# Patient Record
Sex: Female | Born: 1960
Health system: Southern US, Community
[De-identification: ages and names within clinical notes are randomized; demographics above are authoritative.]

## PROBLEM LIST (undated history)

## (undated) DIAGNOSIS — R002 Palpitations: Secondary | ICD-10-CM

## (undated) DIAGNOSIS — M1990 Unspecified osteoarthritis, unspecified site: Secondary | ICD-10-CM

## (undated) DIAGNOSIS — K219 Gastro-esophageal reflux disease without esophagitis: Secondary | ICD-10-CM

## (undated) DIAGNOSIS — C50911 Malignant neoplasm of unspecified site of right female breast: Secondary | ICD-10-CM

## (undated) DIAGNOSIS — Z923 Personal history of irradiation: Secondary | ICD-10-CM

## (undated) DIAGNOSIS — J45909 Unspecified asthma, uncomplicated: Secondary | ICD-10-CM

## (undated) DIAGNOSIS — F32A Depression, unspecified: Secondary | ICD-10-CM

## (undated) DIAGNOSIS — I890 Lymphedema, not elsewhere classified: Secondary | ICD-10-CM

## (undated) DIAGNOSIS — J42 Unspecified chronic bronchitis: Secondary | ICD-10-CM

## (undated) DIAGNOSIS — D649 Anemia, unspecified: Secondary | ICD-10-CM

## (undated) DIAGNOSIS — F419 Anxiety disorder, unspecified: Secondary | ICD-10-CM

## (undated) DIAGNOSIS — Z803 Family history of malignant neoplasm of breast: Secondary | ICD-10-CM

## (undated) DIAGNOSIS — Z9221 Personal history of antineoplastic chemotherapy: Secondary | ICD-10-CM

## (undated) DIAGNOSIS — Z8049 Family history of malignant neoplasm of other genital organs: Secondary | ICD-10-CM

## (undated) DIAGNOSIS — Z9289 Personal history of other medical treatment: Secondary | ICD-10-CM

## (undated) DIAGNOSIS — F329 Major depressive disorder, single episode, unspecified: Secondary | ICD-10-CM

## (undated) DIAGNOSIS — Z8042 Family history of malignant neoplasm of prostate: Secondary | ICD-10-CM

## (undated) DIAGNOSIS — M549 Dorsalgia, unspecified: Secondary | ICD-10-CM

## (undated) HISTORY — DX: Family history of malignant neoplasm of other genital organs: Z80.49

## (undated) HISTORY — DX: Palpitations: R00.2

## (undated) HISTORY — DX: Dorsalgia, unspecified: M54.9

## (undated) HISTORY — DX: Family history of malignant neoplasm of prostate: Z80.42

## (undated) HISTORY — DX: Family history of malignant neoplasm of breast: Z80.3

## (undated) HISTORY — PX: MYOMECTOMY: SHX85

## (undated) HISTORY — PX: ENDOMETRIAL ABLATION: SHX621

## (undated) HISTORY — DX: Lymphedema, not elsewhere classified: I89.0

## (undated) HISTORY — PX: DILATION AND CURETTAGE OF UTERUS: SHX78

---

## 1998-01-27 ENCOUNTER — Emergency Department (HOSPITAL_COMMUNITY): Admission: EM | Admit: 1998-01-27 | Discharge: 1998-01-27 | Payer: Self-pay | Admitting: Emergency Medicine

## 1998-05-24 ENCOUNTER — Ambulatory Visit (HOSPITAL_COMMUNITY)
Admission: RE | Admit: 1998-05-24 | Discharge: 1998-05-24 | Payer: Self-pay | Admitting: Physical Medicine and Rehabilitation

## 1998-07-19 ENCOUNTER — Ambulatory Visit (HOSPITAL_COMMUNITY)
Admission: RE | Admit: 1998-07-19 | Discharge: 1998-07-19 | Payer: Self-pay | Admitting: Physical Medicine and Rehabilitation

## 1998-07-19 ENCOUNTER — Encounter: Payer: Self-pay | Admitting: Physical Medicine and Rehabilitation

## 2000-05-07 ENCOUNTER — Encounter: Admission: RE | Admit: 2000-05-07 | Discharge: 2000-05-07 | Payer: Self-pay

## 2001-09-09 ENCOUNTER — Encounter: Payer: Self-pay | Admitting: Emergency Medicine

## 2001-09-09 ENCOUNTER — Encounter: Admission: RE | Admit: 2001-09-09 | Discharge: 2001-09-09 | Payer: Self-pay | Admitting: Emergency Medicine

## 2003-02-20 ENCOUNTER — Emergency Department (HOSPITAL_COMMUNITY): Admission: EM | Admit: 2003-02-20 | Discharge: 2003-02-20 | Payer: Self-pay | Admitting: Emergency Medicine

## 2003-02-23 ENCOUNTER — Other Ambulatory Visit: Admission: RE | Admit: 2003-02-23 | Discharge: 2003-02-23 | Payer: Self-pay | Admitting: Obstetrics and Gynecology

## 2004-05-14 ENCOUNTER — Ambulatory Visit: Payer: Self-pay | Admitting: Nurse Practitioner

## 2004-06-24 ENCOUNTER — Ambulatory Visit: Payer: Self-pay | Admitting: *Deleted

## 2004-10-03 ENCOUNTER — Ambulatory Visit: Payer: Self-pay | Admitting: Nurse Practitioner

## 2004-11-05 ENCOUNTER — Ambulatory Visit: Payer: Self-pay | Admitting: Internal Medicine

## 2004-11-06 ENCOUNTER — Ambulatory Visit: Payer: Self-pay | Admitting: Nurse Practitioner

## 2004-12-04 ENCOUNTER — Ambulatory Visit: Payer: Self-pay | Admitting: Nurse Practitioner

## 2004-12-20 ENCOUNTER — Ambulatory Visit: Payer: Self-pay | Admitting: Family Medicine

## 2005-01-02 ENCOUNTER — Ambulatory Visit: Payer: Self-pay | Admitting: Family Medicine

## 2005-01-09 ENCOUNTER — Encounter: Admission: RE | Admit: 2005-01-09 | Discharge: 2005-01-09 | Payer: Self-pay | Admitting: Internal Medicine

## 2005-01-24 ENCOUNTER — Encounter: Admission: RE | Admit: 2005-01-24 | Discharge: 2005-01-24 | Payer: Self-pay | Admitting: Internal Medicine

## 2005-06-27 ENCOUNTER — Ambulatory Visit: Payer: Self-pay | Admitting: Nurse Practitioner

## 2005-12-10 ENCOUNTER — Encounter: Payer: Self-pay | Admitting: Physical Medicine and Rehabilitation

## 2005-12-25 ENCOUNTER — Ambulatory Visit: Payer: Self-pay | Admitting: Nurse Practitioner

## 2005-12-29 ENCOUNTER — Ambulatory Visit (HOSPITAL_COMMUNITY): Admission: RE | Admit: 2005-12-29 | Discharge: 2005-12-29 | Payer: Self-pay | Admitting: Nurse Practitioner

## 2006-01-02 ENCOUNTER — Ambulatory Visit: Payer: Self-pay | Admitting: Nurse Practitioner

## 2006-01-07 ENCOUNTER — Other Ambulatory Visit: Admission: RE | Admit: 2006-01-07 | Discharge: 2006-01-07 | Payer: Self-pay | Admitting: *Deleted

## 2006-01-07 ENCOUNTER — Encounter (INDEPENDENT_AMBULATORY_CARE_PROVIDER_SITE_OTHER): Payer: Self-pay | Admitting: Specialist

## 2006-01-07 ENCOUNTER — Ambulatory Visit: Payer: Self-pay | Admitting: *Deleted

## 2006-01-21 ENCOUNTER — Ambulatory Visit: Payer: Self-pay | Admitting: Obstetrics and Gynecology

## 2006-01-27 ENCOUNTER — Ambulatory Visit: Payer: Self-pay | Admitting: Nurse Practitioner

## 2006-01-29 ENCOUNTER — Ambulatory Visit: Payer: Self-pay | Admitting: Obstetrics and Gynecology

## 2006-02-24 ENCOUNTER — Ambulatory Visit: Payer: Self-pay | Admitting: Internal Medicine

## 2006-03-06 ENCOUNTER — Ambulatory Visit: Payer: Self-pay | Admitting: Obstetrics and Gynecology

## 2006-03-06 ENCOUNTER — Ambulatory Visit (HOSPITAL_COMMUNITY): Admission: RE | Admit: 2006-03-06 | Discharge: 2006-03-06 | Payer: Self-pay | Admitting: Obstetrics and Gynecology

## 2006-04-02 ENCOUNTER — Encounter: Admission: RE | Admit: 2006-04-02 | Discharge: 2006-04-02 | Payer: Self-pay | Admitting: Family Medicine

## 2006-07-31 ENCOUNTER — Ambulatory Visit: Payer: Self-pay | Admitting: Nurse Practitioner

## 2006-09-21 ENCOUNTER — Ambulatory Visit: Payer: Self-pay | Admitting: Nurse Practitioner

## 2006-10-30 ENCOUNTER — Ambulatory Visit: Payer: Self-pay | Admitting: Nurse Practitioner

## 2006-12-08 ENCOUNTER — Ambulatory Visit: Payer: Self-pay | Admitting: Nurse Practitioner

## 2007-01-26 ENCOUNTER — Ambulatory Visit: Payer: Self-pay | Admitting: Family Medicine

## 2007-02-24 ENCOUNTER — Encounter (INDEPENDENT_AMBULATORY_CARE_PROVIDER_SITE_OTHER): Payer: Self-pay | Admitting: Nurse Practitioner

## 2007-02-24 ENCOUNTER — Ambulatory Visit: Payer: Self-pay | Admitting: Internal Medicine

## 2007-03-01 ENCOUNTER — Ambulatory Visit (HOSPITAL_COMMUNITY): Admission: RE | Admit: 2007-03-01 | Discharge: 2007-03-01 | Payer: Self-pay | Admitting: Nurse Practitioner

## 2007-03-31 ENCOUNTER — Ambulatory Visit: Payer: Self-pay | Admitting: Obstetrics and Gynecology

## 2007-04-08 ENCOUNTER — Ambulatory Visit (HOSPITAL_COMMUNITY): Admission: RE | Admit: 2007-04-08 | Discharge: 2007-04-08 | Payer: Self-pay | Admitting: Family Medicine

## 2007-04-23 ENCOUNTER — Ambulatory Visit: Payer: Self-pay | Admitting: Obstetrics & Gynecology

## 2007-04-28 ENCOUNTER — Ambulatory Visit: Payer: Self-pay | Admitting: Family Medicine

## 2007-04-28 ENCOUNTER — Encounter (INDEPENDENT_AMBULATORY_CARE_PROVIDER_SITE_OTHER): Payer: Self-pay | Admitting: *Deleted

## 2007-07-02 ENCOUNTER — Ambulatory Visit: Payer: Self-pay | Admitting: Family Medicine

## 2007-09-11 ENCOUNTER — Emergency Department (HOSPITAL_COMMUNITY): Admission: EM | Admit: 2007-09-11 | Discharge: 2007-09-11 | Payer: Self-pay | Admitting: Emergency Medicine

## 2007-09-17 ENCOUNTER — Ambulatory Visit: Payer: Self-pay | Admitting: Internal Medicine

## 2007-09-18 ENCOUNTER — Encounter (INDEPENDENT_AMBULATORY_CARE_PROVIDER_SITE_OTHER): Payer: Self-pay | Admitting: Nurse Practitioner

## 2007-12-01 ENCOUNTER — Ambulatory Visit: Payer: Self-pay | Admitting: Obstetrics and Gynecology

## 2008-02-15 ENCOUNTER — Ambulatory Visit: Payer: Self-pay | Admitting: Obstetrics & Gynecology

## 2008-04-05 ENCOUNTER — Ambulatory Visit: Payer: Self-pay | Admitting: Obstetrics and Gynecology

## 2008-04-05 ENCOUNTER — Encounter: Payer: Self-pay | Admitting: Obstetrics and Gynecology

## 2008-05-15 ENCOUNTER — Ambulatory Visit: Payer: Self-pay | Admitting: Internal Medicine

## 2008-11-03 ENCOUNTER — Ambulatory Visit: Payer: Self-pay | Admitting: Family Medicine

## 2009-02-27 ENCOUNTER — Ambulatory Visit: Payer: Self-pay | Admitting: Family Medicine

## 2009-03-12 ENCOUNTER — Ambulatory Visit (HOSPITAL_COMMUNITY): Admission: RE | Admit: 2009-03-12 | Discharge: 2009-03-12 | Payer: Self-pay | Admitting: Internal Medicine

## 2009-03-13 ENCOUNTER — Ambulatory Visit: Payer: Self-pay | Admitting: Internal Medicine

## 2009-03-21 ENCOUNTER — Ambulatory Visit (HOSPITAL_COMMUNITY): Admission: RE | Admit: 2009-03-21 | Discharge: 2009-03-21 | Payer: Self-pay | Admitting: Family Medicine

## 2009-03-21 ENCOUNTER — Ambulatory Visit: Payer: Self-pay | Admitting: Obstetrics and Gynecology

## 2009-03-21 ENCOUNTER — Encounter: Payer: Self-pay | Admitting: Physician Assistant

## 2009-04-10 ENCOUNTER — Ambulatory Visit: Payer: Self-pay | Admitting: Internal Medicine

## 2009-06-25 ENCOUNTER — Ambulatory Visit: Payer: Self-pay | Admitting: Obstetrics & Gynecology

## 2009-08-15 ENCOUNTER — Ambulatory Visit: Payer: Self-pay | Admitting: Family Medicine

## 2009-09-12 ENCOUNTER — Ambulatory Visit: Payer: Self-pay | Admitting: Obstetrics & Gynecology

## 2009-11-13 ENCOUNTER — Ambulatory Visit: Payer: Self-pay | Admitting: Family Medicine

## 2009-11-15 ENCOUNTER — Ambulatory Visit (HOSPITAL_COMMUNITY): Admission: RE | Admit: 2009-11-15 | Discharge: 2009-11-15 | Payer: Self-pay | Admitting: Internal Medicine

## 2010-03-22 ENCOUNTER — Ambulatory Visit (HOSPITAL_COMMUNITY): Admission: RE | Admit: 2010-03-22 | Discharge: 2010-03-22 | Payer: Self-pay | Admitting: Family Medicine

## 2010-09-01 ENCOUNTER — Encounter: Payer: Self-pay | Admitting: *Deleted

## 2010-09-01 ENCOUNTER — Encounter: Payer: Self-pay | Admitting: Internal Medicine

## 2010-09-02 ENCOUNTER — Encounter: Payer: Self-pay | Admitting: *Deleted

## 2010-12-24 NOTE — Group Therapy Note (Signed)
NAME:  Jacqueline Good, Jacqueline Good NO.:  [id number]   MEDICAL RECORD NO.:  [id number]          PATIENT TYPE:  WOC   LOCATION:  [facility]                   FACILITY:  [facility]   PHYSICIAN:  Argentina Donovan, MD        DATE OF BIRTH:  1960/12/15   DATE OF SERVICE:                                  CLINIC NOTE   HISTORY OF PRESENT ILLNESS:  The patient is a 50 year old gravida 2,  para 1-0-1-1 who last year was referred from [facility] because of  fibroids.  The patient has chronic severe disabling dysmenorrhea that in  talking with her sounds much more as if it were adenomyosis than related  to the fibroids.  Although, over the past year her fibroids have grown  somewhat.  She was scheduled for surgery last year and backed out  because of cost.  She has a fundal pedunculated fibroid about 5 cm in  diameter and left hydrosalpinx.  The uterus is about 10-12 week size and  easily movable.  The patient 1 year ago had an endometrial ablation  because of her bleeding and she has not had any bleeding since, but  cyclically every month, she has a severe disabling pain which I am sure  probably is from the adenomyosis.  We have talked to her in detail about  alternatives.  She seemed to get some good relief after the ablation  when we gave her double Lupron for 3 months, but over the past 6 months  the pain has returned and has been difficult to tolerate and really she  is unable to function during that few days each month.  I have told her  when she was ready to have the surgery we would just have her come in  and discuss, and we will go ahead and schedule her.  I think probably I  would schedule her with Dr. Mia Creek for laparoscopic-assisted vaginal  hysterectomy, possible abdominal hysterectomy.   IMPRESSION:  1. Chronic pelvic pain, cyclic in nature.  2. Uterine fibroids, ultrasound consistent, suggest adenomyosis.           ______________________________  Argentina Donovan, MD     PR/MEDQ  D:  03/31/2007  T:  04/01/2007  Job:  [id number]

## 2010-12-24 NOTE — Group Therapy Note (Signed)
NAME:  Jacqueline Good, MINIUM NO.:  [id number]   MEDICAL RECORD NO.:  [id number]          PATIENT TYPE:  WOC   LOCATION[facility]                   FACILITY:  [facility]   PHYSICIAN:  Argentina Donovan, MD        DATE OF BIRTH:  Sep 04, 1960   DATE OF SERVICE:  04/05/2008                                  CLINIC NOTE   The patient is a 50 year old gravida 2, para 1-0-1-1 who 2 years ago  underwent endometrial ablation for dysfunctional uterine bleeding.  Was  known to have some fibroids as well as the symptoms of adenomyosis,  which were suggested by her ultrasound.  She continued having some  heavier bleeding as well as severe pain following the procedure and was  placed on Depo-Provera, which has to some degree controlled the  bleeding.  She does continue to spot almost every day or every other  day, and every few months has severe pain.  I think that the only  answered to this lady to resolve her problem would be to do a  hysterectomy.  She is not ready to go through that at this time,  although at times she rates her pain a 9.  We have told her we will  continue the Depo-Provera.  She is a smoker so oral contraceptives are  out of the question, and she wanted something to control pain.  We are  putting her on generic Tramadol, generic Ultram because of the cheap  price that she can get it for at [employer].   EXAMINATION:  Her breasts are symmetrical with no dominant masses,  pendulant with no nipple discharge.  ABDOMEN:  Soft, flat, nontender.  No masses or organomegaly.  EXTERNAL GENITALIA:  Is normal.  BUS is within normal limits.  Vagina is  clean, well rugated.  Cervix clean, parous.  Uterus anterior, slightly  enlarged about 12 weeks size and somewhat irregular.  The adnexa could  not be outlined because of habitus of the patient.  She was 187 pounds  and 5 feet 6 inches tall.  I have told her that if she decides  hysterectomy [place] what she wants, to give [place] a call.  We will go  ahead and  think about getting this scheduled, and then she will come in for a  consultation when she is going to be scheduled.   IMPRESSION:  Chronic pelvic pain with vaginal spotting and probably  adenomyosis.  Pap smear was taken.  Mammogram has been ordered.           ______________________________  Argentina Donovan, MD     PR/MEDQ  D:  04/05/2008  T:  04/05/2008  Job:  [id number]

## 2010-12-24 NOTE — Group Therapy Note (Signed)
NAMEMarland Good  LACRETIA, TINDALL NO.:  [id number]   MEDICAL RECORD NO.:  [id number]          PATIENT TYPE:  WOC   [facility]ATION[facility]                   [facility]ACILITY:  [facility]   PHYSICIAN:  Argentina Donovan, MD        DATE OF BIRTH:  01-11-1961   DATE OF SERVICE:  12/01/2007                                  CLINIC NOTE   The patient is a 50 year old gravida 2, para 1-0-1-1, who recently was  referred to [place] because of fibroids and disabling dysmenorrhea.  Approximately a year ago, she had an endometrial ablation which cut back  on the heavy periods she was having and still has a uterus that measures  about 12 weeks size and is somewhat irregular with and pedunculated  fibroid.  This has not really enlarged much since we began seeing her.  She does not have the heavy bleeding anymore, but she still has the pain  and cramping every month when she would normally have a period.  Last  year, we gave her a shot of Depo-Provera which really helped, and I  think that we could probably continue that since she is uninsured and  does not really want to end up doing surgery.  She said she was  comfortable with that.  If we keep her on that until menopause, we are  probably going to at least alleviate her pain, unless the fibroids start  growing unexpectedly.  So, we are going to give her 150 mg of Depo-  Provera IM and continue that every 3 months unless her condition  warrants further intervention.   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           ______________________________  Argentina Donovan, MD     PR/MEDQ  D:  12/01/2007  T:  12/01/2007  Job:  [id number]

## 2010-12-27 NOTE — Group Therapy Note (Signed)
NAMEMarland Kitchen  Jacqueline Good, Jacqueline Good NO.:  [id number]   MEDICAL RECORD NO.:  [id number]          PATIENT TYPE:  WOC   LOCATION[facility]                   [facility]ACILITY[facility]   PHYSICIAN:  Tinnie Gens, MD        DATE OF BIRTH:  [birth year]   DATE OF SERVICE:                                    CLINIC NOTE   CHIEF COMPLAINT:  Fibroids.   HISTORY OF PRESENT ILLNESS:  The patient is a 50 year old gravida 2, para 1-  0-1-1 who is referred from [facility] for fibroids.  The patient states she  has known about the fibroids for approximately 3 years and that they were  small and sort of in the posterior cervix.  She reports that she has some  pain that was previously very severe, but it has been decreasing recently.  She notes the pain mostly right before her cycles and with her cycles.  Her  last Pap smear was in May of this year.  She is scheduled for a mammogram in  June of this year.  The patient reports that she still has monthly cycles.  She has got medium flow that lasts 5 days with severe pain.   PAST MEDICAL HISTORY:  Asthma, and allergic rhinitis.   PAST SURGICAL HISTORY:  Negative.   ALLERGIES:  None known.   MEDICATIONS:  Allegra, naproxen, and albuterol.   OBSTETRICAL HISTORY:  G2, P1, one vaginal delivery.   GYNECOLOGICAL HISTORY:  Menarche at age 66.  Cycles every month.  Condoms  for birth control.  No history of abnormal Pap.   FAMILY HISTORY:  Diabetes, coronary artery disease, hypertension, and lupus.   SOCIAL HISTORY:  One-pack-per-day smoker for the last 22 years, one 6 pack  per week of alcohol.  No other drug use.   REVIEW OF SYSTEMS:  A 14-point review of systems was reviewed, and is  positive for bruising, muscle aches, fatigue, problems with hearing in her  ears.   PHYSICAL EXAMINATION:  VITAL SIGNS:  Her vital signs are as noted on the  chart.  GENERAL:  She is a well-developed, well-nourished black female in no acute  distress.  ABDOMEN:  Soft,  nontender, nondistended.  GU:  She has normal external female genitalia.  The vagina is pink and  rugated.  The cervix is without lesion.  The uterus is firm, approximately 6-  8 weeks' size.  The adnexa were without mass or tenderness.   IMPRESSION:  Fibroid uterus.   PLAN:  Discussed endometrial biopsy with the patient today.  She is clearly  not ready for this.  We will revisit this discussion when she returns.  Pelvis ultrasound is scheduled to evaluate placement and size of fibroids  and a frank discussion about possible treatments.  The patient is interested  in uterine artery embolization.  However, she has no health insurance, and  may not be a good candidate for this as it costs 10,000 dollars.      TP/MEDQ  D:  01/02/2005  T:  01/02/2005  Job:  [id number]

## 2010-12-27 NOTE — Group Therapy Note (Signed)
NAME:  Jacqueline Good, Jacqueline Good NO.:  [id number]   MEDICAL RECORD NO.:  [id number]          PATIENT TYPE:  WOC   [facility]ATION[facility]                   [facility]ACILITY[facility]   PHYSICIAN:  Argentina Donovan, MD        DATE OF BIRTH:  02-14-1961   DATE OF SERVICE:  01/21/2006                                    CLINIC NOTE   The patient is a 50 year old, gravida 1, para 1-0-0-1, with a three-day  history of progressive dysmenorrhea and abnormal uterine bleeding. The last  time her blood count was checked her hemoglobin was 6.5 and hematocrit 25.  A pelvic ultrasound was normal except for some small fibroids. She came in  after having seeing Dr. Lauralee Evener for a consultation. He discussed with her  the possibility of having a hysterectomy. She wanted to discuss that. I told  her that she would get a probable diagnosis of adenomyosis and that it would  be very  unlikely that endometrial ablation would help. However, we will try  that for her and may control some of the bleeding, but it will not control  the pain, and that hysterectomy is probably the only certain reason it will  stop and the Depo-Lupron that he has ordered to give her as a trial has not  arrived as yet. The patient is wanting something done quickly. She is still  bleeding. She has only seasonal allergies.   She is on Allegra, Paxil, albuterol, and Naprosyn.   We will set her up for her hydroablation as soon as possible.           ______________________________  Argentina Donovan, MD     PR/MEDQ  D:  01/21/2006  T:  01/21/2006  Job:  [id number]

## 2010-12-27 NOTE — Op Note (Signed)
Jacqueline Good, RETTINGER               ACCOUNT NO.:  [id number]   MEDICAL RECORD NO.:  [id number]          PATIENT TYPE:  AMB   LOCATION:  [facility]                           [facility]   PHYSICIAN:  Phil D. Okey Dupre, M.D.     DATE OF BIRTH:  Jul 15, 1961   DATE OF PROCEDURE:  03/06/2006  DATE OF DISCHARGE:                                 OPERATIVE REPORT   PROCEDURE:  Hysteroscopy and hydroablation of the endometrium.   PREOPERATIVE DIAGNOSES:  Uterine fibroids, menometrorrhagia intractable, and  anemia.   SURGEON:  Javier Glazier. Okey Dupre, M.D.   ANESTHESIA:  General.   ESTIMATED BLOOD LOSS:  Negligible.   POSTOPERATIVE CONDITION:  Satisfactory.   PATHOLOGY SPECIMENS:  None.   POSTOPERATIVE CONDITION:  Satisfactory.   The procedure went as follows:   Under satisfactory general anesthesia with the patient in the dorsal  lithotomy position, the perineum and vagina were prepped and draped in the  usual sterile manner.  Bimanual pelvic examination under anesthesia revealed  the uterus was anterior, normal size, shape, consistency, with what felt  like a leiomyomata in the area of the right broad ligament measuring about 4  cm in diameter.  The adnexa could not be well outlined.  A weighted speculum  was placed in the posterior portion of the vagina through a marital  introitus.  BUS within normal limits.  Vagina was clean and well rugated.  The cervix was parous and clean.  The cervix came down midway to the  introitus when attached by a single-tooth tenaculum.  The uterine cavity was  sounded to a depth of 8 cm.  The cervical os dilated to a #23 Pratt dilator.  An 8-mm 30-degree hysteroscope attached to the hydroablation unit was  inserted to the fundus.  The entire uterine cavity was well explored.  Both  of the ostia were seen as well as submucous fibroids within the cavity.  The  instrument was checked for any leaks.  The protocol for hydroablation was  carried out.  Once we reached  temperature, a 10-minute ablation procedure  followed, followed by a 2-minute cooling off period.  The uterine cavity was  then re-explored and found to be completely blanched even behind the little  fibroid tumors which had shrunken down considerably.  The scope was then  removed, the tenaculum and speculum removed, and the patient transferred to  recovery room in satisfactory condition having tolerated the procedure well.           ______________________________  Javier Glazier. Okey Dupre, M.D.    PDR/MEDQ  D:  03/06/2006  T:  03/06/2006  Job:  [id number]

## 2010-12-27 NOTE — Group Therapy Note (Signed)
NAME:  Jacqueline Good, SALVO NO.:  [id number]   MEDICAL RECORD NO.:  [id number]          PATIENT TYPE:  WOC   [facility]ATION[facility]                   [facility]   PHYSICIAN:  Ellis Parents, MD    DATE OF BIRTH:  02-25-1961   DATE OF SERVICE:  01/07/2006                                    CLINIC NOTE   This 50 year old multiparous female has a three year history of progressive  dysmenorrhea and abnormal uterine bleeding of approximately six to eight  months duration. Her bleeding episodes have been particularly heavy lasting  six to seven days requiring double pads with the passage of clots. The  patient was seen in [facility] yesterday. Hemoglobin was 6.5 gm and hematocrit 25%.  Pelvic ultrasound was normal except for a small fibroid of 2.3 cm. The  dysmenorrhea is becoming incapacitating.   PHYSICAL EXAMINATION:  ABDOMEN: Soft and nontender.  PELVIC EXAM: External genitalia are normal. The vagina is clean. The cervix  is well epithelialized and normal size. The uterus appears normal size and  is nontender. Both adnexa are soft without induration, masses, or  nodularity. The uterus sounds to 9 cm in depth and an endometrial type  biopsy is performed without significant difficulty.   INITIAL IMPRESSION:  Adenomyosis.   The patient will be given Depot-Lupron 7.5 mg and then started on ferrous 35  mg b.i.d. She is to return in two weeks. The patient does not relish the  idea of vaginal hysterectomy and was given other alternatives, although if  adenomyosis is indeed correct the other alternatives would not be  appropriate at this point unless the patient refuses hysterectomy. The  patient is to return in two weeks for follow-up.           ______________________________  Ellis Parents, MD     SA/MEDQ  D:  01/07/2006  T:  01/07/2006  Job:  [id number]

## 2010-12-30 ENCOUNTER — Other Ambulatory Visit (HOSPITAL_COMMUNITY): Payer: Self-pay | Admitting: Family Medicine

## 2010-12-30 ENCOUNTER — Ambulatory Visit (HOSPITAL_COMMUNITY)
Admission: RE | Admit: 2010-12-30 | Discharge: 2010-12-30 | Disposition: A | Discharge status: Home / Self Care | Payer: Self-pay | Source: Ambulatory Visit | Attending: Family Medicine | Admitting: Family Medicine

## 2010-12-30 DIAGNOSIS — M25569 Pain in unspecified knee: Secondary | ICD-10-CM | POA: Insufficient documentation

## 2010-12-30 DIAGNOSIS — M25561 Pain in right knee: Secondary | ICD-10-CM

## 2010-12-30 DIAGNOSIS — M25562 Pain in left knee: Secondary | ICD-10-CM

## 2011-03-03 ENCOUNTER — Ambulatory Visit: Payer: Self-pay | Admitting: Family Medicine

## 2011-05-08 LAB — POCT PREGNANCY, URINE: Operator id: 120861

## 2011-05-21 ENCOUNTER — Other Ambulatory Visit (HOSPITAL_COMMUNITY): Payer: Self-pay | Admitting: Family Medicine

## 2011-05-21 DIAGNOSIS — Z1231 Encounter for screening mammogram for malignant neoplasm of breast: Secondary | ICD-10-CM

## 2011-06-05 ENCOUNTER — Ambulatory Visit (HOSPITAL_COMMUNITY): Payer: Self-pay

## 2011-06-06 ENCOUNTER — Ambulatory Visit: Payer: Self-pay

## 2011-06-17 ENCOUNTER — Ambulatory Visit (HOSPITAL_COMMUNITY): Payer: Self-pay

## 2011-06-18 ENCOUNTER — Other Ambulatory Visit: Payer: Self-pay | Admitting: Family Medicine

## 2011-06-18 DIAGNOSIS — Z1231 Encounter for screening mammogram for malignant neoplasm of breast: Secondary | ICD-10-CM

## 2011-07-14 ENCOUNTER — Ambulatory Visit
Admission: RE | Admit: 2011-07-14 | Discharge: 2011-07-14 | Disposition: A | Discharge status: Home / Self Care | Payer: Self-pay | Source: Ambulatory Visit | Attending: Family Medicine | Admitting: Family Medicine

## 2011-07-14 DIAGNOSIS — Z1231 Encounter for screening mammogram for malignant neoplasm of breast: Secondary | ICD-10-CM

## 2011-07-16 ENCOUNTER — Ambulatory Visit (HOSPITAL_COMMUNITY): Payer: Self-pay | Attending: Family Medicine

## 2011-07-29 ENCOUNTER — Other Ambulatory Visit: Payer: Self-pay | Admitting: Family Medicine

## 2011-10-24 ENCOUNTER — Ambulatory Visit: Payer: Self-pay | Admitting: Advanced Practice Midwife

## 2011-12-30 ENCOUNTER — Ambulatory Visit: Payer: Self-pay

## 2012-05-24 ENCOUNTER — Other Ambulatory Visit: Payer: Self-pay | Admitting: Family Medicine

## 2012-05-24 DIAGNOSIS — Z1231 Encounter for screening mammogram for malignant neoplasm of breast: Secondary | ICD-10-CM

## 2012-07-29 ENCOUNTER — Telehealth (HOSPITAL_COMMUNITY): Payer: Self-pay | Admitting: *Deleted

## 2012-07-29 NOTE — Telephone Encounter (Signed)
Telephoned patient and left message to return call to [facility] 

## 2012-11-26 ENCOUNTER — Telehealth (HOSPITAL_COMMUNITY): Payer: Self-pay | Admitting: *Deleted

## 2012-11-26 NOTE — Telephone Encounter (Signed)
Telephoned patient at home # and left message to return call to [facility] 

## 2012-12-17 ENCOUNTER — Other Ambulatory Visit: Payer: Self-pay | Admitting: Obstetrics and Gynecology

## 2012-12-17 DIAGNOSIS — Z1231 Encounter for screening mammogram for malignant neoplasm of breast: Secondary | ICD-10-CM

## 2012-12-28 ENCOUNTER — Ambulatory Visit (HOSPITAL_COMMUNITY)
Admission: RE | Admit: 2012-12-28 | Discharge: 2012-12-28 | Disposition: A | Discharge status: Home / Self Care | Payer: Self-pay | Source: Ambulatory Visit | Attending: Obstetrics and Gynecology | Admitting: Obstetrics and Gynecology

## 2012-12-28 ENCOUNTER — Encounter (HOSPITAL_COMMUNITY): Payer: Self-pay

## 2012-12-28 VITALS — BP 118/72 | Temp 98.1°F | Ht 65.0 in | Wt 215.2 lb

## 2012-12-28 DIAGNOSIS — Z1231 Encounter for screening mammogram for malignant neoplasm of breast: Secondary | ICD-10-CM

## 2012-12-28 DIAGNOSIS — Z01419 Encounter for gynecological examination (general) (routine) without abnormal findings: Secondary | ICD-10-CM

## 2012-12-28 HISTORY — DX: Major depressive disorder, single episode, unspecified: F32.9

## 2012-12-28 HISTORY — DX: Depression, unspecified: F32.A

## 2012-12-28 HISTORY — DX: Unspecified asthma, uncomplicated: J45.909

## 2012-12-28 NOTE — Patient Instructions (Signed)
Taught patient how to perform BSE. Let her know [facility] will cover Pap smears every 3 years unless has a history of abnormal Pap smears. Let patient know will follow up with her within the next couple weeks with results by letter or phone. Patient verbalized understanding. Patient escorted to mammography for a screening mammogram. 

## 2012-12-28 NOTE — Progress Notes (Signed)
No complaints today.  Pap Smear:    Completed Pap smear today. Last Pap smear was 03/21/2009 at the [facility] and normal. Per patient no history of an abnormal Pap smear. Last 4 Pap smear results in EPIC.  Physical exam: Breasts Breasts symmetrical. No skin abnormalities bilateral breasts. No nipple retraction left breast. Slight nipple retraction right breast per patient is normal for her. No nipple discharge bilateral breasts. No lymphadenopathy. No lumps palpated bilateral breasts. Patient escorted to mammography for a screening mammogram.         Pelvic/Bimanual   Ext Genitalia No lesions, no swelling and no discharge observed on external genitalia.         Vagina Vagina pink and normal texture. No lesions or discharge observed in vagina.          Cervix Cervix is present. Cervix pink and of normal texture. Small amount of white creamy discharge on cervical os.          Uterus Uterus is present and palpable. Uterus in normal position and normal size.       Adnexae Bilateral ovaries present and unable to palpate. No tenderness on palpation.        Rectovaginal No rectal exam completed today since patient had no rectal complaints. No skin abnormalities observed on rectal area.

## 2013-01-12 ENCOUNTER — Telehealth (HOSPITAL_COMMUNITY): Payer: Self-pay | Admitting: *Deleted

## 2013-01-12 NOTE — Telephone Encounter (Signed)
Telephoned home # and sounded like fax machine picked up. Unable to leave message.

## 2013-01-13 ENCOUNTER — Telehealth (HOSPITAL_COMMUNITY): Payer: Self-pay | Admitting: *Deleted

## 2013-01-13 NOTE — Telephone Encounter (Signed)
Patient returned call. Jacqueline Good discussed results of negative mammogram and negative pap smear. Next pap due in 3 years. Patient voiced understanding.

## 2013-01-17 ENCOUNTER — Telehealth (HOSPITAL_COMMUNITY): Payer: Self-pay | Admitting: *Deleted

## 2013-01-17 NOTE — Telephone Encounter (Signed)
Telephoned patient at home # and discussed results of negative pap smear. Next pap due in 3 years. Patient voiced understanding. 

## 2013-05-06 ENCOUNTER — Ambulatory Visit: Payer: Self-pay | Attending: Internal Medicine

## 2013-05-30 ENCOUNTER — Ambulatory Visit: Payer: Self-pay

## 2013-06-10 ENCOUNTER — Encounter: Payer: Self-pay | Admitting: Internal Medicine

## 2013-06-10 ENCOUNTER — Ambulatory Visit: Payer: No Typology Code available for payment source | Attending: Internal Medicine | Admitting: Internal Medicine

## 2013-06-10 VITALS — BP 132/89 | HR 60 | Temp 98.7°F | Resp 16 | Ht 64.0 in | Wt 220.0 lb

## 2013-06-10 DIAGNOSIS — T7840XA Allergy, unspecified, initial encounter: Secondary | ICD-10-CM

## 2013-06-10 DIAGNOSIS — J45901 Unspecified asthma with (acute) exacerbation: Secondary | ICD-10-CM

## 2013-06-10 DIAGNOSIS — J441 Chronic obstructive pulmonary disease with (acute) exacerbation: Secondary | ICD-10-CM | POA: Insufficient documentation

## 2013-06-10 DIAGNOSIS — R5383 Other fatigue: Secondary | ICD-10-CM

## 2013-06-10 DIAGNOSIS — R5381 Other malaise: Secondary | ICD-10-CM

## 2013-06-10 LAB — CBC WITH DIFFERENTIAL/PLATELET
Eosinophils Absolute: 0.2 10*3/uL (ref 0.0–0.7)
Eosinophils Relative: 3 % (ref 0–5)
HCT: 41.4 % (ref 36.0–46.0)
Lymphocytes Relative: 20 % (ref 12–46)
Lymphs Abs: 1.7 10*3/uL (ref 0.7–4.0)
MCH: 28.2 pg (ref 26.0–34.0)
MCHC: 33.6 g/dL (ref 30.0–36.0)
MCV: 84 fL (ref 78.0–100.0)
Monocytes Absolute: 0.5 10*3/uL (ref 0.1–1.0)
Monocytes Relative: 5 % (ref 3–12)
Platelets: 308 10*3/uL (ref 150–400)
RBC: 4.93 MIL/uL (ref 3.87–5.11)
WBC: 8.5 10*3/uL (ref 4.0–10.5)

## 2013-06-10 LAB — COMPREHENSIVE METABOLIC PANEL
AST: 13 U/L (ref 0–37)
Albumin: 4.4 g/dL (ref 3.5–5.2)
Alkaline Phosphatase: 87 U/L (ref 39–117)
BUN: 13 mg/dL (ref 6–23)
Calcium: 9.7 mg/dL (ref 8.4–10.5)
Chloride: 106 mEq/L (ref 96–112)
Potassium: 4.7 mEq/L (ref 3.5–5.3)
Total Bilirubin: 0.5 mg/dL (ref 0.3–1.2)
Total Protein: 7.2 g/dL (ref 6.0–8.3)

## 2013-06-10 MED ORDER — ALBUTEROL SULFATE HFA 108 (90 BASE) MCG/ACT IN AERS: 2.0000 | INHALATION_SPRAY | Freq: Four times a day (QID) | RESPIRATORY_TRACT | Status: DC | PRN

## 2013-06-10 MED ORDER — FUROSEMIDE 20 MG PO TABS: 20.0000 mg | ORAL_TABLET | Freq: Every day | ORAL | Status: DC

## 2013-06-10 MED ORDER — FLUTICASONE-SALMETEROL 250-50 MCG/DOSE IN AEPB: 1.0000 | INHALATION_SPRAY | Freq: Two times a day (BID) | RESPIRATORY_TRACT | Status: DC

## 2013-06-10 MED ORDER — NAPROXEN 500 MG PO TABS: 500.0000 mg | ORAL_TABLET | Freq: Two times a day (BID) | ORAL | Status: DC

## 2013-06-10 MED ORDER — LORATADINE 10 MG PO TABS: 10.0000 mg | ORAL_TABLET | Freq: Every day | ORAL | Status: DC

## 2013-06-10 NOTE — Progress Notes (Signed)
Patient ID: Jacqueline Good, female   DOB: [birth year], 52 y.o.   MRN: [id number] Patient Demographics  Jacqueline Good, is a 52 y.o. female  ION:[id number]  KGM:[id number]  DOB - [birth year]  CC:  Chief Complaint  Patient presents with  . Establish Care       HPI: Jacqueline Good is a 52 y.o. female here today to establish medical care. She has medical history of asthma, allergy and depression. She was on loratadine and albuterol inhaler but has run out of this medication for long, no asthma and allergy are acting up and she needs to be back on his medications. She also noticed some fluid retention and she takes Lasix occasionally. She is not known to have hypertension or diabetes, she does not take medication for any other medical condition, she smoke about half a pack a day, she drinks alcohol occasionally. She has family history of heart disease in a sister and SLE in another sister Patient has No headache, No chest pain, No abdominal pain - No Nausea, No new weakness tingling or numbness, No Cough - SOB.  No Known Allergies Past Medical History  Diagnosis Date  . Asthma   . Depression    No current outpatient prescriptions on file prior to visit.   No current facility-administered medications on file prior to visit.   Family History  Problem Relation Age of Onset  . Lupus Sister   . Heart disease Sister    History   Social History  . Marital Status: Single    Spouse Name: N/A    Number of Children: N/A  . Years of Education: N/A   Occupational History  . Not on file.   Social History Main Topics  . Smoking status: Current Every Day Smoker -- 0.50 packs/day for 20 years    Types: Cigarettes  . Smokeless tobacco: Never Used  . Alcohol Use: Yes     Comment: occassionally  . Drug Use: No  . Sexual Activity: Not Currently   Other Topics Concern  . Not on file   Social History Narrative  . No narrative on file    Review of Systems: Constitutional: Negative for  fever, chills, diaphoresis, activity change, appetite change and fatigue. HENT: Negative for ear pain, nosebleeds, congestion, facial swelling, rhinorrhea, neck pain, neck stiffness and ear discharge.  Eyes: Negative for pain, discharge, redness, itching and visual disturbance. Respiratory: Negative for cough, choking, chest tightness, shortness of breath, wheezing and stridor.  Cardiovascular: Negative for chest pain, palpitations and leg swelling. Gastrointestinal: Negative for abdominal distention. Genitourinary: Negative for dysuria, urgency, frequency, hematuria, flank pain, decreased urine volume, difficulty urinating and dyspareunia.  Musculoskeletal: Negative for back pain, joint swelling, arthralgia and gait problem. Neurological: Negative for dizziness, tremors, seizures, syncope, facial asymmetry, speech difficulty, weakness, light-headedness, numbness and headaches.  Hematological: Negative for adenopathy. Does not bruise/bleed easily. Psychiatric/Behavioral: Negative for hallucinations, behavioral problems, confusion, dysphoric mood, decreased concentration and agitation.    Objective:   Filed Vitals:   06/10/13 1138  BP: 132/89  Pulse: 60  Temp: 98.7 F (37.1 C)  Resp: 16    Physical Exam: Constitutional: Patient appears well-developed and well-nourished. No distress. HENT: Normocephalic, atraumatic, External right and left ear normal. Oropharynx is clear and moist.  Eyes: Conjunctivae and EOM are normal. PERRLA, no scleral icterus. Neck: Normal ROM. Neck supple. No JVD. No tracheal deviation. No thyromegaly. CVS: RRR, S1/S2 +, no murmurs, no gallops, no carotid bruit.  Pulmonary: Effort and breath sounds normal, no  stridor, rhonchi, wheezes, rales.  Abdominal: Soft. BS +, no distension, tenderness, rebound or guarding.  Musculoskeletal: Normal range of motion. No edema and no tenderness.  Lymphadenopathy: No lymphadenopathy noted, cervical, inguinal or axillary Neuro:  Alert. Normal reflexes, muscle tone coordination. No cranial nerve deficit. Skin: Skin is warm and dry. No rash noted. Not diaphoretic. No erythema. No pallor. Psychiatric: Normal mood and affect. Behavior, judgment, thought content normal.  No results found for this basename: WBC, HGB, HCT, MCV, PLT   No results found for this basename: CREATININE, BUN, NA, K, CL, CO2    No results found for this basename: HGBA1C   Lipid Panel  No results found for this basename: chol, trig, hdl, cholhdl, vldl, ldlcalc       Assessment and plan:   Patient Active Problem List   Diagnosis Date Noted  . Fatigue 06/10/2013  . Morbid obesity 06/10/2013  . Asthma, chronic obstructive, with acute exacerbation 06/10/2013  . Allergy 06/10/2013   Rapid strep test negative  Plan: Asthma: Moderate persistent Albuterol inhaler every 6 hour when necessary Advair Diskus 250/50 mcg per dose twice a day Loratadine 10 mg tablet by mouth daily Naproxen 500 mg tablet by mouth twice a day for pain  Labs: Comprehensive metabolic panel CBC D. Hemoglobin A1c Complete urinalysis Lipid panel  Patient has been extensively counseled about nutrition and exercise Patient has been counseled about smoking cessation Patient was given resources for asthma on smoking cessation     Follow up in 3 months or when necessary   The patient was given clear instructions to go to ER or return to medical center if symptoms don't improve, worsen or new problems develop. The patient verbalized understanding. The patient was told to call to get lab results if they haven't heard anything in the next week.     Jeanann Lewandowsky, MD, MHA, [facility] [place], [place] [phone]   06/10/2013, 11:58 AM

## 2013-06-10 NOTE — Patient Instructions (Signed)
Fatigue Fatigue is a feeling of tiredness, lack of energy, lack of motivation, or feeling tired all the time. Having enough rest, good nutrition, and reducing stress will normally reduce fatigue. Consult your caregiver if it persists. The nature of your fatigue will help your caregiver to find out its cause. The treatment is based on the cause.  CAUSES  There are many causes for fatigue. Most of the time, fatigue can be traced to one or more of your habits or routines. Most causes fit into one or more of three general areas. They are: Lifestyle problems  Sleep disturbances.  Overwork.  Physical exertion.  Unhealthy habits.  Poor eating habits or eating disorders.  Alcohol and/or drug use .  Lack of proper nutrition (malnutrition). Psychological problems  Stress and/or anxiety problems.  Depression.  Grief.  Boredom. Medical Problems or Conditions  Anemia.  Pregnancy.  Thyroid gland problems.  Recovery from major surgery.  Continuous pain.  Emphysema or asthma that is not well controlled  Allergic conditions.  Diabetes.  Infections (such as mononucleosis).  Obesity.  Sleep disorders, such as sleep apnea.  Heart failure or other heart-related problems.  Cancer.  Kidney disease.  Liver disease.  Effects of certain medicines such as antihistamines, cough and cold remedies, prescription pain medicines, heart and blood pressure medicines, drugs used for treatment of cancer, and some antidepressants. SYMPTOMS  The symptoms of fatigue include:   Lack of energy.  Lack of drive (motivation).  Drowsiness.  Feeling of indifference to the surroundings. DIAGNOSIS  The details of how you feel help guide your caregiver in finding out what is causing the fatigue. You will be asked about your present and past health condition. It is important to review all medicines that you take, including prescription and non-prescription items. A thorough exam will be done.  You will be questioned about your feelings, habits, and normal lifestyle. Your caregiver may suggest blood tests, urine tests, or other tests to look for common medical causes of fatigue.  TREATMENT  Fatigue is treated by correcting the underlying cause. For example, if you have continuous pain or depression, treating these causes will improve how you feel. Similarly, adjusting the dose of certain medicines will help in reducing fatigue.  HOME CARE INSTRUCTIONS   Try to get the required amount of good sleep every night.  Eat a healthy and nutritious diet, and drink enough water throughout the day.  Practice ways of relaxing (including yoga or meditation).  Exercise regularly.  Make plans to change situations that cause stress. Act on those plans so that stresses decrease over time. Keep your work and personal routine reasonable.  Avoid street drugs and minimize use of alcohol.  Start taking a daily multivitamin after consulting your caregiver. SEEK MEDICAL CARE IF:   You have persistent tiredness, which cannot be accounted for.  You have fever.  You have unintentional weight loss.  You have headaches.  You have disturbed sleep throughout the night.  You are feeling sad.  You have constipation.  You have dry skin.  You have gained weight.  You are taking any new or different medicines that you suspect are causing fatigue.  You are unable to sleep at night.  You develop any unusual swelling of your legs or other parts of your body. SEEK IMMEDIATE MEDICAL CARE IF:   You are feeling confused.  Your vision is blurred.  You feel faint or pass out.  You develop severe headache.  You develop severe abdominal, pelvic, or   back pain.  You develop chest pain, shortness of breath, or an irregular or fast heartbeat.  You are unable to pass a normal amount of urine.  You develop abnormal bleeding such as bleeding from the rectum or you vomit blood.  You have thoughts  about harming yourself or committing suicide.  You are worried that you might harm someone else. MAKE SURE YOU:   Understand these instructions.  Will watch your condition.  Will get help right away if you are not doing well or get worse. Document Released: 05/25/2007 Document Revised: 10/20/2011 Document Reviewed: 05/25/2007 [facility] Patient Information 2014 [place], [place]. Smoking Cessation Quitting smoking is important to your health and has many advantages. However, it is not always easy to quit since nicotine is a very addictive drug. Often times, people try 3 times or more before being able to quit. This document explains the best ways for you to prepare to quit smoking. Quitting takes hard work and a lot of effort, but you can do it. ADVANTAGES OF QUITTING SMOKING  You will live longer, feel better, and live better.  Your body will feel the impact of quitting smoking almost immediately.  Within 20 minutes, blood pressure decreases. Your pulse returns to its normal level.  After 8 hours, carbon monoxide levels in the blood return to normal. Your oxygen level increases.  After 24 hours, the chance of having a heart attack starts to decrease. Your breath, hair, and body stop smelling like smoke.  After 48 hours, damaged nerve endings begin to recover. Your sense of taste and smell improve.  After 72 hours, the body is virtually free of nicotine. Your bronchial tubes relax and breathing becomes easier.  After 2 to 12 weeks, lungs can hold more air. Exercise becomes easier and circulation improves.  The risk of having a heart attack, stroke, cancer, or lung disease is greatly reduced.  After 1 year, the risk of coronary heart disease is cut in half.  After 5 years, the risk of stroke falls to the same as a nonsmoker.  After 10 years, the risk of lung cancer is cut in half and the risk of other cancers decreases significantly.  After 15 years, the risk of coronary heart  disease drops, usually to the level of a nonsmoker.  If you are pregnant, quitting smoking will improve your chances of having a healthy baby.  The people you live with, especially any children, will be healthier.  You will have extra money to spend on things other than cigarettes. QUESTIONS TO THINK ABOUT BEFORE ATTEMPTING TO QUIT You may want to talk about your answers with your caregiver.  Why do you want to quit?  If you tried to quit in the past, what helped and what did not?  What will be the most difficult situations for you after you quit? How will you plan to handle them?  Who can help you through the tough times? Your family? Friends? A caregiver?  What pleasures do you get from smoking? What ways can you still get pleasure if you quit? Here are some questions to ask your caregiver:  How can you help me to be successful at quitting?  What medicine do you think would be best for me and how should I take it?  What should I do if I need more help?  What is smoking withdrawal like? How can I get information on withdrawal? GET READY  Set a quit date.  Change your environment by getting rid of  all cigarettes, ashtrays, matches, and lighters in your home, car, or work. Do not let people smoke in your home.  Review your past attempts to quit. Think about what worked and what did not. GET SUPPORT AND ENCOURAGEMENT You have a better chance of being successful if you have help. You can get support in many ways.  Tell your family, friends, and co-workers that you are going to quit and need their support. Ask them not to smoke around you.  Get individual, group, or telephone counseling and support. Programs are available at [employer] and health centers. Call your local health department for information about programs in your area.  Spiritual beliefs and practices may help some smokers quit.  Download a "quit meter" on your computer to keep track of quit statistics, such  as how long you have gone without smoking, cigarettes not smoked, and money saved.  Get a self-help book about quitting smoking and staying off of tobacco. LEARN NEW SKILLS AND BEHAVIORS  Distract yourself from urges to smoke. Talk to someone, go for a walk, or occupy your time with a task.  Change your normal routine. Take a different route to work. Drink tea instead of coffee. Eat breakfast in a different place.  Reduce your stress. Take a hot bath, exercise, or read a book.  Plan something enjoyable to do every day. Reward yourself for not smoking.  Explore interactive web-based programs that specialize in helping you quit. GET MEDICINE AND USE IT CORRECTLY Medicines can help you stop smoking and decrease the urge to smoke. Combining medicine with the above behavioral methods and support can greatly increase your chances of successfully quitting smoking.  Nicotine replacement therapy helps deliver nicotine to your body without the negative effects and risks of smoking. Nicotine replacement therapy includes nicotine gum, lozenges, inhalers, nasal sprays, and skin patches. Some may be available over-the-counter and others require a prescription.  Antidepressant medicine helps people abstain from smoking, but how this works is unknown. This medicine is available by prescription.  Nicotinic receptor partial agonist medicine simulates the effect of nicotine in your brain. This medicine is available by prescription. Ask your caregiver for advice about which medicines to use and how to use them based on your health history. Your caregiver will tell you what side effects to look out for if you choose to be on a medicine or therapy. Carefully read the information on the package. Do not use any other product containing nicotine while using a nicotine replacement product.  RELAPSE OR DIFFICULT SITUATIONS Most relapses occur within the first 3 months after quitting. Do not be discouraged if you start  smoking again. Remember, most people try several times before finally quitting. You may have symptoms of withdrawal because your body is used to nicotine. You may crave cigarettes, be irritable, feel very hungry, cough often, get headaches, or have difficulty concentrating. The withdrawal symptoms are only temporary. They are strongest when you first quit, but they will go away within 10 14 days. To reduce the chances of relapse, try to:  Avoid drinking alcohol. Drinking lowers your chances of successfully quitting.  Reduce the amount of caffeine you consume. Once you quit smoking, the amount of caffeine in your body increases and can give you symptoms, such as a rapid heartbeat, sweating, and anxiety.  Avoid smokers because they can make you want to smoke.  Do not let weight gain distract you. Many smokers will gain weight when they quit, usually less than 10 pounds.  Eat a healthy diet and stay active. You can always lose the weight gained after you quit.  Find ways to improve your mood other than smoking. FOR MORE INFORMATION  www.smokefree.gov  Document Released: 07/22/2001 Document Revised: 01/27/2012 Document Reviewed: 11/06/2011 [facility] Patient Information 2014 [place], [place].

## 2013-06-10 NOTE — Progress Notes (Signed)
Pt is here to establish care. Pt reports having asthma and allergies. Pt is requesting to get back on her medications.

## 2013-06-13 ENCOUNTER — Other Ambulatory Visit: Payer: Self-pay | Admitting: Internal Medicine

## 2013-06-13 DIAGNOSIS — R5383 Other fatigue: Secondary | ICD-10-CM

## 2013-06-13 DIAGNOSIS — T7840XA Allergy, unspecified, initial encounter: Secondary | ICD-10-CM

## 2013-06-13 DIAGNOSIS — J441 Chronic obstructive pulmonary disease with (acute) exacerbation: Secondary | ICD-10-CM

## 2013-06-13 DIAGNOSIS — J45901 Unspecified asthma with (acute) exacerbation: Secondary | ICD-10-CM

## 2013-06-13 MED ORDER — ALBUTEROL SULFATE HFA 108 (90 BASE) MCG/ACT IN AERS: 2.0000 | INHALATION_SPRAY | Freq: Four times a day (QID) | RESPIRATORY_TRACT | Status: DC | PRN

## 2013-06-13 MED ORDER — FLUTICASONE-SALMETEROL 250-50 MCG/DOSE IN AEPB: 1.0000 | INHALATION_SPRAY | Freq: Two times a day (BID) | RESPIRATORY_TRACT | Status: DC

## 2013-07-29 ENCOUNTER — Other Ambulatory Visit: Payer: Self-pay | Admitting: Emergency Medicine

## 2013-07-29 DIAGNOSIS — R5383 Other fatigue: Secondary | ICD-10-CM

## 2013-07-29 MED ORDER — NAPROXEN 500 MG PO TABS: 500.0000 mg | ORAL_TABLET | Freq: Two times a day (BID) | ORAL | Status: DC

## 2013-08-17 ENCOUNTER — Other Ambulatory Visit: Payer: Self-pay | Admitting: Internal Medicine

## 2013-08-22 ENCOUNTER — Telehealth: Payer: Self-pay | Admitting: Internal Medicine

## 2013-08-22 NOTE — Telephone Encounter (Signed)
Pt called in to get a refill on medication naproxen (NAPROSYN) 500 MG tablet;

## 2013-08-23 ENCOUNTER — Other Ambulatory Visit: Payer: Self-pay

## 2013-08-23 DIAGNOSIS — R5383 Other fatigue: Secondary | ICD-10-CM

## 2013-08-23 MED ORDER — NAPROXEN 500 MG PO TABS: 500.0000 mg | ORAL_TABLET | Freq: Two times a day (BID) | ORAL | Status: DC

## 2013-08-24 NOTE — Telephone Encounter (Signed)
Medication reordered on 08/23/13

## 2013-09-09 ENCOUNTER — Ambulatory Visit: Payer: Self-pay

## 2013-10-01 ENCOUNTER — Emergency Department (INDEPENDENT_AMBULATORY_CARE_PROVIDER_SITE_OTHER)
Admission: EM | Admit: 2013-10-01 | Discharge: 2013-10-01 | Disposition: A | Discharge status: Home / Self Care | Payer: No Typology Code available for payment source | Source: Home / Self Care | Attending: Emergency Medicine | Admitting: Emergency Medicine

## 2013-10-01 ENCOUNTER — Encounter (HOSPITAL_COMMUNITY): Payer: Self-pay | Admitting: Emergency Medicine

## 2013-10-01 DIAGNOSIS — A088 Other specified intestinal infections: Secondary | ICD-10-CM

## 2013-10-01 DIAGNOSIS — A084 Viral intestinal infection, unspecified: Secondary | ICD-10-CM

## 2013-10-01 HISTORY — DX: Unspecified osteoarthritis, unspecified site: M19.90

## 2013-10-01 MED ORDER — ONDANSETRON 4 MG PO TBDP
8.0000 mg | ORAL_TABLET | Freq: Once | ORAL | Status: AC
  Administered 2013-10-01: 8 mg via ORAL

## 2013-10-01 MED ORDER — KETOROLAC TROMETHAMINE 60 MG/2ML IM SOLN
INTRAMUSCULAR | Status: AC
  Filled 2013-10-01: qty 2

## 2013-10-01 MED ORDER — ONDANSETRON 8 MG PO TBDP: 8.0000 mg | ORAL_TABLET | Freq: Three times a day (TID) | ORAL | Status: DC | PRN

## 2013-10-01 MED ORDER — ONDANSETRON 4 MG PO TBDP
ORAL_TABLET | ORAL | Status: AC
  Filled 2013-10-01: qty 2

## 2013-10-01 MED ORDER — KETOROLAC TROMETHAMINE 60 MG/2ML IM SOLN
60.0000 mg | Freq: Once | INTRAMUSCULAR | Status: AC
  Administered 2013-10-01: 60 mg via INTRAMUSCULAR

## 2013-10-01 NOTE — Discharge Instructions (Signed)
Diet for Diarrhea, Adult Frequent, runny stools (diarrhea) may be caused or worsened by food or drink. Diarrhea may be relieved by changing your diet. Since diarrhea can last up to 7 days, it is easy for you to lose too much fluid from the body and become dehydrated. Fluids that are lost need to be replaced. Along with a modified diet, make sure you drink enough fluids to keep your urine clear or pale yellow. DIET INSTRUCTIONS  Ensure adequate fluid intake (hydration): have 1 cup (8 oz) of fluid for each diarrhea episode. Avoid fluids that contain simple sugars or sports drinks, fruit juices, whole milk products, and sodas. Your urine should be clear or pale yellow if you are drinking enough fluids. Hydrate with an oral rehydration solution that you can purchase at pharmacies, retail stores, and online. You can prepare an oral rehydration solution at home by mixing the following ingredients together:    tsp table salt.   tsp baking soda.   tsp salt substitute containing potassium chloride.  1  tablespoons sugar.  1 L (34 oz) of water.  Certain foods and beverages may increase the speed at which food moves through the gastrointestinal (GI) tract. These foods and beverages should be avoided and include:  Caffeinated and alcoholic beverages.  High-fiber foods, such as raw fruits and vegetables, nuts, seeds, and whole grain breads and cereals.  Foods and beverages sweetened with sugar alcohols, such as xylitol, sorbitol, and mannitol.  Some foods may be well tolerated and may help thicken stool including:  Starchy foods, such as rice, toast, pasta, low-sugar cereal, oatmeal, grits, baked potatoes, crackers, and bagels.   Bananas.   Applesauce.  Add probiotic-rich foods to help increase healthy bacteria in the GI tract, such as yogurt and fermented milk products. RECOMMENDED FOODS AND BEVERAGES Starches Choose foods with less than 2 g of fiber per serving.  Recommended:  White,  [place], and pita breads, plain rolls, buns, bagels. Plain muffins, matzo. Soda, saltine, or graham crackers. Pretzels, melba toast, zwieback. Cooked cereals made with water: cornmeal, farina, cream cereals. Dry cereals: refined corn, wheat, rice. Potatoes prepared any way without skins, refined macaroni, spaghetti, noodles, refined rice.  Avoid:  Bread, rolls, or crackers made with whole wheat, multi-grains, rye, bran seeds, nuts, or coconut. Corn tortillas or taco shells. Cereals containing whole grains, multi-grains, bran, coconut, nuts, raisins. Cooked or dry oatmeal. Coarse wheat cereals, granola. Cereals advertised as "high-fiber." Potato skins. Whole grain pasta, wild or brown rice. Popcorn. Sweet potatoes, yams. Sweet rolls, doughnuts, waffles, pancakes, sweet breads. Vegetables  Recommended: Strained tomato and vegetable juices. Most well-cooked and canned vegetables without seeds. Fresh: Tender lettuce, cucumber without the skin, cabbage, spinach, bean sprouts.  Avoid: Fresh, cooked, or canned: Artichokes, baked beans, beet greens, broccoli, Brussels sprouts, corn, kale, legumes, peas, sweet potatoes. Cooked: Green or red cabbage, spinach. Avoid large servings of any vegetables because vegetables shrink when cooked, and they contain more fiber per serving than fresh vegetables. Fruit  Recommended: Cooked or canned: Apricots, applesauce, cantaloupe, cherries, fruit cocktail, grapefruit, grapes, kiwi, mandarin oranges, peaches, pears, plums, watermelon. Fresh: Apples without skin, ripe banana, grapes, cantaloupe, cherries, grapefruit, peaches, oranges, plums. Keep servings limited to  cup or 1 piece.  Avoid: Fresh: Apples with skin, apricots, mangoes, pears, raspberries, strawberries. Prune juice, stewed or dried prunes. Dried fruits, raisins, dates. Large servings of all fresh fruits. Protein  Recommended: Ground or well-cooked tender beef, ham, veal, lamb, pork, or poultry. Eggs. Fish,  oysters, shrimp,  lobster, other seafoods. Liver, organ meats.  Avoid: Tough, fibrous meats with gristle. Peanut butter, smooth or chunky. Cheese, nuts, seeds, legumes, dried peas, beans, lentils. Dairy  Recommended: Yogurt, lactose-free milk, kefir, drinkable yogurt, buttermilk, soy milk, or plain hard cheese.  Avoid: Milk, chocolate milk, beverages made with milk, such as milkshakes. Soups  Recommended: Bouillon, broth, or soups made from allowed foods. Any strained soup.  Avoid: Soups made from vegetables that are not allowed, cream or milk-based soups. Desserts and Sweets  Recommended: Sugar-free gelatin, sugar-free frozen ice pops made without sugar alcohol.  Avoid: Plain cakes and cookies, pie made with fruit, pudding, custard, cream pie. Gelatin, fruit, ice, sherbet, frozen ice pops. Ice cream, ice milk without nuts. Plain hard candy, honey, jelly, molasses, syrup, sugar, chocolate syrup, gumdrops, marshmallows. Fats and Oils  Recommended: Limit fats to less than 8 tsp per day.  Avoid: Seeds, nuts, olives, avocados. Margarine, butter, cream, mayonnaise, salad oils, plain salad dressings. Plain gravy, crisp bacon without rind. Beverages  Recommended: Water, decaffeinated teas, oral rehydration solutions, sugar-free beverages not sweetened with sugar alcohols.  Avoid: Fruit juices, caffeinated beverages (coffee, tea, soda), alcohol, sports drinks, or lemon-lime soda. Condiments  Recommended: Ketchup, mustard, horseradish, vinegar, cocoa powder. Spices in moderation: allspice, basil, bay leaves, celery powder or leaves, cinnamon, cumin powder, curry powder, ginger, mace, marjoram, onion or garlic powder, oregano, paprika, parsley flakes, ground pepper, rosemary, sage, savory, tarragon, thyme, turmeric.  Avoid: Coconut, honey. Document Released: 10/18/2003 Document Revised: 04/21/2012 Document Reviewed: 12/12/2011 [facility] Patient Information 2014 [place].  Viral  Gastroenteritis Viral gastroenteritis is also known as stomach flu. This condition affects the stomach and intestinal tract. It can cause sudden diarrhea and vomiting. The illness typically lasts 3 to 8 days. Most people develop an immune response that eventually gets rid of the virus. While this natural response develops, the virus can make you quite ill. CAUSES  Many different viruses can cause gastroenteritis, such as rotavirus or noroviruses. You can catch one of these viruses by consuming contaminated food or water. You may also catch a virus by sharing utensils or other personal items with an infected person or by touching a contaminated surface. SYMPTOMS  The most common symptoms are diarrhea and vomiting. These problems can cause a severe loss of body fluids (dehydration) and a body salt (electrolyte) imbalance. Other symptoms may include:  Fever.  Headache.  Fatigue.  Abdominal pain. DIAGNOSIS  Your caregiver can usually diagnose viral gastroenteritis based on your symptoms and a physical exam. A stool sample may also be taken to test for the presence of viruses or other infections. TREATMENT  This illness typically goes away on its own. Treatments are aimed at rehydration. The most serious cases of viral gastroenteritis involve vomiting so severely that you are not able to keep fluids down. In these cases, fluids must be given through an intravenous line (IV). HOME CARE INSTRUCTIONS   Drink enough fluids to keep your urine clear or pale yellow. Drink small amounts of fluids frequently and increase the amounts as tolerated.  Ask your caregiver for specific rehydration instructions.  Avoid:  Foods high in sugar.  Alcohol.  Carbonated drinks.  Tobacco.  Juice.  Caffeine drinks.  Extremely hot or cold fluids.  Fatty, greasy foods.  Too much intake of anything at one time.  Dairy products until 24 to 48 hours after diarrhea stops.  You may consume probiotics.  Probiotics are active cultures of beneficial bacteria. They may lessen the amount and number  of diarrheal stools in adults. Probiotics can be found in yogurt with active cultures and in supplements.  Wash your hands well to avoid spreading the virus.  Only take over-the-counter or prescription medicines for pain, discomfort, or fever as directed by your caregiver. Do not give aspirin to children. Antidiarrheal medicines are not recommended.  Ask your caregiver if you should continue to take your regular prescribed and over-the-counter medicines.  Keep all follow-up appointments as directed by your caregiver. SEEK IMMEDIATE MEDICAL CARE IF:   You are unable to keep fluids down.  You do not urinate at least once every 6 to 8 hours.  You develop shortness of breath.  You notice blood in your stool or vomit. This may look like coffee grounds.  You have abdominal pain that increases or is concentrated in one small area (localized).  You have persistent vomiting or diarrhea.  You have a fever.  The patient is a child younger than 3 months, and he or she has a fever.  The patient is a child older than 3 months, and he or she has a fever and persistent symptoms.  The patient is a child older than 3 months, and he or she has a fever and symptoms suddenly get worse.  The patient is a baby, and he or she has no tears when crying. MAKE SURE YOU:   Understand these instructions.  Will watch your condition.  Will get help right away if you are not doing well or get worse. Document Released: 07/28/2005 Document Revised: 10/20/2011 Document Reviewed: 05/14/2011 [facility] Patient Information 2014 [place].

## 2013-10-01 NOTE — ED Notes (Signed)
Reports waking up this morning with n/v/d.  Has had many episodes diarrhea, but is starting to slow down with 2 doses Imodium.  Has been able to keep down some small amounts of ginger ale.  C/O intermittent generalized abd cramping.  Denies fevers.  C/O body aches.

## 2013-10-01 NOTE — ED Provider Notes (Signed)
CSN: [id number]     Arrival date & time 10/01/13  1131 History   First MD Initiated Contact with Patient 10/01/13 1231     Chief Complaint  Patient presents with  . Diarrhea  . Emesis     (Consider location/radiation/quality/duration/timing/severity/associated sxs/prior Treatment) HPI Comments: 56f presents c/o NVD since this morning.  She was fine when she went to sleep last night and woke up with NVD.  She has body aches as well.  She says that gastroenteritis has been going around her work.  She is having some mild abdominal pain, she feels this is from the vomiting. She did not have any abdominal pain before she started vomiting. There is no blood in her diarrhea or vomitus. She has taken Imodium which is helping the diarrhea slightly. No dizziness  Patient is a 53 y.o. female presenting with diarrhea and vomiting.  Diarrhea Associated symptoms: myalgias and vomiting   Associated symptoms: no abdominal pain, no arthralgias, no chills and no fever   Emesis Associated symptoms: diarrhea and myalgias   Associated symptoms: no abdominal pain, no arthralgias and no chills     Past Medical History  Diagnosis Date  . Asthma   . Depression   . Arthritis    Past Surgical History  Procedure Laterality Date  . Myomectomy     Family History  Problem Relation Age of Onset  . Lupus Sister   . Heart disease Sister    History  Substance Use Topics  . Smoking status: Current Every Day Smoker -- 0.50 packs/day for 20 years    Types: Cigarettes  . Smokeless tobacco: Never Used  . Alcohol Use: Yes     Comment: occasionally   OB History   Grav Para Term Preterm Abortions TAB SAB Ect Mult Living   2 1 1  1 1    1      Review of Systems  Constitutional: Negative for fever and chills.  Eyes: Negative for visual disturbance.  Respiratory: Negative for cough and shortness of breath.   Cardiovascular: Negative for chest pain, palpitations and leg swelling.  Gastrointestinal: Positive  for nausea, vomiting and diarrhea. Negative for abdominal pain and blood in stool.  Endocrine: Negative for polydipsia and polyuria.  Genitourinary: Negative for dysuria, urgency and frequency.  Musculoskeletal: Positive for myalgias. Negative for arthralgias.  Skin: Negative for rash.  Neurological: Negative for dizziness, weakness and light-headedness.      Allergies  Review of patient's allergies indicates no known allergies.  Home Medications   Current Outpatient Rx  Name  Route  Sig  Dispense  Refill  . albuterol (PROVENTIL HFA;VENTOLIN HFA) 108 (90 BASE) MCG/ACT inhaler   Inhalation   Inhale 2 puffs into the lungs every 6 (six) hours as needed for wheezing.   1 Inhaler   12   . Fluticasone-Salmeterol (ADVAIR DISKUS) 250-50 MCG/DOSE AEPB   Inhalation   Inhale 1 puff into the lungs 2 (two) times daily.   1 each   12   . loratadine (CLARITIN) 10 MG tablet   Oral   Take 1 tablet (10 mg total) by mouth daily.   90 tablet   3   . naproxen (NAPROSYN) 500 MG tablet   Oral   Take 1 tablet (500 mg total) by mouth 2 (two) times daily with a meal.   60 tablet   0   . furosemide (LASIX) 20 MG tablet   Oral   Take 1 tablet (20 mg total) by mouth daily.  30 tablet   3   . ondansetron (ZOFRAN-ODT) 8 MG disintegrating tablet   Oral   Take 1 tablet (8 mg total) by mouth every 8 (eight) hours as needed for nausea or vomiting.   12 tablet   0    BP 119/78  Pulse 78  Temp(Src) 98.5 F (36.9 C) (Oral)  Resp 18  SpO2 95% Physical Exam  Nursing note and vitals reviewed. Constitutional: She is oriented to person, place, and time. Vital signs are normal. She appears well-developed and well-nourished. No distress.  HENT:  Head: Normocephalic and atraumatic.  Cardiovascular: Normal rate, regular rhythm and normal heart sounds.  Exam reveals no gallop and no friction rub.   No murmur heard. Pulmonary/Chest: Effort normal and breath sounds normal. No respiratory distress.  She has no wheezes. She has no rales.  Abdominal: Soft. There is generalized tenderness (mild). There is no rigidity, no rebound, no guarding, no CVA tenderness, no tenderness at McBurney's point and negative Murphy's sign.  Neurological: She is alert and oriented to person, place, and time. She has normal strength. Coordination normal.  Skin: Skin is warm and dry. No rash noted. She is not diaphoretic.  Psychiatric: She has a normal mood and affect. Judgment normal.    ED Course  Procedures (including critical care time) Labs Review Labs Reviewed - No data to display Imaging Review No results found.    MDM   Final diagnoses:  Viral gastroenteritis   Acute onset of NVD with body aches, consistent with viral gastroenteritis. Abdomen exam is benign, no suspicion for acute abdominal process. We'll treat symptomatically with Zofran and Imodium she has at home. We'll give Toradol here to help with the body aches, she is advised to use ibuprofen or Tylenol at home with the body aches once the nausea has been controlled with the Zofran. Discussed BRAT diet. Push by mouth fluids. Followup when necessary if not improving   Meds ordered this encounter  Medications  . ondansetron (ZOFRAN-ODT) disintegrating tablet 8 mg    Sig:   . ketorolac (TORADOL) injection 60 mg    Sig:   . ondansetron (ZOFRAN-ODT) 8 MG disintegrating tablet    Sig: Take 1 tablet (8 mg total) by mouth every 8 (eight) hours as needed for nausea or vomiting.    Dispense:  12 tablet    Refill:  0    Order Specific Question:  Supervising Provider    Answer:  Jake Michaelis, DAVID C [[id number]]       Liam Graham, PA-C 10/01/13 1330

## 2013-10-02 NOTE — ED Provider Notes (Signed)
Medical screening examination/treatment/procedure(s) were performed by non-physician practitioner and as supervising physician I was immediately available for consultation/collaboration.  Philipp Deputy, M.D.  Harden Mo, MD 10/02/13 [phone]

## 2013-10-25 ENCOUNTER — Encounter: Payer: Self-pay | Admitting: Internal Medicine

## 2013-10-25 ENCOUNTER — Ambulatory Visit: Payer: No Typology Code available for payment source | Attending: Internal Medicine | Admitting: Internal Medicine

## 2013-10-25 VITALS — BP 119/81 | HR 68 | Temp 99.0°F | Resp 16 | Ht 64.0 in | Wt 215.0 lb

## 2013-10-25 DIAGNOSIS — J309 Allergic rhinitis, unspecified: Secondary | ICD-10-CM | POA: Insufficient documentation

## 2013-10-25 DIAGNOSIS — J45909 Unspecified asthma, uncomplicated: Secondary | ICD-10-CM | POA: Insufficient documentation

## 2013-10-25 DIAGNOSIS — J45901 Unspecified asthma with (acute) exacerbation: Secondary | ICD-10-CM

## 2013-10-25 DIAGNOSIS — J441 Chronic obstructive pulmonary disease with (acute) exacerbation: Secondary | ICD-10-CM

## 2013-10-25 DIAGNOSIS — Z79899 Other long term (current) drug therapy: Secondary | ICD-10-CM | POA: Insufficient documentation

## 2013-10-25 DIAGNOSIS — R5381 Other malaise: Secondary | ICD-10-CM

## 2013-10-25 DIAGNOSIS — F329 Major depressive disorder, single episode, unspecified: Secondary | ICD-10-CM | POA: Insufficient documentation

## 2013-10-25 DIAGNOSIS — F3289 Other specified depressive episodes: Secondary | ICD-10-CM | POA: Insufficient documentation

## 2013-10-25 DIAGNOSIS — F17200 Nicotine dependence, unspecified, uncomplicated: Secondary | ICD-10-CM | POA: Insufficient documentation

## 2013-10-25 DIAGNOSIS — R5383 Other fatigue: Secondary | ICD-10-CM

## 2013-10-25 MED ORDER — LORATADINE 10 MG PO TABS: 10.0000 mg | ORAL_TABLET | Freq: Every day | ORAL | Status: DC

## 2013-10-25 MED ORDER — FUROSEMIDE 20 MG PO TABS: 20.0000 mg | ORAL_TABLET | Freq: Every day | ORAL | Status: DC

## 2013-10-25 MED ORDER — FLUTICASONE-SALMETEROL 250-50 MCG/DOSE IN AEPB: 1.0000 | INHALATION_SPRAY | Freq: Two times a day (BID) | RESPIRATORY_TRACT | Status: DC

## 2013-10-25 MED ORDER — ALBUTEROL SULFATE HFA 108 (90 BASE) MCG/ACT IN AERS: 2.0000 | INHALATION_SPRAY | Freq: Four times a day (QID) | RESPIRATORY_TRACT | Status: DC | PRN

## 2013-10-25 NOTE — Progress Notes (Signed)
Patient ID: Jacqueline Good, female   DOB: 1961/06/06, 53 y.o.   MRN: [id number]   CC:  HPI: 53 year old female here for an allergy shot. She is disappointed that she's not receiving the shot today and she will be referred to an allergist. Her main symptoms are runny nose, watery eyes, she continues to smoke. No shortness of breath or chest pain  No Known Allergies Past Medical History  Diagnosis Date  . Asthma   . Depression   . Arthritis    Current Outpatient Prescriptions on File Prior to Visit  Medication Sig Dispense Refill  . naproxen (NAPROSYN) 500 MG tablet Take 1 tablet (500 mg total) by mouth 2 (two) times daily with a meal.  60 tablet  0  . ondansetron (ZOFRAN-ODT) 8 MG disintegrating tablet Take 1 tablet (8 mg total) by mouth every 8 (eight) hours as needed for nausea or vomiting.  12 tablet  0   No current facility-administered medications on file prior to visit.   Family History  Problem Relation Age of Onset  . Lupus Sister   . Heart disease Sister    History   Social History  . Marital Status: Single    Spouse Name: N/A    Number of Children: N/A  . Years of Education: N/A   Occupational History  . Not on file.   Social History Main Topics  . Smoking status: Current Every Day Smoker -- 0.50 packs/day for 20 years    Types: Cigarettes  . Smokeless tobacco: Never Used  . Alcohol Use: Yes     Comment: occasionally  . Drug Use: No  . Sexual Activity: Not on file   Other Topics Concern  . Not on file   Social History Narrative  . No narrative on file    Review of Systems  Constitutional: Negative for fever, chills, diaphoresis, activity change, appetite change and fatigue.  HENT: Negative for ear pain, nosebleeds, congestion, facial swelling, rhinorrhea, neck pain, neck stiffness and ear discharge.   Eyes: Negative for pain, discharge, redness, itching and visual disturbance.  Respiratory: Negative for cough, choking, chest tightness, shortness of  breath, wheezing and stridor.   Cardiovascular: Negative for chest pain, palpitations and leg swelling.  Gastrointestinal: Negative for abdominal distention.  Genitourinary: Negative for dysuria, urgency, frequency, hematuria, flank pain, decreased urine volume, difficulty urinating and dyspareunia.  Musculoskeletal: Negative for back pain, joint swelling, arthralgias and gait problem.  Neurological: Negative for dizziness, tremors, seizures, syncope, facial asymmetry, speech difficulty, weakness, light-headedness, numbness and headaches.  Hematological: Negative for adenopathy. Does not bruise/bleed easily.  Psychiatric/Behavioral: Negative for hallucinations, behavioral problems, confusion, dysphoric mood, decreased concentration and agitation.    Objective:   Filed Vitals:   10/25/13 1724  BP: 119/81  Pulse: 68  Temp: 99 F (37.2 C)  Resp: 16    Physical Exam  Constitutional: Appears well-developed and well-nourished. No distress.  HENT: Normocephalic. External right and left ear normal. Oropharynx is clear and moist.  Eyes: Conjunctivae and EOM are normal. PERRLA, no scleral icterus.  Neck: Normal ROM. Neck supple. No JVD. No tracheal deviation. No thyromegaly.  CVS: RRR, S1/S2 +, no murmurs, no gallops, no carotid bruit.  Pulmonary: Effort and breath sounds normal, no stridor, rhonchi, wheezes, rales.  Abdominal: Soft. BS +,  no distension, tenderness, rebound or guarding.  Musculoskeletal: Normal range of motion. No edema and no tenderness.  Lymphadenopathy: No lymphadenopathy noted, cervical, inguinal. Neuro: Alert. Normal reflexes, muscle tone coordination. No cranial nerve deficit.  Skin: Skin is warm and dry. No rash noted. Not diaphoretic. No erythema. No pallor.  Psychiatric: Normal mood and affect. Behavior, judgment, thought content normal.   Lab Results  Component Value Date   WBC 8.5 06/10/2013   HGB 13.9 06/10/2013   HCT 41.4 06/10/2013   MCV 84.0 06/10/2013    PLT 308 06/10/2013   Lab Results  Component Value Date   CREATININE 0.66 06/10/2013   BUN 13 06/10/2013   NA 139 06/10/2013   K 4.7 06/10/2013   CL 106 06/10/2013   CO2 25 06/10/2013    No results found for this basename: HGBA1C   Lipid Panel  No results found for this basename: chol, trig, hdl, cholhdl, vldl, ldlcalc       Assessment and plan:   Patient Active Problem List   Diagnosis Date Noted  . Fatigue 06/10/2013  . Morbid obesity 06/10/2013  . Asthma, chronic obstructive, with acute exacerbation 06/10/2013  . Allergy 06/10/2013   Allergic rhinitis Refills Claritin Continue Advair Referral to allergist provided  Follow up in 3 months       The patient was given clear instructions to go to ER or return to medical center if symptoms don't improve, worsen or new problems develop. The patient verbalized understanding. The patient was told to call to get any lab results if not heard anything in the next week.

## 2013-10-25 NOTE — Progress Notes (Signed)
Pt is here following up on her asthma. Pt is here requesting an allergy shot.

## 2013-10-28 ENCOUNTER — Other Ambulatory Visit: Payer: Self-pay | Admitting: *Deleted

## 2013-10-28 DIAGNOSIS — R5383 Other fatigue: Secondary | ICD-10-CM

## 2013-10-28 DIAGNOSIS — J441 Chronic obstructive pulmonary disease with (acute) exacerbation: Secondary | ICD-10-CM

## 2013-10-28 DIAGNOSIS — J45901 Unspecified asthma with (acute) exacerbation: Secondary | ICD-10-CM

## 2013-10-28 MED ORDER — NAPROXEN 500 MG PO TABS: 500.0000 mg | ORAL_TABLET | Freq: Two times a day (BID) | ORAL | Status: DC

## 2013-10-28 MED ORDER — FLUTICASONE PROPIONATE 50 MCG/ACT NA SUSP: 2.0000 | Freq: Every day | NASAL | Status: DC

## 2013-10-28 MED ORDER — LORATADINE 10 MG PO TABS: 10.0000 mg | ORAL_TABLET | Freq: Every day | ORAL | Status: DC

## 2013-10-28 MED ORDER — ALBUTEROL SULFATE HFA 108 (90 BASE) MCG/ACT IN AERS: 2.0000 | INHALATION_SPRAY | Freq: Four times a day (QID) | RESPIRATORY_TRACT | Status: DC | PRN

## 2013-10-28 MED ORDER — FUROSEMIDE 20 MG PO TABS: 20.0000 mg | ORAL_TABLET | Freq: Every day | ORAL | Status: DC

## 2013-10-28 MED ORDER — FLUTICASONE-SALMETEROL 250-50 MCG/DOSE IN AEPB: 1.0000 | INHALATION_SPRAY | Freq: Two times a day (BID) | RESPIRATORY_TRACT | Status: DC

## 2013-12-23 ENCOUNTER — Other Ambulatory Visit: Payer: Self-pay | Admitting: Internal Medicine

## 2013-12-23 DIAGNOSIS — Z1231 Encounter for screening mammogram for malignant neoplasm of breast: Secondary | ICD-10-CM

## 2014-01-09 ENCOUNTER — Ambulatory Visit (HOSPITAL_COMMUNITY)
Admission: RE | Admit: 2014-01-09 | Discharge: 2014-01-09 | Disposition: A | Discharge status: Home / Self Care | Payer: No Typology Code available for payment source | Source: Ambulatory Visit | Attending: Internal Medicine | Admitting: Internal Medicine

## 2014-01-09 DIAGNOSIS — Z1231 Encounter for screening mammogram for malignant neoplasm of breast: Secondary | ICD-10-CM | POA: Insufficient documentation

## 2014-01-18 ENCOUNTER — Telehealth: Payer: Self-pay | Admitting: *Deleted

## 2014-01-18 NOTE — Telephone Encounter (Signed)
Message copied by Joan Mayans on Wed Jan 18, 2014  4:45 PM ------      Message from: Angelica Chessman E      Created: Wed Jan 18, 2014 12:01 PM       Please inform patient that her screening mammogram is negative for malignancy ------

## 2014-01-18 NOTE — Telephone Encounter (Signed)
Pt is aware of her MM results. 

## 2014-02-06 ENCOUNTER — Other Ambulatory Visit: Payer: Self-pay | Admitting: Internal Medicine

## 2014-02-06 DIAGNOSIS — M1990 Unspecified osteoarthritis, unspecified site: Secondary | ICD-10-CM

## 2014-02-09 ENCOUNTER — Telehealth: Payer: Self-pay | Admitting: *Deleted

## 2014-02-09 DIAGNOSIS — M1990 Unspecified osteoarthritis, unspecified site: Secondary | ICD-10-CM

## 2014-02-09 MED ORDER — NAPROXEN 500 MG PO TABS: 500.0000 mg | ORAL_TABLET | Freq: Two times a day (BID) | ORAL | Status: DC

## 2014-02-09 NOTE — Telephone Encounter (Signed)
Patient calling for refill -

## 2014-04-05 ENCOUNTER — Other Ambulatory Visit: Payer: Self-pay | Admitting: Internal Medicine

## 2014-04-07 ENCOUNTER — Other Ambulatory Visit: Payer: Self-pay | Admitting: Internal Medicine

## 2014-04-11 ENCOUNTER — Telehealth: Payer: Self-pay | Admitting: Internal Medicine

## 2014-04-11 NOTE — Telephone Encounter (Signed)
Pt has not been seen since march per policy pt needs another appointment to see the doctor before she will get any scripts

## 2014-04-11 NOTE — Telephone Encounter (Signed)
Pt. Calling to ask why the refill for naproxen (NAPROSYN) 500 MG tablet was denied. Please f/u with pt.

## 2014-04-27 ENCOUNTER — Encounter: Payer: Self-pay | Admitting: Internal Medicine

## 2014-04-27 ENCOUNTER — Ambulatory Visit: Payer: No Typology Code available for payment source | Attending: Internal Medicine | Admitting: Internal Medicine

## 2014-04-27 VITALS — BP 109/75 | HR 70 | Temp 98.0°F | Resp 16 | Ht 66.0 in | Wt 223.0 lb

## 2014-04-27 DIAGNOSIS — M1990 Unspecified osteoarthritis, unspecified site: Secondary | ICD-10-CM | POA: Insufficient documentation

## 2014-04-27 DIAGNOSIS — J45901 Unspecified asthma with (acute) exacerbation: Secondary | ICD-10-CM | POA: Insufficient documentation

## 2014-04-27 DIAGNOSIS — Z6836 Body mass index (BMI) 36.0-36.9, adult: Secondary | ICD-10-CM | POA: Diagnosis not present

## 2014-04-27 DIAGNOSIS — M129 Arthropathy, unspecified: Secondary | ICD-10-CM | POA: Insufficient documentation

## 2014-04-27 DIAGNOSIS — J441 Chronic obstructive pulmonary disease with (acute) exacerbation: Secondary | ICD-10-CM

## 2014-04-27 LAB — CBC WITH DIFFERENTIAL/PLATELET
Basophils Absolute: 0 10*3/uL (ref 0.0–0.1)
Basophils Relative: 0 % (ref 0–1)
Eosinophils Absolute: 0.2 10*3/uL (ref 0.0–0.7)
Eosinophils Relative: 2 % (ref 0–5)
HCT: 39.7 % (ref 36.0–46.0)
Hemoglobin: 13.4 g/dL (ref 12.0–15.0)
Lymphocytes Relative: 24 % (ref 12–46)
Lymphs Abs: 2.2 10*3/uL (ref 0.7–4.0)
MCH: 29.1 pg (ref 26.0–34.0)
MCHC: 33.8 g/dL (ref 30.0–36.0)
MCV: 86.3 fL (ref 78.0–100.0)
MONOS PCT: 7 % (ref 3–12)
Monocytes Absolute: 0.6 10*3/uL (ref 0.1–1.0)
NEUTROS ABS: 6.2 10*3/uL (ref 1.7–7.7)
NEUTROS PCT: 67 % (ref 43–77)
Platelets: 271 10*3/uL (ref 150–400)
RBC: 4.6 MIL/uL (ref 3.87–5.11)
RDW: 13.7 % (ref 11.5–15.5)
WBC: 9.2 10*3/uL (ref 4.0–10.5)

## 2014-04-27 LAB — POCT GLYCOSYLATED HEMOGLOBIN (HGB A1C): Hemoglobin A1C: 5.4

## 2014-04-27 MED ORDER — FUROSEMIDE 20 MG PO TABS: 20.0000 mg | ORAL_TABLET | Freq: Every day | ORAL | Status: DC

## 2014-04-27 MED ORDER — PREDNISONE 10 MG PO TABS: 10.0000 mg | ORAL_TABLET | Freq: Every day | ORAL | Status: DC

## 2014-04-27 MED ORDER — NAPROXEN 500 MG PO TABS: 500.0000 mg | ORAL_TABLET | Freq: Two times a day (BID) | ORAL | Status: DC

## 2014-04-27 MED ORDER — DOXYCYCLINE HYCLATE 100 MG PO TABS: 100.0000 mg | ORAL_TABLET | Freq: Two times a day (BID) | ORAL | Status: DC

## 2014-04-27 MED ORDER — GUAIFENESIN-DM 100-10 MG/5ML PO SYRP
5.0000 mL | ORAL_SOLUTION | ORAL | Status: DC | PRN
Start: 2014-04-27 — End: 2016-03-27

## 2014-04-27 NOTE — Progress Notes (Signed)
Pt is here today needing her medications refilled. Pt suspect that she may have bronchitis.

## 2014-04-27 NOTE — Patient Instructions (Signed)

## 2014-04-27 NOTE — Progress Notes (Signed)
Patient ID: Jacqueline Good, female   DOB: 16-May-1961, 53 y.o.   MRN: [id number]   Jacqueline Good, is a 53 y.o. female  ZJQ:[id number]  [id number]  DOB - 04-01-1961  Chief Complaint  Patient presents with  . Follow-up        Subjective:   Jacqueline Good is a 53 y.o. female here today for a follow up visit. Patient is here for medication refills. Medical history significant for major depression, anxiety, arthritis and asthma. She has no new complaint today but she thinks she might have bronchitis, she has been coughing more lately productive of whitish/clear sputum. No shortness of breath. Patient has No headache, No chest pain, No abdominal pain - No Nausea, No new weakness tingling or numbness.  Problem  Arthritis    ALLERGIES: No Known Allergies  PAST MEDICAL HISTORY: Past Medical History  Diagnosis Date  . Asthma   . Depression   . Arthritis     MEDICATIONS AT HOME: Prior to Admission medications   Medication Sig Start Date End Date Taking? Authorizing Provider  albuterol (PROVENTIL HFA;VENTOLIN HFA) 108 (90 BASE) MCG/ACT inhaler Inhale 2 puffs into the lungs every 6 (six) hours as needed for wheezing. 10/28/13  Yes Reyne Dumas, MD  fluticasone (FLONASE) 50 MCG/ACT nasal spray Place 2 sprays into both nostrils daily. 10/28/13  Yes Reyne Dumas, MD  Fluticasone-Salmeterol (ADVAIR DISKUS) 250-50 MCG/DOSE AEPB Inhale 1 puff into the lungs 2 (two) times daily. 10/28/13  Yes Reyne Dumas, MD  loratadine (CLARITIN) 10 MG tablet Take 1 tablet (10 mg total) by mouth daily. 10/28/13  Yes Reyne Dumas, MD  naproxen (NAPROSYN) 500 MG tablet Take 1 tablet (500 mg total) by mouth 2 (two) times daily with a meal. 04/27/14  Yes Tresa Garter, MD  doxycycline (VIBRA-TABS) 100 MG tablet Take 1 tablet (100 mg total) by mouth 2 (two) times daily. 04/27/14   Tresa Garter, MD  furosemide (LASIX) 20 MG tablet Take 1 tablet (20 mg total) by mouth daily. 04/27/14   Tresa Garter,  MD  guaiFENesin-dextromethorphan (ROBITUSSIN DM) 100-10 MG/5ML syrup Take 5 mLs by mouth every 4 (four) hours as needed for cough. 04/27/14   Tresa Garter, MD  ondansetron (ZOFRAN-ODT) 8 MG disintegrating tablet Take 1 tablet (8 mg total) by mouth every 8 (eight) hours as needed for nausea or vomiting. 10/01/13   Liam Graham, PA-C  predniSONE (DELTASONE) 10 MG tablet Take 1 tablet (10 mg total) by mouth daily with breakfast. 04/27/14   Tresa Garter, MD     Objective:   Filed Vitals:   04/27/14 1708  BP: 109/75  Pulse: 70  Temp: 98 F (36.7 C)  TempSrc: Oral  Resp: 16  Height: 5\' 6"  (1.676 m)  Weight: 223 lb (101.152 kg)  SpO2: 98%    Exam General appearance : Awake, alert, not in any distress. Speech Clear. Not toxic looking HEENT: Atraumatic and Normocephalic, pupils equally reactive to light and accomodation Neck: supple, no JVD. No cervical lymphadenopathy.  Chest:Good air entry bilaterally, no added sounds  CVS: S1 S2 regular, no murmurs.  Abdomen: Bowel sounds present, Non tender and not distended with no gaurding, rigidity or rebound. Extremities: B/L Lower Ext shows no edema, both legs are warm to touch Neurology: Awake alert, and oriented X 3, CN II-XII intact, Non focal  Data Review No results found for this basename: HGBA1C     Assessment & Plan   1. Arthritis  - naproxen (NAPROSYN) 500  MG tablet; Take 1 tablet (500 mg total) by mouth 2 (two) times daily with a meal.  Dispense: 60 tablet; Refill: 0  2. Morbid obesity  - furosemide (LASIX) 20 MG tablet; Take 1 tablet (20 mg total) by mouth daily.  Dispense: 30 tablet; Refill: 3 - CBC with Differential - POCT glycosylated hemoglobin (Hb A1C) - COMPLETE METABOLIC PANEL WITH GFR - Lipid panel - TSH  3. Asthma, chronic obstructive, with acute exacerbation  Continue albuterol inhaler and Advair Patient has been counseled to avoid triggers of asthma exacerbation  Return in about 6 months  (around 10/26/2014), or if symptoms worsen or fail to improve, for COPD, Annual Physical.  The patient was given clear instructions to go to ER or return to medical center if symptoms don't improve, worsen or new problems develop. The patient verbalized understanding. The patient was told to call to get lab results if they haven't heard anything in the next week.   This note has been created with Surveyor, quantity. Any transcriptional errors are unintentional.    Angelica Chessman, MD, [place], [place], [place] and [place], [place]   04/27/2014, 5:43 PM

## 2014-04-28 LAB — COMPLETE METABOLIC PANEL WITH GFR
ALBUMIN: 4.2 g/dL (ref 3.5–5.2)
ALT: 19 U/L (ref 0–35)
AST: 15 U/L (ref 0–37)
Alkaline Phosphatase: 81 U/L (ref 39–117)
BUN: 16 mg/dL (ref 6–23)
CALCIUM: 9.4 mg/dL (ref 8.4–10.5)
CO2: 24 meq/L (ref 19–32)
Chloride: 108 mEq/L (ref 96–112)
Creat: 0.66 mg/dL (ref 0.50–1.10)
GFR, Est Non African American: >89 mL/min
Glucose, Bld: 101 mg/dL — ABNORMAL HIGH (ref 70–99)
Potassium: 4.5 mEq/L (ref 3.5–5.3)
SODIUM: 145 meq/L (ref 135–145)
TOTAL PROTEIN: 6.7 g/dL (ref 6.0–8.3)
Total Bilirubin: 0.4 mg/dL (ref 0.2–1.2)

## 2014-04-28 LAB — TSH: TSH: 0.696 u[IU]/mL (ref 0.350–4.500)

## 2014-04-28 LAB — LIPID PANEL
Cholesterol: 182 mg/dL (ref 0–200)
HDL: 54 mg/dL (ref >39)
LDL CALC: 86 mg/dL (ref 0–99)
Total CHOL/HDL Ratio: 3.4 Ratio
Triglycerides: 209 mg/dL — ABNORMAL HIGH (ref <150)
VLDL: 42 mg/dL — ABNORMAL HIGH (ref 0–40)

## 2014-05-02 ENCOUNTER — Encounter: Payer: Self-pay | Admitting: *Deleted

## 2014-05-02 NOTE — Progress Notes (Signed)
Pt is aware of her lab results.  

## 2014-05-04 ENCOUNTER — Telehealth: Payer: Self-pay | Admitting: Emergency Medicine

## 2014-05-04 NOTE — Telephone Encounter (Signed)
Pt given lab results with instructions to adhere to low cholesterol diet/exercise

## 2014-05-04 NOTE — Telephone Encounter (Signed)
Message copied by Ricci Barker on Thu May 04, 2014  5:27 PM ------      Message from: Angelica Chessman E      Created: Mon May 01, 2014  5:57 PM       Please inform patient that her laboratory tests results are normal. There is a slightly high triglyceride level, a form of cholesterol. We advised that patient adhere with low cholesterol low fat diet as well as regular physical exercise at least 3 times a week 30 minutes each time. ------

## 2014-06-12 ENCOUNTER — Encounter: Payer: Self-pay | Admitting: Internal Medicine

## 2014-07-05 ENCOUNTER — Other Ambulatory Visit: Payer: Self-pay | Admitting: Internal Medicine

## 2014-07-10 ENCOUNTER — Other Ambulatory Visit: Payer: Self-pay | Admitting: Internal Medicine

## 2014-07-10 DIAGNOSIS — M1990 Unspecified osteoarthritis, unspecified site: Secondary | ICD-10-CM

## 2014-07-10 NOTE — Telephone Encounter (Signed)
Pt is requesting a refill for naproxen (NAPROSYN) 500 MG tablet. Please follow up with pt.

## 2014-07-13 ENCOUNTER — Ambulatory Visit: Payer: No Typology Code available for payment source | Admitting: Internal Medicine

## 2014-07-13 NOTE — Telephone Encounter (Signed)
Pt is requesting a refill for the medication mentioned below.

## 2014-07-24 ENCOUNTER — Ambulatory Visit: Payer: No Typology Code available for payment source | Admitting: Internal Medicine

## 2014-07-28 ENCOUNTER — Other Ambulatory Visit: Payer: Self-pay | Admitting: *Deleted

## 2014-07-28 DIAGNOSIS — M1990 Unspecified osteoarthritis, unspecified site: Secondary | ICD-10-CM

## 2014-07-28 MED ORDER — NAPROXEN 500 MG PO TABS: 500.0000 mg | ORAL_TABLET | Freq: Two times a day (BID) | ORAL | Status: DC

## 2014-07-28 NOTE — Telephone Encounter (Signed)
Returned patient call regarding refill. Patient did not answer. Message left for patient to return call. Medication refill request forwarded to Dr. Doreene Burke. Vivia Birmingham, RN

## 2014-10-09 ENCOUNTER — Telehealth: Payer: Self-pay | Admitting: Internal Medicine

## 2014-10-09 ENCOUNTER — Other Ambulatory Visit: Payer: Self-pay | Admitting: Internal Medicine

## 2014-10-09 NOTE — Telephone Encounter (Signed)
Pt called to schedule appointment with PCP

## 2014-10-09 NOTE — Telephone Encounter (Signed)
Patient called stating that she believes she has vertigo and would like to speak to a nurse. Please f/u with pt.

## 2014-10-19 ENCOUNTER — Ambulatory Visit: Payer: No Typology Code available for payment source | Admitting: Internal Medicine

## 2014-10-31 ENCOUNTER — Ambulatory Visit: Payer: Self-pay | Attending: Internal Medicine | Admitting: Internal Medicine

## 2014-10-31 ENCOUNTER — Encounter: Payer: Self-pay | Admitting: Internal Medicine

## 2014-10-31 ENCOUNTER — Ambulatory Visit: Payer: No Typology Code available for payment source | Attending: Internal Medicine

## 2014-10-31 DIAGNOSIS — H8113 Benign paroxysmal vertigo, bilateral: Secondary | ICD-10-CM

## 2014-10-31 DIAGNOSIS — J45901 Unspecified asthma with (acute) exacerbation: Secondary | ICD-10-CM

## 2014-10-31 DIAGNOSIS — M1990 Unspecified osteoarthritis, unspecified site: Secondary | ICD-10-CM

## 2014-10-31 DIAGNOSIS — H8110 Benign paroxysmal vertigo, unspecified ear: Secondary | ICD-10-CM

## 2014-10-31 DIAGNOSIS — J449 Chronic obstructive pulmonary disease, unspecified: Secondary | ICD-10-CM

## 2014-10-31 DIAGNOSIS — J441 Chronic obstructive pulmonary disease with (acute) exacerbation: Secondary | ICD-10-CM

## 2014-10-31 HISTORY — DX: Benign paroxysmal vertigo, unspecified ear: H81.10

## 2014-10-31 LAB — COMPLETE METABOLIC PANEL WITH GFR
ALT: 32 U/L (ref 0–35)
AST: 20 U/L (ref 0–37)
Albumin: 4.1 g/dL (ref 3.5–5.2)
Alkaline Phosphatase: 86 U/L (ref 39–117)
BILIRUBIN TOTAL: 0.5 mg/dL (ref 0.2–1.2)
BUN: 15 mg/dL (ref 6–23)
CO2: 29 mEq/L (ref 19–32)
CREATININE: 0.59 mg/dL (ref 0.50–1.10)
Calcium: 8.7 mg/dL (ref 8.4–10.5)
Chloride: 107 mEq/L (ref 96–112)
GFR, Est African American: >89 mL/min
GFR, Est Non African American: >89 mL/min
Glucose, Bld: 86 mg/dL (ref 70–99)
Potassium: 4 mEq/L (ref 3.5–5.3)
Sodium: 141 mEq/L (ref 135–145)
TOTAL PROTEIN: 6.3 g/dL (ref 6.0–8.3)

## 2014-10-31 MED ORDER — CYCLOBENZAPRINE HCL 10 MG PO TABS: 10.0000 mg | ORAL_TABLET | Freq: Three times a day (TID) | ORAL | Status: DC | PRN

## 2014-10-31 MED ORDER — FLUTICASONE-SALMETEROL 250-50 MCG/DOSE IN AEPB: 1.0000 | INHALATION_SPRAY | Freq: Two times a day (BID) | RESPIRATORY_TRACT | Status: DC

## 2014-10-31 MED ORDER — ALBUTEROL SULFATE HFA 108 (90 BASE) MCG/ACT IN AERS
2.0000 | INHALATION_SPRAY | Freq: Four times a day (QID) | RESPIRATORY_TRACT | Status: DC | PRN
Start: 2014-10-31 — End: 2014-12-01

## 2014-10-31 MED ORDER — FUROSEMIDE 20 MG PO TABS: 20.0000 mg | ORAL_TABLET | Freq: Every day | ORAL | Status: DC

## 2014-10-31 MED ORDER — LORATADINE 10 MG PO TABS: 10.0000 mg | ORAL_TABLET | Freq: Every day | ORAL | Status: DC

## 2014-10-31 MED ORDER — FLUTICASONE PROPIONATE 50 MCG/ACT NA SUSP: 2.0000 | Freq: Every day | NASAL | Status: DC

## 2014-10-31 MED ORDER — NAPROXEN 500 MG PO TABS: 500.0000 mg | ORAL_TABLET | Freq: Two times a day (BID) | ORAL | Status: DC

## 2014-10-31 MED ORDER — MECLIZINE HCL 25 MG PO TABS: 25.0000 mg | ORAL_TABLET | Freq: Three times a day (TID) | ORAL | Status: DC | PRN

## 2014-10-31 NOTE — Patient Instructions (Signed)

## 2014-10-31 NOTE — Progress Notes (Signed)
Pt is here today c/o vertigo.  Pt states that she has bad muscle spasms in her back. Pt needs her medications refilled. Pt is a current smoker.

## 2014-10-31 NOTE — Progress Notes (Signed)
Patient ID: Jacqueline Good, female   DOB: 1961-03-06, 54 y.o.   MRN: [id number]   Jacqueline Good, is a 54 y.o. female  [id number]  NAT:[id number]  DOB - Mar 06, 1961  Chief Complaint  Patient presents with  . Follow-up        Subjective:   Jacqueline Good is a 54 y.o. female here today for a follow up visit. Patient has medical history significant for asthma, allergy, osteoarthritis and major depression, here today with major complaint of dizziness for about 2 months. Feels the room spinning around her, she feels dizzy when she bends down, and when she lies down. She has no headache. She has no hearing impairment. She has no blurry vision or double vision. She did not report any fall. No fever. She has not tried any medication. She also needs refills on all her medications. Patient has No chest pain, No abdominal pain - No Nausea, no vomiting, No new weakness tingling or numbness, No Cough - SOB.  Problem  Benign Paroxysmal Positional Vertigo    ALLERGIES: No Known Allergies  PAST MEDICAL HISTORY: Past Medical History  Diagnosis Date  . Asthma   . Depression   . Arthritis     MEDICATIONS AT HOME: Prior to Admission medications   Medication Sig Start Date End Date Taking? Authorizing Provider  albuterol (PROVENTIL HFA;VENTOLIN HFA) 108 (90 BASE) MCG/ACT inhaler Inhale 2 puffs into the lungs every 6 (six) hours as needed for wheezing. 10/31/14  Yes Tresa Garter, MD  fluticasone (FLONASE) 50 MCG/ACT nasal spray Place 2 sprays into both nostrils daily. 10/31/14  Yes Tresa Garter, MD  Fluticasone-Salmeterol (ADVAIR DISKUS) 250-50 MCG/DOSE AEPB Inhale 1 puff into the lungs 2 (two) times daily. 10/31/14  Yes Tresa Garter, MD  furosemide (LASIX) 20 MG tablet Take 1 tablet (20 mg total) by mouth daily. 10/31/14  Yes Tresa Garter, MD  loratadine (CLARITIN) 10 MG tablet Take 1 tablet (10 mg total) by mouth daily. 10/31/14  Yes Tresa Garter, MD  naproxen  (NAPROSYN) 500 MG tablet Take 1 tablet (500 mg total) by mouth 2 (two) times daily with a meal. 10/31/14  Yes Tresa Garter, MD  cyclobenzaprine (FLEXERIL) 10 MG tablet Take 1 tablet (10 mg total) by mouth 3 (three) times daily as needed for muscle spasms. 10/31/14   Tresa Garter, MD  doxycycline (VIBRA-TABS) 100 MG tablet Take 1 tablet (100 mg total) by mouth 2 (two) times daily. Patient not taking: Reported on 10/31/2014 04/27/14   Tresa Garter, MD  guaiFENesin-dextromethorphan (ROBITUSSIN DM) 100-10 MG/5ML syrup Take 5 mLs by mouth every 4 (four) hours as needed for cough. Patient not taking: Reported on 10/31/2014 04/27/14   Tresa Garter, MD  meclizine (ANTIVERT) 25 MG tablet Take 1 tablet (25 mg total) by mouth 3 (three) times daily as needed for dizziness. 10/31/14   Tresa Garter, MD  ondansetron (ZOFRAN-ODT) 8 MG disintegrating tablet Take 1 tablet (8 mg total) by mouth every 8 (eight) hours as needed for nausea or vomiting. Patient not taking: Reported on 10/31/2014 10/01/13   Liam Graham, PA-C  predniSONE (DELTASONE) 10 MG tablet Take 1 tablet (10 mg total) by mouth daily with breakfast. Patient not taking: Reported on 10/31/2014 04/27/14   Tresa Garter, MD     Objective:   Filed Vitals:   10/31/14 1124  BP: 115/77  Pulse: 59  Temp: 98.6 F (37 C)  TempSrc: Oral  Resp: 16  Height:  5\' 4"  (1.626 m)  Weight: 223 lb (101.152 kg)  SpO2: 97%    Exam General appearance : Awake, alert, not in any distress. Speech Clear. Not toxic looking HEENT: Atraumatic and Normocephalic, pupils equally reactive to light and accomodation, no otolith Neck: supple, no JVD. No cervical lymphadenopathy.  Chest:Good air entry bilaterally, no added sounds  CVS: S1 S2 regular, no murmurs.  Abdomen: Bowel sounds present, Non tender and not distended with no gaurding, rigidity or rebound. Extremities: B/L Lower Ext shows no edema, both legs are warm to touch Neurology:  Awake alert, and oriented X 3, CN II-XII intact, Non focal, negative Romberg sign, no ataxia Skin:No Rash  Data Review Lab Results  Component Value Date   HGBA1C 5.4 04/27/2014     Assessment & Plan   1. Morbid obesity with occasionally lower limb swelling  - furosemide (LASIX) 20 MG tablet; Take 1 tablet (20 mg total) by mouth daily when necessary.  Dispense: 30 tablet; Refill: 3  2. Asthma, chronic obstructive, with acute exacerbation  - fluticasone (FLONASE) 50 MCG/ACT nasal spray; Place 2 sprays into both nostrils daily.  Dispense: 16 g; Refill: 6 - albuterol (PROVENTIL HFA;VENTOLIN HFA) 108 (90 BASE) MCG/ACT inhaler; Inhale 2 puffs into the lungs every 6 (six) hours as needed for wheezing.  Dispense: 1 Inhaler; Refill: 6 - Fluticasone-Salmeterol (ADVAIR DISKUS) 250-50 MCG/DOSE AEPB; Inhale 1 puff into the lungs 2 (two) times daily.  Dispense: 1 each; Refill: 6 - loratadine (CLARITIN) 10 MG tablet; Take 1 tablet (10 mg total) by mouth daily.  Dispense: 90 tablet; Refill: 3  3. Arthritis  - naproxen (NAPROSYN) 500 MG tablet; Take 1 tablet (500 mg total) by mouth 2 (two) times daily with a meal.  Dispense: 60 tablet; Refill: 0 - cyclobenzaprine (FLEXERIL) 10 MG tablet; Take 1 tablet (10 mg total) by mouth 3 (three) times daily as needed for muscle spasms.  Dispense: 30 tablet; Refill: 0  4. Benign paroxysmal positional vertigo, bilateral  - meclizine (ANTIVERT) 25 MG tablet; Take 1 tablet (25 mg total) by mouth 3 (three) times daily as needed for dizziness.  Dispense: 30 tablet; Refill: 0 - COMPLETE METABOLIC PANEL WITH GFR  Patient have been counseled extensively about nutrition and exercise  Return in about 6 months (around 05/03/2015), or if symptoms worsen or fail to improve, for Asthma, Allergy.  The patient was given clear instructions to go to ER or return to medical center if symptoms don't improve, worsen or new problems develop. The patient verbalized understanding.  The patient was told to call to get lab results if they haven't heard anything in the next week.   This note has been created with Surveyor, quantity. Any transcriptional errors are unintentional.    Angelica Chessman, MD, [place], [place], [place], [place] and [place], [place]   10/31/2014, 12:27 PM

## 2014-11-08 ENCOUNTER — Ambulatory Visit: Payer: No Typology Code available for payment source

## 2014-11-08 ENCOUNTER — Telehealth: Payer: Self-pay | Admitting: Internal Medicine

## 2014-11-08 NOTE — Telephone Encounter (Signed)
Pt came into clinic requesting results. Please f/u with pt to review results.

## 2014-11-10 ENCOUNTER — Telehealth: Payer: Self-pay | Admitting: Emergency Medicine

## 2014-11-10 DIAGNOSIS — E789 Disorder of lipoprotein metabolism, unspecified: Secondary | ICD-10-CM

## 2014-11-10 NOTE — Telephone Encounter (Signed)
Pt given normal lab results States she thought cholesterol levels would be checked Scheduled pt for lab visit 11/13/14 @ 930am

## 2014-11-13 ENCOUNTER — Other Ambulatory Visit: Payer: No Typology Code available for payment source

## 2014-11-13 ENCOUNTER — Telehealth: Payer: Self-pay

## 2014-11-13 NOTE — Telephone Encounter (Signed)
Patient is aware of her lab results 

## 2014-11-13 NOTE — Telephone Encounter (Signed)
-----   Message from Tresa Garter, MD sent at 11/10/2014  6:10 PM EDT ----- Please inform patient that her laboratory test result is normal.

## 2014-12-01 ENCOUNTER — Other Ambulatory Visit: Payer: Self-pay | Admitting: Internal Medicine

## 2014-12-01 DIAGNOSIS — J441 Chronic obstructive pulmonary disease with (acute) exacerbation: Secondary | ICD-10-CM

## 2014-12-01 DIAGNOSIS — J45901 Unspecified asthma with (acute) exacerbation: Secondary | ICD-10-CM

## 2014-12-01 MED ORDER — ALBUTEROL SULFATE HFA 108 (90 BASE) MCG/ACT IN AERS: 2.0000 | INHALATION_SPRAY | Freq: Four times a day (QID) | RESPIRATORY_TRACT | Status: DC | PRN

## 2014-12-01 MED ORDER — FLUTICASONE-SALMETEROL 250-50 MCG/DOSE IN AEPB: 1.0000 | INHALATION_SPRAY | Freq: Two times a day (BID) | RESPIRATORY_TRACT | Status: DC

## 2015-01-18 ENCOUNTER — Ambulatory Visit: Payer: Self-pay | Attending: Internal Medicine | Admitting: Physician Assistant

## 2015-01-18 VITALS — BP 121/90 | HR 80 | Temp 98.4°F | Resp 16 | Wt 228.6 lb

## 2015-01-18 DIAGNOSIS — K2100 Gastro-esophageal reflux disease with esophagitis, without bleeding: Secondary | ICD-10-CM

## 2015-01-18 MED ORDER — ESOMEPRAZOLE MAGNESIUM 40 MG PO CPDR: 40.0000 mg | DELAYED_RELEASE_CAPSULE | Freq: Every day | ORAL | Status: DC

## 2015-01-18 NOTE — Progress Notes (Signed)
Here for abd cramping States she used to take Protonix but it is not helping States she would like a rx for Nexium  States she used Nexium and she felt better

## 2015-01-18 NOTE — Progress Notes (Signed)
Chief Complaint: reflux  Subjective:  this is a pleasant 54 year old female he he is coming in requesting a Nexium prescription. She states that she's been bothered by chest discomfort. She's had a lot of burning in her chest especially after eating a meal. She spoke with her sister who gave her Nexium tablets that she's been taking for one week with relief. She is now seeking a prescription of her own. She sometimes has stomach cramping. She sometimes has some loose stools and sometimes constipation as well. She's been having a lot of belching.Her symptoms have been going on intermittently for 3 weeks.   ROS:  GEN: denies fever or chills, denies change in weight Skin: denies lesions or rashes CV: + CP or no palpitations ABD: denies abd pain, N or V    Objective:  Filed Vitals:   01/18/15 1106  BP: 121/90  Pulse: 80  Temp: 98.4 F (36.9 C)  TempSrc: Oral  Resp: 16  Weight: 228 lb 9.6 oz (103.692 kg)  SpO2: 96%    Physical Exam:  General: in no acute distress. Heart: Normal  s1 &s2  Regular rate and rhythm, without murmurs, rubs, gallops. Lungs: Clear to auscultation bilaterally. Abdomen: Soft, nontender, nondistended, positive bowel sounds.   Medications: Prior to Admission medications   Medication Sig Start Date End Date Taking? Authorizing Provider  albuterol (PROVENTIL HFA;VENTOLIN HFA) 108 (90 BASE) MCG/ACT inhaler Inhale 2 puffs into the lungs every 6 (six) hours as needed for wheezing. 12/01/14   Tresa Garter, MD  cyclobenzaprine (FLEXERIL) 10 MG tablet Take 1 tablet (10 mg total) by mouth 3 (three) times daily as needed for muscle spasms. 10/31/14   Tresa Garter, MD  doxycycline (VIBRA-TABS) 100 MG tablet Take 1 tablet (100 mg total) by mouth 2 (two) times daily. Patient not taking: Reported on 10/31/2014 04/27/14   Tresa Garter, MD  esomeprazole (NEXIUM) 40 MG capsule Take 1 capsule (40 mg total) by mouth daily. 01/18/15   Shaunak Kreis Daneil Dan, PA-C   fluticasone (FLONASE) 50 MCG/ACT nasal spray Place 2 sprays into both nostrils daily. 10/31/14   Tresa Garter, MD  Fluticasone-Salmeterol (ADVAIR DISKUS) 250-50 MCG/DOSE AEPB Inhale 1 puff into the lungs 2 (two) times daily. 12/01/14   Tresa Garter, MD  furosemide (LASIX) 20 MG tablet Take 1 tablet (20 mg total) by mouth daily. 10/31/14   Tresa Garter, MD  guaiFENesin-dextromethorphan (ROBITUSSIN DM) 100-10 MG/5ML syrup Take 5 mLs by mouth every 4 (four) hours as needed for cough. Patient not taking: Reported on 10/31/2014 04/27/14   Tresa Garter, MD  loratadine (CLARITIN) 10 MG tablet Take 1 tablet (10 mg total) by mouth daily. 10/31/14   Tresa Garter, MD  meclizine (ANTIVERT) 25 MG tablet Take 1 tablet (25 mg total) by mouth 3 (three) times daily as needed for dizziness. 10/31/14   Tresa Garter, MD  naproxen (NAPROSYN) 500 MG tablet Take 1 tablet (500 mg total) by mouth 2 (two) times daily with a meal. 10/31/14   Tresa Garter, MD  ondansetron (ZOFRAN-ODT) 8 MG disintegrating tablet Take 1 tablet (8 mg total) by mouth every 8 (eight) hours as needed for nausea or vomiting. Patient not taking: Reported on 10/31/2014 10/01/13   Liam Graham, PA-C  predniSONE (DELTASONE) 10 MG tablet Take 1 tablet (10 mg total) by mouth daily with breakfast. Patient not taking: Reported on 10/31/2014 04/27/14   Tresa Garter, MD    Assessment/Plan: 1. GERD -behavioral modification -  Nexium 40 mg daily   Follow up: Keep Sept appt with Dr. Doreene Burke as scheduled  The patient was given clear instructions to go to ER or return to medical center if symptoms don't improve, worsen or new problems develop. The patient verbalized understanding. The patient was told to call to get lab results if they haven't heard anything in the next week.   This note has been created with Surveyor, quantity. Any transcriptional errors are  unintentional.   Zettie Pho, PA-C 01/18/2015, 11:26 AM

## 2015-05-14 ENCOUNTER — Telehealth: Payer: Self-pay | Admitting: Internal Medicine

## 2015-05-14 NOTE — Telephone Encounter (Signed)
Patient is requesting a refill on naproxen (NAPROSYN) 500 MG tablet. Please follow up with pt. Patient is currently in pain on her back and ankles.

## 2015-05-16 ENCOUNTER — Other Ambulatory Visit: Payer: Self-pay | Admitting: *Deleted

## 2015-05-16 DIAGNOSIS — M1990 Unspecified osteoarthritis, unspecified site: Secondary | ICD-10-CM

## 2015-05-16 MED ORDER — NAPROXEN 500 MG PO TABS: 500.0000 mg | ORAL_TABLET | Freq: Two times a day (BID) | ORAL | Status: DC

## 2015-05-16 NOTE — Telephone Encounter (Signed)
Patient's Naproxen reordered with 3 refills. Patient verified DOB Patient made aware of reorder. Patient had no further questions.

## 2015-05-22 ENCOUNTER — Ambulatory Visit: Payer: PRIVATE HEALTH INSURANCE | Admitting: Pharmacist

## 2015-05-23 ENCOUNTER — Ambulatory Visit: Payer: PRIVATE HEALTH INSURANCE | Admitting: Pharmacist

## 2015-05-23 ENCOUNTER — Other Ambulatory Visit: Payer: Self-pay | Admitting: Pharmacist

## 2015-05-23 NOTE — Progress Notes (Signed)
Called Ms. Letendre to verify that she wanted to receive Zostavax despite being <55 years old (though it is indicated in those 31 and up).  She reported that she did but wanted to know the cost. It was run through her insurance as a test claim and the copay was $150. Patient decided against the vaccination at this time.  Nicoletta Ba, PharmD, BCPS, [place] [facility] [phone]

## 2015-09-11 MED FILL — NAPROXEN 500 MG TABLET: 500 | 30 days supply | Qty: 60 | Fill #1

## 2015-09-11 MED FILL — ?ESOMEPRAZOLE MAG DR 40 MG: 40 MG | 30 days supply | Qty: 30 | Fill #1

## 2015-09-24 ENCOUNTER — Encounter (HOSPITAL_COMMUNITY): Payer: Self-pay

## 2015-12-05 ENCOUNTER — Ambulatory Visit: Payer: PRIVATE HEALTH INSURANCE | Attending: Internal Medicine | Admitting: Physician Assistant

## 2015-12-05 DIAGNOSIS — R112 Nausea with vomiting, unspecified: Secondary | ICD-10-CM

## 2015-12-05 DIAGNOSIS — A084 Viral intestinal infection, unspecified: Secondary | ICD-10-CM

## 2015-12-05 DIAGNOSIS — R197 Diarrhea, unspecified: Secondary | ICD-10-CM | POA: Diagnosis not present

## 2015-12-05 MED ORDER — ONDANSETRON 8 MG PO TBDP: 8.0000 mg | ORAL_TABLET | Freq: Three times a day (TID) | ORAL | Status: DC | PRN

## 2015-12-05 MED ORDER — ACETAMINOPHEN-CODEINE #3 300-30 MG PO TABS: 1.0000 | ORAL_TABLET | ORAL | Status: DC | PRN

## 2015-12-05 MED FILL — ACETAMINOPHEN/COD #3 TABLET: 300-30 | 10 days supply | Qty: 10 | Fill #0

## 2015-12-05 NOTE — Progress Notes (Signed)
Patient is here for Vomiting and Diarrhea  Patient complains of V/D being present since 3pm on yesterday. Patient last V/D was this morning. Patient has taken imodium since 3 am this morning. Patient has had minimal relief.

## 2015-12-05 NOTE — Progress Notes (Signed)
Patient ID: Jacqueline Good, female   DOB: 1961/03/03, 55 y.o.   MRN: [id number]   Jacqueline Good, is a 55 y.o. female  [id number]  [id number]  DOB - [birth year]  No chief complaint on file.       Subjective:  Chief Complaint and HPI: Jacqueline Good is a 55 y.o. female here today for nausea, vomiting, and diarrhea since about 3am.  Denies recent travel or undercooked foods.  Diarrhea is watery and not bloody.  She has vomited and/or diarrhea about 7 times since 3am.  The diarrhea has slowed down since starting OTC immodium.  She works at a nursing home.  She admits to some general and mild abdominal cramping without abdominal pain.  She still has her appendix and gallbladder.  She felt fine up until this started.  Denies dysuria or frequency. She c/o headache and body aches as well.  No f/c.   ROS:   Constitutional:  No f/c, No night sweats, No unexplained weight loss. EENT:  No vision changes, No blurry vision, No hearing changes. No mouth, throat, or ear problems.  Respiratory: No cough, No SOB Cardiac: No CP, no palpitations GI:  No abd pain, No N/V/D. GU: No Urinary s/sx Musculoskeletal: No joint pain Neuro: No headache, no dizziness, no motor weakness.  Skin: No rash Endocrine:  No polydipsia. No polyuria.  Psych: Denies SI/HI  ALLERGIES: No Known Allergies  PAST MEDICAL HISTORY: Past Medical History  Diagnosis Date  . Asthma   . Depression   . Arthritis     MEDICATIONS AT HOME: Prior to Admission medications   Medication Sig Start Date End Date Taking? Authorizing Provider  acetaminophen-codeine (TYLENOL #3) 300-30 MG tablet Take 1 tablet by mouth every 4 (four) hours as needed for moderate pain. 12/05/15   Argentina Donovan, PA-C  albuterol (PROVENTIL HFA;VENTOLIN HFA) 108 (90 BASE) MCG/ACT inhaler Inhale 2 puffs into the lungs every 6 (six) hours as needed for wheezing. 12/01/14   Tresa Garter, MD  cyclobenzaprine (FLEXERIL) 10 MG tablet Take 1 tablet (10  mg total) by mouth 3 (three) times daily as needed for muscle spasms. 10/31/14   Tresa Garter, MD  doxycycline (VIBRA-TABS) 100 MG tablet Take 1 tablet (100 mg total) by mouth 2 (two) times daily. Patient not taking: Reported on 10/31/2014 04/27/14   Tresa Garter, MD  esomeprazole (NEXIUM) 40 MG capsule Take 1 capsule (40 mg total) by mouth daily. 01/18/15   Tiffany Daneil Dan, PA-C  fluticasone (FLONASE) 50 MCG/ACT nasal spray Place 2 sprays into both nostrils daily. 10/31/14   Tresa Garter, MD  Fluticasone-Salmeterol (ADVAIR DISKUS) 250-50 MCG/DOSE AEPB Inhale 1 puff into the lungs 2 (two) times daily. 12/01/14   Tresa Garter, MD  furosemide (LASIX) 20 MG tablet Take 1 tablet (20 mg total) by mouth daily. 10/31/14   Tresa Garter, MD  guaiFENesin-dextromethorphan (ROBITUSSIN DM) 100-10 MG/5ML syrup Take 5 mLs by mouth every 4 (four) hours as needed for cough. Patient not taking: Reported on 10/31/2014 04/27/14   Tresa Garter, MD  loratadine (CLARITIN) 10 MG tablet Take 1 tablet (10 mg total) by mouth daily. 10/31/14   Tresa Garter, MD  meclizine (ANTIVERT) 25 MG tablet Take 1 tablet (25 mg total) by mouth 3 (three) times daily as needed for dizziness. 10/31/14   Tresa Garter, MD  naproxen (NAPROSYN) 500 MG tablet Take 1 tablet (500 mg total) by mouth 2 (two) times daily with a meal. 05/16/15  Tresa Garter, MD  ondansetron (ZOFRAN-ODT) 8 MG disintegrating tablet Take 1 tablet (8 mg total) by mouth every 8 (eight) hours as needed for nausea or vomiting. 12/05/15   Argentina Donovan, PA-C  predniSONE (DELTASONE) 10 MG tablet Take 1 tablet (10 mg total) by mouth daily with breakfast. Patient not taking: Reported on 10/31/2014 04/27/14   Tresa Garter, MD     Objective:  EXAM:  BP 105/70 Pulse 71 Resp 18 Pulse ox 96%  General appearance : A&OX3. NAD. Non-toxic-appearing HEENT: Atraumatic and Normocephalic.  PERRLA. EOM intact.  Mouth-MMM, post  pharynx WNL  Neck: supple, no JVD. No cervical lymphadenopathy. No thyromegaly Chest/Lungs:  Breathing-non-labored, Good air entry bilaterally, breath sounds normal without rales, rhonchi, or wheezing  CVS: S1 S2 regular, no murmurs, gallops, rubs  Abdomen: Bowel sounds present, Non tender and not distended with no gaurding, rigidity or rebound. Extremities: Bilateral Lower Ext shows no edema, both legs are warm to touch with = pulse throughout Neurology:  CN II-XII grossly intact, Non focal.   Psych:  TP linear. J/I WNL. Normal speech. Appropriate eye contact and affect.  Skin:  No Rash  Data Review Lab Results  Component Value Date   HGBA1C 5.4 04/27/2014     Assessment & Plan   1. Non-intractable vomiting with nausea, unspecified vomiting type Zofran per prescription Hydration imperative I also gave her a prescription for a few Tylenol #3 for bodyaches and headache.  Rehydration should help with her headache.    2. Diarrhea, unspecified type Immodium per package Hydration imperative  3. Viral gastroenteritis Fluids and rest.  Watch for fever or abdominal pain.  She should stay out of work today and likely tomorrow, but she is only requesting a note for today.  No s/sx of acute abdomen or toxicity.   Patient have been counseled extensively about nutrition and exercise  Return in about 3 months (around 03/05/2016) for pcp visit. and bloodwork.  Recheck here in 24 hours if not improving; to ER if worsens or is severe.   The patient was given clear instructions to go to ER or return to medical center if symptoms don't improve, worsen or new problems develop. The patient verbalized understanding. The patient was told to call to get lab results if they haven't heard anything in the next week.     Freeman Caldron, PA-C [facility] [place], [place]   12/05/2015, 12:15 PM

## 2015-12-05 NOTE — Patient Instructions (Signed)

## 2016-01-15 MED FILL — ESOMEPRAZOLE MAG DR 40 MG C: 40 | 30 days supply | Qty: 30 | Fill #2

## 2016-01-24 ENCOUNTER — Other Ambulatory Visit: Payer: Self-pay | Admitting: Internal Medicine

## 2016-01-24 ENCOUNTER — Other Ambulatory Visit: Payer: Self-pay

## 2016-01-24 DIAGNOSIS — Z1231 Encounter for screening mammogram for malignant neoplasm of breast: Secondary | ICD-10-CM

## 2016-01-28 ENCOUNTER — Other Ambulatory Visit: Payer: Self-pay | Admitting: Physician Assistant

## 2016-01-28 MED FILL — NAPROXEN 500 MG TABLET: 500 | 30 days supply | Qty: 60 | Fill #2

## 2016-01-31 ENCOUNTER — Ambulatory Visit: Payer: PRIVATE HEALTH INSURANCE

## 2016-02-20 ENCOUNTER — Ambulatory Visit
Admission: RE | Admit: 2016-02-20 | Discharge: 2016-02-20 | Disposition: A | Discharge status: Home / Self Care | Payer: PRIVATE HEALTH INSURANCE | Source: Ambulatory Visit | Attending: Internal Medicine | Admitting: Internal Medicine

## 2016-02-20 DIAGNOSIS — Z1231 Encounter for screening mammogram for malignant neoplasm of breast: Secondary | ICD-10-CM

## 2016-02-29 ENCOUNTER — Telehealth: Payer: Self-pay | Admitting: *Deleted

## 2016-02-29 NOTE — Telephone Encounter (Signed)
Medical Assistant left message on patient's home and cell voicemail. Voicemail states to give a call back to [place] with [facility] at [phone].  !!!Please inform patient of mammogram being normal and patient advised to have a repeat completed in one year!!!

## 2016-02-29 NOTE — Telephone Encounter (Signed)
-----   Message from Tresa Garter, MD sent at 02/27/2016  9:31 AM EDT ----- Please inform patient that her screening mammogram shows no evidence of malignancy. Recommend screening mammogram in one year

## 2016-03-12 ENCOUNTER — Other Ambulatory Visit: Payer: Self-pay | Admitting: Internal Medicine

## 2016-03-12 DIAGNOSIS — J441 Chronic obstructive pulmonary disease with (acute) exacerbation: Secondary | ICD-10-CM

## 2016-03-12 DIAGNOSIS — J45901 Unspecified asthma with (acute) exacerbation: Secondary | ICD-10-CM

## 2016-03-12 MED FILL — VENTOLIN HFA 90 MCG INHALER: 108 (90 BAS | 20 days supply | Qty: 18 | Fill #0

## 2016-03-27 ENCOUNTER — Ambulatory Visit: Payer: PRIVATE HEALTH INSURANCE | Attending: Internal Medicine | Admitting: Internal Medicine

## 2016-03-27 VITALS — BP 113/80 | HR 83 | Temp 98.3°F | Resp 18 | Ht 64.0 in | Wt 228.2 lb

## 2016-03-27 DIAGNOSIS — Z6839 Body mass index (BMI) 39.0-39.9, adult: Secondary | ICD-10-CM | POA: Insufficient documentation

## 2016-03-27 DIAGNOSIS — F4322 Adjustment disorder with anxiety: Secondary | ICD-10-CM | POA: Diagnosis not present

## 2016-03-27 DIAGNOSIS — Z7951 Long term (current) use of inhaled steroids: Secondary | ICD-10-CM | POA: Insufficient documentation

## 2016-03-27 DIAGNOSIS — J441 Chronic obstructive pulmonary disease with (acute) exacerbation: Secondary | ICD-10-CM

## 2016-03-27 DIAGNOSIS — J45901 Unspecified asthma with (acute) exacerbation: Secondary | ICD-10-CM | POA: Diagnosis not present

## 2016-03-27 DIAGNOSIS — F329 Major depressive disorder, single episode, unspecified: Secondary | ICD-10-CM | POA: Insufficient documentation

## 2016-03-27 DIAGNOSIS — M1990 Unspecified osteoarthritis, unspecified site: Secondary | ICD-10-CM | POA: Insufficient documentation

## 2016-03-27 MED ORDER — TRAZODONE HCL 50 MG PO TABS: 25.0000 mg | ORAL_TABLET | Freq: Every evening | ORAL | 3 refills | Status: DC | PRN

## 2016-03-27 MED ORDER — ALPRAZOLAM 0.25 MG PO TABS: 0.2500 mg | ORAL_TABLET | Freq: Two times a day (BID) | ORAL | 0 refills | Status: DC | PRN

## 2016-03-27 MED ORDER — FLUTICASONE PROPIONATE 50 MCG/ACT NA SUSP: 2.0000 | Freq: Every day | NASAL | 6 refills | Status: DC

## 2016-03-27 MED ORDER — FUROSEMIDE 20 MG PO TABS: 20.0000 mg | ORAL_TABLET | Freq: Every day | ORAL | 3 refills | Status: DC

## 2016-03-27 MED FILL — traZODone HCL 50 MG TABS: 50 | 30 days supply | Qty: 30 | Fill #0

## 2016-03-27 NOTE — Patient Instructions (Signed)
  Adjustment Disorder Adjustment disorder is an unusually severe reaction to a stressful life event, such as the loss of a job or physical illness. The event may be any stressful event other than the loss of a loved one. Adjustment disorder may affect your feelings, your thinking, how you act, or a combination of these. It may interfere with personal relationships or with the way you are at work, school, or home. People with this disorder are at risk for suicide and substance abuse. They may develop a more serious mental disorder, such as major depressive disorder or post-traumatic stress disorder. SIGNS AND SYMPTOMS  Symptoms may include:  Sadness, depressed mood, or crying spells.  Loss of enjoyment.  Change in appetite or weight.  Sense of loss or hopelessness.  Thoughts of suicide.  Anxiety, worry, or nervousness.  Trouble sleeping.  Avoiding family and friends.  Poor school performance.  Fighting or vandalism.  Reckless driving.  Skipping school.  Poor work performance.  Ignoring bills. Symptoms of adjustment disorder start within 3 months of the stressful life event. They do not last more than 6 months after the event has ended. DIAGNOSIS  To make a diagnosis, your health care provider will ask about what has happened in your life and how it has affected you. He or she may also ask about your medical history and use of medicines, alcohol, and other substances. Your health care provider may do a physical exam and order lab tests or other studies. You may be referred to a mental health specialist for evaluation. TREATMENT  Treatment options include:  Counseling or talk therapy. Talk therapy is usually provided by mental health specialists.  Medicine. Certain medicines may help with depression, anxiety, and sleep.  Support groups. Support groups offer emotional support, advice, and guidance. They are made up of people who have had similar experiences. HOME CARE  INSTRUCTIONS  Keep all follow-up visits as directed by your health care provider. This is important.  Take medicines only as directed by your health care provider. SEEK MEDICAL CARE IF:  Your symptoms get worse.  SEEK IMMEDIATE MEDICAL CARE IF: You have serious thoughts about hurting yourself or someone else. MAKE SURE YOU:  Understand these instructions.  Will watch your condition.  Will get help right away if you are not doing well or get worse.   This information is not intended to replace advice given to you by your health care provider. Make sure you discuss any questions you have with your health care provider.   Document Released: 04/01/2006 Document Revised: 08/18/2014 Document Reviewed: 12/20/2013 Elsevier Interactive Patient Education 2016 Elsevier Inc.  

## 2016-03-27 NOTE — Progress Notes (Signed)
Patient is here for anxiety FU  Patient denies pain at this time.  Patient has taken medication and patient has eaten.  Patient request refill on Nasal Spray and Lasix.  Patient denies any suicidal ideations at this time.

## 2016-03-27 NOTE — Progress Notes (Signed)
Jacqueline Good, is a 55 y.o. female  [id number]  [id number]  DOB - [birth year]  Chief Complaint  Patient presents with  . Anxiety        Subjective:   Jacqueline Good is a 55 y.o. female history of asthma, osteoarthritis, seasonal allergy and major depression here today for a follow up visit and medication refills. Patient reports significant anxiety and depression as a result of her current condition including social and financial. As a result of this she is not able to sleep well at night. She is gaining so much weight as well as some leg edema, she has been on furosemide which helps. She reports no fall or injury, no joint swelling or redness. She denies any pain. She denies suicidal ideations or thoughts. Patient has No headache, No chest pain, No abdominal pain - No Nausea, No new weakness tingling or numbness, No Cough - SOB.  Problem  Adjustment Disorder With Anxious Mood    ALLERGIES: No Known Allergies  PAST MEDICAL HISTORY: Past Medical History:  Diagnosis Date  . Arthritis   . Asthma   . Depression     MEDICATIONS AT HOME: Prior to Admission medications   Medication Sig Start Date End Date Taking? Authorizing Provider  acetaminophen-codeine (TYLENOL #3) 300-30 MG tablet Take 1 tablet by mouth every 4 (four) hours as needed for moderate pain. 12/05/15   Argentina Donovan, PA-C  ALPRAZolam Duanne Moron) 0.25 MG tablet Take 1 tablet (0.25 mg total) by mouth 2 (two) times daily as needed for anxiety. 03/27/16   Tresa Garter, MD  esomeprazole (NEXIUM) 40 MG capsule TAKE ONE CAPSULE BY MOUTH DAILY 01/28/16   Tresa Garter, MD  fluticasone (FLONASE) 50 MCG/ACT nasal spray Place 2 sprays into both nostrils daily. 03/27/16   Tresa Garter, MD  Fluticasone-Salmeterol (ADVAIR DISKUS) 250-50 MCG/DOSE AEPB Inhale 1 puff into the lungs 2 (two) times daily. 12/01/14   Tresa Garter, MD  furosemide (LASIX) 20 MG tablet Take 1 tablet (20 mg total) by mouth daily.  03/27/16   Tresa Garter, MD  loratadine (CLARITIN) 10 MG tablet Take 1 tablet (10 mg total) by mouth daily. 10/31/14   Tresa Garter, MD  meclizine (ANTIVERT) 25 MG tablet Take 1 tablet (25 mg total) by mouth 3 (three) times daily as needed for dizziness. 10/31/14   Tresa Garter, MD  naproxen (NAPROSYN) 500 MG tablet Take 1 tablet (500 mg total) by mouth 2 (two) times daily with a meal. 05/16/15   Tresa Garter, MD  ondansetron (ZOFRAN-ODT) 8 MG disintegrating tablet Take 1 tablet (8 mg total) by mouth every 8 (eight) hours as needed for nausea or vomiting. 12/05/15   Argentina Donovan, PA-C  traZODone (DESYREL) 50 MG tablet Take 0.5-1 tablets (25-50 mg total) by mouth at bedtime as needed for sleep. 03/27/16   Tresa Garter, MD  VENTOLIN HFA 108 (90 Base) MCG/ACT inhaler INHALE 2 PUFFS INTO THE LUNGS EVERY 6 HOURS AS NEEDED FOR WHEEZING 03/12/16   Tresa Garter, MD     Objective:   Vitals:   03/27/16 1517  BP: 113/80  Pulse: 83  Resp: 18  Temp: 98.3 F (36.8 C)  TempSrc: Oral  SpO2: 97%  Weight: 228 lb 3.2 oz (103.5 kg)  Height: 5\' 4"  (1.626 m)    Exam General appearance : Awake, alert, not in any distress. Speech Clear. Not toxic looking HEENT: Atraumatic and Normocephalic, pupils equally reactive to light and accomodation  Neck: Supple, no JVD. No cervical lymphadenopathy.  Chest: Good air entry bilaterally, no added sounds  CVS: S1 S2 regular, no murmurs.  Abdomen: Bowel sounds present, Non tender and not distended with no gaurding, rigidity or rebound. Extremities: B/L Lower Ext shows + edema, both legs are warm to touch Neurology: Awake alert, and oriented X 3, CN II-XII intact, Non focal Skin: No Rash  Data Review Lab Results  Component Value Date   HGBA1C 5.4 04/27/2014     Assessment & Plan   1. Adjustment disorder with anxious mood  - traZODone (DESYREL) 50 MG tablet; Take 0.5-1 tablets (25-50 mg total) by mouth at bedtime as needed  for sleep.  Dispense: 30 tablet; Refill: 3 - ALPRAZolam (XANAX) 0.25 MG tablet; Take 1 tablet (0.25 mg total) by mouth 2 (two) times daily as needed for anxiety.  Dispense: 30 tablet; Refill: 0  2. Asthma, chronic obstructive, with acute exacerbation (HCC)  - fluticasone (FLONASE) 50 MCG/ACT nasal spray; Place 2 sprays into both nostrils daily.  Dispense: 16 g; Refill: 6  3. Morbid obesity due to excess calories (HCC)  - furosemide (LASIX) 20 MG tablet; Take 1 tablet (20 mg total) by mouth daily.  Dispense: 30 tablet; Refill: 3  Patient have been counseled extensively about nutrition and exercise  Return in about 3 months (around 06/27/2016) for Routine Follow Up.  The patient was given clear instructions to go to ER or return to medical center if symptoms don't improve, worsen or new problems develop. The patient verbalized understanding. The patient was told to call to get lab results if they haven't heard anything in the next week.   This note has been created with Surveyor, quantity. Any transcriptional errors are unintentional.    Angelica Chessman, MD, [place], Karilyn Cota, [place] and [place], [place]   03/27/2016, 3:51 PM

## 2016-03-31 MED FILL — FLUTICASONE PROP 50 MCG SPR: 50 | 20 days supply | Qty: 16 | Fill #0

## 2016-03-31 MED FILL — ESOMEPRAZOLE MAG DR 40 MG C: 40 | 30 days supply | Qty: 30 | Fill #0

## 2016-04-07 MED FILL — NAPROXEN 500 MG TABLET: 500 | 30 days supply | Qty: 60 | Fill #3

## 2016-04-14 ENCOUNTER — Encounter: Payer: Self-pay | Admitting: Internal Medicine

## 2016-04-18 ENCOUNTER — Other Ambulatory Visit: Payer: Self-pay | Admitting: *Deleted

## 2016-04-18 DIAGNOSIS — J45901 Unspecified asthma with (acute) exacerbation: Secondary | ICD-10-CM

## 2016-04-18 DIAGNOSIS — J441 Chronic obstructive pulmonary disease with (acute) exacerbation: Secondary | ICD-10-CM

## 2016-04-18 MED ORDER — ALBUTEROL SULFATE HFA 108 (90 BASE) MCG/ACT IN AERS: INHALATION_SPRAY | RESPIRATORY_TRACT | 3 refills | Status: DC

## 2016-04-18 NOTE — Telephone Encounter (Signed)
PRINTED FOR PASS PROGRAM 

## 2016-04-23 ENCOUNTER — Other Ambulatory Visit: Payer: Self-pay | Admitting: *Deleted

## 2016-04-23 ENCOUNTER — Telehealth: Payer: Self-pay | Admitting: *Deleted

## 2016-04-23 DIAGNOSIS — F4322 Adjustment disorder with anxiety: Secondary | ICD-10-CM

## 2016-04-23 NOTE — Telephone Encounter (Signed)
Patient verified DOB Patient states she has been taking the XANAX twice a day as needed. Patient is requesting a refill and states she does not see any improvement in her situation or anxiety while taking the medication. Patient is requesting a higher dosage or possible alternate. MA informed patient of PCP being OOO until tomorrow and returning to a full clinic. Patient is aware of MA providing a FU no later than Friday with advice from PCP. Patient expresses her gratitude and understanding.

## 2016-04-25 ENCOUNTER — Telehealth: Payer: Self-pay | Admitting: Internal Medicine

## 2016-04-25 NOTE — Telephone Encounter (Signed)
Patient verified DOB MA spoke with patient and informed her of PCP refusing the request for a refill due to patient needing an OV for assessment. Patient expressed her understanding and was advised to call [facility] to schedule an appointment with PCP. No further questions at this time.

## 2016-04-25 NOTE — Telephone Encounter (Signed)
Pt calling in regards to her Xanax refill  Pt requesting to speak to nurse

## 2016-05-01 ENCOUNTER — Ambulatory Visit: Payer: PRIVATE HEALTH INSURANCE | Attending: Internal Medicine | Admitting: Internal Medicine

## 2016-05-01 ENCOUNTER — Encounter: Payer: Self-pay | Admitting: Internal Medicine

## 2016-05-01 VITALS — BP 97/63 | HR 63 | Temp 98.0°F | Resp 17 | Ht 65.0 in | Wt 221.4 lb

## 2016-05-01 DIAGNOSIS — F4322 Adjustment disorder with anxiety: Secondary | ICD-10-CM

## 2016-05-01 MED ORDER — ALPRAZOLAM 0.5 MG PO TABS: 0.5000 mg | ORAL_TABLET | Freq: Two times a day (BID) | ORAL | 0 refills | Status: DC | PRN

## 2016-05-01 NOTE — Patient Instructions (Signed)
  Adjustment Disorder Adjustment disorder is an unusually severe reaction to a stressful life event, such as the loss of a job or physical illness. The event may be any stressful event other than the loss of a loved one. Adjustment disorder may affect your feelings, your thinking, how you act, or a combination of these. It may interfere with personal relationships or with the way you are at work, school, or home. People with this disorder are at risk for suicide and substance abuse. They may develop a more serious mental disorder, such as major depressive disorder or post-traumatic stress disorder. SIGNS AND SYMPTOMS  Symptoms may include:  Sadness, depressed mood, or crying spells.  Loss of enjoyment.  Change in appetite or weight.  Sense of loss or hopelessness.  Thoughts of suicide.  Anxiety, worry, or nervousness.  Trouble sleeping.  Avoiding family and friends.  Poor school performance.  Fighting or vandalism.  Reckless driving.  Skipping school.  Poor work performance.  Ignoring bills. Symptoms of adjustment disorder start within 3 months of the stressful life event. They do not last more than 6 months after the event has ended. DIAGNOSIS  To make a diagnosis, your health care provider will ask about what has happened in your life and how it has affected you. He or she may also ask about your medical history and use of medicines, alcohol, and other substances. Your health care provider may do a physical exam and order lab tests or other studies. You may be referred to a mental health specialist for evaluation. TREATMENT  Treatment options include:  Counseling or talk therapy. Talk therapy is usually provided by mental health specialists.  Medicine. Certain medicines may help with depression, anxiety, and sleep.  Support groups. Support groups offer emotional support, advice, and guidance. They are made up of people who have had similar experiences. HOME CARE  INSTRUCTIONS  Keep all follow-up visits as directed by your health care provider. This is important.  Take medicines only as directed by your health care provider. SEEK MEDICAL CARE IF:  Your symptoms get worse.  SEEK IMMEDIATE MEDICAL CARE IF: You have serious thoughts about hurting yourself or someone else. MAKE SURE YOU:  Understand these instructions.  Will watch your condition.  Will get help right away if you are not doing well or get worse.   This information is not intended to replace advice given to you by your health care provider. Make sure you discuss any questions you have with your health care provider.   Document Released: 04/01/2006 Document Revised: 08/18/2014 Document Reviewed: 12/20/2013 Elsevier Interactive Patient Education 2016 Elsevier Inc.  

## 2016-05-01 NOTE — Progress Notes (Signed)
Pt is in today for follow  up  Pt denies pain  Pt has eaten today and taken medications  Pt does not have menstrual cycle due to ablation  Pt needs refills of tylenol and xanax

## 2016-05-01 NOTE — Progress Notes (Signed)
Jacqueline Good, is a 55 y.o. female  [id number]  [id number]  DOB - Jan 09, 1961  Chief Complaint  Patient presents with  . Follow-up      Subjective:   Jacqueline Good is a 55 y.o. female with history of asthma, osteoarthritis, seasonal allergy and major depression here today for a follow up visit and medication refills. Patient reports significant ongoing anxiety and depression as a result of her current social and financial conditions. As a result of this she is not able to sleep well at night. She is gaining so much weight as well as some leg edema, she has been on furosemide which helps. She reports no fall or injury, no joint swelling or redness. She denies any pain. She denies suicidal ideations or thoughts. She requests increase in her xanax dosage from 0.25 mg to 0.5 mg to help with her acute anxiety attacks. She has no chest pain, no cough or SOB. No abdominal pain, no N/V/D. No urinary symptom.  No problems updated.  ALLERGIES: No Known Allergies  PAST MEDICAL HISTORY: Past Medical History:  Diagnosis Date  . Arthritis   . Asthma   . Depression    MEDICATIONS AT HOME: Prior to Admission medications   Medication Sig Start Date End Date Taking? Authorizing Provider  acetaminophen-codeine (TYLENOL #3) 300-30 MG tablet Take 1 tablet by mouth every 4 (four) hours as needed for moderate pain. 12/05/15  Yes Dionne Bucy McClung, PA-C  albuterol (VENTOLIN HFA) 108 (90 Base) MCG/ACT inhaler INHALE 2 PUFFS INTO THE LUNGS EVERY 6 HOURS AS NEEDED FOR WHEEZING 04/18/16  Yes Sinia Antosh Essie Christine, MD  ALPRAZolam (XANAX) 0.5 MG tablet Take 1 tablet (0.5 mg total) by mouth 2 (two) times daily as needed for anxiety. 05/01/16  Yes Tresa Garter, MD  esomeprazole (NEXIUM) 40 MG capsule TAKE ONE CAPSULE BY MOUTH DAILY 01/28/16  Yes Tresa Garter, MD  fluticasone (FLONASE) 50 MCG/ACT nasal spray Place 2 sprays into both nostrils daily. 03/27/16  Yes Tresa Garter, MD    Fluticasone-Salmeterol (ADVAIR DISKUS) 250-50 MCG/DOSE AEPB Inhale 1 puff into the lungs 2 (two) times daily. 12/01/14  Yes Tresa Garter, MD  furosemide (LASIX) 20 MG tablet Take 1 tablet (20 mg total) by mouth daily. 03/27/16  Yes Tresa Garter, MD  loratadine (CLARITIN) 10 MG tablet Take 1 tablet (10 mg total) by mouth daily. 10/31/14  Yes Tresa Garter, MD  meclizine (ANTIVERT) 25 MG tablet Take 1 tablet (25 mg total) by mouth 3 (three) times daily as needed for dizziness. 10/31/14  Yes Tresa Garter, MD  naproxen (NAPROSYN) 500 MG tablet Take 1 tablet (500 mg total) by mouth 2 (two) times daily with a meal. 05/16/15  Yes Tresa Garter, MD  ondansetron (ZOFRAN-ODT) 8 MG disintegrating tablet Take 1 tablet (8 mg total) by mouth every 8 (eight) hours as needed for nausea or vomiting. 12/05/15  Yes Argentina Donovan, PA-C  traZODone (DESYREL) 50 MG tablet Take 0.5-1 tablets (25-50 mg total) by mouth at bedtime as needed for sleep. Patient not taking: Reported on 05/01/2016 03/27/16   Tresa Garter, MD   Objective:   Vitals:   05/01/16 1516  BP: 97/63  Pulse: 63  Resp: 17  Temp: 98 F (36.7 C)  TempSrc: Oral  SpO2: 97%  Weight: 221 lb 6.4 oz (100.4 kg)  Height: 5\' 5"  (1.651 m)   Exam General appearance : Awake, alert, not in any distress. Speech Clear. Not toxic looking  HEENT: Atraumatic and Normocephalic, pupils equally reactive to light and accomodation Neck: Supple, no JVD. No cervical lymphadenopathy.  Chest: Good air entry bilaterally, no added sounds  CVS: S1 S2 regular, no murmurs.  Abdomen: Bowel sounds present, Non tender and not distended with no gaurding, rigidity or rebound. Extremities: B/L Lower Ext shows no edema, both legs are warm to touch Neurology: Awake alert, and oriented X 3, CN II-XII intact, Non focal Skin: No Rash  Data Review Lab Results  Component Value Date   HGBA1C 5.4 04/27/2014   Assessment & Plan   1. Adjustment  disorder with anxious mood  - Increase dose of Xanax to 0.5 mg BID PRN and refer to Psychiatry for on-going refill if necessary - Continue antidepressant  - ALPRAZolam (XANAX) 0.5 MG tablet; Take 1 tablet (0.5 mg total) by mouth 2 (two) times daily as needed for anxiety.  Dispense: 60 tablet; Refill: 0  - Ambulatory referral to Psychiatry  2. Morbid obesity due to excess calories (HCC)  Aim for 30 minutes of exercise most days. Rethink what you drink.Water is great! Aim for 2-3 Carb Choices per meal (30-45 grams) +/- 1 either way  Aim for 0-15 Carbs per snack if hungry  Include protein in moderation with your meals and snacks  Consider reading food labels for Total Carbohydrate and Fat Grams of foods  Consider checking BG at alternate times per day  Continue taking medication as directed Be mindful about how much sugar you are adding to beverages and other foods. Fruit Punch - find one with no sugar  Measure and decrease portions of carbohydrate foods  Make your plate and don't go back for seconds  Patient have been counseled extensively about nutrition and exercise  Return in about 3 months (around 07/31/2016) for Generalized Anxiety Disorder.  The patient was given clear instructions to go to ER or return to medical center if symptoms don't improve, worsen or new problems develop. The patient verbalized understanding. The patient was told to call to get lab results if they haven't heard anything in the next week.   This note has been created with Surveyor, quantity. Any transcriptional errors are unintentional.    Angelica Chessman, MD, [place], Karilyn Cota, [place] [facility] [place], [place]   05/01/2016, 3:57 PM

## 2016-05-19 ENCOUNTER — Ambulatory Visit: Payer: PRIVATE HEALTH INSURANCE | Attending: Internal Medicine

## 2016-06-03 ENCOUNTER — Other Ambulatory Visit: Payer: Self-pay | Admitting: Internal Medicine

## 2016-06-04 MED FILL — ESOMEPRAZOLE MAG DR 40 MG C: 40 | 30 days supply | Qty: 30 | Fill #0

## 2016-06-17 ENCOUNTER — Telehealth: Payer: Self-pay | Admitting: Internal Medicine

## 2016-06-17 DIAGNOSIS — F4322 Adjustment disorder with anxiety: Secondary | ICD-10-CM

## 2016-06-17 NOTE — Telephone Encounter (Signed)
Patient would like to know where she was referred to for therapy.... If no answer please leave detail message on voicemail

## 2016-06-17 NOTE — Telephone Encounter (Signed)
Patient has not been seen since September and no referrals were placed.

## 2016-06-18 NOTE — Telephone Encounter (Signed)
Patient verified DOB Patient is aware of referral being placed. MA will FU with the patient on Friday to share the status of the referral. MA will provide specialist contact information once it has been placed in the system by the referral coordinator. No further questions at this time.

## 2016-06-19 ENCOUNTER — Telehealth: Payer: Self-pay | Admitting: Internal Medicine

## 2016-06-19 NOTE — Telephone Encounter (Signed)
Pt requesting to speak to nurse in regards to a referral  Pt states she needs another referral Pt would not specify as to where and did not want to give any more information

## 2016-06-23 NOTE — Telephone Encounter (Signed)
Pt. Called requesting to speak with her nurse regarding a referral.  Asked pt. What kind of referral she needed and she stated that her nurse already New about it. Please f/u with pt.

## 2016-06-24 NOTE — Telephone Encounter (Signed)
Patient verified DOB Patient states [facility] contacted her and after reviewing her medications stated they do not prescribe XANAX and patient should call PCP to refer patient to a specialty that does. MA informed patient of referral being sent to [facility] because patient does not have insurance. Patient has [place] and [place]. MA informed patient of being self-pay if referral is sent to any other specialty outside of [facility]'s system. MA advised patient to call [facility] and schedule an appointment to review alternatives that may provide the same relief. Patient expressed her understanding and had no further questions at this time.

## 2016-06-25 ENCOUNTER — Encounter (HOSPITAL_COMMUNITY): Payer: Self-pay

## 2016-06-25 ENCOUNTER — Telehealth: Payer: Self-pay | Admitting: *Deleted

## 2016-06-25 NOTE — Telephone Encounter (Signed)
Patient verified DOB Patient contacted [facility] and was able to schedule an appointment at there earliest convenience and was given the appointment date of January 17th. Patient was scheduled by Maudie Mercury to see Dr. Modesta Messing. Patient was advised to contact our office and request approval for Xanax from PCP until appointment has been completed.  MA informed patient of request and information being sent to PCP for approval or advice. MA will contact patient with further information from PCP. No further questions at this time.

## 2016-06-27 ENCOUNTER — Telehealth: Payer: Self-pay | Admitting: Internal Medicine

## 2016-06-27 ENCOUNTER — Other Ambulatory Visit: Payer: Self-pay | Admitting: Internal Medicine

## 2016-06-27 DIAGNOSIS — F4322 Adjustment disorder with anxiety: Secondary | ICD-10-CM

## 2016-06-27 MED ORDER — ALPRAZOLAM 0.5 MG PO TABS: 0.5000 mg | ORAL_TABLET | Freq: Two times a day (BID) | ORAL | 0 refills | Status: DC | PRN

## 2016-06-27 NOTE — Telephone Encounter (Signed)
Patient called requesting refills on medication:   ALPRAZolam (XANAX) 0.5 MG tablet   Patient would like to speak to nurse. Please follow up

## 2016-06-27 NOTE — Telephone Encounter (Signed)
Done

## 2016-06-27 NOTE — Telephone Encounter (Signed)
Nurse spoke with patient yesterday and informed her of PCP providing approval. MA has not received a response just yet.

## 2016-06-30 ENCOUNTER — Telehealth: Payer: Self-pay | Admitting: Internal Medicine

## 2016-06-30 NOTE — Telephone Encounter (Signed)
Prescription is at the front desk for pick up.

## 2016-06-30 NOTE — Telephone Encounter (Signed)
Patient called to speak to [place] wants to know the status of Rx   ALPRAZolam Duanne Moron) 0.5 MG   Patient would like a phone call today. Please f/u with patient

## 2016-07-08 ENCOUNTER — Other Ambulatory Visit: Payer: Self-pay | Admitting: Internal Medicine

## 2016-07-08 DIAGNOSIS — M1990 Unspecified osteoarthritis, unspecified site: Secondary | ICD-10-CM

## 2016-07-08 MED FILL — NAPROXEN 500 MG TABLET: 500 | 30 days supply | Qty: 60 | Fill #0

## 2016-08-08 MED FILL — ESOMEPRAZOLE MAG DR 40 MG C: 40 | 30 days supply | Qty: 30 | Fill #1

## 2016-08-08 MED FILL — $VENTOLIN HFA 18G INHALER: 108 (90 BAS | 28 days supply | Qty: 18 | Fill #0

## 2016-08-15 ENCOUNTER — Ambulatory Visit: Payer: Self-pay | Attending: Internal Medicine

## 2016-08-22 ENCOUNTER — Ambulatory Visit: Payer: Self-pay

## 2016-08-25 NOTE — Progress Notes (Deleted)
Psychiatric Initial Adult Assessment   Patient Identification: Jacqueline Good MRN:  [id number] Date of Evaluation:  08/25/2016 Referral Source: *** Chief Complaint:   Visit Diagnosis: No diagnosis found.  History of Present Illness:   Jacqueline Good is a 56 year old female with history of depression, anxiety, morbid obesity, asthma, osteoarthritis who is referred for mental evaluation.    Associated Signs/Symptoms: Depression Symptoms:  {DEPRESSION SYMPTOMS:20000} (Hypo) Manic Symptoms:  {BHH MANIC SYMPTOMS:22872} Anxiety Symptoms:  {BHH ANXIETY SYMPTOMS:22873} Psychotic Symptoms:  {BHH PSYCHOTIC SYMPTOMS:22874} PTSD Symptoms: {BHH PTSD SYMPTOMS:22875}  Past Psychiatric History: * Outpatient:  Psychiatry admission:  Previous suicide attempt:  Past trials of medication:  History of violence:   Previous Psychotropic Medications: {YES/NO:21197}  Substance Abuse History in the last 12 months:  {yes no:314532}  Consequences of Substance Abuse: {BHH CONSEQUENCES OF SUBSTANCE ABUSE:22880}  Past Medical History:  Past Medical History:  Diagnosis Date  . Arthritis   . Asthma   . Depression     Past Surgical History:  Procedure Laterality Date  . MYOMECTOMY      Family Psychiatric History: ***  Family History:  Family History  Problem Relation Age of Onset  . Lupus Sister   . Heart disease Sister     Social History:   Social History   Social History  . Marital status: Single    Spouse name: N/A  . Number of children: N/A  . Years of education: N/A   Social History Main Topics  . Smoking status: Current Every Day Smoker    Packs/day: 0.50    Years: 20.00    Types: Cigarettes  . Smokeless tobacco: Never Used  . Alcohol use Yes     Comment: occasionally  . Drug use: No  . Sexual activity: Not on file   Other Topics Concern  . Not on file   Social History Narrative  . No narrative on file    Additional Social History: ***  Allergies:  No Known  Allergies  Metabolic Disorder Labs: Lab Results  Component Value Date   HGBA1C 5.4 04/27/2014   No results found for: PROLACTIN Lab Results  Component Value Date   CHOL 182 04/27/2014   TRIG 209 (H) 04/27/2014   HDL 54 04/27/2014   CHOLHDL 3.4 04/27/2014   VLDL 42 (H) 04/27/2014   LDLCALC 86 04/27/2014     Current Medications: Current Outpatient Prescriptions  Medication Sig Dispense Refill  . acetaminophen-codeine (TYLENOL #3) 300-30 MG tablet Take 1 tablet by mouth every 4 (four) hours as needed for moderate pain. 10 tablet 0  . albuterol (VENTOLIN HFA) 108 (90 Base) MCG/ACT inhaler INHALE 2 PUFFS INTO THE LUNGS EVERY 6 HOURS AS NEEDED FOR WHEEZING 54 g 3  . ALPRAZolam (XANAX) 0.5 MG tablet Take 1 tablet (0.5 mg total) by mouth 2 (two) times daily as needed for anxiety. 60 tablet 0  . esomeprazole (NEXIUM) 40 MG capsule TAKE ONE CAPSULE BY MOUTH DAILY 30 capsule 5  . fluticasone (FLONASE) 50 MCG/ACT nasal spray Place 2 sprays into both nostrils daily. 16 g 6  . Fluticasone-Salmeterol (ADVAIR DISKUS) 250-50 MCG/DOSE AEPB Inhale 1 puff into the lungs 2 (two) times daily. 3 each 3  . furosemide (LASIX) 20 MG tablet Take 1 tablet (20 mg total) by mouth daily. 30 tablet 3  . loratadine (CLARITIN) 10 MG tablet Take 1 tablet (10 mg total) by mouth daily. 90 tablet 3  . meclizine (ANTIVERT) 25 MG tablet Take 1 tablet (25 mg total)  by mouth 3 (three) times daily as needed for dizziness. 30 tablet 0  . naproxen (NAPROSYN) 500 MG tablet TAKE 1 TABLET BY MOUTH TWICE DAILY WITH A MEAL 60 tablet 0  . ondansetron (ZOFRAN-ODT) 8 MG disintegrating tablet Take 1 tablet (8 mg total) by mouth every 8 (eight) hours as needed for nausea or vomiting. 20 tablet 0  . traZODone (DESYREL) 50 MG tablet Take 0.5-1 tablets (25-50 mg total) by mouth at bedtime as needed for sleep. (Patient not taking: Reported on 05/01/2016) 30 tablet 3   No current facility-administered medications for this visit.      Neurologic: Headache: No Seizure: No Paresthesias:No  Musculoskeletal: Strength & Muscle Tone: within normal limits Gait & Station: normal Patient leans: N/A  Psychiatric Specialty Exam: ROS  There were no vitals taken for this visit.There is no height or weight on file to calculate BMI.  General Appearance: Fairly Groomed  Eye Contact:  Good  Speech:  Clear and Coherent  Volume:  Normal  Mood:  {BHH MOOD:22306}  Affect:  {Affect (PAA):22687}  Thought Process:  Coherent and Goal Directed  Orientation:  Full (Time, Place, and Person)  Thought Content:  Logical  Suicidal Thoughts:  {ST/HT (PAA):22692}  Homicidal Thoughts:  {ST/HT (PAA):22692}  Memory:  Immediate;   Good Recent;   Good Remote;   Good  Judgement:  {Judgement (PAA):22694}  Insight:  {Insight (PAA):22695}  Psychomotor Activity:  Normal  Concentration:  Concentration: Good and Attention Span: Good  Recall:  Good  Fund of Knowledge:Good  Language: Good  Akathisia:  No  Handed:  Right  AIMS (if indicated):  N/A  Assets:  Communication Skills Desire for Improvement  ADL's:  Intact  Cognition: WNL  Sleep:  ***   Assessment  Plan  The patient demonstrates the following risk factors for suicide: Chronic risk factors for suicide include: {Chronic Risk Factors for HD:3327074. Acute risk factors for suicide include: {Acute Risk Factors for NL:6244280. Protective factors for this patient include: {Protective Factors for Suicide FR:7288263. Considering these factors, the overall suicide risk at this point appears to be {Desc; low/moderate/high:110033}. Patient {ACTION; IS/IS GI:087931 appropriate for outpatient follow up.   Treatment Plan Summary: {CHL AMB [employer] MD [place] [id number]   Norman Clay, MD 1/15/201811:49 AM

## 2016-08-27 ENCOUNTER — Ambulatory Visit (HOSPITAL_COMMUNITY): Payer: Self-pay | Admitting: Psychiatry

## 2016-09-01 ENCOUNTER — Other Ambulatory Visit: Payer: Self-pay | Admitting: *Deleted

## 2016-09-01 DIAGNOSIS — F4322 Adjustment disorder with anxiety: Secondary | ICD-10-CM

## 2016-09-01 NOTE — Telephone Encounter (Signed)
Patient verified DOB Patient states her appointment with [facility] was this past Wednesday. Due to inclement weather the facility called to reschedule patients appointment. Patient was provided the first available which is 10/08/16. Patient was instructed to contact PCP regarding a refill to bridge the patient in between. MA informed patient of request and information being routed to PCP for approval or denial. Patient will be contact back with the PCP advice. No further questions at this time.

## 2016-09-04 NOTE — Telephone Encounter (Signed)
Message was routed on Monday to PCP. Awaiting the response.

## 2016-09-04 NOTE — Telephone Encounter (Signed)
Pt calling to check on Xanax refill  States she missed appointment with PCP due to snow

## 2016-09-09 NOTE — Telephone Encounter (Signed)
Patient is aware of OV being needed and was transferred to the front desk for scheduling.

## 2016-09-17 ENCOUNTER — Ambulatory Visit: Payer: Self-pay | Admitting: Internal Medicine

## 2016-09-22 ENCOUNTER — Encounter: Payer: Self-pay | Admitting: Obstetrics & Gynecology

## 2016-09-22 ENCOUNTER — Ambulatory Visit (INDEPENDENT_AMBULATORY_CARE_PROVIDER_SITE_OTHER): Payer: Self-pay | Admitting: Obstetrics & Gynecology

## 2016-09-22 VITALS — BP 121/72 | HR 72 | Ht 64.0 in | Wt 216.7 lb

## 2016-09-22 DIAGNOSIS — D250 Submucous leiomyoma of uterus: Secondary | ICD-10-CM

## 2016-09-22 DIAGNOSIS — R102 Pelvic and perineal pain: Secondary | ICD-10-CM

## 2016-09-22 NOTE — Progress Notes (Signed)
Subjective:     Jacqueline Good is a 56 y.o. female here for eval of pelvic pain.  LMP 56 yo prev. Pt is s/p a hysteroscopy with endometrial  ablation with Dr. Kalman Shan.  Pt is s/p a PAP 1 1/2 years prev. Pt had it at the free PAP screening that was at [facility]. Pt c/o pain that feels like what she had when she was dx'd with fibroids.  Pt reports pain every 3 months. The pain is not severe. "It just feels like the pain I had with the fibroid". Pt takes Naprosyn when the pain comes.  It usually relives the pain.  She denies weight loss or bloating or other related sx. She dneis bleeding or spotting for 11 years.   Gynecologic History No LMP recorded. Patient is postmenopausal. Contraception: post menopausal status Last Pap: 1 1/2 years prev. Results were: normal Last mammogram: 02/2016. Results were: normal  Obstetric History OB History  Gravida Para Term Preterm AB Living  2 1 1   1 1   SAB TAB Ectopic Multiple Live Births    1          # Outcome Date GA Lbr Len/2nd Weight Sex Delivery Anes PTL Lv  2 Term           1 TAB                The following portions of the patient's history were reviewed and updated as appropriate: allergies, current medications, past family history, past medical history, past social history, past surgical history and problem list.  Review of Systems Pertinent items are noted in HPI.    Objective:   BP 121/72   Pulse 72   Ht 5\' 4"  (1.626 m)   Wt 216 lb 11.2 oz (98.3 kg)   BMI 37.20 kg/m  General Appearance:    Alert, cooperative, no distress, appears stated age  Head:    Normocephalic, without obvious abnormality, atraumatic  Eyes:    conjunctiva/corneas clear, EOM's intact, both eyes  Ears:    Normal external ear canals, both ears  Nose:   Nares normal, septum midline, mucosa normal, no drainage    or sinus tenderness  Throat:   Lips, mucosa, and tongue normal; teeth and gums normal  Neck:   Supple, symmetrical, trachea midline, no adenopathy;    thyroid:   no enlargement/tenderness/nodules  Back:     Symmetric, no curvature, ROM normal, no CVA tenderness  Lungs:     Clear to auscultation bilaterally, respirations unlabored  Chest Wall:    No tenderness or deformity   Heart:    Regular rate and rhythm, S1 and S2 normal, no murmur, rub   or gallop     Abdomen:     Soft, non-tender, bowel sounds active all four quadrants,    no masses, no organomegaly  Genitalia:    Normal female without lesion, discharge or tenderness; uterus difficult to palpate due to body habitus.     Extremities:   Extremities normal, atraumatic, no cyanosis or edema  Pulses:   2+ and symmetric all extremities  Skin:   Skin color, texture, turgor normal, no rashes or lesions     Assessment:    Healthy female exam.   Pelvic pain h/o uterine fibroids   Plan:  Pelvic US to eval. Cont NSAIDs prn F/u in 1 year or sooner prn results of [place]   Nivia Gervase L. Harraway-Smith, M.D., Cherlynn June

## 2016-09-22 NOTE — Progress Notes (Signed)
Pt has a history of fibroids. She had an ablation about 12 years ago. Reports having the same type of pain she had prior to the ablation.

## 2016-09-22 NOTE — Patient Instructions (Signed)
Pelvic Pain, Female Female pelvic pain can be caused by many different things and start from a variety of places. Pelvic pain refers to pain that is located in the lower half of the abdomen and between your hips. The pain may occur over a short period of time (acute) or may be reoccurring (chronic). The cause of pelvic pain may be related to disorders affecting the female reproductive organs (gynecologic), but it may also be related to the bladder, kidney stones, an intestinal complication, or muscle or skeletal problems. Getting help right away for pelvic pain is important, especially if there has been severe, sharp, or a sudden onset of unusual pain. It is also important to get help right away because some types of pelvic pain can be life threatening.  CAUSES  Below are only some of the causes of pelvic pain. The causes of pelvic pain can be in one of several categories.   Gynecologic.  Pelvic inflammatory disease.  Sexually transmitted infection.  Ovarian cyst or a twisted ovarian ligament (ovarian torsion).  Uterine lining that grows outside the uterus (endometriosis).  Fibroids, cysts, or tumors.  Ovulation.  Pregnancy.  Pregnancy that occurs outside the uterus (ectopic pregnancy).  Miscarriage.  Labor.  Abruption of the placenta or ruptured uterus.  Infection.  Uterine infection (endometritis).  Bladder infection.  Diverticulitis.  Miscarriage related to a uterine infection (septic abortion).  Bladder.  Inflammation of the bladder (cystitis).  Kidney stone(s).  Gastrointestinal.  Constipation.  Diverticulitis.  Neurologic.  Trauma.  Feeling pelvic pain because of mental or emotional causes (psychosomatic).  Cancers of the bowel or pelvis. EVALUATION  Your caregiver will want to take a careful history of your concerns. This includes recent changes in your health, a careful gynecologic history of your periods (menses), and a sexual history. Obtaining  your family history and medical history is also important. Your caregiver may suggest a pelvic exam. A pelvic exam will help identify the location and severity of the pain. It also helps in the evaluation of which organ system may be involved. In order to identify the cause of the pelvic pain and be properly treated, your caregiver may order tests. These tests may include:   A pregnancy test.  Pelvic ultrasonography.  An X-ray exam of the abdomen.  A urinalysis or evaluation of vaginal discharge.  Blood tests. HOME CARE INSTRUCTIONS   Only take over-the-counter or prescription medicines for pain, discomfort, or fever as directed by your caregiver.   Rest as directed by your caregiver.   Eat a balanced diet.   Drink enough fluids to make your urine clear or pale yellow, or as directed.   Avoid sexual intercourse if it causes pain.   Apply warm or cold compresses to the lower abdomen depending on which one helps the pain.   Avoid stressful situations.   Keep a journal of your pelvic pain. Write down when it started, where the pain is located, and if there are things that seem to be associated with the pain, such as food or your menstrual cycle.  Follow up with your caregiver as directed.  SEEK MEDICAL CARE IF:  Your medicine does not help your pain.  You have abnormal vaginal discharge. SEEK IMMEDIATE MEDICAL CARE IF:   You have heavy bleeding from the vagina.   Your pelvic pain increases.   You feel light-headed or faint.   You have chills.   You have pain with urination or blood in your urine.   You have uncontrolled  diarrhea or vomiting.   You have a fever or persistent symptoms for more than 3 days.  You have a fever and your symptoms suddenly get worse.   You are being physically or sexually abused. This information is not intended to replace advice given to you by your health care provider. Make sure you discuss any questions you have with your  health care provider. Document Released: 06/24/2004 Document Revised: 04/18/2015 Document Reviewed: 05/18/2015 Elsevier Interactive Patient Education  2017 [employer].

## 2016-09-23 ENCOUNTER — Telehealth: Payer: Self-pay | Admitting: *Deleted

## 2016-09-23 NOTE — Telephone Encounter (Signed)
Pt left message stating that she is calling to speak with Estill Bamberg. She would like information about the Cervical Cancer Screening program so she can schedule appt to get a Pap test.

## 2016-09-23 NOTE — Telephone Encounter (Signed)
Rolena Infante to contact patient for her pap screening.

## 2016-09-24 ENCOUNTER — Encounter: Payer: Self-pay | Admitting: Internal Medicine

## 2016-09-24 ENCOUNTER — Ambulatory Visit: Payer: Self-pay | Attending: Internal Medicine | Admitting: Internal Medicine

## 2016-09-24 ENCOUNTER — Other Ambulatory Visit: Payer: Self-pay | Admitting: Internal Medicine

## 2016-09-24 DIAGNOSIS — J45909 Unspecified asthma, uncomplicated: Secondary | ICD-10-CM | POA: Insufficient documentation

## 2016-09-24 DIAGNOSIS — F329 Major depressive disorder, single episode, unspecified: Secondary | ICD-10-CM | POA: Insufficient documentation

## 2016-09-24 DIAGNOSIS — K2100 Gastro-esophageal reflux disease with esophagitis, without bleeding: Secondary | ICD-10-CM

## 2016-09-24 DIAGNOSIS — F4322 Adjustment disorder with anxiety: Secondary | ICD-10-CM | POA: Insufficient documentation

## 2016-09-24 DIAGNOSIS — M1990 Unspecified osteoarthritis, unspecified site: Secondary | ICD-10-CM | POA: Insufficient documentation

## 2016-09-24 DIAGNOSIS — Z23 Encounter for immunization: Secondary | ICD-10-CM

## 2016-09-24 DIAGNOSIS — E789 Disorder of lipoprotein metabolism, unspecified: Secondary | ICD-10-CM

## 2016-09-24 LAB — CBC WITH DIFFERENTIAL/PLATELET
BASOS ABS: 0 {cells}/uL (ref 0–200)
BASOS PCT: 0 %
EOS PCT: 2 %
Eosinophils Absolute: 192 cells/uL (ref 15–500)
HCT: 43.9 % (ref 35.0–45.0)
Hemoglobin: 14.2 g/dL (ref 11.7–15.5)
Lymphocytes Relative: 22 %
Lymphs Abs: 2112 cells/uL (ref 850–3900)
MCH: 29.7 pg (ref 27.0–33.0)
MCHC: 32.3 g/dL (ref 32.0–36.0)
MCV: 91.8 fL (ref 80.0–100.0)
MONOS PCT: 4 %
MPV: 10.3 fL (ref 7.5–12.5)
Monocytes Absolute: 384 cells/uL (ref 200–950)
NEUTROS ABS: 6912 {cells}/uL (ref 1500–7800)
Neutrophils Relative %: 72 %
PLATELETS: 306 10*3/uL (ref 140–400)
RBC: 4.78 MIL/uL (ref 3.80–5.10)
RDW: 13.6 % (ref 11.0–15.0)
WBC: 9.6 10*3/uL (ref 3.8–10.8)

## 2016-09-24 LAB — LIPID PANEL
Cholesterol: 159 mg/dL (ref <200)
HDL: 47 mg/dL — ABNORMAL LOW (ref >50)
LDL CALC: 76 mg/dL (ref <100)
TRIGLYCERIDES: 179 mg/dL — AB (ref <150)
Total CHOL/HDL Ratio: 3.4 Ratio (ref <5.0)
VLDL: 36 mg/dL — AB (ref <30)

## 2016-09-24 LAB — COMPLETE METABOLIC PANEL WITH GFR
ALT: 30 U/L — AB (ref 6–29)
AST: 24 U/L (ref 10–35)
Albumin: 4.5 g/dL (ref 3.6–5.1)
Alkaline Phosphatase: 89 U/L (ref 33–130)
BILIRUBIN TOTAL: 0.8 mg/dL (ref 0.2–1.2)
BUN: 13 mg/dL (ref 7–25)
CHLORIDE: 107 mmol/L (ref 98–110)
CO2: 25 mmol/L (ref 20–31)
CREATININE: 0.83 mg/dL (ref 0.50–1.05)
Calcium: 9.5 mg/dL (ref 8.6–10.4)
GFR, Est Non African American: 79 mL/min (ref >=60)
GLUCOSE: 90 mg/dL (ref 65–99)
Potassium: 4.3 mmol/L (ref 3.5–5.3)
Sodium: 142 mmol/L (ref 135–146)
TOTAL PROTEIN: 7.1 g/dL (ref 6.1–8.1)

## 2016-09-24 LAB — TSH: TSH: 0.58 m[IU]/L

## 2016-09-24 MED ORDER — ESOMEPRAZOLE MAGNESIUM 40 MG PO CPDR: 40.0000 mg | DELAYED_RELEASE_CAPSULE | Freq: Every day | ORAL | 5 refills | Status: DC

## 2016-09-24 MED ORDER — ALPRAZOLAM 0.5 MG PO TABS: 0.5000 mg | ORAL_TABLET | Freq: Two times a day (BID) | ORAL | 0 refills | Status: DC | PRN

## 2016-09-24 MED ORDER — TRAZODONE HCL 50 MG PO TABS: 25.0000 mg | ORAL_TABLET | Freq: Every evening | ORAL | 3 refills | Status: DC | PRN

## 2016-09-24 MED FILL — ?ESOMEPRAZOLE MAG DR 40 MG: 40 MG | 30 days supply | Qty: 30 | Fill #0

## 2016-09-24 MED FILL — traZODone HCL 50 MG TABS: 50 | 30 days supply | Qty: 30 | Fill #0

## 2016-09-24 NOTE — Progress Notes (Signed)
Patient is here to speak about Xanax refill  Patient has psychiatric appointment on 10/08/16.  Patient denies pain at this time.

## 2016-09-24 NOTE — Patient Instructions (Signed)
Gastroesophageal Reflux Scan A gastroesophageal reflux scan is a procedure that is used to check for gastroesophageal reflux, which is the backward flow of stomach contents into the tube that carries food from the mouth to the stomach (esophagus). The scan can also show if any stomach contents are inhaled (aspirated) into your lungs. You may need this scan if you have symptoms such as heartburn, vomiting, swallowing problems, or regurgitation. Regurgitation means that swallowed food is returning from the stomach to the esophagus. For this scan, you will drink a liquid that contains a small amount of a radioactive substance (tracer). A scanner with a camera that detects the radioactive tracer is used to see if any of the material backs up into your esophagus. Tell a health care provider about:  Any allergies you have.  All medicines you are taking, including vitamins, herbs, eye drops, creams, and over-the-counter medicines.  Any blood disorders you have.  Any surgeries you have had.  Any medical conditions you have.  If you are pregnant or you think that you may be pregnant.  If you are breastfeeding. What are the risks? Generally, this is a safe procedure. However, problems may occur, including:  Exposure to radiation (a small amount).  Allergic reaction to the radioactive substance. This is rare. What happens before the procedure?  Ask your health care provider about changing or stopping your regular medicines. This is especially important if you are taking diabetes medicines or blood thinners.  Follow your health care provider's instructions about eating or drinking restrictions. What happens during the procedure?  You will be asked to drink a liquid that contains a small amount of a radioactive tracer. This liquid will probably be similar to orange juice.  You will assume a position lying on your back.  A series of images will be taken of your esophagus and upper  stomach.  You may be asked to move into different positions to help determine if reflux occurs more often when you are in specific positions.  For adults, an abdominal binder with an inflatable cuff may be placed on the belly (abdomen). This may be used to increase abdominal pressure. More images will be taken to see if the increased pressure causes reflux to occur. The procedure may vary among health care providers and hospitals. What happens after the procedure?  Return to your normal activities and your normal diet as directed by your health care provider.  The radioactive tracer will leave your body over the next few days. Drink enough fluid to keep your urine clear or pale yellow. This will help to flush the tracer out of your body.  It is your responsibility to obtain your test results. Ask your health care provider or the department performing the test when and how you will get your results. This information is not intended to replace advice given to you by your health care provider. Make sure you discuss any questions you have with your health care provider. Document Released: 09/18/2005 Document Revised: 04/21/2016 Document Reviewed: 05/09/2014 Elsevier Interactive Patient Education  2017 Elsevier Inc.  

## 2016-09-24 NOTE — Progress Notes (Signed)
Jacqueline Good, is a 56 y.o. female  [id number]  [id number]  DOB - 1960/10/16  Chief Complaint  Patient presents with  . Medication Refill      Subjective:   Jacqueline Good is a 56 y.o. female with history of asthma, osteoarthritis, seasonal allergy and major depression heretodayfor a follow up visitand medication refills. Patient reports ongoing significant anxiety, panic attacks and depression as a result of her persistent social and financial conditions. As a result of this she is not able to sleep well at night. She is gaining so much weight as well as some leg edema, she has been on furosemide which helps. She reports no fall or injury, no joint swelling or redness. She denies any pain. She denies suicidal ideations or thoughts. She has appointment with psychiatrist coming up but ran out of xanax and now she feels miserable. She was tearful during this encounter but declined to see our LCSW for counseling. She has no chest pain, no cough or SOB. No abdominal pain, no N/V/D. No urinary symptom.  ALLERGIES: No Known Allergies  PAST MEDICAL HISTORY: Past Medical History:  Diagnosis Date  . Arthritis   . Asthma   . Depression    MEDICATIONS AT HOME: Prior to Admission medications   Medication Sig Start Date End Date Taking? Authorizing Provider  acetaminophen-codeine (TYLENOL #3) 300-30 MG tablet Take 1 tablet by mouth every 4 (four) hours as needed for moderate pain. 12/05/15   Argentina Donovan, PA-C  albuterol (VENTOLIN HFA) 108 (90 Base) MCG/ACT inhaler INHALE 2 PUFFS INTO THE LUNGS EVERY 6 HOURS AS NEEDED FOR WHEEZING 04/18/16   Tresa Garter, MD  ALPRAZolam (XANAX) 0.5 MG tablet Take 1 tablet (0.5 mg total) by mouth 2 (two) times daily as needed for anxiety. 09/24/16   Tresa Garter, MD  esomeprazole (NEXIUM) 40 MG capsule Take 1 capsule (40 mg total) by mouth daily. 09/24/16   Tresa Garter, MD  fluticasone (FLONASE) 50 MCG/ACT nasal spray Place 2  sprays into both nostrils daily. 03/27/16   Tresa Garter, MD  Fluticasone-Salmeterol (ADVAIR DISKUS) 250-50 MCG/DOSE AEPB Inhale 1 puff into the lungs 2 (two) times daily. 12/01/14   Tresa Garter, MD  traZODone (DESYREL) 50 MG tablet Take 0.5-1 tablets (25-50 mg total) by mouth at bedtime as needed for sleep. 09/24/16   Tresa Garter, MD    Objective:   Vitals:   09/24/16 1109  BP: 121/80  Pulse: 76  Resp: 18  Temp: 98.1 F (36.7 C)  TempSrc: Oral  SpO2: 96%  Weight: 222 lb 12.8 oz (101.1 kg)  Height: 5\' 2"  (1.575 m)   Exam General appearance : Awake, alert, not in any distress. Speech Clear. Not toxic looking, morbidly obese HEENT: Atraumatic and Normocephalic, pupils equally reactive to light and accomodation Neck: Supple, no JVD. No cervical lymphadenopathy.  Chest: Good air entry bilaterally, no added sounds  CVS: S1 S2 regular, no murmurs.  Abdomen: Bowel sounds present, Non tender and not distended with no gaurding, rigidity or rebound. Extremities: B/L Lower Ext shows no edema, both legs are warm to touch Neurology: Awake alert, and oriented X 3, CN II-XII intact, Non focal Skin: No Rash  Data Review Lab Results  Component Value Date   HGBA1C 5.4 04/27/2014    Assessment & Plan   1. Morbid obesity due to excess calories (HCC)  - CBC with Differential/Platelet - COMPLETE METABOLIC PANEL WITH GFR - TSH - Urinalysis, Complete - POCT glycosylated  hemoglobin (Hb A1C)  2. Gastroesophageal reflux disease with esophagitis  - Omeprazole  3. Abnormal cholesterol test  - Lipid panel  4. Adjustment disorder with anxious mood Refill - ALPRAZolam (XANAX) 0.5 MG tablet; Take 1 tablet (0.5 mg total) by mouth 2 (two) times daily as needed for anxiety.  Dispense: 60 tablet; Refill: 0 - traZODone (DESYREL) 50 MG tablet; Take 0.5-1 tablets (25-50 mg total) by mouth at bedtime as needed for sleep.  Dispense: 30 tablet; Refill: 3  Patient have been  counseled extensively about nutrition and exercise. Other issues discussed during this visit include: low cholesterol diet, weight control and daily exercise, importance of adherence with medications and regular follow-up.   Return in about 6 months (around 03/24/2017) for Generalized Anxiety Disorder, Routine Follow Up.  The patient was given clear instructions to go to ER or return to medical center if symptoms don't improve, worsen or new problems develop. The patient verbalized understanding. The patient was told to call to get lab results if they haven't heard anything in the next week.   This note has been created with Surveyor, quantity. Any transcriptional errors are unintentional.    Angelica Chessman, MD, [place], [place]arilyn [place], [place] and [place], [place]   09/24/2016, 11:48 AM

## 2016-09-25 LAB — URINALYSIS, COMPLETE: YEAST: NONE SEEN [HPF]

## 2016-09-29 ENCOUNTER — Ambulatory Visit (HOSPITAL_COMMUNITY): Payer: Self-pay

## 2016-09-30 ENCOUNTER — Telehealth: Payer: Self-pay | Admitting: *Deleted

## 2016-09-30 NOTE — Telephone Encounter (Signed)
-----   Message from Tresa Garter, MD sent at 09/25/2016 11:07 AM EST ----- Please inform patient that her lab results are mostly normal.

## 2016-09-30 NOTE — Telephone Encounter (Signed)
Patient verified DOB Patient is aware of labs being mostly normal. Patient expressed her understanding and had no further questions at this time.

## 2016-10-01 ENCOUNTER — Ambulatory Visit (HOSPITAL_COMMUNITY)
Admission: RE | Admit: 2016-10-01 | Discharge: 2016-10-01 | Disposition: A | Discharge status: Home / Self Care | Payer: Self-pay | Source: Ambulatory Visit | Attending: Obstetrics & Gynecology | Admitting: Obstetrics & Gynecology

## 2016-10-01 DIAGNOSIS — N7011 Chronic salpingitis: Secondary | ICD-10-CM | POA: Insufficient documentation

## 2016-10-01 DIAGNOSIS — D250 Submucous leiomyoma of uterus: Secondary | ICD-10-CM

## 2016-10-01 DIAGNOSIS — G8929 Other chronic pain: Secondary | ICD-10-CM | POA: Insufficient documentation

## 2016-10-01 DIAGNOSIS — D252 Subserosal leiomyoma of uterus: Secondary | ICD-10-CM | POA: Insufficient documentation

## 2016-10-01 DIAGNOSIS — R102 Pelvic and perineal pain: Secondary | ICD-10-CM | POA: Insufficient documentation

## 2016-10-08 ENCOUNTER — Telehealth: Payer: Self-pay | Admitting: *Deleted

## 2016-10-08 ENCOUNTER — Ambulatory Visit (HOSPITAL_COMMUNITY): Payer: Self-pay | Admitting: Psychiatry

## 2016-10-08 NOTE — Telephone Encounter (Addendum)
Pt left message requesting test results.   3/5  1635  Called pt and left message stating that I am returning her call regarding request for test result information. Please call back and state what results she is wanting and whether a message can be left on her voice mail.

## 2016-10-13 ENCOUNTER — Ambulatory Visit (HOSPITAL_COMMUNITY): Payer: Self-pay | Admitting: Psychiatry

## 2016-10-14 ENCOUNTER — Other Ambulatory Visit: Payer: Self-pay | Admitting: Internal Medicine

## 2016-10-14 DIAGNOSIS — M1990 Unspecified osteoarthritis, unspecified site: Secondary | ICD-10-CM

## 2016-10-15 MED FILL — busPIRone HCL 15 MG TABS: 15 | 30 days supply | Qty: 60 | Fill #0

## 2016-11-04 ENCOUNTER — Telehealth: Payer: Self-pay | Admitting: Internal Medicine

## 2016-11-04 ENCOUNTER — Other Ambulatory Visit: Payer: Self-pay | Admitting: Internal Medicine

## 2016-11-04 DIAGNOSIS — M1990 Unspecified osteoarthritis, unspecified site: Secondary | ICD-10-CM

## 2016-11-04 NOTE — Telephone Encounter (Signed)
Naproxen not on medication list. Will forward request to Dr. Doreene Burke,

## 2016-11-04 NOTE — Telephone Encounter (Signed)
Patient called the office to request medication refill for naproxen (NAPROSYN) 500 MG tablet. Please send it to our pharmacy.  Thank you.

## 2016-11-06 ENCOUNTER — Other Ambulatory Visit: Payer: Self-pay | Admitting: Internal Medicine

## 2016-11-06 MED ORDER — NAPROXEN 500 MG PO TABS: 500.0000 mg | ORAL_TABLET | Freq: Two times a day (BID) | ORAL | 0 refills | Status: DC

## 2016-11-06 NOTE — Telephone Encounter (Signed)
Prescription sent to pharmacy.

## 2016-11-10 MED FILL — NAPROXEN 500 MG TABLET: 500 | 30 days supply | Qty: 60 | Fill #0

## 2016-11-14 ENCOUNTER — Other Ambulatory Visit: Payer: Self-pay | Admitting: Internal Medicine

## 2016-11-14 DIAGNOSIS — M1990 Unspecified osteoarthritis, unspecified site: Secondary | ICD-10-CM

## 2016-11-14 MED FILL — ?BUSPIRONE HCL 30 MG TABLET: 30 | 30 days supply | Qty: 60 | Fill #0

## 2016-11-19 MED FILL — ?ESOMEPRAZOLE MAG DR 40 MG: 40 MG | 30 days supply | Qty: 30 | Fill #1

## 2016-11-19 MED FILL — $VENTOLIN HFA 18G INHALER: 108 (90 BAS | 28 days supply | Qty: 18 | Fill #1

## 2016-12-17 MED FILL — busPIRone HCL 15 MG TABS: 15 | 30 days supply | Qty: 60 | Fill #1

## 2017-01-16 MED FILL — busPIRone HCL 30 MG TABS: 30 | 30 days supply | Qty: 60 | Fill #1

## 2017-01-21 MED FILL — ?DULOXETINE HCL DR 30 MG CA: 30 MG | 30 days supply | Qty: 60 | Fill #0

## 2017-02-23 MED FILL — busPIRone HCL 30 MG TABS: 30 | 30 days supply | Qty: 60 | Fill #0

## 2017-02-23 MED FILL — DULoxetine HCL 60 MG CPEP: 60 | 30 days supply | Qty: 30 | Fill #0

## 2017-03-06 ENCOUNTER — Ambulatory Visit: Payer: Medicaid Other | Attending: Internal Medicine

## 2017-04-02 MED FILL — ?DULOXETINE HCL DR 30 MG CA: 30 MG | 30 days supply | Qty: 90 | Fill #0

## 2017-04-02 MED FILL — $VENTOLIN HFA 18G INHALER: 108 (90 BAS | 28 days supply | Qty: 18 | Fill #2

## 2017-04-02 MED FILL — busPIRone HCL 30 MG TABS: 30 | 15 days supply | Qty: 30 | Fill #0

## 2017-04-30 MED FILL — busPIRone HCL 30 MG TABS: 30 | 15 days supply | Qty: 30 | Fill #1

## 2017-05-04 MED FILL — ?DULOXETINE HCL DR 30 MG CA: 30 MG | 30 days supply | Qty: 90 | Fill #0

## 2017-05-05 ENCOUNTER — Other Ambulatory Visit: Payer: Self-pay | Admitting: Internal Medicine

## 2017-05-25 MED FILL — busPIRone HCL 7.5 MG TABS: 7.5 | 15 days supply | Qty: 120 | Fill #0

## 2017-06-03 ENCOUNTER — Ambulatory Visit: Payer: Self-pay | Attending: Internal Medicine | Admitting: Internal Medicine

## 2017-06-03 VITALS — BP 119/79 | HR 79 | Temp 99.5°F | Resp 18 | Ht 64.0 in | Wt 210.0 lb

## 2017-06-03 DIAGNOSIS — F419 Anxiety disorder, unspecified: Secondary | ICD-10-CM | POA: Insufficient documentation

## 2017-06-03 DIAGNOSIS — Z23 Encounter for immunization: Secondary | ICD-10-CM | POA: Insufficient documentation

## 2017-06-03 DIAGNOSIS — M1990 Unspecified osteoarthritis, unspecified site: Secondary | ICD-10-CM | POA: Insufficient documentation

## 2017-06-03 DIAGNOSIS — G8929 Other chronic pain: Secondary | ICD-10-CM | POA: Insufficient documentation

## 2017-06-03 DIAGNOSIS — M545 Low back pain: Secondary | ICD-10-CM | POA: Insufficient documentation

## 2017-06-03 DIAGNOSIS — Z79899 Other long term (current) drug therapy: Secondary | ICD-10-CM | POA: Insufficient documentation

## 2017-06-03 DIAGNOSIS — F329 Major depressive disorder, single episode, unspecified: Secondary | ICD-10-CM | POA: Insufficient documentation

## 2017-06-03 DIAGNOSIS — Z760 Encounter for issue of repeat prescription: Secondary | ICD-10-CM | POA: Insufficient documentation

## 2017-06-03 DIAGNOSIS — J45909 Unspecified asthma, uncomplicated: Secondary | ICD-10-CM | POA: Insufficient documentation

## 2017-06-03 MED ORDER — NAPROXEN 500 MG PO TABS: 500.0000 mg | ORAL_TABLET | Freq: Two times a day (BID) | ORAL | 3 refills | Status: DC

## 2017-06-03 MED FILL — NAPROXEN 500 MG TABLET: 500 | 30 days supply | Qty: 60 | Fill #0

## 2017-06-03 MED FILL — ?DULOXETINE HCL DR 30 MG CA: 30 MG | 30 days supply | Qty: 90 | Fill #0

## 2017-06-03 NOTE — Progress Notes (Signed)
Jacqueline Good, is a 56 y.o. female  [id number]:[id number]  [id number]  DOB - Aug 30, 1960  Chief Complaint  Patient presents with  . Medication Refill      Subjective:   Jacqueline Good is a 56 y.o. female with history of asthma, osteoarthritis, seasonal allergy and major depression here today for a follow up visit. Patient reports significant improvement in her anxiety and depression but continues to have low back pain. No urinary or fecal incontinence, no tingling or numbness. Patient reports no fall or injury, no joint swelling or redness. She denies suicidal ideations or thoughts. She request pain medications. She needs flu vaccination today. Patient has No headache, No chest pain, No abdominal pain - No Nausea, No new weakness tingling or numbness, No Cough - SOB.  Problem  Needs Flu Shot  Chronic Bilateral Low Back Pain Without Sciatica    ALLERGIES: No Known Allergies  PAST MEDICAL HISTORY: Past Medical History:  Diagnosis Date  . Arthritis   . Asthma   . Depression     MEDICATIONS AT HOME: Prior to Admission medications   Medication Sig Start Date End Date Taking? Authorizing Provider  albuterol (VENTOLIN HFA) 108 (90 Base) MCG/ACT inhaler INHALE 2 PUFFS INTO THE LUNGS EVERY 6 HOURS AS NEEDED FOR WHEEZING 04/18/16  Yes Teri Diltz, Marlena Clipper, MD  ALPRAZolam (XANAX) 0.5 MG tablet Take 1 tablet (0.5 mg total) by mouth 2 (two) times daily as needed for anxiety. 09/24/16  Yes Tresa Garter, MD  esomeprazole (NEXIUM) 40 MG capsule Take 1 capsule (40 mg total) by mouth daily. 09/24/16  Yes Tresa Garter, MD  fluticasone (FLONASE) 50 MCG/ACT nasal spray Place 2 sprays into both nostrils daily. 03/27/16  Yes Mariafernanda Hendricksen, Marlena Clipper, MD  Fluticasone-Salmeterol (ADVAIR DISKUS) 250-50 MCG/DOSE AEPB Inhale 1 puff into the lungs 2 (two) times daily. 12/01/14  Yes Tresa Garter, MD  naproxen (NAPROSYN) 500 MG tablet Take 1 tablet (500 mg total) by mouth 2 (two) times daily with  a meal. 06/03/17  Yes Deem Marmol E, MD  traZODone (DESYREL) 50 MG tablet Take 0.5-1 tablets (25-50 mg total) by mouth at bedtime as needed for sleep. 09/24/16  Yes Tresa Garter, MD    Objective:   Vitals:   06/03/17 1644  BP: 119/79  Pulse: 79  Resp: 18  Temp: 99.5 F (37.5 C)  TempSrc: Oral  SpO2: 99%  Weight: 210 lb (95.3 kg)  Height: 5\' 4"  (1.626 m)   Exam General appearance : Awake, alert, not in any distress. Speech Clear. Not toxic looking HEENT: Atraumatic and Normocephalic, pupils equally reactive to light and accomodation Neck: Supple, no JVD. No cervical lymphadenopathy.  Chest: Good air entry bilaterally, no added sounds  CVS: S1 S2 regular, no murmurs.  Abdomen: Bowel sounds present, Non tender and not distended with no gaurding, rigidity or rebound. Extremities: B/L Lower Ext shows no edema, both legs are warm to touch Neurology: Awake alert, and oriented X 3, CN II-XII intact, Non focal Skin: No Rash  Data Review Lab Results  Component Value Date   HGBA1C 5.4 04/27/2014    Assessment & Plan   1. Chronic bilateral low back pain without sciatica  - naproxen (NAPROSYN) 500 MG tablet; Take 1 tablet (500 mg total) by mouth 2 (two) times daily with a meal.  Dispense: 60 tablet; Refill: 3  2. Needs flu shot - Flu vaccine given today  Patient have been counseled extensively about nutrition and exercise. Other issues discussed during this  visit include: low cholesterol diet, weight control and daily exercise.  Return in about 6 months (around 12/02/2017) for Follow up Pain and comorbidities.  The patient was given clear instructions to go to ER or return to medical center if symptoms don't improve, worsen or new problems develop. The patient verbalized understanding. The patient was told to call to get lab results if they haven't heard anything in the next week.   This note has been created with Automotive engineer. Any transcriptional errors are unintentional.    Angelica Chessman, MD, [place], [place], [place], [place] and [place], [place]   06/03/2017, 5:04 PM

## 2017-06-03 NOTE — Patient Instructions (Signed)

## 2017-06-22 MED FILL — busPIRone HCL 30 MG TABS: 30 | 30 days supply | Qty: 60 | Fill #0

## 2017-06-24 ENCOUNTER — Ambulatory Visit: Payer: Self-pay | Attending: Internal Medicine

## 2017-07-05 ENCOUNTER — Other Ambulatory Visit: Payer: Self-pay

## 2017-07-05 ENCOUNTER — Encounter (HOSPITAL_COMMUNITY): Payer: Self-pay | Admitting: *Deleted

## 2017-07-05 ENCOUNTER — Ambulatory Visit (HOSPITAL_COMMUNITY)
Admission: EM | Admit: 2017-07-05 | Discharge: 2017-07-05 | Disposition: A | Discharge status: Home / Self Care | Payer: Medicaid Other | Attending: Family Medicine | Admitting: Family Medicine

## 2017-07-05 DIAGNOSIS — R05 Cough: Secondary | ICD-10-CM

## 2017-07-05 DIAGNOSIS — J40 Bronchitis, not specified as acute or chronic: Secondary | ICD-10-CM

## 2017-07-05 MED ORDER — CETIRIZINE-PSEUDOEPHEDRINE ER 5-120 MG PO TB12: 1.0000 | ORAL_TABLET | Freq: Every day | ORAL | 0 refills | Status: DC

## 2017-07-05 MED ORDER — PREDNISONE 20 MG PO TABS: 40.0000 mg | ORAL_TABLET | Freq: Every day | ORAL | 0 refills | Status: AC

## 2017-07-05 MED ORDER — AZITHROMYCIN 250 MG PO TABS: 250.0000 mg | ORAL_TABLET | Freq: Every day | ORAL | 0 refills | Status: DC

## 2017-07-05 MED ORDER — FLUTICASONE PROPIONATE 50 MCG/ACT NA SUSP: 2.0000 | Freq: Every day | NASAL | 0 refills | Status: DC

## 2017-07-05 NOTE — ED Provider Notes (Signed)
[facility]    CSN: [id number] Arrival date & time: 07/05/17  1203     History   Chief Complaint Chief Complaint  Patient presents with  . Cough    HPI Jacqueline Good is a 56 y.o. female.   56 year old female with history of arthritis, asthma, depression comes in for 3 day history of URI symptoms. She is experiencing cough, congestion, chest soreness with coughing, bilateral ear pain. Denies fever, chills, night sweat. Productive cough that is slightly worse at night. Has had increased use of albuterol with relief. Has not tried anything else for the symptoms. No sick contact. Current everyday smoker, 0.5ppd, 10 pack year history. She states occasionally she may not smoke every day, but then will make up for it if she smokes again. Has not been able to smoke these past few days.       Past Medical History:  Diagnosis Date  . Arthritis   . Asthma   . Depression     Patient Active Problem List   Diagnosis Date Noted  . Needs flu shot 06/03/2017  . Chronic bilateral low back pain without sciatica 06/03/2017  . Adjustment disorder with anxious mood 03/27/2016  . Benign paroxysmal positional vertigo 10/31/2014  . Arthritis 04/27/2014  . Fatigue 06/10/2013  . Morbid obesity ([place]) 06/10/2013  . Asthma, chronic obstructive, with acute exacerbation ([place]) 06/10/2013  . Allergy 06/10/2013    Past Surgical History:  Procedure Laterality Date  . endometrial ablation    . MYOMECTOMY      OB History    Gravida Para Term Preterm AB Living   2 1 1   1 1    SAB TAB Ectopic Multiple Live Births     1             Home Medications    Prior to Admission medications   Medication Sig Start Date End Date Taking? Authorizing Provider  albuterol (VENTOLIN HFA) 108 (90 Base) MCG/ACT inhaler INHALE 2 PUFFS INTO THE LUNGS EVERY 6 HOURS AS NEEDED FOR WHEEZING 04/18/16  Yes Jegede, Marlena Clipper, MD  ALPRAZolam (XANAX) 0.5 MG tablet Take 1 tablet (0.5 mg total) by mouth 2  (two) times daily as needed for anxiety. 09/24/16  Yes Jegede, Marlena Clipper, MD  BusPIRone HCl (BUSPAR PO) Take by mouth.   Yes [provider]  DULoxetine HCl (CYMBALTA PO) Take by mouth.   Yes [provider]  esomeprazole (NEXIUM) 40 MG capsule Take 1 capsule (40 mg total) by mouth daily. 09/24/16  Yes Jegede, Marlena Clipper, MD  Fluticasone-Salmeterol (ADVAIR DISKUS) 250-50 MCG/DOSE AEPB Inhale 1 puff into the lungs 2 (two) times daily. 12/01/14  Yes Tresa Garter, MD  naproxen (NAPROSYN) 500 MG tablet Take 1 tablet (500 mg total) by mouth 2 (two) times daily with a meal. 06/03/17  Yes Jegede, Olugbemiga E, MD  Zolpidem Tartrate (AMBIEN PO) Take by mouth.   Yes [provider]  azithromycin (ZITHROMAX) 250 MG tablet Take 1 tablet (250 mg total) by mouth daily. Take first 2 tablets together, then 1 every day until finished. 07/05/17   Tasia Catchings, Amy V, PA-C  cetirizine-pseudoephedrine (ZYRTEC-D) 5-120 MG tablet Take 1 tablet by mouth daily. 07/05/17   Tasia Catchings, Amy V, PA-C  fluticasone (FLONASE) 50 MCG/ACT nasal spray Place 2 sprays into both nostrils daily. 07/05/17   Tasia Catchings, Amy V, PA-C  predniSONE (DELTASONE) 20 MG tablet Take 2 tablets (40 mg total) by mouth daily for 4 days. 07/05/17 07/09/17  Ok Edwards, PA-C    Family History Family History  Problem Relation Age of Onset  . Lupus Sister   . Heart disease Sister     Social History Social History   Tobacco Use  . Smoking status: Current Every Day Smoker    Packs/day: 0.50    Years: 20.00    Pack years: 10.00    Types: Cigarettes  . Smokeless tobacco: Never Used  Substance Use Topics  . Alcohol use: Yes    Comment: occasionally  . Drug use: No     Allergies   Patient has no known allergies.   Review of Systems Review of Systems  Reason unable to perform ROS: See HPI as above.     Physical Exam Triage Vital Signs ED Triage Vitals [07/05/17 1226]  Enc Vitals Group     BP 115/86     Pulse Rate 60      Resp 16     Temp 98.6 F (37 C)     Temp Source Oral     SpO2 97 %     Weight      Height      Head Circumference      Peak Flow      Pain Score      Pain Loc      Pain Edu?      Excl. in [place]?    No data found.  Updated Vital Signs BP 115/86   Pulse 60   Temp 98.6 F (37 C) (Oral)   Resp 16   SpO2 97%   Physical Exam  Constitutional: She is oriented to person, place, and time. She appears well-developed and well-nourished. No distress.  HENT:  Head: Normocephalic and atraumatic.  Right Ear: External ear and ear canal normal. Tympanic membrane is erythematous. Tympanic membrane is not bulging.  Left Ear: External ear and ear canal normal. Tympanic membrane is erythematous. Tympanic membrane is not bulging.  Nose: Rhinorrhea present. Right sinus exhibits no maxillary sinus tenderness and no frontal sinus tenderness. Left sinus exhibits no maxillary sinus tenderness and no frontal sinus tenderness.  Mouth/Throat: Uvula is midline, oropharynx is clear and moist and mucous membranes are normal.  Eyes: Conjunctivae are normal. Pupils are equal, round, and reactive to light.  Neck: Normal range of motion. Neck supple.  Cardiovascular: Normal rate, regular rhythm and normal heart sounds. Exam reveals no gallop and no friction rub.  No murmur heard. Pulmonary/Chest: Effort normal and breath sounds normal. She has no decreased breath sounds. She has no wheezes. She has no rhonchi. She has no rales.  Lymphadenopathy:    She has no cervical adenopathy.  Neurological: She is alert and oriented to person, place, and time.  Skin: Skin is warm and dry.  Psychiatric: She has a normal mood and affect. Her behavior is normal. Judgment normal.     UC Treatments / Results  Labs (all labs ordered are listed, but only abnormal results are displayed) Labs Reviewed - No data to display  EKG  EKG Interpretation None       Radiology No results found.  Procedures Procedures  (including critical care time)  Medications Ordered in UC Medications - No data to display   Initial Impression / Assessment and Plan / UC Course  I have reviewed the triage vital signs and the nursing notes.  Pertinent labs & imaging results that were available during my care of the patient were reviewed by me and considered in my medical decision  making (see chart for details).    Given smoking history, will treat for COPD exacerbation. Azithromycin and prednisone as directed. Albuterol for shortness of breath/wheezing. Other symptomatic treatment discussed. Return precautions given.   Final Clinical Impressions(s) / UC Diagnoses   Final diagnoses:  Bronchitis    ED Discharge Orders        Ordered    azithromycin (ZITHROMAX) 250 MG tablet  Daily     07/05/17 1246    predniSONE (DELTASONE) 20 MG tablet  Daily     07/05/17 1246    fluticasone (FLONASE) 50 MCG/ACT nasal spray  Daily     07/05/17 1246    cetirizine-pseudoephedrine (ZYRTEC-D) 5-120 MG tablet  Daily     07/05/17 1246        Ok Edwards, PA-C 07/05/17 1250

## 2017-07-05 NOTE — ED Triage Notes (Signed)
C/O cough, congestion, chest pain with coughing x 3 days without fevers.

## 2017-07-05 NOTE — Discharge Instructions (Signed)
Azithromycin and prednisone as directed. Start flonase, zyrtec-D for nasal congestion. Albuterol as needed for shortness of breath and wheezing. You can use over the counter nasal saline rinse such as neti pot for nasal congestion. Keep hydrated, your urine should be clear to pale yellow in color. Tylenol/motrin for fever and pain. Monitor for any worsening of symptoms, chest pain, shortness of breath, wheezing, swelling of the throat, follow up for reevaluation.

## 2017-07-09 ENCOUNTER — Other Ambulatory Visit: Payer: Self-pay | Admitting: Internal Medicine

## 2017-07-09 DIAGNOSIS — J45901 Unspecified asthma with (acute) exacerbation: Secondary | ICD-10-CM

## 2017-07-09 DIAGNOSIS — J441 Chronic obstructive pulmonary disease with (acute) exacerbation: Secondary | ICD-10-CM

## 2017-07-09 NOTE — Telephone Encounter (Signed)
Pt. Called called requesting for her PCP to put her back on Advair and Albuterol. Please f/u

## 2017-07-14 MED FILL — !VENTOLIN HFA INHALER: 108 (90 BAS | 24 days supply | Qty: 18 | Fill #0

## 2017-07-29 ENCOUNTER — Other Ambulatory Visit: Payer: Self-pay | Admitting: Internal Medicine

## 2017-07-29 DIAGNOSIS — J441 Chronic obstructive pulmonary disease with (acute) exacerbation: Secondary | ICD-10-CM

## 2017-07-29 DIAGNOSIS — J45901 Unspecified asthma with (acute) exacerbation: Secondary | ICD-10-CM

## 2017-07-29 MED FILL — NAPROXEN 500 MG TABLET: 500 | 30 days supply | Qty: 60 | Fill #1

## 2017-08-05 ENCOUNTER — Other Ambulatory Visit: Payer: Self-pay | Admitting: Pharmacist

## 2017-08-05 DIAGNOSIS — J45901 Unspecified asthma with (acute) exacerbation: Secondary | ICD-10-CM

## 2017-08-05 DIAGNOSIS — J441 Chronic obstructive pulmonary disease with (acute) exacerbation: Secondary | ICD-10-CM

## 2017-08-05 MED ORDER — FLUTICASONE-SALMETEROL 250-50 MCG/DOSE IN AEPB: INHALATION_SPRAY | RESPIRATORY_TRACT | 2 refills | Status: DC

## 2017-08-05 MED FILL — !ADVAIR 250/50 DISKUS: 250-50 | 30 days supply | Qty: 60 | Fill #0

## 2017-08-14 MED FILL — busPIRone HCL 30 MG TABS: 30 | 30 days supply | Qty: 60 | Fill #1

## 2017-08-31 ENCOUNTER — Ambulatory Visit: Payer: Medicaid Other | Attending: Internal Medicine

## 2017-09-07 ENCOUNTER — Ambulatory Visit: Payer: Medicaid Other | Attending: Internal Medicine

## 2017-09-15 MED FILL — busPIRone HCL 30 MG TABS: 30 | 30 days supply | Qty: 60 | Fill #0

## 2017-09-15 MED FILL — !CYMBALTA 60MG CAPSULE: 60 | 30 days supply | Qty: 60 | Fill #0

## 2017-09-16 ENCOUNTER — Encounter: Payer: Self-pay | Admitting: Internal Medicine

## 2017-09-21 ENCOUNTER — Ambulatory Visit: Payer: Medicaid Other | Attending: Internal Medicine

## 2017-10-22 MED FILL — NAPROXEN 500 MG TABLET: 500 | 30 days supply | Qty: 60 | Fill #2

## 2017-10-26 MED FILL — !ADVAIR 250/50 DISKUS: 250-50 | 30 days supply | Qty: 60 | Fill #1

## 2017-10-29 MED FILL — DULoxetine HCL 60 MG CPEP: 60 | 30 days supply | Qty: 60 | Fill #0

## 2017-11-03 MED FILL — busPIRone HCL 30 MG TABS: 30 | 30 days supply | Qty: 60 | Fill #0

## 2017-11-04 ENCOUNTER — Other Ambulatory Visit: Payer: Self-pay | Admitting: Obstetrics and Gynecology

## 2017-11-04 DIAGNOSIS — Z1231 Encounter for screening mammogram for malignant neoplasm of breast: Secondary | ICD-10-CM

## 2017-11-26 ENCOUNTER — Ambulatory Visit (HOSPITAL_COMMUNITY): Payer: Self-pay

## 2017-12-30 MED FILL — CYCLOBENZAPRINE 5 MG TABLET: 5 | 14 days supply | Qty: 28 | Fill #0

## 2017-12-30 MED FILL — DULoxetine HCL 60 MG CPEP: 60 | 30 days supply | Qty: 60 | Fill #0

## 2018-01-17 ENCOUNTER — Other Ambulatory Visit: Payer: Self-pay | Admitting: Internal Medicine

## 2018-01-18 ENCOUNTER — Ambulatory Visit: Payer: Self-pay | Admitting: Internal Medicine

## 2018-01-18 ENCOUNTER — Ambulatory Visit: Payer: Medicaid Other | Attending: Internal Medicine

## 2018-01-29 MED FILL — tiZANidine HCL 4 MG TABS: 4 | 30 days supply | Qty: 60 | Fill #0

## 2018-02-04 ENCOUNTER — Ambulatory Visit (HOSPITAL_COMMUNITY)
Admission: RE | Admit: 2018-02-04 | Discharge: 2018-02-04 | Disposition: A | Discharge status: Home / Self Care | Payer: Self-pay | Source: Ambulatory Visit | Attending: Obstetrics and Gynecology | Admitting: Obstetrics and Gynecology

## 2018-02-04 ENCOUNTER — Encounter (HOSPITAL_COMMUNITY): Payer: Self-pay

## 2018-02-04 ENCOUNTER — Ambulatory Visit
Admission: RE | Admit: 2018-02-04 | Discharge: 2018-02-04 | Disposition: A | Discharge status: Home / Self Care | Payer: No Typology Code available for payment source | Source: Ambulatory Visit | Attending: Obstetrics and Gynecology | Admitting: Obstetrics and Gynecology

## 2018-02-04 VITALS — BP 137/77 | Ht 65.0 in | Wt 214.8 lb

## 2018-02-04 DIAGNOSIS — Z1239 Encounter for other screening for malignant neoplasm of breast: Secondary | ICD-10-CM

## 2018-02-04 DIAGNOSIS — Z1231 Encounter for screening mammogram for malignant neoplasm of breast: Secondary | ICD-10-CM

## 2018-02-04 NOTE — Addendum Note (Signed)
Encounter addended by: Loletta Parish, RN on: 02/04/2018 3:59 PM  Actions taken: Sign clinical note

## 2018-02-04 NOTE — Progress Notes (Signed)
No complaints today. Patient stated she had some right breast pain 2 weeks ago that has resolved.  Pap Smear: Pap smear not completed today. Last Pap smear was 09/24/2015 at the free cervical cancer screening at the [facility] and normal. Per patient has no history of an abnormal Pap smear. Last two Pap smear results are in Epic.  Physical exam: Breasts Breasts symmetrical. No skin abnormalities bilateral breasts. No nipple retraction bilateral breasts. No nipple discharge bilateral breasts. No lymphadenopathy. No lumps palpated bilateral breasts. No complaints of pain or tenderness on exam. Referred patient to the [facility] for a screening mammogram. Appointment scheduled for Thursday, February 04, 2018 at 1540.        Pelvic/Bimanual No Pap smear completed today since last Pap smear was 09/24/2015. Pap smear not indicated per B[facility]CP guidelines.   Smoking History: Patient is a current smoker. Discussed smoking cessation with patient. Referred to the [facility] Quitline and gave resources to free smoking cessation classes at [facility].  Patient Navigation: Patient education provided. Access to services provided for patient through [place] program.   Colorectal Cancer Screening: Per patient has never had a colonoscopy completed. No complaints today. FIT Test given to patient to complete and return to B[facility]CP.  Breast and Cervical Cancer Risk Assessment: Patient has a family history of her mother having breast cancer. Patient has no known genetic mutations or history of radiation treatment to the chest before age 66. Patient has no history of cervical dysplasia, immunocompromised, or DES exposure in-utero. Patient has a 5-year risk for breast cancer at 2.2% and a lifetime risk at 11.7%.

## 2018-02-04 NOTE — Patient Instructions (Addendum)
Explained breast self awareness with Jacqueline Good. Patient did not need a Pap smear today due to last Pap smear was 09/24/2015. Let her know [facility]CP will cover Pap smears every 3 years unless has a history of abnormal Pap smears. Referred patient to the [facility] for a screening mammogram. Appointment scheduled for Thursday, February 04, 2018 at 1540. Let [facility] know the [facility] will follow up with her within the next couple weeks with results of mammogram by letter or phone. Discussed smoking cessation with patient. Referred to the [facility] Quitline and gave resources to free smoking cessation classes at [facility]. Jacqueline Good verbalized understanding.  [place], Arvil Chaco, RN 3:43 PM

## 2018-02-08 ENCOUNTER — Other Ambulatory Visit: Payer: Self-pay | Admitting: Obstetrics and Gynecology

## 2018-02-08 ENCOUNTER — Encounter (HOSPITAL_COMMUNITY): Payer: Self-pay | Admitting: *Deleted

## 2018-02-08 DIAGNOSIS — R928 Other abnormal and inconclusive findings on diagnostic imaging of breast: Secondary | ICD-10-CM

## 2018-02-10 ENCOUNTER — Other Ambulatory Visit: Payer: Self-pay

## 2018-02-15 ENCOUNTER — Ambulatory Visit
Admission: RE | Admit: 2018-02-15 | Discharge: 2018-02-15 | Disposition: A | Discharge status: Home / Self Care | Payer: No Typology Code available for payment source | Source: Ambulatory Visit | Attending: Obstetrics and Gynecology | Admitting: Obstetrics and Gynecology

## 2018-02-15 ENCOUNTER — Other Ambulatory Visit: Payer: Self-pay | Admitting: Obstetrics and Gynecology

## 2018-02-15 DIAGNOSIS — R928 Other abnormal and inconclusive findings on diagnostic imaging of breast: Secondary | ICD-10-CM

## 2018-02-15 DIAGNOSIS — R599 Enlarged lymph nodes, unspecified: Secondary | ICD-10-CM

## 2018-02-15 DIAGNOSIS — N6310 Unspecified lump in the right breast, unspecified quadrant: Secondary | ICD-10-CM

## 2018-02-19 ENCOUNTER — Ambulatory Visit: Payer: Self-pay | Attending: Family Medicine

## 2018-02-25 ENCOUNTER — Ambulatory Visit
Admission: RE | Admit: 2018-02-25 | Discharge: 2018-02-25 | Disposition: A | Discharge status: Home / Self Care | Payer: No Typology Code available for payment source | Source: Ambulatory Visit | Attending: Obstetrics and Gynecology | Admitting: Obstetrics and Gynecology

## 2018-02-25 ENCOUNTER — Other Ambulatory Visit: Payer: Self-pay | Admitting: Obstetrics and Gynecology

## 2018-02-25 DIAGNOSIS — R599 Enlarged lymph nodes, unspecified: Secondary | ICD-10-CM

## 2018-02-25 DIAGNOSIS — N6310 Unspecified lump in the right breast, unspecified quadrant: Secondary | ICD-10-CM

## 2018-03-01 ENCOUNTER — Telehealth: Payer: Self-pay | Admitting: *Deleted

## 2018-03-01 NOTE — Telephone Encounter (Signed)
Left vm for pt to return call to confirm [facility] appt for 03/03/18. Contact information provided.

## 2018-03-02 ENCOUNTER — Telehealth: Payer: Self-pay | Admitting: *Deleted

## 2018-03-02 ENCOUNTER — Encounter: Payer: Self-pay | Admitting: *Deleted

## 2018-03-02 ENCOUNTER — Other Ambulatory Visit: Payer: Self-pay | Admitting: *Deleted

## 2018-03-02 DIAGNOSIS — C50411 Malignant neoplasm of upper-outer quadrant of right female breast: Secondary | ICD-10-CM | POA: Insufficient documentation

## 2018-03-02 DIAGNOSIS — Z171 Estrogen receptor negative status [ER-]: Secondary | ICD-10-CM | POA: Insufficient documentation

## 2018-03-02 NOTE — Telephone Encounter (Signed)
Confirmed [facility] for 03/03/18 at 0815 .  Instructions and contact information given.

## 2018-03-02 NOTE — Progress Notes (Signed)
[place]  [phone] [phone]     ID: Jacqueline Good DOB: Oct 17, 1960  MR#: [id number]  OAC#:[id number]  Patient Care Team: Tresa Garter, MD as PCP - General (Internal Medicine) Alphonsa Overall, MD as Consulting Physician (General Surgery) [name], [name], MD as Consulting Physician (Oncology) Eppie Gibson, MD as Attending Physician (Radiation Oncology) OTHER MD: Donnal Moat, PA-C [FAX [phone]]  CHIEF COMPLAINT: Estrogen receptor negative breast cancer  CURRENT TREATMENT: Neoadjuvant chemoimmunotherapy   HISTORY OF CURRENT ILLNESS: Jacqueline Good had routine screening mammography on 02/05/2018 showing a possible abnormality in the right breast. She underwent unilateral right diagnostic mammography with tomography and right breast ultrasonography at [facility] on 02/15/2018 showing: Highly suspicious right breast mass at 10 o'clock position.  There was a second, 0.6 cm lesion at the 10:00 radiant which has not been biopsied.  Suspicious focal cortical thickening of a single right axillary lymph node.  Accordingly on 02/25/2018 she proceeded to biopsy of the right breast area in question. 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   The patient's subsequent history is as detailed below.  INTERVAL HISTORY: The patient was evaluated in the multidisciplinary breast cancer clinic on 03/03/2018 accompanied by her sister, Levada Dy (who is a Clinical biochemist) and her mother, Rodena Piety. Her case was also presented at the multidisciplinary breast cancer conference on the same day. At that time a preliminary plan was proposed: Biopsy of the second area in question, neoadjuvant  chemotherapy, definitive surgery with targeted axillary dissection, adjuvant radiation.   REVIEW OF SYSTEMS: [place] reports that her counselor is Donnal Moat PA-C.  The patient has a history of asthma.  She smokes 2 packs per week. She consumes a 6 pack of beer weekly. There were no specific symptoms leading to the original mammogram, which was routinely scheduled. The patient denies unusual headaches, visual changes, nausea, vomiting, stiff neck, dizziness, or gait imbalance. She has a chronic cough, but no phlegm production, or pleurisy, no chest pain or pressure, and no change in bowel or bladder habits. The patient denies fever, rash, bleeding, unexplained fatigue or unexplained weight loss. A detailed review of systems was otherwise entirely negative.   PAST MEDICAL HISTORY: Past Medical History:  Diagnosis Date  . Arthritis   . Asthma   . Depression     PAST SURGICAL HISTORY: Past Surgical History:  Procedure Laterality Date  . endometrial ablation    . MYOMECTOMY      FAMILY HISTORY Family History  Problem Relation Age of Onset  . Lupus Sister   . Heart disease Sister   . Breast cancer Mother    She notes that her father is currently 58 years old. Patients' mother is currently 58 years old and she was diagnosed with breast cancer at age 58. The patient has 4 brothers and 1 sister. She has a maternal aunt with breast cancer and her maternal great grandmother with had ovarian cancer.    GYNECOLOGIC HISTORY:  No LMP recorded. Patient is postmenopausal. Menarche: 57 years old Age at first live birth: 57 years old [place] P1 LMP: 13-14 years ago s/p ablation Contraceptive: no HRT: no  Hysterectomy: no SO: no  SOCIAL HISTORY: She is currently unemployed.  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 She lives alone without pets. Her daughter is [name] who lives in [place] and works in Therapist, art.  The patient has one grandchild. She goes to a  [employer].      ADVANCED DIRECTIVES: Not in place.  At the 03/03/2018 visit the patient was given the appropriate documents to complete and notarized at her discretion   HEALTH MAINTENANCE: Social History   Tobacco Use  . Smoking status: Current Every Day Smoker    Packs/day: 1.00    Years: 20.00    Pack years: 20.00    Types: Cigarettes  . Smokeless tobacco: Never Used  Substance Use Topics  . Alcohol use: Yes    Comment: occasionally  . Drug use: No     Colonoscopy: Never  PAP:   Bone density: Never   No Known Allergies  Current Outpatient Medications  Medication Sig Dispense Refill  . albuterol (VENTOLIN HFA) 108 (90 Base) MCG/ACT inhaler INHALE 2 PUFFS INTO THE LUNGS EVERY 6 HOURS AS NEEDED FOR WHEEZING 54 g 3  . ALPRAZolam (XANAX) 0.5 MG tablet Take 1 tablet (0.5 mg total) by mouth 2 (two) times daily as needed for anxiety. 60 tablet 0  . BusPIRone HCl (BUSPAR PO) Take by mouth.    . DULoxetine HCl (CYMBALTA PO) Take by mouth.    . Melatonin 5 MG TABS Take 1 tablet by mouth at bedtime.    . naproxen (NAPROSYN) 500 MG tablet Take 1 tablet (500 mg total) by mouth 2 (two) times daily with a meal. 60 tablet 3  . cetirizine-pseudoephedrine (ZYRTEC-D) 5-120 MG tablet Take 1 tablet by mouth daily. (Patient not taking: Reported on 02/04/2018) 15 tablet 0  . esomeprazole (NEXIUM) 40 MG capsule Take 1 capsule (40 mg total) by mouth daily. (Patient not taking: Reported on 03/03/2018) 30 capsule 5  . fluticasone (FLONASE) 50 MCG/ACT nasal spray Place 2 sprays into both nostrils daily. (Patient not taking: Reported on 03/03/2018) 1 g 0  . Fluticasone-Salmeterol (ADVAIR DISKUS) 250-50 MCG/DOSE AEPB INHALE 1 PUFF INTO THE LUNGS 2 TIMES DAILY. (Patient not taking: Reported on 03/03/2018) 60 each 2   No current facility-administered medications for this visit.     OBJECTIVE: Middle-aged African-American woman who was tearful during today's visit  Vitals:   03/03/18 0849   BP: 134/83  Pulse: (!) 57  Resp: 18  Temp: 97.8 F (36.6 C)  SpO2: 95%     Body mass index is 35.71 kg/m.   Wt Readings from Last 3 Encounters:  03/03/18 214 lb 9.6 oz (97.3 kg)  02/04/18 214 lb 12.8 oz (97.4 kg)  06/03/17 210 lb (95.3 kg)      ECOG FS:1 - Symptomatic but completely ambulatory  Ocular: Sclerae unicteric, pupils round and equal Ear-nose-throat: Oropharynx clear and moist Lymphatic: No cervical or supraclavicular adenopathy Lungs no rales or rhonchi Heart regular rate and rhythm Abd soft, nontender, positive bowel sounds MSK no focal spinal tenderness, no joint edema Neuro: non-focal, well-oriented, anxious, frustrated and depressed affect Breasts: The right breast is status post recent biopsy.  There is a significant ecchymosis.  I do not palpate a well-defined mass.  The left breast is benign.  Both axillae are benign.   LAB RESULTS:  CMP     Component Value Date/Time   NA 141 03/03/2018 0830   K 3.9 03/03/2018 0830   CL 107 03/03/2018 0830   CO2 25 03/03/2018 0830   GLUCOSE 96 03/03/2018 0830   BUN  21 (H) 03/03/2018 0830   CREATININE 0.80 03/03/2018 0830   CREATININE 0.83 09/24/2016 1151   CALCIUM 9.4 03/03/2018 0830   PROT 7.3 03/03/2018 0830   ALBUMIN 4.0 03/03/2018 0830   AST 15 03/03/2018 0830   ALT 16 03/03/2018 0830   ALKPHOS 99 03/03/2018 0830   BILITOT 0.5 03/03/2018 0830   GFRNONAA >60 03/03/2018 0830   GFRNONAA 79 09/24/2016 1151   GFRAA >60 03/03/2018 0830   GFRAA >89 09/24/2016 1151    No results found for: TOTALPROTELP, ALBUMINELP, A1GS, A2GS, BETS, BETA2SER, GAMS, MSPIKE, SPEI  No results found for: KPAFRELGTCHN, LAMBDASER, KAPLAMBRATIO  Lab Results  Component Value Date   WBC 7.4 03/03/2018   NEUTROABS 5.0 03/03/2018   HGB 13.8 03/03/2018   HCT 41.3 03/03/2018   MCV 88.2 03/03/2018   PLT 202 03/03/2018    _0 @  No results found for: LABCA2  No components found for: WCHENI778  No results for  input(s): INR in the last 168 hours.  No results found for: LABCA2  No results found for: EUM353  No results found for: IRW431  No results found for: VQM086  No results found for: CA2729  No components found for: HGQUANT  No results found for: CEA1 / No results found for: CEA1   No results found for: AFPTUMOR  No results found for: CHROMOGRNA  No results found for: PSA1  Appointment on 03/03/2018  Component Date Value Ref Range Status  . Sodium 03/03/2018 141  135 - 145 mmol/L Final  . Potassium 03/03/2018 3.9  3.5 - 5.1 mmol/L Final  . Chloride 03/03/2018 107  98 - 111 mmol/L Final  . CO2 03/03/2018 25  22 - 32 mmol/L Final  . Glucose, Bld 03/03/2018 96  70 - 99 mg/dL Final  . BUN 03/03/2018 21* 6 - 20 mg/dL Final  . Creatinine 03/03/2018 0.80  0.44 - 1.00 mg/dL Final  . Calcium 03/03/2018 9.4  8.9 - 10.3 mg/dL Final  . Total Protein 03/03/2018 7.3  6.5 - 8.1 g/dL Final  . Albumin 03/03/2018 4.0  3.5 - 5.0 g/dL Final  . AST 03/03/2018 15  15 - 41 U/L Final  . ALT 03/03/2018 16  0 - 44 U/L Final  . Alkaline Phosphatase 03/03/2018 99  38 - 126 U/L Final  . Total Bilirubin 03/03/2018 0.5  0.3 - 1.2 mg/dL Final  . GFR, Est Non Af Am 03/03/2018 >60  >60 mL/min Final  . GFR, Est AFR Am 03/03/2018 >60  >60 mL/min Final   Comment: (NOTE) The eGFR has been calculated using the CKD EPI equation. This calculation has not been validated in all clinical situations. eGFR's persistently <60 mL/min signify possible Chronic Kidney Disease.   Georgiann Hahn gap 03/03/2018 9  5 - 15 Final   Performed at [facility], [place] [address] [place], [place] [address]  . WBC Count 03/03/2018 7.4  3.9 - 10.3 K/uL Final  . RBC 03/03/2018 4.68  3.70 - 5.45 MIL/uL Final  . Hemoglobin 03/03/2018 13.8  11.6 - 15.9 g/dL Final  . HCT 03/03/2018 41.3  34.8 - 46.6 % Final  . MCV 03/03/2018 88.2  79.5 - 101.0 fL Final  . MCH 03/03/2018 29.4  25.1 - 34.0 pg Final  . MCHC 03/03/2018  33.3  31.5 - 36.0 g/dL Final  . RDW 03/03/2018 13.7  11.2 - 14.5 % Final  . Platelet Count 03/03/2018 202  145 - 400 K/uL Final  . Neutrophils Relative % 03/03/2018  68  % Final  . Neutro Abs 03/03/2018 5.0  1.5 - 6.5 K/uL Final  . Lymphocytes Relative 03/03/2018 25  % Final  . Lymphs Abs 03/03/2018 1.9  0.9 - 3.3 K/uL Final  . Monocytes Relative 03/03/2018 4  % Final  . Monocytes Absolute 03/03/2018 0.3  0.1 - 0.9 K/uL Final  . Eosinophils Relative 03/03/2018 3  % Final  . Eosinophils Absolute 03/03/2018 0.2  0.0 - 0.5 K/uL Final  . Basophils Relative 03/03/2018 0  % Final  . Basophils Absolute 03/03/2018 0.0  0.0 - 0.1 K/uL Final   Performed at [facility], [address] [place], [place] [address]    (this displays the last labs from the last 3 days)  No results found for: TOTALPROTELP, ALBUMINELP, A1GS, A2GS, BETS, BETA2SER, GAMS, MSPIKE, SPEI (this displays SPEP labs)  No results found for: KPAFRELGTCHN, LAMBDASER, KAPLAMBRATIO (kappa/lambda light chains)  No results found for: HGBA, HGBA2QUANT, HGBFQUANT, HGBSQUAN (Hemoglobinopathy evaluation)   No results found for: LDH  No results found for: IRON, TIBC, IRONPCTSAT (Iron and TIBC)  No results found for: FERRITIN  Urinalysis    Component Value Date/Time   COLORURINE CANCELED 09/24/2016 1151   APPEARANCEUR CANCELED 09/24/2016 1151   LABSPEC CANCELED 09/24/2016 1151   PHURINE CANCELED 09/24/2016 1151   GLUCOSEU CANCELED 09/24/2016 1151   HGBUR CANCELED 09/24/2016 1151   BILIRUBINUR CANCELED 09/24/2016 1151   KETONESUR CANCELED 09/24/2016 1151   PROTEINUR CANCELED 09/24/2016 1151   NITRITE CANCELED 09/24/2016 1151   LEUKOCYTESUR CANCELED 09/24/2016 1151     STUDIES:  US Breast Ltd Uni Right Inc Axilla  Result Date: 02/15/2018 CLINICAL DATA:  57 year old patient was recalled from recent screening mammogram for evaluation of a possible mass with calcifications in the right breast. EXAM:  DIGITAL DIAGNOSTIC RIGHT MAMMOGRAM WITH CAD AND TOMO ULTRASOUND RIGHT BREAST COMPARISON:  February 04, 2018 ACR Breast Density Category c: The breast tissue is heterogeneously dense, which may obscure small masses. FINDINGS: Focal spot compression views of the upper-outer right breast confirm an irregular mass with associated architectural distortion. Within the mass are innumerable microcalcifications. Magnification views of the microcalcifications show a 6 x 8 x 6 mm group of pleomorphic calcifications that are associated with the mass. Mammographic images were processed with CAD. On physical exam, there is a palpable lump in the 10 o'clock position of the right breast approximately 3 cm from nipple, measuring approximately 2.5 cm on physical exam. Targeted ultrasound is performed, showing an irregular hypoechoic mass with internal vascular flow and internal echogenic foci consistent with the pleomorphic calcifications. The mass measures 3.1 x 2.7 x 1.7 cm. There is an adjacent mildly dilated duct. Ultrasound of the right axilla shows a single lymph node with focal bulbous cortical thickening. Cortical thickening measures up to 0.7 cm. There is a preserved fatty hilum. IMPRESSION: 1. Highly suspicious right breast mass at 10 o'clock position. 2. Suspicious focal cortical thickening of a single right axillary lymph node. RECOMMENDATION: Ultrasound-guided core needle biopsy of the right breast is recommended. Ultrasound-guided core needle biopsy of the right axillary lymph node is recommended. Patient was offered the option to schedule the biopsies while she was in our office today. She declined, and says that she will contact us later to schedule. The procedure of ultrasound-guided biopsy was discussed with the patient today. I have discussed the findings and recommendations with the patient and the patient's mother. Results were also provided in writing at the conclusion of the visit.  If applicable, a reminder letter  will be sent to the patient regarding the next appointment. BI-RADS CATEGORY  5: Highly suggestive of malignancy. Electronically Signed   By: Curlene Dolphin M.D.   On: 02/15/2018 16:33   Mm Diag Breast Tomo Uni Right  Result Date: 02/15/2018 CLINICAL DATA:  57 year old patient was recalled from recent screening mammogram for evaluation of a possible mass with calcifications in the right breast. EXAM: DIGITAL DIAGNOSTIC RIGHT MAMMOGRAM WITH CAD AND TOMO ULTRASOUND RIGHT BREAST COMPARISON:  February 04, 2018 ACR Breast Density Category c: The breast tissue is heterogeneously dense, which may obscure small masses. FINDINGS: Focal spot compression views of the upper-outer right breast confirm an irregular mass with associated architectural distortion. Within the mass are innumerable microcalcifications. Magnification views of the microcalcifications show a 6 x 8 x 6 mm group of pleomorphic calcifications that are associated with the mass. Mammographic images were processed with CAD. On physical exam, there is a palpable lump in the 10 o'clock position of the right breast approximately 3 cm from nipple, measuring approximately 2.5 cm on physical exam. Targeted ultrasound is performed, showing an irregular hypoechoic mass with internal vascular flow and internal echogenic foci consistent with the pleomorphic calcifications. The mass measures 3.1 x 2.7 x 1.7 cm. There is an adjacent mildly dilated duct. Ultrasound of the right axilla shows a single lymph node with focal bulbous cortical thickening. Cortical thickening measures up to 0.7 cm. There is a preserved fatty hilum. IMPRESSION: 1. Highly suspicious right breast mass at 10 o'clock position. 2. Suspicious focal cortical thickening of a single right axillary lymph node. RECOMMENDATION: Ultrasound-guided core needle biopsy of the right breast is recommended. Ultrasound-guided core needle biopsy of the right axillary lymph node is recommended. Patient was offered the  option to schedule the biopsies while she was in our office today. She declined, and says that she will contact us later to schedule. The procedure of ultrasound-guided biopsy was discussed with the patient today. I have discussed the findings and recommendations with the patient and the patient's mother. Results were also provided in writing at the conclusion of the visit. If applicable, a reminder letter will be sent to the patient regarding the next appointment. BI-RADS CATEGORY  5: Highly suggestive of malignancy. Electronically Signed   By: Curlene Dolphin M.D.   On: 02/15/2018 16:33   Ms Digital Screening Tomo Bilateral  Result Date: 02/05/2018 CLINICAL DATA:  Screening. EXAM: DIGITAL SCREENING BILATERAL MAMMOGRAM WITH TOMO AND CAD COMPARISON:  Previous exam(s). ACR Breast Density Category c: The breast tissue is heterogeneously dense, which may obscure small masses. FINDINGS: In the right breast, a possible mass with calcifications and distortion warrants further evaluation. In the left breast, no findings suspicious for malignancy. Images were processed with CAD. IMPRESSION: Further evaluation is suggested for possible mass in the right breast. RECOMMENDATION: Diagnostic mammogram and possibly ultrasound of the right breast. (Code:[id number]) The patient will be contacted regarding the findings, and additional imaging will be scheduled. BI-RADS CATEGORY  0: Incomplete. Need additional imaging evaluation and/or prior mammograms for comparison. Electronically Signed   By: Lajean Manes M.D.   On: 02/05/2018 10:04   [place] Axillary Node Core Biopsy Right  Addendum Date: 03/01/2018   ADDENDUM REPORT: 03/01/2018 07:49 ADDENDUM: Pathology revealed MICROSCOPIC FOCUS OF INVASIVE DUCTAL CARCINOMA ARISING IN A BACKGROUND OF HIGH GRADE DUCTAL CARCINOMA IN SITU of the Right breast, outer upper quadrant. METASTATIC BREAST CARCINOMA TO the Right axillary lymph node. This was found to be  concordant by Dr. Kristopher Oppenheim.  Pathology results were discussed with the patient by telephone. The patient reported doing well after the biopsies with tenderness at the sites. Post biopsy instructions and care were reviewed and questions were answered. The patient was encouraged to call [facility] for any additional concerns. The patient was referred to [facility] on March 03, 2018. Recommendation for a bilateral breast MRI for further evaluation of extent of disease and heterogeneously dense breasts. Additionally, a mass in the upper outer quadrant of the right breast at posterior depth is suspicious for an enlarging intramammary lymph node. An ultrasound correlate was identified during real-time scanning at the time of the patient's biopsy. Additional ultrasound guided core biopsy was discussed at the time, but could not be safely performed due to the patient's significant anxiety. Pending breast MRI results, if breast conservation therapy is a consideration, additional localization prior to surgery or additional attempted biopsy/clip placement prior to neoadjuvant chemotherapy is recommended. Pathology results reported by Terie Purser, RN on 03/01/2018. Electronically Signed   By: Kristopher Oppenheim M.D.   On: 03/01/2018 07:49   Result Date: 03/01/2018 CLINICAL DATA:  57 year old female with a suspicious right breast mass and right axillary lymph node. EXAM: ULTRASOUND GUIDED RIGHT BREAST CORE NEEDLE BIOPSY COMPARISON:  Previous exam(s). FINDINGS: I met with the patient and we discussed the procedure of ultrasound-guided biopsy, including benefits and alternatives. We discussed the high likelihood of a successful procedure. We discussed the risks of the procedure, including infection, bleeding, tissue injury, clip migration, and inadequate sampling. Informed written consent was given. The usual time-out protocol was performed immediately prior  to the procedure. Lesion quadrant: Upper-outer quadrant Using sterile technique and 1% Lidocaine as local anesthetic, under direct ultrasound visualization, a 12 gauge spring-loaded device was used to perform biopsy of a right breast mass using a lateral approach. At the conclusion of the procedure a ribbon shaped tissue marker clip was deployed into the biopsy cavity. Using sterile technique and 1% Lidocaine as local anesthetic, under direct ultrasound visualization, a 14 gauge spring-loaded device was used to perform biopsy of a right axillary lymph node using a lateral approach. At the conclusion of the procedure a HydroMARK tissue marker clip was deployed into the biopsy cavity. Follow up 2 view mammogram was performed and dictated separately. Of note, the patient had significant anxiety during the procedure making additional samples difficult to obtain safely. During real-time scanning, an additional round, circumscribed hypoechoic mass is identified at the 10 o'clock position 8 cm from the nipple. The option for an additional third biopsy was discussed with the patient. However, the decision was made not to perform this biopsy due to the patient's significant anxiety at the time of the procedures. IMPRESSION: 1. Ultrasound guided biopsy of a right breast mass and right axillary lymph node. 2. An additional 6 cm hypoechoic mass is identified at the 10 o'clock position 8 cm from the nipple during real-time scanning. This may represent an intramammary lymph node. Additional biopsy was not performed at this time due to the patient's significant level of anxiety. Electronically Signed: By: Kristopher Oppenheim M.D. On: 02/25/2018 15:45   Mm Clip Placement Right  Result Date: 02/25/2018 CLINICAL DATA:  57 year old female status post 2 area biopsy of the right breast and axilla. EXAM: DIAGNOSTIC RIGHT MAMMOGRAM POST ULTRASOUND BIOPSY COMPARISON:  Previous exam(s). FINDINGS: Mammographic images were obtained following  ultrasound-guided biopsy of  the right breast. Ribbon shaped clip is identified in the upper outer quadrant of the right breast at middle depth in relation to the previously identified spiculated mass. A HydroMARK clip is identified in the right axilla. A 6 mm circumscribed masses identified in the upper outer quadrant at posterior depth, likely relating to the additional sonographically identified mass. IMPRESSION: 1. Post biopsy clips in the expected location of the right breast and right axilla. 2. Additional 6 mm circumscribed mass in the upper outer quadrant posterior depth likely correlates with the sonographically identified mass. Findings are suggestive of an intramammary lymph node. Additional localization of this area is recommended pending pathology results. The patient had difficulty tolerating the biopsies due to significant anxiety. Final Assessment: Post Procedure Mammograms for Marker Placement Electronically Signed   By: Kristopher Oppenheim M.D.   On: 02/25/2018 15:48   [place] Rt Breast Bx W Loc Dev 1st Lesion Img Bx Spec US Guide  Addendum Date: 03/01/2018   ADDENDUM REPORT: 03/01/2018 07:49 ADDENDUM: Pathology revealed MICROSCOPIC FOCUS OF INVASIVE DUCTAL CARCINOMA ARISING IN A BACKGROUND OF HIGH GRADE DUCTAL CARCINOMA IN SITU of the Right breast, outer upper quadrant. METASTATIC BREAST CARCINOMA TO the Right axillary lymph node. This was found to be concordant by Dr. Kristopher Oppenheim. Pathology results were discussed with the patient by telephone. The patient reported doing well after the biopsies with tenderness at the sites. Post biopsy instructions and care were reviewed and questions were answered. The patient was encouraged to call [facility] for any additional concerns. The patient was referred to [facility] on March 03, 2018. Recommendation for a bilateral breast MRI for further evaluation of  extent of disease and heterogeneously dense breasts. Additionally, a mass in the upper outer quadrant of the right breast at posterior depth is suspicious for an enlarging intramammary lymph node. An ultrasound correlate was identified during real-time scanning at the time of the patient's biopsy. Additional ultrasound guided core biopsy was discussed at the time, but could not be safely performed due to the patient's significant anxiety. Pending breast MRI results, if breast conservation therapy is a consideration, additional localization prior to surgery or additional attempted biopsy/clip placement prior to neoadjuvant chemotherapy is recommended. Pathology results reported by Terie Purser, RN on 03/01/2018. Electronically Signed   By: Kristopher Oppenheim M.D.   On: 03/01/2018 07:49   Result Date: 03/01/2018 CLINICAL DATA:  57 year old female with a suspicious right breast mass and right axillary lymph node. EXAM: ULTRASOUND GUIDED RIGHT BREAST CORE NEEDLE BIOPSY COMPARISON:  Previous exam(s). FINDINGS: I met with the patient and we discussed the procedure of ultrasound-guided biopsy, including benefits and alternatives. We discussed the high likelihood of a successful procedure. We discussed the risks of the procedure, including infection, bleeding, tissue injury, clip migration, and inadequate sampling. Informed written consent was given. The usual time-out protocol was performed immediately prior to the procedure. Lesion quadrant: Upper-outer quadrant Using sterile technique and 1% Lidocaine as local anesthetic, under direct ultrasound visualization, a 12 gauge spring-loaded device was used to perform biopsy of a right breast mass using a lateral approach. At the conclusion of the procedure a ribbon shaped tissue marker clip was deployed into the biopsy cavity. Using sterile technique and 1% Lidocaine as local anesthetic, under direct ultrasound visualization, a 14 gauge spring-loaded device was used to perform  biopsy of a right axillary lymph node using a lateral approach. At the conclusion  of the procedure a HydroMARK tissue marker clip was deployed into the biopsy cavity. Follow up 2 view mammogram was performed and dictated separately. Of note, the patient had significant anxiety during the procedure making additional samples difficult to obtain safely. During real-time scanning, an additional round, circumscribed hypoechoic mass is identified at the 10 o'clock position 8 cm from the nipple. The option for an additional third biopsy was discussed with the patient. However, the decision was made not to perform this biopsy due to the patient's significant anxiety at the time of the procedures. IMPRESSION: 1. Ultrasound guided biopsy of a right breast mass and right axillary lymph node. 2. An additional 6 cm hypoechoic mass is identified at the 10 o'clock position 8 cm from the nipple during real-time scanning. This may represent an intramammary lymph node. Additional biopsy was not performed at this time due to the patient's significant level of anxiety. Electronically Signed: By: Kristopher Oppenheim M.D. On: 02/25/2018 15:45    ELIGIBLE FOR AVAILABLE RESEARCH PROTOCOL:BCEP  ASSESSMENT: 57 y.o.  woman status post right breast upper outer quadrant biopsy 02/25/2018 for a clinical T2 pN1, stage IIB invasive ductal carcinoma, estrogen and progesterone receptor negative, HER-2 amplified, with an MIB-1 of 50%.  (a) biopsy of a 0.6 cm suspicious area in the right breast pending  (1) genetics testing pending  (2) neoadjuvant chemotherapy will consist of carboplatin, Taxotere, trastuzumab and Pertuzumab every 21 days x 6  (3) anti-HER-2 treatment will continue for a minimum of 6 months  (a) baseline echocardiogram   (4) definitive surgery with targeted axillary dissection to follow  (5) adjuvant radiation  PLAN: We spent the better part of today's hour-long appointment discussing the biology of her  diagnosis and the specifics of her situation. We first reviewed the fact that cancer is not one disease but more than 100 different diseases and that it is important to keep them separate-- otherwise when friends and relatives discuss their own cancer experiences with [place] confusion can result. Similarly we explained that if breast cancer spreads to the bone or liver, the patient would not have bone cancer or liver cancer, but breast cancer in the bone and breast cancer in the liver: one cancer in three places-- not 3 different cancers which otherwise would have to be treated in 3 different ways.  We discussed the difference between local and systemic therapy. In terms of loco-regional treatment, lumpectomy plus radiation is equivalent to mastectomy as far as survival is concerned. For this reason, and because the cosmetic results are generally superior, we recommend breast conserving surgery.   We also noted that in terms of sequencing of treatments, whether systemic therapy or surgery is done first does not affect the ultimate outcome.  This is relevant to her case since we recommend neoadjuvant chemotherapy both to make her definitive surgery easier and increase the chance of keeping her breast, but also because it will give [place] prognostic information.  We then discussed the rationale for systemic therapy. There is some risk that this cancer may have already spread to other parts of her body.  She will be scheduled for a CT scan of the chest and a bone scan which I expect to be negative.  That does not mean the cancer is not there only that if it is it is microscopic and not detectable.  Clearly no amount of local treatment will cure a cancer that is already microscopically systemic  Accordingly we went over the options for systemic therapy which  are anti-estrogens, anti-HER-2 immunotherapy, and chemotherapy. Lillah is a good candidate for anti-HER-2 immunotherapy and chemotherapy. She does not meet  criteria for anti-estrogen therapy.  Accordingly the overall plan will be to start with neoadjuvant chemotherapy, namely carboplatin and docetaxel, together with immunotherapy namely trastuzumab and Pertuzumab.  At the end of chemotherapy she will proceed to surgery and then radiation.  She understands the immunotherapy will continue a minimum of 6 months and possibly up to 1 year.  We also discussed the possible toxicity side effects and complications of these agents.  Finally the patient does qualify for genetics testing. In patients who carry a deleterious mutation [for example in a  BRCA gene], the risk of a new breast cancer developing in the future may be sufficiently great that the patient may choose bilateral mastectomies. However if she wishes to keep her breasts in that situation it is safe to do so. That would require intensified screening, which generally means not only yearly mammography but a yearly breast MRI as well. Of course, if there is a deleterious mutation bilateral oophorectomy might be necessary as there is no standard screening protocol for ovarian cancer.  Sher has a good understanding of the overall plan. She agrees with it. She knows the goal of treatment in her case is cure. She will call with any problems that may develop before her next visit here.  [name], [name], MD  03/03/18 11:01 AM Medical Oncology and Hematology [facility] [address] [place], [place] [address] Tel. [phone]    Fax. [phone]    I, Soijett Blue am acting as scribe for Dr. Sarajane Jews C. [name].  I, Lurline Del MD, have reviewed the above documentation for accuracy and completeness, and I agree with the above.

## 2018-03-03 ENCOUNTER — Other Ambulatory Visit: Payer: Self-pay

## 2018-03-03 ENCOUNTER — Encounter: Payer: Self-pay | Admitting: Radiation Oncology

## 2018-03-03 ENCOUNTER — Other Ambulatory Visit: Payer: Self-pay | Admitting: *Deleted

## 2018-03-03 ENCOUNTER — Ambulatory Visit
Admission: RE | Admit: 2018-03-03 | Discharge: 2018-03-03 | Disposition: A | Discharge status: Home / Self Care | Payer: No Typology Code available for payment source | Source: Ambulatory Visit | Attending: Radiation Oncology | Admitting: Radiation Oncology

## 2018-03-03 ENCOUNTER — Inpatient Hospital Stay: Payer: Medicaid Other | Attending: Oncology | Admitting: Oncology

## 2018-03-03 ENCOUNTER — Encounter: Payer: Self-pay | Admitting: *Deleted

## 2018-03-03 ENCOUNTER — Encounter: Payer: Self-pay | Admitting: Oncology

## 2018-03-03 ENCOUNTER — Inpatient Hospital Stay: Payer: Medicaid Other

## 2018-03-03 ENCOUNTER — Ambulatory Visit: Payer: No Typology Code available for payment source | Attending: Surgery | Admitting: Physical Therapy

## 2018-03-03 VITALS — BP 134/83 | HR 57 | Temp 97.8°F | Resp 18 | Ht 65.0 in | Wt 214.6 lb

## 2018-03-03 DIAGNOSIS — J45901 Unspecified asthma with (acute) exacerbation: Secondary | ICD-10-CM

## 2018-03-03 DIAGNOSIS — Z171 Estrogen receptor negative status [ER-]: Secondary | ICD-10-CM

## 2018-03-03 DIAGNOSIS — C50411 Malignant neoplasm of upper-outer quadrant of right female breast: Secondary | ICD-10-CM | POA: Insufficient documentation

## 2018-03-03 DIAGNOSIS — Z803 Family history of malignant neoplasm of breast: Secondary | ICD-10-CM | POA: Diagnosis not present

## 2018-03-03 DIAGNOSIS — C773 Secondary and unspecified malignant neoplasm of axilla and upper limb lymph nodes: Secondary | ICD-10-CM | POA: Diagnosis not present

## 2018-03-03 DIAGNOSIS — Z780 Asymptomatic menopausal state: Secondary | ICD-10-CM | POA: Insufficient documentation

## 2018-03-03 DIAGNOSIS — F17210 Nicotine dependence, cigarettes, uncomplicated: Secondary | ICD-10-CM | POA: Insufficient documentation

## 2018-03-03 DIAGNOSIS — R293 Abnormal posture: Secondary | ICD-10-CM

## 2018-03-03 DIAGNOSIS — J441 Chronic obstructive pulmonary disease with (acute) exacerbation: Secondary | ICD-10-CM

## 2018-03-03 DIAGNOSIS — J45909 Unspecified asthma, uncomplicated: Secondary | ICD-10-CM

## 2018-03-03 LAB — CBC WITH DIFFERENTIAL (CANCER CENTER ONLY)
Basophils Absolute: 0 10*3/uL (ref 0.0–0.1)
Basophils Relative: 0 %
EOS ABS: 0.2 10*3/uL (ref 0.0–0.5)
Eosinophils Relative: 3 %
HCT: 41.3 % (ref 34.8–46.6)
HEMOGLOBIN: 13.8 g/dL (ref 11.6–15.9)
LYMPHS ABS: 1.9 10*3/uL (ref 0.9–3.3)
Lymphocytes Relative: 25 %
MCH: 29.4 pg (ref 25.1–34.0)
MCHC: 33.3 g/dL (ref 31.5–36.0)
MCV: 88.2 fL (ref 79.5–101.0)
MONO ABS: 0.3 10*3/uL (ref 0.1–0.9)
MONOS PCT: 4 %
NEUTROS PCT: 68 %
Neutro Abs: 5 10*3/uL (ref 1.5–6.5)
Platelet Count: 202 10*3/uL (ref 145–400)
RBC: 4.68 MIL/uL (ref 3.70–5.45)
RDW: 13.7 % (ref 11.2–14.5)
WBC Count: 7.4 10*3/uL (ref 3.9–10.3)

## 2018-03-03 LAB — CMP (CANCER CENTER ONLY)
ALT: 16 U/L (ref 0–44)
ANION GAP: 9 (ref 5–15)
AST: 15 U/L (ref 15–41)
Albumin: 4 g/dL (ref 3.5–5.0)
Alkaline Phosphatase: 99 U/L (ref 38–126)
BUN: 21 mg/dL — ABNORMAL HIGH (ref 6–20)
CO2: 25 mmol/L (ref 22–32)
Calcium: 9.4 mg/dL (ref 8.9–10.3)
Chloride: 107 mmol/L (ref 98–111)
Creatinine: 0.8 mg/dL (ref 0.44–1.00)
Glucose, Bld: 96 mg/dL (ref 70–99)
POTASSIUM: 3.9 mmol/L (ref 3.5–5.1)
Sodium: 141 mmol/L (ref 135–145)
Total Bilirubin: 0.5 mg/dL (ref 0.3–1.2)
Total Protein: 7.3 g/dL (ref 6.5–8.1)

## 2018-03-03 MED ORDER — LORAZEPAM 0.5 MG PO TABS
0.5000 mg | ORAL_TABLET | Freq: Every evening | ORAL | 0 refills | Status: DC | PRN
Start: 2018-03-03 — End: 2018-04-14

## 2018-03-03 MED ORDER — DEXAMETHASONE 4 MG PO TABS: 8.0000 mg | ORAL_TABLET | Freq: Two times a day (BID) | ORAL | 1 refills | Status: DC

## 2018-03-03 MED ORDER — PROCHLORPERAZINE MALEATE 10 MG PO TABS: 10.0000 mg | ORAL_TABLET | Freq: Four times a day (QID) | ORAL | 1 refills | Status: DC | PRN

## 2018-03-03 MED ORDER — LIDOCAINE-PRILOCAINE 2.5-2.5 % EX CREA: TOPICAL_CREAM | CUTANEOUS | 3 refills | Status: DC

## 2018-03-03 NOTE — Patient Instructions (Signed)

## 2018-03-03 NOTE — Progress Notes (Signed)
Radiation Oncology         [phone]) [phone] ________________________________  Initial outpatient Consultation  Name: Jacqueline Good MRN: [id number]  Date: 03/03/2018  DOB: 12/19/1960  ZD:GUYQIH, Jacqueline Good  Alphonsa Overall, Good   REFERRING PHYSICIAN: Alphonsa Overall, Good  DIAGNOSIS:    ICD-10-CM   1. 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 She underwent bilateral diagnostic mammography with tomography and [facility] showing: highly suspicious right breast mass at 10 o'clock position; suspicious focal cortical thickening of a single right axillary lymph node. The mass measured 3.1 x 2.7 x 1.7 cm.   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 Prognostic indicators significant for: ER, 0% negative and PR, 0% negative. Proliferation marker Ki67 at 50%. HER2 positive.  Menarche: 57years old Age at first live birth: 57years old GP: 1 LMP: not specified Contraceptive: no HRT: no   On review of systems, patient notes ear drainage, sinus problems, palpitations, dry cough, heartburn, right breast  lump, back pain, arthritis, numbness, anxiety, depression, phobias. Patient denies any other symptoms.   PREVIOUS RADIATION THERAPY: No  PAST MEDICAL HISTORY:  has a past medical history of Arthritis, Asthma, and Depression.    PAST SURGICAL HISTORY: Past Surgical History:  Procedure Laterality Date  . endometrial ablation    . MYOMECTOMY      FAMILY HISTORY: family history includes Breast cancer in her mother; Heart disease in her sister; Lupus in her sister.  SOCIAL HISTORY:  reports that she has been smoking cigarettes.  She has a 20.00 pack-year smoking history. She has never used smokeless tobacco. She reports that she drinks alcohol. She reports that she does not use drugs.  ALLERGIES: Patient has no known allergies.  MEDICATIONS:  Current Outpatient Medications  Medication Sig Dispense Refill  . albuterol (VENTOLIN HFA) 108 (90 Base) MCG/ACT inhaler INHALE 2 PUFFS INTO THE LUNGS EVERY 6 HOURS AS NEEDED FOR WHEEZING 54 g 3  . ALPRAZolam (XANAX) 0.5 MG tablet Take 1 tablet (0.5 mg total) by mouth 2 (two) times daily as needed for anxiety. 60 tablet 0  . BusPIRone HCl (BUSPAR PO) Take by mouth.    . cetirizine-pseudoephedrine (ZYRTEC-D) 5-120 MG tablet Take 1 tablet by mouth daily. (Patient not taking: Reported on 02/04/2018) 15 tablet 0  . DULoxetine HCl (CYMBALTA PO) Take by mouth.    . esomeprazole (NEXIUM) 40 MG capsule Take 1 capsule (40 mg total) by mouth daily. (Patient not taking: Reported on 03/03/2018) 30 capsule 5  . fluticasone (FLONASE) 50 MCG/ACT nasal spray Place 2 sprays into both nostrils daily. (Patient not taking: Reported on 03/03/2018) 1 g 0  . Fluticasone-Salmeterol (ADVAIR  DISKUS) 250-50 MCG/DOSE AEPB INHALE 1 PUFF INTO THE LUNGS 2 TIMES DAILY. (Patient not taking: Reported on 03/03/2018) 60 each 2  . Melatonin 5 MG TABS Take 1 tablet by mouth at bedtime.    . naproxen (NAPROSYN) 500 MG tablet Take 1 tablet (500 mg total) by mouth 2 (two) times daily with a meal.  60 tablet 3   No current facility-administered medications for this encounter.     REVIEW OF SYSTEMS: A 10+ POINT REVIEW OF SYSTEMS WAS OBTAINED including neurology, dermatology, psychiatry, cardiac, respiratory, lymph, extremities, GI, GU, Musculoskeletal, constitutional, breasts, reproductive, HEENT.  All pertinent positives are noted in the HPI.  All others are negative.   PHYSICAL EXAM:  Vitals with BMI 03/03/2018  Height _0   Weight 214 lbs 10 oz  BMI 53.64  Systolic 680  Diastolic 83  Pulse 57  Respirations 18  General: Alert and oriented, crying HEENT: Head is normocephalic. Extraocular movements are intact. Oropharynx is clear. Neck: Neck is supple, no palpable cervical or supraclavicular lymphadenopathy. Heart: Regular in rate and rhythm with no murmurs, rubs, or gallops. Chest: Clear to auscultation bilaterally, with no rhonchi, wheezes, or rales. Abdomen: Soft, nontender, nondistended, with no rigidity or guarding. Extremities: No cyanosis or edema. Lymphatics: see Neck Exam Skin: No concerning lesions. Musculoskeletal: symmetric strength and muscle tone throughout. Neurologic: Cranial nerves II through XII are grossly intact. No obvious focalities. Speech is fluent. Coordination is intact. Psychiatric: Judgment and insight are intact. Affect is tearful Breasts: At the 9 o'clock region of the right breast, there is a mass with surrounding bruising. The mass is about 3 cm. No other palpable masses appreciated in the breasts or axillae.  ECOG = 0  0 - Asymptomatic (Fully active, able to carry on all predisease activities without restriction)  1 - Symptomatic but completely ambulatory (Restricted in physically strenuous activity but ambulatory and able to carry out work of a light or sedentary nature. For example, light housework, office work)  2 - Symptomatic, <50% in bed during the day (Ambulatory and capable of all self care but unable to carry out any work activities.  Up and about more than 50% of waking hours)  3 - Symptomatic, >50% in bed, but not bedbound (Capable of only limited self-care, confined to bed or chair 50% or more of waking hours)  4 - Bedbound (Completely disabled. Cannot carry on any self-care. Totally confined to bed or chair)  5 - Death   Eustace Pen MM, Creech RH, Tormey DC, et al. [phone]). "Toxicity and response criteria of the [facility]". [place] Oncol. 5 (6): 649-55   LABORATORY DATA:  Lab Results  Component Value Date   WBC 7.4 03/03/2018   HGB 13.8 03/03/2018   HCT 41.3 03/03/2018   MCV 88.2 03/03/2018   PLT 202 03/03/2018   CMP     Component Value Date/Time   NA 141 03/03/2018 0830   K 3.9 03/03/2018 0830   CL 107 03/03/2018 0830   CO2 25 03/03/2018 0830   GLUCOSE 96 03/03/2018 0830   BUN 21 (H) 03/03/2018 0830   CREATININE 0.80 03/03/2018 0830   CREATININE 0.83 09/24/2016 1151   CALCIUM 9.4 03/03/2018 0830   PROT 7.3 03/03/2018 0830   ALBUMIN 4.0 03/03/2018 0830   AST 15 03/03/2018 0830   ALT 16 03/03/2018 0830   ALKPHOS 99 03/03/2018 0830   BILITOT 0.5 03/03/2018 0830   GFRNONAA >60 03/03/2018 0830   GFRNONAA 79 09/24/2016 1151  GFRAA >60 03/03/2018 0830   GFRAA >89 09/24/2016 1151         RADIOGRAPHY: US Breast Ltd Uni Right Inc Axilla  Result Date: 02/15/2018 CLINICAL DATA:  57 year old patient was recalled from recent screening mammogram for evaluation of a possible mass with calcifications in the right breast. EXAM: DIGITAL DIAGNOSTIC RIGHT MAMMOGRAM WITH CAD AND TOMO ULTRASOUND RIGHT BREAST COMPARISON:  February 04, 2018 ACR Breast Density Category c: The breast tissue is heterogeneously dense, which may obscure small masses. FINDINGS: Focal spot compression views of the upper-outer right breast confirm an irregular mass with associated architectural distortion. Within the mass are innumerable microcalcifications. Magnification views of the microcalcifications show a 6 x 8 x 6 mm  group of pleomorphic calcifications that are associated with the mass. Mammographic images were processed with CAD. On physical exam, there is a palpable lump in the 10 o'clock position of the right breast approximately 3 cm from nipple, measuring approximately 2.5 cm on physical exam. Targeted ultrasound is performed, showing an irregular hypoechoic mass with internal vascular flow and internal echogenic foci consistent with the pleomorphic calcifications. The mass measures 3.1 x 2.7 x 1.7 cm. There is an adjacent mildly dilated duct. Ultrasound of the right axilla shows a single lymph node with focal bulbous cortical thickening. Cortical thickening measures up to 0.7 cm. There is a preserved fatty hilum. IMPRESSION: 1. Highly suspicious right breast mass at 10 o'clock position. 2. Suspicious focal cortical thickening of a single right axillary lymph node. RECOMMENDATION: Ultrasound-guided core needle biopsy of the right breast is recommended. Ultrasound-guided core needle biopsy of the right axillary lymph node is recommended. Patient was offered the option to schedule the biopsies while she was in our office today. She declined, and says that she will contact us later to schedule. The procedure of ultrasound-guided biopsy was discussed with the patient today. I have discussed the findings and recommendations with the patient and the patient's mother. Results were also provided in writing at the conclusion of the visit. If applicable, a reminder letter will be sent to the patient regarding the next appointment. BI-RADS CATEGORY  5: Highly suggestive of malignancy. Electronically Signed   By: Curlene Dolphin M.D.   On: 02/15/2018 16:33   Mm Diag Breast Tomo Uni Right  Result Date: 02/15/2018 CLINICAL DATA:  57 year old patient was recalled from recent screening mammogram for evaluation of a possible mass with calcifications in the right breast. EXAM: DIGITAL DIAGNOSTIC RIGHT MAMMOGRAM WITH CAD AND TOMO ULTRASOUND  RIGHT BREAST COMPARISON:  February 04, 2018 ACR Breast Density Category c: The breast tissue is heterogeneously dense, which may obscure small masses. FINDINGS: Focal spot compression views of the upper-outer right breast confirm an irregular mass with associated architectural distortion. Within the mass are innumerable microcalcifications. Magnification views of the microcalcifications show a 6 x 8 x 6 mm group of pleomorphic calcifications that are associated with the mass. Mammographic images were processed with CAD. On physical exam, there is a palpable lump in the 10 o'clock position of the right breast approximately 3 cm from nipple, measuring approximately 2.5 cm on physical exam. Targeted ultrasound is performed, showing an irregular hypoechoic mass with internal vascular flow and internal echogenic foci consistent with the pleomorphic calcifications. The mass measures 3.1 x 2.7 x 1.7 cm. There is an adjacent mildly dilated duct. Ultrasound of the right axilla shows a single lymph node with focal bulbous cortical thickening. Cortical thickening measures up to 0.7 cm. There is a preserved fatty hilum.  IMPRESSION: 1. Highly suspicious right breast mass at 10 o'clock position. 2. Suspicious focal cortical thickening of a single right axillary lymph node. RECOMMENDATION: Ultrasound-guided core needle biopsy of the right breast is recommended. Ultrasound-guided core needle biopsy of the right axillary lymph node is recommended. Patient was offered the option to schedule the biopsies while she was in our office today. She declined, and says that she will contact us later to schedule. The procedure of ultrasound-guided biopsy was discussed with the patient today. I have discussed the findings and recommendations with the patient and the patient's mother. Results were also provided in writing at the conclusion of the visit. If applicable, a reminder letter will be sent to the patient regarding the next appointment.  BI-RADS CATEGORY  5: Highly suggestive of malignancy. Electronically Signed   By: Curlene Dolphin M.D.   On: 02/15/2018 16:33   Ms Digital Screening Tomo Bilateral  Result Date: 02/05/2018 CLINICAL DATA:  Screening. EXAM: DIGITAL SCREENING BILATERAL MAMMOGRAM WITH TOMO AND CAD COMPARISON:  Previous exam(s). ACR Breast Density Category c: The breast tissue is heterogeneously dense, which may obscure small masses. FINDINGS: In the right breast, a possible mass with calcifications and distortion warrants further evaluation. In the left breast, no findings suspicious for malignancy. Images were processed with CAD. IMPRESSION: Further evaluation is suggested for possible mass in the right breast. RECOMMENDATION: Diagnostic mammogram and possibly ultrasound of the right breast. (Code:[id number]) The patient will be contacted regarding the findings, and additional imaging will be scheduled. BI-RADS CATEGORY  0: Incomplete. Need additional imaging evaluation and/or prior mammograms for comparison. Electronically Signed   By: Lajean Manes M.D.   On: 02/05/2018 10:04   [place] Axillary Node Core Biopsy Right  Addendum Date: 03/01/2018   ADDENDUM REPORT: 03/01/2018 07:49 ADDENDUM: Pathology revealed MICROSCOPIC FOCUS OF INVASIVE DUCTAL CARCINOMA ARISING IN A BACKGROUND OF HIGH GRADE DUCTAL CARCINOMA IN SITU of the Right breast, outer upper quadrant. METASTATIC BREAST CARCINOMA TO the Right axillary lymph node. This was found to be concordant by Dr. Kristopher Oppenheim. Pathology results were discussed with the patient by telephone. The patient reported doing well after the biopsies with tenderness at the sites. Post biopsy instructions and care were reviewed and questions were answered. The patient was encouraged to call [facility] for any additional concerns. The patient was referred to [facility] on March 03, 2018.  Recommendation for a bilateral breast MRI for further evaluation of extent of disease and heterogeneously dense breasts. Additionally, a mass in the upper outer quadrant of the right breast at posterior depth is suspicious for an enlarging intramammary lymph node. An ultrasound correlate was identified during real-time scanning at the time of the patient's biopsy. Additional ultrasound guided core biopsy was discussed at the time, but could not be safely performed due to the patient's significant anxiety. Pending breast MRI results, if breast conservation therapy is a consideration, additional localization prior to surgery or additional attempted biopsy/clip placement prior to neoadjuvant chemotherapy is recommended. Pathology results reported by Terie Purser, RN on 03/01/2018. Electronically Signed   By: Kristopher Oppenheim M.D.   On: 03/01/2018 07:49   Result Date: 03/01/2018 CLINICAL DATA:  57 year old Good with a suspicious right breast mass and right axillary lymph node. EXAM: ULTRASOUND GUIDED RIGHT BREAST CORE NEEDLE BIOPSY COMPARISON:  Previous exam(s). FINDINGS: I met with the patient and we discussed the procedure of ultrasound-guided biopsy, including benefits and alternatives.  We discussed the high likelihood of a successful procedure. We discussed the risks of the procedure, including infection, bleeding, tissue injury, clip migration, and inadequate sampling. Informed written consent was given. The usual time-out protocol was performed immediately prior to the procedure. Lesion quadrant: Upper-outer quadrant Using sterile technique and 1% Lidocaine as local anesthetic, under direct ultrasound visualization, a 12 gauge spring-loaded device was used to perform biopsy of a right breast mass using a lateral approach. At the conclusion of the procedure a ribbon shaped tissue marker clip was deployed into the biopsy cavity. Using sterile technique and 1% Lidocaine as local anesthetic, under direct ultrasound  visualization, a 14 gauge spring-loaded device was used to perform biopsy of a right axillary lymph node using a lateral approach. At the conclusion of the procedure a HydroMARK tissue marker clip was deployed into the biopsy cavity. Follow up 2 view mammogram was performed and dictated separately. Of note, the patient had significant anxiety during the procedure making additional samples difficult to obtain safely. During real-time scanning, an additional round, circumscribed hypoechoic mass is identified at the 10 o'clock position 8 cm from the nipple. The option for an additional third biopsy was discussed with the patient. However, the decision was made not to perform this biopsy due to the patient's significant anxiety at the time of the procedures. IMPRESSION: 1. Ultrasound guided biopsy of a right breast mass and right axillary lymph node. 2. An additional 6 cm hypoechoic mass is identified at the 10 o'clock position 8 cm from the nipple during real-time scanning. This may represent an intramammary lymph node. Additional biopsy was not performed at this time due to the patient's significant level of anxiety. Electronically Signed: By: Kristopher Oppenheim M.D. On: 02/25/2018 15:45   Mm Clip Placement Right  Result Date: 02/25/2018 CLINICAL DATA:  57 year old Good status post 2 area biopsy of the right breast and axilla. EXAM: DIAGNOSTIC RIGHT MAMMOGRAM POST ULTRASOUND BIOPSY COMPARISON:  Previous exam(s). FINDINGS: Mammographic images were obtained following ultrasound-guided biopsy of the right breast. Ribbon shaped clip is identified in the upper outer quadrant of the right breast at middle depth in relation to the previously identified spiculated mass. A HydroMARK clip is identified in the right axilla. A 6 mm circumscribed masses identified in the upper outer quadrant at posterior depth, likely relating to the additional sonographically identified mass. IMPRESSION: 1. Post biopsy clips in the expected  location of the right breast and right axilla. 2. Additional 6 mm circumscribed mass in the upper outer quadrant posterior depth likely correlates with the sonographically identified mass. Findings are suggestive of an intramammary lymph node. Additional localization of this area is recommended pending pathology results. The patient had difficulty tolerating the biopsies due to significant anxiety. Final Assessment: Post Procedure Mammograms for Marker Placement Electronically Signed   By: Kristopher Oppenheim M.D.   On: 02/25/2018 15:48   [place] Rt Breast Bx W Loc Dev 1st Lesion Img Bx Spec US Guide  Addendum Date: 03/01/2018   ADDENDUM REPORT: 03/01/2018 07:49 ADDENDUM: Pathology revealed MICROSCOPIC FOCUS OF INVASIVE DUCTAL CARCINOMA ARISING IN A BACKGROUND OF HIGH GRADE DUCTAL CARCINOMA IN SITU of the Right breast, outer upper quadrant. METASTATIC BREAST CARCINOMA TO the Right axillary lymph node. This was found to be concordant by Dr. Kristopher Oppenheim. Pathology results were discussed with the patient by telephone. The patient reported doing well after the biopsies with tenderness at the sites. Post biopsy instructions and care were reviewed and questions were answered. The patient was  encouraged to call [facility] for any additional concerns. The patient was referred to [facility] on March 03, 2018. Recommendation for a bilateral breast MRI for further evaluation of extent of disease and heterogeneously dense breasts. Additionally, a mass in the upper outer quadrant of the right breast at posterior depth is suspicious for an enlarging intramammary lymph node. An ultrasound correlate was identified during real-time scanning at the time of the patient's biopsy. Additional ultrasound guided core biopsy was discussed at the time, but could not be safely performed due to the patient's significant anxiety. Pending breast  MRI results, if breast conservation therapy is a consideration, additional localization prior to surgery or additional attempted biopsy/clip placement prior to neoadjuvant chemotherapy is recommended. Pathology results reported by Terie Purser, RN on 03/01/2018. Electronically Signed   By: Kristopher Oppenheim M.D.   On: 03/01/2018 07:49   Result Date: 03/01/2018 CLINICAL DATA:  57 year old Good with a suspicious right breast mass and right axillary lymph node. EXAM: ULTRASOUND GUIDED RIGHT BREAST CORE NEEDLE BIOPSY COMPARISON:  Previous exam(s). FINDINGS: I met with the patient and we discussed the procedure of ultrasound-guided biopsy, including benefits and alternatives. We discussed the high likelihood of a successful procedure. We discussed the risks of the procedure, including infection, bleeding, tissue injury, clip migration, and inadequate sampling. Informed written consent was given. The usual time-out protocol was performed immediately prior to the procedure. Lesion quadrant: Upper-outer quadrant Using sterile technique and 1% Lidocaine as local anesthetic, under direct ultrasound visualization, a 12 gauge spring-loaded device was used to perform biopsy of a right breast mass using a lateral approach. At the conclusion of the procedure a ribbon shaped tissue marker clip was deployed into the biopsy cavity. Using sterile technique and 1% Lidocaine as local anesthetic, under direct ultrasound visualization, a 14 gauge spring-loaded device was used to perform biopsy of a right axillary lymph node using a lateral approach. At the conclusion of the procedure a HydroMARK tissue marker clip was deployed into the biopsy cavity. Follow up 2 view mammogram was performed and dictated separately. Of note, the patient had significant anxiety during the procedure making additional samples difficult to obtain safely. During real-time scanning, an additional round, circumscribed hypoechoic mass is identified at the 10  o'clock position 8 cm from the nipple. The option for an additional third biopsy was discussed with the patient. However, the decision was made not to perform this biopsy due to the patient's significant anxiety at the time of the procedures. IMPRESSION: 1. Ultrasound guided biopsy of a right breast mass and right axillary lymph node. 2. An additional 6 cm hypoechoic mass is identified at the 10 o'clock position 8 cm from the nipple during real-time scanning. This may represent an intramammary lymph node. Additional biopsy was not performed at this time due to the patient's significant level of anxiety. Electronically Signed: By: Kristopher Oppenheim M.D. On: 02/25/2018 15:45      IMPRESSION/PLAN: Right Breast Cancer   Plan as discussed with patient and multidisciplinary team-  1. MRI breasts 2. Echocardiogram 3. Chemotherapy Class 4. Port-a-Cath 5. Neoadjuvant Chemotherapy 6. Right Lumpectomy with Targeted Axillary Dissection 7. Adjuvant Radiotherapy  It was a pleasure meeting the patient today. We discussed the risks, benefits, and side effects of radiotherapy. I recommend radiotherapy to the right breast to reduce her risk of locoregional recurrence by 2/3.  We discussed that radiation would take approximately 6 weeks  to complete and that I would give the patient a few weeks to heal following surgery before starting treatment planning. We spoke about acute effects including skin irritation and fatigue as well as much less common late effects including internal organ injury or irritation. We spoke about the latest technology that is used to minimize the risk of late effects for patients undergoing radiotherapy to the breast or chest wall. No guarantees of treatment were given. The patient is enthusiastic about proceeding with treatment. I look forward to participating in the patient's care.  I will await her referral back to me for postoperative follow-up and eventual CT simulation/treatment  planning.  The patient was very upset; emotional support was given by me today. She will also see our Education officer, museum today for more emotional support.   __________________________________________   Eppie Gibson, Good  This document serves as a record of services personally performed by Eppie Gibson, Good. It was created on his behalf by Wilburn Mylar, a trained medical scribe. The creation of this record is based on the scribe's personal observations and the provider's statements to them. This document has been checked and approved by the attending provider.
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
## 2018-03-03 NOTE — Progress Notes (Signed)
Nutrition Assessment  Reason for Assessment:  Pt seen in Breast Clinic  ASSESSMENT:   57 year old female with new diagnosis of breast cancer.  Past medical history reviewed.    Patient reports diarrhea recently but thinks it is related to anxiety.  Said very little during visit and seemed frustrated, mad, disgusted.    Medications:  reviewed  Labs: reviewed  Anthropometrics:   Height: 65 inches Weight: 214 lb BMI: 35   NUTRITION DIAGNOSIS: Food and nutrition related knowledge deficit related to new diagnosis of breast cancer as evidenced by no prior need for nutrition related information.  INTERVENTION:   Discussed and provided packet of information regarding nutritional tips for breast cancer patients.   Patient with questions regarding foods to eat with diarrhea.  Encouraged first trying to identify and treat the reason for the diarrhea.  Encouraged follow-up with PCP.  Encouraged keeping food diary to identify trigger foods (lactose, high sugary foods, etc). Patient not open to suggestions. Questions answered.  Teachback method used.  Contact information provided and patient knows to contact me with questions/concerns.    MONITORING, EVALUATION, and GOAL: Pt will consume a healthy plant based diet to maintain lean body mass throughout treatment.   Helen Winterhalter B. Zenia Resides, [place], [place] Registered Dietitian [phone] (pager)

## 2018-03-03 NOTE — Progress Notes (Signed)
START ON PATHWAY REGIMEN - Breast     A cycle is every 21 days:     Pertuzumab      Pertuzumab      Trastuzumab      Trastuzumab      Carboplatin      Docetaxel   **Always confirm dose/schedule in your pharmacy ordering system**  Patient Characteristics: Preoperative or Nonsurgical Candidate (Clinical Staging), Neoadjuvant Therapy followed by Surgery, Invasive Disease, Chemotherapy, HER2 Positive, ER Negative/Unknown Therapeutic Status: Preoperative or Nonsurgical Candidate (Clinical Staging) AJCC M Category: cM0 AJCC Grade: G2 Breast Surgical Plan: Neoadjuvant Therapy followed by Surgery ER Status: Negative (-) AJCC 8 Stage Grouping: IIB HER2 Status: Positive (+) AJCC T Category: cT2 AJCC N Category: cN1 PR Status: Negative (-) Intent of Therapy: Curative Intent, Discussed with Patient

## 2018-03-03 NOTE — Progress Notes (Signed)
Clinical Social Work [place] Psychosocial Distress Screening [place]  Patient completed distress screening protocol and scored a 10 on the Psychosocial Distress Thermometer which indicates severe distress. Clinical Social Worker met with patient and patients family in [facility] to assess for distress and other psychosocial needs. Patient was tearful and stated she was feeling overwhelmed.  CSW validated patients feelings and provided additional support. CSW and patient discussed common feeling and emotions when being diagnosed with cancer, and the importance of support during treatment. CSW informed patient of the support team and support services at [facility]. CSW provided contact information and encouraged patient to call with any questions or concerns.  ONCBCN DISTRESS SCREENING 03/03/2018  Screening Type Initial Screening  Distress experienced in past week (1-10) 10  Emotional problem type Depression;Nervousness/Anxiety;Adjusting to illness  Spiritual/Religous concerns type Relating to God  Physical Problem type Getting around  Physician notified of physical symptoms Yes     Johnnye Lana, MSW, LCSW, OSW-C Clinical Social Worker [place] [phone]

## 2018-03-04 ENCOUNTER — Telehealth: Payer: Self-pay | Admitting: Oncology

## 2018-03-04 NOTE — Telephone Encounter (Signed)
Per 7/24 no los °

## 2018-03-05 ENCOUNTER — Other Ambulatory Visit: Payer: Self-pay | Admitting: *Deleted

## 2018-03-05 DIAGNOSIS — Z171 Estrogen receptor negative status [ER-]: Secondary | ICD-10-CM

## 2018-03-05 DIAGNOSIS — C50411 Malignant neoplasm of upper-outer quadrant of right female breast: Secondary | ICD-10-CM

## 2018-03-08 ENCOUNTER — Encounter: Payer: Self-pay | Admitting: *Deleted

## 2018-03-08 ENCOUNTER — Telehealth: Payer: Self-pay | Admitting: *Deleted

## 2018-03-08 NOTE — Telephone Encounter (Signed)
Left vm regarding [facility] from 7.24.19. Contact information provided for questions and needs.

## 2018-03-10 ENCOUNTER — Telehealth (HOSPITAL_COMMUNITY): Payer: Self-pay | Admitting: *Deleted

## 2018-03-10 NOTE — Telephone Encounter (Signed)
Telephoned patient at home number and advised patient would need to fill out [facility] Medicaid paperwork. Patient to meet on Thursday August 1 2:15.

## 2018-03-11 ENCOUNTER — Ambulatory Visit (HOSPITAL_COMMUNITY)
Admission: RE | Admit: 2018-03-11 | Discharge: 2018-03-11 | Disposition: A | Discharge status: Home / Self Care | Payer: No Typology Code available for payment source | Source: Ambulatory Visit | Attending: Oncology | Admitting: Oncology

## 2018-03-11 DIAGNOSIS — Z171 Estrogen receptor negative status [ER-]: Secondary | ICD-10-CM | POA: Insufficient documentation

## 2018-03-11 DIAGNOSIS — C50411 Malignant neoplasm of upper-outer quadrant of right female breast: Secondary | ICD-10-CM | POA: Insufficient documentation

## 2018-03-11 HISTORY — PX: BREAST BIOPSY: SHX20

## 2018-03-11 MED ORDER — GADOBENATE DIMEGLUMINE 529 MG/ML IV SOLN
20.0000 mL | Freq: Once | INTRAVENOUS | Status: AC | PRN
  Administered 2018-03-11: 19 mL via INTRAVENOUS

## 2018-03-16 ENCOUNTER — Inpatient Hospital Stay: Payer: Medicaid Other | Attending: Oncology

## 2018-03-16 ENCOUNTER — Encounter (HOSPITAL_COMMUNITY)
Admission: RE | Admit: 2018-03-16 | Discharge: 2018-03-16 | Disposition: A | Discharge status: Home / Self Care | Payer: Medicaid Other | Source: Ambulatory Visit | Attending: Oncology | Admitting: Oncology

## 2018-03-16 ENCOUNTER — Other Ambulatory Visit (HOSPITAL_COMMUNITY): Payer: No Typology Code available for payment source

## 2018-03-16 ENCOUNTER — Ambulatory Visit (HOSPITAL_BASED_OUTPATIENT_CLINIC_OR_DEPARTMENT_OTHER)
Admission: RE | Admit: 2018-03-16 | Discharge: 2018-03-16 | Disposition: A | Discharge status: Home / Self Care | Payer: Medicaid Other | Source: Ambulatory Visit | Attending: Oncology | Admitting: Oncology

## 2018-03-16 ENCOUNTER — Encounter (HOSPITAL_COMMUNITY): Payer: Self-pay

## 2018-03-16 ENCOUNTER — Ambulatory Visit (HOSPITAL_COMMUNITY)
Admission: RE | Admit: 2018-03-16 | Discharge: 2018-03-16 | Disposition: A | Discharge status: Home / Self Care | Payer: Medicaid Other | Source: Ambulatory Visit | Attending: Oncology | Admitting: Oncology

## 2018-03-16 ENCOUNTER — Telehealth: Payer: Self-pay | Admitting: *Deleted

## 2018-03-16 DIAGNOSIS — Z5111 Encounter for antineoplastic chemotherapy: Secondary | ICD-10-CM | POA: Insufficient documentation

## 2018-03-16 DIAGNOSIS — C50411 Malignant neoplasm of upper-outer quadrant of right female breast: Secondary | ICD-10-CM

## 2018-03-16 DIAGNOSIS — E860 Dehydration: Secondary | ICD-10-CM | POA: Insufficient documentation

## 2018-03-16 DIAGNOSIS — Z171 Estrogen receptor negative status [ER-]: Secondary | ICD-10-CM | POA: Insufficient documentation

## 2018-03-16 DIAGNOSIS — D3502 Benign neoplasm of left adrenal gland: Secondary | ICD-10-CM | POA: Insufficient documentation

## 2018-03-16 DIAGNOSIS — M791 Myalgia, unspecified site: Secondary | ICD-10-CM | POA: Insufficient documentation

## 2018-03-16 DIAGNOSIS — F17210 Nicotine dependence, cigarettes, uncomplicated: Secondary | ICD-10-CM | POA: Insufficient documentation

## 2018-03-16 DIAGNOSIS — E669 Obesity, unspecified: Secondary | ICD-10-CM | POA: Insufficient documentation

## 2018-03-16 DIAGNOSIS — Z5112 Encounter for antineoplastic immunotherapy: Secondary | ICD-10-CM | POA: Insufficient documentation

## 2018-03-16 DIAGNOSIS — Z6835 Body mass index (BMI) 35.0-35.9, adult: Secondary | ICD-10-CM | POA: Insufficient documentation

## 2018-03-16 DIAGNOSIS — E279 Disorder of adrenal gland, unspecified: Secondary | ICD-10-CM | POA: Insufficient documentation

## 2018-03-16 DIAGNOSIS — R197 Diarrhea, unspecified: Secondary | ICD-10-CM | POA: Insufficient documentation

## 2018-03-16 DIAGNOSIS — K760 Fatty (change of) liver, not elsewhere classified: Secondary | ICD-10-CM | POA: Insufficient documentation

## 2018-03-16 DIAGNOSIS — R6889 Other general symptoms and signs: Secondary | ICD-10-CM | POA: Insufficient documentation

## 2018-03-16 DIAGNOSIS — T451X5A Adverse effect of antineoplastic and immunosuppressive drugs, initial encounter: Secondary | ICD-10-CM | POA: Insufficient documentation

## 2018-03-16 DIAGNOSIS — C773 Secondary and unspecified malignant neoplasm of axilla and upper limb lymph nodes: Secondary | ICD-10-CM | POA: Insufficient documentation

## 2018-03-16 DIAGNOSIS — R110 Nausea: Secondary | ICD-10-CM | POA: Insufficient documentation

## 2018-03-16 MED ORDER — TECHNETIUM TC 99M MEDRONATE IV KIT: 20.0000 | PACK | Freq: Once | INTRAVENOUS | Status: DC | PRN

## 2018-03-16 MED ORDER — IOHEXOL 300 MG/ML  SOLN
75.0000 mL | Freq: Once | INTRAMUSCULAR | Status: AC | PRN
  Administered 2018-03-16: 75 mL via INTRAVENOUS

## 2018-03-16 NOTE — Telephone Encounter (Signed)
Called pt since she did not show for Education Class.  Pt reports that she had a mother with her all day & she was exhausted & would like to r/s this appt.  Message left with scheduler to r/s pt.

## 2018-03-16 NOTE — Progress Notes (Signed)
  Echocardiogram 2D Echocardiogram has been performed.  Jacqueline Good 03/16/2018, 11:44 AM

## 2018-03-17 ENCOUNTER — Other Ambulatory Visit: Payer: Self-pay | Admitting: Radiology

## 2018-03-18 ENCOUNTER — Ambulatory Visit (HOSPITAL_COMMUNITY)
Admission: RE | Admit: 2018-03-18 | Discharge: 2018-03-18 | Disposition: A | Discharge status: Home / Self Care | Payer: Medicaid Other | Source: Ambulatory Visit | Attending: Adult Health | Admitting: Adult Health

## 2018-03-18 ENCOUNTER — Encounter (HOSPITAL_COMMUNITY): Payer: Self-pay

## 2018-03-18 ENCOUNTER — Other Ambulatory Visit: Payer: Self-pay | Admitting: Adult Health

## 2018-03-18 ENCOUNTER — Inpatient Hospital Stay (HOSPITAL_BASED_OUTPATIENT_CLINIC_OR_DEPARTMENT_OTHER): Payer: Medicaid Other | Admitting: Oncology

## 2018-03-18 DIAGNOSIS — J45909 Unspecified asthma, uncomplicated: Secondary | ICD-10-CM | POA: Diagnosis not present

## 2018-03-18 DIAGNOSIS — Z171 Estrogen receptor negative status [ER-]: Secondary | ICD-10-CM

## 2018-03-18 DIAGNOSIS — R110 Nausea: Secondary | ICD-10-CM | POA: Diagnosis not present

## 2018-03-18 DIAGNOSIS — C773 Secondary and unspecified malignant neoplasm of axilla and upper limb lymph nodes: Secondary | ICD-10-CM | POA: Diagnosis not present

## 2018-03-18 DIAGNOSIS — F329 Major depressive disorder, single episode, unspecified: Secondary | ICD-10-CM | POA: Insufficient documentation

## 2018-03-18 DIAGNOSIS — E860 Dehydration: Secondary | ICD-10-CM | POA: Diagnosis not present

## 2018-03-18 DIAGNOSIS — Z7951 Long term (current) use of inhaled steroids: Secondary | ICD-10-CM | POA: Insufficient documentation

## 2018-03-18 DIAGNOSIS — Z5111 Encounter for antineoplastic chemotherapy: Secondary | ICD-10-CM | POA: Diagnosis present

## 2018-03-18 DIAGNOSIS — T451X5A Adverse effect of antineoplastic and immunosuppressive drugs, initial encounter: Secondary | ICD-10-CM | POA: Diagnosis not present

## 2018-03-18 DIAGNOSIS — F17210 Nicotine dependence, cigarettes, uncomplicated: Secondary | ICD-10-CM

## 2018-03-18 DIAGNOSIS — R197 Diarrhea, unspecified: Secondary | ICD-10-CM | POA: Diagnosis not present

## 2018-03-18 DIAGNOSIS — C50411 Malignant neoplasm of upper-outer quadrant of right female breast: Secondary | ICD-10-CM

## 2018-03-18 DIAGNOSIS — R6889 Other general symptoms and signs: Secondary | ICD-10-CM | POA: Diagnosis not present

## 2018-03-18 DIAGNOSIS — Z5112 Encounter for antineoplastic immunotherapy: Secondary | ICD-10-CM | POA: Diagnosis present

## 2018-03-18 DIAGNOSIS — M791 Myalgia, unspecified site: Secondary | ICD-10-CM | POA: Diagnosis not present

## 2018-03-18 DIAGNOSIS — D3502 Benign neoplasm of left adrenal gland: Secondary | ICD-10-CM | POA: Diagnosis not present

## 2018-03-18 DIAGNOSIS — Z91041 Radiographic dye allergy status: Secondary | ICD-10-CM | POA: Diagnosis not present

## 2018-03-18 DIAGNOSIS — Z803 Family history of malignant neoplasm of breast: Secondary | ICD-10-CM

## 2018-03-18 HISTORY — PX: IR IMAGING GUIDED PORT INSERTION: IMG5740

## 2018-03-18 LAB — CBC WITH DIFFERENTIAL/PLATELET
BASOS ABS: 0 10*3/uL (ref 0.0–0.1)
BASOS PCT: 0 %
EOS ABS: 0.2 10*3/uL (ref 0.0–0.7)
EOS PCT: 2 %
HCT: 41.3 % (ref 36.0–46.0)
Hemoglobin: 13.6 g/dL (ref 12.0–15.0)
LYMPHS PCT: 19 %
Lymphs Abs: 1.5 10*3/uL (ref 0.7–4.0)
MCH: 29.4 pg (ref 26.0–34.0)
MCHC: 32.9 g/dL (ref 30.0–36.0)
MCV: 89.2 fL (ref 78.0–100.0)
Monocytes Absolute: 0.4 10*3/uL (ref 0.1–1.0)
Monocytes Relative: 5 %
Neutro Abs: 5.8 10*3/uL (ref 1.7–7.7)
Neutrophils Relative %: 74 %
PLATELETS: 256 10*3/uL (ref 150–400)
RBC: 4.63 MIL/uL (ref 3.87–5.11)
RDW: 13.7 % (ref 11.5–15.5)
WBC: 7.8 10*3/uL (ref 4.0–10.5)

## 2018-03-18 LAB — PROTIME-INR
INR: 0.94
PROTHROMBIN TIME: 12.5 s (ref 11.4–15.2)

## 2018-03-18 MED ORDER — MIDAZOLAM HCL 2 MG/2ML IJ SOLN
INTRAMUSCULAR | Status: AC
  Filled 2018-03-18: qty 2

## 2018-03-18 MED ORDER — CEFAZOLIN SODIUM-DEXTROSE 2-4 GM/100ML-% IV SOLN
2.0000 g | INTRAVENOUS | Status: AC
  Administered 2018-03-18: 2 g via INTRAVENOUS

## 2018-03-18 MED ORDER — MIDAZOLAM HCL 2 MG/2ML IJ SOLN
INTRAMUSCULAR | Status: AC | PRN
  Administered 2018-03-18 (×4): 1 mg via INTRAVENOUS

## 2018-03-18 MED ORDER — FENTANYL CITRATE (PF) 100 MCG/2ML IJ SOLN
INTRAMUSCULAR | Status: AC | PRN
  Administered 2018-03-18 (×3): 50 ug via INTRAVENOUS

## 2018-03-18 MED ORDER — HEPARIN SOD (PORK) LOCK FLUSH 100 UNIT/ML IV SOLN
INTRAVENOUS | Status: AC
  Filled 2018-03-18: qty 5

## 2018-03-18 MED ORDER — SODIUM CHLORIDE 0.9 % IV SOLN
INTRAVENOUS | Status: DC
  Administered 2018-03-18: 13:00:00 via INTRAVENOUS

## 2018-03-18 MED ORDER — FENTANYL CITRATE (PF) 100 MCG/2ML IJ SOLN
INTRAMUSCULAR | Status: AC
  Filled 2018-03-18: qty 2

## 2018-03-18 MED ORDER — LIDOCAINE HCL 1 % IJ SOLN
INTRAMUSCULAR | Status: AC | PRN
  Administered 2018-03-18: 20 mL

## 2018-03-18 MED ORDER — HEPARIN SOD (PORK) LOCK FLUSH 100 UNIT/ML IV SOLN
INTRAVENOUS | Status: AC | PRN
  Administered 2018-03-18: 500 [IU] via INTRAVENOUS

## 2018-03-18 MED ORDER — CEFAZOLIN SODIUM-DEXTROSE 2-4 GM/100ML-% IV SOLN
INTRAVENOUS | Status: AC
  Filled 2018-03-18: qty 100

## 2018-03-18 MED ORDER — LIDOCAINE HCL 1 % IJ SOLN
INTRAMUSCULAR | Status: AC
  Filled 2018-03-18: qty 20

## 2018-03-18 NOTE — H&P (Signed)
Chief Complaint: Patient was seen in consultation today for Port-A-Cath placement  Referring Physician(s): Causey,Lindsey Cornetto/[name],G  Supervising Physician: Aletta Edouard  Patient Status: [facility] - Out-pt  History of Present Illness: Jacqueline Good is a 57 y.o. female smoker with history of recently diagnosed right breast carcinoma who presents today for Port-A-Cath placement for chemotherapy.  Past Medical History:  Diagnosis Date  . Arthritis   . Asthma   . breast ca dx'd 02/2018   right  . Depression     Past Surgical History:  Procedure Laterality Date  . endometrial ablation    . MYOMECTOMY      Allergies: Gadolinium derivatives  Medications: Prior to Admission medications   Medication Sig Start Date End Date Taking? Authorizing Provider  albuterol (VENTOLIN HFA) 108 (90 Base) MCG/ACT inhaler INHALE 2 PUFFS INTO THE LUNGS EVERY 6 HOURS AS NEEDED FOR WHEEZING 07/09/17  Yes Jegede, Marlena Clipper, MD  ALPRAZolam (XANAX) 0.5 MG tablet Take 1 tablet (0.5 mg total) by mouth 2 (two) times daily as needed for anxiety. 09/24/16  Yes Jegede, Marlena Clipper, MD  BusPIRone HCl (BUSPAR PO) Take by mouth.   Yes [provider]  cetirizine-pseudoephedrine (ZYRTEC-D) 5-120 MG tablet Take 1 tablet by mouth daily. 07/05/17  Yes Yu, Amy V, PA-C  DULoxetine HCl (CYMBALTA PO) Take by mouth.   Yes [provider]  esomeprazole (NEXIUM) 40 MG capsule Take 1 capsule (40 mg total) by mouth daily. 09/24/16  Yes Tresa Garter, MD  fluticasone (FLONASE) 50 MCG/ACT nasal spray Place 2 sprays into both nostrils daily. 07/05/17  Yes Yu, Amy V, PA-C  Melatonin 5 MG TABS Take 1 tablet by mouth at bedtime.   Yes [provider]  naproxen (NAPROSYN) 500 MG tablet Take 1 tablet (500 mg total) by mouth 2 (two) times daily with a meal. 06/03/17  Yes Jegede, Olugbemiga E, MD  dexamethasone (DECADRON) 4 MG tablet Take 2 tablets (8 mg total) by mouth 2 (two) times  daily. Start the day before Taxotere. Take once the day after, then 2 times a day x 2d. 03/03/18   [name], [name], MD  Fluticasone-Salmeterol (ADVAIR DISKUS) 250-50 MCG/DOSE AEPB INHALE 1 PUFF INTO THE LUNGS 2 TIMES DAILY. Patient not taking: Reported on 03/03/2018 08/05/17   Tresa Garter, MD  lidocaine-prilocaine (EMLA) cream Apply to affected area once 03/03/18   [name], [name], MD  LORazepam (ATIVAN) 0.5 MG tablet Take 1 tablet (0.5 mg total) by mouth at bedtime as needed (Nausea or vomiting). 03/03/18   [name], [name], MD  prochlorperazine (COMPAZINE) 10 MG tablet Take 1 tablet (10 mg total) by mouth every 6 (six) hours as needed (Nausea or vomiting). 03/03/18   [name], [name], MD     Family History  Problem Relation Age of Onset  . Lupus Sister   . Heart disease Sister   . Breast cancer Mother     Social History   Socioeconomic History  . Marital status: Single    Spouse name: Not on file  . Number of children: Not on file  . Years of education: Not on file  . Highest education level: Not on file  Occupational History  . Not on file  Social Needs  . Financial resource strain: Not on file  . Food insecurity:    Worry: Not on file    Inability: Not on file  . Transportation needs:    Medical: Not on file    Non-medical: Not on file  Tobacco Use  . Smoking status: Current Every Day Smoker    Packs/day: 1.00    Years: 20.00    Pack years: 20.00    Types: Cigarettes  . Smokeless tobacco: Never Used  Substance and Sexual Activity  . Alcohol use: Yes    Comment: occasionally  . Drug use: No  . Sexual activity: Not Currently    Birth control/protection: None  Lifestyle  . Physical activity:    Days per week: Not on file    Minutes per session: Not on file  . Stress: Not on file  Relationships  . Social connections:    Talks on phone: Not on file    Gets together: Not on file    Attends religious service: Not on file    Active member of club or  organization: Not on file    Attends meetings of clubs or organizations: Not on file    Relationship status: Not on file  Other Topics Concern  . Not on file  Social History Narrative  . Not on file      Review of Systems denies fever, headache chest pain, dyspnea, abdominal/back pain, nausea, vomiting or bleeding.  She does have occasional cough.  Vital Signs: BP 115/83 (BP Location: Right Arm)   Pulse (!) 55   Temp 97.8 F (36.6 C) (Oral)   Resp 18   Ht 5' 5"  (1.651 m)   Wt 212 lb 9.6 oz (96.4 kg)   SpO2 96%   BMI 35.38 kg/m   Physical Exam awake, alert.  Chest clear to auscultation bilaterally.  Heart with bradycardic but regular rhythm.  Abdomen obese, soft, positive bowel sounds, nontender.  No lower extremity edema.  Imaging: Ct Chest W Contrast  Result Date: 03/16/2018 CLINICAL DATA:  New diagnosis of invasive right breast cancer. EXAM: CT CHEST WITH CONTRAST TECHNIQUE: Multidetector CT imaging of the chest was performed during intravenous contrast administration. CONTRAST:  44m OMNIPAQUE IOHEXOL 300 MG/ML  SOLN COMPARISON:  Radiographs 11/15/2009. FINDINGS: Cardiovascular: No significant vascular findings. There is limited opacification of the pulmonary arteries. Probable faint coronary artery calcifications. The heart size is normal. There is no pericardial effusion. Mediastinum/Nodes: There are no enlarged mediastinal, hilar, axillary or internal mammary lymph nodes. The thyroid gland and trachea appear normal. There is a small hiatal hernia. Lungs/Pleura: There is no pleural effusion. The lungs are clear. Upper abdomen: Subjective mild hepatic steatosis without focal abnormality. There is a nonspecific 2.9 x 2.4 cm left adrenal nodule measuring 32 HU. Musculoskeletal/Chest wall: Ill-defined right breast mass noted with associated central biopsy clip. No suspicious osseous findings. IMPRESSION: 1. No evidence of thoracic metastatic disease. 2. Indeterminate left adrenal  nodule. Consider further characterization with noncontrast CT through the adrenal glands. 3. Subjective mild hepatic steatosis. 4. Known right breast mass, biopsy proven malignancy. Electronically Signed   By: WRichardean SaleM.D.   On: 03/16/2018 17:27   Nm Bone Scan Whole Body  Result Date: 03/16/2018 CLINICAL DATA:  New diagnosis of invasive right breast cancer. EXAM: NUCLEAR MEDICINE WHOLE BODY BONE SCAN TECHNIQUE: Whole body anterior and posterior images were obtained approximately 3 hours after intravenous injection of radiopharmaceutical. RADIOPHARMACEUTICALS:  20.0 mCi Technetium-96mDP IV COMPARISON:  Chest CT same date. FINDINGS: There is no osseous uptake suspicious for metastatic disease. There is mild generalized calvarial uptake consistent with hyperostosis. There is multifocal large joint activity involving the elbows, wrists, knees and feet consistent with arthropathy. The soft tissue activity appears unremarkable aside from mild urine  contamination in the perineum. IMPRESSION: No evidence of osseous metastatic disease. Electronically Signed   By: Richardean Sale M.D.   On: 03/16/2018 17:49   Mr Breast Bilateral W [place] Cad  Result Date: 03/12/2018 CLINICAL DATA:  Ultrasound-guided core biopsy of mass the 10 o'clock location of the RIGHT breast revealed invasive ductal carcinoma in a background of high-grade ductal carcinoma in situ. Ultrasound-guided core biopsy of an enlarged RIGHT axillary lymph node demonstrated metastatic breast carcinoma. LABS:  None EXAM: BILATERAL BREAST MRI WITH AND WITHOUT CONTRAST TECHNIQUE: Multiplanar, multisequence MR images of both breasts were obtained prior to and following the intravenous administration of 19 ml of MultiHance. Following the contrast injection, patient experienced sneezing and coughing due to contrast allergy. THREE-DIMENSIONAL MR IMAGE RENDERING ON INDEPENDENT WORKSTATION: Three-dimensional MR images were rendered by post-processing  of the original MR data on an independent workstation. The three-dimensional MR images were interpreted, and findings are reported in the following complete MRI report for this study. Three dimensional images were evaluated at the independent DynaCad workstation COMPARISON:  02/25/2018 and earlier FINDINGS: Breast composition: c. Heterogeneous fibroglandular tissue. Background parenchymal enhancement: Moderate. Right breast: Within the LATERAL aspect of the RIGHT breast there is an enhancing mass measuring 2.4 x 2.4x 2.4 centimeters. Mass demonstrates plateau type enhancement kinetics and contains a clip from recent ultrasound-guided core biopsy. There is non mass enhancement surrounding this lesion, estimated to be at least a total of 8.9 (anterior posterior) x 5.4 (superior to inferior) centimeters. This enhancement extends approximately 2.4 centimeters anterior to the mass and 4.6 centimeters posterior to the mass. Left breast: No mass or abnormal enhancement. Lymph nodes: Patient had an ultrasound-guided core biopsy of an enlarged RIGHT axillary lymph node. In addition to this abnormal lymph node, there are at least 5 small rounded lymph nodes including intramammary lymph nodes and LOWER axillary lymph nodes which are suspicious based on their morphology. The evaluation of the RIGHT axilla is limited due to significant motion on STIR sequences. No suspicious internal mammary lymph nodes. LEFT axilla is negative. Ancillary findings:  None. IMPRESSION: 1. In addition to the known cancer in the LATERAL portion of the RIGHT breast, there is additional non mass enhancement extending anterior and posterior to this mass, suspicious for additional DCIS, measuring a total of 8.9 centimeters in anterior-posterior extent. 2. Additional enlarged RIGHT axillary and intramammary lymph nodes. 3. LEFT breast is negative. RECOMMENDATION: If the patient is a candidate for breast conservation, additional biopsies of the RIGHT  breast are indicated. The patient has a possible allergy to MultiHance gadolinium contrast agent and will need 13 hour premedication prior to MR guided core biopsies. BI-RADS CATEGORY  4: Suspicious. Electronically Signed   By: Nolon Nations M.D.   On: 03/12/2018 15:56   [place] Axillary Node Core Biopsy Right  Addendum Date: 03/01/2018   ADDENDUM REPORT: 03/01/2018 07:49 ADDENDUM: Pathology revealed MICROSCOPIC FOCUS OF INVASIVE DUCTAL CARCINOMA ARISING IN A BACKGROUND OF HIGH GRADE DUCTAL CARCINOMA IN SITU of the Right breast, outer upper quadrant. METASTATIC BREAST CARCINOMA TO the Right axillary lymph node. This was found to be concordant by Dr. Kristopher Oppenheim. Pathology results were discussed with the patient by telephone. The patient reported doing well after the biopsies with tenderness at the sites. Post biopsy instructions and care were reviewed and questions were answered. The patient was encouraged to call [facility] for any additional concerns. The patient was referred to [facility] on March 03, 2018. Recommendation for a bilateral breast MRI for further evaluation of extent of disease and heterogeneously dense breasts. Additionally, a mass in the upper outer quadrant of the right breast at posterior depth is suspicious for an enlarging intramammary lymph node. An ultrasound correlate was identified during real-time scanning at the time of the patient's biopsy. Additional ultrasound guided core biopsy was discussed at the time, but could not be safely performed due to the patient's significant anxiety. Pending breast MRI results, if breast conservation therapy is a consideration, additional localization prior to surgery or additional attempted biopsy/clip placement prior to neoadjuvant chemotherapy is recommended. Pathology results reported by Terie Purser, RN on 03/01/2018. Electronically Signed    By: Kristopher Oppenheim M.D.   On: 03/01/2018 07:49   Result Date: 03/01/2018 CLINICAL DATA:  57 year old female with a suspicious right breast mass and right axillary lymph node. EXAM: ULTRASOUND GUIDED RIGHT BREAST CORE NEEDLE BIOPSY COMPARISON:  Previous exam(s). FINDINGS: I met with the patient and we discussed the procedure of ultrasound-guided biopsy, including benefits and alternatives. We discussed the high likelihood of a successful procedure. We discussed the risks of the procedure, including infection, bleeding, tissue injury, clip migration, and inadequate sampling. Informed written consent was given. The usual time-out protocol was performed immediately prior to the procedure. Lesion quadrant: Upper-outer quadrant Using sterile technique and 1% Lidocaine as local anesthetic, under direct ultrasound visualization, a 12 gauge spring-loaded device was used to perform biopsy of a right breast mass using a lateral approach. At the conclusion of the procedure a ribbon shaped tissue marker clip was deployed into the biopsy cavity. Using sterile technique and 1% Lidocaine as local anesthetic, under direct ultrasound visualization, a 14 gauge spring-loaded device was used to perform biopsy of a right axillary lymph node using a lateral approach. At the conclusion of the procedure a HydroMARK tissue marker clip was deployed into the biopsy cavity. Follow up 2 view mammogram was performed and dictated separately. Of note, the patient had significant anxiety during the procedure making additional samples difficult to obtain safely. During real-time scanning, an additional round, circumscribed hypoechoic mass is identified at the 10 o'clock position 8 cm from the nipple. The option for an additional third biopsy was discussed with the patient. However, the decision was made not to perform this biopsy due to the patient's significant anxiety at the time of the procedures. IMPRESSION: 1. Ultrasound guided biopsy of a  right breast mass and right axillary lymph node. 2. An additional 6 cm hypoechoic mass is identified at the 10 o'clock position 8 cm from the nipple during real-time scanning. This may represent an intramammary lymph node. Additional biopsy was not performed at this time due to the patient's significant level of anxiety. Electronically Signed: By: Kristopher Oppenheim M.D. On: 02/25/2018 15:45   Mm Clip Placement Right  Result Date: 02/25/2018 CLINICAL DATA:  57 year old female status post 2 area biopsy of the right breast and axilla. EXAM: DIAGNOSTIC RIGHT MAMMOGRAM POST ULTRASOUND BIOPSY COMPARISON:  Previous exam(s). FINDINGS: Mammographic images were obtained following ultrasound-guided biopsy of the right breast. Ribbon shaped clip is identified in the upper outer quadrant of the right breast at middle depth in relation to the previously identified spiculated mass. A HydroMARK clip is identified in the right axilla. A 6 mm circumscribed masses identified in the upper outer quadrant at posterior depth, likely relating to the additional sonographically identified mass. IMPRESSION: 1. Post biopsy clips in the expected  location of the right breast and right axilla. 2. Additional 6 mm circumscribed mass in the upper outer quadrant posterior depth likely correlates with the sonographically identified mass. Findings are suggestive of an intramammary lymph node. Additional localization of this area is recommended pending pathology results. The patient had difficulty tolerating the biopsies due to significant anxiety. Final Assessment: Post Procedure Mammograms for Marker Placement Electronically Signed   By: Kristopher Oppenheim M.D.   On: 02/25/2018 15:48   [place] Rt Breast Bx W Loc Dev 1st Lesion Img Bx Spec US Guide  Addendum Date: 03/01/2018   ADDENDUM REPORT: 03/01/2018 07:49 ADDENDUM: Pathology revealed MICROSCOPIC FOCUS OF INVASIVE DUCTAL CARCINOMA ARISING IN A BACKGROUND OF HIGH GRADE DUCTAL CARCINOMA IN SITU of the  Right breast, outer upper quadrant. METASTATIC BREAST CARCINOMA TO the Right axillary lymph node. This was found to be concordant by Dr. Kristopher Oppenheim. Pathology results were discussed with the patient by telephone. The patient reported doing well after the biopsies with tenderness at the sites. Post biopsy instructions and care were reviewed and questions were answered. The patient was encouraged to call [facility] for any additional concerns. The patient was referred to [facility] on March 03, 2018. Recommendation for a bilateral breast MRI for further evaluation of extent of disease and heterogeneously dense breasts. Additionally, a mass in the upper outer quadrant of the right breast at posterior depth is suspicious for an enlarging intramammary lymph node. An ultrasound correlate was identified during real-time scanning at the time of the patient's biopsy. Additional ultrasound guided core biopsy was discussed at the time, but could not be safely performed due to the patient's significant anxiety. Pending breast MRI results, if breast conservation therapy is a consideration, additional localization prior to surgery or additional attempted biopsy/clip placement prior to neoadjuvant chemotherapy is recommended. Pathology results reported by Terie Purser, RN on 03/01/2018. Electronically Signed   By: Kristopher Oppenheim M.D.   On: 03/01/2018 07:49   Result Date: 03/01/2018 CLINICAL DATA:  57 year old female with a suspicious right breast mass and right axillary lymph node. EXAM: ULTRASOUND GUIDED RIGHT BREAST CORE NEEDLE BIOPSY COMPARISON:  Previous exam(s). FINDINGS: I met with the patient and we discussed the procedure of ultrasound-guided biopsy, including benefits and alternatives. We discussed the high likelihood of a successful procedure. We discussed the risks of the procedure, including infection, bleeding,  tissue injury, clip migration, and inadequate sampling. Informed written consent was given. The usual time-out protocol was performed immediately prior to the procedure. Lesion quadrant: Upper-outer quadrant Using sterile technique and 1% Lidocaine as local anesthetic, under direct ultrasound visualization, a 12 gauge spring-loaded device was used to perform biopsy of a right breast mass using a lateral approach. At the conclusion of the procedure a ribbon shaped tissue marker clip was deployed into the biopsy cavity. Using sterile technique and 1% Lidocaine as local anesthetic, under direct ultrasound visualization, a 14 gauge spring-loaded device was used to perform biopsy of a right axillary lymph node using a lateral approach. At the conclusion of the procedure a HydroMARK tissue marker clip was deployed into the biopsy cavity. Follow up 2 view mammogram was performed and dictated separately. Of note, the patient had significant anxiety during the procedure making additional samples difficult to obtain safely. During real-time scanning, an additional round, circumscribed hypoechoic mass is identified at the 10 o'clock position 8 cm from the nipple. The option for an additional  third biopsy was discussed with the patient. However, the decision was made not to perform this biopsy due to the patient's significant anxiety at the time of the procedures. IMPRESSION: 1. Ultrasound guided biopsy of a right breast mass and right axillary lymph node. 2. An additional 6 cm hypoechoic mass is identified at the 10 o'clock position 8 cm from the nipple during real-time scanning. This may represent an intramammary lymph node. Additional biopsy was not performed at this time due to the patient's significant level of anxiety. Electronically Signed: By: Kristopher Oppenheim M.D. On: 02/25/2018 15:45    Labs:  CBC: Recent Labs    03/03/18 0830 03/18/18 1300  WBC 7.4 7.8  HGB 13.8 13.6  HCT 41.3 41.3  PLT 202 256     COAGS: Recent Labs    03/18/18 1300  INR 0.94    BMP: Recent Labs    03/03/18 0830  NA 141  K 3.9  CL 107  CO2 25  GLUCOSE 96  BUN 21*  CALCIUM 9.4  CREATININE 0.80  GFRNONAA >60  GFRAA >60    LIVER FUNCTION TESTS: Recent Labs    03/03/18 0830  BILITOT 0.5  AST 15  ALT 16  ALKPHOS 99  PROT 7.3  ALBUMIN 4.0    TUMOR MARKERS: No results for input(s): AFPTM, CEA, CA199, CHROMGRNA in the last 8760 hours.  Assessment and Plan: 57 y.o. female smoker with history of recently diagnosed right breast carcinoma who presents today for Port-A-Cath placement for chemotherapy.Risks and benefits of image guided port-a-catheter placement was discussed with the patient/family including, but not limited to bleeding, infection, pneumothorax, or fibrin sheath development and need for additional procedures.  All of the patient's questions were answered, patient is agreeable to proceed. Consent signed and in chart.     Thank you for this interesting consult.  I greatly enjoyed meeting Jacqueline Good and look forward to participating in their care.  A copy of this report was sent to the requesting provider on this date.  Electronically Signed: D. Rowe Robert, PA-C 03/18/2018, 1:32 PM   I spent a total of 25 minutes    in face to face in clinical consultation, greater than 50% of which was counseling/coordinating care for port a cath placement

## 2018-03-18 NOTE — Progress Notes (Addendum)
Jacqueline Good  [phone] [phone]     ID: Jacqueline Good DOB: [birth year]  MR#: [id number]  NTI#:[id number]  Patient Care Team: Tresa Garter, MD as PCP - General (Internal Medicine) Alphonsa Overall, MD as Consulting Physician (General Surgery) [name], [name], MD as Consulting Physician (Oncology) Eppie Gibson, MD as Attending Physician (Radiation Oncology) OTHER MD: Donnal Moat, PA-C [FAX [phone]]  CHIEF COMPLAINT: Estrogen receptor negative breast cancer  CURRENT TREATMENT: Neoadjuvant chemoimmunotherapy  INTERVAL HISTORY: Jacqueline Good presents to the office today for follow up of her estrogen receptor negative but HER-2 positive breast cancer. She is accompanied by her mother today.   She was scheduled to be seen tomorrow, but that she is getting her port put in this afternoon she move the appointment up and we were able to work Arin.  Since her last visit to the office, she underwent MR bilateral breast on 03/11/2018 with results showing: In addition to the known cancer in the LATERAL portion of the RIGHT breast, there is additional non mass enhancement extending anterior and posterior to this mass, suspicious for additional DCIS, measuring a total of 8.9 centimeters in anterior-posterior extent. Additional enlarged RIGHT axillary and intramammary lymph nodes. LEFT breast is negative..   She also had a CT chest w contrast completed on 03/16/2018 with results of: No evidence of thoracic metastatic disease. Indeterminate left adrenal nodule. Subjective mild hepatic steatosis. Known right breast mass, biopsy proven malignancy.  She had a whole bone scan on 03/16/2018 with results showing: No evidence of osseous metastatic disease.   She had an echocardiogram completed on 03/16/2018 which showed left ventricular ejection fraction of 55%-60%.   She is scheduled for port placement today and to start her chemotherapy 03/23/2018.  REVIEW OF  SYSTEMS: Jacqueline Good denies unusual headaches, visual changes, nausea, vomiting, or dizziness. There has been no unusual cough, phlegm production, or pleurisy. This been no change in bowel or bladder habits. She denies unexplained fatigue or unexplained weight loss, bleeding, rash, or fever. A detailed review of systems was otherwise stable.    HISTORY OF CURRENT ILLNESS: From the original intake note:  Jacqueline Good had routine screening mammography on 02/05/2018 showing a possible abnormality in the right breast. She underwent unilateral right diagnostic mammography with tomography and right breast ultrasonography at [facility] on 02/15/2018 showing: Highly suspicious right breast mass at 10 o'clock position.  There was a second, 0.6 cm lesion at the 10:00 radiant which has not been biopsied.  Suspicious focal cortical thickening of a single right axillary lymph node.  Accordingly on 02/25/2018 she proceeded to biopsy of the right breast area in question. The pathology from this procedure showed ([id number]): Breast, right, needle core biopsy, OUQ with microscopic focus of invasive ductal carcinoma, grade 2, arising in a background of high grade ductal carcinoma in situ. 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   The patient's subsequent history is as detailed below.    PAST MEDICAL HISTORY: Past Medical History:  Diagnosis Date  . Arthritis   . Asthma   . breast ca dx'd 02/2018   right  . Depression     PAST SURGICAL HISTORY: Past Surgical History:  Procedure Laterality Date  . endometrial ablation    . MYOMECTOMY      FAMILY HISTORY Family History  Problem Relation Age of Onset  . Lupus Sister   . Heart disease Sister   . Breast  cancer Mother    She notes that her father is currently 11 years old. Patients' mother is currently 34 years old and she was diagnosed with breast cancer at age 27. The patient has 4  brothers and 1 sister. She has a maternal aunt with breast cancer and her maternal great grandmother with had ovarian cancer.    GYNECOLOGIC HISTORY:  No LMP recorded. Patient is postmenopausal. Menarche: 57 years old Age at first live birth: 57 years old [place] P1 LMP: 13-14 years ago s/p ablation Contraceptive: no HRT: no  Hysterectomy: no SO: no  SOCIAL HISTORY: She is currently unemployed.  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 She lives alone without pets. Her daughter is Jacqueline Good who lives in [place] and works in Therapist, art.  The patient has one grandchild. She goes to a [employer].      ADVANCED DIRECTIVES: Not in place.  At the 03/03/2018 visit the patient was given the appropriate documents to complete and notarized at her discretion   HEALTH MAINTENANCE: Social History   Tobacco Use  . Smoking status: Current Every Day Smoker    Packs/day: 1.00    Years: 20.00    Pack years: 20.00    Types: Cigarettes  . Smokeless tobacco: Never Used  Substance Use Topics  . Alcohol use: Yes    Comment: occasionally  . Drug use: No     Colonoscopy: Never  PAP:   Bone density: Never   Allergies  Allergen Reactions  . Gadolinium Derivatives Itching and Cough    Pt began sneezing and coughing as soon as MRI contrast was injected. Severe nasal congestion. No rash or hives no SOB.     Current Outpatient Medications  Medication Sig Dispense Refill  . albuterol (VENTOLIN HFA) 108 (90 Base) MCG/ACT inhaler INHALE 2 PUFFS INTO THE LUNGS EVERY 6 HOURS AS NEEDED FOR WHEEZING 54 g 3  . ALPRAZolam (XANAX) 0.5 MG tablet Take 1 tablet (0.5 mg total) by mouth 2 (two) times daily as needed for anxiety. 60 tablet 0  . BusPIRone HCl (BUSPAR PO) Take by mouth.    . cetirizine-pseudoephedrine (ZYRTEC-D) 5-120 MG tablet Take 1 tablet by mouth daily. (Patient not taking: Reported on 02/04/2018) 15 tablet 0  . dexamethasone (DECADRON) 4 MG tablet Take 2  tablets (8 mg total) by mouth 2 (two) times daily. Start the day before Taxotere. Take once the day after, then 2 times a day x 2d. 30 tablet 1  . DULoxetine HCl (CYMBALTA PO) Take by mouth.    . esomeprazole (NEXIUM) 40 MG capsule Take 1 capsule (40 mg total) by mouth daily. (Patient not taking: Reported on 03/03/2018) 30 capsule 5  . fluticasone (FLONASE) 50 MCG/ACT nasal spray Place 2 sprays into both nostrils daily. (Patient not taking: Reported on 03/03/2018) 1 g 0  . Fluticasone-Salmeterol (ADVAIR DISKUS) 250-50 MCG/DOSE AEPB INHALE 1 PUFF INTO THE LUNGS 2 TIMES DAILY. (Patient not taking: Reported on 03/03/2018) 60 each 2  . lidocaine-prilocaine (EMLA) cream Apply to affected area once 30 g 3  . LORazepam (ATIVAN) 0.5 MG tablet Take 1 tablet (0.5 mg total) by mouth at bedtime as needed (Nausea or vomiting). 30 tablet 0  . Melatonin 5 MG TABS Take 1 tablet by mouth at bedtime.    . naproxen (NAPROSYN) 500 MG tablet Take 1 tablet (500 mg total) by mouth 2 (two) times daily with a meal. 60 tablet 3  . prochlorperazine (COMPAZINE) 10 MG tablet Take 1 tablet (  10 mg total) by mouth every 6 (six) hours as needed (Nausea or vomiting). 30 tablet 1   No current facility-administered medications for this visit.    Facility-Administered Medications Ordered in Other Visits  Medication Dose Route Frequency Provider Last Rate Last Dose  . technetium medronate (TC-MDP) injection 20 millicurie  20 millicurie Intravenous Once PRN Genia Del, MD        OBJECTIVE: Middle-aged African-American woman who appears stated age  69:   03/18/18 1154  BP: 119/66  Pulse: 65  Resp: 18  Temp: 98.4 F (36.9 C)  SpO2: 98%     Body mass index is 35.54 kg/m.   Wt Readings from Last 3 Encounters:  03/18/18 213 lb 9.6 oz (96.9 kg)  03/03/18 214 lb 9.6 oz (97.3 kg)  02/04/18 214 lb 12.8 oz (97.4 kg)      ECOG FS:1 - Symptomatic but completely ambulatory   LAB RESULTS:  CMP     Component Value  Date/Time   NA 141 03/03/2018 0830   K 3.9 03/03/2018 0830   CL 107 03/03/2018 0830   CO2 25 03/03/2018 0830   GLUCOSE 96 03/03/2018 0830   BUN 21 (H) 03/03/2018 0830   CREATININE 0.80 03/03/2018 0830   CREATININE 0.83 09/24/2016 1151   CALCIUM 9.4 03/03/2018 0830   PROT 7.3 03/03/2018 0830   ALBUMIN 4.0 03/03/2018 0830   AST 15 03/03/2018 0830   ALT 16 03/03/2018 0830   ALKPHOS 99 03/03/2018 0830   BILITOT 0.5 03/03/2018 0830   GFRNONAA >60 03/03/2018 0830   GFRNONAA 79 09/24/2016 1151   GFRAA >60 03/03/2018 0830   GFRAA >89 09/24/2016 1151    No results found for: TOTALPROTELP, ALBUMINELP, A1GS, A2GS, BETS, BETA2SER, GAMS, MSPIKE, SPEI  No results found for: KPAFRELGTCHN, LAMBDASER, KAPLAMBRATIO  Lab Results  Component Value Date   WBC 7.4 03/03/2018   NEUTROABS 5.0 03/03/2018   HGB 13.8 03/03/2018   HCT 41.3 03/03/2018   MCV 88.2 03/03/2018   PLT 202 03/03/2018    @LASTCHEMISTRY @  No results found for: LABCA2  No components found for: [id number]  No results for input(s): INR in the last 168 hours.  No results found for: LABCA2  No results found for: BJS283  No results found for: TDV761  No results found for: YWV371  No results found for: CA2729  No components found for: HGQUANT  No results found for: CEA1 / No results found for: CEA1   No results found for: AFPTUMOR  No results found for: CHROMOGRNA  No results found for: PSA1  No visits with results within 3 Day(s) from this visit.  Latest known visit with results is:  Appointment on 03/03/2018  Component Date Value Ref Range Status  . Sodium 03/03/2018 141  135 - 145 mmol/L Final  . Potassium 03/03/2018 3.9  3.5 - 5.1 mmol/L Final  . Chloride 03/03/2018 107  98 - 111 mmol/L Final  . CO2 03/03/2018 25  22 - 32 mmol/L Final  . Glucose, Bld 03/03/2018 96  70 - 99 mg/dL Final  . BUN 03/03/2018 21* 6 - 20 mg/dL Final  . Creatinine 03/03/2018 0.80  0.44 - 1.00 mg/dL Final  . Calcium 03/03/2018  9.4  8.9 - 10.3 mg/dL Final  . Total Protein 03/03/2018 7.3  6.5 - 8.1 g/dL Final  . Albumin 03/03/2018 4.0  3.5 - 5.0 g/dL Final  . AST 03/03/2018 15  15 - 41 U/L Final  . ALT 03/03/2018 16  0 - 44  U/L Final  . Alkaline Phosphatase 03/03/2018 99  38 - 126 U/L Final  . Total Bilirubin 03/03/2018 0.5  0.3 - 1.2 mg/dL Final  . GFR, Est Non Af Am 03/03/2018 >60  >60 mL/min Final  . GFR, Est AFR Am 03/03/2018 >60  >60 mL/min Final   Comment: (NOTE) The eGFR has been calculated using the CKD EPI equation. This calculation has not been validated in all clinical situations. eGFR's persistently <60 mL/min signify possible Chronic Kidney Disease.   Georgiann Hahn gap 03/03/2018 9  5 - 15 Final   Performed at [facility], [place] [address], [place], [place] [address]  . WBC Count 03/03/2018 7.4  3.9 - 10.3 K/uL Final  . RBC 03/03/2018 4.68  3.70 - 5.45 MIL/uL Final  . Hemoglobin 03/03/2018 13.8  11.6 - 15.9 g/dL Final  . HCT 03/03/2018 41.3  34.8 - 46.6 % Final  . MCV 03/03/2018 88.2  79.5 - 101.0 fL Final  . MCH 03/03/2018 29.4  25.1 - 34.0 pg Final  . MCHC 03/03/2018 33.3  31.5 - 36.0 g/dL Final  . RDW 03/03/2018 13.7  11.2 - 14.5 % Final  . Platelet Count 03/03/2018 202  145 - 400 K/uL Final  . Neutrophils Relative % 03/03/2018 68  % Final  . Neutro Abs 03/03/2018 5.0  1.5 - 6.5 K/uL Final  . Lymphocytes Relative 03/03/2018 25  % Final  . Lymphs Abs 03/03/2018 1.9  0.9 - 3.3 K/uL Final  . Monocytes Relative 03/03/2018 4  % Final  . Monocytes Absolute 03/03/2018 0.3  0.1 - 0.9 K/uL Final  . Eosinophils Relative 03/03/2018 3  % Final  . Eosinophils Absolute 03/03/2018 0.2  0.0 - 0.5 K/uL Final  . Basophils Relative 03/03/2018 0  % Final  . Basophils Absolute 03/03/2018 0.0  0.0 - 0.1 K/uL Final   Performed at [facility], [address] [place],  [address]    (this displays the last labs from the last 3 days)  No results found for:  TOTALPROTELP, ALBUMINELP, A1GS, A2GS, BETS, BETA2SER, GAMS, MSPIKE, SPEI (this displays SPEP labs)  No results found for: KPAFRELGTCHN, LAMBDASER, KAPLAMBRATIO (kappa/lambda light chains)  No results found for: HGBA, HGBA2QUANT, HGBFQUANT, HGBSQUAN (Hemoglobinopathy evaluation)   No results found for: LDH  No results found for: IRON, TIBC, IRONPCTSAT (Iron and TIBC)  No results found for: FERRITIN  Urinalysis    Component Value Date/Time   COLORURINE CANCELED 09/24/2016 1151   APPEARANCEUR CANCELED 09/24/2016 1151   LABSPEC CANCELED 09/24/2016 1151   PHURINE CANCELED 09/24/2016 1151   GLUCOSEU CANCELED 09/24/2016 1151   HGBUR CANCELED 09/24/2016 1151   BILIRUBINUR CANCELED 09/24/2016 1151   KETONESUR CANCELED 09/24/2016 1151   PROTEINUR CANCELED 09/24/2016 1151   NITRITE CANCELED 09/24/2016 1151   LEUKOCYTESUR CANCELED 09/24/2016 1151     STUDIES:  Ct Chest W Contrast  Result Date: 03/16/2018 CLINICAL DATA:  New diagnosis of invasive right breast cancer. EXAM: CT CHEST WITH CONTRAST TECHNIQUE: Multidetector CT imaging of the chest was performed during intravenous contrast administration. CONTRAST:  32m OMNIPAQUE IOHEXOL 300 MG/ML  SOLN COMPARISON:  Radiographs 11/15/2009. FINDINGS: Cardiovascular: No significant vascular findings. There is limited opacification of the pulmonary arteries. Probable faint coronary artery calcifications. The heart size is normal. There is no pericardial effusion. Mediastinum/Nodes: There are no enlarged mediastinal, hilar, axillary or internal mammary lymph nodes. The thyroid gland and trachea appear normal. There is a small hiatal hernia. Lungs/Pleura: There is  no pleural effusion. The lungs are clear. Upper abdomen: Subjective mild hepatic steatosis without focal abnormality. There is a nonspecific 2.9 x 2.4 cm left adrenal nodule measuring 32 HU. Musculoskeletal/Chest wall: Ill-defined right breast mass noted with associated central biopsy clip. No  suspicious osseous findings. IMPRESSION: 1. No evidence of thoracic metastatic disease. 2. Indeterminate left adrenal nodule. Consider further characterization with noncontrast CT through the adrenal glands. 3. Subjective mild hepatic steatosis. 4. Known right breast mass, biopsy proven malignancy. Electronically Signed   By: Richardean Sale M.D.   On: 03/16/2018 17:27   Nm Bone Scan Whole Body  Result Date: 03/16/2018 CLINICAL DATA:  New diagnosis of invasive right breast cancer. EXAM: NUCLEAR MEDICINE WHOLE BODY BONE SCAN TECHNIQUE: Whole body anterior and posterior images were obtained approximately 3 hours after intravenous injection of radiopharmaceutical. RADIOPHARMACEUTICALS:  20.0 mCi Technetium-47mMDP IV COMPARISON:  Chest CT same date. FINDINGS: There is no osseous uptake suspicious for metastatic disease. There is mild generalized calvarial uptake consistent with hyperostosis. There is multifocal large joint activity involving the elbows, wrists, knees and feet consistent with arthropathy. The soft tissue activity appears unremarkable aside from mild urine contamination in the perineum. IMPRESSION: No evidence of osseous metastatic disease. Electronically Signed   By: WRichardean SaleM.D.   On: 03/16/2018 17:49   Mr Breast Bilateral W W[place]Cad  Result Date: 03/12/2018 CLINICAL DATA:  Ultrasound-guided core biopsy of mass the 10 o'clock location of the RIGHT breast revealed invasive ductal carcinoma in a background of high-grade ductal carcinoma in situ. Ultrasound-guided core biopsy of an enlarged RIGHT axillary lymph node demonstrated metastatic breast carcinoma. LABS:  None EXAM: BILATERAL BREAST MRI WITH AND WITHOUT CONTRAST TECHNIQUE: Multiplanar, multisequence MR images of both breasts were obtained prior to and following the intravenous administration of 19 ml of MultiHance. Following the contrast injection, patient experienced sneezing and coughing due to contrast allergy.  THREE-DIMENSIONAL MR IMAGE RENDERING ON INDEPENDENT WORKSTATION: Three-dimensional MR images were rendered by post-processing of the original MR data on an independent workstation. The three-dimensional MR images were interpreted, and findings are reported in the following complete MRI report for this study. Three dimensional images were evaluated at the independent DynaCad workstation COMPARISON:  02/25/2018 and earlier FINDINGS: Breast composition: c. Heterogeneous fibroglandular tissue. Background parenchymal enhancement: Moderate. Right breast: Within the LATERAL aspect of the RIGHT breast there is an enhancing mass measuring 2.4 x 2.4x 2.4 centimeters. Mass demonstrates plateau type enhancement kinetics and contains a clip from recent ultrasound-guided core biopsy. There is non mass enhancement surrounding this lesion, estimated to be at least a total of 8.9 (anterior posterior) x 5.4 (superior to inferior) centimeters. This enhancement extends approximately 2.4 centimeters anterior to the mass and 4.6 centimeters posterior to the mass. Left breast: No mass or abnormal enhancement. Lymph nodes: Patient had an ultrasound-guided core biopsy of an enlarged RIGHT axillary lymph node. In addition to this abnormal lymph node, there are at least 5 small rounded lymph nodes including intramammary lymph nodes and LOWER axillary lymph nodes which are suspicious based on their morphology. The evaluation of the RIGHT axilla is limited due to significant motion on STIR sequences. No suspicious internal mammary lymph nodes. LEFT axilla is negative. Ancillary findings:  None. IMPRESSION: 1. In addition to the known cancer in the LATERAL portion of the RIGHT breast, there is additional non mass enhancement extending anterior and posterior to this mass, suspicious for additional DCIS, measuring a total of 8.9 centimeters in anterior-posterior extent.  2. Additional enlarged RIGHT axillary and intramammary lymph nodes. 3. LEFT  breast is negative. RECOMMENDATION: If the patient is a candidate for breast conservation, additional biopsies of the RIGHT breast are indicated. The patient has a possible allergy to MultiHance gadolinium contrast agent and will need 13 hour premedication prior to MR guided core biopsies. BI-RADS CATEGORY  4: Suspicious. Electronically Signed   By: Nolon Nations M.D.   On: 03/12/2018 15:56   [place] Axillary Node Core Biopsy Right  Addendum Date: 03/01/2018   ADDENDUM REPORT: 03/01/2018 07:49 ADDENDUM: Pathology revealed MICROSCOPIC FOCUS OF INVASIVE DUCTAL CARCINOMA ARISING IN A BACKGROUND OF HIGH GRADE DUCTAL CARCINOMA IN SITU of the Right breast, outer upper quadrant. METASTATIC BREAST CARCINOMA TO the Right axillary lymph node. This was found to be concordant by Dr. Kristopher Oppenheim. Pathology results were discussed with the patient by telephone. The patient reported doing well after the biopsies with tenderness at the sites. Post biopsy instructions and care were reviewed and questions were answered. The patient was encouraged to call [facility] for any additional concerns. The patient was referred to [facility] on March 03, 2018. Recommendation for a bilateral breast MRI for further evaluation of extent of disease and heterogeneously dense breasts. Additionally, a mass in the upper outer quadrant of the right breast at posterior depth is suspicious for an enlarging intramammary lymph node. An ultrasound correlate was identified during real-time scanning at the time of the patient's biopsy. Additional ultrasound guided core biopsy was discussed at the time, but could not be safely performed due to the patient's significant anxiety. Pending breast MRI results, if breast conservation therapy is a consideration, additional localization prior to surgery or additional attempted biopsy/clip placement prior to  neoadjuvant chemotherapy is recommended. Pathology results reported by Terie Purser, RN on 03/01/2018. Electronically Signed   By: Kristopher Oppenheim M.D.   On: 03/01/2018 07:49   Result Date: 03/01/2018 CLINICAL DATA:  57 year old female with a suspicious right breast mass and right axillary lymph node. EXAM: ULTRASOUND GUIDED RIGHT BREAST CORE NEEDLE BIOPSY COMPARISON:  Previous exam(s). FINDINGS: I met with the patient and we discussed the procedure of ultrasound-guided biopsy, including benefits and alternatives. We discussed the high likelihood of a successful procedure. We discussed the risks of the procedure, including infection, bleeding, tissue injury, clip migration, and inadequate sampling. Informed written consent was given. The usual time-out protocol was performed immediately prior to the procedure. Lesion quadrant: Upper-outer quadrant Using sterile technique and 1% Lidocaine as local anesthetic, under direct ultrasound visualization, a 12 gauge spring-loaded device was used to perform biopsy of a right breast mass using a lateral approach. At the conclusion of the procedure a ribbon shaped tissue marker clip was deployed into the biopsy cavity. Using sterile technique and 1% Lidocaine as local anesthetic, under direct ultrasound visualization, a 14 gauge spring-loaded device was used to perform biopsy of a right axillary lymph node using a lateral approach. At the conclusion of the procedure a HydroMARK tissue marker clip was deployed into the biopsy cavity. Follow up 2 view mammogram was performed and dictated separately. Of note, the patient had significant anxiety during the procedure making additional samples difficult to obtain safely. During real-time scanning, an additional round, circumscribed hypoechoic mass is identified at the 10 o'clock position 8 cm from the nipple. The option for an additional third biopsy was discussed with the patient. However, the decision was made not  to perform this  biopsy due to the patient's significant anxiety at the time of the procedures. IMPRESSION: 1. Ultrasound guided biopsy of a right breast mass and right axillary lymph node. 2. An additional 6 cm hypoechoic mass is identified at the 10 o'clock position 8 cm from the nipple during real-time scanning. This may represent an intramammary lymph node. Additional biopsy was not performed at this time due to the patient's significant level of anxiety. Electronically Signed: By: Kristopher Oppenheim M.D. On: 02/25/2018 15:45   Mm Clip Placement Right  Result Date: 02/25/2018 CLINICAL DATA:  57 year old female status post 2 area biopsy of the right breast and axilla. EXAM: DIAGNOSTIC RIGHT MAMMOGRAM POST ULTRASOUND BIOPSY COMPARISON:  Previous exam(s). FINDINGS: Mammographic images were obtained following ultrasound-guided biopsy of the right breast. Ribbon shaped clip is identified in the upper outer quadrant of the right breast at middle depth in relation to the previously identified spiculated mass. A HydroMARK clip is identified in the right axilla. A 6 mm circumscribed masses identified in the upper outer quadrant at posterior depth, likely relating to the additional sonographically identified mass. IMPRESSION: 1. Post biopsy clips in the expected location of the right breast and right axilla. 2. Additional 6 mm circumscribed mass in the upper outer quadrant posterior depth likely correlates with the sonographically identified mass. Findings are suggestive of an intramammary lymph node. Additional localization of this area is recommended pending pathology results. The patient had difficulty tolerating the biopsies due to significant anxiety. Final Assessment: Post Procedure Mammograms for Marker Placement Electronically Signed   By: Kristopher Oppenheim M.D.   On: 02/25/2018 15:48   [place] Rt Breast Bx W Loc Dev 1st Lesion Img Bx Spec US Guide  Addendum Date: 03/01/2018   ADDENDUM REPORT: 03/01/2018 07:49 ADDENDUM: Pathology  revealed MICROSCOPIC FOCUS OF INVASIVE DUCTAL CARCINOMA ARISING IN A BACKGROUND OF HIGH GRADE DUCTAL CARCINOMA IN SITU of the Right breast, outer upper quadrant. METASTATIC BREAST CARCINOMA TO the Right axillary lymph node. This was found to be concordant by Dr. Kristopher Oppenheim. Pathology results were discussed with the patient by telephone. The patient reported doing well after the biopsies with tenderness at the sites. Post biopsy instructions and care were reviewed and questions were answered. The patient was encouraged to call [facility] for any additional concerns. The patient was referred to [facility] on March 03, 2018. Recommendation for a bilateral breast MRI for further evaluation of extent of disease and heterogeneously dense breasts. Additionally, a mass in the upper outer quadrant of the right breast at posterior depth is suspicious for an enlarging intramammary lymph node. An ultrasound correlate was identified during real-time scanning at the time of the patient's biopsy. Additional ultrasound guided core biopsy was discussed at the time, but could not be safely performed due to the patient's significant anxiety. Pending breast MRI results, if breast conservation therapy is a consideration, additional localization prior to surgery or additional attempted biopsy/clip placement prior to neoadjuvant chemotherapy is recommended. Pathology results reported by Terie Purser, RN on 03/01/2018. Electronically Signed   By: Kristopher Oppenheim M.D.   On: 03/01/2018 07:49   Result Date: 03/01/2018 CLINICAL DATA:  57 year old female with a suspicious right breast mass and right axillary lymph node. EXAM: ULTRASOUND GUIDED RIGHT BREAST CORE NEEDLE BIOPSY COMPARISON:  Previous exam(s). FINDINGS: I met with the patient and we discussed the procedure of ultrasound-guided biopsy, including benefits and alternatives.  We  discussed the high likelihood of a successful procedure. We discussed the risks of the procedure, including infection, bleeding, tissue injury, clip migration, and inadequate sampling. Informed written consent was given. The usual time-out protocol was performed immediately prior to the procedure. Lesion quadrant: Upper-outer quadrant Using sterile technique and 1% Lidocaine as local anesthetic, under direct ultrasound visualization, a 12 gauge spring-loaded device was used to perform biopsy of a right breast mass using a lateral approach. At the conclusion of the procedure a ribbon shaped tissue marker clip was deployed into the biopsy cavity. Using sterile technique and 1% Lidocaine as local anesthetic, under direct ultrasound visualization, a 14 gauge spring-loaded device was used to perform biopsy of a right axillary lymph node using a lateral approach. At the conclusion of the procedure a HydroMARK tissue marker clip was deployed into the biopsy cavity. Follow up 2 view mammogram was performed and dictated separately. Of note, the patient had significant anxiety during the procedure making additional samples difficult to obtain safely. During real-time scanning, an additional round, circumscribed hypoechoic mass is identified at the 10 o'clock position 8 cm from the nipple. The option for an additional third biopsy was discussed with the patient. However, the decision was made not to perform this biopsy due to the patient's significant anxiety at the time of the procedures. IMPRESSION: 1. Ultrasound guided biopsy of a right breast mass and right axillary lymph node. 2. An additional 6 cm hypoechoic mass is identified at the 10 o'clock position 8 cm from the nipple during real-time scanning. This may represent an intramammary lymph node. Additional biopsy was not performed at this time due to the patient's significant level of anxiety. Electronically Signed: By: Kristopher Oppenheim M.D. On: 02/25/2018 15:45     ELIGIBLE FOR AVAILABLE RESEARCH PROTOCOL:BCEP  ASSESSMENT: 57 y.o. [place] woman status post right breast upper outer quadrant biopsy 02/25/2018 for a clinical T2 pN1, stage IIB invasive ductal carcinoma, estrogen and progesterone receptor negative, HER-2 amplified, with an MIB-1 of 50%.  (a) biopsy of a 0.6 cm suspicious area in the right breast pending  (1) genetics testing pending  (2) neoadjuvant chemotherapy will consist of carboplatin, docetaxel, trastuzumab and Pertuzumab every 21 days x 6 starting 03/23/2018  (3) anti-HER-2 treatment will continue for a minimum of 6 months  (a) baseline echocardiogram 03/16/2018 shows an ejection fraction in the 55-60% range.  (4) definitive surgery with targeted axillary dissection to follow  (5) adjuvant radiation  (6) left adrenal adenoma measuring 2.9 cm on CT of the chest 03/16/2018  PLAN: I reviewed with the patient all her studies which are very favorable.  There is no evidence of metastatic disease.  She has an excellent heart function.  She has a very small adrenal adenoma.  That will require follow-up.  We will plan a noncontrast CT through the adrenals at some point in the future  I showed her an image of her MRI so she can see what we are dealing with and what non-masslike enhancement looks like.  I gave her a roadmap on how to take her supportive medicines.  All those prescriptions have been put in for her and she should get them filled within the next 2 days.  If for some reason she is not able to obtain them she will let us know as we do have some funds that can be made available for her.  I emphasized that it is important for her to follow the instructions on the roadmap if she  wants to have minimal to no problems with nausea.  We again reviewed the possible toxicity side effects and complications of chemotherapy including hair loss, which an extremely rare cases could be permanent, weakening of the heart muscle and  peripheral neuropathy.  We are going to see her with her treatment and approximately a week later but I also encouraged her to call with any other questions or concerns that she may have before the next visit.   [name], [name], MD  03/18/18 12:19 PM Medical Oncology and Hematology [facility] [address] [place], [place] [address] Tel. [phone]    Fax. [phone]    I, Soijett Blue am acting as scribe for Dr. Sarajane Jews C. [name].  I, Lurline Del MD, have reviewed the above documentation for accuracy and completeness, and I agree with the above.

## 2018-03-18 NOTE — Discharge Instructions (Signed)
Moderate Conscious Sedation, Adult, Care After °These instructions provide you with information about caring for yourself after your procedure. Your health care provider may also give you more specific instructions. Your treatment has been planned according to current medical practices, but problems sometimes occur. Call your health care provider if you have any problems or questions after your procedure. °What can I expect after the procedure? °After your procedure, it is common: °· To feel sleepy for several hours. °· To feel clumsy and have poor balance for several hours. °· To have poor judgment for several hours. °· To vomit if you eat too soon. ° °Follow these instructions at home: °For at least 24 hours after the procedure: ° °· Do not: °? Participate in activities where you could fall or become injured. °? Drive. °? Use heavy machinery. °? Drink alcohol. °? Take sleeping pills or medicines that cause drowsiness. °? Make important decisions or sign legal documents. °? Take care of children on your own. °· Rest. °Eating and drinking °· Follow the diet recommended by your health care provider. °· If you vomit: °? Drink water, juice, or soup when you can drink without vomiting. °? Make sure you have little or no nausea before eating solid foods. °General instructions °· Have a responsible adult stay with you until you are awake and alert. °· Take over-the-counter and prescription medicines only as told by your health care provider. °· If you smoke, do not smoke without supervision. °· Keep all follow-up visits as told by your health care provider. This is important. °Contact a health care provider if: °· You keep feeling nauseous or you keep vomiting. °· You feel light-headed. °· You develop a rash. °· You have a fever. °Get help right away if: °· You have trouble breathing. °This information is not intended to replace advice given to you by your health care provider. Make sure you discuss any questions you have  with your health care provider. °Document Released: 05/18/2013 Document Revised: 12/31/2015 Document Reviewed: 11/17/2015 °Elsevier Interactive Patient Education © 2018 [employer] ° ° °Implanted Port Insertion, Care After °This sheet gives you information about how to care for yourself after your procedure. Your health care provider may also give you more specific instructions. If you have problems or questions, contact your health care provider. °What can I expect after the procedure? °After your procedure, it is common to have: °· Discomfort at the port insertion site. °· Bruising on the skin over the port. This should improve over 3-4 days. ° °Follow these instructions at home: °Port care °· After your port is placed, you will get a manufacturer's information card. The card has information about your port. Keep this card with you at all times. °· Take care of the port as told by your health care provider. Ask your health care provider if you or a family member can get training for taking care of the port at home. A home health care nurse may also take care of the port. °· Make sure to remember what type of port you have. °Incision care °· Follow instructions from your health care provider about how to take care of your port insertion site. Make sure you: °? Wash your hands with soap and water before you change your bandage (dressing). If soap and water are not available, use hand sanitizer. °? Change your dressing as told by your health care provider.  You may remove your dressing tomorrow. °? Leave skin glue in place. These skin closures   may need to stay in place for 2 weeks or longer. If adhesive strip edges start to loosen and curl up, you may trim the loose edges. Do not remove adhesive strips completely unless your health care provider tells you to do that.  Check your port insertion site every day for signs of infection. Check for: ? More redness, swelling, or pain. ? More fluid or  blood. ? Warmth. ? Pus or a bad smell. General instructions  Do not take baths, swim, or use a hot tub until your health care provider approves.  You may shower tomorrow.  Do not lift anything that is heavier than 10 lb (4.5 kg) for a week, or as told by your health care provider.  Ask your health care provider when it is okay to: ? Return to work or school. ? Resume usual physical activities or sports.  Do not drive for 24 hours if you were given a medicine to help you relax (sedative).  Take over-the-counter and prescription medicines only as told by your health care provider.  Wear a medical alert bracelet in case of an emergency. This will tell any health care providers that you have a port.  Keep all follow-up visits as told by your health care provider. This is important. Contact a health care provider if:  You have a fever or chills.  You have more redness, swelling, or pain around your port insertion site.  You have more fluid or blood coming from your port insertion site.  Your port insertion site feels warm to the touch.  You have pus or a bad smell coming from the port insertion site. Get help right away if:  You have chest pain or shortness of breath.  You have bleeding from your port that you cannot control. Summary  Take care of the port as told by your health care provider.  Change your dressing as told by your health care provider.  Keep all follow-up visits as told by your health care provider. This information is not intended to replace advice given to you by your health care provider. Make sure you discuss any questions you have with your health care provider. Document Released: 05/18/2013 Document Revised: 06/18/2016 Document Reviewed: 06/18/2016 Elsevier Interactive Patient Education  2017 [employer].

## 2018-03-18 NOTE — Procedures (Signed)
Interventional Radiology Procedure Note  Procedure: Single Lumen Power Port Placement    Access:  Left IJ vein.  Findings: Catheter tip positioned at SVC/RA junction. Port is ready for immediate use.   Complications: None  EBL: < 10 mL  Recommendations:  - Ok to shower in 24 hours - Do not submerge for 7 days - Routine line care   Ayjah Show T. Kierrah Kilbride, M.D Pager:  [phone]   

## 2018-03-19 ENCOUNTER — Inpatient Hospital Stay: Payer: Medicaid Other | Admitting: Oncology

## 2018-03-19 MED FILL — LIDOCAINE-PRILOCAINE CREAM: 2.5-2.5 | 30 days supply | Qty: 30 | Fill #0 | Status: TO

## 2018-03-19 MED FILL — PROCHLORPERAZINE 10 MG TAB: 10 | 8 days supply | Qty: 30 | Fill #0 | Status: TO

## 2018-03-23 ENCOUNTER — Other Ambulatory Visit: Payer: Self-pay | Admitting: Surgery

## 2018-03-23 ENCOUNTER — Encounter: Payer: Self-pay | Admitting: *Deleted

## 2018-03-23 ENCOUNTER — Inpatient Hospital Stay: Payer: Medicaid Other

## 2018-03-23 ENCOUNTER — Encounter: Payer: Self-pay | Admitting: Oncology

## 2018-03-23 VITALS — BP 99/70 | HR 77 | Temp 99.1°F | Resp 18

## 2018-03-23 DIAGNOSIS — C50411 Malignant neoplasm of upper-outer quadrant of right female breast: Secondary | ICD-10-CM

## 2018-03-23 DIAGNOSIS — Z171 Estrogen receptor negative status [ER-]: Secondary | ICD-10-CM

## 2018-03-23 DIAGNOSIS — Z5111 Encounter for antineoplastic chemotherapy: Secondary | ICD-10-CM | POA: Diagnosis not present

## 2018-03-23 DIAGNOSIS — J45901 Unspecified asthma with (acute) exacerbation: Secondary | ICD-10-CM

## 2018-03-23 DIAGNOSIS — J441 Chronic obstructive pulmonary disease with (acute) exacerbation: Secondary | ICD-10-CM

## 2018-03-23 LAB — COMPREHENSIVE METABOLIC PANEL
ALK PHOS: 90 U/L (ref 38–126)
ALT: 16 U/L (ref 0–44)
AST: 14 U/L — AB (ref 15–41)
Albumin: 3.9 g/dL (ref 3.5–5.0)
Anion gap: 12 (ref 5–15)
BUN: 13 mg/dL (ref 6–20)
CALCIUM: 9.3 mg/dL (ref 8.9–10.3)
CO2: 23 mmol/L (ref 22–32)
CREATININE: 0.75 mg/dL (ref 0.44–1.00)
Chloride: 107 mmol/L (ref 98–111)
Glucose, Bld: 90 mg/dL (ref 70–99)
Potassium: 4 mmol/L (ref 3.5–5.1)
Sodium: 142 mmol/L (ref 135–145)
Total Bilirubin: 1.2 mg/dL (ref 0.3–1.2)
Total Protein: 7.3 g/dL (ref 6.5–8.1)

## 2018-03-23 LAB — CBC WITH DIFFERENTIAL/PLATELET
BASOS PCT: 1 %
Basophils Absolute: 0.1 10*3/uL (ref 0.0–0.1)
EOS ABS: 0.2 10*3/uL (ref 0.0–0.5)
EOS PCT: 2 %
HCT: 40.1 % (ref 34.8–46.6)
HEMOGLOBIN: 13.5 g/dL (ref 11.6–15.9)
Lymphocytes Relative: 25 %
Lymphs Abs: 2.1 10*3/uL (ref 0.9–3.3)
MCH: 29.6 pg (ref 25.1–34.0)
MCHC: 33.6 g/dL (ref 31.5–36.0)
MCV: 88 fL (ref 79.5–101.0)
MONOS PCT: 5 %
Monocytes Absolute: 0.4 10*3/uL (ref 0.1–0.9)
NEUTROS PCT: 67 %
Neutro Abs: 5.8 10*3/uL (ref 1.5–6.5)
PLATELETS: 226 10*3/uL (ref 145–400)
RBC: 4.56 MIL/uL (ref 3.70–5.45)
RDW: 14 % (ref 11.2–14.5)
WBC: 8.6 10*3/uL (ref 3.9–10.3)

## 2018-03-23 MED ORDER — DIPHENHYDRAMINE HCL 25 MG PO CAPS
ORAL_CAPSULE | ORAL | Status: AC
  Filled 2018-03-23: qty 1

## 2018-03-23 MED ORDER — DIPHENHYDRAMINE HCL 25 MG PO CAPS
25.0000 mg | ORAL_CAPSULE | Freq: Once | ORAL | Status: AC
  Administered 2018-03-23: 25 mg via ORAL

## 2018-03-23 MED ORDER — SODIUM CHLORIDE 0.9% FLUSH
10.0000 mL | INTRAVENOUS | Status: DC | PRN
  Administered 2018-03-23: 10 mL
  Filled 2018-03-23: qty 10

## 2018-03-23 MED ORDER — FAMOTIDINE IN NACL 20-0.9 MG/50ML-% IV SOLN
20.0000 mg | Freq: Two times a day (BID) | INTRAVENOUS | Status: DC
  Administered 2018-03-23: 20 mg via INTRAVENOUS

## 2018-03-23 MED ORDER — MEPERIDINE HCL 25 MG/ML IJ SOLN
12.5000 mg | Freq: Once | INTRAMUSCULAR | Status: AC
  Administered 2018-03-23: 12.5 mg via INTRAVENOUS

## 2018-03-23 MED ORDER — PALONOSETRON HCL INJECTION 0.25 MG/5ML
0.2500 mg | Freq: Once | INTRAVENOUS | Status: AC
  Administered 2018-03-23: 0.25 mg via INTRAVENOUS

## 2018-03-23 MED ORDER — ACETAMINOPHEN 325 MG PO TABS
ORAL_TABLET | ORAL | Status: AC
  Filled 2018-03-23: qty 2

## 2018-03-23 MED ORDER — SODIUM CHLORIDE 0.9 % IV SOLN
Freq: Once | INTRAVENOUS | Status: AC
  Administered 2018-03-23: 10:00:00 via INTRAVENOUS
  Filled 2018-03-23: qty 250

## 2018-03-23 MED ORDER — PALONOSETRON HCL INJECTION 0.25 MG/5ML
INTRAVENOUS | Status: AC
  Filled 2018-03-23: qty 5

## 2018-03-23 MED ORDER — SODIUM CHLORIDE 0.9 % IV SOLN
75.0000 mg/m2 | Freq: Once | INTRAVENOUS | Status: AC
  Administered 2018-03-23: 160 mg via INTRAVENOUS
  Filled 2018-03-23: qty 16

## 2018-03-23 MED ORDER — MEPERIDINE HCL 25 MG/ML IJ SOLN
INTRAMUSCULAR | Status: AC
  Filled 2018-03-23: qty 1

## 2018-03-23 MED ORDER — SODIUM CHLORIDE 0.9 % IV SOLN
720.0000 mg | Freq: Once | INTRAVENOUS | Status: AC
  Administered 2018-03-23: 720 mg via INTRAVENOUS
  Filled 2018-03-23: qty 72

## 2018-03-23 MED ORDER — HEPARIN SOD (PORK) LOCK FLUSH 100 UNIT/ML IV SOLN
500.0000 [IU] | Freq: Once | INTRAVENOUS | Status: AC | PRN
Start: 2018-03-23 — End: 2018-03-23
  Administered 2018-03-23: 500 [IU]
  Filled 2018-03-23: qty 5

## 2018-03-23 MED ORDER — SODIUM CHLORIDE 0.9 % IV SOLN
750.0000 mg | Freq: Once | INTRAVENOUS | Status: AC
  Administered 2018-03-23: 750 mg via INTRAVENOUS
  Filled 2018-03-23: qty 35.7

## 2018-03-23 MED ORDER — FOSAPREPITANT DIMEGLUMINE INJECTION 150 MG
Freq: Once | INTRAVENOUS | Status: AC
  Administered 2018-03-23: 15:00:00 via INTRAVENOUS
  Filled 2018-03-23: qty 5

## 2018-03-23 MED ORDER — SODIUM CHLORIDE 0.9 % IV SOLN
840.0000 mg | Freq: Once | INTRAVENOUS | Status: AC
  Administered 2018-03-23: 840 mg via INTRAVENOUS
  Filled 2018-03-23: qty 28

## 2018-03-23 MED ORDER — ACETAMINOPHEN 325 MG PO TABS
650.0000 mg | ORAL_TABLET | Freq: Once | ORAL | Status: AC
  Administered 2018-03-23: 650 mg via ORAL

## 2018-03-23 MED FILL — DEXAMETHASONE 4 MG TABLET: 4 | 7 days supply | Qty: 30 | Fill #0

## 2018-03-23 NOTE — Progress Notes (Signed)
Met with patient briefly in infusion to discuss [employer] grant per her request. Patient unemployed but had letter of support in [place] from [place]. With this information provided, patient approved for one-time $1000 grant. Patient has a copy of the approval letter as well as the expense sheet it covers and the outpatient pharmacy information. Advised patient all expenses are paid directly to the company providing the service and the invoice must be presented. Patient has my card for any additional financial questions or concerns. She verbalized understanding.

## 2018-03-23 NOTE — Progress Notes (Signed)
1340- pt Perjeta complete and pt complaint of severe cold and body ache, pt visually shivering.  NS wide, VSS and Sandi Mealy, [place] notified.  1347- verbal orders received from [facility]ice to administer 12.5 mg IV Demerol.  1349- pt still having complaint of severe cold sensation, pt anxious and began breathing rapidly, 2L [place] applied and verbal orders received from [employer], [place] to administer 20 mg IVPB pepcid.   1355- pt symptoms resoled and will begin one hour observation period post perjeta.

## 2018-03-23 NOTE — Patient Instructions (Signed)
[facility] Discharge Instructions for Patients Receiving Chemotherapy  Today you received the following chemotherapy agents Herceptin, perjeta, taxotere, and carboplatin  To help prevent nausea and vomiting after your treatment, we encourage you to take your nausea medication as directed   If you develop nausea and vomiting that is not controlled by your nausea medication, call the clinic.   BELOW ARE SYMPTOMS THAT SHOULD BE REPORTED IMMEDIATELY:  *FEVER GREATER THAN 100.5 F  *CHILLS WITH OR WITHOUT FEVER  NAUSEA AND VOMITING THAT IS NOT CONTROLLED WITH YOUR NAUSEA MEDICATION  *UNUSUAL SHORTNESS OF BREATH  *UNUSUAL BRUISING OR BLEEDING  TENDERNESS IN MOUTH AND THROAT WITH OR WITHOUT PRESENCE OF ULCERS  *URINARY PROBLEMS  *BOWEL PROBLEMS  UNUSUAL RASH Items with * indicate a potential emergency and should be followed up as soon as possible.  Feel free to call the clinic should you have any questions or concerns. The clinic phone number is [phone].  Please show the [facility] at check-in to the Emergency Department and triage nurse.   Trastuzumab (Herceptin) injection for infusion What is this medicine? TRASTUZUMAB (tras TOO zoo mab) is a monoclonal antibody. It is used to treat breast cancer and stomach cancer. This medicine may be used for other purposes; ask your health care provider or pharmacist if you have questions. COMMON BRAND NAME(S): Herceptin What should I tell my health care provider before I take this medicine? They need to know if you have any of these conditions: -heart disease -heart failure -lung or breathing disease, like asthma -an unusual or allergic reaction to trastuzumab, benzyl alcohol, or other medications, foods, dyes, or preservatives -pregnant or trying to get pregnant -breast-feeding How should I use this medicine? This drug is given as an infusion into a vein. It is administered in a hospital or clinic by a  specially trained health care professional. Talk to your pediatrician regarding the use of this medicine in children. This medicine is not approved for use in children. Overdosage: If you think you have taken too much of this medicine contact a poison control center or emergency room at once. NOTE: This medicine is only for you. Do not share this medicine with others. What if I miss a dose? It is important not to miss a dose. Call your doctor or health care professional if you are unable to keep an appointment. What may interact with this medicine? This medicine may interact with the following medications: -certain types of chemotherapy, such as daunorubicin, doxorubicin, epirubicin, and idarubicin This list may not describe all possible interactions. Give your health care provider a list of all the medicines, herbs, non-prescription drugs, or dietary supplements you use. Also tell them if you smoke, drink alcohol, or use illegal drugs. Some items may interact with your medicine. What should I watch for while using this medicine? Visit your doctor for checks on your progress. Report any side effects. Continue your course of treatment even though you feel ill unless your doctor tells you to stop. Call your doctor or health care professional for advice if you get a fever, chills or sore throat, or other symptoms of a cold or flu. Do not treat yourself. Try to avoid being around people who are sick. You may experience fever, chills and shaking during your first infusion. These effects are usually mild and can be treated with other medicines. Report any side effects during the infusion to your health care professional. Fever and chills usually do not happen with later  infusions. Do not become pregnant while taking this medicine or for 7 months after stopping it. Women should inform their doctor if they wish to become pregnant or think they might be pregnant. Women of child-bearing potential will need to  have a negative pregnancy test before starting this medicine. There is a potential for serious side effects to an unborn child. Talk to your health care professional or pharmacist for more information. Do not breast-feed an infant while taking this medicine or for 7 months after stopping it. Women must use effective birth control with this medicine. What side effects may I notice from receiving this medicine? Side effects that you should report to your doctor or health care professional as soon as possible: -allergic reactions like skin rash, itching or hives, swelling of the face, lips, or tongue -chest pain or palpitations -cough -dizziness -feeling faint or lightheaded, falls -fever -general ill feeling or flu-like symptoms -signs of worsening heart failure like breathing problems; swelling in your legs and feet -unusually weak or tired Side effects that usually do not require medical attention (report to your doctor or health care professional if they continue or are bothersome): -bone pain -changes in taste -diarrhea -joint pain -nausea/vomiting -weight loss This list may not describe all possible side effects. Call your doctor for medical advice about side effects. You may report side effects to FDA at 1-800-FDA-1088. Where should I keep my medicine? This drug is given in a hospital or clinic and will not be stored at home. NOTE: This sheet is a summary. It may not cover all possible information. If you have questions about this medicine, talk to your doctor, pharmacist, or health care provider.  2018 Elsevier/Gold Standard (2016-07-22 14:37:52)   Pertuzumab (Perjeta) injection What is this medicine? PERTUZUMAB (per TOOZ ue mab) is a monoclonal antibody. It is used to treat breast cancer. This medicine may be used for other purposes; ask your health care provider or pharmacist if you have questions. COMMON BRAND NAME(S): PERJETA What should I tell my health care provider before  I take this medicine? They need to know if you have any of these conditions: -heart disease -heart failure -high blood pressure -history of irregular heart beat -recent or ongoing radiation therapy -an unusual or allergic reaction to pertuzumab, other medicines, foods, dyes, or preservatives -pregnant or trying to get pregnant -breast-feeding How should I use this medicine? This medicine is for infusion into a vein. It is given by a health care professional in a hospital or clinic setting. Talk to your pediatrician regarding the use of this medicine in children. Special care may be needed. Overdosage: If you think you have taken too much of this medicine contact a poison control center or emergency room at once. NOTE: This medicine is only for you. Do not share this medicine with others. What if I miss a dose? It is important not to miss your dose. Call your doctor or health care professional if you are unable to keep an appointment. What may interact with this medicine? Interactions are not expected. Give your health care provider a list of all the medicines, herbs, non-prescription drugs, or dietary supplements you use. Also tell them if you smoke, drink alcohol, or use illegal drugs. Some items may interact with your medicine. This list may not describe all possible interactions. Give your health care provider a list of all the medicines, herbs, non-prescription drugs, or dietary supplements you use. Also tell them if you smoke, drink alcohol, or use illegal  drugs. Some items may interact with your medicine. What should I watch for while using this medicine? Your condition will be monitored carefully while you are receiving this medicine. Report any side effects. Continue your course of treatment even though you feel ill unless your doctor tells you to stop. Do not become pregnant while taking this medicine or for 7 months after stopping it. Women should inform their doctor if they wish to  become pregnant or think they might be pregnant. Women of child-bearing potential will need to have a negative pregnancy test before starting this medicine. There is a potential for serious side effects to an unborn child. Talk to your health care professional or pharmacist for more information. Do not breast-feed an infant while taking this medicine or for 7 months after stopping it. Women must use effective birth control with this medicine. Call your doctor or health care professional for advice if you get a fever, chills or sore throat, or other symptoms of a cold or flu. Do not treat yourself. Try to avoid being around people who are sick. You may experience fever, chills, and headache during the infusion. Report any side effects during the infusion to your health care professional. What side effects may I notice from receiving this medicine? Side effects that you should report to your doctor or health care professional as soon as possible: -breathing problems -chest pain or palpitations -dizziness -feeling faint or lightheaded -fever or chills -skin rash, itching or hives -sore throat -swelling of the face, lips, or tongue -swelling of the legs or ankles -unusually weak or tired Side effects that usually do not require medical attention (report to your doctor or health care professional if they continue or are bothersome): -diarrhea -hair loss -nausea, vomiting -tiredness This list may not describe all possible side effects. Call your doctor for medical advice about side effects. You may report side effects to FDA at 1-800-FDA-1088. Where should I keep my medicine? This drug is given in a hospital or clinic and will not be stored at home. NOTE: This sheet is a summary. It may not cover all possible information. If you have questions about this medicine, talk to your doctor, pharmacist, or health care provider.  2018 Elsevier/Gold Standard (2015-08-30 12:08:50)   Docetaxel (Taxotere)  injection What is this medicine? DOCETAXEL (doe se TAX el) is a chemotherapy drug. It targets fast dividing cells, like cancer cells, and causes these cells to die. This medicine is used to treat many types of cancers like breast cancer, certain stomach cancers, head and neck cancer, lung cancer, and prostate cancer. This medicine may be used for other purposes; ask your health care provider or pharmacist if you have questions. COMMON BRAND NAME(S): Docefrez, Taxotere What should I tell my health care provider before I take this medicine? They need to know if you have any of these conditions: -infection (especially a virus infection such as chickenpox, cold sores, or herpes) -liver disease -low blood counts, like low white cell, platelet, or red cell counts -an unusual or allergic reaction to docetaxel, polysorbate 80, other chemotherapy agents, other medicines, foods, dyes, or preservatives -pregnant or trying to get pregnant -breast-feeding How should I use this medicine? This drug is given as an infusion into a vein. It is administered in a hospital or clinic by a specially trained health care professional. Talk to your pediatrician regarding the use of this medicine in children. Special care may be needed. Overdosage: If you think you have taken too much  of this medicine contact a poison control center or emergency room at once. NOTE: This medicine is only for you. Do not share this medicine with others. What if I miss a dose? It is important not to miss your dose. Call your doctor or health care professional if you are unable to keep an appointment. What may interact with this medicine? -cyclosporine -erythromycin -ketoconazole -medicines to increase blood counts like filgrastim, pegfilgrastim, sargramostim -vaccines Talk to your doctor or health care professional before taking any of these medicines: -acetaminophen -aspirin -ibuprofen -ketoprofen -naproxen This list may not  describe all possible interactions. Give your health care provider a list of all the medicines, herbs, non-prescription drugs, or dietary supplements you use. Also tell them if you smoke, drink alcohol, or use illegal drugs. Some items may interact with your medicine. What should I watch for while using this medicine? Your condition will be monitored carefully while you are receiving this medicine. You will need important blood work done while you are taking this medicine. This drug may make you feel generally unwell. This is not uncommon, as chemotherapy can affect healthy cells as well as cancer cells. Report any side effects. Continue your course of treatment even though you feel ill unless your doctor tells you to stop. In some cases, you may be given additional medicines to help with side effects. Follow all directions for their use. Call your doctor or health care professional for advice if you get a fever, chills or sore throat, or other symptoms of a cold or flu. Do not treat yourself. This drug decreases your body's ability to fight infections. Try to avoid being around people who are sick. This medicine may increase your risk to bruise or bleed. Call your doctor or health care professional if you notice any unusual bleeding. This medicine may contain alcohol in the product. You may get drowsy or dizzy. Do not drive, use machinery, or do anything that needs mental alertness until you know how this medicine affects you. Do not stand or sit up quickly, especially if you are an older patient. This reduces the risk of dizzy or fainting spells. Avoid alcoholic drinks. Do not become pregnant while taking this medicine. Women should inform their doctor if they wish to become pregnant or think they might be pregnant. There is a potential for serious side effects to an unborn child. Talk to your health care professional or pharmacist for more information. Do not breast-feed an infant while taking this  medicine. What side effects may I notice from receiving this medicine? Side effects that you should report to your doctor or health care professional as soon as possible: -allergic reactions like skin rash, itching or hives, swelling of the face, lips, or tongue -low blood counts - This drug may decrease the number of white blood cells, red blood cells and platelets. You may be at increased risk for infections and bleeding. -signs of infection - fever or chills, cough, sore throat, pain or difficulty passing urine -signs of decreased platelets or bleeding - bruising, pinpoint red spots on the skin, black, tarry stools, nosebleeds -signs of decreased red blood cells - unusually weak or tired, fainting spells, lightheadedness -breathing problems -fast or irregular heartbeat -low blood pressure -mouth sores -nausea and vomiting -pain, swelling, redness or irritation at the injection site -pain, tingling, numbness in the hands or feet -swelling of the ankle, feet, hands -weight gain Side effects that usually do not require medical attention (report to your doctor or health  care professional if they continue or are bothersome): -bone pain -complete hair loss including hair on your head, underarms, pubic hair, eyebrows, and eyelashes -diarrhea -excessive tearing -changes in the color of fingernails -loosening of the fingernails -nausea -muscle pain -red flush to skin -sweating -weak or tired This list may not describe all possible side effects. Call your doctor for medical advice about side effects. You may report side effects to FDA at 1-800-FDA-1088. Where should I keep my medicine? This drug is given in a hospital or clinic and will not be stored at home. NOTE: This sheet is a summary. It may not cover all possible information. If you have questions about this medicine, talk to your doctor, pharmacist, or health care provider.  2018 Elsevier/Gold Standard (2015-08-30  12:32:56)   Carboplatin injection What is this medicine? CARBOPLATIN (KAR boe pla tin) is a chemotherapy drug. It targets fast dividing cells, like cancer cells, and causes these cells to die. This medicine is used to treat ovarian cancer and many other cancers. This medicine may be used for other purposes; ask your health care provider or pharmacist if you have questions. COMMON BRAND NAME(S): Paraplatin What should I tell my health care provider before I take this medicine? They need to know if you have any of these conditions: -blood disorders -hearing problems -kidney disease -recent or ongoing radiation therapy -an unusual or allergic reaction to carboplatin, cisplatin, other chemotherapy, other medicines, foods, dyes, or preservatives -pregnant or trying to get pregnant -breast-feeding How should I use this medicine? This drug is usually given as an infusion into a vein. It is administered in a hospital or clinic by a specially trained health care professional. Talk to your pediatrician regarding the use of this medicine in children. Special care may be needed. Overdosage: If you think you have taken too much of this medicine contact a poison control center or emergency room at once. NOTE: This medicine is only for you. Do not share this medicine with others. What if I miss a dose? It is important not to miss a dose. Call your doctor or health care professional if you are unable to keep an appointment. What may interact with this medicine? -medicines for seizures -medicines to increase blood counts like filgrastim, pegfilgrastim, sargramostim -some antibiotics like amikacin, gentamicin, neomycin, streptomycin, tobramycin -vaccines Talk to your doctor or health care professional before taking any of these medicines: -acetaminophen -aspirin -ibuprofen -ketoprofen -naproxen This list may not describe all possible interactions. Give your health care provider a list of all the  medicines, herbs, non-prescription drugs, or dietary supplements you use. Also tell them if you smoke, drink alcohol, or use illegal drugs. Some items may interact with your medicine. What should I watch for while using this medicine? Your condition will be monitored carefully while you are receiving this medicine. You will need important blood work done while you are taking this medicine. This drug may make you feel generally unwell. This is not uncommon, as chemotherapy can affect healthy cells as well as cancer cells. Report any side effects. Continue your course of treatment even though you feel ill unless your doctor tells you to stop. In some cases, you may be given additional medicines to help with side effects. Follow all directions for their use. Call your doctor or health care professional for advice if you get a fever, chills or sore throat, or other symptoms of a cold or flu. Do not treat yourself. This drug decreases your body's ability to fight  infections. Try to avoid being around people who are sick. This medicine may increase your risk to bruise or bleed. Call your doctor or health care professional if you notice any unusual bleeding. Be careful brushing and flossing your teeth or using a toothpick because you may get an infection or bleed more easily. If you have any dental work done, tell your dentist you are receiving this medicine. Avoid taking products that contain aspirin, acetaminophen, ibuprofen, naproxen, or ketoprofen unless instructed by your doctor. These medicines may hide a fever. Do not become pregnant while taking this medicine. Women should inform their doctor if they wish to become pregnant or think they might be pregnant. There is a potential for serious side effects to an unborn child. Talk to your health care professional or pharmacist for more information. Do not breast-feed an infant while taking this medicine. What side effects may I notice from receiving this  medicine? Side effects that you should report to your doctor or health care professional as soon as possible: -allergic reactions like skin rash, itching or hives, swelling of the face, lips, or tongue -signs of infection - fever or chills, cough, sore throat, pain or difficulty passing urine -signs of decreased platelets or bleeding - bruising, pinpoint red spots on the skin, black, tarry stools, nosebleeds -signs of decreased red blood cells - unusually weak or tired, fainting spells, lightheadedness -breathing problems -changes in hearing -changes in vision -chest pain -high blood pressure -low blood counts - This drug may decrease the number of white blood cells, red blood cells and platelets. You may be at increased risk for infections and bleeding. -nausea and vomiting -pain, swelling, redness or irritation at the injection site -pain, tingling, numbness in the hands or feet -problems with balance, talking, walking -trouble passing urine or change in the amount of urine Side effects that usually do not require medical attention (report to your doctor or health care professional if they continue or are bothersome): -hair loss -loss of appetite -metallic taste in the mouth or changes in taste This list may not describe all possible side effects. Call your doctor for medical advice about side effects. You may report side effects to FDA at 1-800-FDA-1088. Where should I keep my medicine? This drug is given in a hospital or clinic and will not be stored at home. NOTE: This sheet is a summary. It may not cover all possible information. If you have questions about this medicine, talk to your doctor, pharmacist, or health care provider.  2018 Elsevier/Gold Standard (2007-11-02 14:38:05)

## 2018-03-24 ENCOUNTER — Other Ambulatory Visit: Payer: Self-pay | Admitting: Surgery

## 2018-03-24 ENCOUNTER — Telehealth: Payer: Self-pay | Admitting: Nurse Practitioner

## 2018-03-24 ENCOUNTER — Ambulatory Visit (HOSPITAL_BASED_OUTPATIENT_CLINIC_OR_DEPARTMENT_OTHER): Payer: Medicaid Other | Admitting: Medical

## 2018-03-24 DIAGNOSIS — M791 Myalgia, unspecified site: Secondary | ICD-10-CM

## 2018-03-24 DIAGNOSIS — T451X5A Adverse effect of antineoplastic and immunosuppressive drugs, initial encounter: Secondary | ICD-10-CM

## 2018-03-24 DIAGNOSIS — R6889 Other general symptoms and signs: Secondary | ICD-10-CM

## 2018-03-24 DIAGNOSIS — C50911 Malignant neoplasm of unspecified site of right female breast: Secondary | ICD-10-CM

## 2018-03-24 DIAGNOSIS — Z5111 Encounter for antineoplastic chemotherapy: Secondary | ICD-10-CM | POA: Diagnosis not present

## 2018-03-24 NOTE — Telephone Encounter (Signed)
Phone call to patient to review instructions for 13 hr prep for MRI on 8/21. Pt instructed to take: 8/20 at 1800 - 50mg  Prednisone 8/21 at 0000 - 50mg  Prednisone 8/21 at 0600 - 50mg  Prednisone and 50 mg Benadryl  Prescription called into [place] outpatient pharmacy on [address]. Pt aware and verbalized understanding of instructions.

## 2018-03-25 ENCOUNTER — Telehealth: Payer: Self-pay | Admitting: Medical

## 2018-03-25 ENCOUNTER — Inpatient Hospital Stay: Payer: Medicaid Other

## 2018-03-25 DIAGNOSIS — Z171 Estrogen receptor negative status [ER-]: Secondary | ICD-10-CM

## 2018-03-25 DIAGNOSIS — Z5111 Encounter for antineoplastic chemotherapy: Secondary | ICD-10-CM | POA: Diagnosis not present

## 2018-03-25 DIAGNOSIS — C50411 Malignant neoplasm of upper-outer quadrant of right female breast: Secondary | ICD-10-CM

## 2018-03-25 MED ORDER — PEGFILGRASTIM-CBQV 6 MG/0.6ML ~~LOC~~ SOSY
PREFILLED_SYRINGE | SUBCUTANEOUS | Status: AC
  Filled 2018-03-25: qty 0.6

## 2018-03-25 MED ORDER — PEGFILGRASTIM INJECTION 6 MG/0.6ML ~~LOC~~
6.0000 mg | PREFILLED_SYRINGE | Freq: Once | SUBCUTANEOUS | Status: AC
  Administered 2018-03-25: 6 mg via SUBCUTANEOUS

## 2018-03-25 MED ORDER — PEGFILGRASTIM INJECTION 6 MG/0.6ML ~~LOC~~
PREFILLED_SYRINGE | SUBCUTANEOUS | Status: AC
  Filled 2018-03-25: qty 0.6

## 2018-03-25 MED FILL — predniSONE 10 MG TABS: 10 | 1 days supply | Qty: 15 | Fill #0

## 2018-03-25 NOTE — Telephone Encounter (Signed)
Appt added per 8/14 per 8/15 sch msg

## 2018-03-26 ENCOUNTER — Ambulatory Visit: Payer: Self-pay | Admitting: Internal Medicine

## 2018-03-26 NOTE — Progress Notes (Signed)
    DATE:   03/23/2018                                      X CHEMO/IMMUNOTHERAPY REACTION           MD:  Dr. Jana Hakim    AGENT/BLOOD PRODUCT RECEIVING TODAY:               TCH-P   AGENT/BLOOD PRODUCT RECEIVING IMMEDIATELY PRIOR TO REACTION:           Perjeta (Perjeta infusion had been completed.)   VS: BP:      103/73   P:        61       SPO2:        100% on room air  T:  98.7                BP:      113/95   P:        61          REACTION(S):            Rigors and myalgias   PREMEDS:      Tylenol and Benadryl   INTERVENTION: Demerol 12.5 mg IV x1 and Pepcid 20 mg IV x1   Review of Systems  Review of Systems  Constitutional: Negative for chills, diaphoresis and fever.       Rigors  HENT: Negative for trouble swallowing and voice change.   Respiratory: Negative for cough, chest tightness, shortness of breath and wheezing.   Cardiovascular: Negative for chest pain and palpitations.  Gastrointestinal: Negative for abdominal pain, constipation, diarrhea, nausea and vomiting.  Musculoskeletal: Positive for myalgias. Negative for back pain.  Neurological: Negative for dizziness, light-headedness and headaches.     Physical Exam  Physical Exam  Constitutional:  The patient is an adult female who is having rigors  HENT:  Head: Normocephalic and atraumatic.  Cardiovascular: Normal rate, regular rhythm and normal heart sounds. Exam reveals no gallop and no friction rub.  No murmur heard. Pulmonary/Chest: Effort normal and breath sounds normal. No respiratory distress. She has no wheezes. She has no rales.  Neurological: She is alert.  Skin: Skin is warm and dry. No rash noted. She is not diaphoretic. No erythema.    OUTCOME:                 The patient's rigors and myalgias resolved.  She was able to proceed and complete dosing with her trastuzumab.   Sandi Mealy, MHS, PA-C

## 2018-03-29 ENCOUNTER — Encounter (HOSPITAL_COMMUNITY): Payer: Self-pay | Admitting: *Deleted

## 2018-03-29 MED FILL — NAPROXEN 500 MG TABLET: 500 | 30 days supply | Qty: 60 | Fill #3

## 2018-03-30 NOTE — Progress Notes (Signed)
[facility]  [phone] [phone]     ID: Jacqueline Good DOB: September 18, 1960  MR#: [id number]  CXK#:[id number]  Patient Care Team: Tresa Garter, MD as PCP - General (Internal Medicine) Alphonsa Overall, MD as Consulting Physician (General Surgery) Sherrick Araki, [name], MD as Consulting Physician (Oncology) Eppie Gibson, MD as Attending Physician (Radiation Oncology) OTHER MD: Donnal Moat, PA-C [FAX [phone]]  CHIEF COMPLAINT: Estrogen receptor negative breast cancer  CURRENT TREATMENT: Neoadjuvant chemoimmunotherapy  INTERVAL HISTORY: Jacqueline Good returns today for follow up of her estrogen receptor negative but HER-2 positive breast cancer accompanied by her mother and brother.  The patient is receiving neoadjuvant chemoimmunotherapy with carboplatin, docetaxel, trastuzumab, and pertuzumab every 21 days with Neulasta support on day 3. Today is day 9 cycle 1.    She tolerated the chemotherapy well, with no problems on days 1 and 2. She notes that when she received the Neulasta shot on day 3, she felt flu symptoms, body aches, and malaise. She took tylenol BID and BC s, which helped. She began to have nausea on day 3 that lasted several days, but she denies vomiting. She denies rash or mouth sores.     She had anxiety and paced the floors, despite being fatigued. She felt uneasy and has trouble sleeping at night. She has also had significant issues with diarrhea. She had 8 episodes of diarrhea yesterday, but it was not voluminous. She started Imodium today.    REVIEW OF SYSTEMS: She denies unusual headaches, visual changes, vomiting, or dizziness. There has been no unusual cough, phlegm production, or pleurisy. There has been no change in bowel or bladder habits. She denies unexplained fatigue or unexplained weight loss, bleeding, rash, or fever. A detailed review of systems was otherwise stable.    HISTORY OF CURRENT ILLNESS: From the original  intake note:  Jacqueline Good had routine screening mammography on 02/05/2018 showing a possible abnormality in the right breast. She underwent unilateral right diagnostic mammography with tomography and right breast ultrasonography at [facility] on 02/15/2018 showing: Highly suspicious right breast mass at 10 o'clock position.  There was a second, 0.6 cm lesion at the 10:00 radiant which has not been biopsied.  Suspicious focal cortical thickening of a single right axillary lymph node.  Accordingly on 02/25/2018 she proceeded to biopsy of the right breast area in question. The pathology from this procedure showed ([id number]): Breast, right, needle core biopsy, OUQ with microscopic focus of invasive ductal carcinoma, grade 2, arising in a background of high grade ductal carcinoma in situ. 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   The patient's subsequent history is as detailed below.    PAST MEDICAL HISTORY: Past Medical History:  Diagnosis Date  . Arthritis   . Asthma   . breast ca dx'd 02/2018   right  . Depression     PAST SURGICAL HISTORY: Past Surgical History:  Procedure Laterality Date  . endometrial ablation    . IR IMAGING GUIDED PORT INSERTION  03/18/2018  . MYOMECTOMY      FAMILY HISTORY Family History  Problem Relation Age of Onset  . Lupus Sister   . Heart disease Sister   . Breast cancer Mother    She notes that her father is currently 65 years old. Patients' mother is currently 7 years old and she was diagnosed with breast cancer at age 71. The patient has 4 brothers and 1 sister.  She has a maternal aunt with breast cancer and her maternal great grandmother with had ovarian cancer.    GYNECOLOGIC HISTORY:  No LMP recorded. Patient is postmenopausal. Menarche: 57 years old Age at first live birth: 57 years old [place] P1 LMP: 13-14 years ago s/p ablation Contraceptive: no HRT: no  Hysterectomy:  no SO: no  SOCIAL HISTORY: She is currently unemployed.  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 She lives alone without pets. Her daughter is Jacqueline Good who lives in [place] and works in Therapist, art.  The patient has one grandchild. She goes to a [employer].      ADVANCED DIRECTIVES: Not in place.  At the 03/03/2018 visit the patient was given the appropriate documents to complete and notarized at her discretion   HEALTH MAINTENANCE: Social History   Tobacco Use  . Smoking status: Current Every Day Smoker    Packs/day: 1.00    Years: 20.00    Pack years: 20.00    Types: Cigarettes  . Smokeless tobacco: Never Used  Substance Use Topics  . Alcohol use: Yes    Comment: occasionally  . Drug use: No     Colonoscopy: Never  PAP:   Bone density: Never   Allergies  Allergen Reactions  . Gadolinium Derivatives Itching and Cough    Pt began sneezing and coughing as soon as MRI contrast was injected. Severe nasal congestion. No rash or hives no SOB.     Current Outpatient Medications  Medication Sig Dispense Refill  . albuterol (VENTOLIN HFA) 108 (90 Base) MCG/ACT inhaler INHALE 2 PUFFS INTO THE LUNGS EVERY 6 HOURS AS NEEDED FOR WHEEZING 54 g 3  . ALPRAZolam (XANAX) 0.5 MG tablet Take 1 tablet (0.5 mg total) by mouth 2 (two) times daily as needed for anxiety. 60 tablet 0  . BusPIRone HCl (BUSPAR PO) Take by mouth.    . cetirizine-pseudoephedrine (ZYRTEC-D) 5-120 MG tablet Take 1 tablet by mouth daily. 15 tablet 0  . dexamethasone (DECADRON) 4 MG tablet Take 2 tablets (8 mg total) by mouth 2 (two) times daily. Start the day before Taxotere. Take once the day after, then 2 times a day x 2d. 30 tablet 1  . DULoxetine HCl (CYMBALTA PO) Take by mouth.    . esomeprazole (NEXIUM) 40 MG capsule Take 1 capsule (40 mg total) by mouth daily. 30 capsule 5  . fluticasone (FLONASE) 50 MCG/ACT nasal spray Place 2 sprays into both nostrils daily. 1 g 0  .  Fluticasone-Salmeterol (ADVAIR DISKUS) 250-50 MCG/DOSE AEPB INHALE 1 PUFF INTO THE LUNGS 2 TIMES DAILY. (Patient not taking: Reported on 03/03/2018) 60 each 2  . lidocaine-prilocaine (EMLA) cream Apply to affected area once 30 g 3  . LORazepam (ATIVAN) 0.5 MG tablet Take 1 tablet (0.5 mg total) by mouth at bedtime as needed (Nausea or vomiting). 30 tablet 0  . Melatonin 5 MG TABS Take 1 tablet by mouth at bedtime.    . naproxen (NAPROSYN) 500 MG tablet Take 1 tablet (500 mg total) by mouth 2 (two) times daily with a meal. 60 tablet 3  . prochlorperazine (COMPAZINE) 10 MG tablet Take 1 tablet (10 mg total) by mouth every 6 (six) hours as needed (Nausea or vomiting). 30 tablet 1   No current facility-administered medications for this visit.     OBJECTIVE: Middle-aged African-American woman in no acute distress  Vitals:   03/31/18 1224  BP: 124/86  Pulse: 77  Resp: 18  Temp: 98.1 F (36.7  C)  SpO2: 96%     Body mass index is 34.58 kg/m.   Wt Readings from Last 3 Encounters:  03/31/18 207 lb 12.8 oz (94.3 kg)  03/18/18 212 lb 9.6 oz (96.4 kg)  03/18/18 213 lb 9.6 oz (96.9 kg)      ECOG FS:1 - Symptomatic but completely ambulatory Sclerae unicteric, EOMs intact Oropharynx clear and moist No cervical or supraclavicular adenopathy Lungs no rales or rhonchi Heart regular rate and rhythm Abd soft, nontender, positive bowel sounds MSK no focal spinal tenderness, no upper extremity lymphedema Neuro: nonfocal, well oriented, appropriate affect Breasts: Deferred   LAB RESULTS:  CMP     Component Value Date/Time   NA 140 03/31/2018 1200   K 3.3 (L) 03/31/2018 1200   CL 103 03/31/2018 1200   CO2 24 03/31/2018 1200   GLUCOSE 149 (H) 03/31/2018 1200   BUN 26 (H) 03/31/2018 1200   CREATININE 1.64 (H) 03/31/2018 1200   CREATININE 0.80 03/03/2018 0830   CREATININE 0.83 09/24/2016 1151   CALCIUM 9.6 03/31/2018 1200   PROT 7.9 03/31/2018 1200   ALBUMIN 4.3 03/31/2018 1200   AST 16  03/31/2018 1200   AST 15 03/03/2018 0830   ALT 33 03/31/2018 1200   ALT 16 03/03/2018 0830   ALKPHOS 148 (H) 03/31/2018 1200   BILITOT 0.2 (L) 03/31/2018 1200   BILITOT 0.5 03/03/2018 0830   GFRNONAA 34 (L) 03/31/2018 1200   GFRNONAA >60 03/03/2018 0830   GFRNONAA 79 09/24/2016 1151   GFRAA 39 (L) 03/31/2018 1200   GFRAA >60 03/03/2018 0830   GFRAA >89 09/24/2016 1151    No results found for: TOTALPROTELP, ALBUMINELP, A1GS, A2GS, BETS, BETA2SER, GAMS, MSPIKE, SPEI  No results found for: KPAFRELGTCHN, LAMBDASER, KAPLAMBRATIO  Lab Results  Component Value Date   WBC 35.7 (H) 03/31/2018   NEUTROABS 33.5 (H) 03/31/2018   HGB 13.3 03/31/2018   HCT 41.4 03/31/2018   MCV 87.9 03/31/2018   PLT 202 03/31/2018    _0 @  No results found for: LABCA2  No components found for: OVFIEP329  No results for input(s): INR in the last 168 hours.  No results found for: LABCA2  No results found for: JJO841  No results found for: YSA630  No results found for: ZSW109  No results found for: CA2729  No components found for: HGQUANT  No results found for: CEA1 / No results found for: CEA1   No results found for: AFPTUMOR  No results found for: CHROMOGRNA  No results found for: PSA1  Appointment on 03/31/2018  Component Date Value Ref Range Status  . WBC 03/31/2018 35.7* 3.9 - 10.3 K/uL Final  . RBC 03/31/2018 4.71  3.70 - 5.45 MIL/uL Final  . Hemoglobin 03/31/2018 13.3  11.6 - 15.9 g/dL Final  . HCT 03/31/2018 41.4  34.8 - 46.6 % Final  . MCV 03/31/2018 87.9  79.5 - 101.0 fL Final  . MCH 03/31/2018 28.1  25.1 - 34.0 pg Final  . MCHC 03/31/2018 32.0  31.5 - 36.0 g/dL Final  . RDW 03/31/2018 13.8  11.2 - 14.5 % Final  . Platelets 03/31/2018 202  145 - 400 K/uL Final  . Neutrophils Relative % 03/31/2018 94  % Final  . Neutro Abs 03/31/2018 33.5* 1.5 - 6.5 K/uL Final  . Lymphocytes Relative 03/31/2018 4  % Final  . Lymphs Abs 03/31/2018 1.5  0.9 - 3.3 K/uL Final  .  Monocytes Relative 03/31/2018 2  % Final  . Monocytes Absolute 03/31/2018 0.7  0.1 - 0.9 K/uL Final  . Eosinophils Relative 03/31/2018 0  % Final  . Eosinophils Absolute 03/31/2018 0.0  0.0 - 0.5 K/uL Final  . Basophils Relative 03/31/2018 0  % Final  . Basophils Absolute 03/31/2018 0.0  0.0 - 0.1 K/uL Final   Performed at [facility], [place] [address], [place], [place] [address]  . Sodium 03/31/2018 140  135 - 145 mmol/L Final  . Potassium 03/31/2018 3.3* 3.5 - 5.1 mmol/L Final  . Chloride 03/31/2018 103  98 - 111 mmol/L Final  . CO2 03/31/2018 24  22 - 32 mmol/L Final  . Glucose, Bld 03/31/2018 149* 70 - 99 mg/dL Final  . BUN 03/31/2018 26* 6 - 20 mg/dL Final  . Creatinine, Ser 03/31/2018 1.64* 0.44 - 1.00 mg/dL Final  . Calcium 03/31/2018 9.6  8.9 - 10.3 mg/dL Final  . Total Protein 03/31/2018 7.9  6.5 - 8.1 g/dL Final  . Albumin 03/31/2018 4.3  3.5 - 5.0 g/dL Final  . AST 03/31/2018 16  15 - 41 U/L Final  . ALT 03/31/2018 33  0 - 44 U/L Final  . Alkaline Phosphatase 03/31/2018 148* 38 - 126 U/L Final  . Total Bilirubin 03/31/2018 0.2* 0.3 - 1.2 mg/dL Final  . GFR calc non Af Amer 03/31/2018 34* >60 mL/min Final  . GFR calc Af Amer 03/31/2018 39* >60 mL/min Final   Comment: (NOTE) The eGFR has been calculated using the CKD EPI equation. This calculation has not been validated in all clinical situations. eGFR's persistently <60 mL/min signify possible Chronic Kidney Disease.   Georgiann Hahn gap 03/31/2018 13  5 - 15 Final   Performed at [facility], [address] [place], [place] [address]    (this displays the last labs from the last 3 days)  No results found for: TOTALPROTELP, ALBUMINELP, A1GS, A2GS, BETS, BETA2SER, GAMS, MSPIKE, SPEI (this displays SPEP labs)  No results found for: KPAFRELGTCHN, LAMBDASER, KAPLAMBRATIO (kappa/lambda light chains)  No results found for: HGBA, HGBA2QUANT, HGBFQUANT, HGBSQUAN (Hemoglobinopathy  evaluation)   No results found for: LDH  No results found for: IRON, TIBC, IRONPCTSAT (Iron and TIBC)  No results found for: FERRITIN  Urinalysis    Component Value Date/Time   COLORURINE CANCELED 09/24/2016 1151   APPEARANCEUR CANCELED 09/24/2016 1151   LABSPEC CANCELED 09/24/2016 1151   PHURINE CANCELED 09/24/2016 1151   GLUCOSEU CANCELED 09/24/2016 1151   HGBUR CANCELED 09/24/2016 1151   BILIRUBINUR CANCELED 09/24/2016 1151   KETONESUR CANCELED 09/24/2016 1151   PROTEINUR CANCELED 09/24/2016 1151   NITRITE CANCELED 09/24/2016 1151   LEUKOCYTESUR CANCELED 09/24/2016 1151     STUDIES:  Ct Chest W Contrast  Result Date: 03/16/2018 CLINICAL DATA:  New diagnosis of invasive right breast cancer. EXAM: CT CHEST WITH CONTRAST TECHNIQUE: Multidetector CT imaging of the chest was performed during intravenous contrast administration. CONTRAST:  44m OMNIPAQUE IOHEXOL 300 MG/ML  SOLN COMPARISON:  Radiographs 11/15/2009. FINDINGS: Cardiovascular: No significant vascular findings. There is limited opacification of the pulmonary arteries. Probable faint coronary artery calcifications. The heart size is normal. There is no pericardial effusion. Mediastinum/Nodes: There are no enlarged mediastinal, hilar, axillary or internal mammary lymph nodes. The thyroid gland and trachea appear normal. There is a small hiatal hernia. Lungs/Pleura: There is no pleural effusion. The lungs are clear. Upper abdomen: Subjective mild hepatic steatosis without focal abnormality. There is a nonspecific 2.9 x 2.4 cm left adrenal nodule measuring 32 HU. Musculoskeletal/Chest wall: Ill-defined right breast  mass noted with associated central biopsy clip. No suspicious osseous findings. IMPRESSION: 1. No evidence of thoracic metastatic disease. 2. Indeterminate left adrenal nodule. Consider further characterization with noncontrast CT through the adrenal glands. 3. Subjective mild hepatic steatosis. 4. Known right breast mass,  biopsy proven malignancy. Electronically Signed   By: Richardean Sale M.D.   On: 03/16/2018 17:27   Nm Bone Scan Whole Body  Result Date: 03/16/2018 CLINICAL DATA:  New diagnosis of invasive right breast cancer. EXAM: NUCLEAR MEDICINE WHOLE BODY BONE SCAN TECHNIQUE: Whole body anterior and posterior images were obtained approximately 3 hours after intravenous injection of radiopharmaceutical. RADIOPHARMACEUTICALS:  20.0 mCi Technetium-78mMDP IV COMPARISON:  Chest CT same date. FINDINGS: There is no osseous uptake suspicious for metastatic disease. There is mild generalized calvarial uptake consistent with hyperostosis. There is multifocal large joint activity involving the elbows, wrists, knees and feet consistent with arthropathy. The soft tissue activity appears unremarkable aside from mild urine contamination in the perineum. IMPRESSION: No evidence of osseous metastatic disease. Electronically Signed   By: WRichardean SaleM.D.   On: 03/16/2018 17:49   Mr Breast Bilateral W WEast SandwichCad  Result Date: 03/12/2018 CLINICAL DATA:  Ultrasound-guided core biopsy of mass the 10 o'clock location of the RIGHT breast revealed invasive ductal carcinoma in a background of high-grade ductal carcinoma in situ. Ultrasound-guided core biopsy of an enlarged RIGHT axillary lymph node demonstrated metastatic breast carcinoma. LABS:  None EXAM: BILATERAL BREAST MRI WITH AND WITHOUT CONTRAST TECHNIQUE: Multiplanar, multisequence MR images of both breasts were obtained prior to and following the intravenous administration of 19 ml of MultiHance. Following the contrast injection, patient experienced sneezing and coughing due to contrast allergy. THREE-DIMENSIONAL MR IMAGE RENDERING ON INDEPENDENT WORKSTATION: Three-dimensional MR images were rendered by post-processing of the original MR data on an independent workstation. The three-dimensional MR images were interpreted, and findings are reported in the following  complete MRI report for this study. Three dimensional images were evaluated at the independent DynaCad workstation COMPARISON:  02/25/2018 and earlier FINDINGS: Breast composition: c. Heterogeneous fibroglandular tissue. Background parenchymal enhancement: Moderate. Right breast: Within the LATERAL aspect of the RIGHT breast there is an enhancing mass measuring 2.4 x 2.4x 2.4 centimeters. Mass demonstrates plateau type enhancement kinetics and contains a clip from recent ultrasound-guided core biopsy. There is non mass enhancement surrounding this lesion, estimated to be at least a total of 8.9 (anterior posterior) x 5.4 (superior to inferior) centimeters. This enhancement extends approximately 2.4 centimeters anterior to the mass and 4.6 centimeters posterior to the mass. Left breast: No mass or abnormal enhancement. Lymph nodes: Patient had an ultrasound-guided core biopsy of an enlarged RIGHT axillary lymph node. In addition to this abnormal lymph node, there are at least 5 small rounded lymph nodes including intramammary lymph nodes and LOWER axillary lymph nodes which are suspicious based on their morphology. The evaluation of the RIGHT axilla is limited due to significant motion on STIR sequences. No suspicious internal mammary lymph nodes. LEFT axilla is negative. Ancillary findings:  None. IMPRESSION: 1. In addition to the known cancer in the LATERAL portion of the RIGHT breast, there is additional non mass enhancement extending anterior and posterior to this mass, suspicious for additional DCIS, measuring a total of 8.9 centimeters in anterior-posterior extent. 2. Additional enlarged RIGHT axillary and intramammary lymph nodes. 3. LEFT breast is negative. RECOMMENDATION: If the patient is a candidate for breast conservation, additional biopsies of the RIGHT breast are indicated. The patient has  a possible allergy to MultiHance gadolinium contrast agent and will need 13 hour premedication prior to MR guided  core biopsies. BI-RADS CATEGORY  4: Suspicious. Electronically Signed   By: Nolon Nations M.D.   On: 03/12/2018 15:56   Mm Clip Placement Right  Result Date: 03/31/2018 CLINICAL DATA:  Biopsy proven invasive mammary carcinoma and ductal carcinoma in-situ in the 10 o'clock region of the right breast. MRI showed additional non masslike enhancement in the right breast. MR guided core biopsies performed for extent of disease. EXAM: DIAGNOSTIC RIGHT MAMMOGRAM POST MRI BIOPSY COMPARISON:  Previous exam(s). FINDINGS: Mammographic images were obtained following MR guided biopsies of the right breast. Mammographic images show there is a dumbbell-shaped clip in the posterior third of the upper-outer quadrant of the right breast. It is located 3.2 cm posterior to the ribbon shaped clip (biopsy proven invasive mammary carcinoma and ductal carcinoma in-situ). There is a cylindrical shaped clip located in the anterior third of the upper-outer quadrant of the right breast 3.8 cm anterior to the ribbon shaped clip. IMPRESSION: Status post MR guided core biopsies of the right breast with pathology pending. Final Assessment: Post Procedure Mammograms for Marker Placement Electronically Signed   By: Lillia Mountain M.D.   On: 03/31/2018 10:09   Mr Rt Breast Bx Johnella Moloney Dev 1st Lesion Image Bx Spec Mr Guide  Result Date: 03/31/2018 CLINICAL DATA:  Biopsy proven invasive ductal carcinoma and high-grade ductal carcinoma in-situ in the 10 o'clock region of the right breast. MRI showed additional non masslike enhancement extending anterior and posterior to the mass. MR guided core biopsies of the anterior and posterior enhancement requested. EXAM: MRI GUIDED CORE NEEDLE BIOPSY OF THE RIGHT BREAST TECHNIQUE: Multiplanar, multisequence MR imaging of the right breast was performed both before and after administration of intravenous contrast. CONTRAST:  28m MULTIHANCE GADOBENATE DIMEGLUMINE 529 MG/ML IV SOLN COMPARISON:  Previous exams.  FINDINGS: I met with the patient, and we discussed the procedure of MRI guided biopsy, including risks, benefits, and alternatives. Specifically, we discussed the risks of infection, bleeding, tissue injury, clip migration, and inadequate sampling. Informed, written consent was given. The usual time out protocol was performed immediately prior to the procedure. Using sterile technique, 1% Lidocaine, MRI guidance, and a 9 gauge vacuum assisted device, biopsy was performed of posterior enhancement in the upper-outer quadrant using a lateral to medial approach. At the conclusion of the procedure, a dumbbell-shaped tissue marker clip was deployed into the biopsy cavity. Follow-up 2-view mammogram was performed and dictated separately. I met with the patient, and we discussed the procedure of MRI guided biopsy, including risks, benefits, and alternatives. Specifically, we discussed the risks of infection, bleeding, tissue injury, clip migration, and inadequate sampling. Informed, written consent was given. The usual time out protocol was performed immediately prior to the procedure. Using sterile technique, 1% Lidocaine, MRI guidance, and a 9 gauge vacuum assisted device, biopsy was performed of anterior enhancement in the upper-outer quadrant using a lateral to medial approach. At the conclusion of the procedure, a cylindrical shaped tissue marker clip was deployed into the biopsy cavity. Follow-up 2-view mammogram was performed and dictated separately. IMPRESSION: MRI guided biopsies of the right breast.  No apparent complications. Electronically Signed   By: DLillia MountainM.D.   On: 03/31/2018 10:04   Mr Rt Breast Bx W Loc Dev Ea Add Lesion Image Bx Spec Mr Guide  Result Date: 03/31/2018 CLINICAL DATA:  Biopsy proven invasive ductal carcinoma and high-grade ductal carcinoma  in-situ in the 10 o'clock region of the right breast. MRI showed additional non masslike enhancement extending anterior and posterior to the  mass. MR guided core biopsies of the anterior and posterior enhancement requested. EXAM: MRI GUIDED CORE NEEDLE BIOPSY OF THE RIGHT BREAST TECHNIQUE: Multiplanar, multisequence MR imaging of the right breast was performed both before and after administration of intravenous contrast. CONTRAST:  71m MULTIHANCE GADOBENATE DIMEGLUMINE 529 MG/ML IV SOLN COMPARISON:  Previous exams. FINDINGS: I met with the patient, and we discussed the procedure of MRI guided biopsy, including risks, benefits, and alternatives. Specifically, we discussed the risks of infection, bleeding, tissue injury, clip migration, and inadequate sampling. Informed, written consent was given. The usual time out protocol was performed immediately prior to the procedure. Using sterile technique, 1% Lidocaine, MRI guidance, and a 9 gauge vacuum assisted device, biopsy was performed of posterior enhancement in the upper-outer quadrant using a lateral to medial approach. At the conclusion of the procedure, a dumbbell-shaped tissue marker clip was deployed into the biopsy cavity. Follow-up 2-view mammogram was performed and dictated separately. I met with the patient, and we discussed the procedure of MRI guided biopsy, including risks, benefits, and alternatives. Specifically, we discussed the risks of infection, bleeding, tissue injury, clip migration, and inadequate sampling. Informed, written consent was given. The usual time out protocol was performed immediately prior to the procedure. Using sterile technique, 1% Lidocaine, MRI guidance, and a 9 gauge vacuum assisted device, biopsy was performed of anterior enhancement in the upper-outer quadrant using a lateral to medial approach. At the conclusion of the procedure, a cylindrical shaped tissue marker clip was deployed into the biopsy cavity. Follow-up 2-view mammogram was performed and dictated separately. IMPRESSION: MRI guided biopsies of the right breast.  No apparent complications.  Electronically Signed   By: DLillia MountainM.D.   On: 03/31/2018 10:04   Ir Imaging Guided Port Insertion  Result Date: 03/18/2018 CLINICAL DATA:  Right breast carcinoma metastatic to right axillary lymph node and need for porta cath to begin chemotherapy. EXAM: IMPLANTED PORT A CATH PLACEMENT WITH ULTRASOUND AND FLUOROSCOPIC GUIDANCE ANESTHESIA/SEDATION: 4.0 mg IV Versed; 150 mcg IV Fentanyl Total Moderate Sedation Time:  40 minutes The patient's level of consciousness and physiologic status were continuously monitored during the procedure by Radiology nursing. Additional Medications: 2 g IV Ancef. FLUOROSCOPY TIME:  36 seconds.  6.7 mGy. PROCEDURE: The procedure, risks, benefits, and alternatives were explained to the patient. Questions regarding the procedure were encouraged and answered. The patient understands and consents to the procedure. A time-out was performed prior to initiating the procedure. Ultrasound was utilized to confirm patency of the left internal jugular vein. The left neck and chest were prepped with chlorhexidine in a sterile fashion, and a sterile drape was applied covering the operative field. Maximum barrier sterile technique with sterile gowns and gloves were used for the procedure. Local anesthesia was provided with 1% lidocaine. After creating a small venotomy incision, a 21 gauge needle was advanced into the left internal jugular vein under direct, real-time ultrasound guidance. Ultrasound image documentation was performed. After securing guidewire access, an 8 Fr dilator was placed. A J-wire was kinked to measure appropriate catheter length. A subcutaneous port pocket was then created along the upper chest wall utilizing sharp and blunt dissection. Portable cautery was utilized. The pocket was irrigated with sterile saline. A single lumen power injectable port was chosen for placement. The 8 Fr catheter was tunneled from the port pocket site to the venotomy  incision. The port was  placed in the pocket. External catheter was trimmed to appropriate length based on guidewire measurement. At the venotomy, an 8 Fr peel-away sheath was placed over a guidewire. The catheter was then placed through the sheath and the sheath removed. Final catheter positioning was confirmed and documented with a fluoroscopic spot image. The port was accessed with a needle and aspirated and flushed with heparinized saline. The access needle was removed. The venotomy and port pocket incisions were closed with subcutaneous 3-0 Monocryl and subcuticular 4-0 Vicryl. Dermabond was applied to both incisions. COMPLICATIONS: COMPLICATIONS None FINDINGS: After catheter placement, the tip lies at the cavo-atrial junction. The catheter aspirates normally and is ready for immediate use. IMPRESSION: Placement of single lumen port a cath via left internal jugular vein. The catheter tip lies at the cavo-atrial junction. A power injectable port a cath was placed and is ready for immediate use. Electronically Signed   By: Aletta Edouard M.D.   On: 03/18/2018 17:08    ELIGIBLE FOR AVAILABLE RESEARCH PROTOCOL:BCEP  ASSESSMENT: 57 y.o. [place] woman status post right breast upper outer quadrant biopsy 02/25/2018 for a clinical T2 pN1, stage IIB invasive ductal carcinoma, estrogen and progesterone receptor negative, HER-2 amplified, with an MIB-1 of 50%.  (a) additional MRI biopsy 03/31/2018 showed  (1) genetics testing pending  (2) neoadjuvant chemotherapy will consist of carboplatin, docetaxel, trastuzumab and Pertuzumab every 21 days x 6 starting 03/23/2018  (a) epratuzumab omitted from cycle 2  (3) anti-HER-2 treatment will continue for a minimum of 6 months  (a) baseline echocardiogram 03/16/2018 shows an ejection fraction in the 55-60% range.  (4) definitive surgery with targeted axillary dissection to follow  (5) adjuvant radiation  (6) left adrenal adenoma measuring 2.9 cm on CT of the chest  03/16/2018  PLAN: I spent approximately 30 minutes face to face with Jacqueline Good with more than 50% of that time spent in counseling and coordination of care. Jacqueline Good did generally well with her chemotherapy.  In particular she has had a very nice response to the Neulasta and has excellent counts today, which should be her nadir time point.  She had some nausea, although no vomiting.  She tells me she followed her nausea medication instructions closely.  She tolerated the Neulasta very poorly.  Unfortunately we cannot eliminate this.  I am hopeful that with the second cycle the reaction will be less marked.  She had significant diarrhea, and this is very bothersome for her.  It is causing her some dehydration.  I am going to omit the Perjeta from cycle 2 and consider restarting it at a lower dose with cycle 3.  She had a biopsy today in her breast.  If that is positive it may be very difficult for her to keep her breast.  She needs a mastectomy of course she will want to meet with plastic surgery and she is considering in that case having bilateral mastectomies assuming insurance would pay for the reconstruction on the contralateral side.  She has not made up her mind regarding this though and will wait on Dr. Pollie Friar decision regarding surgical options  She is already scheduled to return on cycle 2 and she has a visit on that day as well.  She knows to call for any other problems that may develop before the next visit.   Jacqueline Good, [name], MD  03/31/18 12:56 PM Medical Oncology and Hematology [facility] [address] [place],  [address] Tel. [phone]  Fax. [phone]  I, Sheron Nightingale, am acting as scribe for Chauncey Cruel MD.  I, Lurline Del MD, have reviewed the above documentation for accuracy and completeness, and I agree with the above.

## 2018-03-31 ENCOUNTER — Ambulatory Visit
Admission: RE | Admit: 2018-03-31 | Discharge: 2018-03-31 | Disposition: A | Discharge status: Home / Self Care | Payer: No Typology Code available for payment source | Source: Ambulatory Visit | Attending: Surgery | Admitting: Surgery

## 2018-03-31 ENCOUNTER — Telehealth: Payer: Self-pay

## 2018-03-31 ENCOUNTER — Ambulatory Visit
Admission: RE | Admit: 2018-03-31 | Discharge: 2018-03-31 | Disposition: A | Discharge status: Home / Self Care | Payer: Self-pay | Source: Ambulatory Visit | Attending: Surgery | Admitting: Surgery

## 2018-03-31 ENCOUNTER — Inpatient Hospital Stay (HOSPITAL_BASED_OUTPATIENT_CLINIC_OR_DEPARTMENT_OTHER): Payer: Medicaid Other | Admitting: Oncology

## 2018-03-31 ENCOUNTER — Inpatient Hospital Stay: Payer: Medicaid Other

## 2018-03-31 VITALS — BP 124/86 | HR 77 | Temp 98.1°F | Resp 18 | Ht 65.0 in | Wt 207.8 lb

## 2018-03-31 DIAGNOSIS — C50411 Malignant neoplasm of upper-outer quadrant of right female breast: Secondary | ICD-10-CM

## 2018-03-31 DIAGNOSIS — D3502 Benign neoplasm of left adrenal gland: Secondary | ICD-10-CM

## 2018-03-31 DIAGNOSIS — E860 Dehydration: Secondary | ICD-10-CM

## 2018-03-31 DIAGNOSIS — C50911 Malignant neoplasm of unspecified site of right female breast: Secondary | ICD-10-CM

## 2018-03-31 DIAGNOSIS — R110 Nausea: Secondary | ICD-10-CM

## 2018-03-31 DIAGNOSIS — J45901 Unspecified asthma with (acute) exacerbation: Secondary | ICD-10-CM

## 2018-03-31 DIAGNOSIS — R197 Diarrhea, unspecified: Secondary | ICD-10-CM

## 2018-03-31 DIAGNOSIS — F17210 Nicotine dependence, cigarettes, uncomplicated: Secondary | ICD-10-CM

## 2018-03-31 DIAGNOSIS — Z171 Estrogen receptor negative status [ER-]: Secondary | ICD-10-CM

## 2018-03-31 DIAGNOSIS — Z5111 Encounter for antineoplastic chemotherapy: Secondary | ICD-10-CM | POA: Diagnosis not present

## 2018-03-31 DIAGNOSIS — J441 Chronic obstructive pulmonary disease with (acute) exacerbation: Secondary | ICD-10-CM

## 2018-03-31 LAB — COMPREHENSIVE METABOLIC PANEL
ALK PHOS: 148 U/L — AB (ref 38–126)
ALT: 33 U/L (ref 0–44)
AST: 16 U/L (ref 15–41)
Albumin: 4.3 g/dL (ref 3.5–5.0)
Anion gap: 13 (ref 5–15)
BILIRUBIN TOTAL: 0.2 mg/dL — AB (ref 0.3–1.2)
BUN: 26 mg/dL — ABNORMAL HIGH (ref 6–20)
CALCIUM: 9.6 mg/dL (ref 8.9–10.3)
CO2: 24 mmol/L (ref 22–32)
CREATININE: 1.64 mg/dL — AB (ref 0.44–1.00)
Chloride: 103 mmol/L (ref 98–111)
GFR calc non Af Amer: 34 mL/min — ABNORMAL LOW (ref >60)
GFR, EST AFRICAN AMERICAN: 39 mL/min — AB (ref >60)
Glucose, Bld: 149 mg/dL — ABNORMAL HIGH (ref 70–99)
Potassium: 3.3 mmol/L — ABNORMAL LOW (ref 3.5–5.1)
Sodium: 140 mmol/L (ref 135–145)
TOTAL PROTEIN: 7.9 g/dL (ref 6.5–8.1)

## 2018-03-31 LAB — CBC WITH DIFFERENTIAL/PLATELET
Basophils Absolute: 0 10*3/uL (ref 0.0–0.1)
Basophils Relative: 0 %
Eosinophils Absolute: 0 10*3/uL (ref 0.0–0.5)
Eosinophils Relative: 0 %
HCT: 41.4 % (ref 34.8–46.6)
Hemoglobin: 13.3 g/dL (ref 11.6–15.9)
LYMPHS ABS: 1.5 10*3/uL (ref 0.9–3.3)
Lymphocytes Relative: 4 %
MCH: 28.1 pg (ref 25.1–34.0)
MCHC: 32 g/dL (ref 31.5–36.0)
MCV: 87.9 fL (ref 79.5–101.0)
MONOS PCT: 2 %
Monocytes Absolute: 0.7 10*3/uL (ref 0.1–0.9)
NEUTROS PCT: 94 %
Neutro Abs: 33.5 10*3/uL — ABNORMAL HIGH (ref 1.5–6.5)
Platelets: 202 10*3/uL (ref 145–400)
RBC: 4.71 MIL/uL (ref 3.70–5.45)
RDW: 13.8 % (ref 11.2–14.5)
WBC: 35.7 10*3/uL — AB (ref 3.9–10.3)

## 2018-03-31 MED ORDER — GADOBENATE DIMEGLUMINE 529 MG/ML IV SOLN
19.0000 mL | Freq: Once | INTRAVENOUS | Status: AC | PRN
  Administered 2018-03-31: 19 mL via INTRAVENOUS

## 2018-03-31 NOTE — Telephone Encounter (Signed)
Returned patient call to verify her appointment. Per 8/21 voice mail

## 2018-04-02 ENCOUNTER — Other Ambulatory Visit: Payer: Self-pay | Admitting: Oncology

## 2018-04-05 MED FILL — ?DULoxetine HCL 60MG CPEP: 60 | 30 days supply | Qty: 60 | Fill #1

## 2018-04-05 MED FILL — busPIRone HCL 30 MG TABS: 30 | 30 days supply | Qty: 60 | Fill #2

## 2018-04-13 ENCOUNTER — Inpatient Hospital Stay: Payer: Medicaid Other | Attending: Oncology

## 2018-04-13 ENCOUNTER — Encounter: Payer: Self-pay | Admitting: Adult Health

## 2018-04-13 ENCOUNTER — Inpatient Hospital Stay (HOSPITAL_BASED_OUTPATIENT_CLINIC_OR_DEPARTMENT_OTHER): Payer: Medicaid Other | Admitting: Adult Health

## 2018-04-13 ENCOUNTER — Encounter: Payer: Self-pay | Admitting: *Deleted

## 2018-04-13 ENCOUNTER — Inpatient Hospital Stay: Payer: Medicaid Other

## 2018-04-13 VITALS — BP 118/89 | HR 64 | Temp 98.7°F | Resp 18 | Ht 65.0 in | Wt 211.0 lb

## 2018-04-13 DIAGNOSIS — Z5112 Encounter for antineoplastic immunotherapy: Secondary | ICD-10-CM | POA: Insufficient documentation

## 2018-04-13 DIAGNOSIS — J441 Chronic obstructive pulmonary disease with (acute) exacerbation: Secondary | ICD-10-CM

## 2018-04-13 DIAGNOSIS — D3502 Benign neoplasm of left adrenal gland: Secondary | ICD-10-CM | POA: Diagnosis not present

## 2018-04-13 DIAGNOSIS — J45901 Unspecified asthma with (acute) exacerbation: Secondary | ICD-10-CM

## 2018-04-13 DIAGNOSIS — C50411 Malignant neoplasm of upper-outer quadrant of right female breast: Secondary | ICD-10-CM

## 2018-04-13 DIAGNOSIS — Z171 Estrogen receptor negative status [ER-]: Secondary | ICD-10-CM | POA: Diagnosis not present

## 2018-04-13 DIAGNOSIS — Z803 Family history of malignant neoplasm of breast: Secondary | ICD-10-CM

## 2018-04-13 DIAGNOSIS — Z5111 Encounter for antineoplastic chemotherapy: Secondary | ICD-10-CM | POA: Insufficient documentation

## 2018-04-13 DIAGNOSIS — Z5189 Encounter for other specified aftercare: Secondary | ICD-10-CM | POA: Insufficient documentation

## 2018-04-13 DIAGNOSIS — R197 Diarrhea, unspecified: Secondary | ICD-10-CM | POA: Diagnosis not present

## 2018-04-13 DIAGNOSIS — F17210 Nicotine dependence, cigarettes, uncomplicated: Secondary | ICD-10-CM | POA: Insufficient documentation

## 2018-04-13 DIAGNOSIS — R110 Nausea: Secondary | ICD-10-CM | POA: Diagnosis not present

## 2018-04-13 LAB — CBC WITH DIFFERENTIAL/PLATELET
BASOS ABS: 0.1 10*3/uL (ref 0.0–0.1)
BASOS PCT: 1 %
Eosinophils Absolute: 0.1 10*3/uL (ref 0.0–0.5)
Eosinophils Relative: 1 %
HCT: 34 % — ABNORMAL LOW (ref 34.8–46.6)
HEMOGLOBIN: 11.3 g/dL — AB (ref 11.6–15.9)
Lymphocytes Relative: 18 %
Lymphs Abs: 1.4 10*3/uL (ref 0.9–3.3)
MCH: 29.1 pg (ref 25.1–34.0)
MCHC: 33.1 g/dL (ref 31.5–36.0)
MCV: 87.9 fL (ref 79.5–101.0)
Monocytes Absolute: 0.4 10*3/uL (ref 0.1–0.9)
Monocytes Relative: 6 %
NEUTROS PCT: 74 %
Neutro Abs: 5.6 10*3/uL (ref 1.5–6.5)
Platelets: 283 10*3/uL (ref 145–400)
RBC: 3.88 MIL/uL (ref 3.70–5.45)
RDW: 14.3 % (ref 11.2–14.5)
WBC: 7.5 10*3/uL (ref 3.9–10.3)

## 2018-04-13 LAB — COMPREHENSIVE METABOLIC PANEL
ALK PHOS: 112 U/L (ref 38–126)
ALT: 29 U/L (ref 0–44)
ANION GAP: 8 (ref 5–15)
AST: 18 U/L (ref 15–41)
Albumin: 3.6 g/dL (ref 3.5–5.0)
BUN: 14 mg/dL (ref 6–20)
CALCIUM: 9.4 mg/dL (ref 8.9–10.3)
CO2: 26 mmol/L (ref 22–32)
CREATININE: 0.9 mg/dL (ref 0.44–1.00)
Chloride: 107 mmol/L (ref 98–111)
Glucose, Bld: 90 mg/dL (ref 70–99)
Potassium: 4 mmol/L (ref 3.5–5.1)
Sodium: 141 mmol/L (ref 135–145)
TOTAL PROTEIN: 6.5 g/dL (ref 6.5–8.1)
Total Bilirubin: 0.6 mg/dL (ref 0.3–1.2)

## 2018-04-13 MED ORDER — ACETAMINOPHEN 325 MG PO TABS
ORAL_TABLET | ORAL | Status: AC
  Filled 2018-04-13: qty 2

## 2018-04-13 MED ORDER — ACETAMINOPHEN 325 MG PO TABS
650.0000 mg | ORAL_TABLET | Freq: Once | ORAL | Status: AC
  Administered 2018-04-13: 650 mg via ORAL

## 2018-04-13 MED ORDER — PALONOSETRON HCL INJECTION 0.25 MG/5ML
0.2500 mg | Freq: Once | INTRAVENOUS | Status: AC
  Administered 2018-04-13: 0.25 mg via INTRAVENOUS

## 2018-04-13 MED ORDER — DIPHENHYDRAMINE HCL 25 MG PO CAPS
ORAL_CAPSULE | ORAL | Status: AC
  Filled 2018-04-13: qty 1

## 2018-04-13 MED ORDER — CHOLESTYRAMINE 4 G PO PACK: 4.0000 g | PACK | Freq: Two times a day (BID) | ORAL | 12 refills | Status: DC

## 2018-04-13 MED ORDER — SODIUM CHLORIDE 0.9 % IV SOLN
654.5000 mg | Freq: Once | INTRAVENOUS | Status: AC
  Administered 2018-04-13: 650 mg via INTRAVENOUS
  Filled 2018-04-13: qty 65

## 2018-04-13 MED ORDER — SODIUM CHLORIDE 0.9% FLUSH
10.0000 mL | INTRAVENOUS | Status: DC | PRN
  Filled 2018-04-13: qty 10

## 2018-04-13 MED ORDER — PALONOSETRON HCL INJECTION 0.25 MG/5ML
INTRAVENOUS | Status: AC
  Filled 2018-04-13: qty 5

## 2018-04-13 MED ORDER — SODIUM CHLORIDE 0.9 % IV SOLN
Freq: Once | INTRAVENOUS | Status: AC
  Administered 2018-04-13: 13:00:00 via INTRAVENOUS
  Filled 2018-04-13: qty 250

## 2018-04-13 MED ORDER — SODIUM CHLORIDE 0.9 % IV SOLN
Freq: Once | INTRAVENOUS | Status: AC
  Administered 2018-04-13: 13:00:00 via INTRAVENOUS
  Filled 2018-04-13: qty 5

## 2018-04-13 MED ORDER — HEPARIN SOD (PORK) LOCK FLUSH 100 UNIT/ML IV SOLN
500.0000 [IU] | Freq: Once | INTRAVENOUS | Status: DC | PRN
  Filled 2018-04-13: qty 5

## 2018-04-13 MED ORDER — TRASTUZUMAB CHEMO 150 MG IV SOLR
600.0000 mg | Freq: Once | INTRAVENOUS | Status: AC
  Administered 2018-04-13: 600 mg via INTRAVENOUS
  Filled 2018-04-13: qty 28.57

## 2018-04-13 MED ORDER — DIPHENHYDRAMINE HCL 25 MG PO CAPS
25.0000 mg | ORAL_CAPSULE | Freq: Once | ORAL | Status: AC
  Administered 2018-04-13: 25 mg via ORAL

## 2018-04-13 MED ORDER — SODIUM CHLORIDE 0.9 % IV SOLN
75.0000 mg/m2 | Freq: Once | INTRAVENOUS | Status: AC
  Administered 2018-04-13: 160 mg via INTRAVENOUS
  Filled 2018-04-13: qty 16

## 2018-04-13 MED FILL — CHOLESTYRAMINE PACKET: 4 | 30 days supply | Qty: 60 | Fill #0

## 2018-04-13 NOTE — Progress Notes (Addendum)
[name]  [phone] [phone]     ID: Jacqueline Good DOB: Oct 17, 1960  MR#: [id number]  VQQ#:[id number]  Patient Care Team: Tresa Garter, MD as PCP - General (Internal Medicine) Alphonsa Overall, MD as Consulting Physician (General Surgery) [name], [name], MD as Consulting Physician (Oncology) Eppie Gibson, MD as Attending Physician (Radiation Oncology) OTHER MD: Donnal Moat, PA-C [FAX [phone]]  CHIEF COMPLAINT: Estrogen receptor negative breast cancer  CURRENT TREATMENT: Neoadjuvant chemoimmunotherapy  INTERVAL HISTORY: Tyneka returns today for follow up of her estrogen receptor negative but HER-2 positive breast cancer accompanied by her mother and brother.  The patient is receiving neoadjuvant chemoimmunotherapy with carboplatin, docetaxel, trastuzumab, and pertuzumab every 21 days with Neulasta support on day 3. Today is day 1 cycle 2.  Pertuzumab omitted this cycle due to diarrhea.  Since her last visit she has undergone breast biopsy of two additional areas, both anterior and posterior to her tumor location.  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     REVIEW OF SYSTEMS: [name] is doing moderately well today.  She has continued to have several episodes of diarrhea every day.  She cannot tell me how many, but she says that she is taking Imodium, as much as she can.  She denies any abdominal pain or cramping.  She denies any fevers or chills.  She is without any headaches, vision issues, or appetite issues.  She has continued to have some nausea and notes that she takes her anti emetics but they sometimes make her feel worse, meaning tired and queasy.  She is doing well otherwise, and a detailed ROS is non contributory.     HISTORY OF CURRENT ILLNESS: From the original intake note:  Jacqueline Good had routine screening mammography on 02/05/2018 showing a possible abnormality in the right breast. She underwent  unilateral right diagnostic mammography with tomography and right breast ultrasonography at [facility] on 02/15/2018 showing: Highly suspicious right breast mass at 10 o'clock position.  There was a second, 0.6 cm lesion at the 10:00 radiant which has not been biopsied.  Suspicious focal cortical thickening of a single right axillary lymph node.  Accordingly on 02/25/2018 she proceeded to biopsy of the right breast area in question. The pathology from this procedure showed ([id number]): Breast, right, needle core biopsy, OUQ with microscopic focus of invasive ductal carcinoma, grade 2, arising in a background of high grade ductal carcinoma in situ. 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   The patient's subsequent history is as detailed below.    PAST MEDICAL HISTORY: Past Medical History:  Diagnosis Date  . Arthritis   . Asthma   . breast ca dx'd 02/2018   right  . Depression     PAST SURGICAL HISTORY: Past Surgical History:  Procedure Laterality Date  . endometrial ablation    . IR IMAGING GUIDED PORT INSERTION  03/18/2018  . MYOMECTOMY      FAMILY HISTORY Family History  Problem Relation Age of Onset  . Lupus Sister   . Heart disease Sister   . Breast cancer Mother    She notes that her father is currently 25 years old. Patients' mother is currently 67 years old and she was diagnosed with breast cancer at age 65. The patient has 4 brothers and 1 sister. She has a maternal aunt with breast cancer and her maternal great grandmother with had ovarian cancer.  GYNECOLOGIC HISTORY:  No LMP recorded. Patient is postmenopausal. Menarche: 57 years old Age at first live birth: 57 years old [place] P1 LMP: 13-14 years ago s/p ablation Contraceptive: no HRT: no  Hysterectomy: no SO: no  SOCIAL HISTORY: She is currently unemployed.  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 She lives alone without pets.  Her daughter is [name] who lives in [place] and works in Therapist, art.  The patient has one grandchild. She goes to a [employer].      ADVANCED DIRECTIVES: Not in place.  At the 03/03/2018 visit the patient was given the appropriate documents to complete and notarized at her discretion   HEALTH MAINTENANCE: Social History   Tobacco Use  . Smoking status: Current Every Day Smoker    Packs/day: 1.00    Years: 20.00    Pack years: 20.00    Types: Cigarettes  . Smokeless tobacco: Never Used  Substance Use Topics  . Alcohol use: Yes    Comment: occasionally  . Drug use: No     Colonoscopy: Never  PAP:   Bone density: Never   Allergies  Allergen Reactions  . Gadolinium Derivatives Itching and Cough    Pt began sneezing and coughing as soon as MRI contrast was injected. Severe nasal congestion. No rash or hives no SOB.     Current Outpatient Medications  Medication Sig Dispense Refill  . albuterol (VENTOLIN HFA) 108 (90 Base) MCG/ACT inhaler INHALE 2 PUFFS INTO THE LUNGS EVERY 6 HOURS AS NEEDED FOR WHEEZING 54 g 3  . ALPRAZolam (XANAX) 1 MG tablet Take 1 mg by mouth 3 (three) times daily as needed. for anxiety  2  . busPIRone (BUSPAR) 30 MG tablet Take 30 mg by mouth 2 (two) times daily.  2  . cetirizine-pseudoephedrine (ZYRTEC-D) 5-120 MG tablet Take 1 tablet by mouth daily. 15 tablet 0  . dexamethasone (DECADRON) 4 MG tablet Take 2 tablets (8 mg total) by mouth 2 (two) times daily. Start the day before Taxotere. Take once the day after, then 2 times a day x 2d. 30 tablet 1  . DULoxetine (CYMBALTA) 60 MG capsule TAKE 2 CAPSULES BY MOUTH DAILY IN THE MORNING.  5  . DULoxetine HCl (CYMBALTA PO) Take by mouth.    . esomeprazole (NEXIUM) 40 MG capsule Take 1 capsule (40 mg total) by mouth daily. 30 capsule 5  . fluticasone (FLONASE) 50 MCG/ACT nasal spray Place 2 sprays into both nostrils daily. 1 g 0  . Fluticasone-Salmeterol (ADVAIR DISKUS) 250-50  MCG/DOSE AEPB INHALE 1 PUFF INTO THE LUNGS 2 TIMES DAILY. 60 each 2  . lidocaine-prilocaine (EMLA) cream Apply to affected area once 30 g 3  . LORazepam (ATIVAN) 0.5 MG tablet Take 1 tablet (0.5 mg total) by mouth at bedtime as needed (Nausea or vomiting). 30 tablet 0  . Melatonin 5 MG TABS Take 1 tablet by mouth at bedtime.    . naproxen (NAPROSYN) 500 MG tablet Take 1 tablet (500 mg total) by mouth 2 (two) times daily with a meal. 60 tablet 3  . prochlorperazine (COMPAZINE) 10 MG tablet Take 1 tablet (10 mg total) by mouth every 6 (six) hours as needed (Nausea or vomiting). 30 tablet 1  . venlafaxine (EFFEXOR) 75 MG tablet Take 75 mg by mouth 2 (two) times daily.    Marland Kitchen zolpidem (AMBIEN) 10 MG tablet Take 10 mg by mouth at bedtime as needed for sleep.     No current facility-administered medications for this  visit.     OBJECTIVE: Middle-aged African-American woman in no acute distress  Vitals:   04/13/18 1142  BP: 118/89  Pulse: 64  Resp: 18  Temp: 98.7 F (37.1 C)  SpO2: 98%     Body mass index is 35.11 kg/m.   Wt Readings from Last 3 Encounters:  04/13/18 211 lb (95.7 kg)  03/31/18 207 lb 12.8 oz (94.3 kg)  03/18/18 212 lb 9.6 oz (96.4 kg)      ECOG FS:1 - Symptomatic but completely ambulatory GENERAL: Patient is a well appearing female in no acute distress HEENT:  Sclerae anicteric.  Oropharynx clear and moist. No ulcerations or evidence of oropharyngeal candidiasis. Neck is supple.  NODES:  No cervical, supraclavicular, or axillary lymphadenopathy palpated.  BREAST EXAM:  Ecchymosis noted on right breast in upper outer quadrant.  No sign of infection noted.  Limited exam due to bruising, left breast without nodules, masses, skin or nipple changes. LUNGS:  Clear to auscultation bilaterally.  No wheezes or rhonchi. HEART:  Regular rate and rhythm. No murmur appreciated. ABDOMEN:  Soft, nontender.  Positive, normoactive bowel sounds. No organomegaly palpated. MSK:  No focal  spinal tenderness to palpation. Full range of motion bilaterally in the upper extremities. EXTREMITIES:  No peripheral edema.   SKIN:  Clear with no obvious rashes or skin changes. No nail dyscrasia. NEURO:  Nonfocal. Well oriented.  Appropriate affect.    LAB RESULTS:  CMP     Component Value Date/Time   NA 140 03/31/2018 1200   K 3.3 (L) 03/31/2018 1200   CL 103 03/31/2018 1200   CO2 24 03/31/2018 1200   GLUCOSE 149 (H) 03/31/2018 1200   BUN 26 (H) 03/31/2018 1200   CREATININE 1.64 (H) 03/31/2018 1200   CREATININE 0.80 03/03/2018 0830   CREATININE 0.83 09/24/2016 1151   CALCIUM 9.6 03/31/2018 1200   PROT 7.9 03/31/2018 1200   ALBUMIN 4.3 03/31/2018 1200   AST 16 03/31/2018 1200   AST 15 03/03/2018 0830   ALT 33 03/31/2018 1200   ALT 16 03/03/2018 0830   ALKPHOS 148 (H) 03/31/2018 1200   BILITOT 0.2 (L) 03/31/2018 1200   BILITOT 0.5 03/03/2018 0830   GFRNONAA 34 (L) 03/31/2018 1200   GFRNONAA >60 03/03/2018 0830   GFRNONAA 79 09/24/2016 1151   GFRAA 39 (L) 03/31/2018 1200   GFRAA >60 03/03/2018 0830   GFRAA >89 09/24/2016 1151    No results found for: TOTALPROTELP, ALBUMINELP, A1GS, A2GS, BETS, BETA2SER, GAMS, MSPIKE, SPEI  No results found for: KPAFRELGTCHN, LAMBDASER, KAPLAMBRATIO  Lab Results  Component Value Date   WBC 7.5 04/13/2018   NEUTROABS 5.6 04/13/2018   HGB 11.3 (L) 04/13/2018   HCT 34.0 (L) 04/13/2018   MCV 87.9 04/13/2018   PLT 283 04/13/2018    _0 @  No results found for: LABCA2  No components found for: YSAYTK160  No results for input(s): INR in the last 168 hours.  No results found for: LABCA2  No results found for: FUX323  No results found for: FTD322  No results found for: GUR427  No results found for: CA2729  No components found for: HGQUANT  No results found for: CEA1 / No results found for: CEA1   No results found for: AFPTUMOR  No results found for: CHROMOGRNA  No results found for: PSA1  Appointment  on 04/13/2018  Component Date Value Ref Range Status  . WBC 04/13/2018 7.5  3.9 - 10.3 K/uL Final  . RBC 04/13/2018 3.88  3.70 -  5.45 MIL/uL Final  . Hemoglobin 04/13/2018 11.3* 11.6 - 15.9 g/dL Final  . HCT 04/13/2018 34.0* 34.8 - 46.6 % Final  . MCV 04/13/2018 87.9  79.5 - 101.0 fL Final  . MCH 04/13/2018 29.1  25.1 - 34.0 pg Final  . MCHC 04/13/2018 33.1  31.5 - 36.0 g/dL Final  . RDW 04/13/2018 14.3  11.2 - 14.5 % Final  . Platelets 04/13/2018 283  145 - 400 K/uL Final  . Neutrophils Relative % 04/13/2018 74  % Final  . Neutro Abs 04/13/2018 5.6  1.5 - 6.5 K/uL Final  . Lymphocytes Relative 04/13/2018 18  % Final  . Lymphs Abs 04/13/2018 1.4  0.9 - 3.3 K/uL Final  . Monocytes Relative 04/13/2018 6  % Final  . Monocytes Absolute 04/13/2018 0.4  0.1 - 0.9 K/uL Final  . Eosinophils Relative 04/13/2018 1  % Final  . Eosinophils Absolute 04/13/2018 0.1  0.0 - 0.5 K/uL Final  . Basophils Relative 04/13/2018 1  % Final  . Basophils Absolute 04/13/2018 0.1  0.0 - 0.1 K/uL Final   Performed at [facility], [address] [place], [place] [address]    (this displays the last labs from the last 3 days)  No results found for: TOTALPROTELP, ALBUMINELP, A1GS, A2GS, BETS, BETA2SER, GAMS, MSPIKE, SPEI (this displays SPEP labs)  No results found for: KPAFRELGTCHN, LAMBDASER, KAPLAMBRATIO (kappa/lambda light chains)  No results found for: HGBA, HGBA2QUANT, HGBFQUANT, HGBSQUAN (Hemoglobinopathy evaluation)   No results found for: LDH  No results found for: IRON, TIBC, IRONPCTSAT (Iron and TIBC)  No results found for: FERRITIN  Urinalysis    Component Value Date/Time   COLORURINE CANCELED 09/24/2016 1151   APPEARANCEUR CANCELED 09/24/2016 1151   LABSPEC CANCELED 09/24/2016 1151   PHURINE CANCELED 09/24/2016 1151   GLUCOSEU CANCELED 09/24/2016 1151   HGBUR CANCELED 09/24/2016 1151   BILIRUBINUR CANCELED 09/24/2016 1151   KETONESUR CANCELED 09/24/2016 1151     PROTEINUR CANCELED 09/24/2016 1151   NITRITE CANCELED 09/24/2016 1151   LEUKOCYTESUR CANCELED 09/24/2016 1151     STUDIES:  Ct Chest W Contrast  Result Date: 03/16/2018 CLINICAL DATA:  New diagnosis of invasive right breast cancer. EXAM: CT CHEST WITH CONTRAST TECHNIQUE: Multidetector CT imaging of the chest was performed during intravenous contrast administration. CONTRAST:  60m OMNIPAQUE IOHEXOL 300 MG/ML  SOLN COMPARISON:  Radiographs 11/15/2009. FINDINGS: Cardiovascular: No significant vascular findings. There is limited opacification of the pulmonary arteries. Probable faint coronary artery calcifications. The heart size is normal. There is no pericardial effusion. Mediastinum/Nodes: There are no enlarged mediastinal, hilar, axillary or internal mammary lymph nodes. The thyroid gland and trachea appear normal. There is a small hiatal hernia. Lungs/Pleura: There is no pleural effusion. The lungs are clear. Upper abdomen: Subjective mild hepatic steatosis without focal abnormality. There is a nonspecific 2.9 x 2.4 cm left adrenal nodule measuring 32 HU. Musculoskeletal/Chest wall: Ill-defined right breast mass noted with associated central biopsy clip. No suspicious osseous findings. IMPRESSION: 1. No evidence of thoracic metastatic disease. 2. Indeterminate left adrenal nodule. Consider further characterization with noncontrast CT through the adrenal glands. 3. Subjective mild hepatic steatosis. 4. Known right breast mass, biopsy proven malignancy. Electronically Signed   By: WRichardean SaleM.D.   On: 03/16/2018 17:27   Nm Bone Scan Whole Body  Result Date: 03/16/2018 CLINICAL DATA:  New diagnosis of invasive right breast cancer. EXAM: NUCLEAR MEDICINE WHOLE BODY BONE SCAN TECHNIQUE: Whole body anterior and posterior images were obtained approximately  3 hours after intravenous injection of radiopharmaceutical. RADIOPHARMACEUTICALS:  20.0 mCi Technetium-68mMDP IV COMPARISON:  Chest CT same date.  FINDINGS: There is no osseous uptake suspicious for metastatic disease. There is mild generalized calvarial uptake consistent with hyperostosis. There is multifocal large joint activity involving the elbows, wrists, knees and feet consistent with arthropathy. The soft tissue activity appears unremarkable aside from mild urine contamination in the perineum. IMPRESSION: No evidence of osseous metastatic disease. Electronically Signed   By: WRichardean SaleM.D.   On: 03/16/2018 17:49   Mm Clip Placement Right  Result Date: 03/31/2018 CLINICAL DATA:  Biopsy proven invasive mammary carcinoma and ductal carcinoma in-situ in the 10 o'clock region of the right breast. MRI showed additional non masslike enhancement in the right breast. MR guided core biopsies performed for extent of disease. EXAM: DIAGNOSTIC RIGHT MAMMOGRAM POST MRI BIOPSY COMPARISON:  Previous exam(s). FINDINGS: Mammographic images were obtained following MR guided biopsies of the right breast. Mammographic images show there is a dumbbell-shaped clip in the posterior third of the upper-outer quadrant of the right breast. It is located 3.2 cm posterior to the ribbon shaped clip (biopsy proven invasive mammary carcinoma and ductal carcinoma in-situ). There is a cylindrical shaped clip located in the anterior third of the upper-outer quadrant of the right breast 3.8 cm anterior to the ribbon shaped clip. IMPRESSION: Status post MR guided core biopsies of the right breast with pathology pending. Final Assessment: Post Procedure Mammograms for Marker Placement Electronically Signed   By: DLillia MountainM.D.   On: 03/31/2018 10:09   Mr Rt Breast Bx W Loc Dev 1st Lesion Image Bx Spec Mr Guide  Addendum Date: 04/05/2018   ADDENDUM REPORT: 04/02/2018 10:49 ADDENDUM: Pathology revealed HIGH GRADE DUCTAL CARCINOMA IN SITU WITH NECROSIS of RIGHT breast, posterior. This was found to be concordant by Dr. DLillia Mountain Pathology revealed HIGH GRADE DUCTAL CARCINOMA IN  SITU WITH FOCI SUSPICIOUS FOR MICROINVASION of RIGHT breast, anterior. This was found to be concordant by Dr. DLillia Mountain Pathology results were discussed with the patient by telephone. The patient reported doing well after the biopsy with tenderness at the site. Post biopsy instructions and care were reviewed and questions were answered. [facility]. The patient has a recent diagnosis of RIGHT breast cancer and should follow her outlined treatment plan. [facility] of the biopsy results via Epic message. Pathology results reported by ARoselind Messier RN on 04/02/2018. Electronically Signed   By: DLillia MountainM.D.   On: 04/02/2018 10:49   Result Date: 04/05/2018 CLINICAL DATA:  Biopsy proven invasive ductal carcinoma and high-grade ductal carcinoma in-situ in the 10 o'clock region of the right breast. MRI showed additional non masslike enhancement extending anterior and posterior to the mass. MR guided core biopsies of the anterior and posterior enhancement requested. EXAM: MRI GUIDED CORE NEEDLE BIOPSY OF THE RIGHT BREAST TECHNIQUE: Multiplanar, multisequence MR imaging of the right breast was performed both before and after administration of intravenous contrast. CONTRAST:  144mMULTIHANCE GADOBENATE DIMEGLUMINE 529 MG/ML IV SOLN COMPARISON:  Previous exams. FINDINGS: I met with the patient, and we discussed the procedure of MRI guided biopsy, including risks, benefits, and alternatives. Specifically, we discussed the risks of infection, bleeding, tissue injury, clip migration, and inadequate sampling. Informed, written consent was given.  The usual time out protocol was performed immediately prior to the procedure. Using sterile technique, 1% Lidocaine, MRI guidance, and a 9 gauge vacuum assisted  device, biopsy was performed of posterior enhancement in the upper-outer quadrant using a lateral to medial approach. At the conclusion of the procedure, a dumbbell-shaped tissue marker clip was deployed into the biopsy cavity. Follow-up 2-view mammogram was performed and dictated separately. I met with the patient, and we discussed the procedure of MRI guided biopsy, including risks, benefits, and alternatives. Specifically, we discussed the risks of infection, bleeding, tissue injury, clip migration, and inadequate sampling. Informed, written consent was given. The usual time out protocol was performed immediately prior to the procedure. Using sterile technique, 1% Lidocaine, MRI guidance, and a 9 gauge vacuum assisted device, biopsy was performed of anterior enhancement in the upper-outer quadrant using a lateral to medial approach. At the conclusion of the procedure, a cylindrical shaped tissue marker clip was deployed into the biopsy cavity. Follow-up 2-view mammogram was performed and dictated separately. IMPRESSION: MRI guided biopsies of the right breast.  No apparent complications. Electronically Signed: By: Lillia Mountain M.D. On: 03/31/2018 10:04   Mr Rt Breast Bx W Loc Dev Ea Add Lesion Image Bx Spec Mr Guide  Addendum Date: 04/05/2018   ADDENDUM REPORT: 04/02/2018 10:49 ADDENDUM: Pathology revealed HIGH GRADE DUCTAL CARCINOMA IN SITU WITH NECROSIS of RIGHT breast, posterior. This was found to be concordant by Dr. Lillia Mountain. Pathology revealed HIGH GRADE DUCTAL CARCINOMA IN SITU WITH FOCI SUSPICIOUS FOR MICROINVASION of RIGHT breast, anterior. This was found to be concordant by Dr. Lillia Mountain. Pathology results were discussed with the patient by telephone. The patient reported doing well after the biopsy with tenderness at the site. Post biopsy instructions and care were reviewed and questions were answered. The patient was encouraged to call [facility] for any additional  concerns. The patient has a recent diagnosis of RIGHT breast cancer and should follow her outlined treatment plan. Dr Lurline Del of [facility] and Dr Alphonsa Overall of [facility], both in [place], [place], have been notified of the biopsy results via Epic message. Pathology results reported by Roselind Messier, RN on 04/02/2018. Electronically Signed   By: Lillia Mountain M.D.   On: 04/02/2018 10:49   Result Date: 04/05/2018 CLINICAL DATA:  Biopsy proven invasive ductal carcinoma and high-grade ductal carcinoma in-situ in the 10 o'clock region of the right breast. MRI showed additional non masslike enhancement extending anterior and posterior to the mass. MR guided core biopsies of the anterior and posterior enhancement requested. EXAM: MRI GUIDED CORE NEEDLE BIOPSY OF THE RIGHT BREAST TECHNIQUE: Multiplanar, multisequence MR imaging of the right breast was performed both before and after administration of intravenous contrast. CONTRAST:  75m MULTIHANCE GADOBENATE DIMEGLUMINE 529 MG/ML IV SOLN COMPARISON:  Previous exams. FINDINGS: I met with the patient, and we discussed the procedure of MRI guided biopsy, including risks, benefits, and alternatives. Specifically, we discussed the risks of infection, bleeding, tissue injury, clip migration, and inadequate sampling. Informed, written consent was given. The usual time out protocol was performed immediately prior to the procedure. Using sterile technique, 1% Lidocaine, MRI guidance, and a 9 gauge vacuum assisted device, biopsy was performed of posterior enhancement in the upper-outer quadrant using a lateral to medial approach. At the conclusion of the procedure, a dumbbell-shaped tissue marker clip was deployed into the biopsy cavity. Follow-up 2-view mammogram was performed and dictated separately. I met with  the patient, and we discussed the procedure of MRI guided biopsy, including risks, benefits, and alternatives. Specifically, we  discussed the risks of infection, bleeding, tissue injury, clip migration, and inadequate sampling. Informed, written consent was given. The usual time out protocol was performed immediately prior to the procedure. Using sterile technique, 1% Lidocaine, MRI guidance, and a 9 gauge vacuum assisted device, biopsy was performed of anterior enhancement in the upper-outer quadrant using a lateral to medial approach. At the conclusion of the procedure, a cylindrical shaped tissue marker clip was deployed into the biopsy cavity. Follow-up 2-view mammogram was performed and dictated separately. IMPRESSION: MRI guided biopsies of the right breast.  No apparent complications. Electronically Signed: By: Lillia Mountain M.D. On: 03/31/2018 10:04   Ir Imaging Guided Port Insertion  Result Date: 03/18/2018 CLINICAL DATA:  Right breast carcinoma metastatic to right axillary lymph node and need for porta cath to begin chemotherapy. EXAM: IMPLANTED PORT A CATH PLACEMENT WITH ULTRASOUND AND FLUOROSCOPIC GUIDANCE ANESTHESIA/SEDATION: 4.0 mg IV Versed; 150 mcg IV Fentanyl Total Moderate Sedation Time:  40 minutes The patient's level of consciousness and physiologic status were continuously monitored during the procedure by Radiology nursing. Additional Medications: 2 g IV Ancef. FLUOROSCOPY TIME:  36 seconds.  6.7 mGy. PROCEDURE: The procedure, risks, benefits, and alternatives were explained to the patient. Questions regarding the procedure were encouraged and answered. The patient understands and consents to the procedure. A time-out was performed prior to initiating the procedure. Ultrasound was utilized to confirm patency of the left internal jugular vein. The left neck and chest were prepped with chlorhexidine in a sterile fashion, and a sterile drape was applied covering the operative field. Maximum barrier sterile technique with sterile gowns and gloves were used for the procedure. Local anesthesia was provided with 1% lidocaine.  After creating a small venotomy incision, a 21 gauge needle was advanced into the left internal jugular vein under direct, real-time ultrasound guidance. Ultrasound image documentation was performed. After securing guidewire access, an 8 Fr dilator was placed. A J-wire was kinked to measure appropriate catheter length. A subcutaneous port pocket was then created along the upper chest wall utilizing sharp and blunt dissection. Portable cautery was utilized. The pocket was irrigated with sterile saline. A single lumen power injectable port was chosen for placement. The 8 Fr catheter was tunneled from the port pocket site to the venotomy incision. The port was placed in the pocket. External catheter was trimmed to appropriate length based on guidewire measurement. At the venotomy, an 8 Fr peel-away sheath was placed over a guidewire. The catheter was then placed through the sheath and the sheath removed. Final catheter positioning was confirmed and documented with a fluoroscopic spot image. The port was accessed with a needle and aspirated and flushed with heparinized saline. The access needle was removed. The venotomy and port pocket incisions were closed with subcutaneous 3-0 Monocryl and subcuticular 4-0 Vicryl. Dermabond was applied to both incisions. COMPLICATIONS: COMPLICATIONS None FINDINGS: After catheter placement, the tip lies at the cavo-atrial junction. The catheter aspirates normally and is ready for immediate use. IMPRESSION: Placement of single lumen port a cath via left internal jugular vein. The catheter tip lies at the cavo-atrial junction. A power injectable port a cath was placed and is ready for immediate use. Electronically Signed   By: Aletta Edouard M.D.   On: 03/18/2018 17:08    ELIGIBLE FOR AVAILABLE RESEARCH PROTOCOL:BCEP  ASSESSMENT: 57 y.o. [place] woman status post right breast upper outer quadrant  biopsy 02/25/2018 for a clinical T2 pN1, stage IIB invasive ductal carcinoma,  estrogen and progesterone receptor negative, HER-2 amplified, with an MIB-1 of 50%.  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  (4) definitive surgery with targeted axillary dissection to follow  (5) adjuvant radiation  (6) left adrenal adenoma measuring 2.9 cm on CT of the chest 03/16/2018  PLAN:  Harmonie is doing moderately well.  I reviewed her case and new pathology results with Dr. Jana Hakim in detail.  She will proceed with chemotherapy today.  I sent in some Questran for her diarrhea and told her how to take it.  Dr. Jana Hakim met with Cletus Gash and reviewed the fact that she will likely need mastectomy.  She was tearful.  He recommended that she talk to a plastic surgeon to evaluate her options for reconstruction.  He recommended Dr. Iran Planas.  We will call her office and see if she takes medicaid, and if so, we will place the referral.    Autry will return in one week for labs and f/u. She knows to call for any concerns that may arise before then, and I highlighted for her to call if she continues to have diarrhea.     A total of (30) minutes of face-to-face time was spent with this patient with greater than 50% of that time in counseling and care-coordination.    Wilber Bihari, NP  04/13/18 11:50 AM Medical Oncology and Hematology [facility] [address] [place], [place] [address] Tel. [phone]    Fax. [phone]   ADDENDUM: Chanie is doing moderately well with her treatment and we are proceeding as planned.  I reviewed the pathology from the recent  2 biopsies with her.  While I do not make the final decision regarding surgery--that will be up to Dr. Judene Companion do think very likely she will need mastectomy and I discussed that with her.  If that is the case she will be interested in reconstruction and I will see if we can get her to see Dr. Iran Planas to start the discussion.  We are holding the Pertuzumab because of problems with diarrhea.  We might consider resuming it at cycle 4 depending on how she is doing at that time.  I personally saw this patient and performed a substantive portion of this encounter with the listed APP documented above.   Chauncey Cruel, MD Medical Oncology and Hematology [facility] [address] [place], [place] [address] Tel. [phone]    Fax. [phone]

## 2018-04-13 NOTE — Patient Instructions (Signed)
[facility] Discharge Instructions for Patients Receiving Chemotherapy  Today you received the following chemotherapy agents Herceptin, perjeta, taxotere, and carboplatin  To help prevent nausea and vomiting after your treatment, we encourage you to take your nausea medication as directed   If you develop nausea and vomiting that is not controlled by your nausea medication, call the clinic.   BELOW ARE SYMPTOMS THAT SHOULD BE REPORTED IMMEDIATELY:  *FEVER GREATER THAN 100.5 F  *CHILLS WITH OR WITHOUT FEVER  NAUSEA AND VOMITING THAT IS NOT CONTROLLED WITH YOUR NAUSEA MEDICATION  *UNUSUAL SHORTNESS OF BREATH  *UNUSUAL BRUISING OR BLEEDING  TENDERNESS IN MOUTH AND THROAT WITH OR WITHOUT PRESENCE OF ULCERS  *URINARY PROBLEMS  *BOWEL PROBLEMS  UNUSUAL RASH Items with * indicate a potential emergency and should be followed up as soon as possible.  Feel free to call the clinic should you have any questions or concerns. The clinic phone number is [phone].  Please show the [facility] at check-in to the Emergency Department and triage nurse.   Trastuzumab (Herceptin) injection for infusion What is this medicine? TRASTUZUMAB (tras TOO zoo mab) is a monoclonal antibody. It is used to treat breast cancer and stomach cancer. This medicine may be used for other purposes; ask your health care provider or pharmacist if you have questions. COMMON BRAND NAME(S): Herceptin What should I tell my health care provider before I take this medicine? They need to know if you have any of these conditions: -heart disease -heart failure -lung or breathing disease, like asthma -an unusual or allergic reaction to trastuzumab, benzyl alcohol, or other medications, foods, dyes, or preservatives -pregnant or trying to get pregnant -breast-feeding How should I use this medicine? This drug is given as an infusion into a vein. It is administered in a hospital or clinic by a  specially trained health care professional. Talk to your pediatrician regarding the use of this medicine in children. This medicine is not approved for use in children. Overdosage: If you think you have taken too much of this medicine contact a poison control center or emergency room at once. NOTE: This medicine is only for you. Do not share this medicine with others. What if I miss a dose? It is important not to miss a dose. Call your doctor or health care professional if you are unable to keep an appointment. What may interact with this medicine? This medicine may interact with the following medications: -certain types of chemotherapy, such as daunorubicin, doxorubicin, epirubicin, and idarubicin This list may not describe all possible interactions. Give your health care provider a list of all the medicines, herbs, non-prescription drugs, or dietary supplements you use. Also tell them if you smoke, drink alcohol, or use illegal drugs. Some items may interact with your medicine. What should I watch for while using this medicine? Visit your doctor for checks on your progress. Report any side effects. Continue your course of treatment even though you feel ill unless your doctor tells you to stop. Call your doctor or health care professional for advice if you get a fever, chills or sore throat, or other symptoms of a cold or flu. Do not treat yourself. Try to avoid being around people who are sick. You may experience fever, chills and shaking during your first infusion. These effects are usually mild and can be treated with other medicines. Report any side effects during the infusion to your health care professional. Fever and chills usually do not happen with later  infusions. Do not become pregnant while taking this medicine or for 7 months after stopping it. Women should inform their doctor if they wish to become pregnant or think they might be pregnant. Women of child-bearing potential will need to  have a negative pregnancy test before starting this medicine. There is a potential for serious side effects to an unborn child. Talk to your health care professional or pharmacist for more information. Do not breast-feed an infant while taking this medicine or for 7 months after stopping it. Women must use effective birth control with this medicine. What side effects may I notice from receiving this medicine? Side effects that you should report to your doctor or health care professional as soon as possible: -allergic reactions like skin rash, itching or hives, swelling of the face, lips, or tongue -chest pain or palpitations -cough -dizziness -feeling faint or lightheaded, falls -fever -general ill feeling or flu-like symptoms -signs of worsening heart failure like breathing problems; swelling in your legs and feet -unusually weak or tired Side effects that usually do not require medical attention (report to your doctor or health care professional if they continue or are bothersome): -bone pain -changes in taste -diarrhea -joint pain -nausea/vomiting -weight loss This list may not describe all possible side effects. Call your doctor for medical advice about side effects. You may report side effects to FDA at 1-800-FDA-1088. Where should I keep my medicine? This drug is given in a hospital or clinic and will not be stored at home. NOTE: This sheet is a summary. It may not cover all possible information. If you have questions about this medicine, talk to your doctor, pharmacist, or health care provider.  2018 Elsevier/Gold Standard (2016-07-22 14:37:52)   Pertuzumab (Perjeta) injection What is this medicine? PERTUZUMAB (per TOOZ ue mab) is a monoclonal antibody. It is used to treat breast cancer. This medicine may be used for other purposes; ask your health care provider or pharmacist if you have questions. COMMON BRAND NAME(S): PERJETA What should I tell my health care provider before  I take this medicine? They need to know if you have any of these conditions: -heart disease -heart failure -high blood pressure -history of irregular heart beat -recent or ongoing radiation therapy -an unusual or allergic reaction to pertuzumab, other medicines, foods, dyes, or preservatives -pregnant or trying to get pregnant -breast-feeding How should I use this medicine? This medicine is for infusion into a vein. It is given by a health care professional in a hospital or clinic setting. Talk to your pediatrician regarding the use of this medicine in children. Special care may be needed. Overdosage: If you think you have taken too much of this medicine contact a poison control center or emergency room at once. NOTE: This medicine is only for you. Do not share this medicine with others. What if I miss a dose? It is important not to miss your dose. Call your doctor or health care professional if you are unable to keep an appointment. What may interact with this medicine? Interactions are not expected. Give your health care provider a list of all the medicines, herbs, non-prescription drugs, or dietary supplements you use. Also tell them if you smoke, drink alcohol, or use illegal drugs. Some items may interact with your medicine. This list may not describe all possible interactions. Give your health care provider a list of all the medicines, herbs, non-prescription drugs, or dietary supplements you use. Also tell them if you smoke, drink alcohol, or use illegal  drugs. Some items may interact with your medicine. What should I watch for while using this medicine? Your condition will be monitored carefully while you are receiving this medicine. Report any side effects. Continue your course of treatment even though you feel ill unless your doctor tells you to stop. Do not become pregnant while taking this medicine or for 7 months after stopping it. Women should inform their doctor if they wish to  become pregnant or think they might be pregnant. Women of child-bearing potential will need to have a negative pregnancy test before starting this medicine. There is a potential for serious side effects to an unborn child. Talk to your health care professional or pharmacist for more information. Do not breast-feed an infant while taking this medicine or for 7 months after stopping it. Women must use effective birth control with this medicine. Call your doctor or health care professional for advice if you get a fever, chills or sore throat, or other symptoms of a cold or flu. Do not treat yourself. Try to avoid being around people who are sick. You may experience fever, chills, and headache during the infusion. Report any side effects during the infusion to your health care professional. What side effects may I notice from receiving this medicine? Side effects that you should report to your doctor or health care professional as soon as possible: -breathing problems -chest pain or palpitations -dizziness -feeling faint or lightheaded -fever or chills -skin rash, itching or hives -sore throat -swelling of the face, lips, or tongue -swelling of the legs or ankles -unusually weak or tired Side effects that usually do not require medical attention (report to your doctor or health care professional if they continue or are bothersome): -diarrhea -hair loss -nausea, vomiting -tiredness This list may not describe all possible side effects. Call your doctor for medical advice about side effects. You may report side effects to FDA at 1-800-FDA-1088. Where should I keep my medicine? This drug is given in a hospital or clinic and will not be stored at home. NOTE: This sheet is a summary. It may not cover all possible information. If you have questions about this medicine, talk to your doctor, pharmacist, or health care provider.  2018 Elsevier/Gold Standard (2015-08-30 12:08:50)   Docetaxel (Taxotere)  injection What is this medicine? DOCETAXEL (doe se TAX el) is a chemotherapy drug. It targets fast dividing cells, like cancer cells, and causes these cells to die. This medicine is used to treat many types of cancers like breast cancer, certain stomach cancers, head and neck cancer, lung cancer, and prostate cancer. This medicine may be used for other purposes; ask your health care provider or pharmacist if you have questions. COMMON BRAND NAME(S): Docefrez, Taxotere What should I tell my health care provider before I take this medicine? They need to know if you have any of these conditions: -infection (especially a virus infection such as chickenpox, cold sores, or herpes) -liver disease -low blood counts, like low white cell, platelet, or red cell counts -an unusual or allergic reaction to docetaxel, polysorbate 80, other chemotherapy agents, other medicines, foods, dyes, or preservatives -pregnant or trying to get pregnant -breast-feeding How should I use this medicine? This drug is given as an infusion into a vein. It is administered in a hospital or clinic by a specially trained health care professional. Talk to your pediatrician regarding the use of this medicine in children. Special care may be needed. Overdosage: If you think you have taken too much  of this medicine contact a poison control center or emergency room at once. NOTE: This medicine is only for you. Do not share this medicine with others. What if I miss a dose? It is important not to miss your dose. Call your doctor or health care professional if you are unable to keep an appointment. What may interact with this medicine? -cyclosporine -erythromycin -ketoconazole -medicines to increase blood counts like filgrastim, pegfilgrastim, sargramostim -vaccines Talk to your doctor or health care professional before taking any of these medicines: -acetaminophen -aspirin -ibuprofen -ketoprofen -naproxen This list may not  describe all possible interactions. Give your health care provider a list of all the medicines, herbs, non-prescription drugs, or dietary supplements you use. Also tell them if you smoke, drink alcohol, or use illegal drugs. Some items may interact with your medicine. What should I watch for while using this medicine? Your condition will be monitored carefully while you are receiving this medicine. You will need important blood work done while you are taking this medicine. This drug may make you feel generally unwell. This is not uncommon, as chemotherapy can affect healthy cells as well as cancer cells. Report any side effects. Continue your course of treatment even though you feel ill unless your doctor tells you to stop. In some cases, you may be given additional medicines to help with side effects. Follow all directions for their use. Call your doctor or health care professional for advice if you get a fever, chills or sore throat, or other symptoms of a cold or flu. Do not treat yourself. This drug decreases your body's ability to fight infections. Try to avoid being around people who are sick. This medicine may increase your risk to bruise or bleed. Call your doctor or health care professional if you notice any unusual bleeding. This medicine may contain alcohol in the product. You may get drowsy or dizzy. Do not drive, use machinery, or do anything that needs mental alertness until you know how this medicine affects you. Do not stand or sit up quickly, especially if you are an older patient. This reduces the risk of dizzy or fainting spells. Avoid alcoholic drinks. Do not become pregnant while taking this medicine. Women should inform their doctor if they wish to become pregnant or think they might be pregnant. There is a potential for serious side effects to an unborn child. Talk to your health care professional or pharmacist for more information. Do not breast-feed an infant while taking this  medicine. What side effects may I notice from receiving this medicine? Side effects that you should report to your doctor or health care professional as soon as possible: -allergic reactions like skin rash, itching or hives, swelling of the face, lips, or tongue -low blood counts - This drug may decrease the number of white blood cells, red blood cells and platelets. You may be at increased risk for infections and bleeding. -signs of infection - fever or chills, cough, sore throat, pain or difficulty passing urine -signs of decreased platelets or bleeding - bruising, pinpoint red spots on the skin, black, tarry stools, nosebleeds -signs of decreased red blood cells - unusually weak or tired, fainting spells, lightheadedness -breathing problems -fast or irregular heartbeat -low blood pressure -mouth sores -nausea and vomiting -pain, swelling, redness or irritation at the injection site -pain, tingling, numbness in the hands or feet -swelling of the ankle, feet, hands -weight gain Side effects that usually do not require medical attention (report to your doctor or health  care professional if they continue or are bothersome): -bone pain -complete hair loss including hair on your head, underarms, pubic hair, eyebrows, and eyelashes -diarrhea -excessive tearing -changes in the color of fingernails -loosening of the fingernails -nausea -muscle pain -red flush to skin -sweating -weak or tired This list may not describe all possible side effects. Call your doctor for medical advice about side effects. You may report side effects to FDA at 1-800-FDA-1088. Where should I keep my medicine? This drug is given in a hospital or clinic and will not be stored at home. NOTE: This sheet is a summary. It may not cover all possible information. If you have questions about this medicine, talk to your doctor, pharmacist, or health care provider.  2018 Elsevier/Gold Standard (2015-08-30  12:32:56)   Carboplatin injection What is this medicine? CARBOPLATIN (KAR boe pla tin) is a chemotherapy drug. It targets fast dividing cells, like cancer cells, and causes these cells to die. This medicine is used to treat ovarian cancer and many other cancers. This medicine may be used for other purposes; ask your health care provider or pharmacist if you have questions. COMMON BRAND NAME(S): Paraplatin What should I tell my health care provider before I take this medicine? They need to know if you have any of these conditions: -blood disorders -hearing problems -kidney disease -recent or ongoing radiation therapy -an unusual or allergic reaction to carboplatin, cisplatin, other chemotherapy, other medicines, foods, dyes, or preservatives -pregnant or trying to get pregnant -breast-feeding How should I use this medicine? This drug is usually given as an infusion into a vein. It is administered in a hospital or clinic by a specially trained health care professional. Talk to your pediatrician regarding the use of this medicine in children. Special care may be needed. Overdosage: If you think you have taken too much of this medicine contact a poison control center or emergency room at once. NOTE: This medicine is only for you. Do not share this medicine with others. What if I miss a dose? It is important not to miss a dose. Call your doctor or health care professional if you are unable to keep an appointment. What may interact with this medicine? -medicines for seizures -medicines to increase blood counts like filgrastim, pegfilgrastim, sargramostim -some antibiotics like amikacin, gentamicin, neomycin, streptomycin, tobramycin -vaccines Talk to your doctor or health care professional before taking any of these medicines: -acetaminophen -aspirin -ibuprofen -ketoprofen -naproxen This list may not describe all possible interactions. Give your health care provider a list of all the  medicines, herbs, non-prescription drugs, or dietary supplements you use. Also tell them if you smoke, drink alcohol, or use illegal drugs. Some items may interact with your medicine. What should I watch for while using this medicine? Your condition will be monitored carefully while you are receiving this medicine. You will need important blood work done while you are taking this medicine. This drug may make you feel generally unwell. This is not uncommon, as chemotherapy can affect healthy cells as well as cancer cells. Report any side effects. Continue your course of treatment even though you feel ill unless your doctor tells you to stop. In some cases, you may be given additional medicines to help with side effects. Follow all directions for their use. Call your doctor or health care professional for advice if you get a fever, chills or sore throat, or other symptoms of a cold or flu. Do not treat yourself. This drug decreases your body's ability to fight  infections. Try to avoid being around people who are sick. This medicine may increase your risk to bruise or bleed. Call your doctor or health care professional if you notice any unusual bleeding. Be careful brushing and flossing your teeth or using a toothpick because you may get an infection or bleed more easily. If you have any dental work done, tell your dentist you are receiving this medicine. Avoid taking products that contain aspirin, acetaminophen, ibuprofen, naproxen, or ketoprofen unless instructed by your doctor. These medicines may hide a fever. Do not become pregnant while taking this medicine. Women should inform their doctor if they wish to become pregnant or think they might be pregnant. There is a potential for serious side effects to an unborn child. Talk to your health care professional or pharmacist for more information. Do not breast-feed an infant while taking this medicine. What side effects may I notice from receiving this  medicine? Side effects that you should report to your doctor or health care professional as soon as possible: -allergic reactions like skin rash, itching or hives, swelling of the face, lips, or tongue -signs of infection - fever or chills, cough, sore throat, pain or difficulty passing urine -signs of decreased platelets or bleeding - bruising, pinpoint red spots on the skin, black, tarry stools, nosebleeds -signs of decreased red blood cells - unusually weak or tired, fainting spells, lightheadedness -breathing problems -changes in hearing -changes in vision -chest pain -high blood pressure -low blood counts - This drug may decrease the number of white blood cells, red blood cells and platelets. You may be at increased risk for infections and bleeding. -nausea and vomiting -pain, swelling, redness or irritation at the injection site -pain, tingling, numbness in the hands or feet -problems with balance, talking, walking -trouble passing urine or change in the amount of urine Side effects that usually do not require medical attention (report to your doctor or health care professional if they continue or are bothersome): -hair loss -loss of appetite -metallic taste in the mouth or changes in taste This list may not describe all possible side effects. Call your doctor for medical advice about side effects. You may report side effects to FDA at 1-800-FDA-1088. Where should I keep my medicine? This drug is given in a hospital or clinic and will not be stored at home. NOTE: This sheet is a summary. It may not cover all possible information. If you have questions about this medicine, talk to your doctor, pharmacist, or health care provider.  2018 Elsevier/Gold Standard (2007-11-02 14:38:05)

## 2018-04-14 ENCOUNTER — Other Ambulatory Visit: Payer: Self-pay | Admitting: Adult Health

## 2018-04-14 ENCOUNTER — Other Ambulatory Visit: Payer: Self-pay | Admitting: *Deleted

## 2018-04-14 DIAGNOSIS — Z171 Estrogen receptor negative status [ER-]: Secondary | ICD-10-CM

## 2018-04-14 DIAGNOSIS — C50411 Malignant neoplasm of upper-outer quadrant of right female breast: Secondary | ICD-10-CM

## 2018-04-14 MED ORDER — LORAZEPAM 0.5 MG PO TABS: 0.5000 mg | ORAL_TABLET | Freq: Every evening | ORAL | 0 refills | Status: DC | PRN

## 2018-04-14 MED FILL — LORazepam 0.5 MG TABS: 0.5 | 30 days supply | Qty: 30 | Fill #0

## 2018-04-15 ENCOUNTER — Inpatient Hospital Stay: Payer: Medicaid Other

## 2018-04-15 DIAGNOSIS — Z5111 Encounter for antineoplastic chemotherapy: Secondary | ICD-10-CM | POA: Diagnosis not present

## 2018-04-15 DIAGNOSIS — C50411 Malignant neoplasm of upper-outer quadrant of right female breast: Secondary | ICD-10-CM

## 2018-04-15 DIAGNOSIS — Z171 Estrogen receptor negative status [ER-]: Secondary | ICD-10-CM

## 2018-04-15 MED ORDER — PEGFILGRASTIM INJECTION 6 MG/0.6ML ~~LOC~~
6.0000 mg | PREFILLED_SYRINGE | Freq: Once | SUBCUTANEOUS | Status: AC
  Administered 2018-04-15: 6 mg via SUBCUTANEOUS

## 2018-04-15 MED ORDER — PEGFILGRASTIM INJECTION 6 MG/0.6ML ~~LOC~~
PREFILLED_SYRINGE | SUBCUTANEOUS | Status: AC
  Filled 2018-04-15: qty 0.6

## 2018-04-15 NOTE — Patient Instructions (Signed)
Pegfilgrastim injection What is this medicine? PEGFILGRASTIM (PEG fil gra stim) is a long-acting granulocyte colony-stimulating factor that stimulates the growth of neutrophils, a type of white blood cell important in the body's fight against infection. It is used to reduce the incidence of fever and infection in patients with certain types of cancer who are receiving chemotherapy that affects the bone marrow, and to increase survival after being exposed to high doses of radiation. This medicine may be used for other purposes; ask your health care provider or pharmacist if you have questions. COMMON BRAND NAME(S): Neulasta What should I tell my health care provider before I take this medicine? They need to know if you have any of these conditions: -kidney disease -latex allergy -ongoing radiation therapy -sickle cell disease -skin reactions to acrylic adhesives (On-Body Injector only) -an unusual or allergic reaction to pegfilgrastim, filgrastim, other medicines, foods, dyes, or preservatives -pregnant or trying to get pregnant -breast-feeding How should I use this medicine? This medicine is for injection under the skin. If you get this medicine at home, you will be taught how to prepare and give the pre-filled syringe or how to use the On-body Injector. Refer to the patient Instructions for Use for detailed instructions. Use exactly as directed. Tell your healthcare provider immediately if you suspect that the On-body Injector may not have performed as intended or if you suspect the use of the On-body Injector resulted in a missed or partial dose. It is important that you put your used needles and syringes in a special sharps container. Do not put them in a trash can. If you do not have a sharps container, call your pharmacist or healthcare provider to get one. Talk to your pediatrician regarding the use of this medicine in children. While this drug may be prescribed for selected conditions,  precautions do apply. Overdosage: If you think you have taken too much of this medicine contact a poison control center or emergency room at once. NOTE: This medicine is only for you. Do not share this medicine with others. What if I miss a dose? It is important not to miss your dose. Call your doctor or health care professional if you miss your dose. If you miss a dose due to an On-body Injector failure or leakage, a new dose should be administered as soon as possible using a single prefilled syringe for manual use. What may interact with this medicine? Interactions have not been studied. Give your health care provider a list of all the medicines, herbs, non-prescription drugs, or dietary supplements you use. Also tell them if you smoke, drink alcohol, or use illegal drugs. Some items may interact with your medicine. This list may not describe all possible interactions. Give your health care provider a list of all the medicines, herbs, non-prescription drugs, or dietary supplements you use. Also tell them if you smoke, drink alcohol, or use illegal drugs. Some items may interact with your medicine. What should I watch for while using this medicine? You may need blood work done while you are taking this medicine. If you are going to need a MRI, CT scan, or other procedure, tell your doctor that you are using this medicine (On-Body Injector only). What side effects may I notice from receiving this medicine? Side effects that you should report to your doctor or health care professional as soon as possible: -allergic reactions like skin rash, itching or hives, swelling of the face, lips, or tongue -dizziness -fever -pain, redness, or irritation at site   where injected -pinpoint red spots on the skin -red or dark-brown urine -shortness of breath or breathing problems -stomach or side pain, or pain at the shoulder -swelling -tiredness -trouble passing urine or change in the amount of urine Side  effects that usually do not require medical attention (report to your doctor or health care professional if they continue or are bothersome): -bone pain -muscle pain This list may not describe all possible side effects. Call your doctor for medical advice about side effects. You may report side effects to FDA at 1-800-FDA-1088. Where should I keep my medicine? Keep out of the reach of children. Store pre-filled syringes in a refrigerator between 2 and 8 degrees C (36 and 46 degrees F). Do not freeze. Keep in carton to protect from light. Throw away this medicine if it is left out of the refrigerator for more than 48 hours. Throw away any unused medicine after the expiration date. NOTE: This sheet is a summary. It may not cover all possible information. If you have questions about this medicine, talk to your doctor, pharmacist, or health care provider.  2018 [employer] (2016-07-24 12:58:03)  

## 2018-04-20 ENCOUNTER — Telehealth: Payer: Self-pay | Admitting: Oncology

## 2018-04-20 ENCOUNTER — Inpatient Hospital Stay: Payer: Medicaid Other

## 2018-04-20 ENCOUNTER — Encounter: Payer: Self-pay | Admitting: Adult Health

## 2018-04-20 ENCOUNTER — Inpatient Hospital Stay (HOSPITAL_BASED_OUTPATIENT_CLINIC_OR_DEPARTMENT_OTHER): Payer: Medicaid Other | Admitting: Adult Health

## 2018-04-20 VITALS — BP 105/73 | HR 87 | Temp 98.5°F | Resp 18 | Ht 65.0 in | Wt 200.3 lb

## 2018-04-20 DIAGNOSIS — R110 Nausea: Secondary | ICD-10-CM | POA: Diagnosis not present

## 2018-04-20 DIAGNOSIS — Z5111 Encounter for antineoplastic chemotherapy: Secondary | ICD-10-CM | POA: Diagnosis not present

## 2018-04-20 DIAGNOSIS — D3502 Benign neoplasm of left adrenal gland: Secondary | ICD-10-CM

## 2018-04-20 DIAGNOSIS — R197 Diarrhea, unspecified: Secondary | ICD-10-CM

## 2018-04-20 DIAGNOSIS — C50411 Malignant neoplasm of upper-outer quadrant of right female breast: Secondary | ICD-10-CM

## 2018-04-20 DIAGNOSIS — Z171 Estrogen receptor negative status [ER-]: Secondary | ICD-10-CM

## 2018-04-20 DIAGNOSIS — Z803 Family history of malignant neoplasm of breast: Secondary | ICD-10-CM

## 2018-04-20 DIAGNOSIS — J441 Chronic obstructive pulmonary disease with (acute) exacerbation: Secondary | ICD-10-CM

## 2018-04-20 DIAGNOSIS — F17210 Nicotine dependence, cigarettes, uncomplicated: Secondary | ICD-10-CM

## 2018-04-20 DIAGNOSIS — J45901 Unspecified asthma with (acute) exacerbation: Secondary | ICD-10-CM

## 2018-04-20 LAB — COMPREHENSIVE METABOLIC PANEL
ALBUMIN: 4.6 g/dL (ref 3.5–5.0)
ALT: 72 U/L — AB (ref 0–44)
AST: 31 U/L (ref 15–41)
Alkaline Phosphatase: 143 U/L — ABNORMAL HIGH (ref 38–126)
Anion gap: 14 (ref 5–15)
BILIRUBIN TOTAL: 0.6 mg/dL (ref 0.3–1.2)
BUN: 23 mg/dL — AB (ref 6–20)
CALCIUM: 10 mg/dL (ref 8.9–10.3)
CO2: 21 mmol/L — ABNORMAL LOW (ref 22–32)
Chloride: 104 mmol/L (ref 98–111)
Creatinine, Ser: 1.34 mg/dL — ABNORMAL HIGH (ref 0.44–1.00)
GFR calc Af Amer: 50 mL/min — ABNORMAL LOW (ref >60)
GFR calc non Af Amer: 43 mL/min — ABNORMAL LOW (ref >60)
GLUCOSE: 83 mg/dL (ref 70–99)
Potassium: 3.5 mmol/L (ref 3.5–5.1)
Sodium: 139 mmol/L (ref 135–145)
Total Protein: 8.1 g/dL (ref 6.5–8.1)

## 2018-04-20 LAB — CBC WITH DIFFERENTIAL/PLATELET
Basophils Absolute: 0.1 10*3/uL (ref 0.0–0.1)
Basophils Relative: 1 %
Eosinophils Absolute: 0.1 10*3/uL (ref 0.0–0.5)
Eosinophils Relative: 1 %
HEMATOCRIT: 37.6 % (ref 34.8–46.6)
HEMOGLOBIN: 12.4 g/dL (ref 11.6–15.9)
LYMPHS PCT: 16 %
Lymphs Abs: 1.3 10*3/uL (ref 0.9–3.3)
MCH: 28.8 pg (ref 25.1–34.0)
MCHC: 33 g/dL (ref 31.5–36.0)
MCV: 87.2 fL (ref 79.5–101.0)
MONOS PCT: 15 %
Monocytes Absolute: 1.2 10*3/uL — ABNORMAL HIGH (ref 0.1–0.9)
NEUTROS ABS: 5.5 10*3/uL (ref 1.5–6.5)
NEUTROS PCT: 67 %
Platelets: 154 10*3/uL (ref 145–400)
RBC: 4.31 MIL/uL (ref 3.70–5.45)
RDW: 14 % (ref 11.2–14.5)
WBC: 8.1 10*3/uL (ref 3.9–10.3)

## 2018-04-20 MED ORDER — ONDANSETRON HCL 8 MG PO TABS: 8.0000 mg | ORAL_TABLET | Freq: Three times a day (TID) | ORAL | 0 refills | Status: DC | PRN

## 2018-04-20 MED ORDER — DIPHENOXYLATE-ATROPINE 2.5-0.025 MG PO TABS: 1.0000 | ORAL_TABLET | Freq: Four times a day (QID) | ORAL | 0 refills | Status: DC | PRN

## 2018-04-20 MED FILL — ONDANSETRON HCL 8 MG TABLET: 8 | 6 days supply | Qty: 20 | Fill #0

## 2018-04-20 MED FILL — DIPHENOXYLATE-ATROPINE 2.5-: 2.5-0.025 | 8 days supply | Qty: 30 | Fill #0

## 2018-04-20 NOTE — Progress Notes (Signed)
[place]  [phone] [phone]) [phone]     ID: Jacqueline Good DOB: [birth year]  MR#: [id number]  WTU#:[id number]  Patient Care Team: Tresa Garter, MD as PCP - General (Internal Medicine) Alphonsa Overall, MD as Consulting Physician (General Surgery) [name], [name], MD as Consulting Physician (Oncology) Eppie Gibson, MD as Attending Physician (Radiation Oncology) OTHER MD: Donnal Moat, PA-C [FAX [phone]]  CHIEF COMPLAINT: Estrogen receptor negative breast cancer  CURRENT TREATMENT: Neoadjuvant chemoimmunotherapy  INTERVAL HISTORY: Jacqueline Good returns today for follow up of her estrogen receptor negative but HER-2 positive breast cancer accompanied by her mother and brother.  The patient is receiving neoadjuvant chemoimmunotherapy with carboplatin, docetaxel, trastuzumab, and pertuzumab every 21 days with Neulasta support on day 3. Today is day 8 cycle 2.  Pertuzumab omitted this cycle due to diarrhea.   REVIEW OF SYSTEMS: Jacqueline Good has been feeling rough since this past Thursday when she received the Neulasta injection.  She notes that she has been experiencing diarrhea and nasuea since this point.  The diarrhea has been occurring about every 2 hours. She has taken Questran which helped initially, however since Sunday she noted that taking it BID wasn't really helping.  She has not taken any more imodium.  She says so far today it is better, however she has not eaten.  She does note when she drank water today she then had to have a diarrheal episode.    Jacqueline Good's nausea is all the time.  This comes in waves.  She notes that the prochlorperazine hasn't helped with her nausea.  She notes it is worse at night, and lorazepam doesn't help. Her nausea started on Thursday as well.  Jacqueline Good is not dizzy or light headed.  She does get headaches and thinks it may be related to always wearing something on her head.  Jacqueline Good is having a hard time sleeping.   Otherwise, Jacqueline Good is doing well and a detailed ROS was non contributory.   HISTORY OF CURRENT ILLNESS: From the original intake note:  Jacqueline Good had routine screening mammography on 02/05/2018 showing a possible abnormality in the right breast. She underwent unilateral right diagnostic mammography with tomography and right breast ultrasonography at [facility] on 02/15/2018 showing: Highly suspicious right breast mass at 10 o'clock position.  There was a second, 0.6 cm lesion at the 10:00 radiant which has not been biopsied.  Suspicious focal cortical thickening of a single right axillary lymph node.  Accordingly on 02/25/2018 she proceeded to biopsy of the right breast area in question. The pathology from this procedure showed ([id number]): Breast, right, needle core biopsy, OUQ with microscopic focus of invasive ductal carcinoma, grade 2, arising in a background of high grade ductal carcinoma in situ. 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   The patient's subsequent history is as detailed below.    PAST MEDICAL HISTORY: Past Medical History:  Diagnosis Date  . Arthritis   . Asthma   . breast ca dx'd 02/2018   right  . Depression     PAST SURGICAL HISTORY: Past Surgical History:  Procedure Laterality Date  . endometrial ablation    . IR IMAGING GUIDED PORT INSERTION  03/18/2018  . MYOMECTOMY      FAMILY HISTORY Family History  Problem Relation Age of Onset  . Lupus Sister   . Heart disease Sister   . Breast cancer Mother    She notes that her  father is currently 26 years old. Patients' mother is currently 43 years old and she was diagnosed with breast cancer at age 60. The patient has 4 brothers and 1 sister. She has a maternal aunt with breast cancer and her maternal great grandmother with had ovarian cancer.    GYNECOLOGIC HISTORY:  No LMP recorded. Patient is postmenopausal. Menarche: 57 years  old Age at first live birth: 57 years old [place] P1 LMP: 13-14 years ago s/p ablation Contraceptive: no HRT: no  Hysterectomy: no SO: no  SOCIAL HISTORY: She is currently unemployed.  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 She lives alone without pets. Her daughter is Jacqueline Good who lives in [place] and works in Therapist, art.  The patient has one grandchild. She goes to a [employer].      ADVANCED DIRECTIVES: Not in place.  At the 03/03/2018 visit the patient was given the appropriate documents to complete and notarized at her discretion   HEALTH MAINTENANCE: Social History   Tobacco Use  . Smoking status: Current Every Day Smoker    Packs/day: 1.00    Years: 20.00    Pack years: 20.00    Types: Cigarettes  . Smokeless tobacco: Never Used  Substance Use Topics  . Alcohol use: Yes    Comment: occasionally  . Drug use: No     Colonoscopy: Never  PAP:   Bone density: Never   Allergies  Allergen Reactions  . Gadolinium Derivatives Itching and Cough    Pt began sneezing and coughing as soon as MRI contrast was injected. Severe nasal congestion. No rash or hives no SOB.     Current Outpatient Medications  Medication Sig Dispense Refill  . albuterol (VENTOLIN HFA) 108 (90 Base) MCG/ACT inhaler INHALE 2 PUFFS INTO THE LUNGS EVERY 6 HOURS AS NEEDED FOR WHEEZING 54 g 3  . ALPRAZolam (XANAX) 1 MG tablet Take 1 mg by mouth 3 (three) times daily as needed. for anxiety  2  . busPIRone (BUSPAR) 30 MG tablet Take 30 mg by mouth 2 (two) times daily.  2  . cetirizine-pseudoephedrine (ZYRTEC-D) 5-120 MG tablet Take 1 tablet by mouth daily. 15 tablet 0  . cholestyramine (QUESTRAN) 4 g packet Take 1 packet (4 g total) by mouth 2 (two) times daily. 60 each 12  . dexamethasone (DECADRON) 4 MG tablet Take 2 tablets (8 mg total) by mouth 2 (two) times daily. Start the day before Taxotere. Take once the day after, then 2 times a day x 2d. 30 tablet 1  .  DULoxetine (CYMBALTA) 60 MG capsule TAKE 2 CAPSULES BY MOUTH DAILY IN THE MORNING.  5  . DULoxetine HCl (CYMBALTA PO) Take by mouth.    . esomeprazole (NEXIUM) 40 MG capsule Take 1 capsule (40 mg total) by mouth daily. 30 capsule 5  . fluticasone (FLONASE) 50 MCG/ACT nasal spray Place 2 sprays into both nostrils daily. 1 g 0  . Fluticasone-Salmeterol (ADVAIR DISKUS) 250-50 MCG/DOSE AEPB INHALE 1 PUFF INTO THE LUNGS 2 TIMES DAILY. 60 each 2  . lidocaine-prilocaine (EMLA) cream Apply to affected area once 30 g 3  . LORazepam (ATIVAN) 0.5 MG tablet Take 1 tablet (0.5 mg total) by mouth at bedtime as needed (Nausea or vomiting). 30 tablet 0  . Melatonin 5 MG TABS Take 1 tablet by mouth at bedtime.    . naproxen (NAPROSYN) 500 MG tablet Take 1 tablet (500 mg total) by mouth 2 (two) times daily with a meal. 60 tablet 3  .  prochlorperazine (COMPAZINE) 10 MG tablet Take 1 tablet (10 mg total) by mouth every 6 (six) hours as needed (Nausea or vomiting). 30 tablet 1  . venlafaxine (EFFEXOR) 75 MG tablet Take 75 mg by mouth 2 (two) times daily.    Marland Kitchen zolpidem (AMBIEN) 10 MG tablet Take 10 mg by mouth at bedtime as needed for sleep.     No current facility-administered medications for this visit.     OBJECTIVE:  Vitals:   04/20/18 1335  BP: 105/73  Pulse: 87  Resp: 18  Temp: 98.5 F (36.9 C)  SpO2: 97%     Body mass index is 33.33 kg/m.   Wt Readings from Last 3 Encounters:  04/20/18 200 lb 4.8 oz (90.9 kg)  04/13/18 211 lb (95.7 kg)  03/31/18 207 lb 12.8 oz (94.3 kg)      ECOG FS:1 - Symptomatic but completely ambulatory GENERAL: Patient is a well appearing female in no acute distress HEENT:  Sclerae anicteric.  Oropharynx clear and moist. No ulcerations or evidence of oropharyngeal candidiasis. Neck is supple.  NODES:  No cervical, supraclavicular, or axillary lymphadenopathy palpated.  BREAST EXAM:  Ecchymosis noted on right breast in upper outer quadrant.  No sign of infection noted.   Limited exam due to bruising, left breast without nodules, masses, skin or nipple changes. LUNGS:  Clear to auscultation bilaterally.  No wheezes or rhonchi. HEART:  Regular rate and rhythm. No murmur appreciated. ABDOMEN:  Soft, nontender.  Positive, normoactive bowel sounds. No organomegaly palpated. MSK:  No focal spinal tenderness to palpation. Full range of motion bilaterally in the upper extremities. EXTREMITIES:  No peripheral edema.   SKIN:  Clear with no obvious rashes or skin changes. No nail dyscrasia. NEURO:  Nonfocal. Well oriented.  Appropriate affect.    LAB RESULTS:  CMP     Component Value Date/Time   NA 141 04/13/2018 1115   K 4.0 04/13/2018 1115   CL 107 04/13/2018 1115   CO2 26 04/13/2018 1115   GLUCOSE 90 04/13/2018 1115   BUN 14 04/13/2018 1115   CREATININE 0.90 04/13/2018 1115   CREATININE 0.80 03/03/2018 0830   CREATININE 0.83 09/24/2016 1151   CALCIUM 9.4 04/13/2018 1115   PROT 6.5 04/13/2018 1115   ALBUMIN 3.6 04/13/2018 1115   AST 18 04/13/2018 1115   AST 15 03/03/2018 0830   ALT 29 04/13/2018 1115   ALT 16 03/03/2018 0830   ALKPHOS 112 04/13/2018 1115   BILITOT 0.6 04/13/2018 1115   BILITOT 0.5 03/03/2018 0830   GFRNONAA >60 04/13/2018 1115   GFRNONAA >60 03/03/2018 0830   GFRNONAA 79 09/24/2016 1151   GFRAA >60 04/13/2018 1115   GFRAA >60 03/03/2018 0830   GFRAA >89 09/24/2016 1151    No results found for: TOTALPROTELP, ALBUMINELP, A1GS, A2GS, BETS, BETA2SER, GAMS, MSPIKE, SPEI  No results found for: KPAFRELGTCHN, LAMBDASER, KAPLAMBRATIO  Lab Results  Component Value Date   WBC 7.5 04/13/2018   NEUTROABS 5.6 04/13/2018   HGB 11.3 (L) 04/13/2018   HCT 34.0 (L) 04/13/2018   MCV 87.9 04/13/2018   PLT 283 04/13/2018    @LASTCHEMISTRY @  No results found for: LABCA2  No components found for: QDIYME158  No results for input(s): INR in the last 168 hours.  No results found for: LABCA2  No results found for: XEN407  No results  found for: WKG881  No results found for: JSR159  No results found for: CA2729  No components found for: HGQUANT  No results found  for: CEA1 / No results found for: CEA1   No results found for: AFPTUMOR  No results found for: CHROMOGRNA  No results found for: PSA1  No visits with results within 3 Day(s) from this visit.  Latest known visit with results is:  Appointment on 04/13/2018  Component Date Value Ref Range Status  . WBC 04/13/2018 7.5  3.9 - 10.3 K/uL Final  . RBC 04/13/2018 3.88  3.70 - 5.45 MIL/uL Final  . Hemoglobin 04/13/2018 11.3* 11.6 - 15.9 g/dL Final  . HCT 04/13/2018 34.0* 34.8 - 46.6 % Final  . MCV 04/13/2018 87.9  79.5 - 101.0 fL Final  . MCH 04/13/2018 29.1  25.1 - 34.0 pg Final  . MCHC 04/13/2018 33.1  31.5 - 36.0 g/dL Final  . RDW 04/13/2018 14.3  11.2 - 14.5 % Final  . Platelets 04/13/2018 283  145 - 400 K/uL Final  . Neutrophils Relative % 04/13/2018 74  % Final  . Neutro Abs 04/13/2018 5.6  1.5 - 6.5 K/uL Final  . Lymphocytes Relative 04/13/2018 18  % Final  . Lymphs Abs 04/13/2018 1.4  0.9 - 3.3 K/uL Final  . Monocytes Relative 04/13/2018 6  % Final  . Monocytes Absolute 04/13/2018 0.4  0.1 - 0.9 K/uL Final  . Eosinophils Relative 04/13/2018 1  % Final  . Eosinophils Absolute 04/13/2018 0.1  0.0 - 0.5 K/uL Final  . Basophils Relative 04/13/2018 1  % Final  . Basophils Absolute 04/13/2018 0.1  0.0 - 0.1 K/uL Final   Performed at [facility], [place] [address], [place], [place] [address]  . Sodium 04/13/2018 141  135 - 145 mmol/L Final  . Potassium 04/13/2018 4.0  3.5 - 5.1 mmol/L Final  . Chloride 04/13/2018 107  98 - 111 mmol/L Final  . CO2 04/13/2018 26  22 - 32 mmol/L Final  . Glucose, Bld 04/13/2018 90  70 - 99 mg/dL Final  . BUN 04/13/2018 14  6 - 20 mg/dL Final  . Creatinine, Ser 04/13/2018 0.90  0.44 - 1.00 mg/dL Final  . Calcium 04/13/2018 9.4  8.9 - 10.3 mg/dL Final  . Total Protein 04/13/2018 6.5  6.5 - 8.1 g/dL  Final  . Albumin 04/13/2018 3.6  3.5 - 5.0 g/dL Final  . AST 04/13/2018 18  15 - 41 U/L Final  . ALT 04/13/2018 29  0 - 44 U/L Final  . Alkaline Phosphatase 04/13/2018 112  38 - 126 U/L Final  . Total Bilirubin 04/13/2018 0.6  0.3 - 1.2 mg/dL Final  . GFR calc non Af Amer 04/13/2018 >60  >60 mL/min Final  . GFR calc Af Amer 04/13/2018 >60  >60 mL/min Final   Comment: (NOTE) The eGFR has been calculated using the CKD EPI equation. This calculation has not been validated in all clinical situations. eGFR's persistently <60 mL/min signify possible Chronic Kidney Disease.   Georgiann Hahn gap 04/13/2018 8  5 - 15 Final   Performed at [facility], [place] [address], [place], [place] [address]    (this displays the last labs from the last 3 days)  No results found for: TOTALPROTELP, ALBUMINELP, A1GS, A2GS, BETS, BETA2SER, GAMS, MSPIKE, SPEI (this displays SPEP labs)  No results found for: KPAFRELGTCHN, LAMBDASER, KAPLAMBRATIO (kappa/lambda light chains)  No results found for: HGBA, HGBA2QUANT, HGBFQUANT, HGBSQUAN (Hemoglobinopathy evaluation)   No results found for: LDH  No results found for: IRON, TIBC, IRONPCTSAT (Iron and TIBC)  No results found for: FERRITIN  Urinalysis  Component Value Date/Time   COLORURINE CANCELED 09/24/2016 1151   APPEARANCEUR CANCELED 09/24/2016 1151   LABSPEC CANCELED 09/24/2016 1151   PHURINE CANCELED 09/24/2016 1151   GLUCOSEU CANCELED 09/24/2016 1151   HGBUR CANCELED 09/24/2016 1151   BILIRUBINUR CANCELED 09/24/2016 1151   KETONESUR CANCELED 09/24/2016 1151   PROTEINUR CANCELED 09/24/2016 1151   NITRITE CANCELED 09/24/2016 1151   LEUKOCYTESUR CANCELED 09/24/2016 1151     STUDIES:  Mm Clip Placement Right  Result Date: 03/31/2018 CLINICAL DATA:  Biopsy proven invasive mammary carcinoma and ductal carcinoma in-situ in the 10 o'clock region of the right breast. MRI showed additional non masslike enhancement in the right  breast. MR guided core biopsies performed for extent of disease. EXAM: DIAGNOSTIC RIGHT MAMMOGRAM POST MRI BIOPSY COMPARISON:  Previous exam(s). FINDINGS: Mammographic images were obtained following MR guided biopsies of the right breast. Mammographic images show there is a dumbbell-shaped clip in the posterior third of the upper-outer quadrant of the right breast. It is located 3.2 cm posterior to the ribbon shaped clip (biopsy proven invasive mammary carcinoma and ductal carcinoma in-situ). There is a cylindrical shaped clip located in the anterior third of the upper-outer quadrant of the right breast 3.8 cm anterior to the ribbon shaped clip. IMPRESSION: Status post MR guided core biopsies of the right breast with pathology pending. Final Assessment: Post Procedure Mammograms for Marker Placement Electronically Signed   By: Lillia Mountain M.D.   On: 03/31/2018 10:09   Mr Rt Breast Bx W Loc Dev 1st Lesion Image Bx Spec Mr Guide  Addendum Date: 04/05/2018   ADDENDUM REPORT: 04/02/2018 10:49 ADDENDUM: Pathology revealed HIGH GRADE DUCTAL CARCINOMA IN SITU WITH NECROSIS of RIGHT breast, posterior. This was found to be concordant by Dr. Lillia Mountain. Pathology revealed HIGH GRADE DUCTAL CARCINOMA IN SITU WITH FOCI SUSPICIOUS FOR MICROINVASION of RIGHT breast, anterior. This was found to be concordant by Dr. Lillia Mountain. Pathology results were discussed with the patient by telephone. The patient reported doing well after the biopsy with tenderness at the site. Post biopsy instructions and care were reviewed and questions were answered. The patient was encouraged to call [facility] for any additional concerns. The patient has a recent diagnosis of RIGHT breast cancer and should follow her outlined treatment plan. Dr Lurline Del of [facility] and Dr Alphonsa Overall of [facility], both in [place], [place], have been notified of the biopsy results via Epic message.  Pathology results reported by Roselind Messier, RN on 04/02/2018. Electronically Signed   By: Lillia Mountain M.D.   On: 04/02/2018 10:49   Result Date: 04/05/2018 CLINICAL DATA:  Biopsy proven invasive ductal carcinoma and high-grade ductal carcinoma in-situ in the 10 o'clock region of the right breast. MRI showed additional non masslike enhancement extending anterior and posterior to the mass. MR guided core biopsies of the anterior and posterior enhancement requested. EXAM: MRI GUIDED CORE NEEDLE BIOPSY OF THE RIGHT BREAST TECHNIQUE: Multiplanar, multisequence MR imaging of the right breast was performed both before and after administration of intravenous contrast. CONTRAST:  17m MULTIHANCE GADOBENATE DIMEGLUMINE 529 MG/ML IV SOLN COMPARISON:  Previous exams. FINDINGS: I met with the patient, and we discussed the procedure of MRI guided biopsy, including risks, benefits, and alternatives. Specifically, we discussed the risks of infection, bleeding, tissue injury, clip migration, and inadequate sampling. Informed, written consent was given. The usual time out protocol was performed immediately prior to the procedure. Using sterile technique, 1% Lidocaine,  MRI guidance, and a 9 gauge vacuum assisted device, biopsy was performed of posterior enhancement in the upper-outer quadrant using a lateral to medial approach. At the conclusion of the procedure, a dumbbell-shaped tissue marker clip was deployed into the biopsy cavity. Follow-up 2-view mammogram was performed and dictated separately. I met with the patient, and we discussed the procedure of MRI guided biopsy, including risks, benefits, and alternatives. Specifically, we discussed the risks of infection, bleeding, tissue injury, clip migration, and inadequate sampling. Informed, written consent was given. The usual time out protocol was performed immediately prior to the procedure. Using sterile technique, 1% Lidocaine, MRI guidance, and a 9 gauge vacuum assisted  device, biopsy was performed of anterior enhancement in the upper-outer quadrant using a lateral to medial approach. At the conclusion of the procedure, a cylindrical shaped tissue marker clip was deployed into the biopsy cavity. Follow-up 2-view mammogram was performed and dictated separately. IMPRESSION: MRI guided biopsies of the right breast.  No apparent complications. Electronically Signed: By: Lillia Mountain M.D. On: 03/31/2018 10:04   Mr Rt Breast Bx W Loc Dev Ea Add Lesion Image Bx Spec Mr Guide  Addendum Date: 04/05/2018   ADDENDUM REPORT: 04/02/2018 10:49 ADDENDUM: Pathology revealed HIGH GRADE DUCTAL CARCINOMA IN SITU WITH NECROSIS of RIGHT breast, posterior. This was found to be concordant by Dr. Lillia Mountain. Pathology revealed HIGH GRADE DUCTAL CARCINOMA IN SITU WITH FOCI SUSPICIOUS FOR MICROINVASION of RIGHT breast, anterior. This was found to be concordant by Dr. Lillia Mountain. Pathology results were discussed with the patient by telephone. The patient reported doing well after the biopsy with tenderness at the site. Post biopsy instructions and care were reviewed and questions were answered. The patient was encouraged to call [facility] for any additional concerns. The patient has a recent diagnosis of RIGHT breast cancer and should follow her outlined treatment plan. Dr Lurline Del of [facility] and Dr Alphonsa Overall of [facility], both in [place], [place], have been notified of the biopsy results via Epic message. Pathology results reported by Roselind Messier, RN on 04/02/2018. Electronically Signed   By: Lillia Mountain M.D.   On: 04/02/2018 10:49   Result Date: 04/05/2018 CLINICAL DATA:  Biopsy proven invasive ductal carcinoma and high-grade ductal carcinoma in-situ in the 10 o'clock region of the right breast. MRI showed additional non masslike enhancement extending anterior and posterior to the mass. MR guided core biopsies of the anterior and  posterior enhancement requested. EXAM: MRI GUIDED CORE NEEDLE BIOPSY OF THE RIGHT BREAST TECHNIQUE: Multiplanar, multisequence MR imaging of the right breast was performed both before and after administration of intravenous contrast. CONTRAST:  28m MULTIHANCE GADOBENATE DIMEGLUMINE 529 MG/ML IV SOLN COMPARISON:  Previous exams. FINDINGS: I met with the patient, and we discussed the procedure of MRI guided biopsy, including risks, benefits, and alternatives. Specifically, we discussed the risks of infection, bleeding, tissue injury, clip migration, and inadequate sampling. Informed, written consent was given. The usual time out protocol was performed immediately prior to the procedure. Using sterile technique, 1% Lidocaine, MRI guidance, and a 9 gauge vacuum assisted device, biopsy was performed of posterior enhancement in the upper-outer quadrant using a lateral to medial approach. At the conclusion of the procedure, a dumbbell-shaped tissue marker clip was deployed into the biopsy cavity. Follow-up 2-view mammogram was performed and dictated separately. I met with the patient, and we discussed the procedure of MRI guided biopsy, including risks, benefits, and alternatives. Specifically,  we discussed the risks of infection, bleeding, tissue injury, clip migration, and inadequate sampling. Informed, written consent was given. The usual time out protocol was performed immediately prior to the procedure. Using sterile technique, 1% Lidocaine, MRI guidance, and a 9 gauge vacuum assisted device, biopsy was performed of anterior enhancement in the upper-outer quadrant using a lateral to medial approach. At the conclusion of the procedure, a cylindrical shaped tissue marker clip was deployed into the biopsy cavity. Follow-up 2-view mammogram was performed and dictated separately. IMPRESSION: MRI guided biopsies of the right breast.  No apparent complications. Electronically Signed: By: Lillia Mountain M.D. On: 03/31/2018  10:04    ELIGIBLE FOR AVAILABLE RESEARCH PROTOCOL:BCEP  ASSESSMENT: 57 y.o. [place] woman status post right breast upper outer quadrant biopsy 02/25/2018 for a clinical T2 pN1, stage IIB invasive ductal carcinoma, estrogen and progesterone receptor negative, HER-2 amplified, with an MIB-1 of 50%.  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  (4) definitive surgery with targeted axillary dissection to follow  (5) adjuvant radiation  (6) left adrenal adenoma measuring 2.9 cm on CT of the chest 03/16/2018  PLAN:  Medrith is doing moderately well today. We reviewed her labs today and her kidney function is increased.  This is likely secondary to dehydration from her diarrhea.  I recommended IV fluids, however she declined.  I did send in Lomotil for her to take.  I also sent in some Ondansetron for her nausea.    Meliss's CBC is normal today.  She and I reviewed her overall treatment plan.  I am delighted she has an appointment with Dr. Iran Planas in a few days to review reconstruction.  She will return in two weeks for labs, f/u and her next appointment.     She knows to call for any concerns that may arise before then, and I highlighted for her to call if she continues to have diarrhea.     A total of (30) minutes of face-to-face time was spent with this patient with greater than 50% of that time in counseling and care-coordination.    Wilber Bihari, NP  04/20/18 1:57 PM Medical Oncology and Hematology [facility] [address] [place], [place] [address] Tel.  [phone]    Fax. [phone]

## 2018-04-20 NOTE — Telephone Encounter (Signed)
Faxed medical records to Dr. Iran Planas, Release ID: [id number]

## 2018-04-22 ENCOUNTER — Ambulatory Visit: Payer: Medicaid Other | Attending: Internal Medicine | Admitting: Internal Medicine

## 2018-04-22 ENCOUNTER — Encounter: Payer: Self-pay | Admitting: Internal Medicine

## 2018-04-22 VITALS — BP 108/75 | HR 75 | Temp 98.2°F | Resp 16 | Ht 64.0 in | Wt 201.4 lb

## 2018-04-22 DIAGNOSIS — F4322 Adjustment disorder with anxiety: Secondary | ICD-10-CM | POA: Insufficient documentation

## 2018-04-22 DIAGNOSIS — F17210 Nicotine dependence, cigarettes, uncomplicated: Secondary | ICD-10-CM | POA: Diagnosis not present

## 2018-04-22 DIAGNOSIS — Z171 Estrogen receptor negative status [ER-]: Secondary | ICD-10-CM | POA: Insufficient documentation

## 2018-04-22 DIAGNOSIS — G8929 Other chronic pain: Secondary | ICD-10-CM | POA: Diagnosis present

## 2018-04-22 DIAGNOSIS — M1990 Unspecified osteoarthritis, unspecified site: Secondary | ICD-10-CM | POA: Insufficient documentation

## 2018-04-22 DIAGNOSIS — H8110 Benign paroxysmal vertigo, unspecified ear: Secondary | ICD-10-CM | POA: Insufficient documentation

## 2018-04-22 DIAGNOSIS — C50411 Malignant neoplasm of upper-outer quadrant of right female breast: Secondary | ICD-10-CM | POA: Insufficient documentation

## 2018-04-22 DIAGNOSIS — Z803 Family history of malignant neoplasm of breast: Secondary | ICD-10-CM | POA: Insufficient documentation

## 2018-04-22 DIAGNOSIS — J45909 Unspecified asthma, uncomplicated: Secondary | ICD-10-CM | POA: Diagnosis not present

## 2018-04-22 DIAGNOSIS — F329 Major depressive disorder, single episode, unspecified: Secondary | ICD-10-CM | POA: Diagnosis not present

## 2018-04-22 DIAGNOSIS — C50811 Malignant neoplasm of overlapping sites of right female breast: Secondary | ICD-10-CM | POA: Diagnosis present

## 2018-04-22 DIAGNOSIS — Z79899 Other long term (current) drug therapy: Secondary | ICD-10-CM | POA: Insufficient documentation

## 2018-04-22 DIAGNOSIS — M545 Low back pain: Secondary | ICD-10-CM | POA: Insufficient documentation

## 2018-04-22 NOTE — Progress Notes (Signed)
Patient ID: Jacqueline Good, female    DOB: 1961-02-16  MRN: [id number]  CC: re-establish and Medication Refill   Subjective: Jacqueline Good is a 57 y.o. female who presents for chronic disease management and to establish with me as PCP. 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  Patient presents today to establish with me as PCP.  Previous PCP was Dr. check a day.  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  She is under the care of oncologist Jacqueline Good.  Currently receiving neoadjuvant chemotherapy with definitive surgical management upon completion.   Today patient is very emotional.  She is tearful and does not wish to talk much.  She is having a hard time emotionally dealing with the diagnosis, loss of her hair and mastectomy which is to come.  She has good support from her mother who is a breast cancer survivor.  She also has a daughter.  She is plugged into mental health with Jacqueline Good through [facility].  She missed her last appointment because she had so much going on.  Patient Active Problem List   Diagnosis Date Noted  . Malignant neoplasm of upper-outer quadrant of right breast in female, estrogen receptor negative ([place]) 03/02/2018  . Needs flu shot 06/03/2017  . Chronic bilateral low back pain without sciatica 06/03/2017  . Adjustment disorder with anxious mood 03/27/2016  . Benign paroxysmal positional vertigo 10/31/2014  . Arthritis 04/27/2014  . Fatigue 06/10/2013  . Morbid obesity ([place]) 06/10/2013  . Asthma, chronic obstructive, with acute exacerbation ([place]) 06/10/2013  . Allergy 06/10/2013     Current Outpatient Medications on File Prior to Visit  Medication Sig Dispense Refill  . albuterol (VENTOLIN HFA) 108 (90 Base)  MCG/ACT inhaler INHALE 2 PUFFS INTO THE LUNGS EVERY 6 HOURS AS NEEDED FOR WHEEZING 54 g 3  . ALPRAZolam (XANAX) 1 MG tablet Take 1 mg by mouth 3 (three) times daily as needed. for anxiety  2  . busPIRone (BUSPAR) 30 MG tablet Take 30 mg by mouth 2 (two) times daily.  2  . cetirizine-pseudoephedrine (ZYRTEC-D) 5-120 MG tablet Take 1 tablet by mouth daily. 15 tablet 0  . cholestyramine (QUESTRAN) 4 g packet Take 1 packet (4 g total) by mouth 2 (two) times daily. 60 each 12  . dexamethasone (DECADRON) 4 MG tablet Take 2 tablets (8 mg total) by mouth 2 (two) times daily. Start the day before Taxotere. Take once the day after, then 2 times a day x 2d. 30 tablet 1  . diphenoxylate-atropine (LOMOTIL) 2.5-0.025 MG tablet Take 1 tablet by mouth 4 (four) times daily as needed for diarrhea or loose stools. 30 tablet 0  . DULoxetine (CYMBALTA) 60 MG capsule TAKE 2 CAPSULES BY MOUTH DAILY IN THE MORNING.  5  . esomeprazole (NEXIUM) 40 MG capsule Take 1 capsule (40 mg total) by mouth daily. 30 capsule 5  . fluticasone (FLONASE) 50 MCG/ACT nasal spray Place 2 sprays into both nostrils daily. 1 g 0  . Fluticasone-Salmeterol (ADVAIR DISKUS) 250-50 MCG/DOSE AEPB INHALE 1 PUFF INTO THE LUNGS 2 TIMES DAILY. 60 each 2  . lidocaine-prilocaine (EMLA) cream Apply to affected area once 30 g 3  . LORazepam (ATIVAN) 0.5 MG tablet Take 1 tablet (0.5 mg total) by  mouth at bedtime as needed (Nausea or vomiting). 30 tablet 0  . Melatonin 5 MG TABS Take 1 tablet by mouth at bedtime.    . naproxen (NAPROSYN) 500 MG tablet Take 1 tablet (500 mg total) by mouth 2 (two) times daily with a meal. 60 tablet 3  . ondansetron (ZOFRAN) 8 MG tablet Take 1 tablet (8 mg total) by mouth every 8 (eight) hours as needed for nausea or vomiting. Do not take the first three days after chemo 20 tablet 0  . prochlorperazine (COMPAZINE) 10 MG tablet Take 1 tablet (10 mg total) by mouth every 6 (six) hours as needed (Nausea or vomiting). 30 tablet 1  .  venlafaxine (EFFEXOR) 75 MG tablet Take 75 mg by mouth 2 (two) times daily.    Marland Kitchen zolpidem (AMBIEN) 10 MG tablet Take 10 mg by mouth at bedtime as needed for sleep.     No current facility-administered medications on file prior to visit.     Allergies  Allergen Reactions  . Gadolinium Derivatives Itching and Cough    Pt began sneezing and coughing as soon as MRI contrast was injected. Severe nasal congestion. No rash or hives no SOB.     Social History   Socioeconomic History  . Marital status: Single    Spouse name: Not on file  . Number of children: Not on file  . Years of education: Not on file  . Highest education level: Not on file  Occupational History  . Not on file  Social Needs  . Financial resource strain: Not on file  . Food insecurity:    Worry: Not on file    Inability: Not on file  . Transportation needs:    Medical: Not on file    Non-medical: Not on file  Tobacco Use  . Smoking status: Current Every Day Smoker    Packs/day: 1.00    Years: 20.00    Pack years: 20.00    Types: Cigarettes  . Smokeless tobacco: Never Used  Substance and Sexual Activity  . Alcohol use: Yes    Comment: occasionally  . Drug use: No  . Sexual activity: Not Currently    Birth control/protection: None  Lifestyle  . Physical activity:    Days per week: Not on file    Minutes per session: Not on file  . Stress: Not on file  Relationships  . Social connections:    Talks on phone: Not on file    Gets together: Not on file    Attends religious service: Not on file    Active member of club or organization: Not on file    Attends meetings of clubs or organizations: Not on file    Relationship status: Not on file  . Intimate partner violence:    Fear of current or ex partner: Not on file    Emotionally abused: Not on file    Physically abused: Not on file    Forced sexual activity: Not on file  Other Topics Concern  . Not on file  Social History Narrative  . Not on file     Family History  Problem Relation Age of Onset  . Lupus Sister   . Heart disease Sister   . Breast cancer Mother     Past Surgical History:  Procedure Laterality Date  . endometrial ablation    . IR IMAGING GUIDED PORT INSERTION  03/18/2018  . MYOMECTOMY      ROS: Review of Systems Negative except as above  PHYSICAL EXAM: BP 108/75   Pulse 75   Temp 98.2 F (36.8 C) (Oral)   Resp 16   Ht 5' 4"  (1.626 m)   Wt 201 lb 6.4 oz (91.4 kg)   SpO2 96%   BMI 34.57 kg/m   Wt Readings from Last 3 Encounters:  04/22/18 201 lb 6.4 oz (91.4 kg)  04/20/18 200 lb 4.8 oz (90.9 kg)  04/13/18 211 lb (95.7 kg)   Physical Exam  General appearance -patient in NAD.   Mental status - alert, oriented.  She is very tearful.   Lab Results  Component Value Date   WBC 8.1 04/20/2018   HGB 12.4 04/20/2018   HCT 37.6 04/20/2018   MCV 87.2 04/20/2018   PLT 154 04/20/2018     Chemistry      Component Value Date/Time   NA 139 04/20/2018 1506   K 3.5 04/20/2018 1506   CL 104 04/20/2018 1506   CO2 21 (L) 04/20/2018 1506   BUN 23 (H) 04/20/2018 1506   CREATININE 1.34 (H) 04/20/2018 1506   CREATININE 0.80 03/03/2018 0830   CREATININE 0.83 09/24/2016 1151      Component Value Date/Time   CALCIUM 10.0 04/20/2018 1506   ALKPHOS 143 (H) 04/20/2018 1506   AST 31 04/20/2018 1506   AST 15 03/03/2018 0830   ALT 72 (H) 04/20/2018 1506   ALT 16 03/03/2018 0830   BILITOT 0.6 04/20/2018 1506   BILITOT 0.5 03/03/2018 0830     Depression screen [facility] 2/9 04/22/2018 06/03/2017 09/24/2016  Decreased Interest 2 0 1  Down, Depressed, Hopeless 3 0 1  PHQ - 2 Score 5 0 2  Altered sleeping 3 1 3   Tired, decreased energy 2 0 0  Change in appetite 2 0 0  Feeling bad or failure about yourself  0 0 0  Trouble concentrating 3 3 2   Moving slowly or fidgety/restless 0 0 0  Suicidal thoughts 0 0 0  PHQ-9 Score 15 4 7     ASSESSMENT AND PLAN: 1. Reactive depression -Encourage patient to keep up with her  therapist as she will probably need that support now more than ever.  Encouraged her to also consider joining a support group of patients who are dealing with or our survivors of breast cancer. Encourage her to maintain good nutrition as she goes through chemotherapy Patient declined meeting with our LCSW today 2. Malignant neoplasm of upper-outer quadrant of right breast in female, estrogen receptor negative ([place])   Patient was given the opportunity to ask questions.  Patient verbalized understanding of the plan and was able to repeat key elements of the plan.   No orders of the defined types were placed in this encounter.    Requested Prescriptions    No prescriptions requested or ordered in this encounter    No follow-ups on file.  Karle Plumber, MD, FACP

## 2018-04-22 NOTE — Progress Notes (Signed)
Pt states she has pain but it's not worth talking about

## 2018-05-03 NOTE — Assessment & Plan Note (Signed)
57 y.o. [place] woman status post right breast upper outer quadrant biopsy 02/25/2018 for a clinical T2 pN1, stage IIB invasive ductal carcinoma, estrogen and progesterone receptor negative, HER-2 amplified, with an MIB-1 of 50%.             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   (1) genetics testing pending  (2) neoadjuvant chemotherapy will consist of carboplatin, docetaxel, trastuzumab and Pertuzumab every 21 days x 6 starting 03/23/2018             (a) pertuzumab omitted from cycle 2  (3) anti-HER-2 treatment will continue for a minimum of 6 months             (a) baseline echocardiogram 03/16/2018 shows an ejection fraction in the 55-60% range.  (4) definitive surgery with targeted axillary dissection to follow  (5) adjuvant radiation  (6) left adrenal adenoma measuring 2.9 cm on CT of the chest 03/16/2018  PLAN:

## 2018-05-04 ENCOUNTER — Other Ambulatory Visit: Payer: No Typology Code available for payment source

## 2018-05-04 ENCOUNTER — Inpatient Hospital Stay: Payer: Medicaid Other

## 2018-05-04 ENCOUNTER — Inpatient Hospital Stay (HOSPITAL_BASED_OUTPATIENT_CLINIC_OR_DEPARTMENT_OTHER): Payer: Medicaid Other | Admitting: Hematology and Oncology

## 2018-05-04 DIAGNOSIS — C50411 Malignant neoplasm of upper-outer quadrant of right female breast: Secondary | ICD-10-CM

## 2018-05-04 DIAGNOSIS — Z171 Estrogen receptor negative status [ER-]: Secondary | ICD-10-CM

## 2018-05-04 DIAGNOSIS — D3502 Benign neoplasm of left adrenal gland: Secondary | ICD-10-CM

## 2018-05-04 DIAGNOSIS — E860 Dehydration: Secondary | ICD-10-CM

## 2018-05-04 DIAGNOSIS — R197 Diarrhea, unspecified: Secondary | ICD-10-CM | POA: Diagnosis not present

## 2018-05-04 DIAGNOSIS — Z5111 Encounter for antineoplastic chemotherapy: Secondary | ICD-10-CM | POA: Diagnosis not present

## 2018-05-04 DIAGNOSIS — J441 Chronic obstructive pulmonary disease with (acute) exacerbation: Secondary | ICD-10-CM

## 2018-05-04 DIAGNOSIS — J45901 Unspecified asthma with (acute) exacerbation: Secondary | ICD-10-CM

## 2018-05-04 LAB — CBC WITH DIFFERENTIAL/PLATELET
Basophils Absolute: 0 10*3/uL (ref 0.0–0.1)
Basophils Relative: 1 %
EOS PCT: 0 %
Eosinophils Absolute: 0 10*3/uL (ref 0.0–0.5)
HCT: 31.7 % — ABNORMAL LOW (ref 34.8–46.6)
HEMOGLOBIN: 10.6 g/dL — AB (ref 11.6–15.9)
LYMPHS ABS: 1.3 10*3/uL (ref 0.9–3.3)
LYMPHS PCT: 18 %
MCH: 29.4 pg (ref 25.1–34.0)
MCHC: 33.5 g/dL (ref 31.5–36.0)
MCV: 87.9 fL (ref 79.5–101.0)
Monocytes Absolute: 0.5 10*3/uL (ref 0.1–0.9)
Monocytes Relative: 7 %
NEUTROS PCT: 74 %
Neutro Abs: 5.6 10*3/uL (ref 1.5–6.5)
Platelets: 370 10*3/uL (ref 145–400)
RBC: 3.61 MIL/uL — AB (ref 3.70–5.45)
RDW: 15.4 % — ABNORMAL HIGH (ref 11.2–14.5)
WBC: 7.5 10*3/uL (ref 3.9–10.3)

## 2018-05-04 LAB — COMPREHENSIVE METABOLIC PANEL
ALK PHOS: 105 U/L (ref 38–126)
ALT: 25 U/L (ref 0–44)
AST: 17 U/L (ref 15–41)
Albumin: 3.7 g/dL (ref 3.5–5.0)
Anion gap: 8 (ref 5–15)
BILIRUBIN TOTAL: 0.5 mg/dL (ref 0.3–1.2)
BUN: 21 mg/dL — ABNORMAL HIGH (ref 6–20)
CALCIUM: 9.2 mg/dL (ref 8.9–10.3)
CO2: 25 mmol/L (ref 22–32)
CREATININE: 0.87 mg/dL (ref 0.44–1.00)
Chloride: 108 mmol/L (ref 98–111)
Glucose, Bld: 94 mg/dL (ref 70–99)
Potassium: 3.4 mmol/L — ABNORMAL LOW (ref 3.5–5.1)
Sodium: 141 mmol/L (ref 135–145)
Total Protein: 7 g/dL (ref 6.5–8.1)

## 2018-05-04 MED ORDER — ACETAMINOPHEN 325 MG PO TABS
ORAL_TABLET | ORAL | Status: AC
  Filled 2018-05-04: qty 2

## 2018-05-04 MED ORDER — SODIUM CHLORIDE 0.9 % IV SOLN
Freq: Once | INTRAVENOUS | Status: AC
  Administered 2018-05-04: 11:00:00 via INTRAVENOUS
  Filled 2018-05-04: qty 250

## 2018-05-04 MED ORDER — DIPHENHYDRAMINE HCL 25 MG PO CAPS
ORAL_CAPSULE | ORAL | Status: AC
  Filled 2018-05-04: qty 1

## 2018-05-04 MED ORDER — ACETAMINOPHEN 325 MG PO TABS
650.0000 mg | ORAL_TABLET | Freq: Once | ORAL | Status: AC
  Administered 2018-05-04: 650 mg via ORAL

## 2018-05-04 MED ORDER — SODIUM CHLORIDE 0.9% FLUSH
10.0000 mL | INTRAVENOUS | Status: DC | PRN
  Administered 2018-05-04: 10 mL
  Filled 2018-05-04: qty 10

## 2018-05-04 MED ORDER — PALONOSETRON HCL INJECTION 0.25 MG/5ML
INTRAVENOUS | Status: AC
  Filled 2018-05-04: qty 5

## 2018-05-04 MED ORDER — DIPHENHYDRAMINE HCL 25 MG PO CAPS
25.0000 mg | ORAL_CAPSULE | Freq: Once | ORAL | Status: AC
  Administered 2018-05-04: 25 mg via ORAL

## 2018-05-04 MED ORDER — PALONOSETRON HCL INJECTION 0.25 MG/5ML
0.2500 mg | Freq: Once | INTRAVENOUS | Status: AC
  Administered 2018-05-04: 0.25 mg via INTRAVENOUS

## 2018-05-04 MED ORDER — SODIUM CHLORIDE 0.9 % IV SOLN
75.0000 mg/m2 | Freq: Once | INTRAVENOUS | Status: AC
  Administered 2018-05-04: 160 mg via INTRAVENOUS
  Filled 2018-05-04: qty 16

## 2018-05-04 MED ORDER — TRASTUZUMAB CHEMO 150 MG IV SOLR
600.0000 mg | Freq: Once | INTRAVENOUS | Status: AC
  Administered 2018-05-04: 600 mg via INTRAVENOUS
  Filled 2018-05-04: qty 28.57

## 2018-05-04 MED ORDER — HEPARIN SOD (PORK) LOCK FLUSH 100 UNIT/ML IV SOLN
500.0000 [IU] | Freq: Once | INTRAVENOUS | Status: AC | PRN
  Administered 2018-05-04: 500 [IU]
  Filled 2018-05-04: qty 5

## 2018-05-04 MED ORDER — SODIUM CHLORIDE 0.9 % IV SOLN
673.0000 mg | Freq: Once | INTRAVENOUS | Status: AC
  Administered 2018-05-04: 670 mg via INTRAVENOUS
  Filled 2018-05-04: qty 67

## 2018-05-04 MED ORDER — SODIUM CHLORIDE 0.9 % IV SOLN
Freq: Once | INTRAVENOUS | Status: AC
  Administered 2018-05-04: 12:00:00 via INTRAVENOUS
  Filled 2018-05-04: qty 5

## 2018-05-04 NOTE — Patient Instructions (Signed)
[facility] Discharge Instructions for Patients Receiving Chemotherapy  Today you received the following chemotherapy agents:  Herceptin, Taxotere, Carboplatin  To help prevent nausea and vomiting after your treatment, we encourage you to take your nausea medication as prescribed.   If you develop nausea and vomiting that is not controlled by your nausea medication, call the clinic.   BELOW ARE SYMPTOMS THAT SHOULD BE REPORTED IMMEDIATELY:  *FEVER GREATER THAN 100.5 F  *CHILLS WITH OR WITHOUT FEVER  NAUSEA AND VOMITING THAT IS NOT CONTROLLED WITH YOUR NAUSEA MEDICATION  *UNUSUAL SHORTNESS OF BREATH  *UNUSUAL BRUISING OR BLEEDING  TENDERNESS IN MOUTH AND THROAT WITH OR WITHOUT PRESENCE OF ULCERS  *URINARY PROBLEMS  *BOWEL PROBLEMS  UNUSUAL RASH Items with * indicate a potential emergency and should be followed up as soon as possible.  Feel free to call the clinic should you have any questions or concerns. The clinic phone number is [phone].  Please show the CHEMO ALERT CARD at check-in to the Emergency Department and triage nurse.   

## 2018-05-04 NOTE — Progress Notes (Signed)
Patient Care Team: Tresa Garter, MD as PCP - General (Internal Medicine) Alphonsa Overall, MD as Consulting Physician (General Surgery) [name], [name], MD as Consulting Physician (Oncology) Eppie Gibson, MD as Attending Physician (Radiation Oncology)  DIAGNOSIS:  Encounter Diagnosis  Name Primary?  . Malignant neoplasm of upper-outer quadrant of right breast in female, estrogen receptor negative ([place])     SUMMARY OF ONCOLOGIC HISTORY:   Malignant neoplasm of upper-outer quadrant of right breast in female, estrogen receptor negative ([place])   03/02/2018 Initial Diagnosis    Malignant neoplasm of upper-outer quadrant of right breast in female, estrogen receptor negative ([place])    03/22/2018 -  Chemotherapy    The patient had palonosetron (ALOXI) injection 0.25 mg, 0.25 mg, Intravenous,  Once, 2 of 6 cycles Administration: 0.25 mg (03/23/2018), 0.25 mg (04/13/2018) pegfilgrastim (NEULASTA) injection 6 mg, 6 mg, Subcutaneous, Once, 2 of 6 cycles Administration: 6 mg (03/25/2018), 6 mg (04/15/2018) trastuzumab (HERCEPTIN) 750 mg in sodium chloride 0.9 % 250 mL chemo infusion, 777 mg, Intravenous,  Once, 2 of 17 cycles Administration: 750 mg (03/23/2018), 600 mg (04/13/2018) CARBOplatin (PARAPLATIN) 720 mg in sodium chloride 0.9 % 250 mL chemo infusion, 720 mg (100 % of original dose 721 mg), Intravenous,  Once, 2 of 6 cycles Dose modification:   (original dose 721 mg, Cycle 1) Administration: 720 mg (03/23/2018), 650 mg (04/13/2018) DOCEtaxel (TAXOTERE) 160 mg in sodium chloride 0.9 % 250 mL chemo infusion, 75 mg/m2 = 160 mg, Intravenous,  Once, 2 of 6 cycles Administration: 160 mg (03/23/2018), 160 mg (04/13/2018) pertuzumab (PERJETA) 840 mg in sodium chloride 0.9 % 250 mL chemo infusion, 840 mg, Intravenous, Once, 1 of 14 cycles Administration: 840 mg (03/23/2018) fosaprepitant (EMEND) 150 mg, dexamethasone (DECADRON) 12 mg in sodium chloride 0.9 % 145 mL IVPB, , Intravenous,  Once, 2 of 6  cycles Administration:  (03/23/2018),  (04/13/2018)  for chemotherapy treatment.      CHIEF COMPLIANT: Cycle 3 TCH  INTERVAL HISTORY: Jacqueline Good is a 57 year old with above-mentioned history of HER-2 positive breast cancer who is currently neoadjuvant chemotherapy today cycle 3 of TCH.  He continues to have diarrhea in spite of stopping Perjeta.  Although the diarrhea is much better than before.  She goes to bathroom 3 times a day.  She has loose stools.  She has Imodium and Lomotil and she occasionally takes them but not as prescribed.  She also does not drink enough liquids because this she appears to be getting dehydrated.  Denies any significant nausea.  She does have mild nausea which is well controlled with nausea medications.  REVIEW OF SYSTEMS:   Constitutional: Denies fevers, chills or abnormal weight loss Eyes: Denies blurriness of vision Ears, nose, mouth, throat, and face: Denies mucositis or sore throat Respiratory: Denies cough, dyspnea or wheezes Cardiovascular: Denies palpitation, chest discomfort Gastrointestinal: Diarrhea nausea Skin: Denies abnormal skin rashes Lymphatics: Denies new lymphadenopathy or easy bruising Neurological:Denies numbness, tingling or new weaknesses Behavioral/Psych: Mood is stable, no new changes  Extremities: No lower extremity edema  All other systems were reviewed with the patient and are negative.  I have reviewed the past medical history, past surgical history, social history and family history with the patient and they are unchanged from previous note.  ALLERGIES:  is allergic to gadolinium derivatives.  MEDICATIONS:  Current Outpatient Medications  Medication Sig Dispense Refill  . albuterol (VENTOLIN HFA) 108 (90 Base) MCG/ACT inhaler INHALE 2 PUFFS INTO THE LUNGS EVERY 6 HOURS AS  NEEDED FOR WHEEZING 54 g 3  . ALPRAZolam (XANAX) 1 MG tablet Take 1 mg by mouth 3 (three) times daily as needed. for anxiety  2  . busPIRone (BUSPAR) 30  MG tablet Take 30 mg by mouth 2 (two) times daily.  2  . cetirizine-pseudoephedrine (ZYRTEC-D) 5-120 MG tablet Take 1 tablet by mouth daily. 15 tablet 0  . cholestyramine (QUESTRAN) 4 g packet Take 1 packet (4 g total) by mouth 2 (two) times daily. 60 each 12  . dexamethasone (DECADRON) 4 MG tablet Take 2 tablets (8 mg total) by mouth 2 (two) times daily. Start the day before Taxotere. Take once the day after, then 2 times a day x 2d. 30 tablet 1  . diphenoxylate-atropine (LOMOTIL) 2.5-0.025 MG tablet Take 1 tablet by mouth 4 (four) times daily as needed for diarrhea or loose stools. 30 tablet 0  . DULoxetine (CYMBALTA) 60 MG capsule TAKE 2 CAPSULES BY MOUTH DAILY IN THE MORNING.  5  . esomeprazole (NEXIUM) 40 MG capsule Take 1 capsule (40 mg total) by mouth daily. 30 capsule 5  . fluticasone (FLONASE) 50 MCG/ACT nasal spray Place 2 sprays into both nostrils daily. 1 g 0  . Fluticasone-Salmeterol (ADVAIR DISKUS) 250-50 MCG/DOSE AEPB INHALE 1 PUFF INTO THE LUNGS 2 TIMES DAILY. 60 each 2  . lidocaine-prilocaine (EMLA) cream Apply to affected area once 30 g 3  . LORazepam (ATIVAN) 0.5 MG tablet Take 1 tablet (0.5 mg total) by mouth at bedtime as needed (Nausea or vomiting). 30 tablet 0  . Melatonin 5 MG TABS Take 1 tablet by mouth at bedtime.    . naproxen (NAPROSYN) 500 MG tablet Take 1 tablet (500 mg total) by mouth 2 (two) times daily with a meal. 60 tablet 3  . ondansetron (ZOFRAN) 8 MG tablet Take 1 tablet (8 mg total) by mouth every 8 (eight) hours as needed for nausea or vomiting. Do not take the first three days after chemo 20 tablet 0  . prochlorperazine (COMPAZINE) 10 MG tablet Take 1 tablet (10 mg total) by mouth every 6 (six) hours as needed (Nausea or vomiting). 30 tablet 1  . venlafaxine (EFFEXOR) 75 MG tablet Take 75 mg by mouth 2 (two) times daily.    Marland Kitchen zolpidem (AMBIEN) 10 MG tablet Take 10 mg by mouth at bedtime as needed for sleep.     No current facility-administered medications  for this visit.     PHYSICAL EXAMINATION: ECOG PERFORMANCE STATUS: 1 - Symptomatic but completely ambulatory  Vitals:   05/04/18 1003  BP: 111/61  Pulse: 62  Resp: 20  Temp: 98.2 F (36.8 C)  SpO2: 98%   Filed Weights   05/04/18 1003  Weight: 204 lb 11.2 oz (92.9 kg)    GENERAL:alert, no distress and comfortable SKIN: skin color, texture, turgor are normal, no rashes or significant lesions EYES: normal, Conjunctiva are pink and non-injected, sclera clear OROPHARYNX:no exudate, no erythema and lips, buccal mucosa, and tongue normal  NECK: supple, thyroid normal size, non-tender, without nodularity LYMPH:  no palpable lymphadenopathy in the cervical, axillary or inguinal LUNGS: clear to auscultation and percussion with normal breathing effort HEART: regular rate & rhythm and no murmurs and no lower extremity edema ABDOMEN:abdomen soft, non-tender and normal bowel sounds MUSCULOSKELETAL:no cyanosis of digits and no clubbing  NEURO: alert & oriented x 3 with fluent speech, no focal motor/sensory deficits EXTREMITIES: No lower extremity edema   LABORATORY DATA:  I have reviewed the data as listed CMP Latest  Ref Rng & Units 05/04/2018 04/20/2018 04/13/2018  Glucose 70 - 99 mg/dL 94 83 90  BUN 6 - 20 mg/dL 21(H) 23(H) 14  Creatinine 0.44 - 1.00 mg/dL 0.87 1.34(H) 0.90  Sodium 135 - 145 mmol/L 141 139 141  Potassium 3.5 - 5.1 mmol/L 3.4(L) 3.5 4.0  Chloride 98 - 111 mmol/L 108 104 107  CO2 22 - 32 mmol/L 25 21(L) 26  Calcium 8.9 - 10.3 mg/dL 9.2 10.0 9.4  Total Protein 6.5 - 8.1 g/dL 7.0 8.1 6.5  Total Bilirubin 0.3 - 1.2 mg/dL 0.5 0.6 0.6  Alkaline Phos 38 - 126 U/L 105 143(H) 112  AST 15 - 41 U/L _0 ALT 0 - 44 U/L 25 72(H) 29    Lab Results  Component Value Date   WBC 7.5 05/04/2018   HGB 10.6 (L) 05/04/2018   HCT 31.7 (L) 05/04/2018   MCV 87.9 05/04/2018   PLT 370 05/04/2018   NEUTROABS 5.6 05/04/2018    ASSESSMENT & PLAN:  Malignant neoplasm of upper-outer  quadrant of right breast in female, estrogen receptor negative ([place]) 57 y.o. [place] woman status post right breast upper outer quadrant biopsy 02/25/2018 for a clinical T2 pN1, stage IIB invasive ductal carcinoma, estrogen and progesterone receptor negative, HER-2 amplified, with an MIB-1 of 50%.             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   (1) genetics testing pending  (2) neoadjuvant chemotherapy will consist of carboplatin, docetaxel, trastuzumab and Pertuzumab every 21 days x 6 starting 03/23/2018             (a) pertuzumab omitted from cycle 2  (3) anti-HER-2 treatment will continue for a minimum of 6 months             (a) baseline echocardiogram 03/16/2018 shows an ejection fraction in the 55-60% range.  (4) definitive surgery with targeted axillary dissection to follow  (5) adjuvant radiation  (6) left adrenal adenoma measuring 2.9 cm on CT of the chest 03/16/2018  PLAN: Cycle 3 TCH Perjeta has been discontinued after first cycle because of profound diarrhea.:  For the diarrhea she takes Imodium and Lomotil.  However she has not been taking it as prescribed. Because of diarrhea she has also become dehydrated. I encouraged her to drink more fluids. She also complained of mild nausea.  She is taking antinausea medications are helping. Monitoring closely for chemo toxicities.  Return to clinic in 3 weeks for cycle 4 and follow-up with Dr. Jana Hakim.   No orders of the defined types were placed in this encounter.  The patient has a good understanding of the overall plan. she agrees with it. she will call with any problems that may develop before the next visit here.   Harriette Ohara, MD 05/04/18

## 2018-05-06 ENCOUNTER — Inpatient Hospital Stay: Payer: Medicaid Other

## 2018-05-06 VITALS — BP 110/74 | HR 90 | Temp 97.7°F | Resp 18

## 2018-05-06 DIAGNOSIS — Z171 Estrogen receptor negative status [ER-]: Secondary | ICD-10-CM

## 2018-05-06 DIAGNOSIS — Z5111 Encounter for antineoplastic chemotherapy: Secondary | ICD-10-CM | POA: Diagnosis not present

## 2018-05-06 DIAGNOSIS — C50411 Malignant neoplasm of upper-outer quadrant of right female breast: Secondary | ICD-10-CM

## 2018-05-06 MED ORDER — PEGFILGRASTIM INJECTION 6 MG/0.6ML ~~LOC~~
6.0000 mg | PREFILLED_SYRINGE | Freq: Once | SUBCUTANEOUS | Status: AC
  Administered 2018-05-06: 6 mg via SUBCUTANEOUS

## 2018-05-25 ENCOUNTER — Other Ambulatory Visit: Payer: No Typology Code available for payment source

## 2018-05-25 ENCOUNTER — Ambulatory Visit: Payer: No Typology Code available for payment source | Admitting: Oncology

## 2018-05-25 ENCOUNTER — Ambulatory Visit: Payer: No Typology Code available for payment source

## 2018-05-27 ENCOUNTER — Telehealth: Payer: Self-pay | Admitting: Oncology

## 2018-05-27 NOTE — Telephone Encounter (Signed)
Appt scheduled patient notified per 10/17 sch msg °

## 2018-05-31 ENCOUNTER — Telehealth: Payer: Self-pay | Admitting: *Deleted

## 2018-05-31 ENCOUNTER — Inpatient Hospital Stay: Payer: Medicaid Other

## 2018-05-31 ENCOUNTER — Inpatient Hospital Stay: Payer: Medicaid Other | Attending: Oncology

## 2018-05-31 ENCOUNTER — Telehealth: Payer: Self-pay | Admitting: Adult Health

## 2018-05-31 ENCOUNTER — Encounter: Payer: Self-pay | Admitting: Adult Health

## 2018-05-31 ENCOUNTER — Inpatient Hospital Stay (HOSPITAL_BASED_OUTPATIENT_CLINIC_OR_DEPARTMENT_OTHER): Payer: Medicaid Other | Admitting: Adult Health

## 2018-05-31 ENCOUNTER — Other Ambulatory Visit: Payer: Self-pay | Admitting: *Deleted

## 2018-05-31 VITALS — BP 134/99 | HR 83 | Temp 98.1°F | Resp 17 | Ht 64.0 in | Wt 204.1 lb

## 2018-05-31 DIAGNOSIS — Z171 Estrogen receptor negative status [ER-]: Secondary | ICD-10-CM

## 2018-05-31 DIAGNOSIS — R197 Diarrhea, unspecified: Secondary | ICD-10-CM | POA: Diagnosis not present

## 2018-05-31 DIAGNOSIS — R110 Nausea: Secondary | ICD-10-CM | POA: Diagnosis not present

## 2018-05-31 DIAGNOSIS — J441 Chronic obstructive pulmonary disease with (acute) exacerbation: Secondary | ICD-10-CM

## 2018-05-31 DIAGNOSIS — C50411 Malignant neoplasm of upper-outer quadrant of right female breast: Secondary | ICD-10-CM

## 2018-05-31 DIAGNOSIS — Z5189 Encounter for other specified aftercare: Secondary | ICD-10-CM | POA: Diagnosis not present

## 2018-05-31 DIAGNOSIS — G629 Polyneuropathy, unspecified: Secondary | ICD-10-CM

## 2018-05-31 DIAGNOSIS — Z5111 Encounter for antineoplastic chemotherapy: Secondary | ICD-10-CM | POA: Insufficient documentation

## 2018-05-31 DIAGNOSIS — Z5112 Encounter for antineoplastic immunotherapy: Secondary | ICD-10-CM | POA: Insufficient documentation

## 2018-05-31 DIAGNOSIS — J45901 Unspecified asthma with (acute) exacerbation: Secondary | ICD-10-CM

## 2018-05-31 DIAGNOSIS — F17210 Nicotine dependence, cigarettes, uncomplicated: Secondary | ICD-10-CM

## 2018-05-31 DIAGNOSIS — Z95828 Presence of other vascular implants and grafts: Secondary | ICD-10-CM

## 2018-05-31 LAB — CBC WITH DIFFERENTIAL/PLATELET
Abs Immature Granulocytes: 0.01 10*3/uL (ref 0.00–0.07)
BASOS ABS: 0 10*3/uL (ref 0.0–0.1)
Basophils Relative: 0 %
Eosinophils Absolute: 0.1 10*3/uL (ref 0.0–0.5)
Eosinophils Relative: 1 %
HCT: 28.5 % — ABNORMAL LOW (ref 36.0–46.0)
Hemoglobin: 9.1 g/dL — ABNORMAL LOW (ref 12.0–15.0)
IMMATURE GRANULOCYTES: 0 %
LYMPHS ABS: 1.2 10*3/uL (ref 0.7–4.0)
Lymphocytes Relative: 21 %
MCH: 30.1 pg (ref 26.0–34.0)
MCHC: 31.9 g/dL (ref 30.0–36.0)
MCV: 94.4 fL (ref 80.0–100.0)
Monocytes Absolute: 0.5 10*3/uL (ref 0.1–1.0)
Monocytes Relative: 8 %
NEUTROS ABS: 4.2 10*3/uL (ref 1.7–7.7)
NEUTROS PCT: 70 %
NRBC: 0 % (ref 0.0–0.2)
Platelets: 331 10*3/uL (ref 150–400)
RBC: 3.02 MIL/uL — AB (ref 3.87–5.11)
RDW: 18.6 % — ABNORMAL HIGH (ref 11.5–15.5)
WBC: 6 10*3/uL (ref 4.0–10.5)

## 2018-05-31 LAB — COMPREHENSIVE METABOLIC PANEL
ALT: 31 U/L (ref 0–44)
ANION GAP: 10 (ref 5–15)
AST: 22 U/L (ref 15–41)
Albumin: 3.8 g/dL (ref 3.5–5.0)
Alkaline Phosphatase: 87 U/L (ref 38–126)
BUN: 18 mg/dL (ref 6–20)
CHLORIDE: 111 mmol/L (ref 98–111)
CO2: 21 mmol/L — AB (ref 22–32)
Calcium: 9.2 mg/dL (ref 8.9–10.3)
Creatinine, Ser: 0.79 mg/dL (ref 0.44–1.00)
GFR calc Af Amer: >60 mL/min (ref >60)
GFR calc non Af Amer: >60 mL/min (ref >60)
Glucose, Bld: 97 mg/dL (ref 70–99)
POTASSIUM: 3.6 mmol/L (ref 3.5–5.1)
SODIUM: 142 mmol/L (ref 135–145)
Total Bilirubin: 0.6 mg/dL (ref 0.3–1.2)
Total Protein: 6.8 g/dL (ref 6.5–8.1)

## 2018-05-31 MED ORDER — SODIUM CHLORIDE 0.9 % IV SOLN
Freq: Once | INTRAVENOUS | Status: AC
  Administered 2018-05-31: 10:00:00 via INTRAVENOUS
  Filled 2018-05-31: qty 5

## 2018-05-31 MED ORDER — SODIUM CHLORIDE 0.9 % IV SOLN
713.5000 mg | Freq: Once | INTRAVENOUS | Status: AC
  Administered 2018-05-31: 710 mg via INTRAVENOUS
  Filled 2018-05-31: qty 71

## 2018-05-31 MED ORDER — DIPHENHYDRAMINE HCL 25 MG PO CAPS
ORAL_CAPSULE | ORAL | Status: AC
  Filled 2018-05-31: qty 1

## 2018-05-31 MED ORDER — HEPARIN SOD (PORK) LOCK FLUSH 100 UNIT/ML IV SOLN
500.0000 [IU] | Freq: Once | INTRAVENOUS | Status: AC | PRN
  Administered 2018-05-31: 500 [IU]
  Filled 2018-05-31: qty 5

## 2018-05-31 MED ORDER — DIPHENHYDRAMINE HCL 25 MG PO CAPS
25.0000 mg | ORAL_CAPSULE | Freq: Once | ORAL | Status: AC
  Administered 2018-05-31: 25 mg via ORAL

## 2018-05-31 MED ORDER — SODIUM CHLORIDE 0.9 % IV SOLN
Freq: Once | INTRAVENOUS | Status: AC
  Administered 2018-05-31: 10:00:00 via INTRAVENOUS
  Filled 2018-05-31: qty 250

## 2018-05-31 MED ORDER — PALONOSETRON HCL INJECTION 0.25 MG/5ML
0.2500 mg | Freq: Once | INTRAVENOUS | Status: AC
  Administered 2018-05-31: 0.25 mg via INTRAVENOUS

## 2018-05-31 MED ORDER — SODIUM CHLORIDE 0.9% FLUSH
10.0000 mL | INTRAVENOUS | Status: DC | PRN
  Administered 2018-05-31: 10 mL via INTRAVENOUS
  Filled 2018-05-31: qty 10

## 2018-05-31 MED ORDER — SODIUM CHLORIDE 0.9 % IV SOLN
800.0000 mg/m2 | Freq: Once | INTRAVENOUS | Status: AC
  Administered 2018-05-31: 1672 mg via INTRAVENOUS
  Filled 2018-05-31: qty 43.97

## 2018-05-31 MED ORDER — OMEPRAZOLE 40 MG PO CPDR: 40.0000 mg | DELAYED_RELEASE_CAPSULE | Freq: Every day | ORAL | 2 refills | Status: DC

## 2018-05-31 MED ORDER — ACETAMINOPHEN 325 MG PO TABS
ORAL_TABLET | ORAL | Status: AC
  Filled 2018-05-31: qty 2

## 2018-05-31 MED ORDER — ACETAMINOPHEN 325 MG PO TABS
650.0000 mg | ORAL_TABLET | Freq: Once | ORAL | Status: AC
  Administered 2018-05-31: 650 mg via ORAL

## 2018-05-31 MED ORDER — SODIUM CHLORIDE 0.9% FLUSH
10.0000 mL | INTRAVENOUS | Status: DC | PRN
  Administered 2018-05-31: 10 mL
  Filled 2018-05-31: qty 10

## 2018-05-31 MED ORDER — PALONOSETRON HCL INJECTION 0.25 MG/5ML
INTRAVENOUS | Status: AC
  Filled 2018-05-31: qty 5

## 2018-05-31 MED ORDER — TRASTUZUMAB CHEMO 150 MG IV SOLR
600.0000 mg | Freq: Once | INTRAVENOUS | Status: AC
  Administered 2018-05-31: 600 mg via INTRAVENOUS
  Filled 2018-05-31: qty 28.57

## 2018-05-31 MED FILL — OMEPRAZOLE 40 MG CPDR: 40 | 30 days supply | Qty: 30 | Fill #0

## 2018-05-31 NOTE — Telephone Encounter (Signed)
Referral placed for pt to see Dr Marla Roe and appt received. Called pt twice with appt information. Unable to leave msg.verified email swent with appt date and information

## 2018-05-31 NOTE — Patient Instructions (Signed)
[facility] Discharge Instructions for Patients Receiving Chemotherapy  Today you received the following chemotherapy agents Herceptin, Gemzar and Carboplatin   To help prevent nausea and vomiting after your treatment, we encourage you to take your nausea medication as directed.    If you develop nausea and vomiting that is not controlled by your nausea medication, call the clinic.   BELOW ARE SYMPTOMS THAT SHOULD BE REPORTED IMMEDIATELY:  *FEVER GREATER THAN 100.5 F  *CHILLS WITH OR WITHOUT FEVER  NAUSEA AND VOMITING THAT IS NOT CONTROLLED WITH YOUR NAUSEA MEDICATION  *UNUSUAL SHORTNESS OF BREATH  *UNUSUAL BRUISING OR BLEEDING  TENDERNESS IN MOUTH AND THROAT WITH OR WITHOUT PRESENCE OF ULCERS  *URINARY PROBLEMS  *BOWEL PROBLEMS  UNUSUAL RASH Items with * indicate a potential emergency and should be followed up as soon as possible.  Feel free to call the clinic should you have any questions or concerns. The clinic phone number is [phone].  Please show the [place] at check-in to the Emergency Department and triage nurse.  Gemcitabine injection What is this medicine? GEMCITABINE (jem SIT a been) is a chemotherapy drug. This medicine is used to treat many types of cancer like breast cancer, lung cancer, pancreatic cancer, and ovarian cancer. This medicine may be used for other purposes; ask your health care provider or pharmacist if you have questions. COMMON BRAND NAME(S): Gemzar What should I tell my health care provider before I take this medicine? They need to know if you have any of these conditions: -blood disorders -infection -kidney disease -liver disease -recent or ongoing radiation therapy -an unusual or allergic reaction to gemcitabine, other chemotherapy, other medicines, foods, dyes, or preservatives -pregnant or trying to get pregnant -breast-feeding How should I use this medicine? This drug is given as an infusion into a  vein. It is administered in a hospital or clinic by a specially trained health care professional. Talk to your pediatrician regarding the use of this medicine in children. Special care may be needed. Overdosage: If you think you have taken too much of this medicine contact a poison control center or emergency room at once. NOTE: This medicine is only for you. Do not share this medicine with others. What if I miss a dose? It is important not to miss your dose. Call your doctor or health care professional if you are unable to keep an appointment. What may interact with this medicine? -medicines to increase blood counts like filgrastim, pegfilgrastim, sargramostim -some other chemotherapy drugs like cisplatin -vaccines Talk to your doctor or health care professional before taking any of these medicines: -acetaminophen -aspirin -ibuprofen -ketoprofen -naproxen This list may not describe all possible interactions. Give your health care provider a list of all the medicines, herbs, non-prescription drugs, or dietary supplements you use. Also tell them if you smoke, drink alcohol, or use illegal drugs. Some items may interact with your medicine. What should I watch for while using this medicine? Visit your doctor for checks on your progress. This drug may make you feel generally unwell. This is not uncommon, as chemotherapy can affect healthy cells as well as cancer cells. Report any side effects. Continue your course of treatment even though you feel ill unless your doctor tells you to stop. In some cases, you may be given additional medicines to help with side effects. Follow all directions for their use. Call your doctor or health care professional for advice if you get a fever, chills or sore throat, or other  symptoms of a cold or flu. Do not treat yourself. This drug decreases your body's ability to fight infections. Try to avoid being around people who are sick. This medicine may increase your  risk to bruise or bleed. Call your doctor or health care professional if you notice any unusual bleeding. Be careful brushing and flossing your teeth or using a toothpick because you may get an infection or bleed more easily. If you have any dental work done, tell your dentist you are receiving this medicine. Avoid taking products that contain aspirin, acetaminophen, ibuprofen, naproxen, or ketoprofen unless instructed by your doctor. These medicines may hide a fever. Women should inform their doctor if they wish to become pregnant or think they might be pregnant. There is a potential for serious side effects to an unborn child. Talk to your health care professional or pharmacist for more information. Do not breast-feed an infant while taking this medicine. What side effects may I notice from receiving this medicine? Side effects that you should report to your doctor or health care professional as soon as possible: -allergic reactions like skin rash, itching or hives, swelling of the face, lips, or tongue -low blood counts - this medicine may decrease the number of white blood cells, red blood cells and platelets. You may be at increased risk for infections and bleeding. -signs of infection - fever or chills, cough, sore throat, pain or difficulty passing urine -signs of decreased platelets or bleeding - bruising, pinpoint red spots on the skin, black, tarry stools, blood in the urine -signs of decreased red blood cells - unusually weak or tired, fainting spells, lightheadedness -breathing problems -chest pain -mouth sores -nausea and vomiting -pain, swelling, redness at site where injected -pain, tingling, numbness in the hands or feet -stomach pain -swelling of ankles, feet, hands -unusual bleeding Side effects that usually do not require medical attention (report to your doctor or health care professional if they continue or are bothersome): -constipation -diarrhea -hair loss -loss of  appetite -stomach upset This list may not describe all possible side effects. Call your doctor for medical advice about side effects. You may report side effects to FDA at 1-800-FDA-1088. Where should I keep my medicine? This drug is given in a hospital or clinic and will not be stored at home. NOTE: This sheet is a summary. It may not cover all possible information. If you have questions about this medicine, talk to your doctor, pharmacist, or health care provider.  2018 [employer] (2007-12-07 18:45:54)

## 2018-05-31 NOTE — Telephone Encounter (Signed)
Per 10/21 los.  LC advised okay to overbook.  Spoke with infusion.  They will print schedule for patient.

## 2018-05-31 NOTE — Patient Instructions (Signed)

## 2018-05-31 NOTE — Progress Notes (Addendum)
[facility]  [phone] [phone]     ID: MONTE BRONDER DOB: 06-30-1961  MR#: [id number]  VFI#:[id number]  Patient Care Team: [name], MD as PCP - General (Internal Medicine) Alphonsa Overall, MD as Consulting Physician (General Surgery) [name], [name], MD as Consulting Physician (Oncology) Eppie Gibson, MD as Attending Physician (Radiation Oncology) OTHER MD: Donnal Moat, PA-C [FAX [phone]]  CHIEF COMPLAINT: Estrogen receptor negative breast cancer  CURRENT TREATMENT: Neoadjuvant chemoimmunotherapy  INTERVAL HISTORY: Jacqueline Good returns today for follow up of her estrogen receptor negative but HER-2 positive breast cancer accompanied by her mother and brother.  The patient is receiving neoadjuvant chemoimmunotherapy with carboplatin, docetaxel, trastuzumab, and pertuzumab every 21 days with Neulasta support on day 3. Today is day 1 cycle 4.  Pertuzumab omitted this cycle due to diarrhea.   REVIEW OF SYSTEMS: Jacqueline Good is doing moderately well today.  She notes nausea.  She says that Omeprazole helps with her nauseated feeling.  She notes that Dexamethasone makes it worse.  She says that when she does get nauseated, no medications seem to help her nausea.    She continues to have diarrhea.  She is having about three per day.  She says a couple of days she hasn't had diarrhea, and notes that this is starting to ease up.  She has increased her water intake by drinking seltzer water.  She is taking Questran twice a day.  She takes lomotil and alternates this with imodium.  She denies abdominal pain, fevers, chills.    She does have some mild numbness in the fingertips.  She notes that this is intermittent and started approximately after cycle 2 of her treatment.  No motor deficits noted.    Jacqueline Good notes that she has a hard time with the Neulasta.  She notes that she has a lot of uncomfortable flu like symptoms after receiving the shot,  and says she feels very unwell after receiving it.    Jacqueline Good is without headaches or vision changes.  She denies any other issues, other than what is noted above and a detailed ROS is otherwise non contributory.    Jacqueline Good is requesting to meet with a different plastic surgeon.  She met with Dr. Iran Planas and would like another opinion.   HISTORY OF CURRENT ILLNESS: From the original intake note:  Jacqueline Good had routine screening mammography on 02/05/2018 showing a possible abnormality in the right breast. She underwent unilateral right diagnostic mammography with tomography and right breast ultrasonography at [facility] on 02/15/2018 showing: Highly suspicious right breast mass at 10 o'clock position.  There was a second, 0.6 cm lesion at the 10:00 radiant which has not been biopsied.  Suspicious focal cortical thickening of a single right axillary lymph node.  Accordingly on 02/25/2018 she proceeded to biopsy of the right breast area in question. The pathology from this procedure showed ([id number]): Breast, right, needle core biopsy, OUQ with microscopic focus of invasive ductal carcinoma, grade 2, arising in a background of high grade ductal carcinoma in situ. 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   The patient's subsequent history is as detailed below.    PAST MEDICAL HISTORY: Past Medical History:  Diagnosis Date  . Arthritis   . Asthma   . breast ca dx'd 02/2018   right  . Depression     PAST SURGICAL HISTORY: Past Surgical History:  Procedure Laterality Date  .  endometrial ablation    . IR IMAGING GUIDED PORT INSERTION  03/18/2018  . MYOMECTOMY      FAMILY HISTORY Family History  Problem Relation Age of Onset  . Lupus Sister   . Heart disease Sister   . Breast cancer Mother    She notes that her father is currently 23 years old. Patients' mother is currently 24 years old and she was diagnosed  with breast cancer at age 22. The patient has 4 brothers and 1 sister. She has a maternal aunt with breast cancer and her maternal great grandmother with had ovarian cancer.    GYNECOLOGIC HISTORY:  No LMP recorded. Patient is postmenopausal. Menarche: 57 years old Age at first live birth: 57 years old Jacqueline Good P1 LMP: 13-14 years ago s/p ablation Contraceptive: no HRT: no  Hysterectomy: no SO: no  SOCIAL HISTORY: She is currently unemployed.  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 She lives alone without pets. Her daughter is Jacqueline Good who lives in [place] and works in Therapist, art.  The patient has one grandchild. She goes to a [employer].       ADVANCED DIRECTIVES: Not in place.  At the 03/03/2018 visit the patient was given the appropriate documents to complete and notarized at her discretion   HEALTH MAINTENANCE: Social History   Tobacco Use  . Smoking status: Current Every Day Smoker    Packs/day: 1.00    Years: 20.00    Pack years: 20.00    Types: Cigarettes  . Smokeless tobacco: Never Used  Substance Use Topics  . Alcohol use: Yes    Comment: occasionally  . Drug use: No     Colonoscopy: Never  PAP:   Bone density: Never   Allergies  Allergen Reactions  . Gadolinium Derivatives Itching and Cough    Pt began sneezing and coughing as soon as MRI contrast was injected. Severe nasal congestion. No rash or hives no SOB.     Current Outpatient Medications  Medication Sig Dispense Refill  . albuterol (VENTOLIN HFA) 108 (90 Base) MCG/ACT inhaler INHALE 2 PUFFS INTO THE LUNGS EVERY 6 HOURS AS NEEDED FOR WHEEZING 54 g 3  . ALPRAZolam (XANAX) 1 MG tablet Take 1 mg by mouth 3 (three) times daily as needed. for anxiety  2  . busPIRone (BUSPAR) 30 MG tablet Take 30 mg by mouth 2 (two) times daily.  2  . cetirizine-pseudoephedrine (ZYRTEC-D) 5-120 MG tablet Take 1 tablet by mouth daily. 15 tablet 0  . cholestyramine (QUESTRAN) 4 g  packet Take 1 packet (4 g total) by mouth 2 (two) times daily. 60 each 12  . DULoxetine (CYMBALTA) 60 MG capsule TAKE 2 CAPSULES BY MOUTH DAILY IN THE MORNING.  5  . fluticasone (FLONASE) 50 MCG/ACT nasal spray Place 2 sprays into both nostrils daily. 1 g 0  . Fluticasone-Salmeterol (ADVAIR DISKUS) 250-50 MCG/DOSE AEPB INHALE 1 PUFF INTO THE LUNGS 2 TIMES DAILY. 60 each 2  . lidocaine-prilocaine (EMLA) cream Apply to affected area once 30 g 3  . loperamide (IMODIUM) 2 MG capsule Take 2 mg by mouth as needed for diarrhea or loose stools.    . naproxen (NAPROSYN) 500 MG tablet Take 1 tablet (500 mg total) by mouth 2 (two) times daily with a meal. 60 tablet 3  . ondansetron (ZOFRAN) 8 MG tablet Take 1 tablet (8 mg total) by mouth every 8 (eight) hours as needed for nausea or vomiting. Do not take the first three days after  chemo 20 tablet 0  . venlafaxine (EFFEXOR) 75 MG tablet Take 75 mg by mouth 2 (two) times daily.    Marland Kitchen zolpidem (AMBIEN) 10 MG tablet Take 10 mg by mouth at bedtime as needed for sleep.    Marland Kitchen dexamethasone (DECADRON) 4 MG tablet Take 2 tablets (8 mg total) by mouth 2 (two) times daily. Start the day before Taxotere. Take once the day after, then 2 times a day x 2d. 30 tablet 1  . diphenoxylate-atropine (LOMOTIL) 2.5-0.025 MG tablet Take 1 tablet by mouth 4 (four) times daily as needed for diarrhea or loose stools. 30 tablet 0  . LORazepam (ATIVAN) 0.5 MG tablet Take 1 tablet (0.5 mg total) by mouth at bedtime as needed (Nausea or vomiting). 30 tablet 0  . omeprazole (PRILOSEC) 40 MG capsule Take 1 capsule (40 mg total) by mouth daily. 30 capsule 2  . prochlorperazine (COMPAZINE) 10 MG tablet Take 1 tablet (10 mg total) by mouth every 6 (six) hours as needed (Nausea or vomiting). 30 tablet 1   No current facility-administered medications for this visit.     OBJECTIVE:  Vitals:   05/31/18 0825  BP: (!) 134/99  Pulse: 83  Resp: 17  Temp: 98.1 F (36.7 C)  SpO2: 98%     Body  mass index is 35.03 kg/m.   Wt Readings from Last 3 Encounters:  05/31/18 204 lb 1.6 oz (92.6 kg)  05/04/18 204 lb 11.2 oz (92.9 kg)  04/22/18 201 lb 6.4 oz (91.4 kg)  ECOG FS:1 - Symptomatic but completely ambulatory GENERAL: Patient is a well appearing female in no acute distress HEENT:  Sclerae anicteric.  Oropharynx clear and moist. No ulcerations or evidence of oropharyngeal candidiasis. Neck is supple.  NODES:  No cervical, supraclavicular, or axillary lymphadenopathy palpated.  BREAST EXAM:  Palpable abn in RUOQ small, about 0.5-1cm LUNGS:  Clear to auscultation bilaterally.  No wheezes or rhonchi. HEART:  Regular rate and rhythm. No murmur appreciated. ABDOMEN:  Soft, nontender.  Positive, normoactive bowel sounds. No organomegaly palpated. MSK:  No focal spinal tenderness to palpation. Full range of motion bilaterally in the upper extremities. EXTREMITIES:  No peripheral edema.   SKIN:  Clear with no obvious rashes or skin changes. No nail dyscrasia. NEURO:  Nonfocal. Well oriented.  Appropriate affect.    LAB RESULTS:  CMP     Component Value Date/Time   NA 142 05/31/2018 0759   K 3.6 05/31/2018 0759   CL 111 05/31/2018 0759   CO2 21 (L) 05/31/2018 0759   GLUCOSE 97 05/31/2018 0759   BUN 18 05/31/2018 0759   CREATININE 0.79 05/31/2018 0759   CREATININE 0.80 03/03/2018 0830   CREATININE 0.83 09/24/2016 1151   CALCIUM 9.2 05/31/2018 0759   PROT 6.8 05/31/2018 0759   ALBUMIN 3.8 05/31/2018 0759   AST 22 05/31/2018 0759   AST 15 03/03/2018 0830   ALT 31 05/31/2018 0759   ALT 16 03/03/2018 0830   ALKPHOS 87 05/31/2018 0759   BILITOT 0.6 05/31/2018 0759   BILITOT 0.5 03/03/2018 0830   GFRNONAA >60 05/31/2018 0759   GFRNONAA >60 03/03/2018 0830   GFRNONAA 79 09/24/2016 1151   GFRAA >60 05/31/2018 0759   GFRAA >60 03/03/2018 0830   GFRAA >89 09/24/2016 1151    No results found for: TOTALPROTELP, ALBUMINELP, A1GS, A2GS, BETS, BETA2SER, GAMS, MSPIKE, SPEI  No  results found for: KPAFRELGTCHN, LAMBDASER, KAPLAMBRATIO  Lab Results  Component Value Date   WBC 6.0 05/31/2018  NEUTROABS 4.2 05/31/2018   HGB 9.1 (L) 05/31/2018   HCT 28.5 (L) 05/31/2018   MCV 94.4 05/31/2018   PLT 331 05/31/2018    @LASTCHEMISTRY @  No results found for: LABCA2  No components found for: [id number]  No results for input(s): INR in the last 168 hours.  No results found for: LABCA2  No results found for: VOJ500  No results found for: XFG182  No results found for: XHB716  No results found for: CA2729  No components found for: HGQUANT  No results found for: CEA1 / No results found for: CEA1   No results found for: AFPTUMOR  No results found for: [place]  No results found for: PSA1  Appointment on 05/31/2018  Component Date Value Ref Range Status  . Sodium 05/31/2018 142  135 - 145 mmol/L Final  . Potassium 05/31/2018 3.6  3.5 - 5.1 mmol/L Final  . Chloride 05/31/2018 111  98 - 111 mmol/L Final  . CO2 05/31/2018 21* 22 - 32 mmol/L Final  . Glucose, Bld 05/31/2018 97  70 - 99 mg/dL Final  . BUN 05/31/2018 18  6 - 20 mg/dL Final  . Creatinine, Ser 05/31/2018 0.79  0.44 - 1.00 mg/dL Final  . Calcium 05/31/2018 9.2  8.9 - 10.3 mg/dL Final  . Total Protein 05/31/2018 6.8  6.5 - 8.1 g/dL Final  . Albumin 05/31/2018 3.8  3.5 - 5.0 g/dL Final  . AST 05/31/2018 22  15 - 41 U/L Final  . ALT 05/31/2018 31  0 - 44 U/L Final  . Alkaline Phosphatase 05/31/2018 87  38 - 126 U/L Final  . Total Bilirubin 05/31/2018 0.6  0.3 - 1.2 mg/dL Final  . GFR calc non Af Amer 05/31/2018 >60  >60 mL/min Final  . GFR calc Af Amer 05/31/2018 >60  >60 mL/min Final   Comment: (NOTE) The eGFR has been calculated using the CKD EPI equation. This calculation has not been validated in all clinical situations. eGFR's persistently <60 mL/min signify possible Chronic Kidney Disease.   Jacqueline Good gap 05/31/2018 10  5 - 15 Final   Performed at [facility], [place] [address] [place], [place] [address]  . WBC 05/31/2018 6.0  4.0 - 10.5 K/uL Final  . RBC 05/31/2018 3.02* 3.87 - 5.11 MIL/uL Final  . Hemoglobin 05/31/2018 9.1* 12.0 - 15.0 g/dL Final  . HCT 05/31/2018 28.5* 36.0 - 46.0 % Final  . MCV 05/31/2018 94.4  80.0 - 100.0 fL Final  . MCH 05/31/2018 30.1  26.0 - 34.0 pg Final  . MCHC 05/31/2018 31.9  30.0 - 36.0 g/dL Final  . RDW 05/31/2018 18.6* 11.5 - 15.5 % Final  . Platelets 05/31/2018 331  150 - 400 K/uL Final  . nRBC 05/31/2018 0.0  0.0 - 0.2 % Final  . Neutrophils Relative % 05/31/2018 70  % Final  . Neutro Abs 05/31/2018 4.2  1.7 - 7.7 K/uL Final  . Lymphocytes Relative 05/31/2018 21  % Final  . Lymphs Abs 05/31/2018 1.2  0.7 - 4.0 K/uL Final  . Monocytes Relative 05/31/2018 8  % Final  . Monocytes Absolute 05/31/2018 0.5  0.1 - 1.0 K/uL Final  . Eosinophils Relative 05/31/2018 1  % Final  . Eosinophils Absolute 05/31/2018 0.1  0.0 - 0.5 K/uL Final  . Basophils Relative 05/31/2018 0  % Final  . Basophils Absolute 05/31/2018 0.0  0.0 - 0.1 K/uL Final  . Immature Granulocytes 05/31/2018 0  % Final  . Abs Immature  Granulocytes 05/31/2018 0.01  0.00 - 0.07 K/uL Final   Performed at [facility], [address] [place], [place] [address]    (this displays the last labs from the last 3 days)  No results found for: TOTALPROTELP, ALBUMINELP, A1GS, A2GS, BETS, BETA2SER, GAMS, MSPIKE, SPEI (this displays SPEP labs)  No results found for: KPAFRELGTCHN, LAMBDASER, KAPLAMBRATIO (kappa/lambda light chains)  No results found for: HGBA, HGBA2QUANT, HGBFQUANT, HGBSQUAN (Hemoglobinopathy evaluation)   No results found for: LDH  No results found for: IRON, TIBC, IRONPCTSAT (Iron and TIBC)  No results found for: FERRITIN  Urinalysis    Component Value Date/Time   COLORURINE CANCELED 09/24/2016 1151   APPEARANCEUR CANCELED 09/24/2016 1151   LABSPEC CANCELED 09/24/2016 1151   PHURINE CANCELED  09/24/2016 1151   GLUCOSEU CANCELED 09/24/2016 1151   HGBUR CANCELED 09/24/2016 1151   BILIRUBINUR CANCELED 09/24/2016 1151   KETONESUR CANCELED 09/24/2016 1151   PROTEINUR CANCELED 09/24/2016 1151   NITRITE CANCELED 09/24/2016 1151   LEUKOCYTESUR CANCELED 09/24/2016 1151     STUDIES:  No results found.  ELIGIBLE FOR AVAILABLE RESEARCH PROTOCOL:BCEP  ASSESSMENT: 57 y.o. [employer] woman status post right breast upper outer quadrant biopsy 02/25/2018 for a clinical T2 pN1, stage IIB invasive ductal carcinoma, estrogen and progesterone receptor negative, HER-2 amplified, with an MIB-1 of 50%.  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  (4) definitive surgery with targeted axillary dissection to follow  (5) adjuvant radiation  (6) left adrenal adenoma measuring 2.9 cm on CT of the chest 03/16/2018  PLAN:  Jacqueline Good is doing moderately well.  She is having some issues with nausea, and the Omperazole is helping.  I prescribed prescription Omeprazole and counseled her to take it first thing in the morning.    We reviewed the concern of her diarrhea.  She and I talked about this and to take anti diarrheal medication with the first liquid bowel movement.  She has Questran and Lomotil prescribed, and also has Imodium she uses OTC.    Jacqueline Good also unfortunately has early peripheral neuropathy.  I reviewed this with Dr. Jana Hakim and he came into see the patient with me.  The  neuropathy is likely due to the Docetaxel and she will not receive this any more.  Instead, she will receive Gemcitabine.  I reviewed this change with Jacqueline Good in pharmacy and Jacqueline Good in nursing.  The adverse effects and information were reviewed with patient by Dr. Jana Hakim in detail.  I also gave her detailed information in her AVS about it.  Kirk will also receive Granix 470mg x 4 days following treatment instead of the Neulasta.  We will see if that will alleviate some of the flu like symptoms she experiences with the Neulasta.    I requested KVarney Bilesget Cagney in with Dr. DMarla Roeto review her plastic surgery options.  I reviewed this with Jaquetta, and if she doesn't receive notification of an appointment within the week, she will let uKoreaknow.  CXeewill return in one week for labs and  f/u.  I requested her granix appointment accordingly.  She knows to call for any concerns that may arise before then, and I highlighted for her to call if she continues to have diarrhea.    A total of (30) minutes of face-to-face time was spent with this patient with greater than 50% of that time in counseling and care-coordination.   Jacqueline Bihari, NP  05/31/18 9:22 AM Medical Oncology and Hematology [facility] [address] [place], [place] [address] Tel. [phone]    Fax. [phone]   ADDENDUM: It has been difficult to get Rainelle through her treatments but but she is now halfway through and we do see some evidence of response although not yet a complete clinical response.  We had to discontinue the Pertuzumab after cycle 1 because of diarrhea.  She continues to have diarrhea however.  She is not taking Imodium as prescribed or the Questran as prescribed.  We discussed that in detail today.  I believe if she takes Imodium after her first diarrheal bowel movement that might be all she needs to do to control this problem.  Otherwise she can take Questran with the next diarrheal  bowel movement.  Unfortunately she is beginning to develop neuropathy.  Luckily this is intermittent but minimal.  It is not affecting her quality of life.  We are going to stop the docetaxel and substitute gemcitabine.  This was discussed extensively with her today.  I am hopeful with these changes she will be able to complete her 6 treatments and get the optimal benefit from her therapy.  She knows to call for any other issues that may develop before the next visit.   I personally saw this patient and performed a substantive portion of this encounter with the listed APP documented above.   Chauncey Cruel, MD Medical Oncology and Hematology [facility] [address] [place], [place] [address] Tel. [phone]    Fax. [phone]

## 2018-05-31 NOTE — Progress Notes (Signed)
Error

## 2018-06-02 ENCOUNTER — Inpatient Hospital Stay: Payer: Medicaid Other

## 2018-06-02 VITALS — BP 111/77 | HR 67 | Temp 98.5°F | Resp 18

## 2018-06-02 DIAGNOSIS — C50411 Malignant neoplasm of upper-outer quadrant of right female breast: Secondary | ICD-10-CM

## 2018-06-02 DIAGNOSIS — Z5111 Encounter for antineoplastic chemotherapy: Secondary | ICD-10-CM | POA: Diagnosis not present

## 2018-06-02 DIAGNOSIS — Z171 Estrogen receptor negative status [ER-]: Secondary | ICD-10-CM

## 2018-06-02 MED ORDER — TBO-FILGRASTIM 480 MCG/0.8ML ~~LOC~~ SOSY
PREFILLED_SYRINGE | SUBCUTANEOUS | Status: AC
  Filled 2018-06-02: qty 0.8

## 2018-06-02 MED ORDER — TBO-FILGRASTIM 480 MCG/0.8ML ~~LOC~~ SOSY
480.0000 ug | PREFILLED_SYRINGE | Freq: Once | SUBCUTANEOUS | Status: AC
  Administered 2018-06-02: 480 ug via SUBCUTANEOUS

## 2018-06-02 NOTE — Patient Instructions (Signed)
Tbo-Filgrastim injection What is this medicine? TBO-FILGRASTIM (T B O fil GRA stim) is a granulocyte colony-stimulating factor that stimulates the growth of neutrophils, a type of white blood cell important in the body's fight against infection. It is used to reduce the incidence of fever and infection in patients with certain types of cancer who are receiving chemotherapy that affects the bone marrow. This medicine may be used for other purposes; ask your health care provider or pharmacist if you have questions. COMMON BRAND NAME(S): Granix What should I tell my health care provider before I take this medicine? They need to know if you have any of these conditions: -bone scan or tests planned -kidney disease -sickle cell anemia -an unusual or allergic reaction to tbo-filgrastim, filgrastim, pegfilgrastim, other medicines, foods, dyes, or preservatives -pregnant or trying to get pregnant -breast-feeding How should I use this medicine? This medicine is for injection under the skin. If you get this medicine at home, you will be taught how to prepare and give this medicine. Refer to the Instructions for Use that come with your medication packaging. Use exactly as directed. Take your medicine at regular intervals. Do not take your medicine more often than directed. It is important that you put your used needles and syringes in a special sharps container. Do not put them in a trash can. If you do not have a sharps container, call your pharmacist or healthcare provider to get one. Talk to your pediatrician regarding the use of this medicine in children. Special care may be needed. Overdosage: If you think you have taken too much of this medicine contact a poison control center or emergency room at once. NOTE: This medicine is only for you. Do not share this medicine with others. What if I miss a dose? It is important not to miss your dose. Call your doctor or health care professional if you miss a  dose. What may interact with this medicine? This medicine may interact with the following medications: -medicines that may cause a release of neutrophils, such as lithium This list may not describe all possible interactions. Give your health care provider a list of all the medicines, herbs, non-prescription drugs, or dietary supplements you use. Also tell them if you smoke, drink alcohol, or use illegal drugs. Some items may interact with your medicine. What should I watch for while using this medicine? You may need blood work done while you are taking this medicine. What side effects may I notice from receiving this medicine? Side effects that you should report to your doctor or health care professional as soon as possible: -allergic reactions like skin rash, itching or hives, swelling of the face, lips, or tongue -blood in the urine -dark urine -dizziness -fast heartbeat -feeling faint -shortness of breath or breathing problems -signs and symptoms of infection like fever or chills; cough; or sore throat -signs and symptoms of kidney injury like trouble passing urine or change in the amount of urine -stomach or side pain, or pain at the shoulder -sweating -swelling of the legs, ankles, or abdomen -tiredness Side effects that usually do not require medical attention (report to your doctor or health care professional if they continue or are bothersome): -bone pain -headache -muscle pain -vomiting This list may not describe all possible side effects. Call your doctor for medical advice about side effects. You may report side effects to FDA at 1-800-FDA-1088. Where should I keep my medicine? Keep out of the reach of children. Store in a refrigerator between   2 and 8 degrees C (36 and 46 degrees F). Keep in carton to protect from light. Throw away this medicine if it is left out of the refrigerator for more than 5 consecutive days. Throw away any unused medicine after the expiration  date. NOTE: This sheet is a summary. It may not cover all possible information. If you have questions about this medicine, talk to your doctor, pharmacist, or health care provider.  2018 Elsevier/Gold Standard (2015-09-17 19:07:04)  

## 2018-06-03 ENCOUNTER — Inpatient Hospital Stay: Payer: Medicaid Other

## 2018-06-03 VITALS — BP 115/57 | HR 72 | Temp 98.4°F | Resp 18

## 2018-06-03 DIAGNOSIS — Z171 Estrogen receptor negative status [ER-]: Secondary | ICD-10-CM

## 2018-06-03 DIAGNOSIS — C50411 Malignant neoplasm of upper-outer quadrant of right female breast: Secondary | ICD-10-CM

## 2018-06-03 DIAGNOSIS — Z5111 Encounter for antineoplastic chemotherapy: Secondary | ICD-10-CM | POA: Diagnosis not present

## 2018-06-03 MED ORDER — TBO-FILGRASTIM 480 MCG/0.8ML ~~LOC~~ SOSY
PREFILLED_SYRINGE | SUBCUTANEOUS | Status: AC
  Filled 2018-06-03: qty 0.8

## 2018-06-03 MED ORDER — TBO-FILGRASTIM 480 MCG/0.8ML ~~LOC~~ SOSY
480.0000 ug | PREFILLED_SYRINGE | Freq: Once | SUBCUTANEOUS | Status: AC
  Administered 2018-06-03: 480 ug via SUBCUTANEOUS

## 2018-06-04 ENCOUNTER — Inpatient Hospital Stay: Payer: Medicaid Other

## 2018-06-04 ENCOUNTER — Institutional Professional Consult (permissible substitution): Payer: Medicaid Other | Admitting: Plastic Surgery

## 2018-06-04 ENCOUNTER — Telehealth: Payer: Self-pay | Admitting: Oncology

## 2018-06-04 DIAGNOSIS — C50411 Malignant neoplasm of upper-outer quadrant of right female breast: Secondary | ICD-10-CM

## 2018-06-04 DIAGNOSIS — Z5111 Encounter for antineoplastic chemotherapy: Secondary | ICD-10-CM | POA: Diagnosis not present

## 2018-06-04 DIAGNOSIS — Z171 Estrogen receptor negative status [ER-]: Secondary | ICD-10-CM

## 2018-06-04 MED ORDER — TBO-FILGRASTIM 480 MCG/0.8ML ~~LOC~~ SOSY
480.0000 ug | PREFILLED_SYRINGE | Freq: Once | SUBCUTANEOUS | Status: AC
  Administered 2018-06-04: 480 ug via SUBCUTANEOUS

## 2018-06-04 NOTE — Telephone Encounter (Signed)
Pt sched per 10/25 sch message. Gave pt avs and calendar

## 2018-06-04 NOTE — Patient Instructions (Signed)
Tbo-Filgrastim injection What is this medicine? TBO-FILGRASTIM (T B O fil GRA stim) is a granulocyte colony-stimulating factor that stimulates the growth of neutrophils, a type of white blood cell important in the body's fight against infection. It is used to reduce the incidence of fever and infection in patients with certain types of cancer who are receiving chemotherapy that affects the bone marrow. This medicine may be used for other purposes; ask your health care provider or pharmacist if you have questions. COMMON BRAND NAME(S): Granix What should I tell my health care provider before I take this medicine? They need to know if you have any of these conditions: -bone scan or tests planned -kidney disease -sickle cell anemia -an unusual or allergic reaction to tbo-filgrastim, filgrastim, pegfilgrastim, other medicines, foods, dyes, or preservatives -pregnant or trying to get pregnant -breast-feeding How should I use this medicine? This medicine is for injection under the skin. If you get this medicine at home, you will be taught how to prepare and give this medicine. Refer to the Instructions for Use that come with your medication packaging. Use exactly as directed. Take your medicine at regular intervals. Do not take your medicine more often than directed. It is important that you put your used needles and syringes in a special sharps container. Do not put them in a trash can. If you do not have a sharps container, call your pharmacist or healthcare provider to get one. Talk to your pediatrician regarding the use of this medicine in children. Special care may be needed. Overdosage: If you think you have taken too much of this medicine contact a poison control center or emergency room at once. NOTE: This medicine is only for you. Do not share this medicine with others. What if I miss a dose? It is important not to miss your dose. Call your doctor or health care professional if you miss a  dose. What may interact with this medicine? This medicine may interact with the following medications: -medicines that may cause a release of neutrophils, such as lithium This list may not describe all possible interactions. Give your health care provider a list of all the medicines, herbs, non-prescription drugs, or dietary supplements you use. Also tell them if you smoke, drink alcohol, or use illegal drugs. Some items may interact with your medicine. What should I watch for while using this medicine? You may need blood work done while you are taking this medicine. What side effects may I notice from receiving this medicine? Side effects that you should report to your doctor or health care professional as soon as possible: -allergic reactions like skin rash, itching or hives, swelling of the face, lips, or tongue -blood in the urine -dark urine -dizziness -fast heartbeat -feeling faint -shortness of breath or breathing problems -signs and symptoms of infection like fever or chills; cough; or sore throat -signs and symptoms of kidney injury like trouble passing urine or change in the amount of urine -stomach or side pain, or pain at the shoulder -sweating -swelling of the legs, ankles, or abdomen -tiredness Side effects that usually do not require medical attention (report to your doctor or health care professional if they continue or are bothersome): -bone pain -headache -muscle pain -vomiting This list may not describe all possible side effects. Call your doctor for medical advice about side effects. You may report side effects to FDA at 1-800-FDA-1088. Where should I keep my medicine? Keep out of the reach of children. Store in a refrigerator between   2 and 8 degrees C (36 and 46 degrees F). Keep in carton to protect from light. Throw away this medicine if it is left out of the refrigerator for more than 5 consecutive days. Throw away any unused medicine after the expiration  date. NOTE: This sheet is a summary. It may not cover all possible information. If you have questions about this medicine, talk to your doctor, pharmacist, or health care provider.  2018 Elsevier/Gold Standard (2015-09-17 19:07:04)  

## 2018-06-05 ENCOUNTER — Inpatient Hospital Stay: Payer: Medicaid Other

## 2018-06-05 VITALS — BP 114/93 | HR 81 | Temp 98.3°F | Resp 18

## 2018-06-05 DIAGNOSIS — Z5111 Encounter for antineoplastic chemotherapy: Secondary | ICD-10-CM | POA: Diagnosis not present

## 2018-06-05 MED ORDER — TBO-FILGRASTIM 480 MCG/0.8ML ~~LOC~~ SOSY
PREFILLED_SYRINGE | SUBCUTANEOUS | Status: AC
  Filled 2018-06-05: qty 0.8

## 2018-06-05 MED ORDER — TBO-FILGRASTIM 480 MCG/0.8ML ~~LOC~~ SOSY
480.0000 ug | PREFILLED_SYRINGE | Freq: Once | SUBCUTANEOUS | Status: AC
  Administered 2018-06-05: 480 ug via SUBCUTANEOUS

## 2018-06-05 NOTE — Patient Instructions (Signed)
Tbo-Filgrastim injection What is this medicine? TBO-FILGRASTIM (T B O fil GRA stim) is a granulocyte colony-stimulating factor that stimulates the growth of neutrophils, a type of white blood cell important in the body's fight against infection. It is used to reduce the incidence of fever and infection in patients with certain types of cancer who are receiving chemotherapy that affects the bone marrow. This medicine may be used for other purposes; ask your health care provider or pharmacist if you have questions. COMMON BRAND NAME(S): Granix What should I tell my health care provider before I take this medicine? They need to know if you have any of these conditions: -bone scan or tests planned -kidney disease -sickle cell anemia -an unusual or allergic reaction to tbo-filgrastim, filgrastim, pegfilgrastim, other medicines, foods, dyes, or preservatives -pregnant or trying to get pregnant -breast-feeding How should I use this medicine? This medicine is for injection under the skin. If you get this medicine at home, you will be taught how to prepare and give this medicine. Refer to the Instructions for Use that come with your medication packaging. Use exactly as directed. Take your medicine at regular intervals. Do not take your medicine more often than directed. It is important that you put your used needles and syringes in a special sharps container. Do not put them in a trash can. If you do not have a sharps container, call your pharmacist or healthcare provider to get one. Talk to your pediatrician regarding the use of this medicine in children. Special care may be needed. Overdosage: If you think you have taken too much of this medicine contact a poison control center or emergency room at once. NOTE: This medicine is only for you. Do not share this medicine with others. What if I miss a dose? It is important not to miss your dose. Call your doctor or health care professional if you miss a  dose. What may interact with this medicine? This medicine may interact with the following medications: -medicines that may cause a release of neutrophils, such as lithium This list may not describe all possible interactions. Give your health care provider a list of all the medicines, herbs, non-prescription drugs, or dietary supplements you use. Also tell them if you smoke, drink alcohol, or use illegal drugs. Some items may interact with your medicine. What should I watch for while using this medicine? You may need blood work done while you are taking this medicine. What side effects may I notice from receiving this medicine? Side effects that you should report to your doctor or health care professional as soon as possible: -allergic reactions like skin rash, itching or hives, swelling of the face, lips, or tongue -blood in the urine -dark urine -dizziness -fast heartbeat -feeling faint -shortness of breath or breathing problems -signs and symptoms of infection like fever or chills; cough; or sore throat -signs and symptoms of kidney injury like trouble passing urine or change in the amount of urine -stomach or side pain, or pain at the shoulder -sweating -swelling of the legs, ankles, or abdomen -tiredness Side effects that usually do not require medical attention (report to your doctor or health care professional if they continue or are bothersome): -bone pain -headache -muscle pain -vomiting This list may not describe all possible side effects. Call your doctor for medical advice about side effects. You may report side effects to FDA at 1-800-FDA-1088. Where should I keep my medicine? Keep out of the reach of children. Store in a refrigerator between   2 and 8 degrees C (36 and 46 degrees F). Keep in carton to protect from light. Throw away this medicine if it is left out of the refrigerator for more than 5 consecutive days. Throw away any unused medicine after the expiration  date. NOTE: This sheet is a summary. It may not cover all possible information. If you have questions about this medicine, talk to your doctor, pharmacist, or health care provider.  2018 Elsevier/Gold Standard (2015-09-17 19:07:04)  

## 2018-06-07 ENCOUNTER — Inpatient Hospital Stay: Payer: Medicaid Other

## 2018-06-07 ENCOUNTER — Encounter: Payer: Self-pay | Admitting: Adult Health

## 2018-06-07 ENCOUNTER — Telehealth: Payer: Self-pay | Admitting: Adult Health

## 2018-06-07 ENCOUNTER — Inpatient Hospital Stay (HOSPITAL_BASED_OUTPATIENT_CLINIC_OR_DEPARTMENT_OTHER): Payer: Medicaid Other | Admitting: Adult Health

## 2018-06-07 ENCOUNTER — Ambulatory Visit: Payer: Medicaid Other | Admitting: Adult Health

## 2018-06-07 VITALS — BP 114/80 | HR 71 | Temp 98.2°F | Resp 18 | Ht 64.0 in | Wt 202.4 lb

## 2018-06-07 DIAGNOSIS — Z171 Estrogen receptor negative status [ER-]: Secondary | ICD-10-CM

## 2018-06-07 DIAGNOSIS — Z95828 Presence of other vascular implants and grafts: Secondary | ICD-10-CM

## 2018-06-07 DIAGNOSIS — C50411 Malignant neoplasm of upper-outer quadrant of right female breast: Secondary | ICD-10-CM

## 2018-06-07 DIAGNOSIS — J441 Chronic obstructive pulmonary disease with (acute) exacerbation: Secondary | ICD-10-CM

## 2018-06-07 DIAGNOSIS — F17210 Nicotine dependence, cigarettes, uncomplicated: Secondary | ICD-10-CM

## 2018-06-07 DIAGNOSIS — J45901 Unspecified asthma with (acute) exacerbation: Secondary | ICD-10-CM

## 2018-06-07 DIAGNOSIS — Z5111 Encounter for antineoplastic chemotherapy: Secondary | ICD-10-CM | POA: Diagnosis not present

## 2018-06-07 LAB — COMPREHENSIVE METABOLIC PANEL
ALK PHOS: 109 U/L (ref 38–126)
ALT: 39 U/L (ref 0–44)
ANION GAP: 9 (ref 5–15)
AST: 22 U/L (ref 15–41)
Albumin: 3.9 g/dL (ref 3.5–5.0)
BILIRUBIN TOTAL: 0.7 mg/dL (ref 0.3–1.2)
BUN: 24 mg/dL — ABNORMAL HIGH (ref 6–20)
CALCIUM: 8.8 mg/dL — AB (ref 8.9–10.3)
CO2: 24 mmol/L (ref 22–32)
Chloride: 105 mmol/L (ref 98–111)
Creatinine, Ser: 0.79 mg/dL (ref 0.44–1.00)
Glucose, Bld: 87 mg/dL (ref 70–99)
POTASSIUM: 3.1 mmol/L — AB (ref 3.5–5.1)
Sodium: 138 mmol/L (ref 135–145)
TOTAL PROTEIN: 6.9 g/dL (ref 6.5–8.1)

## 2018-06-07 LAB — CBC WITH DIFFERENTIAL/PLATELET
ABS IMMATURE GRANULOCYTES: 0.01 10*3/uL (ref 0.00–0.07)
BASOS PCT: 1 %
Basophils Absolute: 0 10*3/uL (ref 0.0–0.1)
EOS ABS: 0 10*3/uL (ref 0.0–0.5)
Eosinophils Relative: 1 %
HCT: 26.6 % — ABNORMAL LOW (ref 36.0–46.0)
Hemoglobin: 8.4 g/dL — ABNORMAL LOW (ref 12.0–15.0)
Immature Granulocytes: 0 %
Lymphocytes Relative: 38 %
Lymphs Abs: 1.2 10*3/uL (ref 0.7–4.0)
MCH: 30.2 pg (ref 26.0–34.0)
MCHC: 31.6 g/dL (ref 30.0–36.0)
MCV: 95.7 fL (ref 80.0–100.0)
MONO ABS: 0.1 10*3/uL (ref 0.1–1.0)
Monocytes Relative: 3 %
NEUTROS ABS: 1.8 10*3/uL (ref 1.7–7.7)
Neutrophils Relative %: 57 %
PLATELETS: 81 10*3/uL — AB (ref 150–400)
RBC: 2.78 MIL/uL — AB (ref 3.87–5.11)
RDW: 16.8 % — AB (ref 11.5–15.5)
WBC: 3.1 10*3/uL — AB (ref 4.0–10.5)
nRBC: 0 % (ref 0.0–0.2)

## 2018-06-07 MED ORDER — VENLAFAXINE HCL 75 MG PO TABS: 75.0000 mg | ORAL_TABLET | Freq: Two times a day (BID) | ORAL | 3 refills | Status: DC

## 2018-06-07 MED ORDER — SODIUM CHLORIDE 0.9% FLUSH
10.0000 mL | INTRAVENOUS | Status: DC | PRN
  Administered 2018-06-07: 10 mL via INTRAVENOUS
  Filled 2018-06-07: qty 10

## 2018-06-07 MED ORDER — HEPARIN SOD (PORK) LOCK FLUSH 100 UNIT/ML IV SOLN
500.0000 [IU] | Freq: Once | INTRAVENOUS | Status: AC
  Administered 2018-06-07: 500 [IU] via INTRAVENOUS
  Filled 2018-06-07: qty 5

## 2018-06-07 MED FILL — VENLAFAXINE HCL 75 MG TAB: 75 | 30 days supply | Qty: 60 | Fill #0

## 2018-06-07 NOTE — Progress Notes (Signed)
Jacqueline Good  [phone] [phone]     ID: Jacqueline Good DOB: [birth year]  MR#: [id number]  MPN#:[id number]  Patient Care Team: [name], MD as PCP - General (Internal Medicine) Alphonsa Overall, MD as Consulting Physician (General Surgery) [name], [name], MD as Consulting Physician (Oncology) Eppie Gibson, MD as Attending Physician (Radiation Oncology) OTHER MD: Donnal Moat, PA-C [FAX [phone]]  CHIEF COMPLAINT: Estrogen receptor negative breast cancer  CURRENT TREATMENT: Neoadjuvant chemoimmunotherapy  INTERVAL HISTORY: Jacqueline Good returns today for follow up of her estrogen receptor negative but HER-2 positive breast cancer accompanied by her mother and brother.  The patient is receiving neoadjuvant chemoimmunotherapy with carboplatin, docetaxel, trastuzumab, and pertuzumab every 21 days with Granix support on day 3-6. Today is day 8 cycle 4.  Pertuzumab omitted due to diarrhea.  Gemcitabine was substituted for Docetaxel due to early peripheral neuropathy.  Jacqueline Good did moderately well.  She notes that she has not had any nausea.  She did receive Granix instead of Neulasta.  She did fine on days 3 and 4, however on days 5 and 6 she became achy and fatigued.  She noted that diarrhea started in the middle of the night Saturday.  She has had 8-9 episodes, moderate volume.  She took Imodium, continued to have diarrhea, though it slowed down, but didn't take anything else.  She denies diarrhea today.     REVIEW OF SYSTEMS: Jacqueline Good is doing well today.  She denies any fevers or chills.  She is without nausea, vomiting.  She denies any chest pain, palpitations, cough, shortness of breath.  A detailed ROS was otherwise non contributory today.     HISTORY OF CURRENT ILLNESS: From the original intake note:  Jacqueline Good had routine screening mammography on 02/05/2018 showing a possible abnormality in the right breast. She underwent unilateral  right diagnostic mammography with tomography and right breast ultrasonography at [facility] on 02/15/2018 showing: Highly suspicious right breast mass at 10 o'clock position.  There was a second, 0.6 cm lesion at the 10:00 radiant which has not been biopsied.  Suspicious focal cortical thickening of a single right axillary lymph node.  Accordingly on 02/25/2018 she proceeded to biopsy of the right breast area in question. The pathology from this procedure showed ([id number]): Breast, right, needle core biopsy, OUQ with microscopic focus of invasive ductal carcinoma, grade 2, arising in a background of high grade ductal carcinoma in situ. 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   The patient's subsequent history is as detailed below.    PAST MEDICAL HISTORY: Past Medical History:  Diagnosis Date  . Arthritis   . Asthma   . breast ca dx'd 02/2018   right  . Depression     PAST SURGICAL HISTORY: Past Surgical History:  Procedure Laterality Date  . endometrial ablation    . IR IMAGING GUIDED PORT INSERTION  03/18/2018  . MYOMECTOMY      FAMILY HISTORY Family History  Problem Relation Age of Onset  . Lupus Sister   . Heart disease Sister   . Breast cancer Mother    She notes that her father is currently 46 years old. Patients' mother is currently 74 years old and she was diagnosed with breast cancer at age 64. The patient has 4 brothers and 1 sister. She has a maternal aunt with breast cancer and her maternal great grandmother with had ovarian cancer.  GYNECOLOGIC HISTORY:  No LMP recorded. Patient is postmenopausal. Menarche: 57 years old Age at first live birth: 57 years old [place] P1 LMP: 13-14 years ago s/p ablation Contraceptive: no HRT: no  Hysterectomy: no SO: no  SOCIAL HISTORY: She is currently unemployed.  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 She lives alone without pets. Her  daughter is Jacqueline Good who lives in [place] and works in Therapist, art.  The patient has one grandchild. She goes to a [employer].       ADVANCED DIRECTIVES: Not in place.  At the 03/03/2018 visit the patient was given the appropriate documents to complete and notarized at her discretion   HEALTH MAINTENANCE: Social History   Tobacco Use  . Smoking status: Current Every Day Smoker    Packs/day: 1.00    Years: 20.00    Pack years: 20.00    Types: Cigarettes  . Smokeless tobacco: Never Used  Substance Use Topics  . Alcohol use: Yes    Comment: occasionally  . Drug use: No     Colonoscopy: Never  PAP:   Bone density: Never   Allergies  Allergen Reactions  . Gadolinium Derivatives Itching and Cough    Pt began sneezing and coughing as soon as MRI contrast was injected. Severe nasal congestion. No rash or hives no SOB.   . Iodinated Diagnostic Agents Itching    Current Outpatient Medications  Medication Sig Dispense Refill  . albuterol (VENTOLIN HFA) 108 (90 Base) MCG/ACT inhaler INHALE 2 PUFFS INTO THE LUNGS EVERY 6 HOURS AS NEEDED FOR WHEEZING 54 g 3  . ALPRAZolam (XANAX) 1 MG tablet Take 1 mg by mouth 3 (three) times daily as needed. for anxiety  2  . busPIRone (BUSPAR) 30 MG tablet Take 30 mg by mouth 2 (two) times daily.  2  . cetirizine-pseudoephedrine (ZYRTEC-D) 5-120 MG tablet Take 1 tablet by mouth daily. 15 tablet 0  . cholestyramine (QUESTRAN) 4 g packet Take 1 packet (4 g total) by mouth 2 (two) times daily. 60 each 12  . dexamethasone (DECADRON) 4 MG tablet Take 2 tablets (8 mg total) by mouth 2 (two) times daily. Start the day before Taxotere. Take once the day after, then 2 times a day x 2d. 30 tablet 1  . diphenoxylate-atropine (LOMOTIL) 2.5-0.025 MG tablet Take 1 tablet by mouth 4 (four) times daily as needed for diarrhea or loose stools. 30 tablet 0  . DULoxetine (CYMBALTA) 60 MG capsule TAKE 2 CAPSULES BY MOUTH DAILY IN THE MORNING.  5   . fluticasone (FLONASE) 50 MCG/ACT nasal spray Place 2 sprays into both nostrils daily. 1 g 0  . Fluticasone-Salmeterol (ADVAIR DISKUS) 250-50 MCG/DOSE AEPB INHALE 1 PUFF INTO THE LUNGS 2 TIMES DAILY. 60 each 2  . lidocaine-prilocaine (EMLA) cream Apply to affected area once 30 g 3  . loperamide (IMODIUM) 2 MG capsule Take 2 mg by mouth as needed for diarrhea or loose stools.    Jacqueline Good Kitchen LORazepam (ATIVAN) 0.5 MG tablet Take 1 tablet (0.5 mg total) by mouth at bedtime as needed (Nausea or vomiting). 30 tablet 0  . naproxen (NAPROSYN) 500 MG tablet Take 1 tablet (500 mg total) by mouth 2 (two) times daily with a meal. 60 tablet 3  . omeprazole (PRILOSEC) 40 MG capsule Take 1 capsule (40 mg total) by mouth daily. 30 capsule 2  . ondansetron (ZOFRAN) 8 MG tablet Take 1 tablet (8 mg total) by mouth every 8 (eight) hours as needed for nausea  or vomiting. Do not take the first three days after chemo 20 tablet 0  . prochlorperazine (COMPAZINE) 10 MG tablet Take 1 tablet (10 mg total) by mouth every 6 (six) hours as needed (Nausea or vomiting). 30 tablet 1  . venlafaxine (EFFEXOR) 75 MG tablet Take 75 mg by mouth 2 (two) times daily.    Jacqueline Good Kitchen zolpidem (AMBIEN) 10 MG tablet Take 10 mg by mouth at bedtime as needed for sleep.     No current facility-administered medications for this visit.     OBJECTIVE:  Vitals:   06/07/18 1020  BP: 114/80  Pulse: 71  Resp: 18  Temp: 98.2 F (36.8 C)  SpO2: 100%     Body mass index is 34.74 kg/m.   Wt Readings from Last 3 Encounters:  06/07/18 202 lb 6.4 oz (91.8 kg)  05/31/18 204 lb 1.6 oz (92.6 kg)  05/04/18 204 lb 11.2 oz (92.9 kg)  ECOG FS:1 - Symptomatic but completely ambulatory GENERAL: Patient is a well appearing female in no acute distress HEENT:  Sclerae anicteric.  Oropharynx clear and moist. No ulcerations or evidence of oropharyngeal candidiasis. Neck is supple.  NODES:  No cervical, supraclavicular, or axillary lymphadenopathy palpated.  BREAST EXAM:   Declined today since examined last week LUNGS:  Clear to auscultation bilaterally.  No wheezes or rhonchi. HEART:  Regular rate and rhythm. No murmur appreciated. ABDOMEN:  Soft, nontender.  Positive, normoactive bowel sounds. No organomegaly palpated. MSK:  No focal spinal tenderness to palpation. Full range of motion bilaterally in the upper extremities. EXTREMITIES:  No peripheral edema.   SKIN:  Clear with no obvious rashes or skin changes. No nail dyscrasia. NEURO:  Nonfocal. Well oriented.  Appropriate affect.    LAB RESULTS:  CMP     Component Value Date/Time   NA 138 06/07/2018 0911   K 3.1 (L) 06/07/2018 0911   CL 105 06/07/2018 0911   CO2 24 06/07/2018 0911   GLUCOSE 87 06/07/2018 0911   BUN 24 (H) 06/07/2018 0911   CREATININE 0.79 06/07/2018 0911   CREATININE 0.80 03/03/2018 0830   CREATININE 0.83 09/24/2016 1151   CALCIUM 8.8 (L) 06/07/2018 0911   PROT 6.9 06/07/2018 0911   ALBUMIN 3.9 06/07/2018 0911   AST 22 06/07/2018 0911   AST 15 03/03/2018 0830   ALT 39 06/07/2018 0911   ALT 16 03/03/2018 0830   ALKPHOS 109 06/07/2018 0911   BILITOT 0.7 06/07/2018 0911   BILITOT 0.5 03/03/2018 0830   GFRNONAA >60 06/07/2018 0911   GFRNONAA >60 03/03/2018 0830   GFRNONAA 79 09/24/2016 1151   GFRAA >60 06/07/2018 0911   GFRAA >60 03/03/2018 0830   GFRAA >89 09/24/2016 1151    No results found for: TOTALPROTELP, ALBUMINELP, A1GS, A2GS, BETS, BETA2SER, GAMS, MSPIKE, SPEI  No results found for: KPAFRELGTCHN, LAMBDASER, KAPLAMBRATIO  Lab Results  Component Value Date   WBC 3.1 (L) 06/07/2018   NEUTROABS 1.8 06/07/2018   HGB 8.4 (L) 06/07/2018   HCT 26.6 (L) 06/07/2018   MCV 95.7 06/07/2018   PLT 81 (L) 06/07/2018    _0 @  No results found for: LABCA2  No components found for: TWSFKC127  No results for input(s): INR in the last 168 hours.  No results found for: LABCA2  No results found for: NTZ001  No results found for: VCB449  No results  found for: QPR916  No results found for: CA2729  No components found for: HGQUANT  No results found for: CEA1 / No results  found for: CEA1   No results found for: AFPTUMOR  No results found for: [place]  No results found for: PSA1  Appointment on 06/07/2018  Component Date Value Ref Range Status  . Sodium 06/07/2018 138  135 - 145 mmol/L Final  . Potassium 06/07/2018 3.1* 3.5 - 5.1 mmol/L Final  . Chloride 06/07/2018 105  98 - 111 mmol/L Final  . CO2 06/07/2018 24  22 - 32 mmol/L Final  . Glucose, Bld 06/07/2018 87  70 - 99 mg/dL Final  . BUN 06/07/2018 24* 6 - 20 mg/dL Final  . Creatinine, Ser 06/07/2018 0.79  0.44 - 1.00 mg/dL Final  . Calcium 06/07/2018 8.8* 8.9 - 10.3 mg/dL Final  . Total Protein 06/07/2018 6.9  6.5 - 8.1 g/dL Final  . Albumin 06/07/2018 3.9  3.5 - 5.0 g/dL Final  . AST 06/07/2018 22  15 - 41 U/L Final  . ALT 06/07/2018 39  0 - 44 U/L Final  . Alkaline Phosphatase 06/07/2018 109  38 - 126 U/L Final  . Total Bilirubin 06/07/2018 0.7  0.3 - 1.2 mg/dL Final  . GFR calc non Af Amer 06/07/2018 >60  >60 mL/min Final  . GFR calc Af Amer 06/07/2018 >60  >60 mL/min Final   Comment: (NOTE) The eGFR has been calculated using the CKD EPI equation. This calculation has not been validated in all clinical situations. eGFR's persistently <60 mL/min signify possible Chronic Kidney Disease.   Georgiann Hahn gap 06/07/2018 9  5 - 15 Final   Performed at [facility], [place] [address] [place], [place] [address]  . WBC 06/07/2018 3.1* 4.0 - 10.5 K/uL Final  . RBC 06/07/2018 2.78* 3.87 - 5.11 MIL/uL Final  . Hemoglobin 06/07/2018 8.4* 12.0 - 15.0 g/dL Final  . HCT 06/07/2018 26.6* 36.0 - 46.0 % Final  . MCV 06/07/2018 95.7  80.0 - 100.0 fL Final  . MCH 06/07/2018 30.2  26.0 - 34.0 pg Final  . MCHC 06/07/2018 31.6  30.0 - 36.0 g/dL Final  . RDW 06/07/2018 16.8* 11.5 - 15.5 % Final  . Platelets 06/07/2018 81* 150 - 400 K/uL Final  . nRBC 06/07/2018  0.0  0.0 - 0.2 % Final  . Neutrophils Relative % 06/07/2018 57  % Final  . Neutro Abs 06/07/2018 1.8  1.7 - 7.7 K/uL Final  . Lymphocytes Relative 06/07/2018 38  % Final  . Lymphs Abs 06/07/2018 1.2  0.7 - 4.0 K/uL Final  . Monocytes Relative 06/07/2018 3  % Final  . Monocytes Absolute 06/07/2018 0.1  0.1 - 1.0 K/uL Final  . Eosinophils Relative 06/07/2018 1  % Final  . Eosinophils Absolute 06/07/2018 0.0  0.0 - 0.5 K/uL Final  . Basophils Relative 06/07/2018 1  % Final  . Basophils Absolute 06/07/2018 0.0  0.0 - 0.1 K/uL Final  . Immature Granulocytes 06/07/2018 0  % Final  . Abs Immature Granulocytes 06/07/2018 0.01  0.00 - 0.07 K/uL Final   Performed at [facility], [place] [address] [place], [place] [address]    (this displays the last labs from the last 3 days)  No results found for: TOTALPROTELP, ALBUMINELP, A1GS, A2GS, BETS, BETA2SER, GAMS, MSPIKE, SPEI (this displays SPEP labs)  No results found for: KPAFRELGTCHN, LAMBDASER, KAPLAMBRATIO (kappa/lambda light chains)  No results found for: HGBA, HGBA2QUANT, HGBFQUANT, HGBSQUAN (Hemoglobinopathy evaluation)   No results found for: LDH  No results found for: IRON, TIBC, IRONPCTSAT (Iron and TIBC)  No results found for:  FERRITIN  Urinalysis    Component Value Date/Time   COLORURINE CANCELED 09/24/2016 1151   APPEARANCEUR CANCELED 09/24/2016 1151   LABSPEC CANCELED 09/24/2016 1151   PHURINE CANCELED 09/24/2016 1151   GLUCOSEU CANCELED 09/24/2016 1151   HGBUR CANCELED 09/24/2016 1151   BILIRUBINUR CANCELED 09/24/2016 1151   KETONESUR CANCELED 09/24/2016 1151   PROTEINUR CANCELED 09/24/2016 1151   NITRITE CANCELED 09/24/2016 1151   LEUKOCYTESUR CANCELED 09/24/2016 1151     STUDIES:  No results found.  ELIGIBLE FOR AVAILABLE RESEARCH PROTOCOL:BCEP  ASSESSMENT: 57 y.o. [place] woman status post right breast upper outer quadrant biopsy 02/25/2018 for a clinical T2 pN1, stage IIB invasive  ductal carcinoma, estrogen and progesterone receptor negative, HER-2 amplified, with an MIB-1 of 50%.  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  (4) definitive surgery with targeted axillary dissection to follow  (5) adjuvant radiation  (6) left adrenal adenoma measuring 2.9 cm on CT of the chest 03/16/2018  PLAN:  Ercie is doing moderately well. Her labs are stable, though slightly lower than they were while she was receiving the Docetaxel.  I will review this with Dr. Jana Hakim.  Hilaria and I reviewed that she will need to receive anti-Her2 therapy every 3 weeks for at minimum of 6 months.  We reviewed how the Trastuzumab works and the reason for needing to do this even after surgery.    I reviewed her upcoming schedule with her and verified that she will receive cycle 5 on 11/11, and cycle 6 on 12/2.  I reviewed that I will order her breast MRI on cycle 5 day 1, with our goal of it being done the week of 12/2.  I informed her that she will then be seen by Dr. Lucia Gaskins, to review surgical planning.  We will see her on 12/27 for Trastuzumab (which can change if needed).  I will also ask one of our navigators to reach out to her to f/u with her this week to ensure she understands the plan.  Chartia and I reviewed her upcoming schedule.  She would like to continue with the Granix  instead of Neulasta following chemotherapy, and this approach is reasonable.    Jaree and I reviewed her diarrhea.  She has taken imodium.  We reviewed how to take her anti diarrheal medications.    Keona will return in 2 weeks for labs, f/u, and her next chemotherapy.  She knows to call for any questions or concerns prior to her next appointment.    A total of (30) minutes of face-to-face time was spent with this patient with greater than 50% of that time in counseling and care-coordination.   Wilber Bihari, NP  06/07/18 10:39 AM [facility] [address] [place], [place] [address] Tel. [phone]    Fax. [phone]

## 2018-06-07 NOTE — Telephone Encounter (Signed)
Gave patient avs.  Not able to print calendar due to system problems.  Asking infusion for add-on for the 12/27 appt (@ 12 pm).  Patient is my chart active.

## 2018-06-08 ENCOUNTER — Telehealth: Payer: Self-pay | Admitting: *Deleted

## 2018-06-08 NOTE — Telephone Encounter (Signed)
This RN returned VM to pt per her call asking " could Dr Jana Hakim prescribe something for my pain - I am achy all over and have been for the past 3 days "  This RN returned call to pt - noted pt was seen yesterday and inquired with her if she asked about something for pain at that visit .  Kurstyn states provider " asked how she was doing and I told her I was aching but that was about it "  This RN inquired what pt has tried for relief with pt stating she has tried tylenol and claritin with little benefit.  Noted pt has naprosyn on med list - inquiry made if pt has used it.  Arvis stated " someone told me that that is not good for me to take "  This RN informed pt concern is if the medication is used chronically for extended period of time.  Naprosyn is a good medication for aches and ok to use for short duration.  Plan per call is for pt to use the naprosyn and call if she continues to have discomfort.  No other needs at this time.

## 2018-06-09 ENCOUNTER — Other Ambulatory Visit: Payer: Self-pay | Admitting: *Deleted

## 2018-06-09 DIAGNOSIS — G8929 Other chronic pain: Secondary | ICD-10-CM

## 2018-06-09 DIAGNOSIS — M545 Low back pain: Principal | ICD-10-CM

## 2018-06-09 MED ORDER — NAPROXEN 500 MG PO TABS: 500.0000 mg | ORAL_TABLET | Freq: Two times a day (BID) | ORAL | 3 refills | Status: DC

## 2018-06-09 MED FILL — NAPROXEN 500 MG TABLET: 500 | 30 days supply | Qty: 60 | Fill #0

## 2018-06-09 NOTE — Telephone Encounter (Signed)
Jacqueline Good called this RN today and stating she had good benefit with use of the naprosyn for general body aches likely caused by CSF for support therapy.  She does need a refill.  Refill sent to verified pharmacy.

## 2018-06-10 ENCOUNTER — Institutional Professional Consult (permissible substitution): Payer: Medicaid Other | Admitting: Plastic Surgery

## 2018-06-13 DIAGNOSIS — F411 Generalized anxiety disorder: Secondary | ICD-10-CM | POA: Insufficient documentation

## 2018-06-13 DIAGNOSIS — G4700 Insomnia, unspecified: Secondary | ICD-10-CM | POA: Insufficient documentation

## 2018-06-13 DIAGNOSIS — F331 Major depressive disorder, recurrent, moderate: Secondary | ICD-10-CM

## 2018-06-13 DIAGNOSIS — F429 Obsessive-compulsive disorder, unspecified: Secondary | ICD-10-CM

## 2018-06-15 ENCOUNTER — Encounter: Payer: Self-pay | Admitting: Plastic Surgery

## 2018-06-15 ENCOUNTER — Ambulatory Visit: Payer: No Typology Code available for payment source

## 2018-06-15 ENCOUNTER — Other Ambulatory Visit: Payer: No Typology Code available for payment source

## 2018-06-15 ENCOUNTER — Ambulatory Visit (INDEPENDENT_AMBULATORY_CARE_PROVIDER_SITE_OTHER): Payer: Medicaid Other | Admitting: Plastic Surgery

## 2018-06-15 DIAGNOSIS — Z171 Estrogen receptor negative status [ER-]: Secondary | ICD-10-CM

## 2018-06-15 DIAGNOSIS — C50411 Malignant neoplasm of upper-outer quadrant of right female breast: Secondary | ICD-10-CM | POA: Diagnosis not present

## 2018-06-15 NOTE — Progress Notes (Signed)
Patient ID: Jacqueline Good, female    DOB: Apr 26, 1961, 57 y.o.   MRN: [id number]   Chief Complaint  Patient presents with  . Breast Cancer    The patient is a 57 year old black female here with her best friend for consultation for breast reconstruction.  The patient was diagnosed with RIGHT breast cancer (Estrogen negative HER-2 positive).  She has a left side Port-A-Cath in place and has been getting chemotherapy with Dr. Payton Mccallum.  She has had some neuropathy and diarrhea therefore the chemo has been altered.  She is 5 feet 4 inches tall, weight 206 pounds.  Preop bra 42 DD.  Upon my measurement she is a 40 DDD.  The patient was referred by Dr. Lucia Gaskins.  She is interested in a right mastectomy with reconstruction.  At this point she is planning on postmastectomy radiation and chemotherapy.     Review of Systems  Constitutional: Negative.  Negative for activity change and appetite change.  HENT: Negative.   Eyes: Negative.   Respiratory: Negative.   Cardiovascular: Negative.  Negative for palpitations and leg swelling.  Gastrointestinal: Negative.   Endocrine: Negative.   Genitourinary: Negative.   Musculoskeletal: Negative.   Skin: Negative.  Negative for color change and wound.  Neurological: Negative.   Hematological: Negative.   Psychiatric/Behavioral: Negative.     Past Medical History:  Diagnosis Date  . Arthritis   . Asthma   . breast ca dx'd 02/2018   right  . Depression     Past Surgical History:  Procedure Laterality Date  . BREAST SURGERY    . endometrial ablation    . IR IMAGING GUIDED PORT INSERTION  03/18/2018  . MYOMECTOMY        Current Outpatient Medications:  .  albuterol (VENTOLIN HFA) 108 (90 Base) MCG/ACT inhaler, INHALE 2 PUFFS INTO THE LUNGS EVERY 6 HOURS AS NEEDED FOR WHEEZING, Disp: 54 g, Rfl: 3 .  ALPRAZolam (XANAX) 1 MG tablet, Take 1 mg by mouth every 6 (six) hours as needed (max 3.5). for anxiety, Disp: , Rfl: 2 .  busPIRone (BUSPAR) 30 MG  tablet, Take 30 mg by mouth 2 (two) times daily., Disp: , Rfl: 2 .  cetirizine-pseudoephedrine (ZYRTEC-D) 5-120 MG tablet, Take 1 tablet by mouth daily., Disp: 15 tablet, Rfl: 0 .  cholestyramine (QUESTRAN) 4 g packet, Take 1 packet (4 g total) by mouth 2 (two) times daily., Disp: 60 each, Rfl: 12 .  dexamethasone (DECADRON) 4 MG tablet, Take 2 tablets (8 mg total) by mouth 2 (two) times daily. Start the day before Taxotere. Take once the day after, then 2 times a day x 2d., Disp: 30 tablet, Rfl: 1 .  diphenoxylate-atropine (LOMOTIL) 2.5-0.025 MG tablet, Take 1 tablet by mouth 4 (four) times daily as needed for diarrhea or loose stools., Disp: 30 tablet, Rfl: 0 .  DULoxetine (CYMBALTA) 60 MG capsule, TAKE 2 CAPSULES BY MOUTH DAILY IN THE MORNING., Disp: , Rfl: 5 .  fluticasone (FLONASE) 50 MCG/ACT nasal spray, Place 2 sprays into both nostrils daily., Disp: 1 g, Rfl: 0 .  Fluticasone-Salmeterol (ADVAIR DISKUS) 250-50 MCG/DOSE AEPB, INHALE 1 PUFF INTO THE LUNGS 2 TIMES DAILY., Disp: 60 each, Rfl: 2 .  lidocaine-prilocaine (EMLA) cream, Apply to affected area once, Disp: 30 g, Rfl: 3 .  loperamide (IMODIUM) 2 MG capsule, Take 2 mg by mouth as needed for diarrhea or loose stools., Disp: , Rfl:  .  LORazepam (ATIVAN) 0.5 MG tablet, Take 1 tablet (  0.5 mg total) by mouth at bedtime as needed (Nausea or vomiting)., Disp: 30 tablet, Rfl: 0 .  naproxen (NAPROSYN) 500 MG tablet, Take 1 tablet (500 mg total) by mouth 2 (two) times daily with a meal., Disp: 60 tablet, Rfl: 3 .  omeprazole (PRILOSEC) 40 MG capsule, Take 1 capsule (40 mg total) by mouth daily., Disp: 30 capsule, Rfl: 2 .  ondansetron (ZOFRAN) 8 MG tablet, Take 1 tablet (8 mg total) by mouth every 8 (eight) hours as needed for nausea or vomiting. Do not take the first three days after chemo, Disp: 20 tablet, Rfl: 0 .  prochlorperazine (COMPAZINE) 10 MG tablet, Take 1 tablet (10 mg total) by mouth every 6 (six) hours as needed (Nausea or vomiting).,  Disp: 30 tablet, Rfl: 1 .  propranolol (INDERAL) 20 MG tablet, Take 20 mg by mouth 3 (three) times daily., Disp: , Rfl:  .  venlafaxine (EFFEXOR) 75 MG tablet, Take 1 tablet (75 mg total) by mouth 2 (two) times daily., Disp: 60 tablet, Rfl: 3 .  zolpidem (AMBIEN) 10 MG tablet, Take 10 mg by mouth at bedtime as needed for sleep (1/2-1 tab). , Disp: , Rfl:    Objective:   Vitals:   06/15/18 1106  BP: 110/60  Pulse: 79  Resp: 16  SpO2: 98%    Physical Exam  Constitutional: She is oriented to person, place, and time. She appears well-developed and well-nourished.  HENT:  Head: Normocephalic and atraumatic.  Eyes: Pupils are equal, round, and reactive to light. EOM are normal.  Neck: Normal range of motion.  Cardiovascular: Normal rate.  Pulmonary/Chest: Effort normal. No respiratory distress.  Abdominal: Soft. She exhibits no distension. There is no tenderness.  Neurological: She is alert and oriented to person, place, and time.  Skin: Skin is warm.  Psychiatric: She has a normal mood and affect. Her behavior is normal. Judgment and thought content normal.    Assessment & Plan:  Morbid obesity ([place])  Malignant neoplasm of upper-outer quadrant of right breast in female, estrogen receptor negative ([place])  Assessment and Plan:  A long, detailed conversation was had regarding the patient's options for breast reconstruction. Five main points, which are explained to all breast reconstruction patients, were discussed.  1. Breast reconstruction is an optional process.  2. Breast reconstruction is a multi-stage process which involves multiple surgeries spaced several months apart. The entire process can take over one year.  3. The major goal of breast reconstruction is to have the patient look normal in clothing. When naked, there will always be scars.  4. Asymmetries are often present during the reconstruction process. Several operations may be needed, including surgery to the non-cancerous  breast, to achieve satisfactory results.  5. No matter the reconstructive method, there are ways that the reconstruction can fail and a secondary reconstructive plan would need to be created.   A general discussion regarding all available methods of breast reconstruction were discussed. The types of reconstructions described included.  1. Tissue expander and implant based reconstruction, both single and multi-stage approaches.  2. Autologous only reconstructions, including free abdominal-tissue based reconstructions.  3. Combination procedures, particularly latissismus dorsi flaps combined with either expanders or implants.  For each of the reconstruction methods mentioned above, the risks, benefits, alternatives, scarring, and recovery time were discussed in great detail. Specific risks detailed included bleeding, infection, hematoma, seroma, scarring, pain, wound healing complications, flap loss, fat necrosis, capsular contracture, need for implant removal, donor site complications, bulge, hernia, umbilical necrosis,  need for urgent reoperation, and need for dressing changes were discussed.   Assessment  Once all reconstruction options were presented, a focused discussion was had regarding the patient's suitability for each of these procedures.  A total of 50 minutes of face-to-face time was spent in this encounter, of which >50% was spent in counseling. The patient has chronic back pain which makes a TRAM problematic for her.  After a lengthy discussion she would like to undergo immediate breast reconstruction of the right breast with the plan for placement of expander and flex HD.  If we can expand her and exchange the expander to an implant prior to radiation that would be our first choice.  If we cannot exchange the expander prior to radiation we will plan on using a latissimus myocutaneous flap.  She was given the information for second to nature with a prescription.  I also called Dr. Lucia Gaskins to  discuss and he will call be back.  [place], DO

## 2018-06-16 ENCOUNTER — Telehealth: Payer: Self-pay | Admitting: Adult Health

## 2018-06-16 NOTE — Telephone Encounter (Signed)
Returned pts call to r/s appts   °

## 2018-06-20 NOTE — Progress Notes (Addendum)
Jacqueline Good  Telephone:([phone]) [phone] Fax:(336) [phone]     ID: Jacqueline SHIPPEE DOB: July 13, 1961  MR#: [id number]  VOZ#:[id number]  Patient Care Team: [name], MD as PCP - General (Internal Medicine) Alphonsa Overall, MD as Consulting Physician (General Surgery) [name], [name], MD as Consulting Physician (Oncology) Eppie Gibson, MD as Attending Physician (Radiation Oncology) OTHER MD: Donnal Moat, PA-C [FAX [phone]]  CHIEF COMPLAINT: Estrogen receptor negative breast cancer  CURRENT TREATMENT: Neoadjuvant chemoimmunotherapy  INTERVAL HISTORY: Jacqueline Good returns today for follow up of her estrogen receptor negative but HER-2 positive breast cancer accompanied by her mother and brother.  The patient is receiving neoadjuvant chemoimmunotherapy with carboplatin, docetaxel, trastuzumab, and pertuzumab every 21 days with Granix support on day 3-6. Today is day 1 cycle 5.  Pertuzumab omitted due to diarrhea.  Gemcitabine was substituted for Docetaxel due to early peripheral neuropathy.    REVIEW OF SYSTEMS: [name] is very tearful today.  She is not feeling well and has been experiencing progressive weakness and shortness of breath.  She is anemic with a hemoglobin of 7.3.  She notes that she has been very anxious and short of breath when thinking about everything that she is going through with her treatment.  She says that the four shots of granix were very difficult for her with her past cycle and she wants to get less.  She still has some diarrhea, however it is improved.  Otherwise, a detailed ROS was non contributory.     HISTORY OF CURRENT ILLNESS: From the original intake note:  Jacqueline Good had routine screening mammography on 02/05/2018 showing a possible abnormality in the right breast. She underwent unilateral right diagnostic mammography with tomography and right breast ultrasonography at [facility] on 02/15/2018 showing: Highly suspicious right  breast mass at 10 o'clock position.  There was a second, 0.6 cm lesion at the 10:00 radiant which has not been biopsied.  Suspicious focal cortical thickening of a single right axillary lymph node.  Accordingly on 02/25/2018 she proceeded to biopsy of the right breast area in question. The pathology from this procedure showed ([id number]): Breast, right, needle core biopsy, OUQ with microscopic focus of invasive ductal carcinoma, grade 2, arising in a background of high grade ductal carcinoma in situ. 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   The patient's subsequent history is as detailed below.    PAST MEDICAL HISTORY: Past Medical History:  Diagnosis Date  . Arthritis   . Asthma   . breast ca dx'd 02/2018   right  . Depression     PAST SURGICAL HISTORY: Past Surgical History:  Procedure Laterality Date  . BREAST SURGERY    . endometrial ablation    . IR IMAGING GUIDED PORT INSERTION  03/18/2018  . MYOMECTOMY      FAMILY HISTORY Family History  Problem Relation Age of Onset  . Lupus Sister   . Heart disease Sister   . Breast cancer Mother    She notes that her father is currently 69 years old. Patients' mother is currently 33 years old and she was diagnosed with breast cancer at age 13. The patient has 4 brothers and 1 sister. She has a maternal aunt with breast cancer and her maternal great grandmother with had ovarian cancer.    GYNECOLOGIC HISTORY:  No LMP recorded. Patient is postmenopausal. Menarche: 57 years old Age at first live birth: 57 years old [place]  P1 LMP: 13-14 years ago s/p ablation Contraceptive: no HRT: no  Hysterectomy: no SO: no  SOCIAL HISTORY: She is currently unemployed.  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 She lives alone without pets. Her daughter is Jacqueline Good who lives in [place] and works in Therapist, art.  The patient has one grandchild. She goes to a  [employer].       ADVANCED DIRECTIVES: Not in place.  At the 03/03/2018 visit the patient was given the appropriate documents to complete and notarized at her discretion   HEALTH MAINTENANCE: Social History   Tobacco Use  . Smoking status: Former Smoker    Packs/day: 1.00    Years: 20.00    Pack years: 20.00    Types: Cigarettes    Last attempt to quit: 05/15/2018    Years since quitting: 0.1  . Smokeless tobacco: Never Used  Substance Use Topics  . Alcohol use: Yes    Comment: occasionally  . Drug use: No     Colonoscopy: Never  PAP:   Bone density: Never   Allergies  Allergen Reactions  . Gadolinium Derivatives Itching and Cough    Pt began sneezing and coughing as soon as MRI contrast was injected. Severe nasal congestion. No rash or hives no SOB.   . Iodinated Diagnostic Agents Itching    Current Outpatient Medications  Medication Sig Dispense Refill  . albuterol (VENTOLIN HFA) 108 (90 Base) MCG/ACT inhaler INHALE 2 PUFFS INTO THE LUNGS EVERY 6 HOURS AS NEEDED FOR WHEEZING 54 g 3  . ALPRAZolam (XANAX) 1 MG tablet Take 1 mg by mouth every 6 (six) hours as needed (max 3.5). for anxiety  2  . busPIRone (BUSPAR) 30 MG tablet Take 30 mg by mouth 2 (two) times daily.  2  . cetirizine-pseudoephedrine (ZYRTEC-D) 5-120 MG tablet Take 1 tablet by mouth daily. 15 tablet 0  . cholestyramine (QUESTRAN) 4 g packet Take 1 packet (4 g total) by mouth 2 (two) times daily. 60 each 12  . dexamethasone (DECADRON) 4 MG tablet Take 2 tablets (8 mg total) by mouth 2 (two) times daily. Start the day before Taxotere. Take once the day after, then 2 times a day x 2d. 30 tablet 1  . diphenoxylate-atropine (LOMOTIL) 2.5-0.025 MG tablet Take 1 tablet by mouth 4 (four) times daily as needed for diarrhea or loose stools. 30 tablet 0  . DULoxetine (CYMBALTA) 60 MG capsule TAKE 2 CAPSULES BY MOUTH DAILY IN THE MORNING.  5  . fluticasone (FLONASE) 50 MCG/ACT nasal spray Place 2 sprays  into both nostrils daily. 1 g 0  . Fluticasone-Salmeterol (ADVAIR DISKUS) 250-50 MCG/DOSE AEPB INHALE 1 PUFF INTO THE LUNGS 2 TIMES DAILY. 60 each 2  . lidocaine-prilocaine (EMLA) cream Apply to affected area once 30 g 3  . loperamide (IMODIUM) 2 MG capsule Take 2 mg by mouth as needed for diarrhea or loose stools.    Marland Kitchen LORazepam (ATIVAN) 0.5 MG tablet Take 1 tablet (0.5 mg total) by mouth at bedtime as needed (Nausea or vomiting). 30 tablet 0  . naproxen (NAPROSYN) 500 MG tablet Take 1 tablet (500 mg total) by mouth 2 (two) times daily with a meal. 60 tablet 3  . omeprazole (PRILOSEC) 40 MG capsule Take 1 capsule (40 mg total) by mouth daily. 30 capsule 2  . ondansetron (ZOFRAN) 8 MG tablet Take 1 tablet (8 mg total) by mouth every 8 (eight) hours as needed for nausea or vomiting. Do not take the first  three days after chemo 20 tablet 0  . prochlorperazine (COMPAZINE) 10 MG tablet Take 1 tablet (10 mg total) by mouth every 6 (six) hours as needed (Nausea or vomiting). 30 tablet 1  . propranolol (INDERAL) 20 MG tablet Take 20 mg by mouth 3 (three) times daily.    Marland Kitchen venlafaxine (EFFEXOR) 75 MG tablet Take 1 tablet (75 mg total) by mouth 2 (two) times daily. 60 tablet 3  . zolpidem (AMBIEN) 10 MG tablet Take 10 mg by mouth at bedtime as needed for sleep (1/2-1 tab).      No current facility-administered medications for this visit.     OBJECTIVE:  Vitals:   06/21/18 1107  BP: (!) 123/59  Pulse: 77  Resp: 18  Temp: 98.5 F (36.9 C)  SpO2: 98%     Body mass index is 36.61 kg/m.   Wt Readings from Last 3 Encounters:  06/21/18 213 lb 4.8 oz (96.8 kg)  06/15/18 206 lb (93.4 kg)  06/07/18 202 lb 6.4 oz (91.8 kg)  ECOG FS:1 - Symptomatic but completely ambulatory GENERAL: Patient is a well appearing female in no acute distress HEENT:  Sclerae anicteric.  Oropharynx clear and moist. No ulcerations or evidence of oropharyngeal candidiasis. Neck is supple.  NODES:  No cervical, supraclavicular,  or axillary lymphadenopathy palpated.  BREAST EXAM:  Unable to palpate definite mass in right breast LUNGS:  Clear to auscultation bilaterally.  No wheezes or rhonchi. HEART:  Regular rate and rhythm. No murmur appreciated. ABDOMEN:  Soft, nontender.  Positive, normoactive bowel sounds. No organomegaly palpated. MSK:  No focal spinal tenderness to palpation. Full range of motion bilaterally in the upper extremities. EXTREMITIES:  No peripheral edema.   SKIN:  Clear with no obvious rashes or skin changes. No nail dyscrasia. NEURO:  Nonfocal. Well oriented.  Appropriate affect.    LAB RESULTS:  CMP     Component Value Date/Time   NA 138 06/07/2018 0911   K 3.1 (L) 06/07/2018 0911   CL 105 06/07/2018 0911   CO2 24 06/07/2018 0911   GLUCOSE 87 06/07/2018 0911   BUN 24 (H) 06/07/2018 0911   CREATININE 0.79 06/07/2018 0911   CREATININE 0.80 03/03/2018 0830   CREATININE 0.83 09/24/2016 1151   CALCIUM 8.8 (L) 06/07/2018 0911   PROT 6.9 06/07/2018 0911   ALBUMIN 3.9 06/07/2018 0911   AST 22 06/07/2018 0911   AST 15 03/03/2018 0830   ALT 39 06/07/2018 0911   ALT 16 03/03/2018 0830   ALKPHOS 109 06/07/2018 0911   BILITOT 0.7 06/07/2018 0911   BILITOT 0.5 03/03/2018 0830   GFRNONAA >60 06/07/2018 0911   GFRNONAA >60 03/03/2018 0830   GFRNONAA 79 09/24/2016 1151   GFRAA >60 06/07/2018 0911   GFRAA >60 03/03/2018 0830   GFRAA >89 09/24/2016 1151    No results found for: TOTALPROTELP, ALBUMINELP, A1GS, A2GS, BETS, BETA2SER, GAMS, MSPIKE, SPEI  No results found for: KPAFRELGTCHN, LAMBDASER, KAPLAMBRATIO  Lab Results  Component Value Date   WBC 4.8 06/21/2018   NEUTROABS 2.9 06/21/2018   HGB 7.3 (L) 06/21/2018   HCT 23.2 (L) 06/21/2018   MCV 97.5 06/21/2018   PLT 374 06/21/2018    @LASTCHEMISTRY @  No results found for: LABCA2  No components found for: LOVFIE332  No results for input(s): INR in the last 168 hours.  No results found for: LABCA2  No results found for:  RJJ884  No results found for: ZYS063  No results found for: KZS010  No results found  for: CA2729  No components found for: HGQUANT  No results found for: CEA1 / No results found for: CEA1   No results found for: AFPTUMOR  No results found for: [place]  No results found for: PSA1  Appointment on 06/21/2018  Component Date Value Ref Range Status  . WBC 06/21/2018 4.8  4.0 - 10.5 K/uL Final  . RBC 06/21/2018 2.38* 3.87 - 5.11 MIL/uL Final  . Hemoglobin 06/21/2018 7.3* 12.0 - 15.0 g/dL Final  . HCT 06/21/2018 23.2* 36.0 - 46.0 % Final  . MCV 06/21/2018 97.5  80.0 - 100.0 fL Final  . MCH 06/21/2018 30.7  26.0 - 34.0 pg Final  . MCHC 06/21/2018 31.5  30.0 - 36.0 g/dL Final  . RDW 06/21/2018 19.1* 11.5 - 15.5 % Final  . Platelets 06/21/2018 374  150 - 400 K/uL Final  . nRBC 06/21/2018 0.4* 0.0 - 0.2 % Final  . Neutrophils Relative % 06/21/2018 59  % Final  . Neutro Abs 06/21/2018 2.9  1.7 - 7.7 K/uL Final  . Lymphocytes Relative 06/21/2018 24  % Final  . Lymphs Abs 06/21/2018 1.2  0.7 - 4.0 K/uL Final  . Monocytes Relative 06/21/2018 15  % Final  . Monocytes Absolute 06/21/2018 0.7  0.1 - 1.0 K/uL Final  . Eosinophils Relative 06/21/2018 1  % Final  . Eosinophils Absolute 06/21/2018 0.0  0.0 - 0.5 K/uL Final  . Basophils Relative 06/21/2018 0  % Final  . Basophils Absolute 06/21/2018 0.0  0.0 - 0.1 K/uL Final  . Immature Granulocytes 06/21/2018 1  % Final  . Abs Immature Granulocytes 06/21/2018 0.03  0.00 - 0.07 K/uL Final   Performed at [facility], [address] [place], [place] [address]    (this displays the last labs from the last 3 days)  No results found for: TOTALPROTELP, ALBUMINELP, A1GS, A2GS, BETS, BETA2SER, GAMS, MSPIKE, SPEI (this displays SPEP labs)  No results found for: KPAFRELGTCHN, LAMBDASER, KAPLAMBRATIO (kappa/lambda light chains)  No results found for: HGBA, HGBA2QUANT, HGBFQUANT, HGBSQUAN (Hemoglobinopathy evaluation)     No results found for: LDH  No results found for: IRON, TIBC, IRONPCTSAT (Iron and TIBC)  No results found for: FERRITIN  Urinalysis    Component Value Date/Time   COLORURINE CANCELED 09/24/2016 1151   APPEARANCEUR CANCELED 09/24/2016 1151   LABSPEC CANCELED 09/24/2016 1151   PHURINE CANCELED 09/24/2016 1151   GLUCOSEU CANCELED 09/24/2016 1151   HGBUR CANCELED 09/24/2016 1151   BILIRUBINUR CANCELED 09/24/2016 1151   KETONESUR CANCELED 09/24/2016 1151   PROTEINUR CANCELED 09/24/2016 1151   NITRITE CANCELED 09/24/2016 1151   LEUKOCYTESUR CANCELED 09/24/2016 1151     STUDIES:  No results found.  ELIGIBLE FOR AVAILABLE RESEARCH PROTOCOL:BCEP  ASSESSMENT: 57 y.o.  woman status post right breast upper outer quadrant biopsy 02/25/2018 for a clinical T2 pN1, stage IIB invasive ductal carcinoma, estrogen and progesterone receptor negative, HER-2 amplified, with an MIB-1 of 50%.  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  (4) definitive surgery with targeted axillary dissection to follow  (5) adjuvant radiation  (6) left adrenal adenoma measuring 2.9 cm on CT of the chest 03/16/2018  PLAN:  Kaneshia is feeling poorly today and is very tearful. I reviewed this with Dr. Jana Hakim who also came into the room to see her.  Her WBC is normal and she responded well  to the granix.  Dr. Jana Hakim suggested she perhaps receive two injections of granix instead of four.    Dr. Jana Hakim also recommended she receive two units of blood, with one of these units being done today and hopefully in addition to her treatment.  He recommended that she receive one unit as priority, then hopefully her neoadjuvant chemotherapy (cycle 5 of Gemcitabine, carbo, Trastuzumab), and then the second unit tomorrow or Wednesday.    We will have to adjust her schedule based on the treatment room availability.  Sharee is clear that she would like to receive everything this week instead of taking a break, so that she can go ahead and finish with her chemo.  We have sent the appropriate scheduling requests.     Wilber Bihari, NP  06/21/18 11:23 AM Medical Oncology and Hematology [facility] [address] [place], [place] [address] Tel. [phone]    Fax. [phone]  ADDENDUM: Called by infusion RN, and requested to give something for patient anxiety.  IV Lorazepam 0.31m ordered.  I called to recheck on patient and she is much improved per NRogue Jury RN.      ADDENDUM: It has been difficult to get Unknown through her chemo, but she remains very motivated and though I gave her the option of waiting 1 week she really wanted to proceed with treatment today and I saw no reason not to.  We will give her some blood before the treatment and then she will receive a second unit on the day of her Neupogen.  She tolerated those shots very poorly she says but we should be able to get away with at least 2 of them.  She no longer has a palpable mass.  I am hopeful we will get good news when we obtain her restaging MRI of the breast at the completion of chemotherapy.  She has an echocardiogram pending later this week  I personally saw this patient and performed a substantive portion of this encounter with the listed APP documented above.   GChauncey Cruel  MD Medical Oncology and Hematology [facility] [id number]. [phone]   Fax. [phone]

## 2018-06-21 ENCOUNTER — Other Ambulatory Visit: Payer: Self-pay | Admitting: *Deleted

## 2018-06-21 ENCOUNTER — Inpatient Hospital Stay: Payer: Medicaid Other

## 2018-06-21 ENCOUNTER — Telehealth: Payer: Self-pay | Admitting: Adult Health

## 2018-06-21 ENCOUNTER — Inpatient Hospital Stay: Payer: Medicaid Other | Admitting: Lab

## 2018-06-21 ENCOUNTER — Encounter: Payer: Self-pay | Admitting: Adult Health

## 2018-06-21 ENCOUNTER — Inpatient Hospital Stay (HOSPITAL_BASED_OUTPATIENT_CLINIC_OR_DEPARTMENT_OTHER): Payer: Medicaid Other | Admitting: Adult Health

## 2018-06-21 ENCOUNTER — Inpatient Hospital Stay: Payer: Medicaid Other | Attending: Oncology

## 2018-06-21 VITALS — BP 104/59 | HR 69 | Temp 98.4°F | Resp 17

## 2018-06-21 VITALS — BP 123/59 | HR 77 | Temp 98.5°F | Resp 18 | Ht 64.0 in | Wt 213.3 lb

## 2018-06-21 DIAGNOSIS — Z95828 Presence of other vascular implants and grafts: Secondary | ICD-10-CM | POA: Insufficient documentation

## 2018-06-21 DIAGNOSIS — F411 Generalized anxiety disorder: Secondary | ICD-10-CM | POA: Insufficient documentation

## 2018-06-21 DIAGNOSIS — C50411 Malignant neoplasm of upper-outer quadrant of right female breast: Secondary | ICD-10-CM | POA: Diagnosis present

## 2018-06-21 DIAGNOSIS — D649 Anemia, unspecified: Secondary | ICD-10-CM

## 2018-06-21 DIAGNOSIS — J441 Chronic obstructive pulmonary disease with (acute) exacerbation: Secondary | ICD-10-CM

## 2018-06-21 DIAGNOSIS — R197 Diarrhea, unspecified: Secondary | ICD-10-CM

## 2018-06-21 DIAGNOSIS — Z5112 Encounter for antineoplastic immunotherapy: Secondary | ICD-10-CM | POA: Insufficient documentation

## 2018-06-21 DIAGNOSIS — Z5111 Encounter for antineoplastic chemotherapy: Secondary | ICD-10-CM | POA: Insufficient documentation

## 2018-06-21 DIAGNOSIS — Z5189 Encounter for other specified aftercare: Secondary | ICD-10-CM | POA: Insufficient documentation

## 2018-06-21 DIAGNOSIS — Z171 Estrogen receptor negative status [ER-]: Secondary | ICD-10-CM

## 2018-06-21 DIAGNOSIS — Z452 Encounter for adjustment and management of vascular access device: Secondary | ICD-10-CM | POA: Diagnosis not present

## 2018-06-21 DIAGNOSIS — J45901 Unspecified asthma with (acute) exacerbation: Secondary | ICD-10-CM

## 2018-06-21 LAB — COMPREHENSIVE METABOLIC PANEL
ALK PHOS: 112 U/L (ref 38–126)
ALT: 37 U/L (ref 0–44)
ANION GAP: 9 (ref 5–15)
AST: 28 U/L (ref 15–41)
Albumin: 3.7 g/dL (ref 3.5–5.0)
BILIRUBIN TOTAL: 0.3 mg/dL (ref 0.3–1.2)
BUN: 19 mg/dL (ref 6–20)
CALCIUM: 9 mg/dL (ref 8.9–10.3)
CO2: 24 mmol/L (ref 22–32)
CREATININE: 0.8 mg/dL (ref 0.44–1.00)
Chloride: 110 mmol/L (ref 98–111)
GFR calc Af Amer: >60 mL/min (ref >60)
GFR calc non Af Amer: >60 mL/min (ref >60)
Glucose, Bld: 89 mg/dL (ref 70–99)
POTASSIUM: 3.6 mmol/L (ref 3.5–5.1)
SODIUM: 143 mmol/L (ref 135–145)
Total Protein: 6.8 g/dL (ref 6.5–8.1)

## 2018-06-21 LAB — CBC WITH DIFFERENTIAL/PLATELET
Abs Immature Granulocytes: 0.03 10*3/uL (ref 0.00–0.07)
BASOS ABS: 0 10*3/uL (ref 0.0–0.1)
Basophils Relative: 0 %
EOS ABS: 0 10*3/uL (ref 0.0–0.5)
EOS PCT: 1 %
HEMATOCRIT: 23.2 % — AB (ref 36.0–46.0)
Hemoglobin: 7.3 g/dL — ABNORMAL LOW (ref 12.0–15.0)
Immature Granulocytes: 1 %
LYMPHS ABS: 1.2 10*3/uL (ref 0.7–4.0)
Lymphocytes Relative: 24 %
MCH: 30.7 pg (ref 26.0–34.0)
MCHC: 31.5 g/dL (ref 30.0–36.0)
MCV: 97.5 fL (ref 80.0–100.0)
MONO ABS: 0.7 10*3/uL (ref 0.1–1.0)
Monocytes Relative: 15 %
NRBC: 0.4 % — AB (ref 0.0–0.2)
Neutro Abs: 2.9 10*3/uL (ref 1.7–7.7)
Neutrophils Relative %: 59 %
Platelets: 374 10*3/uL (ref 150–400)
RBC: 2.38 MIL/uL — ABNORMAL LOW (ref 3.87–5.11)
RDW: 19.1 % — AB (ref 11.5–15.5)
WBC: 4.8 10*3/uL (ref 4.0–10.5)

## 2018-06-21 LAB — PREPARE RBC (CROSSMATCH)

## 2018-06-21 LAB — ABO/RH: ABO/RH(D): O POS

## 2018-06-21 MED ORDER — ALTEPLASE 2 MG IJ SOLR
INTRAMUSCULAR | Status: AC
  Filled 2018-06-21: qty 2

## 2018-06-21 MED ORDER — SODIUM CHLORIDE 0.9% FLUSH
10.0000 mL | INTRAVENOUS | Status: DC | PRN
  Filled 2018-06-21: qty 10

## 2018-06-21 MED ORDER — HEPARIN SOD (PORK) LOCK FLUSH 100 UNIT/ML IV SOLN
500.0000 [IU] | Freq: Once | INTRAVENOUS | Status: AC
  Administered 2018-06-21: 500 [IU]
  Filled 2018-06-21: qty 5

## 2018-06-21 MED ORDER — SODIUM CHLORIDE 0.9% FLUSH
10.0000 mL | Freq: Once | INTRAVENOUS | Status: AC
  Administered 2018-06-21: 10 mL
  Filled 2018-06-21: qty 10

## 2018-06-21 MED ORDER — ACETAMINOPHEN 325 MG PO TABS
650.0000 mg | ORAL_TABLET | Freq: Once | ORAL | Status: AC
  Administered 2018-06-21: 650 mg via ORAL

## 2018-06-21 MED ORDER — ALTEPLASE 2 MG IJ SOLR
2.0000 mg | Freq: Once | INTRAMUSCULAR | Status: AC
  Administered 2018-06-21: 2 mg
  Filled 2018-06-21: qty 2

## 2018-06-21 MED ORDER — LORAZEPAM 2 MG/ML IJ SOLN
INTRAMUSCULAR | Status: AC
  Filled 2018-06-21: qty 1

## 2018-06-21 MED ORDER — LORAZEPAM 2 MG/ML IJ SOLN
0.5000 mg | Freq: Once | INTRAMUSCULAR | Status: AC
  Administered 2018-06-21: 0.5 mg via INTRAVENOUS

## 2018-06-21 MED ORDER — ACETAMINOPHEN 325 MG PO TABS
ORAL_TABLET | ORAL | Status: AC
  Filled 2018-06-21: qty 2

## 2018-06-21 MED ORDER — SODIUM CHLORIDE 0.9% IV SOLUTION
250.0000 mL | Freq: Once | INTRAVENOUS | Status: AC
  Administered 2018-06-21: 250 mL via INTRAVENOUS
  Filled 2018-06-21: qty 250

## 2018-06-21 MED ORDER — DIPHENHYDRAMINE HCL 25 MG PO CAPS
ORAL_CAPSULE | ORAL | Status: AC
  Filled 2018-06-21: qty 2

## 2018-06-21 MED ORDER — ALPRAZOLAM 0.5 MG PO TABS
ORAL_TABLET | ORAL | Status: AC
  Filled 2018-06-21: qty 2

## 2018-06-21 MED ORDER — HEPARIN SOD (PORK) LOCK FLUSH 100 UNIT/ML IV SOLN
250.0000 [IU] | INTRAVENOUS | Status: DC | PRN
  Filled 2018-06-21: qty 5

## 2018-06-21 MED ORDER — DIPHENHYDRAMINE HCL 25 MG PO CAPS
25.0000 mg | ORAL_CAPSULE | Freq: Once | ORAL | Status: AC
  Administered 2018-06-21: 25 mg via ORAL

## 2018-06-21 NOTE — Telephone Encounter (Signed)
Pt sched per 11/11 sch message and provider request for blood and treatment

## 2018-06-21 NOTE — Patient Instructions (Signed)

## 2018-06-21 NOTE — Progress Notes (Signed)
Pt started having a panic attack - rapid breathing, crying.  Pt given 0.5mg  IV Ativan.  VSS.  Pt now reclining in chair.   Currently breathing has slowed slightly, less tearful and stable.  Pt's mother at chairside.

## 2018-06-21 NOTE — Patient Instructions (Signed)
Blood Transfusion, Adult A blood transfusion is a procedure in which you receive donated blood, including plasma, platelets, and red blood cells, through an IV tube. You may need a blood transfusion because of illness, surgery, or injury. The blood may come from a donor. You may also be able to donate blood for yourself (autologous blood donation) before a surgery if you know that you might require a blood transfusion. The blood given in a transfusion is made up of different types of cells. You may receive:  Red blood cells. These carry oxygen to the cells in the body.  White blood cells. These help you fight infections.  Platelets. These help your blood to clot.  Plasma. This is the liquid part of your blood and it helps with fluid imbalances.  If you have hemophilia or another clotting disorder, you may also receive other types of blood products. Tell a health care provider about:  Any allergies you have.  All medicines you are taking, including vitamins, herbs, eye drops, creams, and over-the-counter medicines.  Any problems you or family members have had with anesthetic medicines.  Any blood disorders you have.  Any surgeries you have had.  Any medical conditions you have, including any recent fever or cold symptoms.  Whether you are pregnant or may be pregnant.  Any previous reactions you have had during a blood transfusion. What are the risks? Generally, this is a safe procedure. However, problems may occur, including:  Having an allergic reaction to something in the donated blood. Hives and itching may be symptoms of this type of reaction.  Fever. This may be a reaction to the white blood cells in the transfused blood. Nausea or chest pain may accompany a fever.  Iron overload. This can happen from having many transfusions.  Transfusion-related acute lung injury (TRALI). This is a rare reaction that causes lung damage. The cause is not known.TRALI can occur within hours  of a transfusion or several days later.  Sudden (acute) or delayed hemolytic reactions. This happens if your blood does not match the cells in your transfusion. Your body's defense system (immune system) may try to attack the new cells. This complication is rare. The symptoms include fever, chills, nausea, and low back pain or chest pain.  Infection or disease transmission. This is rare.  What happens before the procedure?  You will have a blood test to determine your blood type. This is necessary to know what kind of blood your body will accept and to match it to the donor blood.  If you are going to have a planned surgery, you may be able to do an autologous blood donation. This may be done in case you need to have a transfusion.  If you have had an allergic reaction to a transfusion in the past, you may be given medicine to help prevent a reaction. This medicine may be given to you by mouth or through an IV tube.  You will have your temperature, blood pressure, and pulse monitored before the transfusion.  Follow instructions from your health care provider about eating and drinking restrictions.  Ask your health care provider about: ? Changing or stopping your regular medicines. This is especially important if you are taking diabetes medicines or blood thinners. ? Taking medicines such as aspirin and ibuprofen. These medicines can thin your blood. Do not take these medicines before your procedure if your health care provider instructs you not to. What happens during the procedure?  An IV tube will  be inserted into one of your veins.  The bag of donated blood will be attached to your IV tube. The blood will then enter through your vein.  Your temperature, blood pressure, and pulse will be monitored regularly during the transfusion. This monitoring is done to detect early signs of a transfusion reaction.  If you have any signs or symptoms of a reaction, your transfusion will be stopped  and you may be given medicine.  When the transfusion is complete, your IV tube will be removed.  Pressure may be applied to the IV site for a few minutes.  A bandage (dressing) will be applied. The procedure may vary among health care providers and hospitals. What happens after the procedure?  Your temperature, blood pressure, heart rate, breathing rate, and blood oxygen level will be monitored often.  Your blood may be tested to see how you are responding to the transfusion.  You may be warmed with fluids or blankets to maintain a normal body temperature. Summary  A blood transfusion is a procedure in which you receive donated blood, including plasma, platelets, and red blood cells, through an IV tube.  Your temperature, blood pressure, and pulse will be monitored before, during, and after the transfusion.  Your blood may be tested after the transfusion to see how your body has responded. This information is not intended to replace advice given to you by your health care provider. Make sure you discuss any questions you have with your health care provider. Document Released: 07/25/2000 Document Revised: 04/24/2016 Document Reviewed: 04/24/2016 Elsevier Interactive Patient Education  2018 [employer]. Anemia Anemia is a condition in which you do not have enough red blood cells or hemoglobin. Hemoglobin is a substance in red blood cells that carries oxygen. When you do not have enough red blood cells or hemoglobin (are anemic), your body cannot get enough oxygen and your organs may not work properly. As a result, you may feel very tired or have other problems. What are the causes? Common causes of anemia include:  Excessive bleeding. Anemia can be caused by excessive bleeding inside or outside the body, including bleeding from the intestine or from periods in women.  Poor nutrition.  Long-lasting (chronic) kidney, thyroid, and liver disease.  Bone marrow disorders.  Cancer and  treatments for cancer.  HIV (human immunodeficiency virus) and AIDS (acquired immunodeficiency syndrome).  Treatments for HIV and AIDS.  Spleen problems.  Blood disorders.  Infections, medicines, and autoimmune disorders that destroy red blood cells.  What are the signs or symptoms? Symptoms of this condition include:  Minor weakness.  Dizziness.  Headache.  Feeling heartbeats that are irregular or faster than normal (palpitations).  Shortness of breath, especially with exercise.  Paleness.  Cold sensitivity.  Indigestion.  Nausea.  Difficulty sleeping.  Difficulty concentrating.  Symptoms may occur suddenly or develop slowly. If your anemia is mild, you may not have symptoms. How is this diagnosed? This condition is diagnosed based on:  Blood tests.  Your medical history.  A physical exam.  Bone marrow biopsy.  Your health care provider may also check your stool (feces) for blood and may do additional testing to look for the cause of your bleeding. You may also have other tests, including:  Imaging tests, such as a CT scan or MRI.  Endoscopy.  Colonoscopy.  How is this treated? Treatment for this condition depends on the cause. If you continue to lose a lot of blood, you may need to be treated at  a hospital. Treatment may include:  Taking supplements of iron, vitamin R44, or folic acid.  Taking a hormone medicine (erythropoietin) that can help to stimulate red blood cell growth.  Having a blood transfusion. This may be needed if you lose a lot of blood.  Making changes to your diet.  Having surgery to remove your spleen.  Follow these instructions at home:  Take over-the-counter and prescription medicines only as told by your health care provider.  Take supplements only as told by your health care provider.  Follow any diet instructions that you were given.  Keep all follow-up visits as told by your health care provider. This is  important. Contact a health care provider if:  You develop new bleeding anywhere in the body. Get help right away if:  You are very weak.  You are short of breath.  You have pain in your abdomen or chest.  You are dizzy or feel faint.  You have trouble concentrating.  You have bloody or black, tarry stools.  You vomit repeatedly or you vomit up blood. Summary  Anemia is a condition in which you do not have enough red blood cells or enough of a substance in your red blood cells that carries oxygen (hemoglobin).  Symptoms may occur suddenly or develop slowly.  If your anemia is mild, you may not have symptoms.  This condition is diagnosed with blood tests as well as a medical history and physical exam. Other tests may be needed.  Treatment for this condition depends on the cause of the anemia. This information is not intended to replace advice given to you by your health care provider. Make sure you discuss any questions you have with your health care provider. Document Released: 09/04/2004 Document Revised: 08/29/2016 Document Reviewed: 08/29/2016 Elsevier Interactive Patient Education  [employer].

## 2018-06-21 NOTE — Telephone Encounter (Signed)
No 11/11 los.

## 2018-06-23 ENCOUNTER — Ambulatory Visit: Payer: Medicaid Other

## 2018-06-23 ENCOUNTER — Inpatient Hospital Stay: Payer: Medicaid Other

## 2018-06-23 ENCOUNTER — Other Ambulatory Visit: Payer: Self-pay | Admitting: *Deleted

## 2018-06-24 ENCOUNTER — Ambulatory Visit: Payer: Medicaid Other

## 2018-06-24 ENCOUNTER — Other Ambulatory Visit: Payer: Self-pay | Admitting: *Deleted

## 2018-06-24 ENCOUNTER — Inpatient Hospital Stay: Payer: Medicaid Other

## 2018-06-24 VITALS — BP 108/46 | HR 66 | Temp 98.5°F | Resp 16

## 2018-06-24 DIAGNOSIS — Z171 Estrogen receptor negative status [ER-]: Secondary | ICD-10-CM

## 2018-06-24 DIAGNOSIS — C50411 Malignant neoplasm of upper-outer quadrant of right female breast: Secondary | ICD-10-CM

## 2018-06-24 DIAGNOSIS — Z5111 Encounter for antineoplastic chemotherapy: Secondary | ICD-10-CM | POA: Diagnosis not present

## 2018-06-24 DIAGNOSIS — D649 Anemia, unspecified: Secondary | ICD-10-CM

## 2018-06-24 MED ORDER — SODIUM CHLORIDE 0.9 % IV SOLN
Freq: Once | INTRAVENOUS | Status: AC
  Administered 2018-06-24: 09:00:00 via INTRAVENOUS
  Filled 2018-06-24: qty 5

## 2018-06-24 MED ORDER — PALONOSETRON HCL INJECTION 0.25 MG/5ML
INTRAVENOUS | Status: AC
  Filled 2018-06-24: qty 5

## 2018-06-24 MED ORDER — SODIUM CHLORIDE 0.9% FLUSH
10.0000 mL | INTRAVENOUS | Status: DC | PRN
  Administered 2018-06-24: 10 mL
  Filled 2018-06-24: qty 10

## 2018-06-24 MED ORDER — SODIUM CHLORIDE 0.9 % IV SOLN
Freq: Once | INTRAVENOUS | Status: AC
  Administered 2018-06-24: 08:00:00 via INTRAVENOUS
  Filled 2018-06-24: qty 250

## 2018-06-24 MED ORDER — HEPARIN SOD (PORK) LOCK FLUSH 100 UNIT/ML IV SOLN
500.0000 [IU] | Freq: Once | INTRAVENOUS | Status: AC | PRN
  Administered 2018-06-24: 500 [IU]
  Filled 2018-06-24: qty 5

## 2018-06-24 MED ORDER — PALONOSETRON HCL INJECTION 0.25 MG/5ML
0.2500 mg | Freq: Once | INTRAVENOUS | Status: AC
  Administered 2018-06-24: 0.25 mg via INTRAVENOUS

## 2018-06-24 MED ORDER — DIPHENHYDRAMINE HCL 25 MG PO CAPS
ORAL_CAPSULE | ORAL | Status: AC
  Filled 2018-06-24: qty 1

## 2018-06-24 MED ORDER — TRASTUZUMAB CHEMO 150 MG IV SOLR
600.0000 mg | Freq: Once | INTRAVENOUS | Status: AC
  Administered 2018-06-24: 600 mg via INTRAVENOUS
  Filled 2018-06-24: qty 28.57

## 2018-06-24 MED ORDER — DIPHENHYDRAMINE HCL 25 MG PO CAPS
25.0000 mg | ORAL_CAPSULE | Freq: Once | ORAL | Status: AC
  Administered 2018-06-24: 25 mg via ORAL

## 2018-06-24 MED ORDER — ACETAMINOPHEN 325 MG PO TABS
ORAL_TABLET | ORAL | Status: AC
  Filled 2018-06-24: qty 2

## 2018-06-24 MED ORDER — SODIUM CHLORIDE 0.9 % IV SOLN
800.0000 mg/m2 | Freq: Once | INTRAVENOUS | Status: AC
  Administered 2018-06-24: 1672 mg via INTRAVENOUS
  Filled 2018-06-24: qty 43.97

## 2018-06-24 MED ORDER — ACETAMINOPHEN 325 MG PO TABS
650.0000 mg | ORAL_TABLET | Freq: Once | ORAL | Status: AC
  Administered 2018-06-24: 650 mg via ORAL

## 2018-06-24 MED ORDER — SODIUM CHLORIDE 0.9 % IV SOLN
713.5000 mg | Freq: Once | INTRAVENOUS | Status: AC
  Administered 2018-06-24: 710 mg via INTRAVENOUS
  Filled 2018-06-24: qty 71

## 2018-06-24 NOTE — Patient Instructions (Signed)
[facility] Discharge Instructions for Patients Receiving Chemotherapy  Today you received the following chemotherapy agents: Herceptin, Gemzar, and Carboplatin.   To help prevent nausea and vomiting after your treatment, we encourage you to take your nausea medication as directed. If you develop nausea and vomiting that is not controlled by your nausea medication, call the clinic.   BELOW ARE SYMPTOMS THAT SHOULD BE REPORTED IMMEDIATELY:  *FEVER GREATER THAN 100.5 F  *CHILLS WITH OR WITHOUT FEVER  NAUSEA AND VOMITING THAT IS NOT CONTROLLED WITH YOUR NAUSEA MEDICATION  *UNUSUAL SHORTNESS OF BREATH  *UNUSUAL BRUISING OR BLEEDING  TENDERNESS IN MOUTH AND THROAT WITH OR WITHOUT PRESENCE OF ULCERS  *URINARY PROBLEMS  *BOWEL PROBLEMS  UNUSUAL RASH Items with * indicate a potential emergency and should be followed up as soon as possible.  Feel free to call the clinic should you have any questions or concerns. The clinic phone number is [phone].  Please show the [facility] at check-in to the Emergency Department and triage nurse.                Blood Transfusion, Care After This sheet gives you information about how to care for yourself after your procedure. Your doctor may also give you more specific instructions. If you have problems or questions, contact your doctor. Follow these instructions at home:  Take over-the-counter and prescription medicines only as told by your doctor.  Go back to your normal activities as told by your doctor.  Follow instructions from your doctor about how to take care of the area where an IV tube was put into your vein (insertion site). Make sure you: ? Wash your hands with soap and water before you change your bandage (dressing). If there is no soap and water, use hand sanitizer. ? Change your bandage as told by your doctor.  Check your IV insertion site every day for signs of infection. Check for: ? More  redness, swelling, or pain. ? More fluid or blood. ? Warmth. ? Pus or a bad smell. Contact a doctor if:  You have more redness, swelling, or pain around the IV insertion site..  You have more fluid or blood coming from the IV insertion site.  Your IV insertion site feels warm to the touch.  You have pus or a bad smell coming from the IV insertion site.  Your pee (urine) turns pink, red, or brown.  You feel weak after doing your normal activities. Get help right away if:  You have signs of a serious allergic or body defense (immune) system reaction, including: ? Itchiness. ? Hives. ? Trouble breathing. ? Anxiety. ? Pain in your chest or lower back. ? Fever, flushing, and chills. ? Fast pulse. ? Rash. ? Watery poop (diarrhea). ? Throwing up (vomiting). ? Dark pee. ? Serious headache. ? Dizziness. ? Stiff neck. ? Yellow color in your face or the white parts of your eyes (jaundice). Summary  After a blood transfusion, return to your normal activities as told by your doctor.  Every day, check for signs of infection where the IV tube was put into your vein.  Some signs of infection are warm skin, more redness and pain, more fluid or blood, and pus or a bad smell where the needle went in.  Contact your doctor if you feel weak or have any unusual symptoms. This information is not intended to replace advice given to you by your health care provider. Make sure you discuss any questions  you have with your health care provider. Document Released: 08/18/2014 Document Revised: 03/21/2016 Document Reviewed: 03/21/2016 Elsevier Interactive Patient Education  2017 [employer].

## 2018-06-25 ENCOUNTER — Telehealth: Payer: Self-pay

## 2018-06-25 ENCOUNTER — Ambulatory Visit (HOSPITAL_COMMUNITY)
Admission: RE | Admit: 2018-06-25 | Discharge: 2018-06-25 | Disposition: A | Discharge status: Home / Self Care | Payer: Medicaid Other | Source: Ambulatory Visit | Attending: Adult Health | Admitting: Adult Health

## 2018-06-25 ENCOUNTER — Ambulatory Visit: Payer: Medicaid Other

## 2018-06-25 DIAGNOSIS — E119 Type 2 diabetes mellitus without complications: Secondary | ICD-10-CM | POA: Insufficient documentation

## 2018-06-25 DIAGNOSIS — C50411 Malignant neoplasm of upper-outer quadrant of right female breast: Secondary | ICD-10-CM | POA: Insufficient documentation

## 2018-06-25 DIAGNOSIS — I10 Essential (primary) hypertension: Secondary | ICD-10-CM | POA: Diagnosis not present

## 2018-06-25 DIAGNOSIS — Z171 Estrogen receptor negative status [ER-]: Secondary | ICD-10-CM | POA: Diagnosis not present

## 2018-06-25 LAB — BPAM RBC
BLOOD PRODUCT EXPIRATION DATE: 201912052359
BLOOD PRODUCT EXPIRATION DATE: 201912052359
ISSUE DATE / TIME: 201911140902
ISSUE DATE / TIME: 201911111454
UNIT TYPE AND RH: 5100
Unit Type and Rh: 5100

## 2018-06-25 LAB — TYPE AND SCREEN
ABO/RH(D): O POS
ANTIBODY SCREEN: NEGATIVE
UNIT DIVISION: 0
UNIT DIVISION: 0

## 2018-06-25 NOTE — Telephone Encounter (Signed)
LVM for patient to call back in regards to scheduling a lab and appointment with NP next week.  Availability on 11/20 or 11/22.

## 2018-06-25 NOTE — Progress Notes (Signed)
  Echocardiogram 2D Echocardiogram has been performed.  Jennette Dubin 06/25/2018, 2:04 PM

## 2018-06-26 ENCOUNTER — Other Ambulatory Visit: Payer: Self-pay | Admitting: Oncology

## 2018-06-26 ENCOUNTER — Ambulatory Visit: Payer: Medicaid Other

## 2018-06-26 ENCOUNTER — Inpatient Hospital Stay: Payer: Medicaid Other

## 2018-06-26 VITALS — BP 98/70 | HR 68 | Temp 98.2°F | Resp 18

## 2018-06-26 DIAGNOSIS — Z5111 Encounter for antineoplastic chemotherapy: Secondary | ICD-10-CM | POA: Diagnosis not present

## 2018-06-26 DIAGNOSIS — Z95828 Presence of other vascular implants and grafts: Secondary | ICD-10-CM

## 2018-06-26 DIAGNOSIS — Z171 Estrogen receptor negative status [ER-]: Secondary | ICD-10-CM

## 2018-06-26 DIAGNOSIS — C50411 Malignant neoplasm of upper-outer quadrant of right female breast: Secondary | ICD-10-CM

## 2018-06-26 MED ORDER — PEGFILGRASTIM INJECTION 6 MG/0.6ML ~~LOC~~
PREFILLED_SYRINGE | SUBCUTANEOUS | Status: AC
  Filled 2018-06-26: qty 0.6

## 2018-06-26 MED ORDER — TBO-FILGRASTIM 480 MCG/0.8ML ~~LOC~~ SOSY: 480.0000 ug | PREFILLED_SYRINGE | Freq: Once | SUBCUTANEOUS | Status: DC

## 2018-06-26 MED ORDER — TBO-FILGRASTIM 480 MCG/0.8ML ~~LOC~~ SOSY
PREFILLED_SYRINGE | SUBCUTANEOUS | Status: AC
  Filled 2018-06-26: qty 0.8

## 2018-06-26 MED ORDER — PEGFILGRASTIM INJECTION 6 MG/0.6ML ~~LOC~~
6.0000 mg | PREFILLED_SYRINGE | Freq: Once | SUBCUTANEOUS | Status: AC
  Administered 2018-06-26: 6 mg via SUBCUTANEOUS

## 2018-06-26 NOTE — Progress Notes (Signed)
Pt arrived for injection. S/P Gemzar Carboplatin treatment on 06/24/18. Pt declined granix injection. Reports she did not tolerate it well last cycle. Dr. Jana Hakim notified, order changed.

## 2018-06-26 NOTE — Patient Instructions (Signed)
Pegfilgrastim injection What is this medicine? PEGFILGRASTIM (PEG fil gra stim) is a long-acting granulocyte colony-stimulating factor that stimulates the growth of neutrophils, a type of white blood cell important in the body's fight against infection. It is used to reduce the incidence of fever and infection in patients with certain types of cancer who are receiving chemotherapy that affects the bone marrow, and to increase survival after being exposed to high doses of radiation. This medicine may be used for other purposes; ask your health care provider or pharmacist if you have questions. COMMON BRAND NAME(S): Neulasta What should I tell my health care provider before I take this medicine? They need to know if you have any of these conditions: -kidney disease -latex allergy -ongoing radiation therapy -sickle cell disease -skin reactions to acrylic adhesives (On-Body Injector only) -an unusual or allergic reaction to pegfilgrastim, filgrastim, other medicines, foods, dyes, or preservatives -pregnant or trying to get pregnant -breast-feeding How should I use this medicine? This medicine is for injection under the skin. If you get this medicine at home, you will be taught how to prepare and give the pre-filled syringe or how to use the On-body Injector. Refer to the patient Instructions for Use for detailed instructions. Use exactly as directed. Tell your healthcare provider immediately if you suspect that the On-body Injector may not have performed as intended or if you suspect the use of the On-body Injector resulted in a missed or partial dose. It is important that you put your used needles and syringes in a special sharps container. Do not put them in a trash can. If you do not have a sharps container, call your pharmacist or healthcare provider to get one. Talk to your pediatrician regarding the use of this medicine in children. While this drug may be prescribed for selected conditions,  precautions do apply. Overdosage: If you think you have taken too much of this medicine contact a poison control center or emergency room at once. NOTE: This medicine is only for you. Do not share this medicine with others. What if I miss a dose? It is important not to miss your dose. Call your doctor or health care professional if you miss your dose. If you miss a dose due to an On-body Injector failure or leakage, a new dose should be administered as soon as possible using a single prefilled syringe for manual use. What may interact with this medicine? Interactions have not been studied. Give your health care provider a list of all the medicines, herbs, non-prescription drugs, or dietary supplements you use. Also tell them if you smoke, drink alcohol, or use illegal drugs. Some items may interact with your medicine. This list may not describe all possible interactions. Give your health care provider a list of all the medicines, herbs, non-prescription drugs, or dietary supplements you use. Also tell them if you smoke, drink alcohol, or use illegal drugs. Some items may interact with your medicine. What should I watch for while using this medicine? You may need blood work done while you are taking this medicine. If you are going to need a MRI, CT scan, or other procedure, tell your doctor that you are using this medicine (On-Body Injector only). What side effects may I notice from receiving this medicine? Side effects that you should report to your doctor or health care professional as soon as possible: -allergic reactions like skin rash, itching or hives, swelling of the face, lips, or tongue -dizziness -fever -pain, redness, or irritation at site   where injected -pinpoint red spots on the skin -red or dark-brown urine -shortness of breath or breathing problems -stomach or side pain, or pain at the shoulder -swelling -tiredness -trouble passing urine or change in the amount of urine Side  effects that usually do not require medical attention (report to your doctor or health care professional if they continue or are bothersome): -bone pain -muscle pain This list may not describe all possible side effects. Call your doctor for medical advice about side effects. You may report side effects to FDA at 1-800-FDA-1088. Where should I keep my medicine? Keep out of the reach of children. Store pre-filled syringes in a refrigerator between 2 and 8 degrees C (36 and 46 degrees F). Do not freeze. Keep in carton to protect from light. Throw away this medicine if it is left out of the refrigerator for more than 48 hours. Throw away any unused medicine after the expiration date. NOTE: This sheet is a summary. It may not cover all possible information. If you have questions about this medicine, talk to your doctor, pharmacist, or health care provider.  2018 [employer] (2016-07-24 12:58:03)  

## 2018-06-28 ENCOUNTER — Ambulatory Visit: Payer: Medicaid Other

## 2018-06-29 ENCOUNTER — Ambulatory Visit: Payer: Self-pay | Admitting: Physician Assistant

## 2018-06-29 MED FILL — OMEPRAZOLE 40 MG CPDR: 40 | 30 days supply | Qty: 30 | Fill #1

## 2018-06-30 ENCOUNTER — Encounter: Payer: Self-pay | Admitting: Adult Health

## 2018-06-30 ENCOUNTER — Telehealth: Payer: Self-pay | Admitting: Adult Health

## 2018-06-30 ENCOUNTER — Inpatient Hospital Stay: Payer: Medicaid Other

## 2018-06-30 ENCOUNTER — Inpatient Hospital Stay (HOSPITAL_BASED_OUTPATIENT_CLINIC_OR_DEPARTMENT_OTHER): Payer: Medicaid Other | Admitting: Adult Health

## 2018-06-30 VITALS — BP 135/86 | HR 67 | Temp 98.4°F | Resp 20 | Ht 64.0 in | Wt 206.2 lb

## 2018-06-30 DIAGNOSIS — C50411 Malignant neoplasm of upper-outer quadrant of right female breast: Secondary | ICD-10-CM

## 2018-06-30 DIAGNOSIS — Z87891 Personal history of nicotine dependence: Secondary | ICD-10-CM | POA: Diagnosis not present

## 2018-06-30 DIAGNOSIS — Z5111 Encounter for antineoplastic chemotherapy: Secondary | ICD-10-CM | POA: Diagnosis not present

## 2018-06-30 DIAGNOSIS — Z171 Estrogen receptor negative status [ER-]: Secondary | ICD-10-CM

## 2018-06-30 DIAGNOSIS — J441 Chronic obstructive pulmonary disease with (acute) exacerbation: Secondary | ICD-10-CM

## 2018-06-30 DIAGNOSIS — J45901 Unspecified asthma with (acute) exacerbation: Secondary | ICD-10-CM

## 2018-06-30 LAB — CBC WITH DIFFERENTIAL/PLATELET
Abs Immature Granulocytes: 0.07 10*3/uL (ref 0.00–0.07)
BASOS ABS: 0 10*3/uL (ref 0.0–0.1)
Basophils Relative: 0 %
EOS ABS: 0 10*3/uL (ref 0.0–0.5)
Eosinophils Relative: 0 %
HCT: 32.6 % — ABNORMAL LOW (ref 36.0–46.0)
Hemoglobin: 10.4 g/dL — ABNORMAL LOW (ref 12.0–15.0)
IMMATURE GRANULOCYTES: 1 %
Lymphocytes Relative: 14 %
Lymphs Abs: 0.9 10*3/uL (ref 0.7–4.0)
MCH: 30.7 pg (ref 26.0–34.0)
MCHC: 31.9 g/dL (ref 30.0–36.0)
MCV: 96.2 fL (ref 80.0–100.0)
Monocytes Absolute: 0.1 10*3/uL (ref 0.1–1.0)
Monocytes Relative: 2 %
NEUTROS PCT: 83 %
NRBC: 0 % (ref 0.0–0.2)
Neutro Abs: 5.7 10*3/uL (ref 1.7–7.7)
PLATELETS: 165 10*3/uL (ref 150–400)
RBC: 3.39 MIL/uL — AB (ref 3.87–5.11)
RDW: 16.4 % — AB (ref 11.5–15.5)
WBC: 6.8 10*3/uL (ref 4.0–10.5)

## 2018-06-30 LAB — COMPREHENSIVE METABOLIC PANEL
ALBUMIN: 3.9 g/dL (ref 3.5–5.0)
ALT: 135 U/L — ABNORMAL HIGH (ref 0–44)
AST: 53 U/L — AB (ref 15–41)
Alkaline Phosphatase: 140 U/L — ABNORMAL HIGH (ref 38–126)
Anion gap: 10 (ref 5–15)
BUN: 23 mg/dL — AB (ref 6–20)
CHLORIDE: 108 mmol/L (ref 98–111)
CO2: 23 mmol/L (ref 22–32)
Calcium: 8.8 mg/dL — ABNORMAL LOW (ref 8.9–10.3)
Creatinine, Ser: 0.88 mg/dL (ref 0.44–1.00)
GFR calc Af Amer: >60 mL/min (ref >60)
GLUCOSE: 101 mg/dL — AB (ref 70–99)
POTASSIUM: 3.8 mmol/L (ref 3.5–5.1)
Sodium: 141 mmol/L (ref 135–145)
Total Bilirubin: 0.6 mg/dL (ref 0.3–1.2)
Total Protein: 7.2 g/dL (ref 6.5–8.1)

## 2018-06-30 NOTE — Progress Notes (Signed)
Jacqueline Good  [phone] Fax:([phone]) [phone]     ID: Jacqueline Good DOB: 1960/12/10  MR#: [id number]  CBS#:[id number]  Patient Care Team: Jacqueline Pier, MD as PCP - General (Internal Medicine) Jacqueline Overall, MD as Consulting Physician (General Surgery) Jacqueline Good, Jacqueline Dad, MD as Consulting Physician (Oncology) Jacqueline Gibson, MD as Attending Physician (Radiation Oncology) OTHER MD: Donnal Moat, PA-C [FAX [phone]]  CHIEF COMPLAINT: Estrogen receptor negative breast cancer  CURRENT TREATMENT: Neoadjuvant chemoimmunotherapy  INTERVAL HISTORY: Jacqueline Good returns today for follow up of her estrogen receptor negative but HER-2 positive breast cancer accompanied by her mother and brother.  The patient is receiving neoadjuvant chemoimmunotherapy with carboplatin, docetaxel, trastuzumab, and pertuzumab every 21 days with Granix support on day 3-6. Today is day 7 cycle 5.  Pertuzumab omitted due to diarrhea.  Gemcitabine was substituted for Docetaxel due to early peripheral neuropathy.  Her chemotherapy was delayed by a few days due to anemia and she received 2 units of blood and is feeling much better.  Her hemoglobin went from 7.3 to 10.4.  She has been tired and achy, but is otherwise feeling well.    Jacqueline Good also underwent echo on 11/15 that showed an EF of 55-60%.    REVIEW OF SYSTEMS: Jacqueline Good is doing much better today.  She is more active and feeling improved.  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  This is improving, and she notes it as a "little, tiny bit."  She notes that she hasn't been able to think about the holidays much because she has been so focused on her surgery.  She is happy to be finishing soon.    Otherwise, Jacqueline Good is doing well and a detailed ROS was non contributory.     HISTORY OF CURRENT ILLNESS: From the original intake note:  Jacqueline Good had routine screening mammography on 02/05/2018 showing a possible abnormality in the  right breast. She underwent unilateral right diagnostic mammography with tomography and right breast ultrasonography at [facility] on 02/15/2018 showing: Highly suspicious right breast mass at 10 o'clock position.  There was a second, 0.6 cm lesion at the 10:00 radiant which has not been biopsied.  Suspicious focal cortical thickening of a single right axillary lymph node.  Accordingly on 02/25/2018 she proceeded to biopsy of the right breast area in question. The pathology from this procedure showed ([id number]): Breast, right, needle core biopsy, OUQ with microscopic focus of invasive ductal carcinoma, grade 2, arising in a background of high grade ductal carcinoma in situ. 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   The patient's subsequent history is as detailed below.    PAST MEDICAL HISTORY: Past Medical History:  Diagnosis Date  . Arthritis   . Asthma   . breast ca dx'd 02/2018   right  . Depression     PAST SURGICAL HISTORY: Past Surgical History:  Procedure Laterality Date  . BREAST SURGERY    . endometrial ablation    . IR IMAGING GUIDED PORT INSERTION  03/18/2018  . MYOMECTOMY      FAMILY HISTORY Family History  Problem Relation Age of Onset  . Lupus Sister   . Heart disease Sister   . Breast cancer Mother    She notes that her father is currently 78 years old. Patients' mother is currently 9 years old and she was diagnosed with breast cancer at age 7. The patient has 4 brothers and 1 sister.  She has a maternal aunt with breast cancer and her maternal great grandmother with had ovarian cancer.    GYNECOLOGIC HISTORY:  No LMP recorded. Patient is postmenopausal. Menarche: 57 years old Age at first live birth: 57 years old [place] P1 LMP: 13-14 years ago s/p ablation Contraceptive: no HRT: no  Hysterectomy: no SO: no  SOCIAL HISTORY: She is currently unemployed.  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 She lives alone without pets. Her daughter is Jacqueline Good who lives in [place] and works in Therapist, art.  The patient has one grandchild. She goes to a [employer].       ADVANCED DIRECTIVES: Not in place.  At the 03/03/2018 visit the patient was given the appropriate documents to complete and notarized at her discretion   HEALTH MAINTENANCE: Social History   Tobacco Use  . Smoking status: Former Smoker    Packs/day: 1.00    Years: 20.00    Pack years: 20.00    Types: Cigarettes    Last attempt to quit: 05/15/2018    Years since quitting: 0.1  . Smokeless tobacco: Never Used  Substance Use Topics  . Alcohol use: Yes    Comment: occasionally  . Drug use: No     Colonoscopy: Never  PAP:   Bone density: Never   Allergies  Allergen Reactions  . Gadolinium Derivatives Itching and Cough    Pt began sneezing and coughing as soon as MRI contrast was injected. Severe nasal congestion. No rash or hives no SOB.   . Iodinated Diagnostic Agents Itching    Current Outpatient Medications  Medication Sig Dispense Refill  . albuterol (VENTOLIN HFA) 108 (90 Base) MCG/ACT inhaler INHALE 2 PUFFS INTO THE LUNGS EVERY 6 HOURS AS NEEDED FOR WHEEZING 54 g 3  . ALPRAZolam (XANAX) 1 MG tablet Take 1 mg by mouth every 6 (six) hours as needed (max 3.5). for anxiety  2  . busPIRone (BUSPAR) 30 MG tablet Take 30 mg by mouth 2 (two) times daily.  2  . cetirizine-pseudoephedrine (ZYRTEC-D) 5-120 MG tablet Take 1 tablet by mouth daily. 15 tablet 0  . cholestyramine (QUESTRAN) 4 g packet Take 1 packet (4 g total) by mouth 2 (two) times daily. 60 each 12  . dexamethasone (DECADRON) 4 MG tablet Take 2 tablets (8 mg total) by mouth 2 (two) times daily. Start the day before Taxotere. Take once the day after, then 2 times a day x 2d. 30 tablet 1  . diphenoxylate-atropine (LOMOTIL) 2.5-0.025 MG tablet Take 1 tablet by mouth 4 (four) times daily as needed for diarrhea or  loose stools. 30 tablet 0  . DULoxetine (CYMBALTA) 60 MG capsule TAKE 2 CAPSULES BY MOUTH DAILY IN THE MORNING.  5  . fluticasone (FLONASE) 50 MCG/ACT nasal spray Place 2 sprays into both nostrils daily. 1 g 0  . Fluticasone-Salmeterol (ADVAIR DISKUS) 250-50 MCG/DOSE AEPB INHALE 1 PUFF INTO THE LUNGS 2 TIMES DAILY. 60 each 2  . lidocaine-prilocaine (EMLA) cream Apply to affected area once 30 g 3  . loperamide (IMODIUM) 2 MG capsule Take 2 mg by mouth as needed for diarrhea or loose stools.    Marland Kitchen LORazepam (ATIVAN) 0.5 MG tablet Take 1 tablet (0.5 mg total) by mouth at bedtime as needed (Nausea or vomiting). 30 tablet 0  . naproxen (NAPROSYN) 500 MG tablet Take 1 tablet (500 mg total) by mouth 2 (two) times daily with a meal. 60 tablet 3  . omeprazole (PRILOSEC) 40 MG capsule  Take 1 capsule (40 mg total) by mouth daily. 30 capsule 2  . ondansetron (ZOFRAN) 8 MG tablet Take 1 tablet (8 mg total) by mouth every 8 (eight) hours as needed for nausea or vomiting. Do not take the first three days after chemo 20 tablet 0  . prochlorperazine (COMPAZINE) 10 MG tablet Take 1 tablet (10 mg total) by mouth every 6 (six) hours as needed (Nausea or vomiting). 30 tablet 1  . propranolol (INDERAL) 20 MG tablet Take 20 mg by mouth 3 (three) times daily.    Marland Kitchen venlafaxine (EFFEXOR) 75 MG tablet Take 1 tablet (75 mg total) by mouth 2 (two) times daily. 60 tablet 3  . zolpidem (AMBIEN) 10 MG tablet Take 10 mg by mouth at bedtime as needed for sleep (1/2-1 tab).      No current facility-administered medications for this visit.     OBJECTIVE:  Vitals:   06/30/18 1025  BP: 135/86  Pulse: 67  Resp: 20  Temp: 98.4 F (36.9 C)  SpO2: 100%     Body mass index is 35.39 kg/m.   Wt Readings from Last 3 Encounters:  06/30/18 206 lb 3.2 oz (93.5 kg)  06/21/18 213 lb 4.8 oz (96.8 kg)  06/15/18 206 lb (93.4 kg)  ECOG FS:1 - Symptomatic but completely ambulatory GENERAL: Patient is a well appearing female in no acute  distress HEENT:  Sclerae anicteric.  Oropharynx clear and moist. No ulcerations or evidence of oropharyngeal candidiasis. Neck is supple.  NODES:  No cervical, supraclavicular, or axillary lymphadenopathy palpated.  BREAST EXAM:  deferred LUNGS:  Clear to auscultation bilaterally.  No wheezes or rhonchi. HEART:  Regular rate and rhythm. No murmur appreciated. ABDOMEN:  Soft, nontender.  Positive, normoactive bowel sounds. No organomegaly palpated. MSK:  No focal spinal tenderness to palpation. Full range of motion bilaterally in the upper extremities. EXTREMITIES:  No peripheral edema.   SKIN:  Clear with no obvious rashes or skin changes. No nail dyscrasia. NEURO:  Nonfocal. Well oriented.  Appropriate affect.    LAB RESULTS:  CMP     Component Value Date/Time   NA 141 06/30/2018 1012   K 3.8 06/30/2018 1012   CL 108 06/30/2018 1012   CO2 23 06/30/2018 1012   GLUCOSE 101 (H) 06/30/2018 1012   BUN 23 (H) 06/30/2018 1012   CREATININE 0.88 06/30/2018 1012   CREATININE 0.80 03/03/2018 0830   CREATININE 0.83 09/24/2016 1151   CALCIUM 8.8 (L) 06/30/2018 1012   PROT 7.2 06/30/2018 1012   ALBUMIN 3.9 06/30/2018 1012   AST 53 (H) 06/30/2018 1012   AST 15 03/03/2018 0830   ALT 135 (H) 06/30/2018 1012   ALT 16 03/03/2018 0830   ALKPHOS 140 (H) 06/30/2018 1012   BILITOT 0.6 06/30/2018 1012   BILITOT 0.5 03/03/2018 0830   GFRNONAA >60 06/30/2018 1012   GFRNONAA >60 03/03/2018 0830   GFRNONAA 79 09/24/2016 1151   GFRAA >60 06/30/2018 1012   GFRAA >60 03/03/2018 0830   GFRAA >89 09/24/2016 1151    No results found for: TOTALPROTELP, ALBUMINELP, A1GS, A2GS, BETS, BETA2SER, GAMS, MSPIKE, SPEI  No results found for: KPAFRELGTCHN, LAMBDASER, KAPLAMBRATIO  Lab Results  Component Value Date   WBC 6.8 06/30/2018   NEUTROABS 5.7 06/30/2018   HGB 10.4 (L) 06/30/2018   HCT 32.6 (L) 06/30/2018   MCV 96.2 06/30/2018   PLT 165 06/30/2018    @LASTCHEMISTRY @  No results found for:  LABCA2  No components found for: PPIRJJ884  No  results for input(s): INR in the last 168 hours.  No results found for: LABCA2  No results found for: CHY850  No results found for: YDX412  No results found for: INO676  No results found for: CA2729  No components found for: HGQUANT  No results found for: CEA1 / No results found for: CEA1   No results found for: AFPTUMOR  No results found for: Jacqueline Good  No results found for: PSA1  Appointment on 06/30/2018  Component Date Value Ref Range Status  . WBC 06/30/2018 6.8  4.0 - 10.5 K/uL Final  . RBC 06/30/2018 3.39* 3.87 - 5.11 MIL/uL Final  . Hemoglobin 06/30/2018 10.4* 12.0 - 15.0 g/dL Final  . HCT 06/30/2018 32.6* 36.0 - 46.0 % Final  . MCV 06/30/2018 96.2  80.0 - 100.0 fL Final  . MCH 06/30/2018 30.7  26.0 - 34.0 pg Final  . MCHC 06/30/2018 31.9  30.0 - 36.0 g/dL Final  . RDW 06/30/2018 16.4* 11.5 - 15.5 % Final  . Platelets 06/30/2018 165  150 - 400 K/uL Final  . nRBC 06/30/2018 0.0  0.0 - 0.2 % Final  . Neutrophils Relative % 06/30/2018 83  % Final  . Neutro Abs 06/30/2018 5.7  1.7 - 7.7 K/uL Final  . Lymphocytes Relative 06/30/2018 14  % Final  . Lymphs Abs 06/30/2018 0.9  0.7 - 4.0 K/uL Final  . Monocytes Relative 06/30/2018 2  % Final  . Monocytes Absolute 06/30/2018 0.1  0.1 - 1.0 K/uL Final  . Eosinophils Relative 06/30/2018 0  % Final  . Eosinophils Absolute 06/30/2018 0.0  0.0 - 0.5 K/uL Final  . Basophils Relative 06/30/2018 0  % Final  . Basophils Absolute 06/30/2018 0.0  0.0 - 0.1 K/uL Final  . Immature Granulocytes 06/30/2018 1  % Final   rare meta  . Abs Immature Granulocytes 06/30/2018 0.07  0.00 - 0.07 K/uL Final   Performed at [facility], [place] [address] [place], [place] [address]  . Sodium 06/30/2018 141  135 - 145 mmol/L Final  . Potassium 06/30/2018 3.8  3.5 - 5.1 mmol/L Final  . Chloride 06/30/2018 108  98 - 111 mmol/L Final  . CO2 06/30/2018 23  22 - 32 mmol/L Final   . Glucose, Bld 06/30/2018 101* 70 - 99 mg/dL Final  . BUN 06/30/2018 23* 6 - 20 mg/dL Final  . Creatinine, Ser 06/30/2018 0.88  0.44 - 1.00 mg/dL Final  . Calcium 06/30/2018 8.8* 8.9 - 10.3 mg/dL Final  . Total Protein 06/30/2018 7.2  6.5 - 8.1 g/dL Final  . Albumin 06/30/2018 3.9  3.5 - 5.0 g/dL Final  . AST 06/30/2018 53* 15 - 41 U/L Final  . ALT 06/30/2018 135* 0 - 44 U/L Final  . Alkaline Phosphatase 06/30/2018 140* 38 - 126 U/L Final  . Total Bilirubin 06/30/2018 0.6  0.3 - 1.2 mg/dL Final  . GFR calc non Af Amer 06/30/2018 >60  >60 mL/min Final  . GFR calc Af Amer 06/30/2018 >60  >60 mL/min Final   Comment: (NOTE) The eGFR has been calculated using the CKD EPI equation. This calculation has not been validated in all clinical situations. eGFR's persistently <60 mL/min signify possible Chronic Kidney Disease.   Georgiann Hahn gap 06/30/2018 10  5 - 15 Final   Performed at [facility], [place] [address], [place],  [address]    (this displays the last labs from the last 3 days)  No results found for: TOTALPROTELP,  ALBUMINELP, A1GS, A2GS, BETS, BETA2SER, GAMS, MSPIKE, SPEI (this displays SPEP labs)  No results found for: KPAFRELGTCHN, LAMBDASER, KAPLAMBRATIO (kappa/lambda light chains)  No results found for: HGBA, HGBA2QUANT, HGBFQUANT, HGBSQUAN (Hemoglobinopathy evaluation)   No results found for: LDH  No results found for: IRON, TIBC, IRONPCTSAT (Iron and TIBC)  No results found for: FERRITIN  Urinalysis    Component Value Date/Time   COLORURINE CANCELED 09/24/2016 1151   APPEARANCEUR CANCELED 09/24/2016 1151   LABSPEC CANCELED 09/24/2016 1151   PHURINE CANCELED 09/24/2016 1151   GLUCOSEU CANCELED 09/24/2016 1151   HGBUR CANCELED 09/24/2016 1151   BILIRUBINUR CANCELED 09/24/2016 1151   KETONESUR CANCELED 09/24/2016 1151   PROTEINUR CANCELED 09/24/2016 1151   NITRITE CANCELED 09/24/2016 1151   LEUKOCYTESUR CANCELED 09/24/2016 1151      STUDIES:  No results found.  ELIGIBLE FOR AVAILABLE RESEARCH PROTOCOL:BCEP  ASSESSMENT: 56 y.o. [place] woman status post right breast upper outer quadrant biopsy 02/25/2018 for a clinical T2 pN1, stage IIB invasive ductal carcinoma, estrogen and progesterone receptor negative, HER-2 amplified, with an MIB-1 of 50%.  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   (1) genetics testing pending  (2) neoadjuvant chemotherapy will consist of carboplatin, docetaxel, trastuzumab and Pertuzumab every 21 days x 6 starting 03/23/2018  (a) pertuzumab omitted after cycle 1 due to poorly-controlled diarrhea  (b) docetaxel discontinued after cycle 3 due to neuropathy; gemcitabine substituted  (c) Granix substituted for Neulasta on days 3-6 following chemotherapy due to improved tolerance  (d) Switched back to Neulasta after cycles 5 and 6 per patient request  (3) anti-HER-2 treatment will continue for a minimum of 6 months  (a) baseline echocardiogram 03/16/2018 shows an ejection fraction in the 55-60% range.  (b) echo on 11/15 shows EF of 55-60%  (4) definitive surgery with targeted axillary dissection to follow  (5) adjuvant radiation  (6) left adrenal adenoma measuring 2.9 cm on CT of the chest 03/16/2018  PLAN:  Rhodesia is doing well today.  She is feeling so much better than last week. Her hemoglobin is much better and I am glad that clinically she is much improved.    She and I reviewed her schedule.  She has breast MRI on 07/02/18.  She has cycle 6 on 07/12/18.  I reviewed these dates with Dr. Jana Hakim and updated him on Linda.  He notes that all of these dates are ok, and she can receive her treatment as scheduled on 07/12/18 so long as her counts are ok.    [place] will return on 07/12/2018.  I reviewed the above with Bary Castilla, Breast Navigator.  Lacy knows to call for any questions or concerns prior to her next appointment with [place].     A total of (30) minutes of face-to-face time was spent with this patient with greater than 50% of that time in counseling and care-coordination.   Wilber Bihari, NP  06/30/18 1:15 PM Medical Oncology and Hematology [facility] [address] [place], [place] [address] Tel. [phone]    Fax. [phone]

## 2018-06-30 NOTE — Telephone Encounter (Signed)
No 11/20 los.

## 2018-07-02 ENCOUNTER — Ambulatory Visit (HOSPITAL_COMMUNITY)
Admission: RE | Admit: 2018-07-02 | Discharge: 2018-07-02 | Disposition: A | Discharge status: Home / Self Care | Payer: Medicaid Other | Source: Ambulatory Visit | Attending: Adult Health | Admitting: Adult Health

## 2018-07-02 DIAGNOSIS — C50411 Malignant neoplasm of upper-outer quadrant of right female breast: Secondary | ICD-10-CM

## 2018-07-02 DIAGNOSIS — Z171 Estrogen receptor negative status [ER-]: Secondary | ICD-10-CM

## 2018-07-02 MED FILL — predniSONE 50 MG TABS: 50 | 1 days supply | Qty: 3 | Fill #0

## 2018-07-05 ENCOUNTER — Ambulatory Visit (HOSPITAL_COMMUNITY): Payer: Medicaid Other

## 2018-07-06 ENCOUNTER — Ambulatory Visit: Payer: No Typology Code available for payment source

## 2018-07-06 ENCOUNTER — Other Ambulatory Visit: Payer: No Typology Code available for payment source

## 2018-07-06 MED FILL — predniSONE 50 MG TABS: 50 | 1 days supply | Qty: 3 | Fill #0

## 2018-07-09 ENCOUNTER — Ambulatory Visit: Payer: Self-pay

## 2018-07-09 ENCOUNTER — Other Ambulatory Visit: Payer: No Typology Code available for payment source

## 2018-07-09 ENCOUNTER — Ambulatory Visit (HOSPITAL_COMMUNITY)
Admission: RE | Admit: 2018-07-09 | Discharge: 2018-07-09 | Disposition: A | Discharge status: Home / Self Care | Payer: Medicaid Other | Source: Ambulatory Visit | Attending: Adult Health | Admitting: Adult Health

## 2018-07-09 DIAGNOSIS — C50411 Malignant neoplasm of upper-outer quadrant of right female breast: Secondary | ICD-10-CM | POA: Insufficient documentation

## 2018-07-09 DIAGNOSIS — Z171 Estrogen receptor negative status [ER-]: Secondary | ICD-10-CM | POA: Diagnosis not present

## 2018-07-09 MED ORDER — GADOBUTROL 1 MMOL/ML IV SOLN
10.0000 mL | Freq: Once | INTRAVENOUS | Status: AC | PRN
  Administered 2018-07-09: 10 mL via INTRAVENOUS

## 2018-07-12 ENCOUNTER — Encounter: Payer: Self-pay | Admitting: Adult Health

## 2018-07-12 ENCOUNTER — Telehealth: Payer: Self-pay

## 2018-07-12 ENCOUNTER — Inpatient Hospital Stay (HOSPITAL_BASED_OUTPATIENT_CLINIC_OR_DEPARTMENT_OTHER): Payer: Medicaid Other | Admitting: Adult Health

## 2018-07-12 ENCOUNTER — Inpatient Hospital Stay: Payer: Medicaid Other

## 2018-07-12 ENCOUNTER — Inpatient Hospital Stay: Payer: Medicaid Other | Attending: Oncology

## 2018-07-12 VITALS — BP 130/99 | HR 68 | Temp 98.3°F | Resp 20 | Ht 64.0 in | Wt 204.1 lb

## 2018-07-12 DIAGNOSIS — C50411 Malignant neoplasm of upper-outer quadrant of right female breast: Secondary | ICD-10-CM | POA: Diagnosis present

## 2018-07-12 DIAGNOSIS — R1310 Dysphagia, unspecified: Secondary | ICD-10-CM

## 2018-07-12 DIAGNOSIS — Z5112 Encounter for antineoplastic immunotherapy: Secondary | ICD-10-CM | POA: Diagnosis present

## 2018-07-12 DIAGNOSIS — Z5189 Encounter for other specified aftercare: Secondary | ICD-10-CM | POA: Diagnosis not present

## 2018-07-12 DIAGNOSIS — Z5111 Encounter for antineoplastic chemotherapy: Secondary | ICD-10-CM | POA: Insufficient documentation

## 2018-07-12 DIAGNOSIS — Z95828 Presence of other vascular implants and grafts: Secondary | ICD-10-CM

## 2018-07-12 DIAGNOSIS — Z803 Family history of malignant neoplasm of breast: Secondary | ICD-10-CM

## 2018-07-12 DIAGNOSIS — D3502 Benign neoplasm of left adrenal gland: Secondary | ICD-10-CM | POA: Diagnosis not present

## 2018-07-12 DIAGNOSIS — J45901 Unspecified asthma with (acute) exacerbation: Secondary | ICD-10-CM

## 2018-07-12 DIAGNOSIS — Z171 Estrogen receptor negative status [ER-]: Secondary | ICD-10-CM

## 2018-07-12 DIAGNOSIS — G629 Polyneuropathy, unspecified: Secondary | ICD-10-CM | POA: Diagnosis not present

## 2018-07-12 DIAGNOSIS — D649 Anemia, unspecified: Secondary | ICD-10-CM | POA: Diagnosis not present

## 2018-07-12 DIAGNOSIS — J441 Chronic obstructive pulmonary disease with (acute) exacerbation: Secondary | ICD-10-CM

## 2018-07-12 DIAGNOSIS — Z87891 Personal history of nicotine dependence: Secondary | ICD-10-CM

## 2018-07-12 DIAGNOSIS — Z8041 Family history of malignant neoplasm of ovary: Secondary | ICD-10-CM

## 2018-07-12 LAB — COMPREHENSIVE METABOLIC PANEL
ALBUMIN: 4.1 g/dL (ref 3.5–5.0)
ALT: 29 U/L (ref 0–44)
AST: 16 U/L (ref 15–41)
Alkaline Phosphatase: 131 U/L — ABNORMAL HIGH (ref 38–126)
Anion gap: 11 (ref 5–15)
BUN: 24 mg/dL — AB (ref 6–20)
CHLORIDE: 108 mmol/L (ref 98–111)
CO2: 23 mmol/L (ref 22–32)
CREATININE: 0.96 mg/dL (ref 0.44–1.00)
Calcium: 9.6 mg/dL (ref 8.9–10.3)
GFR calc Af Amer: >60 mL/min (ref >60)
GLUCOSE: 83 mg/dL (ref 70–99)
Potassium: 4.4 mmol/L (ref 3.5–5.1)
SODIUM: 142 mmol/L (ref 135–145)
Total Bilirubin: 0.5 mg/dL (ref 0.3–1.2)
Total Protein: 7.3 g/dL (ref 6.5–8.1)

## 2018-07-12 LAB — CBC WITH DIFFERENTIAL/PLATELET
Abs Immature Granulocytes: 0.56 10*3/uL — ABNORMAL HIGH (ref 0.00–0.07)
Basophils Absolute: 0 10*3/uL (ref 0.0–0.1)
Basophils Relative: 0 %
EOS PCT: 0 %
Eosinophils Absolute: 0 10*3/uL (ref 0.0–0.5)
HEMATOCRIT: 28.9 % — AB (ref 36.0–46.0)
HEMOGLOBIN: 9.3 g/dL — AB (ref 12.0–15.0)
Immature Granulocytes: 4 %
LYMPHS ABS: 2.2 10*3/uL (ref 0.7–4.0)
LYMPHS PCT: 17 %
MCH: 30.4 pg (ref 26.0–34.0)
MCHC: 32.2 g/dL (ref 30.0–36.0)
MCV: 94.4 fL (ref 80.0–100.0)
MONO ABS: 1.2 10*3/uL — AB (ref 0.1–1.0)
Monocytes Relative: 9 %
NEUTROS ABS: 9.3 10*3/uL — AB (ref 1.7–7.7)
Neutrophils Relative %: 70 %
Platelets: 111 10*3/uL — ABNORMAL LOW (ref 150–400)
RBC: 3.06 MIL/uL — AB (ref 3.87–5.11)
RDW: 15.6 % — ABNORMAL HIGH (ref 11.5–15.5)
WBC: 13.3 10*3/uL — ABNORMAL HIGH (ref 4.0–10.5)
nRBC: 0.2 % (ref 0.0–0.2)

## 2018-07-12 MED ORDER — SODIUM CHLORIDE 0.9 % IV SOLN
Freq: Once | INTRAVENOUS | Status: AC
  Administered 2018-07-12: 11:00:00 via INTRAVENOUS
  Filled 2018-07-12: qty 5

## 2018-07-12 MED ORDER — SODIUM CHLORIDE 0.9% FLUSH
10.0000 mL | Freq: Once | INTRAVENOUS | Status: AC
  Administered 2018-07-12: 10 mL
  Filled 2018-07-12: qty 10

## 2018-07-12 MED ORDER — ACETAMINOPHEN 325 MG PO TABS
650.0000 mg | ORAL_TABLET | Freq: Once | ORAL | Status: AC
  Administered 2018-07-12: 650 mg via ORAL

## 2018-07-12 MED ORDER — SODIUM CHLORIDE 0.9 % IV SOLN
620.0000 mg | Freq: Once | INTRAVENOUS | Status: AC
  Administered 2018-07-12: 620 mg via INTRAVENOUS
  Filled 2018-07-12: qty 62

## 2018-07-12 MED ORDER — SODIUM CHLORIDE 0.9 % IV SOLN
800.0000 mg/m2 | Freq: Once | INTRAVENOUS | Status: AC
  Administered 2018-07-12: 1672 mg via INTRAVENOUS
  Filled 2018-07-12: qty 43.97

## 2018-07-12 MED ORDER — TRASTUZUMAB CHEMO 150 MG IV SOLR
600.0000 mg | Freq: Once | INTRAVENOUS | Status: AC
  Administered 2018-07-12: 600 mg via INTRAVENOUS
  Filled 2018-07-12: qty 28.57

## 2018-07-12 MED ORDER — SODIUM CHLORIDE 0.9 % IV SOLN
Freq: Once | INTRAVENOUS | Status: AC
  Administered 2018-07-12: 11:00:00 via INTRAVENOUS
  Filled 2018-07-12: qty 250

## 2018-07-12 MED ORDER — HEPARIN SOD (PORK) LOCK FLUSH 100 UNIT/ML IV SOLN
500.0000 [IU] | Freq: Once | INTRAVENOUS | Status: AC | PRN
  Administered 2018-07-12: 500 [IU]
  Filled 2018-07-12: qty 5

## 2018-07-12 MED ORDER — DIPHENHYDRAMINE HCL 25 MG PO CAPS
25.0000 mg | ORAL_CAPSULE | Freq: Once | ORAL | Status: AC
  Administered 2018-07-12: 25 mg via ORAL

## 2018-07-12 MED ORDER — SODIUM CHLORIDE 0.9% FLUSH
10.0000 mL | INTRAVENOUS | Status: DC | PRN
  Administered 2018-07-12: 10 mL
  Filled 2018-07-12: qty 10

## 2018-07-12 MED ORDER — DIPHENHYDRAMINE HCL 25 MG PO CAPS
ORAL_CAPSULE | ORAL | Status: AC
  Filled 2018-07-12: qty 1

## 2018-07-12 MED ORDER — PALONOSETRON HCL INJECTION 0.25 MG/5ML
0.2500 mg | Freq: Once | INTRAVENOUS | Status: AC
  Administered 2018-07-12: 0.25 mg via INTRAVENOUS

## 2018-07-12 MED ORDER — ACETAMINOPHEN 325 MG PO TABS
ORAL_TABLET | ORAL | Status: AC
  Filled 2018-07-12: qty 2

## 2018-07-12 MED ORDER — PALONOSETRON HCL INJECTION 0.25 MG/5ML
INTRAVENOUS | Status: AC
  Filled 2018-07-12: qty 5

## 2018-07-12 NOTE — Progress Notes (Signed)
[facility]  [phone] [phone]     ID: APPLE DEARMAS DOB: 03/26/1961  MR#: [id number]  ZDG#:[id number]  Patient Care Team: [name], MD as PCP - General (Internal Medicine) Alphonsa Overall, MD as Consulting Physician (General Surgery) [name], [name], MD as Consulting Physician (Oncology) Eppie Gibson, MD as Attending Physician (Radiation Oncology) OTHER MD: Donnal Moat, PA-C [FAX [phone]]  CHIEF COMPLAINT: Estrogen receptor negative breast cancer  CURRENT TREATMENT: Neoadjuvant chemoimmunotherapy  INTERVAL HISTORY: Jacqueline Good returns today for follow up of her estrogen receptor negative but HER-2 positive breast cancer accompanied by her mother and brother.  The patient is receiving neoadjuvant chemoimmunotherapy with carboplatin, docetaxel, trastuzumab, and pertuzumab every 21 days with Granix support on day 3-6. Today is day 1 cycle 6.  Pertuzumab omitted due to diarrhea.  Gemcitabine was substituted for Docetaxel due to early peripheral neuropathy.  REVIEW OF SYSTEMS: Jacqueline Good is doing much better today.  She continues to have mild peripheral neuropathy.  She is taking Omeprazole daily, and sometimes BID.  She notes that sometimes she has some difficulty swallowing, but if she takes a second omeprazole, it will help.  She denies any nausea or vomiting.  She isn't having any chest pain, palpitations, cough, fevers, or chills.  She is without bowel/bladder issues, or any new pain.  She is awaiting her breast MRI results.  Otherwise, a detailed ROS was non contributory today.  Jacqueline Good Kitchen    HISTORY OF CURRENT ILLNESS: From the original intake note:  Jacqueline Good had routine screening mammography on 02/05/2018 showing a possible abnormality in the right breast. She underwent unilateral right diagnostic mammography with tomography and right breast ultrasonography at [facility] on 02/15/2018 showing: Highly suspicious right breast mass at  10 o'clock position.  There was a second, 0.6 cm lesion at the 10:00 radiant which has not been biopsied.  Suspicious focal cortical thickening of a single right axillary lymph node.  Accordingly on 02/25/2018 she proceeded to biopsy of the right breast area in question. The pathology from this procedure showed ([id number]): Breast, right, needle core biopsy, OUQ with microscopic focus of invasive ductal carcinoma, grade 2, arising in a background of high grade ductal carcinoma in situ. 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   The patient's subsequent history is as detailed below.    PAST MEDICAL HISTORY: Past Medical History:  Diagnosis Date  . Arthritis   . Asthma   . breast ca dx'd 02/2018   right  . Depression     PAST SURGICAL HISTORY: Past Surgical History:  Procedure Laterality Date  . BREAST SURGERY    . endometrial ablation    . IR IMAGING GUIDED PORT INSERTION  03/18/2018  . MYOMECTOMY      FAMILY HISTORY Family History  Problem Relation Age of Onset  . Lupus Sister   . Heart disease Sister   . Breast cancer Mother    She notes that her father is currently 10 years old. Patients' mother is currently 94 years old and she was diagnosed with breast cancer at age 28. The patient has 4 brothers and 1 sister. She has a maternal aunt with breast cancer and her maternal great grandmother with had ovarian cancer.    GYNECOLOGIC HISTORY:  No LMP recorded. Patient is postmenopausal. Menarche: 57 years old Age at first live birth: 57 years old [place] P1 LMP: 13-14 years ago s/p ablation Contraceptive: no  HRT: no  Hysterectomy: no SO: no  SOCIAL HISTORY: She is currently unemployed.  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 She lives alone without pets. Her daughter is [name] who lives in [place] and works in Therapist, art.  The patient has one grandchild. She goes to a  [employer].       ADVANCED DIRECTIVES: Not in place.  At the 03/03/2018 visit the patient was given the appropriate documents to complete and notarized at her discretion   HEALTH MAINTENANCE: Social History   Tobacco Use  . Smoking status: Former Smoker    Packs/day: 1.00    Years: 20.00    Pack years: 20.00    Types: Cigarettes    Last attempt to quit: 05/15/2018    Years since quitting: 0.1  . Smokeless tobacco: Never Used  Substance Use Topics  . Alcohol use: Yes    Comment: occasionally  . Drug use: No     Colonoscopy: Never  PAP:   Bone density: Never   Allergies  Allergen Reactions  . Gadolinium Derivatives Itching and Cough    Pt began sneezing and coughing as soon as MRI contrast was injected. Severe nasal congestion. No rash or hives no SOB.   . Iodinated Diagnostic Agents Itching    Current Outpatient Medications  Medication Sig Dispense Refill  . albuterol (VENTOLIN HFA) 108 (90 Base) MCG/ACT inhaler INHALE 2 PUFFS INTO THE LUNGS EVERY 6 HOURS AS NEEDED FOR WHEEZING 54 g 3  . ALPRAZolam (XANAX) 1 MG tablet Take 1 mg by mouth every 6 (six) hours as needed (max 3.5). for anxiety  2  . busPIRone (BUSPAR) 30 MG tablet Take 30 mg by mouth 2 (two) times daily.  2  . cetirizine-pseudoephedrine (ZYRTEC-D) 5-120 MG tablet Take 1 tablet by mouth daily. 15 tablet 0  . cholestyramine (QUESTRAN) 4 g packet Take 1 packet (4 g total) by mouth 2 (two) times daily. 60 each 12  . dexamethasone (DECADRON) 4 MG tablet Take 2 tablets (8 mg total) by mouth 2 (two) times daily. Start the day before Taxotere. Take once the day after, then 2 times a day x 2d. 30 tablet 1  . diphenoxylate-atropine (LOMOTIL) 2.5-0.025 MG tablet Take 1 tablet by mouth 4 (four) times daily as needed for diarrhea or loose stools. 30 tablet 0  . DULoxetine (CYMBALTA) 60 MG capsule TAKE 2 CAPSULES BY MOUTH DAILY IN THE MORNING.  5  . fluticasone (FLONASE) 50 MCG/ACT nasal spray Place 2 sprays  into both nostrils daily. 1 g 0  . Fluticasone-Salmeterol (ADVAIR DISKUS) 250-50 MCG/DOSE AEPB INHALE 1 PUFF INTO THE LUNGS 2 TIMES DAILY. 60 each 2  . lidocaine-prilocaine (EMLA) cream Apply to affected area once 30 g 3  . loperamide (IMODIUM) 2 MG capsule Take 2 mg by mouth as needed for diarrhea or loose stools.    Jacqueline Good Kitchen LORazepam (ATIVAN) 0.5 MG tablet Take 1 tablet (0.5 mg total) by mouth at bedtime as needed (Nausea or vomiting). 30 tablet 0  . naproxen (NAPROSYN) 500 MG tablet Take 1 tablet (500 mg total) by mouth 2 (two) times daily with a meal. 60 tablet 3  . omeprazole (PRILOSEC) 40 MG capsule Take 1 capsule (40 mg total) by mouth daily. 30 capsule 2  . ondansetron (ZOFRAN) 8 MG tablet Take 1 tablet (8 mg total) by mouth every 8 (eight) hours as needed for nausea or vomiting. Do not take the first three days after chemo 20 tablet 0  .  prochlorperazine (COMPAZINE) 10 MG tablet Take 1 tablet (10 mg total) by mouth every 6 (six) hours as needed (Nausea or vomiting). 30 tablet 1  . propranolol (INDERAL) 20 MG tablet Take 20 mg by mouth 3 (three) times daily.    Jacqueline Good Kitchen venlafaxine (EFFEXOR) 75 MG tablet Take 1 tablet (75 mg total) by mouth 2 (two) times daily. 60 tablet 3  . zolpidem (AMBIEN) 10 MG tablet Take 10 mg by mouth at bedtime as needed for sleep (1/2-1 tab).      No current facility-administered medications for this visit.    Facility-Administered Medications Ordered in Other Visits  Medication Dose Route Frequency Provider Last Rate Last Dose  . CARBOplatin (PARAPLATIN) 620 mg in sodium chloride 0.9 % 250 mL chemo infusion  620 mg Intravenous Once [name], [name], MD      . gemcitabine (GEMZAR) 1,672 mg in sodium chloride 0.9 % 250 mL chemo infusion  800 mg/m2 (Treatment Plan Recorded) Intravenous Once [name], [name], MD      . heparin lock flush 100 unit/mL  500 Units Intracatheter Once PRN [name], [name], MD      . sodium chloride flush (NS) 0.9 % injection 10 mL  10 mL  Intracatheter PRN [name], [name], MD      . trastuzumab (HERCEPTIN) 600 mg in sodium chloride 0.9 % 250 mL chemo infusion  600 mg Intravenous Once [name], [name], MD        OBJECTIVE:  Vitals:   07/12/18 0950  BP: (!) 130/99  Pulse: 68  Resp: 20  Temp: 98.3 F (36.8 C)  SpO2: 99%     Body mass index is 35.03 kg/m.   Wt Readings from Last 3 Encounters:  07/12/18 204 lb 1.6 oz (92.6 kg)  06/30/18 206 lb 3.2 oz (93.5 kg)  06/21/18 213 lb 4.8 oz (96.8 kg)  ECOG FS:1 - Symptomatic but completely ambulatory GENERAL: Patient is a well appearing female in no acute distress HEENT:  Sclerae anicteric.  Oropharynx clear and moist. No ulcerations or evidence of oropharyngeal candidiasis. Neck is supple.  NODES:  No cervical, supraclavicular, or axillary lymphadenopathy palpated.  BREAST EXAM:  deferred LUNGS:  Clear to auscultation bilaterally.  No wheezes or rhonchi. HEART:  Regular rate and rhythm. No murmur appreciated. ABDOMEN:  Soft, nontender.  Positive, normoactive bowel sounds. No organomegaly palpated. MSK:  No focal spinal tenderness to palpation. Full range of motion bilaterally in the upper extremities. EXTREMITIES:  No peripheral edema.   SKIN:  Clear with no obvious rashes or skin changes. No nail dyscrasia. NEURO:  Nonfocal. Well oriented.  Appropriate affect.    LAB RESULTS:  CMP     Component Value Date/Time   NA 142 07/12/2018 0925   K 4.4 07/12/2018 0925   CL 108 07/12/2018 0925   CO2 23 07/12/2018 0925   GLUCOSE 83 07/12/2018 0925   BUN 24 (H) 07/12/2018 0925   CREATININE 0.96 07/12/2018 0925   CREATININE 0.80 03/03/2018 0830   CREATININE 0.83 09/24/2016 1151   CALCIUM 9.6 07/12/2018 0925   PROT 7.3 07/12/2018 0925   ALBUMIN 4.1 07/12/2018 0925   AST 16 07/12/2018 0925   AST 15 03/03/2018 0830   ALT 29 07/12/2018 0925   ALT 16 03/03/2018 0830   ALKPHOS 131 (H) 07/12/2018 0925   BILITOT 0.5 07/12/2018 0925   BILITOT 0.5 03/03/2018 0830    GFRNONAA >60 07/12/2018 0925   GFRNONAA >60 03/03/2018 0830   GFRNONAA 79 09/24/2016 1151  GFRAA >60 07/12/2018 0925   GFRAA >60 03/03/2018 0830   GFRAA >89 09/24/2016 1151    No results found for: TOTALPROTELP, ALBUMINELP, A1GS, A2GS, BETS, BETA2SER, GAMS, MSPIKE, SPEI  No results found for: KPAFRELGTCHN, LAMBDASER, KAPLAMBRATIO  Lab Results  Component Value Date   WBC 13.3 (H) 07/12/2018   NEUTROABS 9.3 (H) 07/12/2018   HGB 9.3 (L) 07/12/2018   HCT 28.9 (L) 07/12/2018   MCV 94.4 07/12/2018   PLT 111 (L) 07/12/2018    _0 @  No results found for: LABCA2  No components found for: ACZYSA630  No results for input(s): INR in the last 168 hours.  No results found for: LABCA2  No results found for: ZSW109  No results found for: NAT557  No results found for: DUK025  No results found for: CA2729  No components found for: HGQUANT  No results found for: CEA1 / No results found for: CEA1   No results found for: AFPTUMOR  No results found for: CHROMOGRNA  No results found for: PSA1  Appointment on 07/12/2018  Component Date Value Ref Range Status  . Sodium 07/12/2018 142  135 - 145 mmol/L Final  . Potassium 07/12/2018 4.4  3.5 - 5.1 mmol/L Final  . Chloride 07/12/2018 108  98 - 111 mmol/L Final  . CO2 07/12/2018 23  22 - 32 mmol/L Final  . Glucose, Bld 07/12/2018 83  70 - 99 mg/dL Final  . BUN 07/12/2018 24* 6 - 20 mg/dL Final  . Creatinine, Ser 07/12/2018 0.96  0.44 - 1.00 mg/dL Final  . Calcium 07/12/2018 9.6  8.9 - 10.3 mg/dL Final  . Total Protein 07/12/2018 7.3  6.5 - 8.1 g/dL Final  . Albumin 07/12/2018 4.1  3.5 - 5.0 g/dL Final  . AST 07/12/2018 16  15 - 41 U/L Final  . ALT 07/12/2018 29  0 - 44 U/L Final  . Alkaline Phosphatase 07/12/2018 131* 38 - 126 U/L Final  . Total Bilirubin 07/12/2018 0.5  0.3 - 1.2 mg/dL Final  . GFR calc non Af Amer 07/12/2018 >60  >60 mL/min Final  . GFR calc Af Amer 07/12/2018 >60  >60 mL/min Final  . Anion gap  07/12/2018 11  5 - 15 Final   Performed at [facility], [place] [address] [place], [place] [address]  . WBC 07/12/2018 13.3* 4.0 - 10.5 K/uL Final  . RBC 07/12/2018 3.06* 3.87 - 5.11 MIL/uL Final  . Hemoglobin 07/12/2018 9.3* 12.0 - 15.0 g/dL Final  . HCT 07/12/2018 28.9* 36.0 - 46.0 % Final  . MCV 07/12/2018 94.4  80.0 - 100.0 fL Final  . MCH 07/12/2018 30.4  26.0 - 34.0 pg Final  . MCHC 07/12/2018 32.2  30.0 - 36.0 g/dL Final  . RDW 07/12/2018 15.6* 11.5 - 15.5 % Final  . Platelets 07/12/2018 111* 150 - 400 K/uL Final  . nRBC 07/12/2018 0.2  0.0 - 0.2 % Final  . Neutrophils Relative % 07/12/2018 70  % Final  . Neutro Abs 07/12/2018 9.3* 1.7 - 7.7 K/uL Final  . Lymphocytes Relative 07/12/2018 17  % Final  . Lymphs Abs 07/12/2018 2.2  0.7 - 4.0 K/uL Final  . Monocytes Relative 07/12/2018 9  % Final  . Monocytes Absolute 07/12/2018 1.2* 0.1 - 1.0 K/uL Final  . Eosinophils Relative 07/12/2018 0  % Final  . Eosinophils Absolute 07/12/2018 0.0  0.0 - 0.5 K/uL Final  . Basophils Relative 07/12/2018 0  % Final  . Basophils Absolute 07/12/2018 0.0  0.0 - 0.1 K/uL Final  . Immature Granulocytes 07/12/2018 4  % Final  . Abs Immature Granulocytes 07/12/2018 0.56* 0.00 - 0.07 K/uL Final   Performed at [facility], [address] [place], [place] [address]    (this displays the last labs from the last 3 days)  No results found for: TOTALPROTELP, ALBUMINELP, A1GS, A2GS, BETS, BETA2SER, GAMS, MSPIKE, SPEI (this displays SPEP labs)  No results found for: KPAFRELGTCHN, LAMBDASER, KAPLAMBRATIO (kappa/lambda light chains)  No results found for: HGBA, HGBA2QUANT, HGBFQUANT, HGBSQUAN (Hemoglobinopathy evaluation)   No results found for: LDH  No results found for: IRON, TIBC, IRONPCTSAT (Iron and TIBC)  No results found for: FERRITIN  Urinalysis    Component Value Date/Time   COLORURINE CANCELED 09/24/2016 1151   APPEARANCEUR CANCELED  09/24/2016 1151   LABSPEC CANCELED 09/24/2016 1151   PHURINE CANCELED 09/24/2016 1151   GLUCOSEU CANCELED 09/24/2016 1151   HGBUR CANCELED 09/24/2016 1151   BILIRUBINUR CANCELED 09/24/2016 1151   KETONESUR CANCELED 09/24/2016 1151   PROTEINUR CANCELED 09/24/2016 1151   NITRITE CANCELED 09/24/2016 1151   LEUKOCYTESUR CANCELED 09/24/2016 1151     STUDIES:  No results found.  ELIGIBLE FOR AVAILABLE RESEARCH PROTOCOL:BCEP  ASSESSMENT: 57 y.o. [place] woman status post right breast upper outer quadrant biopsy 02/25/2018 for a clinical T2 pN1, stage IIB invasive ductal carcinoma, estrogen and progesterone receptor negative, HER-2 amplified, with an MIB-1 of 50%.  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   (1) genetics testing pending  (2) neoadjuvant chemotherapy will consist of carboplatin, docetaxel, trastuzumab and Pertuzumab every 21 days x 6 starting 03/23/2018  (a) pertuzumab omitted after cycle 1 due to poorly-controlled diarrhea  (b) docetaxel discontinued after cycle 3 due to neuropathy; gemcitabine substituted  (c) Granix substituted for Neulasta on days 3-6 following chemotherapy due to improved tolerance  (d) Switched back to Neulasta after cycles 5 and 6 per patient request  (3) anti-HER-2 treatment will continue for a minimum of 6 months  (a) baseline echocardiogram 03/16/2018 shows an ejection fraction in the 55-60% range.  (b) echo on 11/15 shows EF of 55-60%  (4) definitive surgery with targeted axillary dissection to follow  (5) adjuvant radiation  (6) left adrenal adenoma measuring 2.9 cm on CT of the chest 03/16/2018  PLAN:  Jacqueline Good is doing well today.  Her labs are stable, and per Dr. Jana Hakim she can proceed with her final cycle of neoadjuvant chemotherapy today.  She is tolerating it well.  She will see Dr. Lucia Gaskins on 12/11 to review her MRI results and discuss surgery. We reviewed again that she will need to  conitnue on anti-Her2 therapy for at least 6 months from its start date.  Lavonna's swallowing issue is perplexing.  I recommended doing a barium swallow, however she declined at this point.  She will continue taking the Omeprazole, and I recommended she take it daily first thing in the morning on an empty stomach, and that she can take it in the morning and at night if needed.  Hopefully this will be resolved after she completes her last chemotherapy.    Kamoni will return to see [place] on 08/06/18 for labs, f/u, and adjuvant Herceptin.  I will hopefully have breast MRI results today before she finishes her chemotherapy and will be able to review them with her in detail.    She knows to call for any questions or concerns prior to her next appointment with [place].  A total of (20) minutes of face-to-face time was spent with this patient with greater than 50% of that time in counseling and care-coordination.   Wilber Bihari, NP  07/12/18 11:47 AM Medical Oncology and Hematology [facility] [address] [place], [place] [address] Tel. [phone]    Fax. [phone]

## 2018-07-12 NOTE — Patient Instructions (Signed)
[facility] Discharge Instructions for Patients Receiving Chemotherapy  Today you received the following chemotherapy agents Gemcitabine (GEMZAR), Trastuzumab (HERCEPTIN) & Carboplatin (PARAPLATIN).  To help prevent nausea and vomiting after your treatment, we encourage you to take your nausea medication as prescribed.   If you develop nausea and vomiting that is not controlled by your nausea medication, call the clinic.   BELOW ARE SYMPTOMS THAT SHOULD BE REPORTED IMMEDIATELY:  *FEVER GREATER THAN 100.5 F  *CHILLS WITH OR WITHOUT FEVER  NAUSEA AND VOMITING THAT IS NOT CONTROLLED WITH YOUR NAUSEA MEDICATION  *UNUSUAL SHORTNESS OF BREATH  *UNUSUAL BRUISING OR BLEEDING  TENDERNESS IN MOUTH AND THROAT WITH OR WITHOUT PRESENCE OF ULCERS  *URINARY PROBLEMS  *BOWEL PROBLEMS  UNUSUAL RASH Items with * indicate a potential emergency and should be followed up as soon as possible.  Feel free to call the clinic should you have any questions or concerns. The clinic phone number is [phone].  Please show the [facility] at check-in to the Emergency Department and triage nurse.

## 2018-07-14 ENCOUNTER — Other Ambulatory Visit: Payer: Self-pay | Admitting: Oncology

## 2018-07-14 ENCOUNTER — Inpatient Hospital Stay: Payer: Medicaid Other

## 2018-07-14 VITALS — BP 90/55 | HR 69 | Temp 98.5°F | Resp 18

## 2018-07-14 DIAGNOSIS — C50411 Malignant neoplasm of upper-outer quadrant of right female breast: Secondary | ICD-10-CM

## 2018-07-14 DIAGNOSIS — Z5111 Encounter for antineoplastic chemotherapy: Secondary | ICD-10-CM | POA: Diagnosis not present

## 2018-07-14 DIAGNOSIS — Z171 Estrogen receptor negative status [ER-]: Secondary | ICD-10-CM

## 2018-07-14 MED ORDER — PEGFILGRASTIM INJECTION 6 MG/0.6ML ~~LOC~~
6.0000 mg | PREFILLED_SYRINGE | Freq: Once | SUBCUTANEOUS | Status: AC
  Administered 2018-07-14: 6 mg via SUBCUTANEOUS

## 2018-07-14 MED ORDER — PEGFILGRASTIM INJECTION 6 MG/0.6ML ~~LOC~~
PREFILLED_SYRINGE | SUBCUTANEOUS | Status: AC
  Filled 2018-07-14: qty 0.6

## 2018-07-14 NOTE — Patient Instructions (Signed)
Pegfilgrastim injection What is this medicine? PEGFILGRASTIM (PEG fil gra stim) is a long-acting granulocyte colony-stimulating factor that stimulates the growth of neutrophils, a type of white blood cell important in the body's fight against infection. It is used to reduce the incidence of fever and infection in patients with certain types of cancer who are receiving chemotherapy that affects the bone marrow, and to increase survival after being exposed to high doses of radiation. This medicine may be used for other purposes; ask your health care provider or pharmacist if you have questions. COMMON BRAND NAME(S): Neulasta What should I tell my health care provider before I take this medicine? They need to know if you have any of these conditions: -kidney disease -latex allergy -ongoing radiation therapy -sickle cell disease -skin reactions to acrylic adhesives (On-Body Injector only) -an unusual or allergic reaction to pegfilgrastim, filgrastim, other medicines, foods, dyes, or preservatives -pregnant or trying to get pregnant -breast-feeding How should I use this medicine? This medicine is for injection under the skin. If you get this medicine at home, you will be taught how to prepare and give the pre-filled syringe or how to use the On-body Injector. Refer to the patient Instructions for Use for detailed instructions. Use exactly as directed. Tell your healthcare provider immediately if you suspect that the On-body Injector may not have performed as intended or if you suspect the use of the On-body Injector resulted in a missed or partial dose. It is important that you put your used needles and syringes in a special sharps container. Do not put them in a trash can. If you do not have a sharps container, call your pharmacist or healthcare provider to get one. Talk to your pediatrician regarding the use of this medicine in children. While this drug may be prescribed for selected conditions,  precautions do apply. Overdosage: If you think you have taken too much of this medicine contact a poison control center or emergency room at once. NOTE: This medicine is only for you. Do not share this medicine with others. What if I miss a dose? It is important not to miss your dose. Call your doctor or health care professional if you miss your dose. If you miss a dose due to an On-body Injector failure or leakage, a new dose should be administered as soon as possible using a single prefilled syringe for manual use. What may interact with this medicine? Interactions have not been studied. Give your health care provider a list of all the medicines, herbs, non-prescription drugs, or dietary supplements you use. Also tell them if you smoke, drink alcohol, or use illegal drugs. Some items may interact with your medicine. This list may not describe all possible interactions. Give your health care provider a list of all the medicines, herbs, non-prescription drugs, or dietary supplements you use. Also tell them if you smoke, drink alcohol, or use illegal drugs. Some items may interact with your medicine. What should I watch for while using this medicine? You may need blood work done while you are taking this medicine. If you are going to need a MRI, CT scan, or other procedure, tell your doctor that you are using this medicine (On-Body Injector only). What side effects may I notice from receiving this medicine? Side effects that you should report to your doctor or health care professional as soon as possible: -allergic reactions like skin rash, itching or hives, swelling of the face, lips, or tongue -dizziness -fever -pain, redness, or irritation at site   where injected -pinpoint red spots on the skin -red or dark-brown urine -shortness of breath or breathing problems -stomach or side pain, or pain at the shoulder -swelling -tiredness -trouble passing urine or change in the amount of urine Side  effects that usually do not require medical attention (report to your doctor or health care professional if they continue or are bothersome): -bone pain -muscle pain This list may not describe all possible side effects. Call your doctor for medical advice about side effects. You may report side effects to FDA at 1-800-FDA-1088. Where should I keep my medicine? Keep out of the reach of children. Store pre-filled syringes in a refrigerator between 2 and 8 degrees C (36 and 46 degrees F). Do not freeze. Keep in carton to protect from light. Throw away this medicine if it is left out of the refrigerator for more than 48 hours. Throw away any unused medicine after the expiration date. NOTE: This sheet is a summary. It may not cover all possible information. If you have questions about this medicine, talk to your doctor, pharmacist, or health care provider.  2018 [employer] (2016-07-24 12:58:03)  

## 2018-07-15 ENCOUNTER — Ambulatory Visit: Payer: Medicaid Other

## 2018-07-16 ENCOUNTER — Ambulatory Visit: Payer: Medicaid Other

## 2018-07-17 ENCOUNTER — Ambulatory Visit: Payer: Medicaid Other

## 2018-07-27 ENCOUNTER — Other Ambulatory Visit: Payer: No Typology Code available for payment source

## 2018-07-27 ENCOUNTER — Ambulatory Visit: Payer: No Typology Code available for payment source

## 2018-07-28 ENCOUNTER — Ambulatory Visit: Payer: Medicaid Other

## 2018-07-28 ENCOUNTER — Other Ambulatory Visit: Payer: Self-pay | Admitting: Surgery

## 2018-07-28 MED FILL — DULoxetine HCL 60 MG CPEP: 60 | 30 days supply | Qty: 60 | Fill #2

## 2018-07-28 MED FILL — busPIRone HCL 30 MG TABS: 30 | 30 days supply | Qty: 60 | Fill #0

## 2018-07-28 MED FILL — VENLAFAXINE HCL 75 MG TAB: 75 | 30 days supply | Qty: 60 | Fill #0

## 2018-07-29 ENCOUNTER — Ambulatory Visit: Payer: Medicaid Other

## 2018-07-29 NOTE — Pre-Procedure Instructions (Signed)
Jacqueline Good  07/29/2018      [place], [place] [address] [place] [address] Phone: [phone] Fax: [place], [place] [address] [facility]. [address] [place] [address] Phone: [phone] Fax: [place], [place] [address] Phone: [phone] Fax: [phone]    Your procedure is scheduled on Jan. 2  Report to [facility] Admitting at 7:00 A.M.  Call this number if you have problems the morning of surgery:  [phone]   Remember:  Do not eat  after midnight.   You may drink clear liquids until 6:00A.M .  Clear liquids allowed are:   Water, Juice (non-citric and without pulp), Carbonated beverages, Clear Tea, Black Coffee only, Plain Jell-O only, Gatorade and Plain Popsicles only    Take these medicines the morning of surgery with A SIP OF WATER :              Albuterol inhaler if needed--bring to hospital             Alprazolam (xanax)            Buspirone (buspar)            Dexamethasone (decadron)            Duloxetine (cymbalta)            flonase spray            advair if needed--bring to hospital            Omeprazole (prilosec)            Compazine if needed            Propranolol (inderal)venlafaxine (effexor)             7 days prior to surgery STOP taking any Aspirin (unless otherwise instructed by your surgeon), Aleve, Naproxen, Ibuprofen, Motrin, Advil, Goody's, BC's, all herbal medications, fish oil, and all vitamins.    Do not wear jewelry, make-up or nail polish.  Do not wear lotions, powders, or perfumes, or deodorant.  Do not shave 48 hours prior to surgery.  Men may shave face and neck.  Do not bring valuables to the hospital.  [facility] is not responsible for any belongings or valuables.  Contacts,  dentures or bridgework may not be worn into surgery.  Leave your suitcase in the car.  After surgery it may be brought to your room.  For patients admitted to the hospital, discharge time will be determined by your treatment team.  Patients discharged the day of surgery will not be allowed to drive home.    Special instructions:   [facility]- Preparing For Surgery  Before surgery, you can play an important role. Because skin is not sterile, your skin needs to be as free of germs as possible. You can reduce the number of germs on your skin by washing with CHG (chlorahexidine gluconate) Soap before surgery.  CHG is an antiseptic cleaner which kills germs and bonds with the skin to continue killing germs even after washing.    Oral Hygiene is also important to reduce your risk of infection.  Remember - BRUSH YOUR TEETH THE MORNING OF SURGERY WITH YOUR REGULAR TOOTHPASTE  Please do not use if you have an allergy to CHG or antibacterial  soaps. If your skin becomes reddened/irritated stop using the CHG.  Do not shave (including legs and underarms) for at least 48 hours prior to first CHG shower. It is OK to shave your face.  Please follow these instructions carefully.   1. Shower the NIGHT BEFORE SURGERY and the MORNING OF SURGERY with CHG.   2. If you chose to wash your hair, wash your hair first as usual with your normal shampoo.  3. After you shampoo, rinse your hair and body thoroughly to remove the shampoo.  4. Use CHG as you would any other liquid soap. You can apply CHG directly to the skin and wash gently with a scrungie or a clean washcloth.   5. Apply the CHG Soap to your body ONLY FROM THE NECK DOWN.  Do not use on open wounds or open sores. Avoid contact with your eyes, ears, mouth and genitals (private parts). Wash Face and genitals (private parts)  with your normal soap.  6. Wash thoroughly, paying special attention to the area where your surgery will be  performed.  7. Thoroughly rinse your body with warm water from the neck down.  8. DO NOT shower/wash with your normal soap after using and rinsing off the CHG Soap.  9. Pat yourself dry with a CLEAN TOWEL.  10. Wear CLEAN PAJAMAS to bed the night before surgery, wear comfortable clothes the morning of surgery  11. Place CLEAN SHEETS on your bed the night of your first shower and DO NOT SLEEP WITH PETS.    Day of Surgery:  Do not apply any deodorants/lotions.  Please wear clean clothes to the hospital/surgery center.   Remember to brush your teeth WITH YOUR REGULAR TOOTHPASTE.    Please read over the following fact sheets that you were given. Coughing and Deep Breathing and Surgical Site Infection Prevention

## 2018-07-30 ENCOUNTER — Telehealth: Payer: Self-pay | Admitting: *Deleted

## 2018-07-30 ENCOUNTER — Encounter (HOSPITAL_COMMUNITY)
Admission: RE | Admit: 2018-07-30 | Discharge: 2018-07-30 | Disposition: A | Discharge status: Home / Self Care | Payer: Medicaid Other | Source: Ambulatory Visit | Attending: Surgery | Admitting: Surgery

## 2018-07-30 ENCOUNTER — Encounter: Payer: Self-pay | Admitting: Physician Assistant

## 2018-07-30 ENCOUNTER — Encounter (HOSPITAL_COMMUNITY): Payer: Self-pay

## 2018-07-30 ENCOUNTER — Other Ambulatory Visit: Payer: Self-pay

## 2018-07-30 ENCOUNTER — Ambulatory Visit: Payer: Medicaid Other

## 2018-07-30 ENCOUNTER — Encounter

## 2018-07-30 ENCOUNTER — Telehealth: Payer: Self-pay | Admitting: Internal Medicine

## 2018-07-30 ENCOUNTER — Ambulatory Visit (INDEPENDENT_AMBULATORY_CARE_PROVIDER_SITE_OTHER): Payer: Medicaid Other | Admitting: Physician Assistant

## 2018-07-30 VITALS — BP 109/71 | HR 72

## 2018-07-30 DIAGNOSIS — Z01812 Encounter for preprocedural laboratory examination: Secondary | ICD-10-CM | POA: Diagnosis present

## 2018-07-30 DIAGNOSIS — F411 Generalized anxiety disorder: Secondary | ICD-10-CM

## 2018-07-30 DIAGNOSIS — G4700 Insomnia, unspecified: Secondary | ICD-10-CM

## 2018-07-30 DIAGNOSIS — F331 Major depressive disorder, recurrent, moderate: Secondary | ICD-10-CM

## 2018-07-30 DIAGNOSIS — D649 Anemia, unspecified: Secondary | ICD-10-CM

## 2018-07-30 HISTORY — DX: Anxiety disorder, unspecified: F41.9

## 2018-07-30 HISTORY — DX: Anemia, unspecified: D64.9

## 2018-07-30 LAB — CBC
HCT: 22 % — ABNORMAL LOW (ref 36.0–46.0)
Hemoglobin: 6.9 g/dL — CL (ref 12.0–15.0)
MCH: 30.8 pg (ref 26.0–34.0)
MCHC: 31.4 g/dL (ref 30.0–36.0)
MCV: 98.2 fL (ref 80.0–100.0)
Platelets: 49 10*3/uL — ABNORMAL LOW (ref 150–400)
RBC: 2.24 MIL/uL — ABNORMAL LOW (ref 3.87–5.11)
RDW: 15.5 % (ref 11.5–15.5)
WBC: 9.1 10*3/uL (ref 4.0–10.5)
nRBC: 0.4 % — ABNORMAL HIGH (ref 0.0–0.2)

## 2018-07-30 LAB — BASIC METABOLIC PANEL
Anion gap: 10 (ref 5–15)
BUN: 21 mg/dL — AB (ref 6–20)
CO2: 23 mmol/L (ref 22–32)
Calcium: 9.1 mg/dL (ref 8.9–10.3)
Chloride: 108 mmol/L (ref 98–111)
Creatinine, Ser: 0.92 mg/dL (ref 0.44–1.00)
GFR calc Af Amer: >60 mL/min (ref >60)
GFR calc non Af Amer: >60 mL/min (ref >60)
Glucose, Bld: 94 mg/dL (ref 70–99)
Potassium: 3.6 mmol/L (ref 3.5–5.1)
Sodium: 141 mmol/L (ref 135–145)

## 2018-07-30 MED ORDER — VENLAFAXINE HCL 75 MG PO TABS: ORAL_TABLET | ORAL | 1 refills | Status: DC

## 2018-07-30 MED ORDER — PROPRANOLOL HCL 40 MG PO TABS: 40.0000 mg | ORAL_TABLET | Freq: Two times a day (BID) | ORAL | 1 refills | Status: DC

## 2018-07-30 MED ORDER — PROPRANOLOL HCL 20 MG PO TABS: 20.0000 mg | ORAL_TABLET | Freq: Three times a day (TID) | ORAL | 0 refills | Status: DC | PRN

## 2018-07-30 MED ORDER — DULOXETINE HCL 30 MG PO CPEP: ORAL_CAPSULE | ORAL | 0 refills | Status: DC

## 2018-07-30 MED FILL — PROPRANOLOL 40 MG TABLET: 40 | 30 days supply | Qty: 60 | Fill #0

## 2018-07-30 NOTE — Telephone Encounter (Signed)
Phone call placed to patient this evening.  I advised her that I received a call about her CBC that revealed that she is anemic with a low platelet count.  Patient states that she has already been called by her nurse from the cancer center and given appointment for first thing Monday morning as she may need a transfusion.  Patient advised that if she has any large bruising, bleeding or dizziness she should be seen in the emergency room.  Otherwise she should keep her appointment for Monday.

## 2018-07-30 NOTE — Telephone Encounter (Signed)
This RN called pt per VM left by pre admission with report of heme level of 6.9 per lab draw today.  Dr Lucia Gaskins ( ordering MD ) was notified and he requested call to this office.  This RN contacted pt and discussed noted drop in labs ( last chemo treatment was 12/4 carbo/gemzar/herceptin ).  Cameshia states mild SOB. No dizziness or fatigue interferring with ADL's though her normal at present is " resting a lot "  Plan per discussion is for pt to come in 12/23 for lab recheck evaluation and possible need for transfusion.  Note Yoneko was tearful on the phone regarding need for blood transfusion " I don't understand why ".  This RN explained to pt above is related to therapy not her cancer. Validation of pt's concerns given.  Nahomy is in agreement to above plan as well as understands to contact on call or proceed to the ER if she feels symptoms are worse over the weekend.

## 2018-07-30 NOTE — Progress Notes (Addendum)
Message left for NP [facility] nurse regarding the abnormal Hgb result.

## 2018-07-30 NOTE — Progress Notes (Addendum)
Carla from Dr. Neoma Laming Johnson's office made aware of abnormal Hgb 6.9. She will relay to the doctor for further instructions.

## 2018-07-30 NOTE — Progress Notes (Addendum)
Dr. Lucia Gaskins made aware of the pt's Hgb 6.9, he suggest to call the pt's pcp and oncologist with the results. Will relay to anesthesia, as well.

## 2018-07-30 NOTE — Telephone Encounter (Signed)
Abigail at [facility] called to say pt was there for pre-op and her hemoglobin is 6.9.  Pt is to have sx 08/12/18. Please alert Karle Plumber her provider.

## 2018-07-30 NOTE — Progress Notes (Signed)
[facility]  Patient ID: Jacqueline Good,  MRN: [id number]  PCP: [name], MD  Date of Evaluation: 07/30/2018 Time spent:45 minutes  Chief Complaint:  Chief Complaint    Depression; Anxiety      HISTORY/CURRENT STATUS: HPI States she's really scared.  Has mastectomy with reconstruction scheduled on Jan 2nd.  "I'm afraid I'm going to hurt a lot and I didn't know they were going to do it all in the same day. I'm scared to death."   Doesn't enjoy anything, wanting to be alone, having mood swings "and they're not very pretty." Cries a lot.  Sleeps ok with the Ambien, but doesn't sleep if she doesn't take it.  "I want to be by myself so I won't be rude and hurting anybody' feelings." Her family hasn't been as supportive as she would have been if it had been them.  That hurts her feelings.  Only her mother and 1 of her brothers has been supportive in the way that she needs. Gets really irritable and aggravated easy.  "My anxiety is through the roof.  When I think about all that have been through and all that I am going to go through I feel like I am going to shake out of my skin.  My heart will race sometimes feel shaky inside.  I am always on edge.  The Xanax does help some."  The chemo hasn't made her sick, except the first couple of treatments.  Now, she has leg weakness from the chemo.  She had a blood transfusion, 2 units.  "I have to admit I feel better after I had it.  But nobody told me the side effects or risks.  And I was scared to death and had a panic attack while I was having the transfusion.  They had to give me something in my IV to help me calm down." She just found out her Hgb is 6.  She's going to need another blood transfusion.  "I am so scared.  I know that people get bad diseases like AIDS and stuff from taking somebody else's blood.  I know I need it but I do not want to have to take it."  Individual Medical History/ Review of Systems: Changes? :Yes  Anemia secondary to chemotherapy, status post blood transfusion of 2 units several weeks ago and will need another transfusion within the next week due to hemoglobin of 6.0, per patient.  Upcoming mastectomy with reconstruction on August 12, 2018  ROS:  Is very tired and gets SOB even walking next door to see her mom.  Denies fevers, chills, night sweats.  HEENT has a sore throat off and on but states that is from crying so much.  Cardiopulmonary she does have palpitations when she gets really anxious or if she walks very far as noted above to visit her mother.  She also has some shortness of breath but only with exertion.  After she had the blood transfusion, she felt better for several weeks.  Now the symptoms have come back.  She denies any syncope or dizziness.  GI she denies abdominal pain but does have symptoms of GERD that are mostly controlled.  No nausea or vomiting constipation or diarrhea.  Genitourinary she denies urinary frequency, dysuria, hematuria.  Neuro she denies any visual changes.  Denies problems with her gait.  Denies tremor or tics.  Musculoskeletal denies any joint pains but does have muscle weakness in her legs for a few days after  the chemotherapy.  Allergies: Gadolinium derivatives and Iodinated diagnostic agents  Current Medications:  Current Outpatient Medications:  .  albuterol (VENTOLIN HFA) 108 (90 Base) MCG/ACT inhaler, INHALE 2 PUFFS INTO THE LUNGS EVERY 6 HOURS AS NEEDED FOR WHEEZING (Patient taking differently: Inhale 2 puffs into the lungs every 6 (six) hours as needed for wheezing or shortness of breath. ), Disp: 54 g, Rfl: 3 .  ALPRAZolam (XANAX) 1 MG tablet, Take 1 mg by mouth 3 (three) times daily. for anxiety, Disp: , Rfl: 2 .  busPIRone (BUSPAR) 30 MG tablet, Take 30 mg by mouth 2 (two) times daily., Disp: , Rfl: 2 .  cetirizine-pseudoephedrine (ZYRTEC-D) 5-120 MG tablet, Take 1 tablet by mouth daily., Disp: 15 tablet, Rfl: 0 .  cholestyramine (QUESTRAN) 4 g  packet, Take 1 packet (4 g total) by mouth 2 (two) times daily. (Patient taking differently: Take 4 g by mouth daily as needed. ), Disp: 60 each, Rfl: 12 .  dexamethasone (DECADRON) 4 MG tablet, Take 2 tablets (8 mg total) by mouth 2 (two) times daily. Start the day before Taxotere. Take once the day after, then 2 times a day x 2d., Disp: 30 tablet, Rfl: 1 .  diphenoxylate-atropine (LOMOTIL) 2.5-0.025 MG tablet, Take 1 tablet by mouth 4 (four) times daily as needed for diarrhea or loose stools., Disp: 30 tablet, Rfl: 0 .  fluticasone (FLONASE) 50 MCG/ACT nasal spray, Place 2 sprays into both nostrils daily., Disp: 1 g, Rfl: 0 .  Fluticasone-Salmeterol (ADVAIR DISKUS) 250-50 MCG/DOSE AEPB, INHALE 1 PUFF INTO THE LUNGS 2 TIMES DAILY. (Patient taking differently: Inhale 1 puff into the lungs 2 (two) times daily as needed (wheezing or shortness of breath). ), Disp: 60 each, Rfl: 2 .  lidocaine-prilocaine (EMLA) cream, Apply to affected area once (Patient taking differently: Apply 1 application topically daily as needed (chemo or port flush). Apply to affected area once), Disp: 30 g, Rfl: 3 .  loperamide (IMODIUM) 2 MG capsule, Take 2 mg by mouth as needed for diarrhea or loose stools., Disp: , Rfl:  .  loratadine (CLARITIN) 10 MG tablet, Take 10 mg by mouth daily as needed for allergies., Disp: , Rfl:  .  naproxen (NAPROSYN) 500 MG tablet, Take 1 tablet (500 mg total) by mouth 2 (two) times daily with a meal. (Patient taking differently: Take 500 mg by mouth daily as needed for moderate pain. ), Disp: 60 tablet, Rfl: 3 .  omeprazole (PRILOSEC) 40 MG capsule, Take 1 capsule (40 mg total) by mouth daily., Disp: 30 capsule, Rfl: 2 .  ondansetron (ZOFRAN) 8 MG tablet, Take 1 tablet (8 mg total) by mouth every 8 (eight) hours as needed for nausea or vomiting. Do not take the first three days after chemo, Disp: 20 tablet, Rfl: 0 .  propranolol (INDERAL) 20 MG tablet, Take 1 tablet (20 mg total) by mouth 3 (three)  times daily as needed (palpitations)., Disp: 90 tablet, Rfl: 0 .  zolpidem (AMBIEN) 10 MG tablet, Take 10 mg by mouth at bedtime. , Disp: , Rfl:  .  DULoxetine (CYMBALTA) 30 MG capsule, 3 po qd for 7 days, then 2 po qd for 7 days, then 1 po qd for 7 days then stop., Disp: 42 capsule, Rfl: 0 .  venlafaxine (EFFEXOR) 75 MG tablet, Take 2 qd until 08/07/18 and then start 3 po qd., Disp: 90 tablet, Rfl: 1 Medication Side Effects: none  Family Medical/ Social History: Changes? No  MENTAL HEALTH EXAM:  Blood pressure 109/71,  pulse 72.There is no height or weight on file to calculate BMI.  General Appearance: Casual, Well Groomed and Obese  Eye Contact:  Good  Speech:  Clear and Coherent  Volume:  Normal  Mood:  Depressed  Affect:  Depressed, Tearful and Anxious she cries almost the entire visit but is consolable.  Thought Process:  Goal Directed  Orientation:  Full (Time, Place, and Person)  Thought Content: Logical   Suicidal Thoughts:  No  Homicidal Thoughts:  No  Memory:  WNL  Judgement:  Good  Insight:  Good  Psychomotor Activity:  Normal no tremor or gait disturbance  Concentration:  Concentration: Fair  Recall:  Good  Fund of Knowledge: Good  Language: Good  Assets:  Desire for Improvement  ADL's:  Intact  Cognition: WNL  Prognosis:  Good  Neuro CN II through XII are grossly intact.  Gait is within normal limits.  No tremor is obvious.  Romberg is negative. Musculoskeletal grossly within normal limits.  DIAGNOSES:    ICD-10-CM   1. Generalized anxiety disorder F41.1   2. Major depressive disorder, recurrent episode, moderate (HCC) F33.1   3. Insomnia, unspecified type G47.00     Receiving Psychotherapy: No    RECOMMENDATIONS: I discussed her case with Dr. Clovis Pu.  She was started on Effexor approximately 2 months ago.  I will increase that to a total of 225 mg p.o. daily but will start the wean off of Cymbalta prior to increasing the Effexor.  I explained the detail  instructions to the patient as follows: Cymbalta 30 mg take 3 daily for 1 week then 2 daily for 1 week then 1 daily for 1 week and then stop.  Continue the Effexor 150 mg for the time.  That she is on 90 mg of Cymbalta.  When she gets to the 60 mg of Cymbalta, increase the Effexor to 225 mg which is 3 pills of the 75.  The patient verbalizes understanding.  This was written out on her AVS and will be on the prescription bottle of the Cymbalta 30 mg. We will continue the propranolol 20 mg 3 times daily.  I considered increasing that however with the anemia and the fact that she is having some shortness of breath as well as palpitations secondary to the anemia, I do not think it is a good idea to possibly lower her blood pressure even more.  That may help with the anxiety some but will hold off on that. Continue Xanax 1 mg 3 times daily as needed. Continue BuSpar 30 mg 1 twice daily. Continue Ambien 10 mg 1 nightly as needed. I recommend she see a counselor but she states she does not want to do that at this time. I tried to give her assurance that the risk are low for her to have any kind of illness secondary to a blood transfusion.  I also tried to reassure her that the mastectomy will go well and although there is always a chance something could go wrong, the vast majority of the time everything goes right.  It is rare for something bad to happen. Return in 4 weeks or sooner as needed.   Donnal Moat, PA-C

## 2018-07-30 NOTE — Patient Instructions (Addendum)
STOP Cymbalta 60 mg   WEAN OFF Cymbalta 30mg  as follows: Take 3 daily for 7 days, then Take 2 daily for 7 days, then  Take 1 daily for 7 days and then stop  Effexor 75 mg, continue taking 2 every day until 08/07/18 and then INCREASE to 3 pills daily.  Continue the Buspar, Xanax, Propranolol,  and Ambien like you've been taking them.

## 2018-07-30 NOTE — Telephone Encounter (Signed)
Will forward to pcp

## 2018-07-30 NOTE — Progress Notes (Addendum)
Spoke with Sunday Spillers, RN from Dr. Pollie Friar office, and made her aware of the pt's concerns, to relay to Dr. Lucia Gaskins regarding the surgery and if blood products will be needed.

## 2018-07-30 NOTE — Progress Notes (Addendum)
PCP - Dr. Karle Plumber  Cardiologist - Denies  Chest x-ray - Denies  EKG - Denies  Stress Test - Denies  ECHO - 06/25/18 (E)  Cardiac Cath - Denies  AICD- na PM- na LOOP- na  Sleep Study - Denies CPAP - None  LABS- 07/30/18: CBC, BMP 08/12/18: T/S?  ASA- Denies  Pt expressed her concerns about not signing her consent for surgery due to lack of information. Pt also upset that she sts she was given two blood transfusions last month w/o signing consent, or being explained the risk factors prior to. Pt has no complaints of weakness or dizziness during appointment.   Anesthesia- Yes- abnormal labs  Pt denies having chest pain, sob, or fever at this time. All instructions explained to the pt, with a verbal understanding of the material. Pt agrees to go over the instructions while at home for a better understanding. The opportunity to ask questions was provided.

## 2018-07-30 NOTE — Progress Notes (Addendum)
Spoke with Abigail Butts, RN from Dr. Pollie Friar office to make her aware of the critical Hgb 6.9. per [facility]. She will page Dr. Lucia Gaskins for further orders.

## 2018-07-31 ENCOUNTER — Ambulatory Visit: Payer: Medicaid Other

## 2018-08-02 ENCOUNTER — Other Ambulatory Visit: Payer: Self-pay | Admitting: *Deleted

## 2018-08-02 ENCOUNTER — Inpatient Hospital Stay: Payer: Medicaid Other

## 2018-08-02 ENCOUNTER — Other Ambulatory Visit: Payer: Self-pay

## 2018-08-02 ENCOUNTER — Inpatient Hospital Stay (HOSPITAL_BASED_OUTPATIENT_CLINIC_OR_DEPARTMENT_OTHER): Payer: Medicaid Other | Admitting: Medical

## 2018-08-02 VITALS — BP 111/66 | HR 67 | Temp 98.1°F | Resp 18

## 2018-08-02 DIAGNOSIS — D649 Anemia, unspecified: Secondary | ICD-10-CM

## 2018-08-02 DIAGNOSIS — J45901 Unspecified asthma with (acute) exacerbation: Secondary | ICD-10-CM

## 2018-08-02 DIAGNOSIS — Z5111 Encounter for antineoplastic chemotherapy: Secondary | ICD-10-CM | POA: Diagnosis not present

## 2018-08-02 DIAGNOSIS — Z171 Estrogen receptor negative status [ER-]: Secondary | ICD-10-CM

## 2018-08-02 DIAGNOSIS — C50411 Malignant neoplasm of upper-outer quadrant of right female breast: Secondary | ICD-10-CM

## 2018-08-02 DIAGNOSIS — C9000 Multiple myeloma not having achieved remission: Secondary | ICD-10-CM

## 2018-08-02 DIAGNOSIS — J441 Chronic obstructive pulmonary disease with (acute) exacerbation: Secondary | ICD-10-CM

## 2018-08-02 LAB — CBC WITH DIFFERENTIAL/PLATELET
Abs Immature Granulocytes: 0.04 10*3/uL (ref 0.00–0.07)
Basophils Absolute: 0 10*3/uL (ref 0.0–0.1)
Basophils Relative: 0 %
Eosinophils Absolute: 0 10*3/uL (ref 0.0–0.5)
Eosinophils Relative: 1 %
HCT: 23.9 % — ABNORMAL LOW (ref 36.0–46.0)
Hemoglobin: 7.5 g/dL — ABNORMAL LOW (ref 12.0–15.0)
Immature Granulocytes: 1 %
Lymphocytes Relative: 20 %
Lymphs Abs: 1.3 10*3/uL (ref 0.7–4.0)
MCH: 31 pg (ref 26.0–34.0)
MCHC: 31.4 g/dL (ref 30.0–36.0)
MCV: 98.8 fL (ref 80.0–100.0)
Monocytes Absolute: 0.7 10*3/uL (ref 0.1–1.0)
Monocytes Relative: 10 %
Neutro Abs: 4.5 10*3/uL (ref 1.7–7.7)
Neutrophils Relative %: 68 %
Platelets: 90 10*3/uL — ABNORMAL LOW (ref 150–400)
RBC: 2.42 MIL/uL — ABNORMAL LOW (ref 3.87–5.11)
RDW: 18.6 % — ABNORMAL HIGH (ref 11.5–15.5)
WBC: 6.6 10*3/uL (ref 4.0–10.5)
nRBC: 0 % (ref 0.0–0.2)

## 2018-08-02 LAB — COMPREHENSIVE METABOLIC PANEL
ALT: 35 U/L (ref 0–44)
AST: 23 U/L (ref 15–41)
Albumin: 4 g/dL (ref 3.5–5.0)
Alkaline Phosphatase: 129 U/L — ABNORMAL HIGH (ref 38–126)
Anion gap: 9 (ref 5–15)
BUN: 18 mg/dL (ref 6–20)
CO2: 23 mmol/L (ref 22–32)
Calcium: 9.2 mg/dL (ref 8.9–10.3)
Chloride: 108 mmol/L (ref 98–111)
Creatinine, Ser: 0.89 mg/dL (ref 0.44–1.00)
GFR calc Af Amer: >60 mL/min (ref >60)
GFR calc non Af Amer: >60 mL/min (ref >60)
Glucose, Bld: 100 mg/dL — ABNORMAL HIGH (ref 70–99)
Potassium: 3.8 mmol/L (ref 3.5–5.1)
SODIUM: 140 mmol/L (ref 135–145)
Total Bilirubin: 0.5 mg/dL (ref 0.3–1.2)
Total Protein: 7.2 g/dL (ref 6.5–8.1)

## 2018-08-02 LAB — PREPARE RBC (CROSSMATCH)

## 2018-08-02 MED ORDER — SODIUM CHLORIDE 0.9% IV SOLUTION
250.0000 mL | Freq: Once | INTRAVENOUS | Status: DC
  Filled 2018-08-02: qty 250

## 2018-08-02 MED ORDER — DIPHENHYDRAMINE HCL 25 MG PO CAPS
ORAL_CAPSULE | ORAL | Status: AC
  Filled 2018-08-02: qty 1

## 2018-08-02 MED ORDER — ACETAMINOPHEN 325 MG PO TABS: 650.0000 mg | ORAL_TABLET | Freq: Once | ORAL | Status: DC

## 2018-08-02 MED ORDER — SODIUM CHLORIDE 0.9% FLUSH
10.0000 mL | INTRAVENOUS | Status: DC | PRN
  Filled 2018-08-02: qty 10

## 2018-08-02 MED ORDER — DIPHENHYDRAMINE HCL 25 MG PO CAPS: 25.0000 mg | ORAL_CAPSULE | Freq: Once | ORAL | Status: DC

## 2018-08-02 MED ORDER — SODIUM CHLORIDE 0.9 % IV SOLN
INTRAVENOUS | Status: DC
  Administered 2018-08-02: 11:00:00 via INTRAVENOUS
  Filled 2018-08-02: qty 250

## 2018-08-02 MED ORDER — DIPHENHYDRAMINE HCL 25 MG PO CAPS
25.0000 mg | ORAL_CAPSULE | Freq: Once | ORAL | Status: AC
  Administered 2018-08-02: 25 mg via ORAL

## 2018-08-02 MED ORDER — ACETAMINOPHEN 325 MG PO TABS
ORAL_TABLET | ORAL | Status: AC
  Filled 2018-08-02: qty 2

## 2018-08-02 MED ORDER — ACETAMINOPHEN 325 MG PO TABS
650.0000 mg | ORAL_TABLET | Freq: Once | ORAL | Status: AC
  Administered 2018-08-02: 650 mg via ORAL

## 2018-08-02 MED ORDER — HEPARIN SOD (PORK) LOCK FLUSH 100 UNIT/ML IV SOLN
250.0000 [IU] | INTRAVENOUS | Status: DC | PRN
  Filled 2018-08-02: qty 5

## 2018-08-02 MED ORDER — HEPARIN SOD (PORK) LOCK FLUSH 100 UNIT/ML IV SOLN
500.0000 [IU] | Freq: Every day | INTRAVENOUS | Status: DC | PRN
  Filled 2018-08-02: qty 5

## 2018-08-02 MED ORDER — SODIUM CHLORIDE 0.9% FLUSH
3.0000 mL | INTRAVENOUS | Status: DC | PRN
  Filled 2018-08-02: qty 10

## 2018-08-02 NOTE — Progress Notes (Signed)
PT to receive 1 unit PRBCs today and 1 unit tomorrow per PA Lucianne Lei.  Orders in place per RN Val.

## 2018-08-02 NOTE — Patient Instructions (Signed)

## 2018-08-02 NOTE — Patient Instructions (Signed)

## 2018-08-03 ENCOUNTER — Other Ambulatory Visit: Payer: Self-pay | Admitting: Emergency Medicine

## 2018-08-03 ENCOUNTER — Ambulatory Visit (INDEPENDENT_AMBULATORY_CARE_PROVIDER_SITE_OTHER): Payer: Medicaid Other | Admitting: Plastic Surgery

## 2018-08-03 ENCOUNTER — Inpatient Hospital Stay: Payer: Medicaid Other

## 2018-08-03 ENCOUNTER — Encounter: Payer: Self-pay | Admitting: Plastic Surgery

## 2018-08-03 VITALS — BP 117/72 | HR 79 | Ht 64.0 in | Wt 198.0 lb

## 2018-08-03 DIAGNOSIS — C50411 Malignant neoplasm of upper-outer quadrant of right female breast: Secondary | ICD-10-CM

## 2018-08-03 DIAGNOSIS — F331 Major depressive disorder, recurrent, moderate: Secondary | ICD-10-CM

## 2018-08-03 DIAGNOSIS — G8929 Other chronic pain: Secondary | ICD-10-CM

## 2018-08-03 DIAGNOSIS — Z95828 Presence of other vascular implants and grafts: Secondary | ICD-10-CM

## 2018-08-03 DIAGNOSIS — D649 Anemia, unspecified: Secondary | ICD-10-CM

## 2018-08-03 DIAGNOSIS — Z171 Estrogen receptor negative status [ER-]: Secondary | ICD-10-CM

## 2018-08-03 DIAGNOSIS — M545 Low back pain: Secondary | ICD-10-CM

## 2018-08-03 DIAGNOSIS — Z5111 Encounter for antineoplastic chemotherapy: Secondary | ICD-10-CM | POA: Diagnosis not present

## 2018-08-03 MED ORDER — HYDROCODONE-ACETAMINOPHEN 5-325 MG PO TABS: 2.0000 | ORAL_TABLET | Freq: Four times a day (QID) | ORAL | 0 refills | Status: DC | PRN

## 2018-08-03 MED ORDER — ACETAMINOPHEN 325 MG PO TABS
ORAL_TABLET | ORAL | Status: AC
  Filled 2018-08-03: qty 2

## 2018-08-03 MED ORDER — DIAZEPAM 2 MG PO TABS: 2.0000 mg | ORAL_TABLET | Freq: Three times a day (TID) | ORAL | 0 refills | Status: AC | PRN

## 2018-08-03 MED ORDER — CEPHALEXIN 500 MG PO CAPS: 500.0000 mg | ORAL_CAPSULE | Freq: Four times a day (QID) | ORAL | 0 refills | Status: DC

## 2018-08-03 MED ORDER — DIPHENHYDRAMINE HCL 25 MG PO CAPS
ORAL_CAPSULE | ORAL | Status: AC
  Filled 2018-08-03: qty 1

## 2018-08-03 MED ORDER — ONDANSETRON HCL 4 MG PO TABS: 4.0000 mg | ORAL_TABLET | Freq: Every day | ORAL | 0 refills | Status: AC | PRN

## 2018-08-03 MED ORDER — HEPARIN SOD (PORK) LOCK FLUSH 100 UNIT/ML IV SOLN
500.0000 [IU] | Freq: Every day | INTRAVENOUS | Status: AC | PRN
  Administered 2018-08-03: 500 [IU]
  Filled 2018-08-03: qty 5

## 2018-08-03 MED ORDER — ACETAMINOPHEN 325 MG PO TABS
650.0000 mg | ORAL_TABLET | Freq: Once | ORAL | Status: AC
  Administered 2018-08-03: 650 mg via ORAL

## 2018-08-03 MED ORDER — DIPHENHYDRAMINE HCL 25 MG PO CAPS
25.0000 mg | ORAL_CAPSULE | Freq: Once | ORAL | Status: AC
  Administered 2018-08-03: 25 mg via ORAL

## 2018-08-03 MED ORDER — ONDANSETRON HCL 4 MG PO TABS: 4.0000 mg | ORAL_TABLET | Freq: Every day | ORAL | 0 refills | Status: DC | PRN

## 2018-08-03 MED ORDER — SODIUM CHLORIDE 0.9% FLUSH
10.0000 mL | INTRAVENOUS | Status: AC | PRN
  Administered 2018-08-03: 10 mL
  Filled 2018-08-03: qty 10

## 2018-08-03 MED ORDER — SODIUM CHLORIDE 0.9% IV SOLUTION
250.0000 mL | Freq: Once | INTRAVENOUS | Status: AC
  Administered 2018-08-03: 250 mL via INTRAVENOUS
  Filled 2018-08-03: qty 250

## 2018-08-03 MED ORDER — DIAZEPAM 2 MG PO TABS: 2.0000 mg | ORAL_TABLET | Freq: Four times a day (QID) | ORAL | 0 refills | Status: DC | PRN

## 2018-08-03 MED FILL — ONDANSETRON HCL 4 MG TABLET: 4 | 5 days supply | Qty: 5 | Fill #0

## 2018-08-03 MED FILL — CEPHALEXIN 500 MG CAPSULE: 500 | 5 days supply | Qty: 20 | Fill #0

## 2018-08-03 MED FILL — HYDROCODON-APAP 5-325: 5-325 | 1 days supply | Qty: 6 | Fill #0

## 2018-08-03 MED FILL — diazePAM 2 MG TABS: 2 | 5 days supply | Qty: 15 | Fill #0

## 2018-08-03 NOTE — H&P (View-Only) (Signed)
Patient ID: [name], female    DOB: [birth year], 57 y.o.   MRN: [id number]   Chief Complaint  Patient presents with  . Breast Problem    The patient is a 57 year old black female here for her history and physical.  She is here with her mom.  She is planning on having a right mastectomy with immediate reconstruction.  She was diagnosed with estrogen negative HER-2 positive right breast cancer.  She has already been on chemotherapy and has a Port-A-Cath in place.  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  She is 5 feet 4 inches tall, weight 206 pounds.  Preop bra 42 DDD.  Not have any recent illnesses and is ready for the surgery.    Review of Systems  Constitutional: Negative for activity change and appetite change.  HENT: Negative.   Eyes: Negative.   Respiratory: Negative.   Cardiovascular: Negative.   Gastrointestinal: Negative.   Endocrine: Negative.   Genitourinary: Negative.   Musculoskeletal: Positive for back pain.  Skin: Negative.  Negative for color change and wound.  Neurological: Negative.   Hematological: Negative.   Psychiatric/Behavioral: Negative.     Past Medical History:  Diagnosis Date  . Anemia   . Anxiety   . Arthritis   . Asthma   . breast ca dx'd 02/2018   right  . Depression     Past Surgical History:  Procedure Laterality Date  . DILATION AND CURETTAGE OF UTERUS    . endometrial ablation    . IR IMAGING GUIDED PORT INSERTION  03/18/2018  . MYOMECTOMY        Current Outpatient Medications:  .  albuterol (VENTOLIN HFA) 108 (90 Base) MCG/ACT inhaler, INHALE 2 PUFFS INTO THE LUNGS EVERY 6 HOURS AS NEEDED FOR WHEEZING (Patient taking differently: Inhale 2 puffs into the lungs every 6 (six) hours as needed for wheezing or shortness of breath. ), Disp: 54 g, Rfl: 3 .  ALPRAZolam (XANAX) 1 MG tablet, Take 1 mg by mouth 3 (three) times daily. for anxiety, Disp: , Rfl: 2 .  busPIRone (BUSPAR) 30 MG tablet, Take 30 mg by mouth 2  (two) times daily., Disp: , Rfl: 2 .  cetirizine-pseudoephedrine (ZYRTEC-D) 5-120 MG tablet, Take 1 tablet by mouth daily., Disp: 15 tablet, Rfl: 0 .  cholestyramine (QUESTRAN) 4 g packet, Take 1 packet (4 g total) by mouth 2 (two) times daily. (Patient taking differently: Take 4 g by mouth daily as needed. ), Disp: 60 each, Rfl: 12 .  dexamethasone (DECADRON) 4 MG tablet, Take 2 tablets (8 mg total) by mouth 2 (two) times daily. Start the day before Taxotere. Take once the day after, then 2 times a day x 2d., Disp: 30 tablet, Rfl: 1 .  diphenoxylate-atropine (LOMOTIL) 2.5-0.025 MG tablet, Take 1 tablet by mouth 4 (four) times daily as needed for diarrhea or loose stools., Disp: 30 tablet, Rfl: 0 .  DULoxetine (CYMBALTA) 30 MG capsule, 3 po qd for 7 days, then 2 po qd for 7 days, then 1 po qd for 7 days then stop., Disp: 42 capsule, Rfl: 0 .  fluticasone (FLONASE) 50 MCG/ACT nasal spray, Place 2 sprays into both nostrils daily., Disp: 1 g, Rfl: 0 .  Fluticasone-Salmeterol (ADVAIR DISKUS) 250-50 MCG/DOSE AEPB, INHALE 1 PUFF INTO THE LUNGS 2 TIMES DAILY. (Patient taking differently: Inhale 1 puff into the lungs 2 (two) times daily as needed (wheezing or shortness of breath). ), Disp: 60 each, Rfl: 2 .  lidocaine-prilocaine (EMLA) cream, Apply to affected area once (Patient taking differently: Apply 1 application topically daily as needed (chemo or port flush). Apply to affected area once), Disp: 30 g, Rfl: 3 .  loperamide (IMODIUM) 2 MG capsule, Take 2 mg by mouth as needed for diarrhea or loose stools., Disp: , Rfl:  .  loratadine (CLARITIN) 10 MG tablet, Take 10 mg by mouth daily as needed for allergies., Disp: , Rfl:  .  naproxen (NAPROSYN) 500 MG tablet, Take 1 tablet (500 mg total) by mouth 2 (two) times daily with a meal. (Patient taking differently: Take 500 mg by mouth daily as needed for moderate pain. ), Disp: 60 tablet, Rfl: 3 .  omeprazole (PRILOSEC) 40 MG capsule, Take 1 capsule (40 mg total)  by mouth daily., Disp: 30 capsule, Rfl: 2 .  ondansetron (ZOFRAN) 8 MG tablet, Take 1 tablet (8 mg total) by mouth every 8 (eight) hours as needed for nausea or vomiting. Do not take the first three days after chemo, Disp: 20 tablet, Rfl: 0 .  propranolol (INDERAL) 20 MG tablet, Take 1 tablet (20 mg total) by mouth 3 (three) times daily as needed (palpitations)., Disp: 90 tablet, Rfl: 0 .  venlafaxine (EFFEXOR) 75 MG tablet, Take 2 qd until 08/07/18 and then start 3 po qd., Disp: 90 tablet, Rfl: 1 .  zolpidem (AMBIEN) 10 MG tablet, Take 10 mg by mouth at bedtime. , Disp: , Rfl:    Objective:   Vitals:   08/03/18 1127  BP: 117/72  Pulse: 79  SpO2: 96%    Physical Exam Vitals signs and nursing note reviewed.  Constitutional:      Appearance: Normal appearance.  HENT:     Head: Normocephalic and atraumatic.     Mouth/Throat:     Mouth: Mucous membranes are moist.  Eyes:     Extraocular Movements: Extraocular movements intact.  Neck:     Musculoskeletal: Normal range of motion.  Cardiovascular:     Rate and Rhythm: Normal rate.  Pulmonary:     Effort: Pulmonary effort is normal.  Abdominal:     General: Abdomen is flat.  Skin:    General: Skin is warm.  Neurological:     General: No focal deficit present.     Mental Status: She is alert.  Psychiatric:        Mood and Affect: Mood normal.        Thought Content: Thought content normal.        Judgment: Judgment normal.     Assessment & Plan:  Major depressive disorder, recurrent episode, moderate (HCC)  Malignant neoplasm of upper-outer quadrant of right breast in female, estrogen receptor negative ([place])  Chronic bilateral low back pain without sciatica  Port-A-Cath in place  Plan for immediate breast reconstruction with expander and flexHD.  We discussed the change in plan that may be needed if she is radiated.  We will try to exchange the expander to an implant prior to radiation.  If not a latissimus will be  needed.  The risks that can be encountered with and after placement of a breast expander placement were discussed and include the following but not limited to these: bleeding, infection, delayed healing, anesthesia risks, skin sensation changes, injury to structures including nerves, blood vessels, and muscles which may be temporary or permanent, allergies to tape, suture materials and glues, blood products, topical preparations or injected agents, skin contour irregularities, skin discoloration and swelling, deep vein thrombosis, cardiac and pulmonary complications,  pain, which may persist, fluid accumulation, wrinkling of the skin over the expander, changes in nipple or breast sensation, expander leakage or rupture, faulty position of the expander, persistent pain, formation of tight scar tissue around the expander (capsular contracture), possible need for revisional surgery or staged procedures.   [place], DO

## 2018-08-03 NOTE — Addendum Note (Signed)
Addended by: Wallace Going on: 08/03/2018 12:13 PM   Modules accepted: Orders

## 2018-08-03 NOTE — Progress Notes (Signed)
Patient ID: Jacqueline Good, female    DOB: [birth year], 57 y.o.   MRN: [id number]   Chief Complaint  Patient presents with  . Breast Problem    The patient is a 57 year old black female here for her history and physical.  She is here with her mom.  She is planning on having a right mastectomy with immediate reconstruction.  She was diagnosed with estrogen negative HER-2 positive right breast cancer.  She has already been on chemotherapy and has a Port-A-Cath in place.  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  She is 5 feet 4 inches tall, weight 206 pounds.  Preop bra 42 DDD.  Not have any recent illnesses and is ready for the surgery.    Review of Systems  Constitutional: Negative for activity change and appetite change.  HENT: Negative.   Eyes: Negative.   Respiratory: Negative.   Cardiovascular: Negative.   Gastrointestinal: Negative.   Endocrine: Negative.   Genitourinary: Negative.   Musculoskeletal: Positive for back pain.  Skin: Negative.  Negative for color change and wound.  Neurological: Negative.   Hematological: Negative.   Psychiatric/Behavioral: Negative.     Past Medical History:  Diagnosis Date  . Anemia   . Anxiety   . Arthritis   . Asthma   . breast ca dx'd 02/2018   right  . Depression     Past Surgical History:  Procedure Laterality Date  . DILATION AND CURETTAGE OF UTERUS    . endometrial ablation    . IR IMAGING GUIDED PORT INSERTION  03/18/2018  . MYOMECTOMY        Current Outpatient Medications:  .  albuterol (VENTOLIN HFA) 108 (90 Base) MCG/ACT inhaler, INHALE 2 PUFFS INTO THE LUNGS EVERY 6 HOURS AS NEEDED FOR WHEEZING (Patient taking differently: Inhale 2 puffs into the lungs every 6 (six) hours as needed for wheezing or shortness of breath. ), Disp: 54 g, Rfl: 3 .  ALPRAZolam (XANAX) 1 MG tablet, Take 1 mg by mouth 3 (three) times daily. for anxiety, Disp: , Rfl: 2 .  busPIRone (BUSPAR) 30 MG tablet, Take 30 mg by mouth 2  (two) times daily., Disp: , Rfl: 2 .  cetirizine-pseudoephedrine (ZYRTEC-D) 5-120 MG tablet, Take 1 tablet by mouth daily., Disp: 15 tablet, Rfl: 0 .  cholestyramine (QUESTRAN) 4 g packet, Take 1 packet (4 g total) by mouth 2 (two) times daily. (Patient taking differently: Take 4 g by mouth daily as needed. ), Disp: 60 each, Rfl: 12 .  dexamethasone (DECADRON) 4 MG tablet, Take 2 tablets (8 mg total) by mouth 2 (two) times daily. Start the day before Taxotere. Take once the day after, then 2 times a day x 2d., Disp: 30 tablet, Rfl: 1 .  diphenoxylate-atropine (LOMOTIL) 2.5-0.025 MG tablet, Take 1 tablet by mouth 4 (four) times daily as needed for diarrhea or loose stools., Disp: 30 tablet, Rfl: 0 .  DULoxetine (CYMBALTA) 30 MG capsule, 3 po qd for 7 days, then 2 po qd for 7 days, then 1 po qd for 7 days then stop., Disp: 42 capsule, Rfl: 0 .  fluticasone (FLONASE) 50 MCG/ACT nasal spray, Place 2 sprays into both nostrils daily., Disp: 1 g, Rfl: 0 .  Fluticasone-Salmeterol (ADVAIR DISKUS) 250-50 MCG/DOSE AEPB, INHALE 1 PUFF INTO THE LUNGS 2 TIMES DAILY. (Patient taking differently: Inhale 1 puff into the lungs 2 (two) times daily as needed (wheezing or shortness of breath). ), Disp: 60 each, Rfl: 2 .  lidocaine-prilocaine (EMLA) cream, Apply to affected area once (Patient taking differently: Apply 1 application topically daily as needed (chemo or port flush). Apply to affected area once), Disp: 30 g, Rfl: 3 .  loperamide (IMODIUM) 2 MG capsule, Take 2 mg by mouth as needed for diarrhea or loose stools., Disp: , Rfl:  .  loratadine (CLARITIN) 10 MG tablet, Take 10 mg by mouth daily as needed for allergies., Disp: , Rfl:  .  naproxen (NAPROSYN) 500 MG tablet, Take 1 tablet (500 mg total) by mouth 2 (two) times daily with a meal. (Patient taking differently: Take 500 mg by mouth daily as needed for moderate pain. ), Disp: 60 tablet, Rfl: 3 .  omeprazole (PRILOSEC) 40 MG capsule, Take 1 capsule (40 mg total)  by mouth daily., Disp: 30 capsule, Rfl: 2 .  ondansetron (ZOFRAN) 8 MG tablet, Take 1 tablet (8 mg total) by mouth every 8 (eight) hours as needed for nausea or vomiting. Do not take the first three days after chemo, Disp: 20 tablet, Rfl: 0 .  propranolol (INDERAL) 20 MG tablet, Take 1 tablet (20 mg total) by mouth 3 (three) times daily as needed (palpitations)., Disp: 90 tablet, Rfl: 0 .  venlafaxine (EFFEXOR) 75 MG tablet, Take 2 qd until 08/07/18 and then start 3 po qd., Disp: 90 tablet, Rfl: 1 .  zolpidem (AMBIEN) 10 MG tablet, Take 10 mg by mouth at bedtime. , Disp: , Rfl:    Objective:   Vitals:   08/03/18 1127  BP: 117/72  Pulse: 79  SpO2: 96%    Physical Exam Vitals signs and nursing note reviewed.  Constitutional:      Appearance: Normal appearance.  HENT:     Head: Normocephalic and atraumatic.     Mouth/Throat:     Mouth: Mucous membranes are moist.  Eyes:     Extraocular Movements: Extraocular movements intact.  Neck:     Musculoskeletal: Normal range of motion.  Cardiovascular:     Rate and Rhythm: Normal rate.  Pulmonary:     Effort: Pulmonary effort is normal.  Abdominal:     General: Abdomen is flat.  Skin:    General: Skin is warm.  Neurological:     General: No focal deficit present.     Mental Status: She is alert.  Psychiatric:        Mood and Affect: Mood normal.        Thought Content: Thought content normal.        Judgment: Judgment normal.     Assessment & Plan:  Major depressive disorder, recurrent episode, moderate (HCC)  Malignant neoplasm of upper-outer quadrant of right breast in female, estrogen receptor negative ([place])  Chronic bilateral low back pain without sciatica  Port-A-Cath in place  Plan for immediate breast reconstruction with expander and flexHD.  We discussed the change in plan that may be needed if she is radiated.  We will try to exchange the expander to an implant prior to radiation.  If not a latissimus will be  needed.  The risks that can be encountered with and after placement of a breast expander placement were discussed and include the following but not limited to these: bleeding, infection, delayed healing, anesthesia risks, skin sensation changes, injury to structures including nerves, blood vessels, and muscles which may be temporary or permanent, allergies to tape, suture materials and glues, blood products, topical preparations or injected agents, skin contour irregularities, skin discoloration and swelling, deep vein thrombosis, cardiac and pulmonary complications,  pain, which may persist, fluid accumulation, wrinkling of the skin over the expander, changes in nipple or breast sensation, expander leakage or rupture, faulty position of the expander, persistent pain, formation of tight scar tissue around the expander (capsular contracture), possible need for revisional surgery or staged procedures.   [place], DO

## 2018-08-03 NOTE — Patient Instructions (Signed)

## 2018-08-03 NOTE — Progress Notes (Signed)
Jacqueline Good was seen briefly.  She had a CBC completed which showed a hemoglobin of 7.5 and a platelet count of 90.  Dr. Jana Hakim is ordered 2 units of packed red blood cells for her.  She has a surgery coming up soon.  She was initially hesitant to have her transfusion but has agreed as she is aware that she could have her surgery canceled due to her anemia.  Due to time constraints on the patient's part, she will receive 1 unit of packed red blood cells today and 1 unit of packed red blood cells tomorrow.  She reports progressive shortness of breath and fatigue.  Sandi Mealy, MHS, PA-C Physician Assistant

## 2018-08-04 LAB — TYPE AND SCREEN
ABO/RH(D): O POS
Antibody Screen: NEGATIVE
Unit division: 0
Unit division: 0

## 2018-08-04 LAB — BPAM RBC
Blood Product Expiration Date: 202001202359
Blood Product Expiration Date: 202001202359
ISSUE DATE / TIME: 201912231050
ISSUE DATE / TIME: 201912241339
Unit Type and Rh: 5100
Unit Type and Rh: 5100

## 2018-08-06 ENCOUNTER — Encounter: Payer: Self-pay | Admitting: Adult Health

## 2018-08-06 ENCOUNTER — Encounter: Payer: Self-pay | Admitting: Hematology and Oncology

## 2018-08-06 ENCOUNTER — Inpatient Hospital Stay: Payer: Medicaid Other

## 2018-08-06 ENCOUNTER — Inpatient Hospital Stay (HOSPITAL_BASED_OUTPATIENT_CLINIC_OR_DEPARTMENT_OTHER): Payer: Medicaid Other | Admitting: Adult Health

## 2018-08-06 ENCOUNTER — Other Ambulatory Visit: Payer: Self-pay | Admitting: Hematology and Oncology

## 2018-08-06 VITALS — BP 134/69 | HR 61 | Temp 97.9°F | Resp 16 | Ht 64.0 in | Wt 198.8 lb

## 2018-08-06 DIAGNOSIS — Z95828 Presence of other vascular implants and grafts: Secondary | ICD-10-CM

## 2018-08-06 DIAGNOSIS — Z171 Estrogen receptor negative status [ER-]: Secondary | ICD-10-CM | POA: Diagnosis not present

## 2018-08-06 DIAGNOSIS — C50411 Malignant neoplasm of upper-outer quadrant of right female breast: Secondary | ICD-10-CM

## 2018-08-06 DIAGNOSIS — Z87891 Personal history of nicotine dependence: Secondary | ICD-10-CM | POA: Diagnosis not present

## 2018-08-06 DIAGNOSIS — Z5111 Encounter for antineoplastic chemotherapy: Secondary | ICD-10-CM | POA: Diagnosis not present

## 2018-08-06 DIAGNOSIS — D3502 Benign neoplasm of left adrenal gland: Secondary | ICD-10-CM

## 2018-08-06 DIAGNOSIS — Z803 Family history of malignant neoplasm of breast: Secondary | ICD-10-CM

## 2018-08-06 DIAGNOSIS — J441 Chronic obstructive pulmonary disease with (acute) exacerbation: Secondary | ICD-10-CM

## 2018-08-06 DIAGNOSIS — J45901 Unspecified asthma with (acute) exacerbation: Secondary | ICD-10-CM

## 2018-08-06 DIAGNOSIS — Z8041 Family history of malignant neoplasm of ovary: Secondary | ICD-10-CM

## 2018-08-06 LAB — CBC WITH DIFFERENTIAL/PLATELET
Abs Immature Granulocytes: 0.02 10*3/uL (ref 0.00–0.07)
Basophils Absolute: 0 10*3/uL (ref 0.0–0.1)
Basophils Relative: 0 %
Eosinophils Absolute: 0 10*3/uL (ref 0.0–0.5)
Eosinophils Relative: 1 %
HCT: 32.4 % — ABNORMAL LOW (ref 36.0–46.0)
Hemoglobin: 10.4 g/dL — ABNORMAL LOW (ref 12.0–15.0)
IMMATURE GRANULOCYTES: 0 %
LYMPHS ABS: 1.1 10*3/uL (ref 0.7–4.0)
Lymphocytes Relative: 18 %
MCH: 30.5 pg (ref 26.0–34.0)
MCHC: 32.1 g/dL (ref 30.0–36.0)
MCV: 95 fL (ref 80.0–100.0)
Monocytes Absolute: 0.5 10*3/uL (ref 0.1–1.0)
Monocytes Relative: 9 %
Neutro Abs: 4.3 10*3/uL (ref 1.7–7.7)
Neutrophils Relative %: 72 %
Platelets: 134 10*3/uL — ABNORMAL LOW (ref 150–400)
RBC: 3.41 MIL/uL — ABNORMAL LOW (ref 3.87–5.11)
RDW: 18.6 % — ABNORMAL HIGH (ref 11.5–15.5)
WBC: 6 10*3/uL (ref 4.0–10.5)
nRBC: 0 % (ref 0.0–0.2)

## 2018-08-06 LAB — COMPREHENSIVE METABOLIC PANEL
ALBUMIN: 4.1 g/dL (ref 3.5–5.0)
ALT: 36 U/L (ref 0–44)
AST: 23 U/L (ref 15–41)
Alkaline Phosphatase: 117 U/L (ref 38–126)
Anion gap: 8 (ref 5–15)
BUN: 19 mg/dL (ref 6–20)
CHLORIDE: 109 mmol/L (ref 98–111)
CO2: 23 mmol/L (ref 22–32)
Calcium: 9.3 mg/dL (ref 8.9–10.3)
Creatinine, Ser: 0.89 mg/dL (ref 0.44–1.00)
GFR calc Af Amer: >60 mL/min (ref >60)
GFR calc non Af Amer: >60 mL/min (ref >60)
GLUCOSE: 87 mg/dL (ref 70–99)
Potassium: 3.8 mmol/L (ref 3.5–5.1)
SODIUM: 140 mmol/L (ref 135–145)
Total Bilirubin: 0.7 mg/dL (ref 0.3–1.2)
Total Protein: 7.3 g/dL (ref 6.5–8.1)

## 2018-08-06 MED ORDER — ACETAMINOPHEN 325 MG PO TABS
650.0000 mg | ORAL_TABLET | Freq: Once | ORAL | Status: AC
  Administered 2018-08-06: 650 mg via ORAL

## 2018-08-06 MED ORDER — HEPARIN SOD (PORK) LOCK FLUSH 100 UNIT/ML IV SOLN
500.0000 [IU] | Freq: Once | INTRAVENOUS | Status: AC | PRN
  Administered 2018-08-06: 500 [IU]
  Filled 2018-08-06: qty 5

## 2018-08-06 MED ORDER — SODIUM CHLORIDE 0.9% FLUSH
10.0000 mL | INTRAVENOUS | Status: DC | PRN
  Administered 2018-08-06: 10 mL
  Filled 2018-08-06: qty 10

## 2018-08-06 MED ORDER — SODIUM CHLORIDE 0.9 % IV SOLN
Freq: Once | INTRAVENOUS | Status: AC
  Administered 2018-08-06: 13:00:00 via INTRAVENOUS
  Filled 2018-08-06: qty 250

## 2018-08-06 MED ORDER — TRASTUZUMAB CHEMO 150 MG IV SOLR
600.0000 mg | Freq: Once | INTRAVENOUS | Status: AC
  Administered 2018-08-06: 600 mg via INTRAVENOUS
  Filled 2018-08-06: qty 28.57

## 2018-08-06 MED ORDER — ACETAMINOPHEN 325 MG PO TABS
ORAL_TABLET | ORAL | Status: AC
  Filled 2018-08-06: qty 2

## 2018-08-06 MED ORDER — SODIUM CHLORIDE 0.9% FLUSH
10.0000 mL | Freq: Once | INTRAVENOUS | Status: AC
  Administered 2018-08-06: 10 mL
  Filled 2018-08-06: qty 10

## 2018-08-06 MED ORDER — DIPHENHYDRAMINE HCL 25 MG PO CAPS
25.0000 mg | ORAL_CAPSULE | Freq: Once | ORAL | Status: AC
  Administered 2018-08-06: 25 mg via ORAL

## 2018-08-06 MED ORDER — DIPHENHYDRAMINE HCL 25 MG PO CAPS
ORAL_CAPSULE | ORAL | Status: AC
  Filled 2018-08-06: qty 1

## 2018-08-06 NOTE — Progress Notes (Signed)
[place]  [phone] [phone]     ID: Jacqueline Good DOB: 1960/10/27  MR#: [id number]  TFT#:[id number]  Patient Care Team: [name], MD as PCP - General (Internal Medicine) Alphonsa Overall, MD as Consulting Physician (General Surgery) [name], [name], MD as Consulting Physician (Oncology) Eppie Gibson, MD as Attending Physician (Radiation Oncology) OTHER MD: Donnal Moat, PA-C [FAX [phone]]  CHIEF COMPLAINT: Estrogen receptor negative breast cancer  CURRENT TREATMENT: Neoadjuvant chemoimmunotherapy  INTERVAL HISTORY: Jacqueline Good returns today for follow up of her estrogen receptor negative but HER-2 positive breast cancer accompanied by her mom.  She is feeling chilly today.  She is due to receive her Trastuzumab today.  She has completed her 6 cycles of neoadjuvant chemotherapy/immunotherapy.  She is scheduled for surgery on 08/12/2018.  She notes being confused about surgery.    REVIEW OF SYSTEMS: Jacqueline Good received 2 units of blood last week.  She is feeling better today.  She denies any fevers, chills, nausea, vomiting, chest pain, palpitations, cough, shortness of breath, bowel or bladder concerns.  A detailed ROS was otherwise non contributory today.    HISTORY OF CURRENT ILLNESS: From the original intake note:  Jacqueline Good had routine screening mammography on 02/05/2018 showing a possible abnormality in the right breast. She underwent unilateral right diagnostic mammography with tomography and right breast ultrasonography at [facility] on 02/15/2018 showing: Highly suspicious right breast mass at 10 o'clock position.  There was a second, 0.6 cm lesion at the 10:00 radiant which has not been biopsied.  Suspicious focal cortical thickening of a single right axillary lymph node.  Accordingly on 02/25/2018 she proceeded to biopsy of the right breast area in question. The pathology from this procedure showed ([id number]):  Breast, right, needle core biopsy, OUQ with microscopic focus of invasive ductal carcinoma, grade 2, arising in a background of high grade ductal carcinoma in situ. 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   The patient's subsequent history is as detailed below.    PAST MEDICAL HISTORY: Past Medical History:  Diagnosis Date  . Anemia   . Anxiety   . Arthritis   . Asthma   . breast ca dx'd 02/2018   right  . Depression     PAST SURGICAL HISTORY: Past Surgical History:  Procedure Laterality Date  . DILATION AND CURETTAGE OF UTERUS    . endometrial ablation    . IR IMAGING GUIDED PORT INSERTION  03/18/2018  . MYOMECTOMY      FAMILY HISTORY Family History  Problem Relation Age of Onset  . Lupus Sister   . Heart disease Sister   . Breast cancer Mother    She notes that her father is currently 54 years old. Patients' mother is currently 44 years old and she was diagnosed with breast cancer at age 102. The patient has 4 brothers and 1 sister. She has a maternal aunt with breast cancer and her maternal great grandmother with had ovarian cancer.    GYNECOLOGIC HISTORY:  No LMP recorded. Patient is postmenopausal. Menarche: 57 years old Age at first live birth: 57 years old [place] P1 LMP: 13-14 years ago s/p ablation Contraceptive: no HRT: no  Hysterectomy: no SO: no  SOCIAL HISTORY: She is currently unemployed.  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 She lives alone without pets. Her daughter is Jacqueline Good who lives in [place] and works in Therapist, art.  The patient has one grandchild. She goes to a [employer].       ADVANCED DIRECTIVES: Not in place.  At the 03/03/2018 visit the patient was given the appropriate documents to complete and notarized at her discretion   HEALTH MAINTENANCE: Social History   Tobacco Use  . Smoking status: Former Smoker    Packs/day: 1.00      Years: 20.00    Pack years: 20.00    Types: Cigarettes    Last attempt to quit: 05/15/2018    Years since quitting: 0.2  . Smokeless tobacco: Never Used  Substance Use Topics  . Alcohol use: Not Currently    Comment: occasionally  . Drug use: No     Colonoscopy: Never  PAP:   Bone density: Never   Allergies  Allergen Reactions  . Gadolinium Derivatives Itching and Cough    Pt began sneezing and coughing as soon as MRI contrast was injected. Severe nasal congestion. No rash or hives no SOB.   . Iodinated Diagnostic Agents Itching    Current Outpatient Medications  Medication Sig Dispense Refill  . albuterol (VENTOLIN HFA) 108 (90 Base) MCG/ACT inhaler INHALE 2 PUFFS INTO THE LUNGS EVERY 6 HOURS AS NEEDED FOR WHEEZING (Patient taking differently: Inhale 2 puffs into the lungs every 6 (six) hours as needed for wheezing or shortness of breath. ) 54 g 3  . ALPRAZolam (XANAX) 1 MG tablet Take 1 mg by mouth 3 (three) times daily. for anxiety  2  . busPIRone (BUSPAR) 30 MG tablet Take 30 mg by mouth 2 (two) times daily.  2  . cetirizine-pseudoephedrine (ZYRTEC-D) 5-120 MG tablet Take 1 tablet by mouth daily. 15 tablet 0  . cholestyramine (QUESTRAN) 4 g packet Take 1 packet (4 g total) by mouth 2 (two) times daily. (Patient taking differently: Take 4 g by mouth daily as needed. ) 60 each 12  . dexamethasone (DECADRON) 4 MG tablet Take 2 tablets (8 mg total) by mouth 2 (two) times daily. Start the day before Taxotere. Take once the day after, then 2 times a day x 2d. 30 tablet 1  . diazepam (VALIUM) 2 MG tablet Take 1 tablet (2 mg total) by mouth every 8 (eight) hours as needed for up to 5 days for anxiety or muscle spasms. 15 tablet 0  . diphenoxylate-atropine (LOMOTIL) 2.5-0.025 MG tablet Take 1 tablet by mouth 4 (four) times daily as needed for diarrhea or loose stools. 30 tablet 0  . DULoxetine (CYMBALTA) 30 MG capsule 3 po qd for 7 days, then 2 po qd for 7 days, then 1 po qd for 7 days  then stop. 42 capsule 0  . fluticasone (FLONASE) 50 MCG/ACT nasal spray Place 2 sprays into both nostrils daily. 1 g 0  . Fluticasone-Salmeterol (ADVAIR DISKUS) 250-50 MCG/DOSE AEPB INHALE 1 PUFF INTO THE LUNGS 2 TIMES DAILY. (Patient taking differently: Inhale 1 puff into the lungs 2 (two) times daily as needed (wheezing or shortness of breath). ) 60 each 2  . lidocaine-prilocaine (EMLA) cream Apply to affected area once (Patient taking differently: Apply 1 application topically daily as needed (chemo or port flush). Apply to affected area once) 30 g 3  . loperamide (IMODIUM) 2 MG capsule Take 2 mg by mouth as needed for diarrhea or loose stools.    Marland Kitchen loratadine (CLARITIN) 10 MG tablet Take 10 mg by mouth daily as needed for allergies.    . naproxen (NAPROSYN) 500 MG tablet Take 1 tablet (500 mg  total) by mouth 2 (two) times daily with a meal. (Patient taking differently: Take 500 mg by mouth daily as needed for moderate pain. ) 60 tablet 3  . omeprazole (PRILOSEC) 40 MG capsule Take 1 capsule (40 mg total) by mouth daily. 30 capsule 2  . ondansetron (ZOFRAN) 4 MG tablet Take 1 tablet (4 mg total) by mouth daily as needed for up to 5 days for nausea or vomiting. 5 tablet 0  . propranolol (INDERAL) 20 MG tablet Take 1 tablet (20 mg total) by mouth 3 (three) times daily as needed (palpitations). 90 tablet 0  . venlafaxine (EFFEXOR) 75 MG tablet Take 2 qd until 08/07/18 and then start 3 po qd. 90 tablet 1  . zolpidem (AMBIEN) 10 MG tablet Take 10 mg by mouth at bedtime.     Marland Kitchen HYDROcodone-acetaminophen (NORCO) 5-325 MG tablet Take 2 tablets by mouth every 6 (six) hours as needed for moderate pain. (Patient not taking: Reported on 08/06/2018) 6 tablet 0   No current facility-administered medications for this visit.     OBJECTIVE:  Vitals:   08/06/18 1140  BP: 134/69  Pulse: 61  Resp: 16  Temp: 97.9 F (36.6 C)  SpO2: 97%     Body mass index is 34.12 kg/m.   Wt Readings from Last 3  Encounters:  08/06/18 198 lb 12.8 oz (90.2 kg)  08/03/18 200 lb 8 oz (90.9 kg)  08/03/18 198 lb (89.8 kg)  ECOG FS:1 - Symptomatic but completely ambulatory GENERAL: Patient is a well appearing female in no acute distress HEENT:  Sclerae anicteric.  Oropharynx clear and moist. No ulcerations or evidence of oropharyngeal candidiasis. Neck is supple.  NODES:  No cervical, supraclavicular, or axillary lymphadenopathy palpated.  BREAST EXAM:  deferred LUNGS:  Clear to auscultation bilaterally.  No wheezes or rhonchi. HEART:  Regular rate and rhythm. No murmur appreciated. ABDOMEN:  Soft, nontender.  Positive, normoactive bowel sounds. No organomegaly palpated. MSK:  No focal spinal tenderness to palpation. Full range of motion bilaterally in the upper extremities. EXTREMITIES:  No peripheral edema.   SKIN:  Clear with no obvious rashes or skin changes. No nail dyscrasia. NEURO:  Nonfocal. Well oriented.  Appropriate affect.    LAB RESULTS:  CMP     Component Value Date/Time   NA 140 08/02/2018 0911   K 3.8 08/02/2018 0911   CL 108 08/02/2018 0911   CO2 23 08/02/2018 0911   GLUCOSE 100 (H) 08/02/2018 0911   BUN 18 08/02/2018 0911   CREATININE 0.89 08/02/2018 0911   CREATININE 0.80 03/03/2018 0830   CREATININE 0.83 09/24/2016 1151   CALCIUM 9.2 08/02/2018 0911   PROT 7.2 08/02/2018 0911   ALBUMIN 4.0 08/02/2018 0911   AST 23 08/02/2018 0911   AST 15 03/03/2018 0830   ALT 35 08/02/2018 0911   ALT 16 03/03/2018 0830   ALKPHOS 129 (H) 08/02/2018 0911   BILITOT 0.5 08/02/2018 0911   BILITOT 0.5 03/03/2018 0830   GFRNONAA >60 08/02/2018 0911   GFRNONAA >60 03/03/2018 0830   GFRNONAA 79 09/24/2016 1151   GFRAA >60 08/02/2018 0911   GFRAA >60 03/03/2018 0830   GFRAA >89 09/24/2016 1151    No results found for: TOTALPROTELP, ALBUMINELP, A1GS, A2GS, BETS, BETA2SER, GAMS, MSPIKE, SPEI  No results found for: KPAFRELGTCHN, LAMBDASER, KAPLAMBRATIO  Lab Results  Component Value  Date   WBC 6.0 08/06/2018   NEUTROABS 4.3 08/06/2018   HGB 10.4 (L) 08/06/2018   HCT 32.4 (L) 08/06/2018  MCV 95.0 08/06/2018   PLT 134 (L) 08/06/2018    _0 @  No results found for: LABCA2  No components found for: LMBEML544  No results for input(s): INR in the last 168 hours.  No results found for: LABCA2  No results found for: BEE100  No results found for: FHQ197  No results found for: JOI325  No results found for: CA2729  No components found for: HGQUANT  No results found for: CEA1 / No results found for: CEA1   No results found for: AFPTUMOR  No results found for: CHROMOGRNA  No results found for: PSA1  Appointment on 08/06/2018  Component Date Value Ref Range Status  . WBC 08/06/2018 6.0  4.0 - 10.5 K/uL Final  . RBC 08/06/2018 3.41* 3.87 - 5.11 MIL/uL Final  . Hemoglobin 08/06/2018 10.4* 12.0 - 15.0 g/dL Final  . HCT 08/06/2018 32.4* 36.0 - 46.0 % Final  . MCV 08/06/2018 95.0  80.0 - 100.0 fL Final  . MCH 08/06/2018 30.5  26.0 - 34.0 pg Final  . MCHC 08/06/2018 32.1  30.0 - 36.0 g/dL Final  . RDW 08/06/2018 18.6* 11.5 - 15.5 % Final  . Platelets 08/06/2018 134* 150 - 400 K/uL Final  . nRBC 08/06/2018 0.0  0.0 - 0.2 % Final  . Neutrophils Relative % 08/06/2018 72  % Final  . Neutro Abs 08/06/2018 4.3  1.7 - 7.7 K/uL Final  . Lymphocytes Relative 08/06/2018 18  % Final  . Lymphs Abs 08/06/2018 1.1  0.7 - 4.0 K/uL Final  . Monocytes Relative 08/06/2018 9  % Final  . Monocytes Absolute 08/06/2018 0.5  0.1 - 1.0 K/uL Final  . Eosinophils Relative 08/06/2018 1  % Final  . Eosinophils Absolute 08/06/2018 0.0  0.0 - 0.5 K/uL Final  . Basophils Relative 08/06/2018 0  % Final  . Basophils Absolute 08/06/2018 0.0  0.0 - 0.1 K/uL Final  . Immature Granulocytes 08/06/2018 0  % Final  . Abs Immature Granulocytes 08/06/2018 0.02  0.00 - 0.07 K/uL Final   Performed at [facility], [address] [place], [place] [address]      (this displays the last labs from the last 3 days)  No results found for: TOTALPROTELP, ALBUMINELP, A1GS, A2GS, BETS, BETA2SER, GAMS, MSPIKE, SPEI (this displays SPEP labs)  No results found for: KPAFRELGTCHN, LAMBDASER, KAPLAMBRATIO (kappa/lambda light chains)  No results found for: HGBA, HGBA2QUANT, HGBFQUANT, HGBSQUAN (Hemoglobinopathy evaluation)   No results found for: LDH  No results found for: IRON, TIBC, IRONPCTSAT (Iron and TIBC)  No results found for: FERRITIN  Urinalysis    Component Value Date/Time   COLORURINE CANCELED 09/24/2016 1151   APPEARANCEUR CANCELED 09/24/2016 1151   LABSPEC CANCELED 09/24/2016 1151   PHURINE CANCELED 09/24/2016 1151   GLUCOSEU CANCELED 09/24/2016 1151   HGBUR CANCELED 09/24/2016 1151   BILIRUBINUR CANCELED 09/24/2016 1151   KETONESUR CANCELED 09/24/2016 1151   PROTEINUR CANCELED 09/24/2016 1151   NITRITE CANCELED 09/24/2016 1151   LEUKOCYTESUR CANCELED 09/24/2016 1151     STUDIES:  Mr Breast Bilateral W Wo Contrast Inc Cad  Result Date: 07/12/2018 CLINICAL DATA:  The patient had an ultrasound-guided core biopsy of mass in the 10 o'clock location of the RIGHT breast showing invasive ductal carcinoma and high-grade ductal carcinoma in situ. Ultrasound-guided core biopsy of an enlarged RIGHT axillary lymph node demonstrated metastatic breast carcinoma. Subsequent MR guided core biopsy of additional non mass enhancement demonstrated high-grade ductal carcinoma in situ anterior and posterior to the  known malignancy. The patient has now undergone neoadjuvant treatment. LABS:  None obtained at the time of imaging. EXAM: BILATERAL BREAST MRI WITH AND WITHOUT CONTRAST TECHNIQUE: Multiplanar, multisequence MR images of both breasts were obtained prior to and following the intravenous administration of 10 ml of Gadavist Three-dimensional MR images were rendered by post-processing of the original MR data on an independent workstation. The  three-dimensional MR images were interpreted, and findings are reported in the following complete MRI report for this study. Three dimensional images were evaluated at the independent DynaCad workstation COMPARISON:  03/31/2018 and earlier FINDINGS: Breast composition: c. Heterogeneous fibroglandular tissue. Background parenchymal enhancement: Moderate. Right breast: Within the UPPER-OUTER QUADRANT of the RIGHT breast there is a 4 millimeter nodule with persistent type enhancement kinetics. There has been resolution of the remainder of enhance within the LATERAL portion of the RIGHT breast. Left breast: No mass or abnormal enhancement. LEFT-sided Port-A-Cath present. Lymph nodes: No abnormal RIGHT axillary lymph nodes are identified, significantly improved from the previous study. LEFT axilla remains negative. Ancillary findings:  None. IMPRESSION: 1. Significant improvement in enhancement in the LATERAL portion of the RIGHT breast. A 4 millimeter enhancing nodule persists in the UPPER-OUTER QUADRANT of the RIGHT breast. 2. LEFT breast remains negative. 3. Interval resolution of RIGHT axillary lymphadenopathy. RECOMMENDATION: Treatment plan for known RIGHT breast malignancy. BI-RADS CATEGORY  6: Known biopsy-proven malignancy. Electronically Signed   By: Nolon Nations M.D.   On: 07/12/2018 12:23    ELIGIBLE FOR AVAILABLE RESEARCH PROTOCOL:BCEP  ASSESSMENT: 57 y.o. [place] woman status post right breast upper outer quadrant biopsy 02/25/2018 for a clinical T2 pN1, stage IIB invasive ductal carcinoma, estrogen and progesterone receptor negative, HER-2 amplified, with an MIB-1 of 50%.  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   (1) genetics testing pending  (2) neoadjuvant chemotherapy will consist of carboplatin, docetaxel, trastuzumab and Pertuzumab every 21 days x 6 starting 03/23/2018  (a) pertuzumab omitted after cycle 1 due to poorly-controlled  diarrhea  (b) docetaxel discontinued after cycle 3 due to neuropathy; gemcitabine substituted  (c) Granix substituted for Neulasta on days 3-6 following chemotherapy due to improved tolerance  (d) Switched back to Neulasta after cycles 5 and 6 per patient request  (3) anti-HER-2 treatment will continue for a minimum of 6 months  (a) baseline echocardiogram 03/16/2018 shows an ejection fraction in the 55-60% range.  (b) echo on 11/15 shows EF of 55-60%  (4) definitive surgery with targeted axillary dissection to follow  (5) adjuvant radiation  (6) left adrenal adenoma measuring 2.9 cm on CT of the chest 03/16/2018  PLAN:  Sangita is doing well today.  I reviewed her labs with her in detail.  They are much improved.  Her anemia is better since receiving the blood, and the platelets are now at a much higher level. She was happy with these results.  She will proceed with Trastuzumab alone today.  We reviewed the need to continue anti HER-2 therapy for a total of at least 6 months.  I told her that based on her surgery results, we will be able to give her a more definitive timeline.    I apologized to [place] regarding the confusion over her surgery.  I talked with Varney Biles, our breast navigator who reached out to both Dr. Lucia Gaskins and Dr. Marla Roe to provide [facility] with some clarification.    I reviewed her CT chest and MRI results with her again today at her request.  Ahliya will return on 1/22 for labs, f/u with Dr. Jana Hakim, and her next Trastuzumab.  She knows to call for any questions or concerns prior to her next appointment with [place].    A total of (20) minutes of face-to-face time was spent with this patient with greater than 50% of that time in counseling and care-coordination.   Wilber Bihari, NP  08/06/18 11:52 AM Medical Oncology and Hematology [facility] [address] [place], [place] [address] Tel. [phone]    Fax. [phone]

## 2018-08-06 NOTE — Patient Instructions (Signed)
[facility] Discharge Instructions for Patients Receiving Chemotherapy Today you received the following chemotherapy agents:  Herceptin To help prevent nausea and vomiting after your treatment, we encourage you to take your nausea medication as prescribed.   If you develop nausea and vomiting that is not controlled by your nausea medication, call the clinic.   BELOW ARE SYMPTOMS THAT SHOULD BE REPORTED IMMEDIATELY:  *FEVER GREATER THAN 100.5 F  *CHILLS WITH OR WITHOUT FEVER  NAUSEA AND VOMITING THAT IS NOT CONTROLLED WITH YOUR NAUSEA MEDICATION  *UNUSUAL SHORTNESS OF BREATH  *UNUSUAL BRUISING OR BLEEDING  TENDERNESS IN MOUTH AND THROAT WITH OR WITHOUT PRESENCE OF ULCERS  *URINARY PROBLEMS  *BOWEL PROBLEMS  UNUSUAL RASH Items with * indicate a potential emergency and should be followed up as soon as possible.  Feel free to call the clinic should you have any questions or concerns. The clinic phone number is [phone].  Please show the CHEMO ALERT CARD at check-in to the Emergency Department and triage nurse.   

## 2018-08-09 NOTE — Progress Notes (Signed)
Anesthesia Chart Review   Case:  [id number] Date/Time:  08/12/18 0845   Procedures:      RIGHT MODIFIED RADICAL MASTECTOMY (Right Breast)     RIGHT BREAST RECONSTRUCTION WITH PLACEMENT OF TISSUE EXPANDER AND FLEX HD (ACELLULAR HYDRATED DERMIS) (Right )   Anesthesia type:  General   Pre-op diagnosis:  RIGHT BREAST CANCER   Location:  [facility] OR ROOM 09 / August OR   Surgeon:  Alphonsa Overall, MD; Wallace Going, DO      DISCUSSION: 57 yo former smoker (20 pack years, quit 05/15/18) with h/o anemia, anxiety, depression, asthma, and breast cancer (dx 02/2018) scheduled for above surgery on 08/12/18 with Dr. Alphonsa Overall and Dr. Audelia Hives.  She has started chemo, Port-A-Cath in place.   Asked to review due to hemoglobin of 6.9 during PAT visit.  Results called to Dr. Pollie Friar office as well as her PCP Dr. Karle Plumber.  Last chemo treatment was 12/4 carbo/gemzar/herceptin. When speaking to PCP nurse pt reported mild SOB, denied dizziness, fatigue. She was scheduled to see Oncologist on 08/02/18 and advised to go to the ED if she became symptomatic.  Seen by Sandi Mealy, PA-C on 08/02/18.  CBC repeated with hemoglobin 7.5 and pt reported progressive shortness of breath and fatigue.  1 unit of packed red blood given on 08/02/18 and a second given on 08/03/18.    Labs rechecked on 08/06/18 with improvement of hemoglobin, 10.4.  She was also seen by Wilber Bihari, NP with oncology on 08/06/18, asymptomatic.  Received Trastuzumab alone on 08/06/18.   Will recheck hemoglobin day of surgery due to previous transfusion and subsequent rapid drop in case pt may need transfusion during case or post operatively.   Pt can proceed with planned procedure barring acute status change.   VS: BP (!) 113/55   Pulse 77   Temp 36.5 C   Resp 18   Ht 5' 4.5" (1.638 m)   SpO2 99%   BMI 34.49 kg/m   PROVIDERS: [name], MD  [name], Sarajane Jews, MD is Oncologist  Eppie Gibson, MD is Radiation  Oncologist  Alphonsa Overall, MD is General Surgeon  LABS: Labs reviewed: Repeat  (all labs ordered are listed, but only abnormal results are displayed)  Labs Reviewed  BASIC METABOLIC PANEL - Abnormal; Notable for the following components:      Result Value   BUN 21 (*)    All other components within normal limits  CBC - Abnormal; Notable for the following components:   RBC 2.24 (*)    Hemoglobin 6.9 (*)    HCT 22.0 (*)    Platelets 49 (*)    nRBC 0.4 (*)    All other components within normal limits   08/06/18 Repeat CBC: Hemoglobin 10.4  IMAGES:  CT Chest 03/16/18  IMPRESSION: 1. No evidence of thoracic metastatic disease. 2. Indeterminate left adrenal nodule. Consider further characterization with noncontrast CT through the adrenal glands. 3. Subjective mild hepatic steatosis. 4. Known right breast mass, biopsy proven malignancy.  EKG:  CV:  Echo 06/25/18 Study Conclusions  - Left ventricle: The cavity size was normal. Systolic function was   normal. The estimated ejection fraction was in the range of 55%   to 60%. Wall motion was normal; there were no regional wall   motion abnormalities. Left ventricular diastolic function   parameters were normal. - Aortic valve: Transvalvular velocity was within the normal range.   There was no stenosis. There was no regurgitation. - Mitral  valve: Transvalvular velocity was within the normal range.   There was no evidence for stenosis. There was trivial   regurgitation. - Right ventricle: The cavity size was normal. Wall thickness was   normal. Systolic function was normal. - Atrial septum: No defect or patent foramen ovale was identified   by color flow Doppler. - Tricuspid valve: There was no regurgitation. - Global longitudinal strain -22.6% (normal).   Past Medical History:  Diagnosis Date  . Anemia   . Anxiety   . Arthritis   . Asthma   . breast ca dx'd 02/2018   right  . Depression     Past Surgical History:   Procedure Laterality Date  . DILATION AND CURETTAGE OF UTERUS    . endometrial ablation    . IR IMAGING GUIDED PORT INSERTION  03/18/2018  . MYOMECTOMY      MEDICATIONS: . albuterol (VENTOLIN HFA) 108 (90 Base) MCG/ACT inhaler  . ALPRAZolam (XANAX) 1 MG tablet  . busPIRone (BUSPAR) 30 MG tablet  . cetirizine-pseudoephedrine (ZYRTEC-D) 5-120 MG tablet  . cholestyramine (QUESTRAN) 4 g packet  . dexamethasone (DECADRON) 4 MG tablet  . diphenoxylate-atropine (LOMOTIL) 2.5-0.025 MG tablet  . DULoxetine (CYMBALTA) 30 MG capsule  . fluticasone (FLONASE) 50 MCG/ACT nasal spray  . Fluticasone-Salmeterol (ADVAIR DISKUS) 250-50 MCG/DOSE AEPB  . HYDROcodone-acetaminophen (NORCO) 5-325 MG tablet  . lidocaine-prilocaine (EMLA) cream  . loperamide (IMODIUM) 2 MG capsule  . loratadine (CLARITIN) 10 MG tablet  . naproxen (NAPROSYN) 500 MG tablet  . omeprazole (PRILOSEC) 40 MG capsule  . propranolol (INDERAL) 20 MG tablet  . venlafaxine (EFFEXOR) 75 MG tablet  . zolpidem (AMBIEN) 10 MG tablet   No current facility-administered medications for this encounter.      Maia Plan [facility] Pre-Surgical Testing [phone] 08/09/18 3:12 PM

## 2018-08-09 NOTE — Anesthesia Preprocedure Evaluation (Addendum)
Anesthesia Evaluation  Patient identified by MRN, date of birth, ID band Patient awake    Reviewed: Allergy & Precautions, NPO status , Patient's Chart, lab work & pertinent test results  Airway Mallampati: I  TM Distance: >3 FB Neck ROM: Full    Dental   Pulmonary COPD, former smoker,    Pulmonary exam normal        Cardiovascular Normal cardiovascular exam     Neuro/Psych Anxiety Depression    GI/Hepatic GERD  Medicated and Controlled,  Endo/Other    Renal/GU      Musculoskeletal   Abdominal   Peds  Hematology   Anesthesia Other Findings   Reproductive/Obstetrics                            Anesthesia Physical Anesthesia Plan  ASA: III  Anesthesia Plan: General   Post-op Pain Management:    Induction: Intravenous  PONV Risk Score and Plan: 3 and Ondansetron, Midazolam and Treatment may vary due to age or medical condition  Airway Management Planned: Oral ETT  Additional Equipment:   Intra-op Plan:   Post-operative Plan: Extubation in OR  Informed Consent: I have reviewed the patients History and Physical, chart, labs and discussed the procedure including the risks, benefits and alternatives for the proposed anesthesia with the patient or authorized representative who has indicated his/her understanding and acceptance.     Plan Discussed with: CRNA and Surgeon  Anesthesia Plan Comments: (See PAT note 07/30/18, Konrad Felix, PA-C)       Anesthesia Quick Evaluation

## 2018-08-11 NOTE — H&P (Signed)
Jacqueline Good  Location: [facility] Surgery Patient #: [id number] DOB: 10-18-1960 Single / Language: Jacqueline Good / Race: Black or African American Female  History of Present Illness   The patient is a 58 year old female who presents with a complaint of breast cancer.  The PCP is Jacqueline Good. She saw Dr. Angelica Chessman [facility])  The pateint is at the [facility] is Jacqueline Good and Jacqueline Good  She is accompanied with her mother, Jacqueline Good, and her brother, Jacqueline Good.  She saw Dr. Marla Good around 06/15/2018 for discussion of reconstruction. She had some trouble to chemotherapy, but actually appears to done reasonably well with it. She has had a great response to the chemotx by the post treatment breast MRI. I discussed with her proceeding with a right mastectomy (she had 3 seperate biopsies with cancer) and right axillary lymph node dissection. Her original MRI at least 6 abnormal lymph nodes, though these are all resolved on her most recent MRI. I discussed the hospitalization, postoperative care, and follow-up.  Her breast MRI on 07/09/2018 showed 1. Significant improvement in enhancement in the LATERAL portion of the RIGHT breast. A 4 millimeter enhancing nodule persists in the UPPER-OUTER QUADRANT of the RIGHT breast. 2. LEFT breast remains negative. 3. Interval resolution of RIGHT axillary lymphadenopathy.  Addendum Note Jacqueline Good. 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 Jacqueline Gaskins MD; 07/21/2018 8:33 AM)  Presented [facility]. Will need mastectomy (has 3 positive biopsies) She had 6 abnormal lymph nodes on her original MRI - so she'll need an axillary node dissection.  History of breast cancer: Mammograms: [facility] [place] - 02/15/2018 - 3.1 x 2.7 x 1.7 cm UOQ (10 o'clock) of the right breast, additional 6 mm mass in the UOQ that has not been biopsied Biopsy: Right breast biopsy and right axillary node biopsy - 02/25/2018 ([id number]) - IDC, grade 2, ER/PR neg, Her2Neu - POSITIVE, Ki67 - 50% Family history of breast or ovarian cancer: Her mother had breast cancer On hormone therapy: No  I discussed the options for breast cancer treatment with the patient. The patient is at the [facility], which includes medical oncology and radiation oncology. I discussed the surgical options of lumpectomy vs. mastectomy. If mastectomy, there is the possibility of reconstruction. I discussed the options of lymph node biopsy. The treatment plan depends on the pathologic staging of the tumor and the patient's personal wishes. The risks of surgery include, but are not limited to, bleeding, infection, the need for further surgery, and nerve injury.  Plan: 1) Right mastectomy with reconstruction (Dr. Marla Good) and right axillary node dissection  Past Medical History: 1. Right breast cancer  Right breast biopsy and right axillary node biopsy - 02/25/2018 ([id number]) - IDC, grade 2, ER/PR neg, Her2Neu - POSITIVE, Ki67 - 50%  She has completed neoadjuvant chemotx 2. Asthma 3. History of depression  Sees therapist - Jacqueline Good 4. Back pain 5. Smokes  She has quit as of summer of 2019. 6. Left IJ power port - 03/18/2018 - IR  Social History: She is not married. She has one son - 27 yp She lives by herself Her mother, Jacqueline Good, is with her. [name] had right breast cancer treated by Jacqueline Good on 06/21/2015. Though Jacqueline Good did not remember who did the surgery.  She is accompanied with her mother, Jacqueline Good, and her sister, Jacqueline Good. Her brother is Jacqueline Good and come on the 07/21/2018 appt.  She is currently  unemployed for the last 6 months.   Allergies [facility], RMA; 07/21/2018 10:30 AM) Yellow Dyes  No Known Allergies [07/21/2018]: (Marked as Inactive) Allergies Reconciled   Medication History [employer], RMA; 07/21/2018 10:31 AM) Albuterol (90MCG/ACT Aerosol Soln, Inhalation) Active. Xanax (0.5MG Tablet, Oral) Active. ZyrTEC-D Allergy & Congestion (5-120MG Tablet ER 12HR, Oral) Active. Cymbalta (20MG Capsule DR Part, Oral) Active. NexIUM (40MG Capsule DR, Oral) Active. Flonase (50MCG/ACT Suspension, Nasal) Active. Naproxen (500MG Tablet, Oral) Active. Omeprazole (40MG Capsule DR, Oral) Active. Ondansetron HCl (8MG Tablet, Oral) Active. Lidocaine-Prilocaine (2.5-2.5% Cream, External) Active. Dexamethasone (4MG Tablet, Oral) Active. busPIRone HCl (30MG Tablet, Oral) Active. Medications Reconciled  Vitals (Jacqueline Good RMA; 07/21/2018 10:32 AM) 07/21/2018 10:31 AM Weight: 202 lb Height: 64in Body Surface Area: 1.96 m Body Mass Index: 34.67 kg/m  Temp.: 59F(Temporal)  Pulse: 67 (Regular)  P.OX: 95% (Room air) BP: 142/88 (Sitting, Left Arm, Standard)   Physical Exam  General: WN AA F alert. She was in a better mode than when I last met her. Skin: Inspection and palpation of the skin unremarkable.  Eyes: Conjunctivae white, pupils equal. Face, ears, nose, mouth, and throat: Face - normal. Normal ears and nose. Lips and teeth normal.  Neck: Supple. No mass. Trachea midline. No thyroid mass.  Lymph Nodes: No supraclavicular or cervical adenopathy. I feel no axillary adenopathy.  Lungs: Normal respiratory effort. Clear to auscultation and symmetric breath sounds. Cardiovascular: Regular rate and rythm. Normal auscultation of the heart. No murmur or rub.  Breast: Right: She has some skin pigmentation lateral to the right nipple, but I feel no mass or nodule.  Left: No mass or nodule. Port in the upper inner  left breast.  Abdomen: Soft. No mass. Liver and spleen not palpable. No tenderness. No hernia. Normal bowel sounds.   Musculoskeletal/extremities: Normal gait. Good strength and ROM in upper and lower extremities.   Assessment & Plan  1.  BREAST CANCER, STAGE 2, RIGHT (C50.911)  Story: Right breast biopsy and right axillary node biopsy - 02/25/2018 ([id number]) - IDC, grade 2, ER/PR neg, Her2Neu - POSITIVE, Ki67 - 50%  On[facility] - [facility]  Plan:   1) Plan right modified radical mastectomy (removal of lymph nodes) - schedule for January 2020   2) Immediate reconstruction by Dr. Marla Good    3) Continue herceptin for one year   4) Post op radiation tx  2.  Anemia - improved  Addendum Note(Maigen Mozingo H. Jacqueline Gaskins MD; 08/02/2018 4:44 PM)  Her pre op Hgb - 07/30/2018 - was 6.9. She has gone to the cancer center and been transfused today.  I have spoken to Dr. Jana Hakim and Dr. Marla Good.  Hgb as of 08/06/2018 - 10.4  2.  SMOKES (F17.200)  Impression: Quit smoking as of summer of 2019 3.  History of depression/ANXIETY (F41.9)  Sees therapist - Jacqueline Good  4. Asthma 5. Back pain   Alphonsa Overall, MD, [facility] Surgery Pager: [phone] Office phone:  [phone]

## 2018-08-12 ENCOUNTER — Encounter (HOSPITAL_COMMUNITY)
Admission: RE | Disposition: A | Discharge status: Home / Self Care | Payer: Self-pay | Source: Home / Self Care | Attending: Plastic Surgery

## 2018-08-12 ENCOUNTER — Inpatient Hospital Stay (HOSPITAL_COMMUNITY): Payer: Medicaid Other | Admitting: Physician Assistant

## 2018-08-12 ENCOUNTER — Observation Stay (HOSPITAL_COMMUNITY)
Admission: RE | Admit: 2018-08-12 | Discharge: 2018-08-14 | Disposition: A | Discharge status: Home / Self Care | Payer: Medicaid Other | Attending: Plastic Surgery | Admitting: Plastic Surgery

## 2018-08-12 ENCOUNTER — Ambulatory Visit: Payer: Medicaid Other | Admitting: Physician Assistant

## 2018-08-12 ENCOUNTER — Other Ambulatory Visit: Payer: Self-pay

## 2018-08-12 ENCOUNTER — Encounter (HOSPITAL_COMMUNITY): Payer: Self-pay

## 2018-08-12 DIAGNOSIS — D649 Anemia, unspecified: Secondary | ICD-10-CM | POA: Insufficient documentation

## 2018-08-12 DIAGNOSIS — G620 Drug-induced polyneuropathy: Secondary | ICD-10-CM | POA: Diagnosis not present

## 2018-08-12 DIAGNOSIS — Z171 Estrogen receptor negative status [ER-]: Secondary | ICD-10-CM | POA: Insufficient documentation

## 2018-08-12 DIAGNOSIS — Z791 Long term (current) use of non-steroidal anti-inflammatories (NSAID): Secondary | ICD-10-CM | POA: Diagnosis not present

## 2018-08-12 DIAGNOSIS — F331 Major depressive disorder, recurrent, moderate: Secondary | ICD-10-CM | POA: Diagnosis not present

## 2018-08-12 DIAGNOSIS — C50911 Malignant neoplasm of unspecified site of right female breast: Secondary | ICD-10-CM

## 2018-08-12 DIAGNOSIS — C50411 Malignant neoplasm of upper-outer quadrant of right female breast: Principal | ICD-10-CM | POA: Insufficient documentation

## 2018-08-12 DIAGNOSIS — Z87891 Personal history of nicotine dependence: Secondary | ICD-10-CM | POA: Diagnosis not present

## 2018-08-12 DIAGNOSIS — J449 Chronic obstructive pulmonary disease, unspecified: Secondary | ICD-10-CM | POA: Insufficient documentation

## 2018-08-12 DIAGNOSIS — C773 Secondary and unspecified malignant neoplasm of axilla and upper limb lymph nodes: Secondary | ICD-10-CM | POA: Diagnosis not present

## 2018-08-12 DIAGNOSIS — Z803 Family history of malignant neoplasm of breast: Secondary | ICD-10-CM | POA: Diagnosis not present

## 2018-08-12 DIAGNOSIS — F419 Anxiety disorder, unspecified: Secondary | ICD-10-CM | POA: Diagnosis not present

## 2018-08-12 DIAGNOSIS — C50919 Malignant neoplasm of unspecified site of unspecified female breast: Secondary | ICD-10-CM | POA: Diagnosis present

## 2018-08-12 DIAGNOSIS — Z79899 Other long term (current) drug therapy: Secondary | ICD-10-CM | POA: Diagnosis not present

## 2018-08-12 DIAGNOSIS — K219 Gastro-esophageal reflux disease without esophagitis: Secondary | ICD-10-CM | POA: Diagnosis not present

## 2018-08-12 HISTORY — PX: MASTECTOMY MODIFIED RADICAL: SHX5962

## 2018-08-12 HISTORY — DX: Gastro-esophageal reflux disease without esophagitis: K21.9

## 2018-08-12 HISTORY — PX: AXILLARY LYMPH NODE DISSECTION: SHX5229

## 2018-08-12 HISTORY — DX: Unspecified chronic bronchitis: J42

## 2018-08-12 HISTORY — PX: BREAST RECONSTRUCTION WITH PLACEMENT OF TISSUE EXPANDER AND FLEX HD (ACELLULAR HYDRATED DERMIS): SHX6295

## 2018-08-12 HISTORY — DX: Personal history of other medical treatment: Z92.89

## 2018-08-12 HISTORY — PX: MASTECTOMY MODIFIED RADICAL: SUR848

## 2018-08-12 HISTORY — DX: Malignant neoplasm of unspecified site of right female breast: C50.911

## 2018-08-12 LAB — CBC
HCT: 34.9 % — ABNORMAL LOW (ref 36.0–46.0)
HCT: 33.3 % — ABNORMAL LOW (ref 36.0–46.0)
Hemoglobin: 10.9 g/dL — ABNORMAL LOW (ref 12.0–15.0)
Hemoglobin: 10.4 g/dL — ABNORMAL LOW (ref 12.0–15.0)
MCH: 29.9 pg (ref 26.0–34.0)
MCH: 29.7 pg (ref 26.0–34.0)
MCHC: 31.2 g/dL (ref 30.0–36.0)
MCHC: 31.2 g/dL (ref 30.0–36.0)
MCV: 95.9 fL (ref 80.0–100.0)
MCV: 95.1 fL (ref 80.0–100.0)
Platelets: 191 10*3/uL (ref 150–400)
Platelets: 168 10*3/uL (ref 150–400)
RBC: 3.64 MIL/uL — ABNORMAL LOW (ref 3.87–5.11)
RBC: 3.5 MIL/uL — ABNORMAL LOW (ref 3.87–5.11)
RDW: 17.3 % — AB (ref 11.5–15.5)
RDW: 17.2 % — ABNORMAL HIGH (ref 11.5–15.5)
WBC: 5.7 10*3/uL (ref 4.0–10.5)
WBC: 9.7 10*3/uL (ref 4.0–10.5)
nRBC: 0 % (ref 0.0–0.2)
nRBC: 0 % (ref 0.0–0.2)

## 2018-08-12 LAB — TYPE AND SCREEN
ABO/RH(D): O POS
Antibody Screen: NEGATIVE

## 2018-08-12 LAB — ABO/RH: ABO/RH(D): O POS

## 2018-08-12 SURGERY — MASTECTOMY, MODIFIED RADICAL
Anesthesia: General | Site: Breast | Laterality: Right

## 2018-08-12 MED ORDER — ACETAMINOPHEN 500 MG PO TABS
ORAL_TABLET | ORAL | Status: AC
  Administered 2018-08-12: 1000 mg via ORAL
  Filled 2018-08-12: qty 2

## 2018-08-12 MED ORDER — GABAPENTIN 300 MG PO CAPS
ORAL_CAPSULE | ORAL | Status: AC
  Administered 2018-08-12: 300 mg via ORAL
  Filled 2018-08-12: qty 1

## 2018-08-12 MED ORDER — DIAZEPAM 2 MG PO TABS
2.0000 mg | ORAL_TABLET | Freq: Two times a day (BID) | ORAL | Status: DC | PRN
  Administered 2018-08-12 – 2018-08-13 (×2): 2 mg via ORAL
  Filled 2018-08-12 (×3): qty 1

## 2018-08-12 MED ORDER — PROPOFOL 10 MG/ML IV BOLUS
INTRAVENOUS | Status: DC | PRN
  Administered 2018-08-12: 120 mg via INTRAVENOUS

## 2018-08-12 MED ORDER — DEXAMETHASONE SODIUM PHOSPHATE 10 MG/ML IJ SOLN
INTRAMUSCULAR | Status: AC
  Filled 2018-08-12: qty 1

## 2018-08-12 MED ORDER — SENNA 8.6 MG PO TABS
1.0000 | ORAL_TABLET | Freq: Two times a day (BID) | ORAL | Status: DC
  Administered 2018-08-12 – 2018-08-13 (×2): 8.6 mg via ORAL
  Filled 2018-08-12 (×4): qty 1

## 2018-08-12 MED ORDER — MIDAZOLAM HCL 2 MG/2ML IJ SOLN
2.0000 mg | Freq: Once | INTRAMUSCULAR | Status: AC
  Administered 2018-08-12: 2 mg via INTRAVENOUS

## 2018-08-12 MED ORDER — DIPHENHYDRAMINE HCL 50 MG/ML IJ SOLN: 12.5000 mg | Freq: Four times a day (QID) | INTRAMUSCULAR | Status: DC | PRN

## 2018-08-12 MED ORDER — ONDANSETRON HCL 4 MG/2ML IJ SOLN: 4.0000 mg | Freq: Once | INTRAMUSCULAR | Status: DC | PRN

## 2018-08-12 MED ORDER — ROCURONIUM BROMIDE 50 MG/5ML IV SOSY
PREFILLED_SYRINGE | INTRAVENOUS | Status: AC
  Filled 2018-08-12: qty 5

## 2018-08-12 MED ORDER — GABAPENTIN 300 MG PO CAPS
300.0000 mg | ORAL_CAPSULE | ORAL | Status: AC
  Administered 2018-08-12: 300 mg via ORAL

## 2018-08-12 MED ORDER — ONDANSETRON 4 MG PO TBDP: 4.0000 mg | ORAL_TABLET | Freq: Four times a day (QID) | ORAL | Status: DC | PRN

## 2018-08-12 MED ORDER — MIDAZOLAM HCL 2 MG/2ML IJ SOLN
INTRAMUSCULAR | Status: AC
  Administered 2018-08-12: 2 mg via INTRAVENOUS
  Filled 2018-08-12: qty 2

## 2018-08-12 MED ORDER — SODIUM CHLORIDE 0.9 % IV SOLN
INTRAVENOUS | Status: DC | PRN
  Administered 2018-08-12: 500 mL

## 2018-08-12 MED ORDER — PROPOFOL 10 MG/ML IV BOLUS
INTRAVENOUS | Status: AC
  Filled 2018-08-12: qty 20

## 2018-08-12 MED ORDER — LACTATED RINGERS IV SOLN
INTRAVENOUS | Status: DC
  Administered 2018-08-12 (×2): via INTRAVENOUS

## 2018-08-12 MED ORDER — FENTANYL CITRATE (PF) 250 MCG/5ML IJ SOLN
INTRAMUSCULAR | Status: AC
  Filled 2018-08-12: qty 5

## 2018-08-12 MED ORDER — ROCURONIUM BROMIDE 10 MG/ML (PF) SYRINGE
PREFILLED_SYRINGE | INTRAVENOUS | Status: DC | PRN
  Administered 2018-08-12: 20 mg via INTRAVENOUS
  Administered 2018-08-12: 50 mg via INTRAVENOUS

## 2018-08-12 MED ORDER — PHENYLEPHRINE 40 MCG/ML (10ML) SYRINGE FOR IV PUSH (FOR BLOOD PRESSURE SUPPORT)
PREFILLED_SYRINGE | INTRAVENOUS | Status: AC
  Filled 2018-08-12: qty 10

## 2018-08-12 MED ORDER — DIPHENHYDRAMINE HCL 12.5 MG/5ML PO ELIX: 12.5000 mg | ORAL_SOLUTION | Freq: Four times a day (QID) | ORAL | Status: DC | PRN

## 2018-08-12 MED ORDER — LIDOCAINE 2% (20 MG/ML) 5 ML SYRINGE
INTRAMUSCULAR | Status: AC
  Filled 2018-08-12: qty 10

## 2018-08-12 MED ORDER — CHLORHEXIDINE GLUCONATE CLOTH 2 % EX PADS: 6.0000 | MEDICATED_PAD | Freq: Once | CUTANEOUS | Status: DC

## 2018-08-12 MED ORDER — CEFAZOLIN SODIUM-DEXTROSE 2-4 GM/100ML-% IV SOLN
INTRAVENOUS | Status: AC
  Filled 2018-08-12: qty 100

## 2018-08-12 MED ORDER — LIDOCAINE 2% (20 MG/ML) 5 ML SYRINGE
INTRAMUSCULAR | Status: DC | PRN
  Administered 2018-08-12: 100 mg via INTRAVENOUS

## 2018-08-12 MED ORDER — EPHEDRINE SULFATE 50 MG/ML IJ SOLN
INTRAMUSCULAR | Status: DC | PRN
  Administered 2018-08-12: 5 mg via INTRAVENOUS

## 2018-08-12 MED ORDER — CEFAZOLIN SODIUM-DEXTROSE 2-4 GM/100ML-% IV SOLN
2.0000 g | Freq: Three times a day (TID) | INTRAVENOUS | Status: DC
  Administered 2018-08-12 – 2018-08-14 (×6): 2 g via INTRAVENOUS
  Filled 2018-08-12 (×7): qty 100

## 2018-08-12 MED ORDER — ONDANSETRON HCL 4 MG/2ML IJ SOLN: 4.0000 mg | Freq: Four times a day (QID) | INTRAMUSCULAR | Status: DC | PRN

## 2018-08-12 MED ORDER — NAPROXEN 250 MG PO TABS
500.0000 mg | ORAL_TABLET | Freq: Two times a day (BID) | ORAL | Status: DC | PRN
  Administered 2018-08-12: 500 mg via ORAL
  Filled 2018-08-12: qty 2

## 2018-08-12 MED ORDER — SUGAMMADEX SODIUM 200 MG/2ML IV SOLN
INTRAVENOUS | Status: DC | PRN
  Administered 2018-08-12: 200 mg via INTRAVENOUS

## 2018-08-12 MED ORDER — HYDROMORPHONE HCL 1 MG/ML IJ SOLN
1.0000 mg | INTRAMUSCULAR | Status: DC | PRN
  Administered 2018-08-12 – 2018-08-13 (×5): 1 mg via INTRAVENOUS
  Filled 2018-08-12 (×6): qty 1

## 2018-08-12 MED ORDER — FENTANYL CITRATE (PF) 100 MCG/2ML IJ SOLN
100.0000 ug | Freq: Once | INTRAMUSCULAR | Status: AC
  Administered 2018-08-12: 100 ug via INTRAVENOUS

## 2018-08-12 MED ORDER — ONDANSETRON HCL 4 MG/2ML IJ SOLN
INTRAMUSCULAR | Status: AC
  Filled 2018-08-12: qty 2

## 2018-08-12 MED ORDER — SODIUM CHLORIDE 0.9 % IV SOLN
INTRAVENOUS | Status: AC
  Filled 2018-08-12: qty 500000

## 2018-08-12 MED ORDER — KCL IN DEXTROSE-NACL 20-5-0.45 MEQ/L-%-% IV SOLN
INTRAVENOUS | Status: DC
  Administered 2018-08-12 – 2018-08-14 (×3): via INTRAVENOUS
  Filled 2018-08-12 (×4): qty 1000

## 2018-08-12 MED ORDER — HYDROMORPHONE HCL 1 MG/ML IJ SOLN
INTRAMUSCULAR | Status: AC
  Filled 2018-08-12: qty 2

## 2018-08-12 MED ORDER — MIDAZOLAM HCL 2 MG/2ML IJ SOLN
INTRAMUSCULAR | Status: AC
  Filled 2018-08-12: qty 2

## 2018-08-12 MED ORDER — 0.9 % SODIUM CHLORIDE (POUR BTL) OPTIME
TOPICAL | Status: DC | PRN
  Administered 2018-08-12: 1000 mL

## 2018-08-12 MED ORDER — ONDANSETRON HCL 4 MG/2ML IJ SOLN
INTRAMUSCULAR | Status: DC | PRN
  Administered 2018-08-12: 4 mg via INTRAVENOUS

## 2018-08-12 MED ORDER — MEPERIDINE HCL 50 MG/ML IJ SOLN: 6.2500 mg | INTRAMUSCULAR | Status: DC | PRN

## 2018-08-12 MED ORDER — KETOROLAC TROMETHAMINE 10 MG PO TABS
10.0000 mg | ORAL_TABLET | Freq: Three times a day (TID) | ORAL | Status: DC
  Administered 2018-08-12 – 2018-08-14 (×6): 10 mg via ORAL
  Filled 2018-08-12 (×7): qty 1

## 2018-08-12 MED ORDER — ACETAMINOPHEN 500 MG PO TABS
1000.0000 mg | ORAL_TABLET | ORAL | Status: AC
  Administered 2018-08-12: 1000 mg via ORAL

## 2018-08-12 MED ORDER — PHENYLEPHRINE 40 MCG/ML (10ML) SYRINGE FOR IV PUSH (FOR BLOOD PRESSURE SUPPORT)
PREFILLED_SYRINGE | INTRAVENOUS | Status: DC | PRN
  Administered 2018-08-12: 40 ug via INTRAVENOUS

## 2018-08-12 MED ORDER — CEFAZOLIN SODIUM-DEXTROSE 2-4 GM/100ML-% IV SOLN: 2.0000 g | INTRAVENOUS | Status: DC

## 2018-08-12 MED ORDER — DEXAMETHASONE SODIUM PHOSPHATE 10 MG/ML IJ SOLN
INTRAMUSCULAR | Status: DC | PRN
  Administered 2018-08-12: 10 mg via INTRAVENOUS

## 2018-08-12 MED ORDER — CEFAZOLIN SODIUM-DEXTROSE 2-4 GM/100ML-% IV SOLN
2.0000 g | INTRAVENOUS | Status: AC
  Administered 2018-08-12: 2 g via INTRAVENOUS

## 2018-08-12 MED ORDER — HYDROMORPHONE HCL 1 MG/ML IJ SOLN
0.2500 mg | INTRAMUSCULAR | Status: DC | PRN
  Administered 2018-08-12 (×4): 0.5 mg via INTRAVENOUS

## 2018-08-12 MED ORDER — FENTANYL CITRATE (PF) 100 MCG/2ML IJ SOLN
INTRAMUSCULAR | Status: AC
  Administered 2018-08-12: 100 ug via INTRAVENOUS
  Filled 2018-08-12: qty 2

## 2018-08-12 MED ORDER — FENTANYL CITRATE (PF) 250 MCG/5ML IJ SOLN
INTRAMUSCULAR | Status: DC | PRN
  Administered 2018-08-12: 50 ug via INTRAVENOUS
  Administered 2018-08-12: 25 ug via INTRAVENOUS
  Administered 2018-08-12: 100 ug via INTRAVENOUS
  Administered 2018-08-12: 25 ug via INTRAVENOUS

## 2018-08-12 SURGICAL SUPPLY — 79 items
ADH SKN CLS APL DERMABOND .7 (GAUZE/BANDAGES/DRESSINGS) ×4
APPLIER CLIP 9.375 MED OPEN (MISCELLANEOUS) ×3
APR CLP MED 9.3 20 MLT OPN (MISCELLANEOUS) ×2
ATCH SMKEVC FLXB CAUT HNDSWH (FILTER) ×2 IMPLANT
BAG DECANTER FOR FLEXI CONT (MISCELLANEOUS) ×3 IMPLANT
BINDER BREAST LRG (GAUZE/BANDAGES/DRESSINGS) IMPLANT
BINDER BREAST XLRG (GAUZE/BANDAGES/DRESSINGS) ×1 IMPLANT
BIOPATCH BLUE 3/4IN DISK W/1.5 (GAUZE/BANDAGES/DRESSINGS) ×1 IMPLANT
BIOPATCH RED 1 DISK 7.0 (GAUZE/BANDAGES/DRESSINGS) ×11 IMPLANT
CANISTER SUCT 3000ML PPV (MISCELLANEOUS) ×6 IMPLANT
CHLORAPREP W/TINT 26ML (MISCELLANEOUS) ×3 IMPLANT
CLIP APPLIE 9.375 MED OPEN (MISCELLANEOUS) ×2 IMPLANT
COVER SURGICAL LIGHT HANDLE (MISCELLANEOUS) ×6 IMPLANT
COVER WAND RF STERILE (DRAPES) ×6 IMPLANT
DERMABOND ADVANCED (GAUZE/BANDAGES/DRESSINGS) ×2
DERMABOND ADVANCED .7 DNX12 (GAUZE/BANDAGES/DRESSINGS) ×2 IMPLANT
DRAIN CHANNEL 19F RND (DRAIN) ×9 IMPLANT
DRAPE CHEST BREAST 15X10 FENES (DRAPES) ×3 IMPLANT
DRAPE HALF SHEET 40X57 (DRAPES) ×6 IMPLANT
DRAPE ORTHO SPLIT 77X108 STRL (DRAPES) ×6
DRAPE SURG 17X23 STRL (DRAPES) ×12 IMPLANT
DRAPE SURG ORHT 6 SPLT 77X108 (DRAPES) ×4 IMPLANT
DRAPE WARM FLUID 44X44 (DRAPE) ×3 IMPLANT
DRSG PAD ABDOMINAL 8X10 ST (GAUZE/BANDAGES/DRESSINGS) ×5 IMPLANT
DRSG TEGADERM 4X4.75 (GAUZE/BANDAGES/DRESSINGS) ×6 IMPLANT
ELECT BLADE 4.0 EZ CLEAN MEGAD (MISCELLANEOUS)
ELECT CAUTERY BLADE 6.4 (BLADE) ×3 IMPLANT
ELECT REM PT RETURN 9FT ADLT (ELECTROSURGICAL) ×9
ELECTRODE BLDE 4.0 EZ CLN MEGD (MISCELLANEOUS) IMPLANT
ELECTRODE REM PT RTRN 9FT ADLT (ELECTROSURGICAL) ×6 IMPLANT
EVACUATOR SILICONE 100CC (DRAIN) ×9 IMPLANT
EVACUATOR SMOKE ACCUVAC VALLEY (FILTER) ×1
GAUZE SPONGE 4X4 12PLY STRL (GAUZE/BANDAGES/DRESSINGS) ×5 IMPLANT
GLOVE BIO SURGEON STRL SZ 6.5 (GLOVE) ×7 IMPLANT
GLOVE SURG SIGNA 7.5 PF LTX (GLOVE) ×3 IMPLANT
GOWN STRL REUS W/ TWL LRG LVL3 (GOWN DISPOSABLE) ×6 IMPLANT
GOWN STRL REUS W/ TWL XL LVL3 (GOWN DISPOSABLE) ×2 IMPLANT
GOWN STRL REUS W/TWL LRG LVL3 (GOWN DISPOSABLE) ×9
GOWN STRL REUS W/TWL XL LVL3 (GOWN DISPOSABLE) ×3
GRAFT FLEX HD 4X16 THICK (Tissue Mesh) ×1 IMPLANT
ILLUMINATOR WAVEGUIDE N/F (MISCELLANEOUS) IMPLANT
IMPL EXPANDER BREAST 535CC (Breast) IMPLANT
IMPLANT BREAST 535CC (Breast) ×1 IMPLANT
IMPLANT EXPANDER BREAST 535CC (Breast) ×2 IMPLANT
KIT BASIN OR (CUSTOM PROCEDURE TRAY) ×6 IMPLANT
KIT FILL SYSTEM UNIVERSAL (SET/KITS/TRAYS/PACK) ×1 IMPLANT
KIT TURNOVER KIT B (KITS) ×6 IMPLANT
LIGHT WAVEGUIDE WIDE FLAT (MISCELLANEOUS) IMPLANT
NDL 21 GA WING INFUSION (NEEDLE) IMPLANT
NDL HYPO 25GX1X1/2 BEV (NEEDLE) IMPLANT
NEEDLE 21 GA WING INFUSION (NEEDLE) ×3 IMPLANT
NEEDLE HYPO 25GX1X1/2 BEV (NEEDLE) ×3 IMPLANT
NS IRRIG 1000ML POUR BTL (IV SOLUTION) ×9 IMPLANT
PACK GENERAL/GYN (CUSTOM PROCEDURE TRAY) ×6 IMPLANT
PAD ABD 8X10 STRL (GAUZE/BANDAGES/DRESSINGS) ×6 IMPLANT
PAD ARMBOARD 7.5X6 YLW CONV (MISCELLANEOUS) ×6 IMPLANT
PENCIL SMOKE EVACUATOR (MISCELLANEOUS) ×3 IMPLANT
PIN SAFETY STERILE (MISCELLANEOUS) ×3 IMPLANT
PREFILTER EVAC NS 1 1/3-3/8IN (MISCELLANEOUS) ×3 IMPLANT
SET ASEPTIC TRANSFER (MISCELLANEOUS) IMPLANT
SLEEVE SUCTION 125 (MISCELLANEOUS) ×1 IMPLANT
SPECIMEN JAR X LARGE (MISCELLANEOUS) ×3 IMPLANT
SPONGE LAP 18X18 RF (DISPOSABLE) ×1 IMPLANT
SPONGE LAP 18X18 X RAY DECT (DISPOSABLE) IMPLANT
SUT ETHILON 2 0 FS 18 (SUTURE) ×4 IMPLANT
SUT MNCRL AB 4-0 PS2 18 (SUTURE) ×10 IMPLANT
SUT MON AB 3-0 SH 27 (SUTURE) ×9
SUT MON AB 3-0 SH27 (SUTURE) ×4 IMPLANT
SUT MON AB 5-0 PS2 18 (SUTURE) ×7 IMPLANT
SUT PDS AB 2-0 CT1 27 (SUTURE) ×14 IMPLANT
SUT SILK 3 0 (SUTURE)
SUT SILK 3-0 18XBRD TIE 12 (SUTURE) IMPLANT
SUT SILK 4 0 PS 2 (SUTURE) ×4 IMPLANT
SUT VIC AB 3-0 SH 18 (SUTURE) ×3 IMPLANT
TAPE CLOTH SURG 4X10 WHT LF (GAUZE/BANDAGES/DRESSINGS) ×1 IMPLANT
TOWEL OR 17X24 6PK STRL BLUE (TOWEL DISPOSABLE) ×6 IMPLANT
TOWEL OR 17X26 10 PK STRL BLUE (TOWEL DISPOSABLE) ×6 IMPLANT
TRAY FOLEY MTR SLVR 14FR STAT (SET/KITS/TRAYS/PACK) IMPLANT
TUBE CONNECTING 12X1/4 (SUCTIONS) ×3 IMPLANT

## 2018-08-12 NOTE — Anesthesia Postprocedure Evaluation (Signed)
Anesthesia Post Note  Patient: Jacqueline Good  Procedure(s) Performed: RIGHT MODIFIED RADICAL MASTECTOMY (Right Breast) AXILLARY LYMPH NODE DISSECTION (Right Axilla) RIGHT BREAST RECONSTRUCTION WITH PLACEMENT OF TISSUE EXPANDER AND FLEX HD (ACELLULAR HYDRATED DERMIS) (Right Breast)     Patient location during evaluation: PACU Anesthesia Type: General Level of consciousness: awake and alert Pain management: pain level controlled Vital Signs Assessment: post-procedure vital signs reviewed and stable Respiratory status: spontaneous breathing, nonlabored ventilation, respiratory function stable and patient connected to nasal cannula oxygen Cardiovascular status: blood pressure returned to baseline and stable Postop Assessment: no apparent nausea or vomiting Anesthetic complications: no    Last Vitals:  Vitals:   08/12/18 1310 08/12/18 1326  BP: 120/62 121/67  Pulse:  (!) 59  Resp:  18  Temp:  36.7 C  SpO2:  98%    Last Pain:  Vitals:   08/12/18 1500  TempSrc:   PainSc: 0-No pain                 [name] DAVID

## 2018-08-12 NOTE — Discharge Instructions (Signed)
INSTRUCTIONS FOR AFTER BREAST SURGERY ° ° °You are getting ready to undergo breast surgery.  You will likely have some questions about what to expect following your operation.  The following information will help you and your family understand what to expect when you are discharged from the hospital.  Following these guidelines will help ensure a smooth recovery and reduce risks of complications.   °Postoperative instructions include information on: diet, wound care, medications and physical activity. ° °AFTER SURGERY °Expect to go home after the procedure.  In some cases, you may need to spend one night in the hospital for observation. ° °DIET °Breast surgery does not require a specific diet.  However, I have to mention that the healthier you eat the better your body can start healing. It is important to increasing your protein intake.  This means limiting the foods with sugar and carbohydrates.  Focus on vegetables and some meat.  If you have any liposuction during your procedure be sure to drink water.  If your urine is bright yellow, then it is concentrated, and you need to drink more water.  As a general rule after surgery, you should have 8 ounces of water every hour while awake.  If you find you are persistently nauseated or unable to take in liquids let us know.  NO TOBACCO USE or EXPOSURE.  This will slow your healing process and increase the risk of a wound. ° °WOUND CARE °You can shower the day after surgery if you don't have a drain.  Use fragrance free soap.  Dial, Dove and Ivory are usually mild on the skin. If you have a drain clean with baby wipes until the drain is removed.  If you have steri-strips / tape directly attached to your skin leave them in place. It is OK to get these wet.  No baths, pools or hot tubs for two weeks. °We close your incision to leave the smallest and best-looking scar. No ointment or creams on your incisions until given the go ahead.  Especially not Neosporin (Too many skin  reactions with this one).  A few weeks after surgery you can use Mederma and start massaging the scar. °We ask you to wear your binder or sports bra for the first 6 weeks around the clock, including while sleeping. This provides added comfort and helps reduce the fluid accumulation at the surgery site. ° °ACTIVITY °No heavy lifting until cleared by the doctor.  This usually means no more than a half-gallon of milk.  It is OK to walk and climb stairs. In fact, moving your legs is very important to decrease your risk of a blood clot.  It will also help keep you from getting deconditioned.  Every 1 to 2 hours get up and walk for 5 minutes. This will help with a quicker recovery back to normal.  Let pain be your guide so you don't do too much.  NO, you cannot do the spring cleaning and don't plan on taking care of anyone else.  This is your time for TLC.  °You will be more comfortable if you sleep and rest with your head elevated either with a few pillows under you or in a recliner.  No stomach sleeping for a few months. ° °WORK °Everyone returns to work at different times. As a rough guide, most people take at least 1 - 2 weeks off prior to returning to work. If you need documentation for your job, bring the forms to your postoperative follow   up visit. ° °DRIVING °Arrange for someone to bring you home from the hospital.  You may be able to drive a few days after surgery but not while taking any narcotics or valium. ° °BOWEL MOVEMENTS °Constipation can occur after anesthesia and while taking pain medication.  It is important to stay ahead for your comfort.  We recommend taking Milk of Magnesia (2 tablespoons; twice a day) while taking the pain pills. ° °SEROMA °This is fluid your body tried to put in the surgical site.  This is normal but if it creates tight skinny skin let us know.  It usually decreases in a few weeks. ° °WHEN TO CALL °Call your surgeon's office if any of the following occur: °• Fever 101 degrees F or  greater °• Excessive bleeding or fluid from the incision site. °• Pain that increases over time without aid from the medications °• Redness, warmth, or pus draining from incision sites °• Persistent nausea or inability to take in liquids °• Severe misshapen area that underwent the operation. ° °Here are some resources: ° °1. Plastic surgery website: https://www.plasticsurgery.org/for-medical-professionals/education-and-resources/publications/breast-reconstruction-magazine °2. Breast Reconstruction Awareness Campaign:  http://www.breastreconusa.org/ °3. Plastic surgery Implant information:  https://www.plasticsurgery.org/patient-safety/breast-implant-safety ° °

## 2018-08-12 NOTE — Op Note (Addendum)
08/12/2018  11:15 AM  PATIENT:  Jacqueline Good, 58 y.o., female, MRN: [id number]  PREOP DIAGNOSIS:  RIGHT BREAST CANCER  POSTOP DIAGNOSIS:   Right breast cancer, 10 o'clock position (T2, N1)  PROCEDURE:   Procedure(s): RIGHT MODIFIED RADICAL MASTECTOMY,  RIGHT BREAST RECONSTRUCTION WITH PLACEMENT OF TISSUE EXPANDER AND FLEX HD (ACELLULAR HYDRATED DERMIS)  SURGEON:   Alphonsa Overall, M.D.  ASSISTANT:   None  ANESTHESIA:   general  Anesthesiologist: Lillia Abed, MD CRNA: Bryson Corona, CRNA; Leim Fabry T, CRNA  General  ASA:  3  EBL:  100  ml  BLOOD ADMINISTERED: none  DRAINS: per Dr. Marla Roe  LOCAL MEDICATIONS USED:   Right pectoral block by anesthesia  SPECIMEN:   Right breast and axillary contents (suture lateral), highest right axillay node  COUNTS CORRECT:  YES  INDICATIONS FOR PROCEDURE:  [name] is a 58 y.o. (DOB: 1960/11/22) AA female whose primary care physician is [name], MD and comes for right modified radical mastectomy with immediate reconstruction by Dr. Marla Roe.   She had a right breast cancer that is ER negative, Her2Neu positive with a positive axillary node diagnosed in July 2019.  She has undergone neoadjuvant chemotx supervised by Dr. Jana Hakim.  By MRI on 07/09/2018, her tumor has significant improvement. The indications and risks of the surgery were explained to the patient.  The risks include, but are not limited to, infection, bleeding, and nerve injury.  OPERATIVE NOTE;  The patient was taken to room # 9 at [facility] where she underwent a general anesthesia  supervised by Anesthesiologist: Lillia Abed, MD CRNA: Bryson Corona, CRNA; Elayne Snare, CRNA. Both her breasts and axilla were prepped with ChloraPrep and sterilely draped.    A time-out and the surgical check list was reviewed.    I made an elliptical incision including the areola in the right breast.  I developed skin flaps medially to the lateral edge of the  sternum, inferiorly to the investing fascia of the rectus abdominus muscle, laterally to the anterior edge of the latissimus dorsi muscle, and superiorly to about 2 finger breaths below the clavicle.  The breast was reflected off the pectoralis muscle from medial to lateral.   A long suture was placed on the lateral aspect of the breast.   I continued the dissection into the right axilla.  I identified the right axillary vein and swept all the axillary fat/nodes inferior to it.  I divided one venous tributary.  I spared the long thoracic nerve of Bell and the thoracodorsal nerve.  I swept the fat/nodes behind the head of the pectoralis minor muscle (Level II dissection).   I also found one node anterior to the axillary vein that I labeled as the "highest axilary node".   I irrigated the wound and controlled all bleeding vessels.  At this point, Dr. Marla Roe scrubbed in and started the reconstruction part of the operation.  She will dictate the remainder of the operation.   Alphonsa Overall, MD, [facility] Surgery Pager: [phone] Office phone:  [phone]

## 2018-08-12 NOTE — Interval H&P Note (Signed)
History and Physical Interval Note:  08/12/2018 8:52 AM  Jacqueline Good  has presented today for surgery, with the diagnosis of RIGHT BREAST CANCER  The various methods of treatment have been discussed with the patient and family.   Daughter, [name], in room with patient.  I answered questions about surgery and reconstruction.  After consideration of risks, benefits and other options for treatment, the patient has consented to  Procedure(s): RIGHT MODIFIED RADICAL MASTECTOMY (Right) RIGHT BREAST RECONSTRUCTION WITH PLACEMENT OF TISSUE EXPANDER AND FLEX HD (ACELLULAR HYDRATED DERMIS) (Right) as a surgical intervention .  The patient's history has been reviewed, patient examined, no change in status, stable for surgery.  I have reviewed the patient's chart and labs.  Questions were answered to the patient's satisfaction.     Shann Medal

## 2018-08-12 NOTE — Transfer of Care (Signed)
Immediate Anesthesia Transfer of Care Note  Patient: Jacqueline Good  Procedure(s) Performed: RIGHT MODIFIED RADICAL MASTECTOMY (Right Breast) AXILLARY LYMPH NODE DISSECTION (Right Axilla) RIGHT BREAST RECONSTRUCTION WITH PLACEMENT OF TISSUE EXPANDER AND FLEX HD (ACELLULAR HYDRATED DERMIS) (Right Breast)  Patient Location: PACU  Anesthesia Type:General  Level of Consciousness: awake and alert   Airway & Oxygen Therapy: Patient Spontanous Breathing and Patient connected to face mask oxygen  Post-op Assessment: Report given to RN and Post -op Vital signs reviewed and stable  Post vital signs: Reviewed and stable  Last Vitals:  Vitals Value Taken Time  BP 118/80 08/12/2018 11:54 AM  Temp 36.5 C 08/12/2018 11:55 AM  Pulse 66 08/12/2018 12:00 PM  Resp 11 08/12/2018 12:00 PM  SpO2 99 % 08/12/2018 12:00 PM  Vitals shown include unvalidated device data.  Last Pain:  Vitals:   08/12/18 1155  TempSrc:   PainSc: 0-No pain         Complications: No apparent anesthesia complications

## 2018-08-12 NOTE — Op Note (Signed)
Op report    DATE OF OPERATION:  08/12/2018  LOCATION: [facility] Operating Room  SURGICAL DIVISION: Plastic Surgery  PREOPERATIVE DIAGNOSES:  1. Right Breast cancer.    POSTOPERATIVE DIAGNOSES:  1. Right Breast cancer.   PROCEDURE:  1. Right immediate breast reconstruction with placement of Acellular Dermal Matrix and tissue expanders.  SURGEON: Nika Yazzie Sanger Vasilisa Vore, DO  ASSISTANT: Ronette Deter, PA  ANESTHESIA:  General.   COMPLICATIONS: None.   IMPLANTS: Right - Mentor 535 cc. Ref #[id number].  Serial Number [id number], 100 cc of injectable saline placed in the expander. Acellular Dermal Matrix 4 x 16 cm  INDICATIONS FOR PROCEDURE:  The patient, Jacqueline Good, is a 58 y.o. female born on 1961/04/16, is here for  immediate first stage breast reconstruction with placement of right tissue expander and Acellular dermal matrix. MRN: [id number]  CONSENT:  Informed consent was obtained directly from the patient. Risks, benefits and alternatives were fully discussed. Specific risks including but not limited to bleeding, infection, hematoma, seroma, scarring, pain, implant infection, implant extrusion, capsular contracture, asymmetry, wound healing problems, and need for further surgery were all discussed. The patient did have an ample opportunity to have her questions answered to her satisfaction.   DESCRIPTION OF PROCEDURE:  The patient was taken to the operating room by the general surgery team. SCDs were placed and IV antibiotics were given. The patient's chest was prepped and draped in a sterile fashion. A time out was performed and the implants to be used were identified.  A right mastectomy was performed.  Once the general surgery team had completed their portion of the case the patient was rendered to the plastic and reconstructive surgery team.  Right:  The pectoralis major muscle was lifted from the chest wall with release of the lateral edge and lateral inframammary  fold.  The pocket was irrigated with antibiotic solution and hemostasis was achieved with electrocautery.  The ADM was then prepared according to the manufacture guidelines and slits placed to help with postoperative fluid management.  The ADM was then sutured to the inferior and lateral edge of the inframammary fold with 2-0 PDS starting with an interrupted stitch and then a running stitch.  The lateral portion was sutured to with interrupted sutures after the expander was placed.  The expander was prepared according to the manufacture guidelines, the air evacuated and then it was placed under the ADM and pectoralis major muscle.  The inferior and lateral tabs were used to secure the expander to the chest wall with 2-0 PDS.  The drain was placed at the inframammary fold over the ADM and secured to the skin with 3-0 Silk.    The deep layers were closed with 3-0 Monocryl followed by 4-0 Monocryl.  The skin was closed with 5-0 Monocryl and then dermabond was applied.  The ABDs and breast binder were placed.  The patient tolerated the procedure well and there were no complications.  The patient was allowed to wake from anesthesia and taken to the recovery room in satisfactory condition.

## 2018-08-12 NOTE — Interval H&P Note (Signed)
History and Physical Interval Note:  08/12/2018 8:47 AM  Jacqueline Good  has presented today for surgery, with the diagnosis of RIGHT BREAST CANCER  The various methods of treatment have been discussed with the patient and family. After consideration of risks, benefits and other options for treatment, the patient has consented to  Procedure(s): RIGHT MODIFIED RADICAL MASTECTOMY (Right) RIGHT BREAST RECONSTRUCTION WITH PLACEMENT OF TISSUE EXPANDER AND FLEX HD (ACELLULAR HYDRATED DERMIS) (Right) as a surgical intervention .  The patient's history has been reviewed, patient examined, no change in status, stable for surgery.  I have reviewed the patient's chart and labs.  Questions were answered to the patient's satisfaction.     [name]

## 2018-08-12 NOTE — Progress Notes (Signed)
Pt new admit from PACU with right modified radical mastectomy and reconstruction with placement of tissue expander and flex HD, alert and oriented with right JP drain, wound site dry and intact , skin glued and breast binder, family at the bedside, no complain of pain at this time.

## 2018-08-12 NOTE — Anesthesia Procedure Notes (Signed)
Procedure Name: Intubation Date/Time: 08/12/2018 9:26 AM Performed by: Bryson Corona, CRNA Pre-anesthesia Checklist: Patient identified, Emergency Drugs available, Suction available and Patient being monitored Patient Re-evaluated:Patient Re-evaluated prior to induction Oxygen Delivery Method: Circle System Utilized Preoxygenation: Pre-oxygenation with 100% oxygen Induction Type: IV induction Ventilation: Oral airway inserted - appropriate to patient size Laryngoscope Size: Mac and 4 Grade View: Grade I Tube type: Oral Tube size: 7.0 mm Number of attempts: 1 Airway Equipment and Method: Stylet and Oral airway Placement Confirmation: ETT inserted through vocal cords under direct vision,  positive ETCO2 and breath sounds checked- equal and bilateral Secured at: 21 cm Tube secured with: Tape Dental Injury: Teeth and Oropharynx as per pre-operative assessment

## 2018-08-13 ENCOUNTER — Telehealth: Payer: Self-pay

## 2018-08-13 ENCOUNTER — Encounter (HOSPITAL_COMMUNITY): Payer: Self-pay | Admitting: Surgery

## 2018-08-13 DIAGNOSIS — C50411 Malignant neoplasm of upper-outer quadrant of right female breast: Secondary | ICD-10-CM | POA: Diagnosis not present

## 2018-08-13 LAB — CBC
HCT: 28.9 % — ABNORMAL LOW (ref 36.0–46.0)
Hemoglobin: 9.1 g/dL — ABNORMAL LOW (ref 12.0–15.0)
MCH: 30.1 pg (ref 26.0–34.0)
MCHC: 31.5 g/dL (ref 30.0–36.0)
MCV: 95.7 fL (ref 80.0–100.0)
Platelets: 177 10*3/uL (ref 150–400)
RBC: 3.02 MIL/uL — ABNORMAL LOW (ref 3.87–5.11)
RDW: 17.4 % — ABNORMAL HIGH (ref 11.5–15.5)
WBC: 10 10*3/uL (ref 4.0–10.5)
nRBC: 0 % (ref 0.0–0.2)

## 2018-08-13 LAB — HIV ANTIBODY (ROUTINE TESTING W REFLEX): HIV Screen 4th Generation wRfx: NONREACTIVE

## 2018-08-13 MED ORDER — WHITE PETROLATUM EX OINT
TOPICAL_OINTMENT | CUTANEOUS | Status: AC
  Administered 2018-08-13: 16:00:00
  Filled 2018-08-13: qty 28.35

## 2018-08-13 NOTE — Progress Notes (Signed)
  [facility] Office:  [phone] General Surgery Progress Note   LOS: 1 day  POD -  1 Day Post-Op  Chief Complaint: Right breast cancer  Assessment and Plan: 1.  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 [name] and [employer] look good.  On Ancef  Primary issue is pain control  2.  Anemia  Post op Hgb - 10.4 - 08/12/2017  3.  History of depression/ANXIETY (F41.9)             Sees therapist - Tonia Brooms 4. Asthma 5. Back pain   Active Problems:   Breast cancer (HCC)  Subjective:  Doing okay except for pain control.  Primary pain at incision/right chest.  Boyfriend in room.  Objective:   Vitals:   08/13/18 0227 08/13/18 0613  BP: 96/64 95/69  Pulse: 66 63  Resp: 18 18  Temp: 97.9 F (36.6 C) 98.3 F (36.8 C)  SpO2: 96% 97%     Intake/Output from previous day:  01/02 0701 - 01/03 0700 In: 1970 [P.O.:320; I.V.:1450; IV Piggyback:200] Out: 770 [Urine:530; Drains:190; Blood:50]  Intake/Output this shift:  No intake/output data recorded.   Physical Exam:   General: WN AA F who is alert and oriented.    HEENT: Normal. Pupils equal. .   Lungs: Clear   Chest - Wound looks good.  Drain - 190 cc recorded.     Lab Results:    Recent Labs    08/12/18 0758 08/12/18 1348  WBC 5.7 9.7  HGB 10.9* 10.4*  HCT 34.9* 33.3*  PLT 191 168    BMET  No results for input(s): NA, K, CL, CO2, GLUCOSE, BUN, CREATININE, CALCIUM in the last 72 hours.  PT/INR  No results for input(s): LABPROT, INR in the last 72 hours.  ABG  No results for input(s): PHART, HCO3 in the last 72 hours.  Invalid input(s): PCO2, PO2   Studies/Results:  No results found.   Anti-infectives:   Anti-infectives (From admission, onward)   Start      Dose/Rate Route Frequency Ordered Stop   08/12/18 1700  ceFAZolin (ANCEF) IVPB 2g/100 mL premix     2 g 200 mL/hr over 30 Minutes Intravenous Every 8 hours 08/12/18 1152     08/12/18 1000  polymyxin B 500,000 Units, bacitracin 50,000 Units in sodium chloride 0.9 % 500 mL irrigation  Status:  Discontinued       As needed 08/12/18 1000 08/12/18 1149   08/12/18 0730  ceFAZolin (ANCEF) IVPB 2g/100 mL premix  Status:  Discontinued     2 g 200 mL/hr over 30 Minutes Intravenous On call to O.R. 08/12/18 4193 08/12/18 0722   08/12/18 0730  ceFAZolin (ANCEF) IVPB 2g/100 mL premix     2 g 200 mL/hr over 30 Minutes Intravenous On call to O.R. 08/12/18 0720 08/12/18 7902      Alphonsa Overall, MD, FACS Pager: [facility] Office: [phone] 08/13/2018

## 2018-08-13 NOTE — Telephone Encounter (Signed)
08/12/18- telephone call to pt.- spoke to her daughter who reports that pt is out of surgery & in recovery- and procedure went well  I instructed pt to call if any concerns  [place]

## 2018-08-14 ENCOUNTER — Encounter (HOSPITAL_COMMUNITY): Payer: Self-pay | Admitting: Plastic Surgery

## 2018-08-14 DIAGNOSIS — C50411 Malignant neoplasm of upper-outer quadrant of right female breast: Secondary | ICD-10-CM | POA: Diagnosis not present

## 2018-08-14 NOTE — Progress Notes (Signed)
Patient ID: Jacqueline Good, female   DOB: 1961/07/30, 58 y.o.   MRN: [id number] 2 Days Post-Op   Subjective: Pain is better.  Still using small amount of IV narcotic.  No other complaints.  Objective: Vital signs in last 24 hours: Temp:  [98 F (36.7 C)-98.5 F (36.9 C)] 98.2 F (36.8 C) (01/04 0505) Pulse Rate:  [56-64] 57 (01/04 0505) Resp:  [16-19] 16 (01/04 0505) BP: (101-127)/(65-68) 111/67 (01/04 0505) SpO2:  [93 %-99 %] 93 % (01/04 0505) Last BM Date: 08/11/18  Intake/Output from previous day: 01/03 0701 - 01/04 0700 In: 3080 [P.O.:980; I.V.:1700; IV Piggyback:400] Out: 1360 [Urine:1200; Drains:160] Intake/Output this shift: No intake/output data recorded.  General appearance: alert, cooperative and no distress Incision/Wound: Wound clean, skin flaps look fine.  JP drainage serosanguineous  Lab Results:  Recent Labs    08/12/18 1348 08/13/18 0558  WBC 9.7 10.0  HGB 10.4* 9.1*  HCT 33.3* 28.9*  PLT 168 177   BMET No results for input(s): NA, K, CL, CO2, GLUCOSE, BUN, CREATININE, CALCIUM in the last 72 hours.   Studies/Results: No results found.  Anti-infectives: Anti-infectives (From admission, onward)   Start     Dose/Rate Route Frequency Ordered Stop   08/12/18 1700  ceFAZolin (ANCEF) IVPB 2g/100 mL premix     2 g 200 mL/hr over 30 Minutes Intravenous Every 8 hours 08/12/18 1152     08/12/18 1000  polymyxin B 500,000 Units, bacitracin 50,000 Units in sodium chloride 0.9 % 500 mL irrigation  Status:  Discontinued       As needed 08/12/18 1000 08/12/18 1149   08/12/18 0730  ceFAZolin (ANCEF) IVPB 2g/100 mL premix  Status:  Discontinued     2 g 200 mL/hr over 30 Minutes Intravenous On call to O.R. 08/12/18 7416 08/12/18 0722   08/12/18 0730  ceFAZolin (ANCEF) IVPB 2g/100 mL premix     2 g 200 mL/hr over 30 Minutes Intravenous On call to O.R. 08/12/18 0720 08/12/18 0928      Assessment/Plan: s/p Procedure(s): RIGHT MODIFIED RADICAL  MASTECTOMY RIGHT BREAST RECONSTRUCTION WITH PLACEMENT OF TISSUE EXPANDER AND FLEX HD (ACELLULAR HYDRATED DERMIS) AXILLARY LYMPH NODE DISSECTION Doing well postoperatively.  Pain control better.  Expect discharge today.   LOS: 1 day    Edward Jolly 08/14/2018

## 2018-08-14 NOTE — Progress Notes (Signed)
[name] to be D/C'd  per MD order. Discussed with the patient and all questions fully answered.  VSS, Skin clean, dry and intact without evidence of skin break down, no evidence of skin tears noted.  IV catheter discontinued intact. Site without signs and symptoms of complications. Dressing and pressure applied.  An After Visit Summary was printed and given to the patient. Patient educated on proper drain care with teach back.  D/c education completed with patient/family including follow up instructions, medication list, d/c activities limitations if indicated, with other d/c instructions as indicated by MD - patient able to verbalize understanding, all questions fully answered.   Patient instructed to return to ED, call 911, or call MD for any changes in condition.   Patient to be escorted via [place], and D/C home via private auto.

## 2018-08-16 NOTE — Discharge Summary (Signed)
Physician Discharge Summary  Patient ID: Jacqueline Good MRN: [id number] DOB/AGE: Apr 17, 1961 58 y.o.  Admit date: 08/12/2018 Discharge date: 08/16/2018  Admission Diagnoses: Breast cancer  Discharge Diagnoses:  Active Problems:   Breast cancer [facility])   Discharged Condition: good  Hospital Course: The patient was taken to the operating room and underwent a right mastectomy by general surgery.  She then had immediate reconstruction with expander and Flex HD placement.  A drain was placed.  She did well throughout the procedure.  She was managed on the postoperative surgery unit.  She had a fair amount of pain postop that was managed with IV pain medication.  Due to her allergies to hydrocodone there was not a great way to control her pain from the oral medication standpoint.  Her drain was working.  She was ambulating and eating without difficulty.  She was ready to go and discharged to home in the care of her family.  Consults: None  Significant Diagnostic Studies: none  Treatments: surgery  Discharge Exam: Blood pressure 110/71, pulse (!) 58, temperature 98.7 F (37.1 C), temperature source Oral, resp. rate 16, height 5\' 4"  (1.626 m), weight 89.8 kg, SpO2 97 %. General appearance: alert and cooperative  Disposition:    Allergies as of 08/14/2018      Reactions   Gadolinium Derivatives Itching, Cough   Pt began sneezing and coughing as soon as MRI contrast was injected. Severe nasal congestion. No rash or hives no SOB.    Hydrocodone Hives, Itching   Iodinated Diagnostic Agents Itching      Medication List    STOP taking these medications   cetirizine-pseudoephedrine 5-120 MG tablet Commonly known as:  ZYRTEC-D   naproxen 500 MG tablet Commonly known as:  NAPROSYN     TAKE these medications   albuterol 108 (90 Base) MCG/ACT inhaler Commonly known as:  VENTOLIN HFA INHALE 2 PUFFS INTO THE LUNGS EVERY 6 HOURS AS NEEDED FOR WHEEZING What changed:    how much to  take  how to take this  when to take this  reasons to take this  additional instructions   ALPRAZolam 1 MG tablet Commonly known as:  XANAX Take 1 mg by mouth 3 (three) times daily. for anxiety   busPIRone 30 MG tablet Commonly known as:  BUSPAR Take 30 mg by mouth 2 (two) times daily.   cholestyramine 4 g packet Commonly known as:  QUESTRAN Take 1 packet (4 g total) by mouth 2 (two) times daily. What changed:    when to take this  reasons to take this   dexamethasone 4 MG tablet Commonly known as:  DECADRON Take 2 tablets (8 mg total) by mouth 2 (two) times daily. Start the day before Taxotere. Take once the day after, then 2 times a day x 2d.   diphenoxylate-atropine 2.5-0.025 MG tablet Commonly known as:  LOMOTIL Take 1 tablet by mouth 4 (four) times daily as needed for diarrhea or loose stools.   DULoxetine 30 MG capsule Commonly known as:  CYMBALTA 3 po qd for 7 days, then 2 po qd for 7 days, then 1 po qd for 7 days then stop.   fluticasone 50 MCG/ACT nasal spray Commonly known as:  FLONASE Place 2 sprays into both nostrils daily.   Fluticasone-Salmeterol 250-50 MCG/DOSE Aepb Commonly known as:  ADVAIR DISKUS INHALE 1 PUFF INTO THE LUNGS 2 TIMES DAILY. What changed:    how much to take  how to take this  when to take  this  reasons to take this  additional instructions   HYDROcodone-acetaminophen 5-325 MG tablet Commonly known as:  NORCO Take 2 tablets by mouth every 6 (six) hours as needed for moderate pain.   lidocaine-prilocaine cream Commonly known as:  EMLA Apply to affected area once What changed:    how much to take  how to take this  when to take this  reasons to take this   loperamide 2 MG capsule Commonly known as:  IMODIUM Take 2 mg by mouth as needed for diarrhea or loose stools.   loratadine 10 MG tablet Commonly known as:  CLARITIN Take 10 mg by mouth daily as needed for allergies.   omeprazole 40 MG  capsule Commonly known as:  PRILOSEC Take 1 capsule (40 mg total) by mouth daily.   propranolol 20 MG tablet Commonly known as:  INDERAL Take 1 tablet (20 mg total) by mouth 3 (three) times daily as needed (palpitations).   venlafaxine 75 MG tablet Commonly known as:  EFFEXOR Take 2 qd until 08/07/18 and then start 3 po qd.   zolpidem 10 MG tablet Commonly known as:  AMBIEN Take 10 mg by mouth at bedtime.      Follow-up Information    Lillyana Majette, Loel Lofty, DO In 1 week.   Specialty:  Plastic Surgery Contact information: [place] [address] [phone]        Alphonsa Overall, MD In 2 weeks.   Specialty:  General Surgery Contact information: [place]  [id number] [phone]           Signed: Wallace Going 08/16/2018, 8:13 AM

## 2018-08-20 ENCOUNTER — Ambulatory Visit (INDEPENDENT_AMBULATORY_CARE_PROVIDER_SITE_OTHER): Payer: Medicaid Other | Admitting: Plastic Surgery

## 2018-08-20 ENCOUNTER — Encounter: Payer: Self-pay | Admitting: Plastic Surgery

## 2018-08-20 VITALS — BP 124/78 | HR 72 | Temp 99.0°F | Ht 64.0 in | Wt 198.0 lb

## 2018-08-20 DIAGNOSIS — Z9011 Acquired absence of right breast and nipple: Secondary | ICD-10-CM

## 2018-08-20 DIAGNOSIS — C50411 Malignant neoplasm of upper-outer quadrant of right female breast: Secondary | ICD-10-CM

## 2018-08-20 DIAGNOSIS — Z171 Estrogen receptor negative status [ER-]: Secondary | ICD-10-CM

## 2018-08-20 DIAGNOSIS — M545 Low back pain, unspecified: Secondary | ICD-10-CM

## 2018-08-20 DIAGNOSIS — G8929 Other chronic pain: Secondary | ICD-10-CM

## 2018-08-20 MED ORDER — HYDROCODONE-ACETAMINOPHEN 5-325 MG PO TABS: 1.0000 | ORAL_TABLET | Freq: Three times a day (TID) | ORAL | 0 refills | Status: DC | PRN

## 2018-08-20 MED ORDER — DIAZEPAM 2 MG PO TABS: 2.0000 mg | ORAL_TABLET | Freq: Four times a day (QID) | ORAL | 0 refills | Status: DC | PRN

## 2018-08-20 MED FILL — diazePAM 2 MG TABS: 2 | 5 days supply | Qty: 20 | Fill #0

## 2018-08-20 MED FILL — HYDROCODON-APAP 5-325: 5-325 | 2 days supply | Qty: 6 | Fill #0

## 2018-08-20 NOTE — Progress Notes (Signed)
   Subjective:    Patient ID: Jacqueline Good, female    DOB: 1961-07-17, 58 y.o.   MRN: [id number]  The patient is a follow-up after a right breast mastectomy with reconstruction.  She underwent expander and Flex HD placement.  She had 100 cc of saline placed in the 535 cc expander.  Skin is healing well.  There is no sign of infection, hematoma or seroma.  The drain is working well and output is as expected.   Review of Systems     Objective:   Physical Exam     Assessment & Plan:  Malignant neoplasm of upper-outer quadrant of right breast in female, estrogen receptor negative ([place])  Chronic bilateral low back pain without sciatica  S/P mastectomy, right  Acquired absence of right breast   She can go into a sports bra to nature.  Whenever is more comfortable.  Not expand family today. I placed a prescription for Valium and hydrocodone. I would like to see her in one week.  We will plan to expander and remove the drain.

## 2018-08-25 NOTE — Progress Notes (Signed)
[place]  [phone] [phone]   ID: Jacqueline Good DOB: [birth year]  MR#: [id number]  GYK#:[id number]  Patient Care Team: [name], MD as PCP - General (Internal Medicine) Alphonsa Overall, MD as Consulting Physician (General Surgery) Bradi Arbuthnot, [name], MD as Consulting Physician (Oncology) Eppie Gibson, MD as Attending Physician (Radiation Oncology) Dillingham, Loel Lofty, DO as Attending Physician (Plastic Surgery) OTHER MD: Donnal Moat, PA-C [FAX [phone]]   CHIEF COMPLAINT: Estrogen receptor negative breast cancer  CURRENT TREATMENT: Trastuzumab   INTERVAL HISTORY: Jacqueline Good returns today for follow-up and treatment of estrogen receptor negative breast cancer. She is accompanied by a friend or family member.  She completed neoadjuvant chemotherapy, consisting of carboplatin, gemcitabine, trastuzumab, and pertuzumab.   Jacqueline Good's last echocardiogram on 06/25/2018, showed an ejection fraction in the 55% - 60% range.   Her last breast MRI was on 07/09/2018.  Since her last visit here, she underwent a right modified radical mastectomy on 08/12/2018. The pathology from this procedure showed ([id number]):   1. Breast, radical mastectomy (including lymph nodes), right with axillary contents    - Fibrosis with inflammation and hemorrhage consistent with previous biopsy.   - No residual carcinoma identified   - Fibrocystic changes with sclerosing adenosis and calcifications   - Margins free of tumor   - Ten benign lymph nodes, one with focal fibrosis  2. Lymph node, biopsy, right highest axillary   - One benign lymph node   REVIEW OF SYSTEMS: Jacqueline Good states that she has been in pain post surgery. She has been feeling fatigue, she has had an appetite decrease, and she has had chest pain. She said at first, it felt like there was an organ moving around in her chest, and that it hurts when she bends over. She is on hydrocodone for pain,  but it is making her constipated; she is taking MiraLax to treat, with very little relief. She states that she is very depressed about the way she looks. She is currently draining about a full bulb a day and is worried about the tube coming out since she felt wetness at the insertion site. She has an appointment with Dr. Marla Roe on 09/03/2018. For activity, she has not done much recently, especially since that fatigue set in. The patient denies unusual headaches, visual changes, nausea, vomiting, or dizziness. There has been no unusual cough, phlegm production, or pleurisy. This been no change in bladder habits. The patient denies unexplained weight loss, rash, or fever. A detailed review of systems was otherwise noncontributory.     HISTORY OF CURRENT ILLNESS: From the original intake note:  Jacqueline Good had routine screening mammography on 02/05/2018 showing a possible abnormality in the right breast. She underwent unilateral right diagnostic mammography with tomography and right breast ultrasonography at [facility] on 02/15/2018 showing: Highly suspicious right breast mass at 10 o'clock position.  There was a second, 0.6 cm lesion at the 10:00 radiant which has not been biopsied.  Suspicious focal cortical thickening of a single right axillary lymph node.  Accordingly on 02/25/2018 she proceeded to biopsy of the right breast area in question. The pathology from this procedure showed ([id number]): Breast, right, needle core biopsy, OUQ with microscopic focus of invasive ductal carcinoma, grade 2, arising in a background of high grade ductal carcinoma in situ. 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   The patient's subsequent history is as detailed below.  PAST MEDICAL HISTORY: Past Medical History:  Diagnosis Date  . Anemia   . Anxiety   . Arthritis    "all over" (08/12/2018)  . Asthma   . Breast cancer, right breast  ([place]) dx'd 02/2018  . Chronic bronchitis ([place])   . Depression   . GERD (gastroesophageal reflux disease)   . History of blood transfusion    "several; related to low blood" (08/12/2018)    PAST SURGICAL HISTORY: Past Surgical History:  Procedure Laterality Date  . AXILLARY LYMPH NODE DISSECTION Right 08/12/2018   Procedure: AXILLARY LYMPH NODE DISSECTION;  Surgeon: Alphonsa Overall, MD;  Location: [facility];  Service: General;  Laterality: Right;  . BREAST BIOPSY Right 03/2018  . BREAST RECONSTRUCTION WITH PLACEMENT OF TISSUE EXPANDER AND FLEX HD (ACELLULAR HYDRATED DERMIS) Right 08/12/2018   Procedure: RIGHT BREAST RECONSTRUCTION WITH PLACEMENT OF TISSUE EXPANDER AND FLEX HD (ACELLULAR HYDRATED DERMIS);  Surgeon: Wallace Going, DO;  Location: [facility];  Service: Plastics;  Laterality: Right;  . DILATION AND CURETTAGE OF UTERUS    . ENDOMETRIAL ABLATION    . IR IMAGING GUIDED PORT INSERTION  03/18/2018  . MASTECTOMY MODIFIED RADICAL Right 08/12/2018   w/axillary LND  . MASTECTOMY MODIFIED RADICAL Right 08/12/2018   Procedure: RIGHT MODIFIED RADICAL MASTECTOMY;  Surgeon: Alphonsa Overall, MD;  Location: [facility];  Service: General;  Laterality: Right;  . MYOMECTOMY      FAMILY HISTORY Family History  Problem Relation Age of Onset  . Lupus Sister   . Heart disease Sister   . Breast cancer Mother    She notes that her father is currently 59 years old. Patients' mother is currently 64 years old and she was diagnosed with breast cancer at age 35. The patient has 4 brothers and 1 sister. She has a maternal aunt with breast cancer and her maternal great grandmother with had ovarian cancer.    GYNECOLOGIC HISTORY:  No LMP recorded. Patient is postmenopausal. Menarche: 58 years old Age at first live birth: 58 years old GX P: 1 LMP: 13-14 years ago s/p ablation Contraceptive: no HRT: no  Hysterectomy: no SO: no   SOCIAL HISTORY: She is currently unemployed.  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 She lives alone without pets. Her daughter is Jacqueline Good who lives in [place] and works in Therapist, art.  The patient has one grandchild. She goes to a [employer].   ADVANCED DIRECTIVES: Not in place.  At the 03/03/2018 visit the patient was given the appropriate documents to complete and notarized at her discretion   HEALTH MAINTENANCE: Social History   Tobacco Use  . Smoking status: Former Smoker    Packs/day: 1.00    Years: 38.00    Pack years: 38.00    Types: Cigarettes    Last attempt to quit: 05/15/2018    Years since quitting: 0.2  . Smokeless tobacco: Never Used  Substance Use Topics  . Alcohol use: Not Currently    Comment: 08/12/2018 "nothing since 04/2018"  . Drug use: No     Colonoscopy: Never  PAP:   Bone density: Never   Allergies  Allergen Reactions  . Gadolinium Derivatives Itching and Cough    Pt began sneezing and coughing as soon as MRI contrast was injected. Severe nasal congestion. No rash or hives no SOB.   Marland Kitchen Hydrocodone Hives and Itching  . Iodinated Diagnostic Agents Itching    Current Outpatient Medications  Medication Sig Dispense Refill  . albuterol (VENTOLIN  HFA) 108 (90 Base) MCG/ACT inhaler INHALE 2 PUFFS INTO THE LUNGS EVERY 6 HOURS AS NEEDED FOR WHEEZING (Patient taking differently: Inhale 2 puffs into the lungs every 6 (six) hours as needed for wheezing or shortness of breath. ) 54 g 3  . ALPRAZolam (XANAX) 1 MG tablet 1 po q 6 hr prn.  No more than 3.5 qd. 105 tablet 3  . busPIRone (BUSPAR) 30 MG tablet Take 1 tablet (30 mg total) by mouth 2 (two) times daily. 60 tablet 3  . dexamethasone (DECADRON) 4 MG tablet Take 2 tablets (8 mg total) by mouth 2 (two) times daily. Start the day before Taxotere. Take once the day after, then 2 times a day x 2d. (Patient not taking: Reported on 08/30/2018) 30 tablet 1  . diazepam (VALIUM) 2 MG tablet Take 1 tablet (2 mg total) by mouth every 12 (twelve) hours as needed  for up to 10 days for anxiety or muscle spasms. 20 tablet 0  . diphenoxylate-atropine (LOMOTIL) 2.5-0.025 MG tablet Take 1 tablet by mouth 4 (four) times daily as needed for diarrhea or loose stools. (Patient not taking: Reported on 08/30/2018) 30 tablet 0  . fluticasone (FLONASE) 50 MCG/ACT nasal spray Place 2 sprays into both nostrils daily. 1 g 0  . Fluticasone-Salmeterol (ADVAIR DISKUS) 250-50 MCG/DOSE AEPB INHALE 1 PUFF INTO THE LUNGS 2 TIMES DAILY. (Patient taking differently: Inhale 1 puff into the lungs 2 (two) times daily as needed (wheezing or shortness of breath). ) 60 each 2  . HYDROcodone-acetaminophen (NORCO) 5-325 MG tablet Take 1 tablet by mouth every 8 (eight) hours as needed for up to 7 days for moderate pain. 20 tablet 0  . lidocaine-prilocaine (EMLA) cream Apply to affected area once (Patient taking differently: Apply 1 application topically daily as needed (chemo or port flush). Apply to affected area once) 30 g 3  . loperamide (IMODIUM) 2 MG capsule Take 2 mg by mouth as needed for diarrhea or loose stools.    Marland Kitchen loratadine (CLARITIN) 10 MG tablet Take 10 mg by mouth daily as needed for allergies.    Marland Kitchen omeprazole (PRILOSEC) 40 MG capsule Take 1 capsule (40 mg total) by mouth daily. 30 capsule 2  . propranolol (INDERAL) 20 MG tablet Take 1 tablet (20 mg total) by mouth 3 (three) times daily as needed (palpitations). (Patient not taking: Reported on 08/30/2018) 90 tablet 0  . venlafaxine (EFFEXOR) 75 MG tablet Take 3 tablets (225 mg total) by mouth daily. Take 2 qd until 08/07/18 and then start 3 po qd. 90 tablet 3  . zolpidem (AMBIEN) 10 MG tablet Take 1 tablet (10 mg total) by mouth at bedtime as needed for sleep (1/2-1 tab). 30 tablet 3   No current facility-administered medications for this visit.     OBJECTIVE: Middle-aged African-American woman who appears stated age  89:   09/01/18 0940  BP: 111/61  Pulse: 80  Resp: 18  Temp: 98.5 F (36.9 C)  SpO2: 98%     Body  mass index is 33.78 kg/m.   Wt Readings from Last 3 Encounters:  09/01/18 196 lb 12.8 oz (89.3 kg)  08/27/18 198 lb (89.8 kg)  08/20/18 198 lb (89.8 kg)  ECOG FS:1 - Symptomatic but completely ambulatory  Sclerae unicteric, EOMs intact No cervical or supraclavicular adenopathy Lungs no rales or rhonchi Heart regular rate and rhythm Abd soft, nontender, positive bowel sounds MSK no focal spinal tenderness, no upper extremity lymphedema Neuro: nonfocal, well oriented, appropriate affect Breasts:  The right breast is status post mastectomy.  Drains are still in place.  I do not see significant erythema or swelling at the exit site.  The incision is healing nicely, with expander in place.  The left breast is benign.  Both axillae are benign.     LAB RESULTS:  CMP     Component Value Date/Time   NA 140 08/06/2018 1109   K 3.8 08/06/2018 1109   CL 109 08/06/2018 1109   CO2 23 08/06/2018 1109   GLUCOSE 87 08/06/2018 1109   BUN 19 08/06/2018 1109   CREATININE 0.89 08/06/2018 1109   CREATININE 0.80 03/03/2018 0830   CREATININE 0.83 09/24/2016 1151   CALCIUM 9.3 08/06/2018 1109   PROT 7.3 08/06/2018 1109   ALBUMIN 4.1 08/06/2018 1109   AST 23 08/06/2018 1109   AST 15 03/03/2018 0830   ALT 36 08/06/2018 1109   ALT 16 03/03/2018 0830   ALKPHOS 117 08/06/2018 1109   BILITOT 0.7 08/06/2018 1109   BILITOT 0.5 03/03/2018 0830   GFRNONAA >60 08/06/2018 1109   GFRNONAA >60 03/03/2018 0830   GFRNONAA 79 09/24/2016 1151   GFRAA >60 08/06/2018 1109   GFRAA >60 03/03/2018 0830   GFRAA >89 09/24/2016 1151    No results found for: TOTALPROTELP, ALBUMINELP, A1GS, A2GS, BETS, BETA2SER, GAMS, MSPIKE, SPEI  No results found for: KPAFRELGTCHN, LAMBDASER, KAPLAMBRATIO  Lab Results  Component Value Date   WBC 10.0 08/13/2018   NEUTROABS 4.3 08/06/2018   HGB 9.1 (L) 08/13/2018   HCT 28.9 (L) 08/13/2018   MCV 95.7 08/13/2018   PLT 177 08/13/2018    @LASTCHEMISTRY @  No results found  for: LABCA2  No components found for: [id number]  No results for input(s): INR in the last 168 hours.  No results found for: LABCA2  No results found for: YBW389  No results found for: HTD428  No results found for: JGO115  No results found for: CA2729  No components found for: HGQUANT  No results found for: CEA1 / No results found for: CEA1   No results found for: AFPTUMOR  No results found for: CHROMOGRNA  No results found for: PSA1  No visits with results within 3 Day(s) from this visit.  Latest known visit with results is:  Admission on 08/12/2018, Discharged on 08/14/2018  Component Date Value Ref Range Status  . WBC 08/12/2018 5.7  4.0 - 10.5 K/uL Final  . RBC 08/12/2018 3.64* 3.87 - 5.11 MIL/uL Final  . Hemoglobin 08/12/2018 10.9* 12.0 - 15.0 g/dL Final  . HCT 08/12/2018 34.9* 36.0 - 46.0 % Final  . MCV 08/12/2018 95.9  80.0 - 100.0 fL Final  . MCH 08/12/2018 29.9  26.0 - 34.0 pg Final  . MCHC 08/12/2018 31.2  30.0 - 36.0 g/dL Final  . RDW 08/12/2018 17.3* 11.5 - 15.5 % Final  . Platelets 08/12/2018 191  150 - 400 K/uL Final  . nRBC 08/12/2018 0.0  0.0 - 0.2 % Final   Performed at [facility], [place] [address] [place], [place] [address]  . ABO/RH(D) 08/12/2018 O POS   Final  . Antibody Screen 08/12/2018 NEG   Final  . Sample Expiration 08/12/2018 08/15/2018   Final  . ABO/RH(D) 08/12/2018 O POS   Final  . HIV Screen 4th Generation wRfx 08/12/2018 Non Reactive  Non Reactive Final   Comment: (NOTE) Performed At: [facility] [address] [place], [place] [id number] Rush Farmer MD AG:[id number]   . WBC 08/12/2018 9.7  4.0 -  10.5 K/uL Final  . RBC 08/12/2018 3.50* 3.87 - 5.11 MIL/uL Final  . Hemoglobin 08/12/2018 10.4* 12.0 - 15.0 g/dL Final  . HCT 08/12/2018 33.3* 36.0 - 46.0 % Final  . MCV 08/12/2018 95.1  80.0 - 100.0 fL Final  . MCH 08/12/2018 29.7  26.0 - 34.0 pg Final  . MCHC 08/12/2018 31.2  30.0 - 36.0 g/dL Final  . RDW 08/12/2018  17.2* 11.5 - 15.5 % Final  . Platelets 08/12/2018 168  150 - 400 K/uL Final  . nRBC 08/12/2018 0.0  0.0 - 0.2 % Final   Performed at [facility], [place] [address] [place], [place] [address]  . WBC 08/13/2018 10.0  4.0 - 10.5 K/uL Final  . RBC 08/13/2018 3.02* 3.87 - 5.11 MIL/uL Final  . Hemoglobin 08/13/2018 9.1* 12.0 - 15.0 g/dL Final  . HCT 08/13/2018 28.9* 36.0 - 46.0 % Final  . MCV 08/13/2018 95.7  80.0 - 100.0 fL Final  . MCH 08/13/2018 30.1  26.0 - 34.0 pg Final  . MCHC 08/13/2018 31.5  30.0 - 36.0 g/dL Final  . RDW 08/13/2018 17.4* 11.5 - 15.5 % Final  . Platelets 08/13/2018 177  150 - 400 K/uL Final  . nRBC 08/13/2018 0.0  0.0 - 0.2 % Final   Performed at [facility], [place] [address] [place], [place] [address]    (this displays the last labs from the last 3 days)  No results found for: TOTALPROTELP, ALBUMINELP, A1GS, A2GS, BETS, BETA2SER, GAMS, MSPIKE, SPEI (this displays SPEP labs)  No results found for: KPAFRELGTCHN, LAMBDASER, KAPLAMBRATIO (kappa/lambda light chains)  No results found for: HGBA, HGBA2QUANT, HGBFQUANT, HGBSQUAN (Hemoglobinopathy evaluation)   No results found for: LDH  No results found for: IRON, TIBC, IRONPCTSAT (Iron and TIBC)  No results found for: FERRITIN  Urinalysis    Component Value Date/Time   COLORURINE CANCELED 09/24/2016 1151   APPEARANCEUR CANCELED 09/24/2016 1151   LABSPEC CANCELED 09/24/2016 1151   PHURINE CANCELED 09/24/2016 1151   GLUCOSEU CANCELED 09/24/2016 1151   HGBUR CANCELED 09/24/2016 1151   BILIRUBINUR CANCELED 09/24/2016 1151   KETONESUR CANCELED 09/24/2016 1151   PROTEINUR CANCELED 09/24/2016 1151   NITRITE CANCELED 09/24/2016 1151   LEUKOCYTESUR CANCELED 09/24/2016 1151     STUDIES:  No results found.   ELIGIBLE FOR AVAILABLE RESEARCH PROTOCOL:BCEP   ASSESSMENT: 58 y.o. [place] woman status post right breast upper outer quadrant biopsy 02/25/2018 for a clinical T2 pN1, stage IIB invasive  ductal carcinoma, estrogen and progesterone receptor negative, HER-2 amplified, with an MIB-1 of 50%.  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   (1) genetics testing pending  (2) neoadjuvant chemotherapy will consist of carboplatin, docetaxel, trastuzumab and Pertuzumab every 21 days x 6 starting 03/23/2018  (a) pertuzumab omitted after cycle 1 due to poorly-controlled diarrhea  (b) docetaxel discontinued after cycle 3 due to neuropathy; gemcitabine substituted  (c) Granix substituted for Neulasta on days 3-6 following chemotherapy due to improved tolerance  (d) Switched back to Neulasta after cycles 5 and 6 per patient request  (3) anti-HER-2 treatment will continue for a minimum of 6 months  (a) baseline echocardiogram 03/16/2018 shows an ejection fraction in the 55-60% range.  (b) echo on 11/15 shows EF of 55-60%  (4) status post right modified radical mastectomy 08/12/2018 showing a complete pathologic response (ypT0 ypN0)  (a) a total of 11 regional lymph nodes removed  (b) expander in place  (5) adjuvant  radiation pending  (6) left adrenal adenoma measuring 2.9 cm on CT of the chest 03/16/2018   PLAN:  Jacqueline Good had the best possible response to her chemotherapy and she understands this predicts a good long-term prognosis.  I was delighted for her.  She is having a great deal of pain and distress related to her surgery.  There has been some leakage from the drain and of course she is still draining a fair amount.  Her pain is moderately well controlled and she is constipated secondary to the pain medication.  We discussed some of these issues today.  She is scheduled to see her general surgeon Dr. Lucia Gaskins tomorrow and her plastic surgeon Dr. Marla Roe on Friday.  Today we discussed the data suggesting that 6 months of trastuzumab is as good as 12.  More recent reviews of the European study has pointed out that they use different  chemotherapy than we do an outpatient cannot simply assume they will get the same result as the European study suggests.  Jacqueline Good is agreeable to continuing trastuzumab for full year and I think that is the safest bet for her.  I have requested a meeting with genetics sometime when she comes in for treatment.  Otherwise she will return to see me in 6 weeks.  She knows to call for any other issue that may develop before her next visit here.    Jacqueline Good, [name], MD  09/01/18 10:04 AM Medical Oncology and Hematology [facility] [address] [place], [place] [address] Tel. [phone]    Fax. [phone]  I, Jacqualyn Posey am acting as a Education administrator for Chauncey Cruel, MD.   I, Lurline Del MD, have reviewed the above documentation for accuracy and completeness, and I agree with the above.

## 2018-08-27 ENCOUNTER — Encounter: Payer: Self-pay | Admitting: Plastic Surgery

## 2018-08-27 ENCOUNTER — Telehealth: Payer: Self-pay | Admitting: Licensed Clinical Social Worker

## 2018-08-27 ENCOUNTER — Ambulatory Visit (INDEPENDENT_AMBULATORY_CARE_PROVIDER_SITE_OTHER): Payer: Medicaid Other | Admitting: Plastic Surgery

## 2018-08-27 ENCOUNTER — Ambulatory Visit: Payer: Medicaid Other

## 2018-08-27 VITALS — BP 136/89 | HR 83 | Temp 98.2°F | Ht 64.0 in | Wt 198.0 lb

## 2018-08-27 DIAGNOSIS — Z9011 Acquired absence of right breast and nipple: Secondary | ICD-10-CM

## 2018-08-27 MED ORDER — DIAZEPAM 2 MG PO TABS: 2.0000 mg | ORAL_TABLET | Freq: Two times a day (BID) | ORAL | 0 refills | Status: AC | PRN

## 2018-08-27 MED ORDER — HYDROCODONE-ACETAMINOPHEN 5-325 MG PO TABS: 1.0000 | ORAL_TABLET | Freq: Three times a day (TID) | ORAL | 0 refills | Status: AC | PRN

## 2018-08-27 MED FILL — HYDROCODON-APAP 5-325: 5-325 | 7 days supply | Qty: 20 | Fill #0

## 2018-08-27 MED FILL — diazePAM 2 MG TABS: 2 | 10 days supply | Qty: 20 | Fill #0

## 2018-08-27 NOTE — Telephone Encounter (Signed)
Attempted to contact pt. No answer/left message.  °

## 2018-08-27 NOTE — Progress Notes (Addendum)
   Subjective:    Patient ID: Jacqueline Good, female    DOB: [birth year], 58 y.o.   MRN: [id number]  Jacqueline Good is a 58 year old female here for follow-up after her right breast reconstruction.  She underwent a mastectomy with expander placement.  She was a little bit tearful today but better than she was last week.  The drain output is still too high for removal.  But it looks normal serosanguineous.  I doubt feel any seromas or hematomas in the breast pocket.  There is no sign of infection.  And the incision is healing well.  The patient states she is trying to eat healthy and overall doing well.     Review of Systems     Objective:   Physical Exam        Assessment & Plan:  S/P mastectomy, right  Acquired absence of right breast  We placed injectable saline in the Expander using a sterile technique: Right: 70 cc for a total of 170 / 535 cc Valium and Norco sent into the pharmacy. We will plan to remove the drain next week.

## 2018-08-27 NOTE — Telephone Encounter (Signed)
Pt returned call. Informed pt about full coverage medicaid. Pt was upset and stated that [facility] does not accept medicaid. Pt states that she was unaware about having full coverage medicaid. Pt was receiving CAFA and was concerned about recent bills from [facility] that were not covered. Informed pt about other behavioral health resources that accept medicaid. Pt was uninterested. Pt will contact social services to get medicaid ID.

## 2018-08-27 NOTE — Addendum Note (Signed)
Addended by: Wallace Going on: 08/27/2018 10:50 AM   Modules accepted: Orders

## 2018-08-30 ENCOUNTER — Encounter: Payer: Self-pay | Admitting: Physician Assistant

## 2018-08-30 ENCOUNTER — Telehealth: Payer: Self-pay | Admitting: Physician Assistant

## 2018-08-30 ENCOUNTER — Ambulatory Visit (INDEPENDENT_AMBULATORY_CARE_PROVIDER_SITE_OTHER): Payer: Medicaid Other | Admitting: Physician Assistant

## 2018-08-30 DIAGNOSIS — F331 Major depressive disorder, recurrent, moderate: Secondary | ICD-10-CM

## 2018-08-30 DIAGNOSIS — G4700 Insomnia, unspecified: Secondary | ICD-10-CM

## 2018-08-30 DIAGNOSIS — F411 Generalized anxiety disorder: Secondary | ICD-10-CM

## 2018-08-30 MED ORDER — ZOLPIDEM TARTRATE 10 MG PO TABS: 10.0000 mg | ORAL_TABLET | Freq: Every evening | ORAL | 3 refills | Status: DC | PRN

## 2018-08-30 MED ORDER — ALPRAZOLAM 1 MG PO TABS: ORAL_TABLET | ORAL | 3 refills | Status: DC

## 2018-08-30 MED ORDER — BUSPIRONE HCL 30 MG PO TABS: 30.0000 mg | ORAL_TABLET | Freq: Two times a day (BID) | ORAL | 3 refills | Status: DC

## 2018-08-30 MED ORDER — VENLAFAXINE HCL 75 MG PO TABS: 225.0000 mg | ORAL_TABLET | Freq: Every day | ORAL | 3 refills | Status: DC

## 2018-08-30 NOTE — Telephone Encounter (Signed)
Pt asks that you resend the RX from today for Buspar and Venlafaxine to [facility] instead as they are cheaper there.  Thanks

## 2018-08-30 NOTE — Progress Notes (Signed)
[facility]  Patient ID: Jacqueline Good,  MRN: [id number]  PCP: [name], MD  Date of Evaluation: 08/30/2018 Time spent:25 minutes  Chief Complaint:  Chief Complaint    Follow-up      HISTORY/CURRENT STATUS: HPI  Here for routine med check.   Since LOV, had right mastectomy.  "looking at myself without a breast and bald-headed is not pretty." Tearfully, she said she's cancer free, but so depressed. Will be seeing Onc on Wed and they'll set up XRT.  Has been anemic and had another transfusion since visit.  Was given Valium just to help with the muscle spasms from the mastectomy.  She is not taking it with the Xanax. Another thing that has thrown her for a loop is that she's found out that she has Medicaid and our office is unable to accept that insurance.  So she is going to have to change providers.  Tearfully states it is hard to tell if she is not feeling like doing anything because she is depressed or whether it is due to recovering from the recent surgery.  Is only been about 2 weeks since she had a mastectomy.  She still feels very tired and is sore from the surgery. She cries a lot and is having a hard time accepting what is going on.  Anxiety is mostly controlled but she does need to have the Xanax 3 times a day and sometimes an extra half.  Individual Medical History/ Review of Systems: Changes? :Yes    Past medications for mental health diagnoses include: I do not have her old chart and unsure of her past medications.  Cymbalta is one  Allergies: Gadolinium derivatives; Hydrocodone; and Iodinated diagnostic agents  Current Medications:  Current Outpatient Medications:  .  albuterol (VENTOLIN HFA) 108 (90 Base) MCG/ACT inhaler, INHALE 2 PUFFS INTO THE LUNGS EVERY 6 HOURS AS NEEDED FOR WHEEZING (Patient taking differently: Inhale 2 puffs into the lungs every 6 (six) hours as needed for wheezing or shortness of breath. ), Disp: 54 g, Rfl: 3 .   ALPRAZolam (XANAX) 1 MG tablet, 1 po q 6 hr prn.  No more than 3.5 qd., Disp: 105 tablet, Rfl: 3 .  busPIRone (BUSPAR) 30 MG tablet, Take 1 tablet (30 mg total) by mouth 2 (two) times daily., Disp: 60 tablet, Rfl: 3 .  diazepam (VALIUM) 2 MG tablet, Take 1 tablet (2 mg total) by mouth every 12 (twelve) hours as needed for up to 10 days for anxiety or muscle spasms., Disp: 20 tablet, Rfl: 0 .  fluticasone (FLONASE) 50 MCG/ACT nasal spray, Place 2 sprays into both nostrils daily., Disp: 1 g, Rfl: 0 .  Fluticasone-Salmeterol (ADVAIR DISKUS) 250-50 MCG/DOSE AEPB, INHALE 1 PUFF INTO THE LUNGS 2 TIMES DAILY. (Patient taking differently: Inhale 1 puff into the lungs 2 (two) times daily as needed (wheezing or shortness of breath). ), Disp: 60 each, Rfl: 2 .  HYDROcodone-acetaminophen (NORCO) 5-325 MG tablet, Take 1 tablet by mouth every 8 (eight) hours as needed for up to 7 days for moderate pain., Disp: 20 tablet, Rfl: 0 .  lidocaine-prilocaine (EMLA) cream, Apply to affected area once (Patient taking differently: Apply 1 application topically daily as needed (chemo or port flush). Apply to affected area once), Disp: 30 g, Rfl: 3 .  omeprazole (PRILOSEC) 40 MG capsule, Take 1 capsule (40 mg total) by mouth daily., Disp: 30 capsule, Rfl: 2 .  venlafaxine (EFFEXOR) 75 MG tablet, Take 3 tablets (225 mg  total) by mouth daily. Take 2 qd until 08/07/18 and then start 3 po qd., Disp: 90 tablet, Rfl: 3 .  zolpidem (AMBIEN) 10 MG tablet, Take 1 tablet (10 mg total) by mouth at bedtime as needed for sleep (1/2-1 tab)., Disp: 30 tablet, Rfl: 3 .  dexamethasone (DECADRON) 4 MG tablet, Take 2 tablets (8 mg total) by mouth 2 (two) times daily. Start the day before Taxotere. Take once the day after, then 2 times a day x 2d. (Patient not taking: Reported on 08/30/2018), Disp: 30 tablet, Rfl: 1 .  diphenoxylate-atropine (LOMOTIL) 2.5-0.025 MG tablet, Take 1 tablet by mouth 4 (four) times daily as needed for diarrhea or loose  stools. (Patient not taking: Reported on 08/30/2018), Disp: 30 tablet, Rfl: 0 .  loperamide (IMODIUM) 2 MG capsule, Take 2 mg by mouth as needed for diarrhea or loose stools., Disp: , Rfl:  .  loratadine (CLARITIN) 10 MG tablet, Take 10 mg by mouth daily as needed for allergies., Disp: , Rfl:  .  propranolol (INDERAL) 20 MG tablet, Take 1 tablet (20 mg total) by mouth 3 (three) times daily as needed (palpitations). (Patient not taking: Reported on 08/30/2018), Disp: 90 tablet, Rfl: 0 Medication Side Effects: nausea  Family Medical/ Social History: Changes? No  MENTAL HEALTH EXAM:  There were no vitals taken for this visit.There is no height or weight on file to calculate BMI.  General Appearance: Casual, Well Groomed and Obese  Eye Contact:  Good  Speech:  Clear and Coherent  Volume:  Normal  Mood:  Depressed  Affect:  Depressed  Thought Process:  Goal Directed  Orientation:  Full (Time, Place, and Person)  Thought Content: Logical   Suicidal Thoughts:  No  Homicidal Thoughts:  No  Memory:  WNL  Judgement:  Good  Insight:  Good  Psychomotor Activity:  Normal  Concentration:  Concentration: Good  Recall:  Good  Fund of Knowledge: Good  Language: Good  Assets:  Desire for Improvement  ADL's:  Intact  Cognition: WNL  Prognosis:  Good    DIAGNOSES:    ICD-10-CM   1. Major depressive disorder, recurrent episode, moderate (HCC) F33.1   2. Generalized anxiety disorder F41.1   3. Insomnia, unspecified type G47.00   Breast cancer status post chemotherapy with mastectomy approximately 2 weeks ago.  Receiving Psychotherapy: No    RECOMMENDATIONS: Continue BuSpar 30 mg p.o. twice daily. Continue Xanax 1 mg every 6 hours as needed but no more than 3.5 mg daily. Continue Effexor 75 mg 3 p.o. daily Continue Ambien 10 mg nightly as needed. Since she will be seeing a new provider soon and I will not be able to follow-up with her, I do not want to change any medications at this point.   Plus many of her issues are circumstantial.  Recommend counseling to help her through this difficult time. I have given her the names of several providers at [facility] behavioral health and we will do a referral there if needed.   I wish her the best and if she ever needs to come back here I will be glad to see her.  Donnal Moat, PA-C

## 2018-08-31 ENCOUNTER — Other Ambulatory Visit: Payer: Self-pay | Admitting: Physician Assistant

## 2018-08-31 MED ORDER — BUSPIRONE HCL 30 MG PO TABS: 30.0000 mg | ORAL_TABLET | Freq: Two times a day (BID) | ORAL | 3 refills | Status: DC

## 2018-08-31 MED ORDER — VENLAFAXINE HCL 75 MG PO TABS: 225.0000 mg | ORAL_TABLET | Freq: Every day | ORAL | 3 refills | Status: DC

## 2018-08-31 NOTE — Telephone Encounter (Signed)
Done

## 2018-09-01 ENCOUNTER — Inpatient Hospital Stay (HOSPITAL_BASED_OUTPATIENT_CLINIC_OR_DEPARTMENT_OTHER): Payer: Medicaid Other | Admitting: Oncology

## 2018-09-01 ENCOUNTER — Inpatient Hospital Stay: Payer: Medicaid Other

## 2018-09-01 ENCOUNTER — Other Ambulatory Visit: Payer: Self-pay | Admitting: *Deleted

## 2018-09-01 ENCOUNTER — Inpatient Hospital Stay: Payer: Medicaid Other | Attending: Oncology

## 2018-09-01 VITALS — BP 111/61 | HR 80 | Temp 98.5°F | Resp 18 | Ht 64.0 in | Wt 196.8 lb

## 2018-09-01 DIAGNOSIS — Z5112 Encounter for antineoplastic immunotherapy: Secondary | ICD-10-CM | POA: Insufficient documentation

## 2018-09-01 DIAGNOSIS — Z171 Estrogen receptor negative status [ER-]: Secondary | ICD-10-CM

## 2018-09-01 DIAGNOSIS — R079 Chest pain, unspecified: Secondary | ICD-10-CM

## 2018-09-01 DIAGNOSIS — C50411 Malignant neoplasm of upper-outer quadrant of right female breast: Secondary | ICD-10-CM

## 2018-09-01 DIAGNOSIS — F329 Major depressive disorder, single episode, unspecified: Secondary | ICD-10-CM

## 2018-09-01 DIAGNOSIS — Z8041 Family history of malignant neoplasm of ovary: Secondary | ICD-10-CM

## 2018-09-01 DIAGNOSIS — C773 Secondary and unspecified malignant neoplasm of axilla and upper limb lymph nodes: Secondary | ICD-10-CM | POA: Diagnosis not present

## 2018-09-01 DIAGNOSIS — Z87891 Personal history of nicotine dependence: Secondary | ICD-10-CM

## 2018-09-01 DIAGNOSIS — R5383 Other fatigue: Secondary | ICD-10-CM

## 2018-09-01 DIAGNOSIS — D3502 Benign neoplasm of left adrenal gland: Secondary | ICD-10-CM

## 2018-09-01 DIAGNOSIS — Z803 Family history of malignant neoplasm of breast: Secondary | ICD-10-CM

## 2018-09-01 DIAGNOSIS — K5900 Constipation, unspecified: Secondary | ICD-10-CM

## 2018-09-01 LAB — CBC WITH DIFFERENTIAL (CANCER CENTER ONLY)
Abs Immature Granulocytes: 0.03 10*3/uL (ref 0.00–0.07)
Basophils Absolute: 0 10*3/uL (ref 0.0–0.1)
Basophils Relative: 0 %
Eosinophils Absolute: 0.1 10*3/uL (ref 0.0–0.5)
Eosinophils Relative: 2 %
HCT: 29.5 % — ABNORMAL LOW (ref 36.0–46.0)
Hemoglobin: 9.8 g/dL — ABNORMAL LOW (ref 12.0–15.0)
Immature Granulocytes: 0 %
Lymphocytes Relative: 16 %
Lymphs Abs: 1.4 10*3/uL (ref 0.7–4.0)
MCH: 30.5 pg (ref 26.0–34.0)
MCHC: 33.2 g/dL (ref 30.0–36.0)
MCV: 91.9 fL (ref 80.0–100.0)
MONO ABS: 0.5 10*3/uL (ref 0.1–1.0)
Monocytes Relative: 6 %
Neutro Abs: 6.5 10*3/uL (ref 1.7–7.7)
Neutrophils Relative %: 76 %
Platelet Count: 173 10*3/uL (ref 150–400)
RBC: 3.21 MIL/uL — AB (ref 3.87–5.11)
RDW: 15.6 % — ABNORMAL HIGH (ref 11.5–15.5)
WBC: 8.6 10*3/uL (ref 4.0–10.5)
nRBC: 0 % (ref 0.0–0.2)

## 2018-09-01 MED ORDER — TRASTUZUMAB CHEMO 150 MG IV SOLR
6.0000 mg/kg | Freq: Once | INTRAVENOUS | Status: AC
  Administered 2018-09-01: 588 mg via INTRAVENOUS
  Filled 2018-09-01: qty 28

## 2018-09-01 MED ORDER — ACETAMINOPHEN 325 MG PO TABS
ORAL_TABLET | ORAL | Status: AC
  Filled 2018-09-01: qty 2

## 2018-09-01 MED ORDER — DIPHENHYDRAMINE HCL 25 MG PO CAPS
25.0000 mg | ORAL_CAPSULE | Freq: Once | ORAL | Status: AC
  Administered 2018-09-01: 25 mg via ORAL

## 2018-09-01 MED ORDER — DIPHENHYDRAMINE HCL 25 MG PO CAPS
ORAL_CAPSULE | ORAL | Status: AC
  Filled 2018-09-01: qty 1

## 2018-09-01 MED ORDER — LIDOCAINE-PRILOCAINE 2.5-2.5 % EX CREA: TOPICAL_CREAM | CUTANEOUS | 3 refills | Status: DC

## 2018-09-01 MED ORDER — HEPARIN SOD (PORK) LOCK FLUSH 100 UNIT/ML IV SOLN
500.0000 [IU] | Freq: Once | INTRAVENOUS | Status: AC | PRN
  Administered 2018-09-01: 500 [IU]
  Filled 2018-09-01: qty 5

## 2018-09-01 MED ORDER — ACETAMINOPHEN 325 MG PO TABS
650.0000 mg | ORAL_TABLET | Freq: Once | ORAL | Status: AC
  Administered 2018-09-01: 650 mg via ORAL

## 2018-09-01 MED ORDER — SODIUM CHLORIDE 0.9% FLUSH
10.0000 mL | INTRAVENOUS | Status: DC | PRN
  Administered 2018-09-01: 10 mL
  Filled 2018-09-01: qty 10

## 2018-09-01 MED ORDER — SODIUM CHLORIDE 0.9 % IV SOLN
Freq: Once | INTRAVENOUS | Status: AC
  Administered 2018-09-01: 12:00:00 via INTRAVENOUS
  Filled 2018-09-01: qty 250

## 2018-09-01 NOTE — Patient Instructions (Signed)
[facility] Discharge Instructions for Patients Receiving Chemotherapy Today you received the following chemotherapy agents:  Herceptin To help prevent nausea and vomiting after your treatment, we encourage you to take your nausea medication as prescribed.   If you develop nausea and vomiting that is not controlled by your nausea medication, call the clinic.   BELOW ARE SYMPTOMS THAT SHOULD BE REPORTED IMMEDIATELY:  *FEVER GREATER THAN 100.5 F  *CHILLS WITH OR WITHOUT FEVER  NAUSEA AND VOMITING THAT IS NOT CONTROLLED WITH YOUR NAUSEA MEDICATION  *UNUSUAL SHORTNESS OF BREATH  *UNUSUAL BRUISING OR BLEEDING  TENDERNESS IN MOUTH AND THROAT WITH OR WITHOUT PRESENCE OF ULCERS  *URINARY PROBLEMS  *BOWEL PROBLEMS  UNUSUAL RASH Items with * indicate a potential emergency and should be followed up as soon as possible.  Feel free to call the clinic should you have any questions or concerns. The clinic phone number is [phone].  Please show the CHEMO ALERT CARD at check-in to the Emergency Department and triage nurse.   

## 2018-09-02 ENCOUNTER — Ambulatory Visit: Payer: Medicaid Other | Admitting: Physician Assistant

## 2018-09-02 ENCOUNTER — Other Ambulatory Visit: Payer: Self-pay | Admitting: Genetic Counselor

## 2018-09-02 DIAGNOSIS — C50411 Malignant neoplasm of upper-outer quadrant of right female breast: Secondary | ICD-10-CM

## 2018-09-02 DIAGNOSIS — Z171 Estrogen receptor negative status [ER-]: Secondary | ICD-10-CM

## 2018-09-03 ENCOUNTER — Encounter: Payer: Self-pay | Admitting: Plastic Surgery

## 2018-09-03 ENCOUNTER — Ambulatory Visit (INDEPENDENT_AMBULATORY_CARE_PROVIDER_SITE_OTHER): Payer: Medicaid Other | Admitting: Plastic Surgery

## 2018-09-03 VITALS — BP 125/83 | HR 73 | Temp 98.5°F | Ht 64.0 in | Wt 196.0 lb

## 2018-09-03 DIAGNOSIS — Z9011 Acquired absence of right breast and nipple: Secondary | ICD-10-CM

## 2018-09-03 NOTE — Progress Notes (Signed)
   Subjective:    Patient ID: Jacqueline Good, female    DOB: Jul 15, 1961, 58 y.o.   MRN: [id number]  The patient is a 58 year old black female here for follow-up on her right breast reconstruction.  Expander is in place.  She does have some fluid in the breast pocket.  The drain is not charge so was not draining well.  However her skin is much softer.  There does not appear to be any hematoma.  There is no sign of infection.  And she was doing better today.  She was less tearful.  We may want to talk about the a light support group on her next visit.     Review of Systems  Constitutional: Negative.   HENT: Negative.   Eyes: Negative.   Respiratory: Negative.   Gastrointestinal: Negative.  Negative for abdominal distention.  Genitourinary: Negative.   Musculoskeletal: Negative.   Skin: Negative for wound.       Objective:   Physical Exam Vitals signs and nursing note reviewed.  Constitutional:      Appearance: Normal appearance.  HENT:     Head: Normocephalic.     Nose: Nose normal.     Mouth/Throat:     Mouth: Mucous membranes are moist.  Eyes:     Pupils: Pupils are equal, round, and reactive to light.  Neck:     Musculoskeletal: Normal range of motion.  Cardiovascular:     Rate and Rhythm: Normal rate.  Pulmonary:     Effort: Pulmonary effort is normal.  Abdominal:     General: Abdomen is flat. There is no distension.     Tenderness: There is no abdominal tenderness.  Neurological:     Mental Status: She is alert.  Psychiatric:        Mood and Affect: Mood normal.        Thought Content: Thought content normal.        Judgment: Judgment normal.        Assessment & Plan:  Acquired absence of right breast  S/P mastectomy, right We are not able to take the drain out today as it had not been charged.  I charged it and got over 100 cc out.  Instructions given for how to work with the drain to the patient. We placed injectable saline in the Expander using a  sterile technique: Right: 50 cc for a total of 220 / 535 cc We may want to talk about the a light support group on her next visit.

## 2018-09-06 NOTE — Progress Notes (Signed)
Location of Breast Cancer: Right Breast  Histology per Pathology Report:  02/25/18 Diagnosis 1. 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 SEE NOTE. 2. Lymph node, needle/core biopsy, right axilla, axillary LN - METASTATIC BREAST CARCINOMA TO LYMPH NODE.  2.Receptor Status: ER(NEG), PR (NEG), Her2-neu (POS), Ki-(50%)  03/31/18 Diagnosis 1. Breast, right, needle core biopsy, posterior - DUCTAL CARCINOMA IN SITU WITH NECROSIS, SEE COMMENT. 2. Breast, right, needle core biopsy, anterior - DUCTAL CARCINOMA IN SITU WITH FOCI SUSPICIOUS FOR MICROINVASION, SEE COMMENT.  2.Receptor Status: ER(NEG), PR(NEG)  08/12/18 Diagnosis 1. Breast, radical mastectomy (including lymph nodes), right with axillary contents - FIBROSIS WITH INFLAMMATION AND HEMORRHAGE CONSISTENT WITH PREVIOUS BIOPSY. - NO RESIDUAL CARCINOMA IDENTIFIED. - FIBROCYSTIC CHANGES WITH SCLEROSING ADENOSIS AND CALCIFICATIONS. - MARGINS FREE OF TUMOR. - TEN BENIGN LYMPH NODES, ONE WITH FOCAL FIBROSIS. 2. Lymph node, biopsy, Right Highest Axillary - ONE BENIGN LYMPH NODE.  Did patient present with symptoms or was this found on screening mammography?: She reported pain to her right breast and went to her MD who followed up with a mammogram.   Past/Anticipated interventions by surgeon, if any: 08/12/18 PROCEDURE:   Procedure(s): RIGHT MODIFIED RADICAL MASTECTOMY,  RIGHT BREAST RECONSTRUCTION WITH PLACEMENT OF TISSUE EXPANDER AND FLEX HD ([place]) SURGEON:   Alphonsa Overall, M.D.  08/12/18 PROCEDURE:  1. Right immediate breast reconstruction with placement of Acellular Dermal Matrix and tissue expanders. SURGEON: Claire Sanger Dillingham, DO  Past/Anticipated interventions by medical oncology, if any: 09/01/18 Dr. Jana Hakim (1) genetics testing pending (2) neoadjuvant chemotherapy will consist of  carboplatin, docetaxel, trastuzumab and Pertuzumab every 21 days x 6 starting 03/23/2018             (a) pertuzumab omitted after cycle 1 due to poorly-controlled diarrhea             (b) docetaxel discontinued after cycle 3 due to neuropathy; gemcitabine substituted             (c) Granix substituted for Neulasta on days 3-6 following chemotherapy due to improved tolerance             (d) Switched back to Neulasta after cycles 5 and 6 per patient request (3) anti-HER-2 treatment will continue for a minimum of 6 months             (a) baseline echocardiogram 03/16/2018 shows an ejection fraction in the 55-60% range.             (b) echo on 11/15 shows EF of 55-60% (4) status post right modified radical mastectomy 08/12/2018 showing a complete pathologic response (ypT0 ypN0)             (a) a total of 11 regional lymph nodes removed             (b) expander in place (5) adjuvant radiation pending (6) left adrenal adenoma measuring 2.9 cm on CT of the chest 03/16/2018  Lymphedema issues, if any: She reports soreness to her right arm. She is not able to bring her right arm up past her right shoulder today.   Pain issues, if any:  She reports back pain a 7/10  SAFETY ISSUES:  Prior radiation? No  Pacemaker/ICD? No  Possible current pregnancy? No  Is the patient on methotrexate? No  Current Complaints / other details:    BP 112/79 (BP Location: Left Arm, Patient Position: Sitting)  Pulse 64   Temp 98.4 F (36.9 C) (Oral)   Resp 20   Ht 5' 4.5" (1.638 m)   Wt 196 lb 6.4 oz (89.1 kg)   SpO2 100%   BMI 33.19 kg/m    Wt Readings from Last 3 Encounters:  09/07/18 196 lb 6.4 oz (89.1 kg)  09/03/18 196 lb (88.9 kg)  09/01/18 196 lb 12.8 oz (89.3 kg)      Jacqueline Good, Stephani Police, RN 09/06/2018,8:39 AM

## 2018-09-07 ENCOUNTER — Encounter: Payer: Self-pay | Admitting: Radiation Oncology

## 2018-09-07 ENCOUNTER — Ambulatory Visit (INDEPENDENT_AMBULATORY_CARE_PROVIDER_SITE_OTHER): Payer: Medicaid Other | Admitting: Plastic Surgery

## 2018-09-07 ENCOUNTER — Ambulatory Visit
Admission: RE | Admit: 2018-09-07 | Discharge: 2018-09-07 | Disposition: A | Discharge status: Home / Self Care | Payer: Medicaid Other | Source: Ambulatory Visit | Attending: Radiation Oncology | Admitting: Radiation Oncology

## 2018-09-07 ENCOUNTER — Encounter: Payer: Self-pay | Admitting: Plastic Surgery

## 2018-09-07 ENCOUNTER — Other Ambulatory Visit: Payer: Self-pay

## 2018-09-07 VITALS — BP 124/84 | HR 75 | Temp 98.0°F | Resp 16

## 2018-09-07 VITALS — BP 112/79 | HR 64 | Temp 98.4°F | Resp 20 | Ht 64.5 in | Wt 196.4 lb

## 2018-09-07 DIAGNOSIS — C50411 Malignant neoplasm of upper-outer quadrant of right female breast: Secondary | ICD-10-CM

## 2018-09-07 DIAGNOSIS — Z87891 Personal history of nicotine dependence: Secondary | ICD-10-CM | POA: Diagnosis not present

## 2018-09-07 DIAGNOSIS — Z79899 Other long term (current) drug therapy: Secondary | ICD-10-CM | POA: Diagnosis not present

## 2018-09-07 DIAGNOSIS — Z9221 Personal history of antineoplastic chemotherapy: Secondary | ICD-10-CM | POA: Insufficient documentation

## 2018-09-07 DIAGNOSIS — Z9011 Acquired absence of right breast and nipple: Secondary | ICD-10-CM

## 2018-09-07 DIAGNOSIS — Z171 Estrogen receptor negative status [ER-]: Secondary | ICD-10-CM | POA: Insufficient documentation

## 2018-09-07 NOTE — Progress Notes (Signed)
   Subjective:    Patient ID: [name], female    DOB: 01/06/1961, 58 y.o.   MRN: [id number]  The patient is a 58 year old bf here for a follow-up on her right breast reconstruction.  The drain came out at home accidentally.  The output was minimal and serosanguineous.  The site looks good and clean.  No sign of infection. She is expanding nicely and tolerating it well.  The incision is healing well.  No hematoma or seroma noted.    Review of Systems  Constitutional: Negative.   HENT: Negative.   Respiratory: Negative.   Gastrointestinal: Negative.   Genitourinary: Negative.   Musculoskeletal: Negative.   Skin: Negative.        Objective:   Physical Exam Vitals signs and nursing note reviewed.  Constitutional:      Appearance: Normal appearance.  HENT:     Right Ear: Ear canal normal.     Mouth/Throat:     Mouth: Mucous membranes are moist.  Cardiovascular:     Rate and Rhythm: Normal rate.  Pulmonary:     Effort: Pulmonary effort is normal.  Abdominal:     General: Abdomen is flat.  Neurological:     Mental Status: She is alert.        Assessment & Plan:  Acquired absence of right breast  S/P mastectomy, right  We placed injectable saline in the Expander using a sterile technique: Right: 70 cc for a total of 290 / 535 cc

## 2018-09-07 NOTE — Progress Notes (Signed)
Radiation Oncology         [phone] ________________________________  Name: Jacqueline Good MRN: [id number]  Date: 09/07/2018  DOB: 11-11-1960  Follow-Up Visit Note  Outpatient  CC: [name], MD  [name], MD  Diagnosis:      ICD-10-CM   1. Malignant neoplasm of upper-outer quadrant of right breast in female, estrogen receptor negative ([employer]) C50.411    Z17.1      Cancer Staging Malignant neoplasm of upper-outer quadrant of right breast in female, estrogen receptor negative ([place]) Staging form: Breast, AJCC 8th Edition - Clinical stage from 03/03/2018: Stage IIA (cT79m, cN1, cM0, G2, ER-, PR-, HER2+) - Unsigned ypT0N0  CHIEF COMPLAINT: Here to discuss management of right breast cancer  Narrative:  The patient returns today for follow-up to discuss radiation treatment options.     Since consultation date, she underwent bilateral breast MRI on 03/11/2018 revealing: additional non mass enhancement extending anterior and posterior to known lateral mass in right breast; additional enlarged right axillary and intramammary lymph nodes. She also underwent chest CT scan on 03/16/2018 showing no evidence of thoracic metastatic disease, as well as an indeterminate left adrenal nodule. Bone scan performed the same day showed no evidence of osseous metastatic disease.  She also underwent further right breast biopsies on 03/31/2018 revealing: ductal carcinoma in situ with necrosis in posterior, ductal carcinoma in situ with foci suspicious for microinvasion in anterior.  She completed neoadjuvant chemotherapy, consisting of carboplatin, gemcitabine, trastuzumab, and pertuzumab under Dr. MJana Hakimon 09/01/2018.  Most recent bilateral breast MRI on 07/09/2018 revealed: significant improvement in lateral right breast; a 4 mm nodule persists in UOQ of right breast; interval resolution of right axillary lymphadenopathy.  She opted to proceed with right modified radical mastectomy  (with lymph nodes and axillary contents) on date of 08/12/2018 with pathology report revealing: no residual carcinoma. This was immediately followed by breast reconstruction with tissue expanders.  She last saw Dr. MJana Hakimon 09/01/2017 and will continue on trastuzumab. She last saw Dr. DMarla Roeon 09/03/2018 for drain charge and expander injection.  Symptomatically, the patient reports: trouble with right arm mobility postoperatively. She reports that she quit smoking and alcohol use since diagnosis.         ALLERGIES:  is allergic to gadolinium derivatives; hydrocodone; and iodinated diagnostic agents.  Meds: Current Outpatient Medications  Medication Sig Dispense Refill  . albuterol (VENTOLIN HFA) 108 (90 Base) MCG/ACT inhaler INHALE 2 PUFFS INTO THE LUNGS EVERY 6 HOURS AS NEEDED FOR WHEEZING (Patient taking differently: Inhale 2 puffs into the lungs every 6 (six) hours as needed for wheezing or shortness of breath. ) 54 g 3  . ALPRAZolam (XANAX) 1 MG tablet 1 po q 6 hr prn.  No more than 3.5 qd. 105 tablet 3  . busPIRone (BUSPAR) 30 MG tablet Take 1 tablet (30 mg total) by mouth 2 (two) times daily. 60 tablet 3  . dexamethasone (DECADRON) 4 MG tablet Take 2 tablets (8 mg total) by mouth 2 (two) times daily. Start the day before Taxotere. Take once the day after, then 2 times a day x 2d. 30 tablet 1  . diphenoxylate-atropine (LOMOTIL) 2.5-0.025 MG tablet Take 1 tablet by mouth 4 (four) times daily as needed for diarrhea or loose stools. 30 tablet 0  . fluticasone (FLONASE) 50 MCG/ACT nasal spray Place 2 sprays into both nostrils daily. 1 g 0  . Fluticasone-Salmeterol (ADVAIR DISKUS) 250-50 MCG/DOSE AEPB INHALE 1 PUFF INTO THE LUNGS 2  TIMES DAILY. (Patient taking differently: Inhale 1 puff into the lungs 2 (two) times daily as needed (wheezing or shortness of breath). ) 60 each 2  . lidocaine-prilocaine (EMLA) cream Apply to affected area once 30 g 3  . loperamide (IMODIUM) 2 MG capsule Take 2  mg by mouth as needed for diarrhea or loose stools.    Marland Kitchen loratadine (CLARITIN) 10 MG tablet Take 10 mg by mouth daily as needed for allergies.    Marland Kitchen omeprazole (PRILOSEC) 40 MG capsule Take 1 capsule (40 mg total) by mouth daily. 30 capsule 2  . propranolol (INDERAL) 20 MG tablet Take 1 tablet (20 mg total) by mouth 3 (three) times daily as needed (palpitations). 90 tablet 0  . venlafaxine (EFFEXOR) 75 MG tablet Take 3 tablets (225 mg total) by mouth daily. Take 2 qd until 08/07/18 and then start 3 po qd. 90 tablet 3  . zolpidem (AMBIEN) 10 MG tablet Take 1 tablet (10 mg total) by mouth at bedtime as needed for sleep (1/2-1 tab). 30 tablet 3   No current facility-administered medications for this encounter.     Physical Findings:  height is 5' 4.5" (1.638 m) and weight is 196 lb 6.4 oz (89.1 kg). Her oral temperature is 98.4 F (36.9 C). Her blood pressure is 112/79 and her pulse is 64. Her respiration is 20 and oxygen saturation is 100%. .     General: Alert and oriented, she is tearful. HEENT: Head is normocephalic. Extraocular movements are intact. Oropharynx is clear. Neck: Neck is supple, no palpable cervical or supraclavicular lymphadenopathy. Heart: Regular in rate and rhythm with no murmurs, rubs, or gallops. Chest: Clear to auscultation bilaterally, with no rhonchi, wheezes, or rales. Lymphatics: see Neck Exam Musculoskeletal: symmetric strength and muscle tone throughout. She has limited range of motion in the right shoulder. Neurologic: No obvious focalities. Speech is fluent.   Psychiatric: Judgment and insight are intact. Affect is appropriate. Breast exam reveals healing from right mastectomy with incomplete tissue expansion of the chest wall.  Lab Findings: Lab Results  Component Value Date   WBC 8.6 09/01/2018   HGB 9.8 (L) 09/01/2018   HCT 29.5 (L) 09/01/2018   MCV 91.9 09/01/2018   PLT 173 09/01/2018    _0 @  Radiographic Findings: No results  found.  Impression/Plan: Right Breast Cancer s/p chemotherapy and right mastectomy  We discussed the potential benefit of adjuvant radiotherapy directed at the right chest wall and regional nodes in order to minimize the risk of locoregional recurrence despite her complete pathological response to chemotherapy. A locoregional recurrence could be devastating, and her disease was biologically aggressive. She declines physical therapy referral, but I did teach her some exercises to improve her right shoulder range of motion. I will await her referral back to me once Dr. Marla Roe has completed her tissue expansion.  I recommend approximately 6.5 weeks of daily radiation treatments. The risks, benefits and side effects of this treatment were discussed in detail.  She understands that radiotherapy is associated with skin irritation, fatigue, nonhealing soft tissue wound, and lymphedema in the arm in the acute setting. Late effects can include complications from reconstruction and rare injury to internal organs.  She is enthusiastic about proceeding with treatment. A consent form has been signed and placed in her chart.  I spent 40 minutes face to face with the patient and more than 50% of that time was spent in counseling and/or coordination of care. _____________________________________   Eppie Gibson, MD  This document serves as a record of services personally performed by Eppie Gibson, MD. It was created on her behalf by Wilburn Mylar, a trained medical scribe. The creation of this record is based on the scribe's personal observations and the provider's statements to them. This document has been checked and approved by the attending provider.

## 2018-09-08 ENCOUNTER — Encounter: Payer: Self-pay | Admitting: Radiation Oncology

## 2018-09-09 ENCOUNTER — Encounter: Payer: Self-pay | Admitting: General Practice

## 2018-09-09 NOTE — Progress Notes (Signed)
[place] Psychosocial Distress Screening Clinical Social Work  Clinical Social Work was referred by distress screening protocol.  The patient scored a 8 on the Psychosocial Distress Thermometer which indicates moderate distress. Clinical Social Worker contacted patient by phone to assess for distress and other psychosocial needs. "Its been really overwhelming. Im thankful and Im bless, but it's still very depressing for me."  Experiencing pain, anxiety, shortness of breath currently; "no desire to do things I used to do."  Reports depressive symptoms, including anhedonia, fatigue, tearfulness, depressed mood.  Denies suicidal ideation.  Struggling w adjusting body image changes after mastectomy and loss of breast and hair.  Has been treated in the past by NP at [facility], currently taking medications as prescribed by this provider.  Cannot continue to see her due to practice not accepting Medicaid which is patient's current coverage.  Does not want referral to community provider, however was willing to meet w undersigned for support/resources, appt scheduled for 2/5 at 2 PM.   ONCBCN DISTRESS SCREENING 09/07/2018  Screening Type Initial Screening  Distress experienced in past week (1-10) 8  Emotional problem type Depression;Nervousness/Anxiety;Adjusting to illness;Isolation/feeling alone;Feeling hopeless;Boredom;Adjusting to appearance changes  Spiritual/Religous concerns type   Physical Problem type Pain;Sleep/insomnia;Loss of appetitie;Constipation/diarrhea;Tingling hands/feet  Physician notified of physical symptoms     Clinical Social Worker follow up needed: Yes.  Meet w patient and provide resources/referrals.    If yes, follow up plan:  Beverely Pace, [place], LCSW Clinical Social Worker Phone:  [phone]

## 2018-09-15 ENCOUNTER — Inpatient Hospital Stay: Payer: Medicaid Other | Attending: Oncology | Admitting: General Practice

## 2018-09-15 DIAGNOSIS — Z5112 Encounter for antineoplastic immunotherapy: Secondary | ICD-10-CM | POA: Insufficient documentation

## 2018-09-15 DIAGNOSIS — C50411 Malignant neoplasm of upper-outer quadrant of right female breast: Secondary | ICD-10-CM

## 2018-09-15 DIAGNOSIS — Z171 Estrogen receptor negative status [ER-]: Secondary | ICD-10-CM

## 2018-09-15 DIAGNOSIS — C773 Secondary and unspecified malignant neoplasm of axilla and upper limb lymph nodes: Secondary | ICD-10-CM | POA: Insufficient documentation
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
## 2018-09-17 ENCOUNTER — Encounter: Payer: Self-pay | Admitting: Plastic Surgery

## 2018-09-17 ENCOUNTER — Ambulatory Visit (INDEPENDENT_AMBULATORY_CARE_PROVIDER_SITE_OTHER): Payer: Medicaid Other | Admitting: Plastic Surgery

## 2018-09-17 ENCOUNTER — Telehealth: Payer: Self-pay

## 2018-09-17 VITALS — BP 113/76 | HR 71 | Temp 98.4°F | Ht 64.0 in | Wt 196.0 lb

## 2018-09-17 DIAGNOSIS — Z9011 Acquired absence of right breast and nipple: Secondary | ICD-10-CM

## 2018-09-17 MED ORDER — KETOROLAC TROMETHAMINE 10 MG PO TABS: 10.0000 mg | ORAL_TABLET | Freq: Three times a day (TID) | ORAL | 0 refills | Status: AC | PRN

## 2018-09-17 MED ORDER — SULFAMETHOXAZOLE-TRIMETHOPRIM 800-160 MG PO TABS: 1.0000 | ORAL_TABLET | Freq: Two times a day (BID) | ORAL | 0 refills | Status: AC

## 2018-09-17 MED FILL — SULFAMETHOXAZOLE-TMP DS TAB: 800-160 | 10 days supply | Qty: 20 | Fill #0

## 2018-09-17 MED FILL — KETOROLAC 10 MG TABLET: 10 | 5 days supply | Qty: 15 | Fill #0

## 2018-09-17 MED FILL — OMEPRAZOLE 40 MG CPDR: 40 | 30 days supply | Qty: 30 | Fill #2

## 2018-09-17 NOTE — Progress Notes (Signed)
   Subjective:    Patient ID: Jacqueline Good, female    DOB: 07/22/1961, 58 y.o.   MRN: [id number]  The patient is a 58 yrs old female here for follow up on her right breast reconstruction.  She called with concerns about redness and drainage.  The drainage is serosanginous.  There is slight redness.  She is afebrile.  No other symptoms.    Review of Systems  Constitutional: Negative.  Negative for activity change and appetite change.  Eyes: Negative.   Respiratory: Negative.   Gastrointestinal: Negative.   Musculoskeletal: Negative.   Skin: Positive for wound.       Objective:   Physical Exam Vitals signs and nursing note reviewed.  Constitutional:      Appearance: Normal appearance.  HENT:     Head: Normocephalic.  Cardiovascular:     Rate and Rhythm: Normal rate.  Neurological:     Mental Status: She is alert.  Psychiatric:        Mood and Affect: Mood normal.        Thought Content: Thought content normal.        Judgment: Judgment normal.       Assessment & Plan:  Acquired absence of right breast  S/P mastectomy, right  Antibiotic sent into Pharmacy.  Would like to see her back next week.  May need to revise incision site.

## 2018-09-17 NOTE — Telephone Encounter (Signed)
Call from pt- to report that she is having an increase in drainage from right breast- medial aspect of incision She describes the drainage as "yellow/pus" Denies any fever, c/o increased redness & pain remains a 6 on pain scale  Per Dr. Marla Roe- pt will come in today for assessment

## 2018-09-20 ENCOUNTER — Telehealth: Payer: Self-pay

## 2018-09-21 ENCOUNTER — Encounter: Payer: Self-pay | Admitting: Plastic Surgery

## 2018-09-21 ENCOUNTER — Ambulatory Visit (INDEPENDENT_AMBULATORY_CARE_PROVIDER_SITE_OTHER): Payer: Medicaid Other | Admitting: Plastic Surgery

## 2018-09-21 VITALS — BP 120/71 | HR 64 | Temp 98.9°F | Ht 64.0 in | Wt 196.0 lb

## 2018-09-21 DIAGNOSIS — Z9011 Acquired absence of right breast and nipple: Secondary | ICD-10-CM

## 2018-09-21 DIAGNOSIS — Z171 Estrogen receptor negative status [ER-]: Secondary | ICD-10-CM

## 2018-09-21 DIAGNOSIS — C50411 Malignant neoplasm of upper-outer quadrant of right female breast: Secondary | ICD-10-CM

## 2018-09-21 NOTE — Progress Notes (Signed)
   Subjective:    Patient ID: Jacqueline Good, female    DOB: Jul 31, 1961, 58 y.o.   MRN: [id number]  The patient is a 58 year old female here for follow-up on her right breast reconstruction.  She had a little bit of a wound on the medial most aspect of the right incision.  It is looking much better and she has a lot less drainage.  There is no redness. She is finishing up her antibiotic.  I do not think it is ready for expansion today.  I am aware that Dr. Isidore Moos is wanting to do radiation.   Review of Systems  Constitutional: Negative.   HENT: Negative.   Eyes: Negative.   Respiratory: Negative.   Gastrointestinal: Negative.   Genitourinary: Negative.   Musculoskeletal: Negative.   Skin: Positive for wound.       Objective:   Physical Exam Vitals signs and nursing note reviewed.  Constitutional:      Appearance: Normal appearance.  HENT:     Head: Normocephalic and atraumatic.  Cardiovascular:     Rate and Rhythm: Normal rate.  Pulmonary:     Effort: Pulmonary effort is normal.  Chest:    Neurological:     Mental Status: She is alert.  Psychiatric:        Mood and Affect: Mood normal.       Assessment & Plan:  Malignant neoplasm of upper-outer quadrant of right breast in female, estrogen receptor negative ([place])  Acquired absence of right breast  S/P mastectomy, right I have placed a call to Dr. Isidore Moos and we will discuss the timing for continuing with the reconstruction versus starting the radiation now.  If we start radiation now it will delay her reconstruction for almost a year.  She will likely need a latissimus muscle flap as well.  I will see her back in a week and we will try to expand.

## 2018-09-21 NOTE — Telephone Encounter (Signed)
Opened in error [facility]

## 2018-09-22 ENCOUNTER — Other Ambulatory Visit: Payer: Medicaid Other

## 2018-09-22 ENCOUNTER — Encounter: Payer: Self-pay | Admitting: Genetic Counselor

## 2018-09-22 ENCOUNTER — Inpatient Hospital Stay: Payer: Medicaid Other

## 2018-09-22 ENCOUNTER — Other Ambulatory Visit: Payer: Self-pay | Admitting: Oncology

## 2018-09-22 ENCOUNTER — Inpatient Hospital Stay (HOSPITAL_BASED_OUTPATIENT_CLINIC_OR_DEPARTMENT_OTHER): Payer: Medicaid Other | Admitting: Genetic Counselor

## 2018-09-22 VITALS — BP 129/74 | HR 57 | Temp 98.0°F | Resp 18

## 2018-09-22 DIAGNOSIS — Z803 Family history of malignant neoplasm of breast: Secondary | ICD-10-CM

## 2018-09-22 DIAGNOSIS — Z5112 Encounter for antineoplastic immunotherapy: Secondary | ICD-10-CM | POA: Diagnosis present

## 2018-09-22 DIAGNOSIS — Z8049 Family history of malignant neoplasm of other genital organs: Secondary | ICD-10-CM | POA: Diagnosis not present

## 2018-09-22 DIAGNOSIS — C50411 Malignant neoplasm of upper-outer quadrant of right female breast: Secondary | ICD-10-CM

## 2018-09-22 DIAGNOSIS — J45901 Unspecified asthma with (acute) exacerbation: Secondary | ICD-10-CM

## 2018-09-22 DIAGNOSIS — C773 Secondary and unspecified malignant neoplasm of axilla and upper limb lymph nodes: Secondary | ICD-10-CM | POA: Diagnosis not present

## 2018-09-22 DIAGNOSIS — Z95828 Presence of other vascular implants and grafts: Secondary | ICD-10-CM

## 2018-09-22 DIAGNOSIS — J441 Chronic obstructive pulmonary disease with (acute) exacerbation: Secondary | ICD-10-CM

## 2018-09-22 DIAGNOSIS — Z171 Estrogen receptor negative status [ER-]: Secondary | ICD-10-CM

## 2018-09-22 DIAGNOSIS — Z8042 Family history of malignant neoplasm of prostate: Secondary | ICD-10-CM | POA: Insufficient documentation

## 2018-09-22 LAB — CBC WITH DIFFERENTIAL/PLATELET
Abs Immature Granulocytes: 0.03 10*3/uL (ref 0.00–0.07)
Basophils Absolute: 0 10*3/uL (ref 0.0–0.1)
Basophils Relative: 0 %
Eosinophils Absolute: 0.1 10*3/uL (ref 0.0–0.5)
Eosinophils Relative: 2 %
HCT: 30 % — ABNORMAL LOW (ref 36.0–46.0)
Hemoglobin: 9.7 g/dL — ABNORMAL LOW (ref 12.0–15.0)
Immature Granulocytes: 1 %
Lymphocytes Relative: 21 %
Lymphs Abs: 1.3 10*3/uL (ref 0.7–4.0)
MCH: 29.3 pg (ref 26.0–34.0)
MCHC: 32.3 g/dL (ref 30.0–36.0)
MCV: 90.6 fL (ref 80.0–100.0)
Monocytes Absolute: 0.4 10*3/uL (ref 0.1–1.0)
Monocytes Relative: 6 %
Neutro Abs: 4.1 10*3/uL (ref 1.7–7.7)
Neutrophils Relative %: 70 %
PLATELETS: 222 10*3/uL (ref 150–400)
RBC: 3.31 MIL/uL — ABNORMAL LOW (ref 3.87–5.11)
RDW: 15.3 % (ref 11.5–15.5)
WBC: 5.9 10*3/uL (ref 4.0–10.5)
nRBC: 0 % (ref 0.0–0.2)

## 2018-09-22 LAB — COMPREHENSIVE METABOLIC PANEL
ALT: 15 U/L (ref 0–44)
AST: 16 U/L (ref 15–41)
Albumin: 3.7 g/dL (ref 3.5–5.0)
Alkaline Phosphatase: 99 U/L (ref 38–126)
Anion gap: 11 (ref 5–15)
BILIRUBIN TOTAL: 0.4 mg/dL (ref 0.3–1.2)
BUN: 25 mg/dL — ABNORMAL HIGH (ref 6–20)
CO2: 22 mmol/L (ref 22–32)
Calcium: 9.6 mg/dL (ref 8.9–10.3)
Chloride: 107 mmol/L (ref 98–111)
Creatinine, Ser: 1.31 mg/dL — ABNORMAL HIGH (ref 0.44–1.00)
GFR calc Af Amer: 52 mL/min — ABNORMAL LOW (ref >60)
GFR calc non Af Amer: 45 mL/min — ABNORMAL LOW (ref >60)
GLUCOSE: 86 mg/dL (ref 70–99)
Potassium: 4.7 mmol/L (ref 3.5–5.1)
Sodium: 140 mmol/L (ref 135–145)
TOTAL PROTEIN: 7.6 g/dL (ref 6.5–8.1)

## 2018-09-22 MED ORDER — TRASTUZUMAB CHEMO 150 MG IV SOLR
6.0000 mg/kg | Freq: Once | INTRAVENOUS | Status: AC
  Administered 2018-09-22: 588 mg via INTRAVENOUS
  Filled 2018-09-22: qty 28

## 2018-09-22 MED ORDER — SODIUM CHLORIDE 0.9% FLUSH
10.0000 mL | Freq: Once | INTRAVENOUS | Status: AC
  Administered 2018-09-22: 10 mL
  Filled 2018-09-22: qty 10

## 2018-09-22 MED ORDER — DIPHENHYDRAMINE HCL 25 MG PO CAPS
25.0000 mg | ORAL_CAPSULE | Freq: Once | ORAL | Status: AC
  Administered 2018-09-22: 25 mg via ORAL

## 2018-09-22 MED ORDER — ACETAMINOPHEN 325 MG PO TABS
650.0000 mg | ORAL_TABLET | Freq: Once | ORAL | Status: AC
  Administered 2018-09-22: 650 mg via ORAL

## 2018-09-22 MED ORDER — HEPARIN SOD (PORK) LOCK FLUSH 100 UNIT/ML IV SOLN
500.0000 [IU] | Freq: Once | INTRAVENOUS | Status: AC | PRN
  Administered 2018-09-22: 500 [IU]
  Filled 2018-09-22: qty 5

## 2018-09-22 MED ORDER — ACETAMINOPHEN 325 MG PO TABS
ORAL_TABLET | ORAL | Status: AC
  Filled 2018-09-22: qty 2

## 2018-09-22 MED ORDER — SODIUM CHLORIDE 0.9% FLUSH
10.0000 mL | INTRAVENOUS | Status: DC | PRN
  Administered 2018-09-22: 10 mL
  Filled 2018-09-22: qty 10

## 2018-09-22 MED ORDER — SODIUM CHLORIDE 0.9 % IV SOLN
Freq: Once | INTRAVENOUS | Status: AC
  Administered 2018-09-22: 11:00:00 via INTRAVENOUS
  Filled 2018-09-22: qty 250

## 2018-09-22 MED ORDER — DIPHENHYDRAMINE HCL 25 MG PO CAPS
ORAL_CAPSULE | ORAL | Status: AC
  Filled 2018-09-22: qty 1

## 2018-09-22 MED FILL — LIDOCAINE-PRILOCAINE CREAM: 2.5-2.5 | 30 days supply | Qty: 30 | Fill #0

## 2018-09-22 MED FILL — busPIRone HCL 30 MG TABS: 30 | 30 days supply | Qty: 60 | Fill #0

## 2018-09-22 NOTE — Progress Notes (Unsigned)
[facility]  [phone] [phone]   ID: Jacqueline Good DOB: 1961/06/07  MR#: [id number]  EJY#:[id number]  Patient Care Team: [name], MD as PCP - General (Internal Medicine) Alphonsa Overall, MD as Consulting Physician (General Surgery) [name], [name], MD as Consulting Physician (Oncology) Eppie Gibson, MD as Attending Physician (Radiation Oncology) Dillingham, Loel Lofty, DO as Attending Physician (Plastic Surgery) OTHER MD: Donnal Moat, PA-C [FAX [phone]]   CHIEF COMPLAINT: Estrogen receptor negative breast cancer  CURRENT TREATMENT: Trastuzumab   INTERVAL HISTORY: Jacqueline Good returns today for follow-up and treatment of estrogen receptor negative breast cancer. She is accompanied by a friend or family member.  She completed neoadjuvant chemotherapy, consisting of carboplatin, gemcitabine, trastuzumab, and pertuzumab.   Jacqueline Good's last echocardiogram on 06/25/2018, showed an ejection fraction in the 55% - 60% range.   Her last breast MRI was on 07/09/2018.  Since her last visit here, she underwent a right modified radical mastectomy on 08/12/2018. The pathology from this procedure showed ([id number]):   1. Breast, radical mastectomy (including lymph nodes), right with axillary contents    - Fibrosis with inflammation and hemorrhage consistent with previous biopsy.   - No residual carcinoma identified   - Fibrocystic changes with sclerosing adenosis and calcifications   - Margins free of tumor   - Ten benign lymph nodes, one with focal fibrosis  2. Lymph node, biopsy, right highest axillary   - One benign lymph node   REVIEW OF SYSTEMS: Jacqueline Good states that she has been in pain post surgery. She has been feeling fatigue, she has had an appetite decrease, and she has had chest pain. She said at first, it felt like there was an organ moving around in her chest, and that it hurts when she bends over. She is on hydrocodone for pain,  but it is making her constipated; she is taking MiraLax to treat, with very little relief. She states that she is very depressed about the way she looks. She is currently draining about a full bulb a day and is worried about the tube coming out since she felt wetness at the insertion site. She has an appointment with Dr. Marla Roe on 09/03/2018. For activity, she has not done much recently, especially since that fatigue set in. The patient denies unusual headaches, visual changes, nausea, vomiting, or dizziness. There has been no unusual cough, phlegm production, or pleurisy. This been no change in bladder habits. The patient denies unexplained weight loss, rash, or fever. A detailed review of systems was otherwise noncontributory.     HISTORY OF CURRENT ILLNESS: From the original intake note:  Jacqueline Good had routine screening mammography on 02/05/2018 showing a possible abnormality in the right breast. She underwent unilateral right diagnostic mammography with tomography and right breast ultrasonography at [facility] on 02/15/2018 showing: Highly suspicious right breast mass at 10 o'clock position.  There was a second, 0.6 cm lesion at the 10:00 radiant which has not been biopsied.  Suspicious focal cortical thickening of a single right axillary lymph node.  Accordingly on 02/25/2018 she proceeded to biopsy of the right breast area in question. The pathology from this procedure showed ([id number]): Breast, right, needle core biopsy, OUQ with microscopic focus of invasive ductal carcinoma, grade 2, arising in a background of high grade ductal carcinoma in situ. 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   The patient's subsequent history is as detailed below.  PAST MEDICAL HISTORY: Past Medical History:  Diagnosis Date  . Anemia   . Anxiety   . Arthritis    "all over" (08/12/2018)  . Asthma   . Breast cancer, right breast  ([facility]) dx'd 02/2018  . Chronic bronchitis ([facility])   . Depression   . GERD (gastroesophageal reflux disease)   . History of blood transfusion    "several; related to low blood" (08/12/2018)    PAST SURGICAL HISTORY: Past Surgical History:  Procedure Laterality Date  . AXILLARY LYMPH NODE DISSECTION Right 08/12/2018   Procedure: AXILLARY LYMPH NODE DISSECTION;  Surgeon: Alphonsa Overall, MD;  Location: [facility];  Service: General;  Laterality: Right;  . BREAST BIOPSY Right 03/2018  . BREAST RECONSTRUCTION WITH PLACEMENT OF TISSUE EXPANDER AND FLEX HD (ACELLULAR HYDRATED DERMIS) Right 08/12/2018   Procedure: RIGHT BREAST RECONSTRUCTION WITH PLACEMENT OF TISSUE EXPANDER AND FLEX HD (ACELLULAR HYDRATED DERMIS);  Surgeon: Wallace Going, DO;  Location: [facility];  Service: Plastics;  Laterality: Right;  . DILATION AND CURETTAGE OF UTERUS    . ENDOMETRIAL ABLATION    . IR IMAGING GUIDED PORT INSERTION  03/18/2018  . MASTECTOMY MODIFIED RADICAL Right 08/12/2018   w/axillary LND  . MASTECTOMY MODIFIED RADICAL Right 08/12/2018   Procedure: RIGHT MODIFIED RADICAL MASTECTOMY;  Surgeon: Alphonsa Overall, MD;  Location: [facility];  Service: General;  Laterality: Right;  . MYOMECTOMY      FAMILY HISTORY Family History  Problem Relation Age of Onset  . Lupus Sister   . Heart disease Sister   . Breast cancer Mother    She notes that her father is currently 86 years old. Patients' mother is currently 59 years old and she was diagnosed with breast cancer at age 42. The patient has 4 brothers and 1 sister. She has a maternal aunt with breast cancer and her maternal great grandmother with had ovarian cancer.    GYNECOLOGIC HISTORY:  No LMP recorded. Patient is postmenopausal. Menarche: 58 years old Age at first live birth: 58 years old GX P: 1 LMP: 13-14 years ago s/p ablation Contraceptive: no HRT: no  Hysterectomy: no SO: no   SOCIAL HISTORY: She is currently unemployed.  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 She lives alone without pets. Her daughter is Jacqueline Good who lives in [place] and works in Therapist, art.  The patient has one grandchild. She goes to a [employer].   ADVANCED DIRECTIVES: Not in place.  At the 03/03/2018 visit the patient was given the appropriate documents to complete and notarized at her discretion   HEALTH MAINTENANCE: Social History   Tobacco Use  . Smoking status: Former Smoker    Packs/day: 1.00    Years: 38.00    Pack years: 38.00    Types: Cigarettes    Last attempt to quit: 05/15/2018    Years since quitting: 0.3  . Smokeless tobacco: Never Used  Substance Use Topics  . Alcohol use: Not Currently    Comment: 08/12/2018 "nothing since 04/2018"  . Drug use: No     Colonoscopy: Never  PAP:   Bone density: Never   Allergies  Allergen Reactions  . Gadolinium Derivatives Itching and Cough    Pt began sneezing and coughing as soon as MRI contrast was injected. Severe nasal congestion. No rash or hives no SOB.   Marland Kitchen Hydrocodone Hives and Itching    Pt says she can take it with benadryl.   . Iodinated Diagnostic Agents Itching  Current Outpatient Medications  Medication Sig Dispense Refill  . albuterol (VENTOLIN HFA) 108 (90 Base) MCG/ACT inhaler INHALE 2 PUFFS INTO THE LUNGS EVERY 6 HOURS AS NEEDED FOR WHEEZING (Patient taking differently: Inhale 2 puffs into the lungs every 6 (six) hours as needed for wheezing or shortness of breath. ) 54 g 3  . ALPRAZolam (XANAX) 1 MG tablet 1 po q 6 hr prn.  No more than 3.5 qd. 105 tablet 3  . busPIRone (BUSPAR) 30 MG tablet Take 1 tablet (30 mg total) by mouth 2 (two) times daily. 60 tablet 3  . dexamethasone (DECADRON) 4 MG tablet Take 2 tablets (8 mg total) by mouth 2 (two) times daily. Start the day before Taxotere. Take once the day after, then 2 times a day x 2d. 30 tablet 1  . diphenoxylate-atropine (LOMOTIL) 2.5-0.025 MG tablet Take 1 tablet by mouth 4 (four) times daily as  needed for diarrhea or loose stools. 30 tablet 0  . fluticasone (FLONASE) 50 MCG/ACT nasal spray Place 2 sprays into both nostrils daily. 1 g 0  . Fluticasone-Salmeterol (ADVAIR DISKUS) 250-50 MCG/DOSE AEPB INHALE 1 PUFF INTO THE LUNGS 2 TIMES DAILY. (Patient taking differently: Inhale 1 puff into the lungs 2 (two) times daily as needed (wheezing or shortness of breath). ) 60 each 2  . ketorolac (TORADOL) 10 MG tablet Take 1 tablet (10 mg total) by mouth every 8 (eight) hours as needed for up to 5 days for severe pain. 15 tablet 0  . lidocaine-prilocaine (EMLA) cream Apply to affected area once 30 g 3  . loperamide (IMODIUM) 2 MG capsule Take 2 mg by mouth as needed for diarrhea or loose stools.    Marland Kitchen loratadine (CLARITIN) 10 MG tablet Take 10 mg by mouth daily as needed for allergies.    Marland Kitchen omeprazole (PRILOSEC) 40 MG capsule Take 1 capsule (40 mg total) by mouth daily. 30 capsule 2  . propranolol (INDERAL) 20 MG tablet Take 1 tablet (20 mg total) by mouth 3 (three) times daily as needed (palpitations). 90 tablet 0  . propranolol (INDERAL) 40 MG tablet Take 1 tablet by mouth daily.    Marland Kitchen sulfamethoxazole-trimethoprim (BACTRIM DS) 800-160 MG tablet Take 1 tablet by mouth 2 (two) times daily for 10 days. 20 tablet 0  . venlafaxine (EFFEXOR) 75 MG tablet Take 3 tablets (225 mg total) by mouth daily. Take 2 qd until 08/07/18 and then start 3 po qd. 90 tablet 3  . zolpidem (AMBIEN) 10 MG tablet Take 1 tablet (10 mg total) by mouth at bedtime as needed for sleep (1/2-1 tab). 30 tablet 3   No current facility-administered medications for this visit.     OBJECTIVE: Middle-aged African-American woman who appears stated age  There were no vitals filed for this visit.   There is no height or weight on file to calculate BMI.   Wt Readings from Last 3 Encounters:  09/21/18 196 lb (88.9 kg)  09/17/18 196 lb (88.9 kg)  09/07/18 196 lb 6.4 oz (89.1 kg)  ECOG FS:1 - Symptomatic but completely  ambulatory  Sclerae unicteric, EOMs intact No cervical or supraclavicular adenopathy Lungs no rales or rhonchi Heart regular rate and rhythm Abd soft, nontender, positive bowel sounds MSK no focal spinal tenderness, no upper extremity lymphedema Neuro: nonfocal, well oriented, appropriate affect Breasts: The right breast is status post mastectomy.  Drains are still in place.  I do not see significant erythema or swelling at the exit site.  The incision is  healing nicely, with expander in place.  The left breast is benign.  Both axillae are benign.     LAB RESULTS:  CMP     Component Value Date/Time   NA 140 08/06/2018 1109   K 3.8 08/06/2018 1109   CL 109 08/06/2018 1109   CO2 23 08/06/2018 1109   GLUCOSE 87 08/06/2018 1109   BUN 19 08/06/2018 1109   CREATININE 0.89 08/06/2018 1109   CREATININE 0.80 03/03/2018 0830   CREATININE 0.83 09/24/2016 1151   CALCIUM 9.3 08/06/2018 1109   PROT 7.3 08/06/2018 1109   ALBUMIN 4.1 08/06/2018 1109   AST 23 08/06/2018 1109   AST 15 03/03/2018 0830   ALT 36 08/06/2018 1109   ALT 16 03/03/2018 0830   ALKPHOS 117 08/06/2018 1109   BILITOT 0.7 08/06/2018 1109   BILITOT 0.5 03/03/2018 0830   GFRNONAA >60 08/06/2018 1109   GFRNONAA >60 03/03/2018 0830   GFRNONAA 79 09/24/2016 1151   GFRAA >60 08/06/2018 1109   GFRAA >60 03/03/2018 0830   GFRAA >89 09/24/2016 1151    No results found for: TOTALPROTELP, ALBUMINELP, A1GS, A2GS, BETS, BETA2SER, GAMS, MSPIKE, SPEI  No results found for: KPAFRELGTCHN, LAMBDASER, KAPLAMBRATIO  Lab Results  Component Value Date   WBC 8.6 09/01/2018   NEUTROABS 6.5 09/01/2018   HGB 9.8 (L) 09/01/2018   HCT 29.5 (L) 09/01/2018   MCV 91.9 09/01/2018   PLT 173 09/01/2018    '@LASTCHEMISTRY'$ @  No results found for: LABCA2  No components found for: EQASTM196  No results for input(s): INR in the last 168 hours.  No results found for: LABCA2  No results found for: QIW979  No results found for:  GXQ119  No results found for: ERD408  No results found for: CA2729  No components found for: HGQUANT  No results found for: CEA1 / No results found for: CEA1   No results found for: AFPTUMOR  No results found for: CHROMOGRNA  No results found for: PSA1  No visits with results within 3 Day(s) from this visit.  Latest known visit with results is:  Appointment on 09/01/2018  Component Date Value Ref Range Status  . WBC Count 09/01/2018 8.6  4.0 - 10.5 K/uL Final  . RBC 09/01/2018 3.21* 3.87 - 5.11 MIL/uL Final  . Hemoglobin 09/01/2018 9.8* 12.0 - 15.0 g/dL Final  . HCT 09/01/2018 29.5* 36.0 - 46.0 % Final  . MCV 09/01/2018 91.9  80.0 - 100.0 fL Final  . MCH 09/01/2018 30.5  26.0 - 34.0 pg Final  . MCHC 09/01/2018 33.2  30.0 - 36.0 g/dL Final  . RDW 09/01/2018 15.6* 11.5 - 15.5 % Final  . Platelet Count 09/01/2018 173  150 - 400 K/uL Final  . nRBC 09/01/2018 0.0  0.0 - 0.2 % Final  . Neutrophils Relative % 09/01/2018 76  % Final  . Neutro Abs 09/01/2018 6.5  1.7 - 7.7 K/uL Final  . Lymphocytes Relative 09/01/2018 16  % Final  . Lymphs Abs 09/01/2018 1.4  0.7 - 4.0 K/uL Final  . Monocytes Relative 09/01/2018 6  % Final  . Monocytes Absolute 09/01/2018 0.5  0.1 - 1.0 K/uL Final  . Eosinophils Relative 09/01/2018 2  % Final  . Eosinophils Absolute 09/01/2018 0.1  0.0 - 0.5 K/uL Final  . Basophils Relative 09/01/2018 0  % Final  . Basophils Absolute 09/01/2018 0.0  0.0 - 0.1 K/uL Final  . Immature Granulocytes 09/01/2018 0  % Final  . Abs Immature Granulocytes 09/01/2018 0.03  0.00 - 0.07 K/uL Final   Performed at [facility], [address] [place],  [address]    (this displays the last labs from the last 3 days)  No results found for: TOTALPROTELP, ALBUMINELP, A1GS, A2GS, BETS, BETA2SER, GAMS, MSPIKE, SPEI (this displays SPEP labs)  No results found for: KPAFRELGTCHN, LAMBDASER, KAPLAMBRATIO (kappa/lambda light chains)  No results found  for: HGBA, HGBA2QUANT, HGBFQUANT, HGBSQUAN (Hemoglobinopathy evaluation)   No results found for: LDH  No results found for: IRON, TIBC, IRONPCTSAT (Iron and TIBC)  No results found for: FERRITIN  Urinalysis    Component Value Date/Time   COLORURINE CANCELED 09/24/2016 1151   APPEARANCEUR CANCELED 09/24/2016 1151   LABSPEC CANCELED 09/24/2016 1151   PHURINE CANCELED 09/24/2016 1151   GLUCOSEU CANCELED 09/24/2016 1151   HGBUR CANCELED 09/24/2016 1151   BILIRUBINUR CANCELED 09/24/2016 1151   KETONESUR CANCELED 09/24/2016 1151   PROTEINUR CANCELED 09/24/2016 1151   NITRITE CANCELED 09/24/2016 1151   LEUKOCYTESUR CANCELED 09/24/2016 1151     STUDIES:  No results found.   ELIGIBLE FOR AVAILABLE RESEARCH PROTOCOL:BCEP   ASSESSMENT: 58 y.o. Jacqueline Good woman status post right breast upper outer quadrant biopsy 02/25/2018 for a clinical T2 pN1, stage IIB invasive ductal carcinoma, estrogen and progesterone receptor negative, HER-2 amplified, with an MIB-1 of 50%.  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   (1) genetics testing pending  (2) neoadjuvant chemotherapy will consist of carboplatin, docetaxel, trastuzumab and Pertuzumab every 21 days x 6 starting 03/23/2018  (a) pertuzumab omitted after cycle 1 due to poorly-controlled diarrhea  (b) docetaxel discontinued after cycle 3 due to neuropathy; gemcitabine substituted  (c) Granix substituted for Neulasta on days 3-6 following chemotherapy due to improved tolerance  (d) Switched back to Neulasta after cycles 5 and 6 per patient request  (3) anti-HER-2 treatment will continue for a minimum of 6 months  (a) baseline echocardiogram 03/16/2018 shows an ejection fraction in the 55-60% range.  (b) echo on 11/15 shows EF of 55-60%  (4) status post right modified radical mastectomy 08/12/2018 showing a complete pathologic response (ypT0 ypN0)  (a) a total of 11 regional lymph nodes  removed  (b) expander in place  (5) adjuvant radiation pending  (6) left adrenal adenoma measuring 2.9 cm on CT of the chest 03/16/2018   PLAN:  Angelynn had the best possible response to her chemotherapy and she understands this predicts a good long-term prognosis.  I was delighted for her.  She is having a great deal of pain and distress related to her surgery.  There has been some leakage from the drain and of course she is still draining a fair amount.  Her pain is moderately well controlled and she is constipated secondary to the pain medication.  We discussed some of these issues today.  She is scheduled to see her general surgeon Dr. Lucia Gaskins tomorrow and her plastic surgeon Dr. Marla Roe on Friday.  Today we discussed the data suggesting that 6 months of trastuzumab is as good as 12.  More recent reviews of the European study has pointed out that they use different chemotherapy than we do an outpatient cannot simply assume they will get the same result as the European study suggests.  Lynette is agreeable to continuing trastuzumab for full year and I think that is the safest bet for her.  I have requested a meeting with genetics sometime when she comes in for treatment.  Otherwise she will  return to see me in 6 weeks.  She knows to call for any other issue that may develop before her next visit here.    [name], [name], MD  09/22/18 8:52 AM Medical Oncology and Hematology [facility] [address] [employer], [place] [address] Tel. [phone]    Fax. [phone]  I, Jacqualyn Posey am acting as a Education administrator for Chauncey Cruel, MD.   I, Lurline Del MD, have reviewed the above documentation for accuracy and completeness, and I agree with the above.

## 2018-09-22 NOTE — Patient Instructions (Signed)
[facility] Discharge Instructions for Patients Receiving Chemotherapy Today you received the following chemotherapy agents:  Herceptin To help prevent nausea and vomiting after your treatment, we encourage you to take your nausea medication as prescribed.   If you develop nausea and vomiting that is not controlled by your nausea medication, call the clinic.   BELOW ARE SYMPTOMS THAT SHOULD BE REPORTED IMMEDIATELY:  *FEVER GREATER THAN 100.5 F  *CHILLS WITH OR WITHOUT FEVER  NAUSEA AND VOMITING THAT IS NOT CONTROLLED WITH YOUR NAUSEA MEDICATION  *UNUSUAL SHORTNESS OF BREATH  *UNUSUAL BRUISING OR BLEEDING  TENDERNESS IN MOUTH AND THROAT WITH OR WITHOUT PRESENCE OF ULCERS  *URINARY PROBLEMS  *BOWEL PROBLEMS  UNUSUAL RASH Items with * indicate a potential emergency and should be followed up as soon as possible.  Feel free to call the clinic should you have any questions or concerns. The clinic phone number is [phone].  Please show the CHEMO ALERT CARD at check-in to the Emergency Department and triage nurse.   

## 2018-09-22 NOTE — Progress Notes (Signed)
REFERRING PROVIDER: Chauncey Cruel, MD [address] [place], [place] [address]  PRIMARY PROVIDER:  [name], MD  PRIMARY REASON FOR VISIT:  1. Malignant neoplasm of upper-outer quadrant of right breast in female, estrogen receptor negative ([place])   2. Family history of breast cancer   3. Family history of uterine cancer   4. Family history of prostate cancer      HISTORY OF PRESENT ILLNESS:   Ms. Lesesne, a 58 y.o. female, was seen for a [place] cancer genetics consultation at the request of Dr. Jana Hakim due to a personal and family history of cancer.  Ms. Coombes presents to clinic today to discuss the possibility of a hereditary predisposition to cancer, genetic testing, and to further clarify her future cancer risks, as well as potential cancer risks for family members.   In July 2019, at the age of 39, Ms. Kimbell was diagnosed with invasive ductal carcinoma of the right breast. This was treated with chemotherapy thus far.  Her mother underwent genetic testing in 2016 and was found to have two VUS in CDH1, but no deleterious mutations were identified.      CANCER HISTORY:    Malignant neoplasm of upper-outer quadrant of right breast in female, estrogen receptor negative ([place])   03/02/2018 Initial Diagnosis    Malignant neoplasm of upper-outer quadrant of right breast in female, estrogen receptor negative ([place])    03/23/2018 -  Chemotherapy    The patient had palonosetron (ALOXI) injection 0.25 mg, 0.25 mg, Intravenous,  Once, 6 of 6 cycles Administration: 0.25 mg (03/23/2018), 0.25 mg (04/13/2018), 0.25 mg (05/04/2018), 0.25 mg (06/24/2018), 0.25 mg (07/12/2018), 0.25 mg (05/31/2018) pegfilgrastim (NEULASTA) injection 6 mg, 6 mg, Subcutaneous, Once, 4 of 4 cycles Administration: 6 mg (03/25/2018), 6 mg (04/15/2018), 6 mg (05/06/2018), 6 mg (07/14/2018) trastuzumab (HERCEPTIN) 750 mg in sodium chloride 0.9 % 250 mL chemo infusion, 777 mg, Intravenous,  Once, 9 of 17  cycles Administration: 750 mg (03/23/2018), 600 mg (04/13/2018), 600 mg (05/04/2018), 600 mg (06/24/2018), 600 mg (07/12/2018), 600 mg (08/06/2018), 600 mg (05/31/2018), 588 mg (09/01/2018) CARBOplatin (PARAPLATIN) 720 mg in sodium chloride 0.9 % 250 mL chemo infusion, 720 mg (100 % of original dose 721 mg), Intravenous,  Once, 6 of 6 cycles Dose modification:   (original dose 721 mg, Cycle 1) Administration: 720 mg (03/23/2018), 650 mg (04/13/2018), 670 mg (05/04/2018), 710 mg (06/24/2018), 620 mg (07/12/2018), 710 mg (05/31/2018) DOCEtaxel (TAXOTERE) 160 mg in sodium chloride 0.9 % 250 mL chemo infusion, 75 mg/m2 = 160 mg, Intravenous,  Once, 3 of 3 cycles Administration: 160 mg (03/23/2018), 160 mg (04/13/2018), 160 mg (05/04/2018) gemcitabine (GEMZAR) 1,672 mg in sodium chloride 0.9 % 250 mL chemo infusion, 800 mg/m2 = 1,672 mg (100 % of original dose 800 mg/m2), Intravenous,  Once, 3 of 3 cycles Dose modification: 800 mg/m2 (original dose 800 mg/m2, Cycle 4, Reason: Provider Judgment) Administration: 1,672 mg (05/31/2018), 1,672 mg (06/24/2018), 1,672 mg (07/12/2018) pertuzumab (PERJETA) 840 mg in sodium chloride 0.9 % 250 mL chemo infusion, 840 mg, Intravenous, Once, 1 of 1 cycle Administration: 840 mg (03/23/2018) fosaprepitant (EMEND) 150 mg, dexamethasone (DECADRON) 12 mg in sodium chloride 0.9 % 145 mL IVPB, , Intravenous,  Once, 6 of 6 cycles Administration:  (03/23/2018),  (04/13/2018),  (05/04/2018),  (06/24/2018),  (07/12/2018),  (05/31/2018)  for chemotherapy treatment.     09/22/2018 -  Chemotherapy    The patient had trastuzumab (HERCEPTIN) 714 mg in sodium chloride 0.9 % 250 mL  chemo infusion, 8 mg/kg = 714 mg, Intravenous,  Once, 0 of 5 cycles  for chemotherapy treatment.       HORMONAL RISK FACTORS:  Menarche was at age 61.  First live birth at age 17.  OCP use for approximately 0 years.  Ovaries intact: yes.  Hysterectomy: no.  Menopausal status: postmenopausal.  HRT use: 0  years. Colonoscopy: no; not examined. Mammogram within the last year: yes. Number of breast biopsies: 1. Up to date with pelvic exams:  yes. Any excessive radiation exposure in the past:  no  Past Medical History:  Diagnosis Date  . Anemia   . Anxiety   . Arthritis    "all over" (08/12/2018)  . Asthma   . Breast cancer, right breast ([place]) dx'd 02/2018  . Chronic bronchitis ([place])   . Depression   . Family history of breast cancer   . Family history of prostate cancer   . Family history of uterine cancer   . GERD (gastroesophageal reflux disease)   . History of blood transfusion    "several; related to low blood" (08/12/2018)    Past Surgical History:  Procedure Laterality Date  . AXILLARY LYMPH NODE DISSECTION Right 08/12/2018   Procedure: AXILLARY LYMPH NODE DISSECTION;  Surgeon: Alphonsa Overall, MD;  Location: [facility];  Service: General;  Laterality: Right;  . BREAST BIOPSY Right 03/2018  . BREAST RECONSTRUCTION WITH PLACEMENT OF TISSUE EXPANDER AND FLEX HD (ACELLULAR HYDRATED DERMIS) Right 08/12/2018   Procedure: RIGHT BREAST RECONSTRUCTION WITH PLACEMENT OF TISSUE EXPANDER AND FLEX HD (ACELLULAR HYDRATED DERMIS);  Surgeon: Wallace Going, DO;  Location: [place];  Service: Plastics;  Laterality: Right;  . DILATION AND CURETTAGE OF UTERUS    . ENDOMETRIAL ABLATION    . IR IMAGING GUIDED PORT INSERTION  03/18/2018  . MASTECTOMY MODIFIED RADICAL Right 08/12/2018   w/axillary LND  . MASTECTOMY MODIFIED RADICAL Right 08/12/2018   Procedure: RIGHT MODIFIED RADICAL MASTECTOMY;  Surgeon: Alphonsa Overall, MD;  Location: [facility];  Service: General;  Laterality: Right;  . MYOMECTOMY      Social History   Socioeconomic History  . Marital status: Single    Spouse name: Not on file  . Number of children: Not on file  . Years of education: Not on file  . Highest education level: Not on file  Occupational History  . Not on file  Social Needs  . Financial resource strain: Not on file  . Food  insecurity:    Worry: Not on file    Inability: Not on file  . Transportation needs:    Medical: No    Non-medical: No  Tobacco Use  . Smoking status: Former Smoker    Packs/day: 1.00    Years: 38.00    Pack years: 38.00    Types: Cigarettes    Last attempt to quit: 05/15/2018    Years since quitting: 0.3  . Smokeless tobacco: Never Used  Substance and Sexual Activity  . Alcohol use: Not Currently    Comment: 08/12/2018 "nothing since 04/2018"  . Drug use: No  . Sexual activity: Not Currently    Birth control/protection: None  Lifestyle  . Physical activity:    Days per week: Not on file    Minutes per session: Not on file  . Stress: Not on file  Relationships  . Social connections:    Talks on phone: Not on file    Gets together: Not on file    Attends religious service: Not on  file    Active member of club or organization: Not on file    Attends meetings of clubs or organizations: Not on file    Relationship status: Not on file  Other Topics Concern  . Not on file  Social History Narrative  . Not on file     FAMILY HISTORY:  We obtained a detailed, 4-generation family history.  Significant diagnoses are listed below: Family History  Problem Relation Age of Onset  . Lupus Sister   . Heart disease Sister   . Breast cancer Mother 53  . Prostate cancer Paternal Uncle   . Diabetes Maternal Grandmother   . Heart Problems Maternal Grandmother   . Prostate cancer Maternal Grandfather   . Prostate cancer Paternal Grandfather   . Prostate cancer Other        MGFs brother  . Breast cancer Other        Mat great-grandmother's sister  . Uterine cancer Maternal Great-grandmother   . Prostate cancer Other        PGFs brother    The patient has one daughter who is cancer free.  She has four brothers and one sister who are all cancer free.  Her parents are living.  The patient's mother had breast cancer at 52 and had negative genetic testing, other than two CDH1 VUS.  Her  mother is an only child.  The maternal grandparents are deceased.  The grandmother had diabetes and the grandfather had prostate cancer.  The grandfathers brother also had prostate cancer.  The patient's father is living.  He had a sister and brother.  The brother had prostate cancer.  The paternal grandparents are deceased.  The grandfather, as well as his brother had prostate cancer. The grandmother died of old age.  Ms. Speers is aware of previous family history of genetic testing for hereditary cancer risks in her mother. Patient's maternal ancestors are of [place] American and Caucasian descent, and paternal ancestors are of African American descent. There is no reported Ashkenazi Jewish ancestry. There is no known consanguinity.  GENETIC COUNSELING ASSESSMENT: MINOLA GUIN is a 58 y.o. female with a personal and family history of cancer which is somewhat suggestive of a hereditary cancer syndrome and predisposition to cancer. We, therefore, discussed and recommended the following at today's visit.   DISCUSSION: We discussed that about 5-10% of breast cancer is hereditary with most cases due to BRCA mutations.  There are other genes that can increase the risk for breast cancer as well.  Her mother underwent genetic testing several years ago for hereditary mutations and was found to have two CDH1 VUS' but otherwise had negative testing.  Ms. Schnelle has bilineal risk based on her father's family history of prostate cancer, and therefore testing is appropriate.  We reviewed the characteristics, features and inheritance patterns of hereditary cancer syndromes. We also discussed genetic testing, including the appropriate family members to test, the process of testing, insurance coverage and turn-around-time for results. We discussed the implications of a negative, positive and/or variant of uncertain significant result. We recommended Ms. Worthey pursue genetic testing for the common hereditary cancer gene  panel. The Hereditary Gene Panel offered by [facility] includes sequencing and/or deletion duplication testing of the following 47 genes: APC, ATM, AXIN2, BARD1, BMPR1A, BRCA1, BRCA2, BRIP1, CDH1, CDK4, CDKN2A (p14ARF), CDKN2A (p16INK4a), CHEK2, CTNNA1, DICER1, EPCAM (Deletion/duplication testing only), GREM1 (promoter region deletion/duplication testing only), KIT, MEN1, MLH1, MSH2, MSH3, MSH6, MUTYH, NBN, NF1, NHTL1, PALB2, PDGFRA, PMS2, POLD1, POLE,  PTEN, RAD50, RAD51C, RAD51D, SDHB, SDHC, SDHD, SMAD4, SMARCA4. STK11, TP53, TSC1, TSC2, and VHL.  The following genes were evaluated for sequence changes only: SDHA and HOXB13 c.251G>A variant only.   Based on Ms. Dabbs personal and family history of cancer, she meets medical criteria for genetic testing. Despite that she meets criteria, she may still have an out of pocket cost. We discussed that if her out of pocket cost for testing is over $100, the laboratory will call and confirm whether she wants to proceed with testing.  If the out of pocket cost of testing is less than $100 she will be billed by the genetic testing laboratory.   PLAN: After considering the risks, benefits, and limitations, Ms. Mcnicholas  provided informed consent to pursue genetic testing and the blood sample was sent to [facility] for analysis of the common hereditary cancer panel. Results should be available within approximately 2-3 weeks' time, at which point they will be disclosed by telephone to Ms. Losasso, as will any additional recommendations warranted by these results. Ms. Corral will receive a summary of her genetic counseling visit and a copy of her results once available. This information will also be available in Epic. We encouraged Ms. Pudlo to remain in contact with cancer genetics annually so that we can continuously update the family history and inform her of any changes in cancer genetics and testing that may be of benefit for her family. Ms. Zalar questions were  answered to her satisfaction today. Our contact information was provided should additional questions or concerns arise.  Lastly, we encouraged Ms. Treloar to remain in contact with cancer genetics annually so that we can continuously update the family history and inform her of any changes in cancer genetics and testing that may be of benefit for this family.   Ms.  Monroy questions were answered to her satisfaction today. Our contact information was provided should additional questions or concerns arise. Thank you for the referral and allowing [place] to share in the care of your patient.   Antrone Walla P. Florene Glen, [place], [facility] Certified Genetic Counselor [email or web address] phone: [phone]  The patient was seen for a total of 48 minutes in face-to-face genetic counseling.  This patient was discussed with Drs. [name], Lindi Adie and/or Burr Medico who agrees with the above.    _______________________________________________________________________ For Office Staff:  Number of people involved in session: 2 Was an Intern/ student involved with case: no

## 2018-09-23 ENCOUNTER — Other Ambulatory Visit: Payer: Self-pay | Admitting: Oncology

## 2018-09-24 ENCOUNTER — Inpatient Hospital Stay: Payer: Medicaid Other | Admitting: General Practice

## 2018-09-24 NOTE — Progress Notes (Signed)
[place] CSW Progress Notes  Patient no show for scheduled appointment, called and left VM inquiring if she would like to reschedule.  Edwyna Shell, LCSW Clinical Social Worker Phone:  [phone]

## 2018-09-28 ENCOUNTER — Ambulatory Visit (INDEPENDENT_AMBULATORY_CARE_PROVIDER_SITE_OTHER): Payer: Medicaid Other | Admitting: Plastic Surgery

## 2018-09-28 ENCOUNTER — Encounter: Payer: Self-pay | Admitting: Plastic Surgery

## 2018-09-28 VITALS — BP 100/61 | HR 68 | Temp 99.0°F | Ht 64.0 in | Wt 186.0 lb

## 2018-09-28 DIAGNOSIS — Z9011 Acquired absence of right breast and nipple: Secondary | ICD-10-CM

## 2018-09-28 DIAGNOSIS — C50411 Malignant neoplasm of upper-outer quadrant of right female breast: Secondary | ICD-10-CM

## 2018-09-28 DIAGNOSIS — Z171 Estrogen receptor negative status [ER-]: Secondary | ICD-10-CM

## 2018-09-28 NOTE — Progress Notes (Signed)
   Subjective:    Patient ID: Jacqueline Good, female    DOB: 08/20/1960, 58 y.o.   MRN: [id number]  The patient is a 58 year old female here for follow-up on her right breast reconstruction.  I spoke with Dr. Isidore Moos last week and we have agreed to continue with expansion and try to move to implant placement in the next month.  After that she would then be radiated.  I shared this with the patient today and she agrees with the plan.  She has no redness today.  She states that she had a little bit of drainage in the last couple days but none today.  And I do not see any on exam.  She denies any fevers.    Review of Systems  Constitutional: Negative.   HENT: Negative.   Eyes: Negative.   Respiratory: Negative.   Gastrointestinal: Negative.   Genitourinary: Negative.   Musculoskeletal: Negative.        Objective:   Physical Exam Vitals signs and nursing note reviewed.  Constitutional:      Appearance: Normal appearance.  HENT:     Head: Normocephalic and atraumatic.  Cardiovascular:     Rate and Rhythm: Normal rate.  Pulmonary:     Effort: Pulmonary effort is normal.  Abdominal:     General: Abdomen is flat.  Neurological:     Mental Status: She is alert.  Psychiatric:        Mood and Affect: Mood normal.        Thought Content: Thought content normal.       Assessment & Plan:  Malignant neoplasm of upper-outer quadrant of right breast in female, estrogen receptor negative (HCC)  S/P mastectomy, right  Acquired absence of right breast We placed injectable saline in the Expander using a sterile technique: Right: 50 cc for a total of 340 / 535 cc Plan to continue expansion as quick as possible and then changed with the hope of getting her to radiation in the next 2 months.

## 2018-10-01 ENCOUNTER — Encounter: Payer: Self-pay | Admitting: Genetic Counselor

## 2018-10-01 ENCOUNTER — Inpatient Hospital Stay: Payer: Medicaid Other | Admitting: General Practice

## 2018-10-01 ENCOUNTER — Ambulatory Visit: Payer: Self-pay | Admitting: Genetic Counselor

## 2018-10-01 DIAGNOSIS — Z1379 Encounter for other screening for genetic and chromosomal anomalies: Secondary | ICD-10-CM

## 2018-10-01 NOTE — Progress Notes (Signed)
HPI:  Ms. Absher was previously seen in the [facility] on September 22, 2018 due to a personal and family history of breast cancer and concerns regarding a hereditary predisposition to cancer. Please refer to our prior cancer genetics clinic note for more information regarding Ms. Ostermann's medical, social and family histories, and our assessment and recommendations, at the time. Ms. Gambrill recent genetic test results were disclosed to her, as well as recommendations warranted by these results. These results and recommendations are discussed in more detail below.  CANCER HISTORY:    Malignant neoplasm of upper-outer quadrant of right breast in female, estrogen receptor negative ([place])   03/02/2018 Initial Diagnosis    Malignant neoplasm of upper-outer quadrant of right breast in female, estrogen receptor negative ([place])    03/23/2018 -  Chemotherapy    The patient had palonosetron (ALOXI) injection 0.25 mg, 0.25 mg, Intravenous,  Once, 6 of 6 cycles Administration: 0.25 mg (03/23/2018), 0.25 mg (04/13/2018), 0.25 mg (05/04/2018), 0.25 mg (06/24/2018), 0.25 mg (07/12/2018), 0.25 mg (05/31/2018) pegfilgrastim (NEULASTA) injection 6 mg, 6 mg, Subcutaneous, Once, 4 of 4 cycles Administration: 6 mg (03/25/2018), 6 mg (04/15/2018), 6 mg (05/06/2018), 6 mg (07/14/2018) trastuzumab (HERCEPTIN) 750 mg in sodium chloride 0.9 % 250 mL chemo infusion, 777 mg, Intravenous,  Once, 9 of 17 cycles Administration: 750 mg (03/23/2018), 600 mg (04/13/2018), 600 mg (05/04/2018), 600 mg (06/24/2018), 600 mg (07/12/2018), 600 mg (08/06/2018), 588 mg (09/22/2018), 600 mg (05/31/2018), 588 mg (09/01/2018) CARBOplatin (PARAPLATIN) 720 mg in sodium chloride 0.9 % 250 mL chemo infusion, 720 mg (100 % of original dose 721 mg), Intravenous,  Once, 6 of 6 cycles Dose modification:   (original dose 721 mg, Cycle 1) Administration: 720 mg (03/23/2018), 650 mg (04/13/2018), 670 mg (05/04/2018), 710 mg (06/24/2018), 620 mg (07/12/2018),  710 mg (05/31/2018) DOCEtaxel (TAXOTERE) 160 mg in sodium chloride 0.9 % 250 mL chemo infusion, 75 mg/m2 = 160 mg, Intravenous,  Once, 3 of 3 cycles Administration: 160 mg (03/23/2018), 160 mg (04/13/2018), 160 mg (05/04/2018) gemcitabine (GEMZAR) 1,672 mg in sodium chloride 0.9 % 250 mL chemo infusion, 800 mg/m2 = 1,672 mg (100 % of original dose 800 mg/m2), Intravenous,  Once, 3 of 3 cycles Dose modification: 800 mg/m2 (original dose 800 mg/m2, Cycle 4, Reason: Provider Judgment) Administration: 1,672 mg (05/31/2018), 1,672 mg (06/24/2018), 1,672 mg (07/12/2018) pertuzumab (PERJETA) 840 mg in sodium chloride 0.9 % 250 mL chemo infusion, 840 mg, Intravenous, Once, 1 of 1 cycle Administration: 840 mg (03/23/2018) fosaprepitant (EMEND) 150 mg, dexamethasone (DECADRON) 12 mg in sodium chloride 0.9 % 145 mL IVPB, , Intravenous,  Once, 6 of 6 cycles Administration:  (03/23/2018),  (04/13/2018),  (05/04/2018),  (06/24/2018),  (07/12/2018),  (05/31/2018)  for chemotherapy treatment.     09/22/2018 - 09/22/2018 Chemotherapy    The patient had trastuzumab (HERCEPTIN) 714 mg in sodium chloride 0.9 % 250 mL chemo infusion, 8 mg/kg = 714 mg, Intravenous,  Once, 0 of 5 cycles  for chemotherapy treatment.     09/29/2018 Genetic Testing    Negative genetic testing on the common hereditary cancer panel.  The Hereditary Gene Panel offered by [facility] includes sequencing and/or deletion duplication testing of the following 47 genes: APC, ATM, AXIN2, BARD1, BMPR1A, BRCA1, BRCA2, BRIP1, CDH1, CDK4, CDKN2A (p14ARF), CDKN2A (p16INK4a), CHEK2, CTNNA1, DICER1, EPCAM (Deletion/duplication testing only), GREM1 (promoter region deletion/duplication testing only), KIT, MEN1, MLH1, MSH2, MSH3, MSH6, MUTYH, NBN, NF1, NHTL1, PALB2, PDGFRA, PMS2, POLD1, POLE, PTEN, RAD50, RAD51C, RAD51D, SDHB, SDHC,  SDHD, SMAD4, SMARCA4. STK11, TP53, TSC1, TSC2, and VHL.  The following genes were evaluated for sequence changes only: SDHA and HOXB13 c.251G>A  variant only. The report date is September 29, 2018.      FAMILY HISTORY:  We obtained a detailed, 4-generation family history.  Significant diagnoses are listed below: Family History  Problem Relation Age of Onset  . Lupus Sister   . Heart disease Sister   . Breast cancer Mother 72  . Prostate cancer Paternal Uncle   . Diabetes Maternal Grandmother   . Heart Problems Maternal Grandmother   . Prostate cancer Maternal Grandfather   . Prostate cancer Paternal Grandfather   . Prostate cancer Other        MGFs brother  . Breast cancer Other        Mat great-grandmother's sister  . Uterine cancer Maternal Great-grandmother   . Prostate cancer Other        PGFs brother    The patient has one daughter who is cancer free.  She has four brothers and one sister who are all cancer free.  Her parents are living.  The patient's mother had breast cancer at 54 and had negative genetic testing, other than two CDH1 VUS.  Her mother is an only child.  The maternal grandparents are deceased.  The grandmother had diabetes and the grandfather had prostate cancer.  The grandfathers brother also had prostate cancer.  The patient's father is living.  He had a sister and brother.  The brother had prostate cancer.  The paternal grandparents are deceased.  The grandfather, as well as his brother had prostate cancer. The grandmother died of old age.  Ms. Zackery is aware of previous family history of genetic testing for hereditary cancer risks in her mother. Patient's maternal ancestors are of [place] American and Caucasian descent, and paternal ancestors are of African American descent. There is no reported Ashkenazi Jewish ancestry. There is no known consanguinity.  GENETIC TEST RESULTS: Genetic testing performed through [facility]'s Common Hereditary Cancer Panel reported out on September 30, 2018 showed no pathogenic mutations. The Hereditary Gene Panel offered by [facility] includes sequencing and/or deletion  duplication testing of the following 47 genes: APC, ATM, AXIN2, BARD1, BMPR1A, BRCA1, BRCA2, BRIP1, CDH1, CDK4, CDKN2A (p14ARF), CDKN2A (p16INK4a), CHEK2, CTNNA1, DICER1, EPCAM (Deletion/duplication testing only), GREM1 (promoter region deletion/duplication testing only), KIT, MEN1, MLH1, MSH2, MSH3, MSH6, MUTYH, NBN, NF1, NHTL1, PALB2, PDGFRA, PMS2, POLD1, POLE, PTEN, RAD50, RAD51C, RAD51D, SDHB, SDHC, SDHD, SMAD4, SMARCA4. STK11, TP53, TSC1, TSC2, and VHL.  The following genes were evaluated for sequence changes only: SDHA and HOXB13 c.251G>A variant only.   The test report will be scanned into EPIC and will be located under the Molecular Pathology section of the Results Review tab. A portion of the result report is included below for reference.     We discussed with Ms. Urbas that because current genetic testing is not perfect, it is possible there may be a gene mutation in one of these genes that current testing cannot detect, but that chance is small.  We also discussed, that there could be another gene that has not yet been discovered, or that we have not yet tested, that is responsible for the cancer diagnoses in the family. It is also possible there is a hereditary cause for the cancer in the family that Ms. Gassman did not inherit and therefore was not identified in her testing.  Therefore, it is important to remain in touch with cancer genetics  in the future so that we can continue to offer Ms. Chiem the most up to date genetic testing.   ADDITIONAL GENETIC TESTING: We discussed with Ms. Wehling that there are other genes that are associated with increased cancer risk that can be analyzed. The laboratories that offer this testing look at these additional genes via a hereditary cancer gene panel. Should Ms. Mayorga wish to pursue additional genetic testing, we are happy to discuss and coordinate this testing, at any time.    CANCER SCREENING RECOMMENDATIONS:  This negative result means that we were  unable to identify a hereditary cause for her personal and family history of cancer at this time.  This result does not rule out a hereditary cause for her  cancer.  It is still possible that there could be genetic mutations that are undetectable by current technology, or genetic mutations in genes that have not been tested or identified to increase cancer risk.  Ms. Spoonemore test result is considered negative (normal).  This means that we have not identified a hereditary cause for her personal and family history of cancer at this time. While reassuring, this does not definitively rule out a hereditary predisposition to cancer. It is still possible that there could be genetic mutations that are undetectable by current technology, or genetic mutations in genes that have not been tested or identified to increase cancer risk.  Therefore, it is recommended she continue to follow the cancer management and screening guidelines provided by her oncology and primary healthcare provider. An individual's cancer risk is not determined by genetic test results alone.  Overall cancer risk assessment includes additional factors such as personal medical history, family history, etc.  These should be used to make a personalized plan for cancer prevention and surveillance.    This result indicates that it is unlikely Ms. Scrogham has an increased risk for a future cancer due to a mutation in one of these genes. This normal test also suggests that Ms. Boesel's cancer was most likely not due to an inherited predisposition associated with one of these genes.  Most cancers happen by chance and this negative test suggests that her cancer may fall into this category.  Therefore, it is recommended she continue to follow the cancer management and screening guidelines provided by her oncology and primary healthcare provider. Other factors such as her personal and family history may still affect her cancer risk.  RECOMMENDATIONS FOR FAMILY  MEMBERS:  Relatives in this family might be at some increased risk of developing cancer, over the general population risk, simply due to the family history of cancer.  We recommended women in this family have a yearly mammogram beginning at age 19, or 44 years younger than the earliest onset of cancer, an annual clinical breast exam, and perform monthly breast self-exams. Women in this family should also have a gynecological exam as recommended by their primary provider. All family members should have a colonoscopy by age 92 (or as directed by their doctors).  All family members should inform their physicians about the family history of cancer so their doctors can make the most appropriate screening recommendations for them.   FOLLOW-UP: Lastly, we discussed with Ms. Pereyra that cancer genetics is a rapidly advancing field and it is possible that new genetic tests will be appropriate for her and/or her family members in the future. We encouraged her to remain in contact with cancer genetics on an annual basis so we can update her personal and family histories  and let her know of advances in cancer genetics that may benefit this family.   Our contact number was provided. Ms. Macfadden questions were answered to her satisfaction, and she knows she is welcome to call us at anytime with additional questions or concerns.   Santiago Glad L. [place], [place], [facility] Certified Genetic Counselor [email or web address]

## 2018-10-07 ENCOUNTER — Telehealth: Payer: Self-pay | Admitting: Plastic Surgery

## 2018-10-07 NOTE — Telephone Encounter (Signed)
Patient requesting call back due to clinical issue. Patient has appt on Tuesday, but is requesting to speak with staff.

## 2018-10-08 ENCOUNTER — Inpatient Hospital Stay: Payer: Medicaid Other | Admitting: General Practice

## 2018-10-08 ENCOUNTER — Telehealth: Payer: Self-pay

## 2018-10-08 ENCOUNTER — Ambulatory Visit (INDEPENDENT_AMBULATORY_CARE_PROVIDER_SITE_OTHER): Payer: Medicaid Other | Admitting: Plastic Surgery

## 2018-10-08 ENCOUNTER — Encounter: Payer: Self-pay | Admitting: Plastic Surgery

## 2018-10-08 VITALS — BP 112/78 | HR 70 | Temp 98.1°F | Ht 64.0 in | Wt 185.0 lb

## 2018-10-08 DIAGNOSIS — C50411 Malignant neoplasm of upper-outer quadrant of right female breast: Secondary | ICD-10-CM

## 2018-10-08 DIAGNOSIS — Z171 Estrogen receptor negative status [ER-]: Secondary | ICD-10-CM

## 2018-10-08 DIAGNOSIS — Z9011 Acquired absence of right breast and nipple: Secondary | ICD-10-CM

## 2018-10-08 MED ORDER — SULFAMETHOXAZOLE-TRIMETHOPRIM 800-160 MG PO TABS: 1.0000 | ORAL_TABLET | Freq: Two times a day (BID) | ORAL | 0 refills | Status: AC

## 2018-10-08 MED ORDER — SULFAMETHOXAZOLE-TRIMETHOPRIM 800-160 MG PO TABS: 1.0000 | ORAL_TABLET | Freq: Two times a day (BID) | ORAL | 0 refills | Status: DC

## 2018-10-08 MED FILL — SULFAMETHOXAZOLE-TMP DS TAB: 800-160 | 5 days supply | Qty: 10 | Fill #0

## 2018-10-08 NOTE — Progress Notes (Signed)
[facility] CSW Progress Notes  Met w patient in [place] office, patient requested to meet earlier as she had urgent visit w surgeon due to drainage/infection this morning.  Tearful, fearful infection may derail her hopes of having breast implant.  Working Data processing manager to resolve issue.  Wants help w switching provider for mental health medication management - prior provider at [facility] does not accept Medicaid and patient has received bill for services received there in the past.  Discussed options available under [facility] providers - will discuss option of referral to [facility] (patient has been told she could be seen there under Medicaid by referral from prior provider, CSW will check this out to see if it's possible) or other options including [facility] and [facility].  Both these latter facilities do take Medicaid and are possible referrals.  Discussed ways to manage anxiety and feelings of hopelessness related to current medical condition and treatments, provided supportive resources.  Will connect w patient at her next [facility] appointment.  Edwyna Shell, LCSW Clinical Social Worker Phone:  [phone]

## 2018-10-08 NOTE — Addendum Note (Signed)
Addended by: Wallace Going on: 10/08/2018 01:48 PM   Modules accepted: Orders

## 2018-10-08 NOTE — Telephone Encounter (Signed)
Call back to pt to assess her concerns regarding left breast/tissue expander  She has c/o increased pain left breast (5) on pain scale Increased redness & swelling & increased drainage described as "pus" & with foul odor Denies fever & she has completed all the antibiotics  I consulted with Dr. Marla Roe & she would like for pt to come in today for evaluation  Pt will be here @ 9:30am  [place], RNFA

## 2018-10-08 NOTE — Progress Notes (Signed)
   Subjective:    Patient ID: Jacqueline Good, female    DOB: April 14, 1961, 58 y.o.   MRN: [id number]  The patient is a 58 years old black female here for follow-up on her right breast reconstruction.  We are trying to get through this quickly so that she can have her radiation with Dr. Isidore Moos.  Today she had some concerns about some discomfort and swelling.  I removed 50 cc from her expander.  She did not seem to have any seroma within the pocket.  Radiation is planned for April.  There is a 2 mm open area on the medial aspect of the incision.  Patient states that this has been draining at home.  She denies any fevers.    Review of Systems  Constitutional: Negative for activity change and appetite change.  Respiratory: Negative.   Cardiovascular: Negative.   Gastrointestinal: Negative.   Genitourinary: Negative.   Musculoskeletal: Negative.   Skin: Positive for wound.       Objective:   Physical Exam Vitals signs and nursing note reviewed.  Constitutional:      Appearance: Normal appearance.  HENT:     Mouth/Throat:     Mouth: Mucous membranes are moist.  Cardiovascular:     Pulses: Normal pulses.  Pulmonary:     Effort: Pulmonary effort is normal.  Chest:    Neurological:     Mental Status: She is alert.  Psychiatric:        Mood and Affect: Mood normal.        Thought Content: Thought content normal.        Judgment: Judgment normal.        Assessment & Plan:  Acquired absence of right breast  S/P mastectomy, right  I will start antibiotics - Bactrim.  I offered IV antibiotics inpatient but patient refused.  It is reasonable to try outpatient as there is no cellulitis.  50 cc removed from the expander.   Total now is 290/535 cc. I am concerned that we may not be able to expand in time and exchange.  She may need to have the expander removed or get the radiation and then have a latissimus muscle flap.

## 2018-10-12 ENCOUNTER — Ambulatory Visit: Payer: Medicaid Other | Admitting: Plastic Surgery

## 2018-10-12 NOTE — Progress Notes (Signed)
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 She is accompanied by a friend or family member.  She continues on trastuzumab. Today is day 1 cycle 10.   Jacqueline Good's last echocardiogram on 06/25/2018, showed an ejection fraction in the 55% - 60% range.   Since her last visit here, she has not undergone any additional studies.     REVIEW OF SYSTEMS: [place] recently had an infection with drainage of her expander 09/21/2018, for which she was prescribed antibiotics (Bactrim) for two weeks. The infected site was draining and smelled. On 10/08/2018 her infection returned and she was once again given Bactrum. The drainage stopped on 10/10/2018. She notes that her breast has been warm to the touch. She also had a fever. She does not want to have her expander removed because she says that she wants a breast and was not aware of any other options. She became visibly upset while talking about this issue. She has been fatigued recently as well. The patient denies unusual headaches, visual changes, nausea, vomiting, or dizziness. There has been no unusual cough, phlegm production, or pleurisy. This been no change in bowel or bladder habits. The patient denies unexplained fatigue or unexplained weight loss, bleeding, or rash. A  detailed review of systems was otherwise noncontributory.    HISTORY OF CURRENT ILLNESS: From the original intake note:  Jacqueline Good had routine screening mammography on 02/05/2018 showing a possible abnormality in the right breast. She underwent unilateral right diagnostic mammography with tomography and right breast ultrasonography at [facility] on 02/15/2018 showing: Highly suspicious right breast mass at 10 o'clock position.  There was a second, 0.6 cm lesion at the 10:00 radiant which has not been biopsied.  Suspicious focal cortical thickening of a single right axillary lymph node.  Accordingly on 02/25/2018 she proceeded to biopsy of the right breast area in question. 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   The patient's subsequent history is as detailed below.    PAST MEDICAL HISTORY: Past Medical History:  Diagnosis Date  . Anemia   . Anxiety   . Arthritis    "all over" (08/12/2018)  . Asthma   . Breast cancer, right breast ([place]) dx'd 02/2018  . Chronic bronchitis ([place])   . Depression   . Family history of breast cancer   . Family history of prostate cancer   . Family history of uterine cancer   . GERD (gastroesophageal reflux disease)   . History of blood transfusion    "several; related to low blood" (08/12/2018)    PAST SURGICAL HISTORY: Past Surgical History:  Procedure Laterality Date  . AXILLARY LYMPH NODE DISSECTION Right 08/12/2018  Procedure: AXILLARY LYMPH NODE DISSECTION;  Surgeon: Alphonsa Overall, MD;  Location: [facility];  Service: General;  Laterality: Right;  . BREAST BIOPSY Right 03/2018  . BREAST RECONSTRUCTION WITH PLACEMENT OF TISSUE EXPANDER AND FLEX HD (ACELLULAR  HYDRATED DERMIS) Right 08/12/2018   Procedure: RIGHT BREAST RECONSTRUCTION WITH PLACEMENT OF TISSUE EXPANDER AND FLEX HD (ACELLULAR HYDRATED DERMIS);  Surgeon: Wallace Going, DO;  Location: [facility];  Service: Plastics;  Laterality: Right;  . DILATION AND CURETTAGE OF UTERUS    . ENDOMETRIAL ABLATION    . IR IMAGING GUIDED PORT INSERTION  03/18/2018  . MASTECTOMY MODIFIED RADICAL Right 08/12/2018   w/axillary LND  . MASTECTOMY MODIFIED RADICAL Right 08/12/2018   Procedure: RIGHT MODIFIED RADICAL MASTECTOMY;  Surgeon: Alphonsa Overall, MD;  Location: [facility];  Service: General;  Laterality: Right;  . MYOMECTOMY      FAMILY HISTORY: Family History  Problem Relation Age of Onset  . Lupus Sister   . Heart disease Sister   . Breast cancer Mother 50  . Prostate cancer Paternal Uncle   . Diabetes Maternal Grandmother   . Heart Problems Maternal Grandmother   . Prostate cancer Maternal Grandfather   . Prostate cancer Paternal Grandfather   . Prostate cancer Other        MGFs brother  . Breast cancer Other        Mat great-grandmother's sister  . Uterine cancer Maternal Great-grandmother   . Prostate cancer Other        PGFs brother   She notes that her father is currently 34 years old. Patients' mother is currently 89 years old and she was diagnosed with breast cancer at age 55. The patient has 4 brothers and 1 sister. She has a maternal aunt with breast cancer and her maternal great grandmother with had ovarian cancer.    GYNECOLOGIC HISTORY:  No LMP recorded. Patient is postmenopausal. Menarche: 58 years old Age at first live birth: 57 years old GX P: 1 LMP: 13-14 years ago s/p ablation Contraceptive: no HRT: no  Hysterectomy: no SO: no   SOCIAL HISTORY: She is currently unemployed.  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 She lives alone without pets. Her daughter is Jacqueline Good who lives in [place] and works in Therapist, art.  The patient has one grandchild.  She goes to a [employer].   ADVANCED DIRECTIVES: Not in place.  At the 03/03/2018 visit the patient was given the appropriate documents to complete and notarized at her discretion   HEALTH MAINTENANCE: Social History   Tobacco Use  . Smoking status: Former Smoker    Packs/day: 1.00    Years: 38.00    Pack years: 38.00    Types: Cigarettes    Last attempt to quit: 05/15/2018    Years since quitting: 0.4  . Smokeless tobacco: Never Used  Substance Use Topics  . Alcohol use: Not Currently    Comment: 08/12/2018 "nothing since 04/2018"  . Drug use: No     Colonoscopy: Never  PAP:   Bone density: Never   Allergies  Allergen Reactions  . Gadolinium Derivatives Itching and Cough    Pt began sneezing and coughing as soon as MRI contrast was injected. Severe nasal congestion. No rash or hives no SOB.   Marland Kitchen Hydrocodone Hives and Itching    Pt says she can take it with benadryl.   . Iodinated Diagnostic Agents Itching    Current Outpatient Medications  Medication Sig Dispense Refill  .  albuterol (VENTOLIN HFA) 108 (90 Base) MCG/ACT inhaler INHALE 2 PUFFS INTO THE LUNGS EVERY 6 HOURS AS NEEDED FOR WHEEZING (Patient taking differently: Inhale 2 puffs into the lungs every 6 (six) hours as needed for wheezing or shortness of breath. ) 54 g 3  . ALPRAZolam (XANAX) 1 MG tablet 1 po q 6 hr prn.  No more than 3.5 qd. 105 tablet 3  . busPIRone (BUSPAR) 30 MG tablet Take 1 tablet (30 mg total) by mouth 2 (two) times daily. 60 tablet 3  . dexamethasone (DECADRON) 4 MG tablet Take 2 tablets (8 mg total) by mouth 2 (two) times daily. Start the day before Taxotere. Take once the day after, then 2 times a day x 2d. (Patient not taking: Reported on 09/22/2018) 30 tablet 1  . diphenoxylate-atropine (LOMOTIL) 2.5-0.025 MG tablet Take 1 tablet by mouth 4 (four) times daily as needed for diarrhea or loose stools. (Patient not taking: Reported on 09/22/2018) 30 tablet 0  . fluticasone (FLONASE) 50  MCG/ACT nasal spray Place 2 sprays into both nostrils daily. 1 g 0  . Fluticasone-Salmeterol (ADVAIR DISKUS) 250-50 MCG/DOSE AEPB INHALE 1 PUFF INTO THE LUNGS 2 TIMES DAILY. (Patient taking differently: Inhale 1 puff into the lungs 2 (two) times daily as needed (wheezing or shortness of breath). ) 60 each 2  . lidocaine-prilocaine (EMLA) cream Apply to affected area once 30 g 3  . loperamide (IMODIUM) 2 MG capsule Take 2 mg by mouth as needed for diarrhea or loose stools.    Marland Kitchen loratadine (CLARITIN) 10 MG tablet Take 10 mg by mouth daily as needed for allergies.    Marland Kitchen omeprazole (PRILOSEC) 40 MG capsule Take 1 capsule (40 mg total) by mouth daily. 30 capsule 2  . propranolol (INDERAL) 20 MG tablet Take 1 tablet (20 mg total) by mouth 3 (three) times daily as needed (palpitations). (Patient not taking: Reported on 09/22/2018) 90 tablet 0  . propranolol (INDERAL) 40 MG tablet Take 1 tablet by mouth daily.    Marland Kitchen sulfamethoxazole-trimethoprim (BACTRIM DS) 800-160 MG tablet Take 1 tablet by mouth 2 (two) times daily for 5 days. 10 tablet 0  . venlafaxine (EFFEXOR) 75 MG tablet Take 3 tablets (225 mg total) by mouth daily. Take 2 qd until 08/07/18 and then start 3 po qd. 90 tablet 3  . zolpidem (AMBIEN) 10 MG tablet Take 1 tablet (10 mg total) by mouth at bedtime as needed for sleep (1/2-1 tab). 30 tablet 3   No current facility-administered medications for this visit.     OBJECTIVE: Middle-aged African-American woman who appears stated age  58:   10/13/18 1058  BP: 108/65  Pulse: (!) 59  Resp: 18  Temp: 98 F (36.7 C)  SpO2: 99%     Body mass index is 34.64 kg/m.   Wt Readings from Last 3 Encounters:  10/13/18 201 lb 12.8 oz (91.5 kg)  10/08/18 185 lb (83.9 kg)  09/28/18 186 lb (84.4 kg)  ECOG FS:1 - Symptomatic but completely ambulatory  Sclerae unicteric, pupils round and equal No cervical or supraclavicular adenopathy Lungs no rales or rhonchi Heart regular rate and rhythm Abd  soft, nontender, positive bowel sounds MSK no focal spinal tenderness, no upper extremity lymphedema Neuro: nonfocal, well oriented, appropriate affect Breasts: The right breast is status post mastectomy with expander in place.  It is imaged below.  The left breast is benign.  Both axillae are benign.  10/13/2018 right breast      LAB RESULTS:  CMP     Component Value Date/Time   NA 140 09/22/2018 0933   K 4.7 09/22/2018 0933   CL 107 09/22/2018 0933   CO2 22 09/22/2018 0933   GLUCOSE 86 09/22/2018 0933   BUN 25 (H) 09/22/2018 0933   CREATININE 1.31 (H) 09/22/2018 0933   CREATININE 0.80 03/03/2018 0830   CREATININE 0.83 09/24/2016 1151   CALCIUM 9.6 09/22/2018 0933   PROT 7.6 09/22/2018 0933   ALBUMIN 3.7 09/22/2018 0933   AST 16 09/22/2018 0933   AST 15 03/03/2018 0830   ALT 15 09/22/2018 0933   ALT 16 03/03/2018 0830   ALKPHOS 99 09/22/2018 0933   BILITOT 0.4 09/22/2018 0933   BILITOT 0.5 03/03/2018 0830   GFRNONAA 45 (L) 09/22/2018 0933   GFRNONAA >60 03/03/2018 0830   GFRNONAA 79 09/24/2016 1151   GFRAA 52 (L) 09/22/2018 0933   GFRAA >60 03/03/2018 0830   GFRAA >89 09/24/2016 1151    No results found for: TOTALPROTELP, ALBUMINELP, A1GS, A2GS, BETS, BETA2SER, GAMS, MSPIKE, SPEI  No results found for: KPAFRELGTCHN, LAMBDASER, KAPLAMBRATIO  Lab Results  Component Value Date   WBC 6.7 10/13/2018   NEUTROABS 4.5 10/13/2018   HGB 9.5 (L) 10/13/2018   HCT 30.4 (L) 10/13/2018   MCV 93.3 10/13/2018   PLT 227 10/13/2018    '@LASTCHEMISTRY'$ @  No results found for: LABCA2  No components found for: [id number]  No results for input(s): INR in the last 168 hours.  No results found for: LABCA2  No results found for: WUJ811  No results found for: BJY782  No results found for: NFA213  No results found for: CA2729  No components found for: HGQUANT  No results found for: CEA1 / No results found for: CEA1   No results found for: AFPTUMOR  No results found  for: CHROMOGRNA  No results found for: PSA1  Appointment on 10/13/2018  Component Date Value Ref Range Status  . WBC 10/13/2018 6.7  4.0 - 10.5 K/uL Final  . RBC 10/13/2018 3.26* 3.87 - 5.11 MIL/uL Final  . Hemoglobin 10/13/2018 9.5* 12.0 - 15.0 g/dL Final  . HCT 10/13/2018 30.4* 36.0 - 46.0 % Final  . MCV 10/13/2018 93.3  80.0 - 100.0 fL Final  . MCH 10/13/2018 29.1  26.0 - 34.0 pg Final  . MCHC 10/13/2018 31.3  30.0 - 36.0 g/dL Final  . RDW 10/13/2018 16.2* 11.5 - 15.5 % Final  . Platelets 10/13/2018 227  150 - 400 K/uL Final  . nRBC 10/13/2018 0.0  0.0 - 0.2 % Final  . Neutrophils Relative % 10/13/2018 67  % Final  . Neutro Abs 10/13/2018 4.5  1.7 - 7.7 K/uL Final  . Lymphocytes Relative 10/13/2018 24  % Final  . Lymphs Abs 10/13/2018 1.6  0.7 - 4.0 K/uL Final  . Monocytes Relative 10/13/2018 6  % Final  . Monocytes Absolute 10/13/2018 0.4  0.1 - 1.0 K/uL Final  . Eosinophils Relative 10/13/2018 3  % Final  . Eosinophils Absolute 10/13/2018 0.2  0.0 - 0.5 K/uL Final  . Basophils Relative 10/13/2018 0  % Final  . Basophils Absolute 10/13/2018 0.0  0.0 - 0.1 K/uL Final  . Immature Granulocytes 10/13/2018 0  % Final  . Abs Immature Granulocytes 10/13/2018 0.03  0.00 - 0.07 K/uL Final   Performed at [facility], [place] [address], [place], [place] [address]    (this displays the last labs from the last 3 days)  No results found for: TOTALPROTELP,  ALBUMINELP, A1GS, A2GS, BETS, BETA2SER, GAMS, MSPIKE, SPEI (this displays SPEP labs)  No results found for: KPAFRELGTCHN, LAMBDASER, KAPLAMBRATIO (kappa/lambda light chains)  No results found for: HGBA, HGBA2QUANT, HGBFQUANT, HGBSQUAN (Hemoglobinopathy evaluation)   No results found for: LDH  No results found for: IRON, TIBC, IRONPCTSAT (Iron and TIBC)  No results found for: FERRITIN  Urinalysis    Component Value Date/Time   COLORURINE CANCELED 09/24/2016 1151   APPEARANCEUR CANCELED 09/24/2016 1151     LABSPEC CANCELED 09/24/2016 1151   PHURINE CANCELED 09/24/2016 1151   GLUCOSEU CANCELED 09/24/2016 1151   HGBUR CANCELED 09/24/2016 1151   BILIRUBINUR CANCELED 09/24/2016 1151   KETONESUR CANCELED 09/24/2016 1151   PROTEINUR CANCELED 09/24/2016 1151   NITRITE CANCELED 09/24/2016 1151   LEUKOCYTESUR CANCELED 09/24/2016 1151     STUDIES:  No results found.   ELIGIBLE FOR AVAILABLE RESEARCH PROTOCOL:BCEP   ASSESSMENT: 57 y.o.  woman status post right breast upper outer quadrant biopsy 02/25/2018 for a clinical T2 pN1, stage IIB invasive ductal carcinoma, estrogen and progesterone receptor negative, HER-2 amplified, with an MIB-1 of 50%.  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 STK11, TP53, TSC1, TSC2, and VHL.  The following genes were evaluated for sequence changes only: SDHA and HOXB13 c.251G>A variant only.   (2) neoadjuvant chemotherapy consisting of carboplatin, docetaxel, trastuzumab and Pertuzumab every 21 days x 6 starting 03/23/2018, completed 07/12/2018  (a) pertuzumab omitted after cycle 1 due to poorly-controlled diarrhea  (b) docetaxel discontinued after cycle 3 due to neuropathy; gemcitabine substituted  (c) Granix substituted for Neulasta on days 3-6 following chemotherapy due to improved tolerance  (d) Switched back to Neulasta after cycles 5 and 6 per patient request  (3) anti-HER-2 treatment will continue for 12  months  (a) baseline echocardiogram 03/16/2018 shows an ejection fraction in the 55-60% range.  (b) echo on 11/15 shows EF of 55-60%  (4) status post right modified radical mastectomy 08/12/2018 showing a complete pathologic response (ypT0 ypN0)  (a) a total of 11 regional lymph nodes removed  (b) expander in place  (5) adjuvant radiation pending  (6) left adrenal adenoma measuring 2.9 cm on CT of the chest 03/16/2018   PLAN: Jacqueline Good continues to have some difficulties with reconstruction and this is delaying her adjuvant radiation.  I do think this needs to be started relatively soon and I have alerted Dr. Isidore Moos to this issue.  From our point of view she is tolerating trastuzumab well and she will continue it through August of this year.  She is due for an echocardiogram and I have entered the order for that to be done within the next week or 2.  She knows to call for any other issue that may develop before the next visit.   [name], [name], MD  10/13/18 11:32 AM Medical Oncology and Hematology [facility] [address] [employer], [place] [address] Tel. [phone]    Fax. [phone]  I, [employer] am acting as a Education administrator for Chauncey Cruel, MD.   I, Lurline Del MD,  have reviewed the above documentation for accuracy and completeness, and I agree with the above.

## 2018-10-13 ENCOUNTER — Inpatient Hospital Stay: Payer: Medicaid Other | Admitting: General Practice

## 2018-10-13 ENCOUNTER — Inpatient Hospital Stay: Payer: Medicaid Other

## 2018-10-13 ENCOUNTER — Other Ambulatory Visit: Payer: Self-pay

## 2018-10-13 ENCOUNTER — Inpatient Hospital Stay (HOSPITAL_BASED_OUTPATIENT_CLINIC_OR_DEPARTMENT_OTHER): Payer: Medicaid Other | Admitting: Oncology

## 2018-10-13 ENCOUNTER — Inpatient Hospital Stay: Payer: Medicaid Other | Attending: Oncology

## 2018-10-13 ENCOUNTER — Encounter: Payer: Self-pay | Admitting: General Practice

## 2018-10-13 VITALS — BP 108/65 | HR 59 | Temp 98.0°F | Resp 18 | Ht 64.0 in | Wt 201.8 lb

## 2018-10-13 DIAGNOSIS — C773 Secondary and unspecified malignant neoplasm of axilla and upper limb lymph nodes: Secondary | ICD-10-CM

## 2018-10-13 DIAGNOSIS — F429 Obsessive-compulsive disorder, unspecified: Secondary | ICD-10-CM

## 2018-10-13 DIAGNOSIS — Z9013 Acquired absence of bilateral breasts and nipples: Secondary | ICD-10-CM

## 2018-10-13 DIAGNOSIS — D3502 Benign neoplasm of left adrenal gland: Secondary | ICD-10-CM

## 2018-10-13 DIAGNOSIS — J45901 Unspecified asthma with (acute) exacerbation: Secondary | ICD-10-CM

## 2018-10-13 DIAGNOSIS — C50411 Malignant neoplasm of upper-outer quadrant of right female breast: Secondary | ICD-10-CM

## 2018-10-13 DIAGNOSIS — F331 Major depressive disorder, recurrent, moderate: Secondary | ICD-10-CM

## 2018-10-13 DIAGNOSIS — Z1379 Encounter for other screening for genetic and chromosomal anomalies: Secondary | ICD-10-CM

## 2018-10-13 DIAGNOSIS — Z803 Family history of malignant neoplasm of breast: Secondary | ICD-10-CM

## 2018-10-13 DIAGNOSIS — Z171 Estrogen receptor negative status [ER-]: Secondary | ICD-10-CM

## 2018-10-13 DIAGNOSIS — Z5112 Encounter for antineoplastic immunotherapy: Secondary | ICD-10-CM | POA: Insufficient documentation

## 2018-10-13 DIAGNOSIS — Z87891 Personal history of nicotine dependence: Secondary | ICD-10-CM

## 2018-10-13 DIAGNOSIS — Z780 Asymptomatic menopausal state: Secondary | ICD-10-CM

## 2018-10-13 DIAGNOSIS — Z8041 Family history of malignant neoplasm of ovary: Secondary | ICD-10-CM

## 2018-10-13 DIAGNOSIS — Z95828 Presence of other vascular implants and grafts: Secondary | ICD-10-CM

## 2018-10-13 DIAGNOSIS — J441 Chronic obstructive pulmonary disease with (acute) exacerbation: Secondary | ICD-10-CM

## 2018-10-13 DIAGNOSIS — F4322 Adjustment disorder with anxiety: Secondary | ICD-10-CM

## 2018-10-13 LAB — CBC WITH DIFFERENTIAL/PLATELET
Abs Immature Granulocytes: 0.03 10*3/uL (ref 0.00–0.07)
Basophils Absolute: 0 10*3/uL (ref 0.0–0.1)
Basophils Relative: 0 %
Eosinophils Absolute: 0.2 10*3/uL (ref 0.0–0.5)
Eosinophils Relative: 3 %
HCT: 30.4 % — ABNORMAL LOW (ref 36.0–46.0)
Hemoglobin: 9.5 g/dL — ABNORMAL LOW (ref 12.0–15.0)
Immature Granulocytes: 0 %
Lymphocytes Relative: 24 %
Lymphs Abs: 1.6 10*3/uL (ref 0.7–4.0)
MCH: 29.1 pg (ref 26.0–34.0)
MCHC: 31.3 g/dL (ref 30.0–36.0)
MCV: 93.3 fL (ref 80.0–100.0)
Monocytes Absolute: 0.4 10*3/uL (ref 0.1–1.0)
Monocytes Relative: 6 %
NEUTROS PCT: 67 %
Neutro Abs: 4.5 10*3/uL (ref 1.7–7.7)
PLATELETS: 227 10*3/uL (ref 150–400)
RBC: 3.26 MIL/uL — ABNORMAL LOW (ref 3.87–5.11)
RDW: 16.2 % — ABNORMAL HIGH (ref 11.5–15.5)
WBC: 6.7 10*3/uL (ref 4.0–10.5)
nRBC: 0 % (ref 0.0–0.2)

## 2018-10-13 LAB — COMPREHENSIVE METABOLIC PANEL
ALT: 14 U/L (ref 0–44)
ANION GAP: 11 (ref 5–15)
AST: 14 U/L — ABNORMAL LOW (ref 15–41)
Albumin: 3.8 g/dL (ref 3.5–5.0)
Alkaline Phosphatase: 93 U/L (ref 38–126)
BUN: 27 mg/dL — ABNORMAL HIGH (ref 6–20)
CO2: 21 mmol/L — ABNORMAL LOW (ref 22–32)
Calcium: 9.2 mg/dL (ref 8.9–10.3)
Chloride: 108 mmol/L (ref 98–111)
Creatinine, Ser: 1.22 mg/dL — ABNORMAL HIGH (ref 0.44–1.00)
GFR calc non Af Amer: 49 mL/min — ABNORMAL LOW (ref >60)
GFR, EST AFRICAN AMERICAN: 57 mL/min — AB (ref >60)
Glucose, Bld: 93 mg/dL (ref 70–99)
Potassium: 4.4 mmol/L (ref 3.5–5.1)
Sodium: 140 mmol/L (ref 135–145)
Total Bilirubin: 0.3 mg/dL (ref 0.3–1.2)
Total Protein: 7.5 g/dL (ref 6.5–8.1)

## 2018-10-13 MED ORDER — ACETAMINOPHEN 325 MG PO TABS
ORAL_TABLET | ORAL | Status: AC
  Filled 2018-10-13: qty 2

## 2018-10-13 MED ORDER — DIPHENHYDRAMINE HCL 25 MG PO CAPS
25.0000 mg | ORAL_CAPSULE | Freq: Once | ORAL | Status: AC
  Administered 2018-10-13: 25 mg via ORAL

## 2018-10-13 MED ORDER — HEPARIN SOD (PORK) LOCK FLUSH 100 UNIT/ML IV SOLN
500.0000 [IU] | Freq: Once | INTRAVENOUS | Status: AC | PRN
  Administered 2018-10-13: 500 [IU]
  Filled 2018-10-13: qty 5

## 2018-10-13 MED ORDER — ACETAMINOPHEN 325 MG PO TABS
650.0000 mg | ORAL_TABLET | Freq: Once | ORAL | Status: AC
  Administered 2018-10-13: 650 mg via ORAL

## 2018-10-13 MED ORDER — TRASTUZUMAB CHEMO 150 MG IV SOLR
6.0000 mg/kg | Freq: Once | INTRAVENOUS | Status: AC
  Administered 2018-10-13: 588 mg via INTRAVENOUS
  Filled 2018-10-13: qty 28

## 2018-10-13 MED ORDER — SODIUM CHLORIDE 0.9% FLUSH
10.0000 mL | INTRAVENOUS | Status: DC | PRN
  Administered 2018-10-13: 10 mL
  Filled 2018-10-13: qty 10

## 2018-10-13 MED ORDER — DIPHENHYDRAMINE HCL 25 MG PO CAPS
ORAL_CAPSULE | ORAL | Status: AC
  Filled 2018-10-13: qty 1

## 2018-10-13 MED ORDER — SODIUM CHLORIDE 0.9 % IV SOLN
Freq: Once | INTRAVENOUS | Status: AC
  Administered 2018-10-13: 12:00:00 via INTRAVENOUS
  Filled 2018-10-13: qty 250

## 2018-10-13 MED ORDER — SODIUM CHLORIDE 0.9% FLUSH
10.0000 mL | Freq: Once | INTRAVENOUS | Status: AC
  Administered 2018-10-13: 10 mL
  Filled 2018-10-13: qty 10

## 2018-10-13 NOTE — Patient Instructions (Signed)
[facility] Discharge Instructions for Patients Receiving Chemotherapy  Today you received the following chemotherapy agents: Trastuzumab (Herceptin)  To help prevent nausea and vomiting after your treatment, we encourage you to take your nausea medication as directed.    If you develop nausea and vomiting that is not controlled by your nausea medication, call the clinic.   BELOW ARE SYMPTOMS THAT SHOULD BE REPORTED IMMEDIATELY:  *FEVER GREATER THAN 100.5 F  *CHILLS WITH OR WITHOUT FEVER  NAUSEA AND VOMITING THAT IS NOT CONTROLLED WITH YOUR NAUSEA MEDICATION  *UNUSUAL SHORTNESS OF BREATH  *UNUSUAL BRUISING OR BLEEDING  TENDERNESS IN MOUTH AND THROAT WITH OR WITHOUT PRESENCE OF ULCERS  *URINARY PROBLEMS  *BOWEL PROBLEMS  UNUSUAL RASH Items with * indicate a potential emergency and should be followed up as soon as possible.  Feel free to call the clinic should you have any questions or concerns. The clinic phone number is [phone].  Please show the CHEMO ALERT CARD at check-in to the Emergency Department and triage nurse.    

## 2018-10-13 NOTE — Progress Notes (Signed)
[facility] CSW Progress Notes  Medications management appointment arranged for patient at [facility] [id number], # [id number]) on Friday 3/6 at 10 AM (asked to arrive at 9:30 to complete paperwork) w Dr Bary Leriche.  Pt informed of appointment while in infusion, MD asked to send Epic referral.  Mailed information on community financial resources for patients diagnosed w breast cancer including [employer] [facility] [place], Dietitian and [employer].  Will check in w patient at next infusion.    Edwyna Shell, LCSW Clinical Social Worker Phone:  [phone]

## 2018-10-13 NOTE — Progress Notes (Signed)
Per Dr. Andrey Spearman to treat using echo from 06/25/18

## 2018-10-14 ENCOUNTER — Encounter: Payer: Self-pay | Admitting: Oncology

## 2018-10-14 ENCOUNTER — Telehealth: Payer: Self-pay

## 2018-10-14 NOTE — Telephone Encounter (Signed)
Call to pt to assess her status She reports that there is no drainage from her right breast- there is slight redness, but she denies any fever & no increased pain She saw her oncologist on 10/13/18- he confirmed that there was slight redness, agreed that the site looks as if it is healing well  pt is coming in for her f/u with Dr. Marla Roe on 10/15/18 @ 1pm

## 2018-10-15 ENCOUNTER — Encounter: Payer: Self-pay | Admitting: Plastic Surgery

## 2018-10-15 ENCOUNTER — Ambulatory Visit (INDEPENDENT_AMBULATORY_CARE_PROVIDER_SITE_OTHER): Payer: Medicaid Other | Admitting: Plastic Surgery

## 2018-10-15 ENCOUNTER — Telehealth: Payer: Self-pay | Admitting: Radiation Oncology

## 2018-10-15 ENCOUNTER — Ambulatory Visit (HOSPITAL_COMMUNITY): Payer: Self-pay | Admitting: Psychiatry

## 2018-10-15 VITALS — BP 112/74 | HR 66 | Temp 98.3°F | Ht 64.0 in | Wt 201.0 lb

## 2018-10-15 DIAGNOSIS — Z9011 Acquired absence of right breast and nipple: Secondary | ICD-10-CM

## 2018-10-15 NOTE — Progress Notes (Signed)
Psychiatric Initial Adult Assessment   Patient Identification: Jacqueline Good MRN:  [id number] Date of Evaluation:  10/16/2018 Referral Source: [name], [name], MD Chief Complaint:  "I'm here to just get the medication." Visit Diagnosis:    ICD-10-CM   1. MDD (major depressive disorder), recurrent episode, moderate (HCC) F33.1     History of Present Illness:   Jacqueline Good is a 58 y.o. year old female with a history of depression, OCD, estrogen negative T2 pN1, stage IIB invasive ductal carcinoma, diagnosed 02/25/2018 s/p mastectomy 08/2018, neoadjuvant chemo immunotherapy/on trastzumab, asthma, OA, chronic back pain, who is referred for depression. She used to be seen by Donnal Moat.  According to the chart review, she was found to have a possible abnormality in the right breast on routine screening mammography on  02/05/2018. She underwent neoadjuvant chemo immunotherapy, which includes carboplatin, docetaxel, trastzumab, and pertuzumab every 21 days with Granix support on day 3-6, completed 07/12/2018.  She will be trastzumab until August per chart review. "Ammarie continues to have some difficulties with reconstruction and this is delaying her adjuvant radiation.  I do think this needs to be started relatively soon and I have alerted Dr. Isidore Moos to this issue."  Patient states that "I'm here to just get the medication."  She reports strong disappointment of not being able to see Ms. Adelene Idler, her mental health provider due to insurance issues. She states that she does not want to see a therapist as she does not want to talk about thing and "I will just do it in my way." She states that she was seen by her provider yesterday for breast cancer, and was told that her body is rejecting the expander, and she may need to have another surgery. She tries to "live each day"and does not make any future plans. She agrees that she has fear about her medication condition. She hopes to "cope" so that she can  "get my life back." She reports good support from her mother, who lives next door.   She has occasional middle insomnia.  She feels fatigue and has significant anhedonia. She has significant difficulty in concentration; unable to read books or computer anymore.  She believes it has been going on even before she underwent chemotherapy.  She denies SI.  She feels anxious and tense.  She has constant random racing thoughts.  She has had panic attacks at least a few times, although it is not frequent.  She feels irritable and isolative. She denies alcohol use or drug use.   PTSD- trauma history below. She believes that intrusive thoughts has been worsening lately.  Ambien,, xanax filled on 09/25/2017  Associated Signs/Symptoms: Depression Symptoms:  depressed mood, anhedonia, insomnia, fatigue, impaired memory, anxiety, panic attacks, (Hypo) Manic Symptoms:  denies decreased need for sleep, euphoria Anxiety Symptoms:  Excessive Worry, Panic Symptoms, Psychotic Symptoms:  denies Ah, VH, paranoia PTSD Symptoms: Had a traumatic exposure:  "hostile environment" at previous work; patient declined to talk in details Re-experiencing:  Flashbacks Intrusive Thoughts Hypervigilance:  Yes Hyperarousal:  Irritability/Anger Avoidance:  Decreased Interest/Participation  Past Psychiatric History:  Outpatient: depression for many years Psychiatry admission: denies Previous suicide attempt: denies Past trials of medication: sertraline, fluoxetine, lexapro, venlafaxine, duloxetine, bupropion, quetiapine History of violence: denies   Previous Psychotropic Medications: Yes   Substance Abuse History in the last 12 months:  No.  Consequences of Substance Abuse: NA  Past Medical History:  Past Medical History:  Diagnosis Date  . Anemia   .  Anxiety   . Arthritis    "all over" (08/12/2018)  . Asthma   . Breast cancer, right breast ([facility]) dx'd 02/2018  . Chronic bronchitis ([facility])   . Depression   .  Family history of breast cancer   . Family history of prostate cancer   . Family history of uterine cancer   . GERD (gastroesophageal reflux disease)   . History of blood transfusion    "several; related to low blood" (08/12/2018)    Past Surgical History:  Procedure Laterality Date  . AXILLARY LYMPH NODE DISSECTION Right 08/12/2018   Procedure: AXILLARY LYMPH NODE DISSECTION;  Surgeon: Alphonsa Overall, MD;  Location: [facility];  Service: General;  Laterality: Right;  . BREAST BIOPSY Right 03/2018  . BREAST RECONSTRUCTION WITH PLACEMENT OF TISSUE EXPANDER AND FLEX HD (ACELLULAR HYDRATED DERMIS) Right 08/12/2018   Procedure: RIGHT BREAST RECONSTRUCTION WITH PLACEMENT OF TISSUE EXPANDER AND FLEX HD (ACELLULAR HYDRATED DERMIS);  Surgeon: Wallace Going, DO;  Location: [facility];  Service: Plastics;  Laterality: Right;  . DILATION AND CURETTAGE OF UTERUS    . ENDOMETRIAL ABLATION    . IR IMAGING GUIDED PORT INSERTION  03/18/2018  . MASTECTOMY MODIFIED RADICAL Right 08/12/2018   w/axillary LND  . MASTECTOMY MODIFIED RADICAL Right 08/12/2018   Procedure: RIGHT MODIFIED RADICAL MASTECTOMY;  Surgeon: Alphonsa Overall, MD;  Location: [facility];  Service: General;  Laterality: Right;  . MYOMECTOMY      Family Psychiatric History:  Cousin- schizophrenia/bipolar disorder  Family History:  Family History  Problem Relation Age of Onset  . Lupus Sister   . Heart disease Sister   . Breast cancer Mother 20  . Prostate cancer Paternal Uncle   . Diabetes Maternal Grandmother   . Heart Problems Maternal Grandmother   . Prostate cancer Maternal Grandfather   . Prostate cancer Paternal Grandfather   . Prostate cancer Other        MGFs brother  . Breast cancer Other        Mat great-grandmother's sister  . Uterine cancer Maternal Great-grandmother   . Prostate cancer Other        PGFs brother    Social History:   Social History   Socioeconomic History  . Marital status: Single    Spouse name: Not on file  .  Number of children: Not on file  . Years of education: Not on file  . Highest education level: Not on file  Occupational History  . Not on file  Social Needs  . Financial resource strain: Not on file  . Food insecurity:    Worry: Not on file    Inability: Not on file  . Transportation needs:    Medical: No    Non-medical: No  Tobacco Use  . Smoking status: Former Smoker    Packs/day: 1.00    Years: 38.00    Pack years: 38.00    Types: Cigarettes    Last attempt to quit: 05/15/2018    Years since quitting: 0.4  . Smokeless tobacco: Never Used  Substance and Sexual Activity  . Alcohol use: Not Currently    Comment: 08/12/2018 "nothing since 04/2018"  . Drug use: No  . Sexual activity: Not Currently    Birth control/protection: None  Lifestyle  . Physical activity:    Days per week: Not on file    Minutes per session: Not on file  . Stress: Not on file  Relationships  . Social connections:    Talks on phone:  Not on file    Gets together: Not on file    Attends religious service: Not on file    Active member of club or organization: Not on file    Attends meetings of clubs or organizations: Not on file    Relationship status: Not on file  Other Topics Concern  . Not on file  Social History Narrative  . Not on file    Additional Social History:  She lives by herself. Her mother lives next door Never married. She has one daughter, who lives in [place].  She has estranged relationship with the father of her daughter.  She grew up in [place].  Her parents got divorced when she was 1-year-old.  Although she did not like them arguing with each other, she reports good relationship with her mother, who raised the patient and other siblings.  Work: used to work as Research scientist (physical sciences), last in 2017; although the patient was terminated, it was a good timing for the patient as she was taking care of her cousin, who later moved into nursing home.   Allergies:   Allergies  Allergen Reactions   . Gadolinium Derivatives Itching and Cough    Pt began sneezing and coughing as soon as MRI contrast was injected. Severe nasal congestion. No rash or hives no SOB.   Marland Kitchen Hydrocodone Hives and Itching    Pt says she can take it with benadryl.   . Iodinated Diagnostic Agents Itching    Metabolic Disorder Labs: Lab Results  Component Value Date   HGBA1C 5.4 04/27/2014   No results found for: PROLACTIN Lab Results  Component Value Date   CHOL 159 09/24/2016   TRIG 179 (H) 09/24/2016   HDL 47 (L) 09/24/2016   CHOLHDL 3.4 09/24/2016   VLDL 36 (H) 09/24/2016   LDLCALC 76 09/24/2016   LDLCALC 86 04/27/2014   Lab Results  Component Value Date   TSH 0.58 09/24/2016    Therapeutic Level Labs: No results found for: LITHIUM No results found for: CBMZ No results found for: VALPROATE  Current Medications: Current Outpatient Medications  Medication Sig Dispense Refill  . albuterol (VENTOLIN HFA) 108 (90 Base) MCG/ACT inhaler INHALE 2 PUFFS INTO THE LUNGS EVERY 6 HOURS AS NEEDED FOR WHEEZING (Patient taking differently: Inhale 2 puffs into the lungs every 6 (six) hours as needed for wheezing or shortness of breath. ) 54 g 3  . ALPRAZolam (XANAX) 1 MG tablet 1 po q 6 hr prn.  No more than 3.5 qd. 105 tablet 3  . busPIRone (BUSPAR) 30 MG tablet Take 1 tablet (30 mg total) by mouth 2 (two) times daily. 60 tablet 3  . dexamethasone (DECADRON) 4 MG tablet Take 2 tablets (8 mg total) by mouth 2 (two) times daily. Start the day before Taxotere. Take once the day after, then 2 times a day x 2d. 30 tablet 1  . diphenoxylate-atropine (LOMOTIL) 2.5-0.025 MG tablet Take 1 tablet by mouth 4 (four) times daily as needed for diarrhea or loose stools. 30 tablet 0  . fluticasone (FLONASE) 50 MCG/ACT nasal spray Place 2 sprays into both nostrils daily. 1 g 0  . Fluticasone-Salmeterol (ADVAIR DISKUS) 250-50 MCG/DOSE AEPB INHALE 1 PUFF INTO THE LUNGS 2 TIMES DAILY. (Patient taking differently: Inhale 1 puff  into the lungs 2 (two) times daily as needed (wheezing or shortness of breath). ) 60 each 2  . lidocaine-prilocaine (EMLA) cream Apply to affected area once 30 g 3  . loperamide (IMODIUM) 2 MG capsule  Take 2 mg by mouth as needed for diarrhea or loose stools.    Marland Kitchen loratadine (CLARITIN) 10 MG tablet Take 10 mg by mouth daily as needed for allergies.    Marland Kitchen omeprazole (PRILOSEC) 40 MG capsule Take 1 capsule (40 mg total) by mouth daily. 30 capsule 2  . propranolol (INDERAL) 40 MG tablet Take 1 tablet by mouth daily.    Marland Kitchen venlafaxine (EFFEXOR) 75 MG tablet Take 3 tablets (225 mg total) by mouth daily. Take 2 qd until 08/07/18 and then start 3 po qd. 90 tablet 3  . zolpidem (AMBIEN) 10 MG tablet Take 1 tablet (10 mg total) by mouth at bedtime as needed for sleep (1/2-1 tab). 30 tablet 3  . ARIPiprazole (ABILIFY) 2 MG tablet Take 1 tablet (2 mg total) by mouth daily. 30 tablet 1   No current facility-administered medications for this visit.     Musculoskeletal: Strength & Muscle Tone: within normal limits Gait & Station: normal Patient leans: N/A  Psychiatric Specialty Exam: Review of Systems  Psychiatric/Behavioral: Positive for depression and memory loss. Negative for hallucinations, substance abuse and suicidal ideas. The patient is nervous/anxious and has insomnia.   All other systems reviewed and are negative.   Blood pressure 130/74, pulse 68, height 5\' 5"  (1.651 m), weight 200 lb (90.7 kg).Body mass index is 33.28 kg/m.  General Appearance: Fairly Groomed  Eye Contact:  Good  Speech:  Clear and Coherent  Volume:  Normal  Mood:  Depressed  Affect:  Appropriate, Congruent, Restricted and Tearful  Thought Process:  Coherent  Orientation:  Full (Time, Place, and Person)  Thought Content:  Logical  Suicidal Thoughts:  No  Homicidal Thoughts:  No  Memory:  Immediate;   Good  Judgement:  Good  Insight:  Good  Psychomotor Activity:  Normal  Concentration:  Concentration: Good and  Attention Span: Good  Recall:  Good  Fund of Knowledge:Good  Language: Good  Akathisia:  No  Handed:  Right  AIMS (if indicated):  not done  Assets:  Communication Skills Desire for Improvement  ADL's:  Intact  Cognition: WNL  Sleep:  Poor   Screenings: GAD-7     Office Visit from 04/22/2018 in [facility] Office Visit from 06/03/2017 in [facility] Office Visit from 09/24/2016 in [facility] Office Visit from 03/27/2016 in [facility]  Total GAD-7 Score  11  3  15  16     PHQ2-9     Office Visit from 04/22/2018 in [facility] Office Visit from 06/03/2017 in [facility] Office Visit from 09/24/2016 in [facility] Office Visit from 05/01/2016 in [facility] Office Visit from 03/27/2016 in [facility]  PHQ-2 Total Score  5  0  2  5  2   PHQ-9 Total Score  15  4  7  8  10       Assessment and Plan:  ZIENNA AHLIN is a 58 y.o. year old female with a history of depression, OCD, estrogen negative T2 pN1, stage IIB invasive ductal carcinoma, diagnosed 02/25/2018 s/p mastectomy 08/2018, neoadjuvant chemo immunotherapy/on trastzumab, asthma, OA, chronic back pain, who is referred for depression. She is transferred from [facility] due to the provide unable to accept her insurance.   # MDD, moderate, recurrent without psychotic features # r/o  PTSD Patient reports worsening in depressive symptoms and anxiety in the context of medical condition of breast cancer, and unemployment.  She also reports some traumatic history at previous work, although she declined to talk in details on today's evaluation.  Will start Abilify as adjunctive treatment for depression.  Discussed potential metabolic side effect and EPS.  Will continue venlafaxine at  this time for depression.  Will taper off BuSpar given patient reports limited benefit from the medication.  Will continue Xanax as needed for anxiety; discussed potential risk of dependence, tolerance and oversedation.  She agrees to slowly taper off in the future to avoid side effect.  Will continue Ambien as needed for insomnia.  Discussed risk of oversedation.  Although the patient will greatly benefit from CBT, she reports no interest in therapy.  We will continue to discuss as needed.   # r/o cognitive impairment Patient reports worsening in concentration and attention, which she has started before being diagnosed of breast cancer.  It is likely multifactorial given her active mood symptoms, and her treatment history of chemotherapy with carboplatin, docetaxel, Granix, which have neurotoxic side effect.  Will continue to assess.   Plan 1. Continue venlafaxine 225 mg daily 2. Start Abilify 2 mg at night  3. Continue Xanax 1 mg three times a day (and additional 0.5 mg daily)as needed for anxiety  4. Continue Ambien 10 mg at night as needed for insomnia 6. Decrease Buspar 15 mg twice a day for one week, then discontinue 5. Return to clinic in six weeks for 30 mins   The patient demonstrates the following risk factors for suicide: Chronic risk factors for suicide include: psychiatric disorder of depression and medical illness of breast cancer. Acute risk factors for suicide include: unemployment and loss (financial, interpersonal, professional). Protective factors for this patient include: positive social support, responsibility to others (children, family), coping skills and hope for the future. Considering these factors, the overall suicide risk at this point appears to be low. Patient is appropriate for outpatient follow up.    Norman Clay, MD 3/7/202010:50 AM

## 2018-10-15 NOTE — Progress Notes (Signed)
   Subjective:    Patient ID: Jacqueline Good, female    DOB: 1961/02/15, 58 y.o.   MRN: [id number]  The patient is a 58 year old female here for follow-up on her right breast surgery.  She is feeling better and the breast swelling has improved.  She had no more drainage until today when there was a little bit of drainage on the gauze.  I am not able to get any out with pressure we spoke again about the plan and the concern about delaying her radiation.  I think that the combination of her overall status and chemotherapy has slowed her healing.    Review of Systems  Constitutional: Negative.  Negative for activity change and appetite change.  HENT: Negative.   Eyes: Negative.   Respiratory: Negative.   Cardiovascular: Negative.   Gastrointestinal: Negative.   Musculoskeletal: Negative.   Skin: Positive for wound.  Psychiatric/Behavioral: Negative.        Objective:   Physical Exam Vitals signs and nursing note reviewed.  Constitutional:      Appearance: Normal appearance.  HENT:     Head: Atraumatic.     Nose: Nose normal.     Mouth/Throat:     Mouth: Mucous membranes are moist.  Eyes:     Extraocular Movements: Extraocular movements intact.  Cardiovascular:     Rate and Rhythm: Normal rate.  Pulmonary:     Effort: Pulmonary effort is normal.  Chest:    Neurological:     General: No focal deficit present.     Mental Status: She is alert.  Psychiatric:        Mood and Affect: Mood normal.        Thought Content: Thought content normal.        Judgment: Judgment normal.       Assessment & Plan:  S/P mastectomy, right   We removed 50 cc from the right expander.  She now has 240 cc in the right expander.  At this point we agreed to hold on any more filling.  I will reach out to radiation oncology and oncology and the patient is free to move ahead with radiation.  We will continue expansion and reconstruction once she has finished with the radiation.  That will most  likely need a latissimus muscle flap.

## 2018-10-15 NOTE — Telephone Encounter (Signed)
New Message:    Called pt to schedule appt from referral and pt ask that I call her back on Monday 10/18/18 to give her time to look over her calendar, since she has so many appts to see when the best time would be for her.

## 2018-10-16 ENCOUNTER — Ambulatory Visit (INDEPENDENT_AMBULATORY_CARE_PROVIDER_SITE_OTHER): Payer: Medicaid Other | Admitting: Psychiatry

## 2018-10-16 ENCOUNTER — Encounter (HOSPITAL_COMMUNITY): Payer: Self-pay | Admitting: Psychiatry

## 2018-10-16 VITALS — BP 130/74 | HR 68 | Ht 65.0 in | Wt 200.0 lb

## 2018-10-16 DIAGNOSIS — F331 Major depressive disorder, recurrent, moderate: Secondary | ICD-10-CM | POA: Diagnosis not present

## 2018-10-16 MED ORDER — ARIPIPRAZOLE 2 MG PO TABS: 2.0000 mg | ORAL_TABLET | Freq: Every day | ORAL | 1 refills | Status: DC

## 2018-10-16 NOTE — Patient Instructions (Addendum)
1. Continue venlafaxine 225 mg daily 2. Start Abilify 2 mg at night  3. Continue Xanax 1 mg three times a day (and additional 0.5 mg daily )as needed for anxiety  4. Continue Ambien 10 mg at night as needed for insomnia 5. Return to clinic in six weeks for 30 mins 6. Decrease buspar 15 mg twice a day for one week, then discontinue

## 2018-10-18 MED FILL — ARIPIPRAZOLE 2 MG TABS: 2 | 30 days supply | Qty: 30 | Fill #0

## 2018-10-19 ENCOUNTER — Telehealth: Payer: Self-pay

## 2018-10-19 ENCOUNTER — Encounter: Payer: Self-pay | Admitting: Plastic Surgery

## 2018-10-19 ENCOUNTER — Ambulatory Visit (INDEPENDENT_AMBULATORY_CARE_PROVIDER_SITE_OTHER): Payer: Medicaid Other | Admitting: Plastic Surgery

## 2018-10-19 VITALS — HR 76 | Temp 98.7°F | Ht 65.0 in | Wt 200.0 lb

## 2018-10-19 DIAGNOSIS — Z9011 Acquired absence of right breast and nipple: Secondary | ICD-10-CM

## 2018-10-19 MED ORDER — DOXYCYCLINE HYCLATE 100 MG PO TABS: 100.0000 mg | ORAL_TABLET | Freq: Two times a day (BID) | ORAL | 0 refills | Status: DC

## 2018-10-19 MED FILL — DOXYCYCLINE HYCLATE 100 MG: 100 | 10 days supply | Qty: 20 | Fill #0

## 2018-10-19 NOTE — H&P (View-Only) (Signed)
   Subjective:    Patient ID: Jacqueline Good, female    DOB: 09/03/1960, 58 y.o.   MRN: [id number]  The patient is a 58 year old female here for evaluation of her right breast.  She has had some intermittent drainage from the incision that is on the medial aspect of the right mastectomy incision.  She has been resistant to admission or antibiotics.  She is now concerned about the amount of drainage.  I took out some fluid from the expander at the last visit.  I think that she is filling up with fluid and this is a part of what is draining out.  There is a little bit of redness as well in the lower medial flap.   Review of Systems  Constitutional: Negative.  Negative for activity change.  HENT: Negative.   Respiratory: Negative.   Cardiovascular: Negative.   Gastrointestinal: Negative.   Genitourinary: Negative.   Musculoskeletal: Negative.   Psychiatric/Behavioral: Negative.        Objective:   Physical Exam Vitals signs and nursing note reviewed.  Constitutional:      Appearance: Normal appearance.  HENT:     Head: Normocephalic and atraumatic.  Cardiovascular:     Rate and Rhythm: Normal rate.  Pulmonary:     Effort: Pulmonary effort is normal.  Chest:    Neurological:     Mental Status: She is alert.  Psychiatric:        Mood and Affect: Mood normal.        Behavior: Behavior normal.        Thought Content: Thought content normal.        Assessment & Plan:  Acquired absence of right breast  S/P mastectomy, right At this point I recommend removal of the expander and antibiotic treatment. I will make arrangements.  I have expressed my concern to the patient.  When this occurs it is most often related to the chemo.  The safest is to remove the expander so she can proceed with treatment.  Antibiotic sent to pharm.   Message sent to Dr. Isidore Moos.

## 2018-10-19 NOTE — Progress Notes (Addendum)
   Subjective:    Patient ID: Jacqueline Good, female    DOB: 1960-10-16, 58 y.o.   MRN: [id number]  The patient is a 58 year old female here for evaluation of her right breast.  She has had some intermittent drainage from the incision that is on the medial aspect of the right mastectomy incision.  She has been resistant to admission or antibiotics.  She is now concerned about the amount of drainage.  I took out some fluid from the expander at the last visit.  I think that she is filling up with fluid and this is a part of what is draining out.  There is a little bit of redness as well in the lower medial flap.   Review of Systems  Constitutional: Negative.  Negative for activity change.  HENT: Negative.   Respiratory: Negative.   Cardiovascular: Negative.   Gastrointestinal: Negative.   Genitourinary: Negative.   Musculoskeletal: Negative.   Psychiatric/Behavioral: Negative.        Objective:   Physical Exam Vitals signs and nursing note reviewed.  Constitutional:      Appearance: Normal appearance.  HENT:     Head: Normocephalic and atraumatic.  Cardiovascular:     Rate and Rhythm: Normal rate.  Pulmonary:     Effort: Pulmonary effort is normal.  Chest:    Neurological:     Mental Status: She is alert.  Psychiatric:        Mood and Affect: Mood normal.        Behavior: Behavior normal.        Thought Content: Thought content normal.        Assessment & Plan:  Acquired absence of right breast  S/P mastectomy, right At this point I recommend removal of the expander and antibiotic treatment. I will make arrangements.  I have expressed my concern to the patient.  When this occurs it is most often related to the chemo.  The safest is to remove the expander so she can proceed with treatment.  Antibiotic sent to pharm.   Message sent to Dr. Isidore Moos.

## 2018-10-19 NOTE — Addendum Note (Signed)
Addended by: Wallace Going on: 10/19/2018 01:23 PM   Modules accepted: Orders

## 2018-10-19 NOTE — Telephone Encounter (Signed)
Call from pt-c/o bleeding from incision- X2 bandages  ( gauze & tape)this am I instructed her to reinforce dressing & add compression- bra or otherwise- then she will come in today for evaluation  Pt agrees with plan of care  [place], RNFA

## 2018-10-20 ENCOUNTER — Other Ambulatory Visit (HOSPITAL_COMMUNITY): Payer: Self-pay

## 2018-10-22 ENCOUNTER — Encounter (HOSPITAL_COMMUNITY)
Admission: RE | Admit: 2018-10-22 | Discharge: 2018-10-22 | Disposition: A | Discharge status: Home / Self Care | Payer: Medicaid Other | Source: Ambulatory Visit | Attending: Plastic Surgery | Admitting: Plastic Surgery

## 2018-10-22 ENCOUNTER — Encounter (HOSPITAL_COMMUNITY): Payer: Self-pay

## 2018-10-22 ENCOUNTER — Other Ambulatory Visit: Payer: Self-pay

## 2018-10-22 DIAGNOSIS — Z01818 Encounter for other preprocedural examination: Secondary | ICD-10-CM | POA: Insufficient documentation

## 2018-10-22 NOTE — Pre-Procedure Instructions (Signed)
ELEXA KIVI  10/22/2018      [place], [place] [address] Phone: [phone] Fax: [phone]    Your procedure is scheduled on  Tuesday 10/26/18  Report to [facility]  at 0530 A.M.  Call this number if you have problems the morning of surgery:  [phone]   Remember:  Do not eat or drink after midnight.    Take these medicines the morning of surgery with A SIP OF WATER             BusPIRone (BUSPAR)                                  Venlafaxine (EFFEXOR)                                                                 As needed :Albuterol (VENTOLIN HFA), ALPRAZolam Duanne Moron), Diphenoxylate-atropine (LOMOTIL),Loratadine (CLARITIN),Propanolol (Inderal),Dexamethasone (DECADRON ,Fluticasone (FLONASE)             Fluticasone-Salmeterol (ADVAIR DISKUS).                Bring all inhalers with you on day of surgery.  As of today, STOP taking any Aspirin (unless otherwise instructed by your surgeon), Aleve, Naproxen, Ibuprofen, Motrin, Advil, Goody's, BC's, all herbal medications, fish oil, and all vitamins.    Do not wear jewelry, make-up or nail polish.  Do not wear lotions, powders, or perfumes, or deodorant.  Do not shave 48 hours prior to surgery.    Do not bring valuables to the hospital.  [facility] is not responsible for any belongings or valuables.  Contacts, dentures or bridgework may not be worn into surgery.  Leave your suitcase in the car.  After surgery it may be brought to your room.  For patients admitted to the hospital, discharge time will be determined by your treatment team.  Patients discharged the day of surgery will not be allowed to drive home.   Special instructions:   [facility]- Preparing For Surgery  Before surgery, you can play an important role. Because skin is not sterile, your skin needs to be as free of germs as possible. You can  reduce the number of germs on your skin by washing with CHG (chlorahexidine gluconate) Soap before surgery.  CHG is an antiseptic cleaner which kills germs and bonds with the skin to continue killing germs even after washing.    Oral Hygiene is also important to reduce your risk of infection.  Remember - BRUSH YOUR TEETH THE MORNING OF SURGERY WITH YOUR REGULAR TOOTHPASTE  Please do not use if you have an allergy to CHG or antibacterial soaps. If your skin becomes reddened/irritated stop using the CHG.  Do not shave (including legs and underarms) for at least 48 hours prior to first CHG shower. It is OK to shave your face.  Please follow these instructions carefully.   1. Shower the NIGHT BEFORE SURGERY and the MORNING OF SURGERY with CHG.   2. If you chose to wash your hair, wash your hair first as usual with your normal shampoo.  3. After you  shampoo, rinse your hair and body thoroughly to remove the shampoo.  4. Use CHG as you would any other liquid soap. You can apply CHG directly to the skin and wash gently with a scrungie or a clean washcloth.   5. Apply the CHG Soap to your body ONLY FROM THE NECK DOWN.  Do not use on open wounds or open sores. Avoid contact with your eyes, ears, mouth and genitals (private parts). Wash Face and genitals (private parts)  with your normal soap.  6. Wash thoroughly, paying special attention to the area where your surgery will be performed.  7. Thoroughly rinse your body with warm water from the neck down.  8. DO NOT shower/wash with your normal soap after using and rinsing off the CHG Soap.  9. Pat yourself dry with a CLEAN TOWEL.  10. Wear CLEAN PAJAMAS to bed the night before surgery, wear comfortable clothes the morning of surgery  11. Place CLEAN SHEETS on your bed the night of your first shower and DO NOT SLEEP WITH PETS.  Day of Surgery:  Do not apply any deodorants/lotions.  Please wear clean clothes to the hospital/surgery center.    Remember to brush your teeth WITH YOUR REGULAR TOOTHPASTE.  Please read over the following fact sheets that you were given. Pain Booklet, Coughing and Deep Breathing and Surgical Site Infection Prevention

## 2018-10-22 NOTE — Progress Notes (Addendum)
PCP - Dr. Etheleen Mayhew  Cardiologist - Denies  Chest x-ray - Denies  EKG - Denies  Stress Test - Denies  ECHO - 06/25/18 (E) 10/25/18-scheduled  Cardiac Cath - Denies  AICD-na PM-na LOOP-na  Sleep Study - Denies CPAP - None  LABS- 10/13/2018: CBC w/D, CMP (E)  ASA-Denies   Anesthesia- Yes- previous consult for low H & H  Pt denies having chest pain, sob, or fever at this time. All instructions explained to the pt, with a verbal understanding of the material. Pt agrees to go over the instructions while at home for a better understanding. The opportunity to ask questions was provided.

## 2018-10-25 ENCOUNTER — Other Ambulatory Visit: Payer: Self-pay | Admitting: Oncology

## 2018-10-25 ENCOUNTER — Ambulatory Visit (HOSPITAL_COMMUNITY): Payer: Medicaid Other | Attending: Cardiology

## 2018-10-25 ENCOUNTER — Other Ambulatory Visit: Payer: Self-pay

## 2018-10-25 DIAGNOSIS — Z171 Estrogen receptor negative status [ER-]: Secondary | ICD-10-CM | POA: Diagnosis present

## 2018-10-25 DIAGNOSIS — C50411 Malignant neoplasm of upper-outer quadrant of right female breast: Secondary | ICD-10-CM | POA: Diagnosis present

## 2018-10-25 NOTE — Progress Notes (Signed)
I called the patient and let her know her echo showed a slightly decreased ejection fraction.  We can hold off Herceptin for the next 2 to 3 months until that resolves.  In the meantime she will be receiving radiation and completing her surgery.

## 2018-10-26 ENCOUNTER — Ambulatory Visit (HOSPITAL_COMMUNITY): Payer: Medicaid Other | Admitting: Psychiatry

## 2018-10-26 ENCOUNTER — Encounter (HOSPITAL_COMMUNITY)
Admission: RE | Disposition: A | Discharge status: Home / Self Care | Payer: Self-pay | Source: Ambulatory Visit | Attending: Plastic Surgery

## 2018-10-26 ENCOUNTER — Encounter (HOSPITAL_COMMUNITY): Payer: Self-pay

## 2018-10-26 ENCOUNTER — Ambulatory Visit (HOSPITAL_COMMUNITY)
Admission: RE | Admit: 2018-10-26 | Discharge: 2018-10-26 | Disposition: A | Discharge status: Home / Self Care | Payer: Medicaid Other | Source: Ambulatory Visit | Attending: Plastic Surgery | Admitting: Plastic Surgery

## 2018-10-26 ENCOUNTER — Other Ambulatory Visit: Payer: Self-pay

## 2018-10-26 ENCOUNTER — Ambulatory Visit (HOSPITAL_COMMUNITY): Payer: Medicaid Other | Admitting: Anesthesiology

## 2018-10-26 ENCOUNTER — Ambulatory Visit (HOSPITAL_COMMUNITY): Payer: Medicaid Other | Admitting: Physician Assistant

## 2018-10-26 DIAGNOSIS — Z45811 Encounter for adjustment or removal of right breast implant: Secondary | ICD-10-CM | POA: Insufficient documentation

## 2018-10-26 DIAGNOSIS — Z87891 Personal history of nicotine dependence: Secondary | ICD-10-CM | POA: Insufficient documentation

## 2018-10-26 DIAGNOSIS — K219 Gastro-esophageal reflux disease without esophagitis: Secondary | ICD-10-CM | POA: Diagnosis not present

## 2018-10-26 DIAGNOSIS — Z853 Personal history of malignant neoplasm of breast: Secondary | ICD-10-CM | POA: Diagnosis not present

## 2018-10-26 DIAGNOSIS — Z9011 Acquired absence of right breast and nipple: Secondary | ICD-10-CM | POA: Insufficient documentation

## 2018-10-26 DIAGNOSIS — J449 Chronic obstructive pulmonary disease, unspecified: Secondary | ICD-10-CM | POA: Diagnosis not present

## 2018-10-26 DIAGNOSIS — L905 Scar conditions and fibrosis of skin: Secondary | ICD-10-CM | POA: Insufficient documentation

## 2018-10-26 HISTORY — PX: REMOVAL OF TISSUE EXPANDER AND PLACEMENT OF IMPLANT: SHX6457

## 2018-10-26 SURGERY — REMOVAL, TISSUE EXPANDER, BREAST, WITH IMPLANT INSERTION
Anesthesia: General | Site: Breast | Laterality: Right

## 2018-10-26 MED ORDER — SODIUM CHLORIDE 0.9% FLUSH: 3.0000 mL | Freq: Two times a day (BID) | INTRAVENOUS | Status: DC

## 2018-10-26 MED ORDER — SODIUM CHLORIDE 0.9 % IV SOLN
INTRAVENOUS | Status: DC | PRN
  Administered 2018-10-26: 500 mL

## 2018-10-26 MED ORDER — GLYCOPYRROLATE PF 0.2 MG/ML IJ SOSY
PREFILLED_SYRINGE | INTRAMUSCULAR | Status: AC
  Filled 2018-10-26: qty 1

## 2018-10-26 MED ORDER — LIDOCAINE 2% (20 MG/ML) 5 ML SYRINGE
INTRAMUSCULAR | Status: DC | PRN
  Administered 2018-10-26: 8 mg via INTRAVENOUS

## 2018-10-26 MED ORDER — LIDOCAINE 2% (20 MG/ML) 5 ML SYRINGE
INTRAMUSCULAR | Status: AC
  Filled 2018-10-26: qty 5

## 2018-10-26 MED ORDER — PHENYLEPHRINE 40 MCG/ML (10ML) SYRINGE FOR IV PUSH (FOR BLOOD PRESSURE SUPPORT)
PREFILLED_SYRINGE | INTRAVENOUS | Status: AC
  Filled 2018-10-26: qty 10

## 2018-10-26 MED ORDER — CHLORHEXIDINE GLUCONATE CLOTH 2 % EX PADS: 6.0000 | MEDICATED_PAD | Freq: Once | CUTANEOUS | Status: DC

## 2018-10-26 MED ORDER — ACETAMINOPHEN 325 MG PO TABS: 650.0000 mg | ORAL_TABLET | ORAL | Status: DC | PRN

## 2018-10-26 MED ORDER — MEPERIDINE HCL 50 MG/ML IJ SOLN: 6.2500 mg | INTRAMUSCULAR | Status: DC | PRN

## 2018-10-26 MED ORDER — LIDOCAINE-EPINEPHRINE 1 %-1:100000 IJ SOLN
INTRAMUSCULAR | Status: DC | PRN
  Administered 2018-10-26: 10 mL

## 2018-10-26 MED ORDER — CEFAZOLIN SODIUM-DEXTROSE 2-4 GM/100ML-% IV SOLN
2.0000 g | INTRAVENOUS | Status: AC
  Administered 2018-10-26: 2 g via INTRAVENOUS

## 2018-10-26 MED ORDER — SODIUM CHLORIDE 0.9% FLUSH: 3.0000 mL | INTRAVENOUS | Status: DC | PRN

## 2018-10-26 MED ORDER — EPHEDRINE SULFATE-NACL 50-0.9 MG/10ML-% IV SOSY
PREFILLED_SYRINGE | INTRAVENOUS | Status: DC | PRN
  Administered 2018-10-26 (×5): 10 mg via INTRAVENOUS

## 2018-10-26 MED ORDER — GLYCOPYRROLATE PF 0.2 MG/ML IJ SOSY
PREFILLED_SYRINGE | INTRAMUSCULAR | Status: DC | PRN
  Administered 2018-10-26: .2 mg via INTRAVENOUS

## 2018-10-26 MED ORDER — SODIUM CHLORIDE 0.9 % IV SOLN
INTRAVENOUS | Status: AC
  Filled 2018-10-26: qty 500000

## 2018-10-26 MED ORDER — ONDANSETRON HCL 4 MG/2ML IJ SOLN
INTRAMUSCULAR | Status: AC
  Filled 2018-10-26: qty 2

## 2018-10-26 MED ORDER — OXYCODONE HCL 5 MG PO TABS: 5.0000 mg | ORAL_TABLET | Freq: Once | ORAL | Status: DC | PRN

## 2018-10-26 MED ORDER — LIDOCAINE-EPINEPHRINE 1 %-1:100000 IJ SOLN
INTRAMUSCULAR | Status: AC
  Filled 2018-10-26: qty 1

## 2018-10-26 MED ORDER — ACETAMINOPHEN 650 MG RE SUPP: 650.0000 mg | RECTAL | Status: DC | PRN

## 2018-10-26 MED ORDER — DEXAMETHASONE SODIUM PHOSPHATE 10 MG/ML IJ SOLN
INTRAMUSCULAR | Status: DC | PRN
  Administered 2018-10-26: 4 mg via INTRAVENOUS

## 2018-10-26 MED ORDER — SODIUM CHLORIDE 0.9 % IV SOLN: 250.0000 mL | INTRAVENOUS | Status: DC | PRN

## 2018-10-26 MED ORDER — FENTANYL CITRATE (PF) 250 MCG/5ML IJ SOLN
INTRAMUSCULAR | Status: AC
  Filled 2018-10-26: qty 5

## 2018-10-26 MED ORDER — LACTATED RINGERS IV SOLN
INTRAVENOUS | Status: DC | PRN
  Administered 2018-10-26: 07:00:00 via INTRAVENOUS

## 2018-10-26 MED ORDER — HYDROMORPHONE HCL 1 MG/ML IJ SOLN: 0.2500 mg | INTRAMUSCULAR | Status: DC | PRN

## 2018-10-26 MED ORDER — 0.9 % SODIUM CHLORIDE (POUR BTL) OPTIME
TOPICAL | Status: DC | PRN
  Administered 2018-10-26: 1000 mL

## 2018-10-26 MED ORDER — FENTANYL CITRATE (PF) 250 MCG/5ML IJ SOLN
INTRAMUSCULAR | Status: DC | PRN
  Administered 2018-10-26: 50 ug via INTRAVENOUS

## 2018-10-26 MED ORDER — PROMETHAZINE HCL 25 MG/ML IJ SOLN: 6.2500 mg | INTRAMUSCULAR | Status: DC | PRN

## 2018-10-26 MED ORDER — OXYCODONE HCL 5 MG/5ML PO SOLN: 5.0000 mg | Freq: Once | ORAL | Status: DC | PRN

## 2018-10-26 MED ORDER — MIDAZOLAM HCL 2 MG/2ML IJ SOLN
INTRAMUSCULAR | Status: AC
  Filled 2018-10-26: qty 2

## 2018-10-26 MED ORDER — ONDANSETRON HCL 4 MG/2ML IJ SOLN
INTRAMUSCULAR | Status: DC | PRN
  Administered 2018-10-26: 4 mg via INTRAVENOUS

## 2018-10-26 MED ORDER — PROPOFOL 10 MG/ML IV BOLUS
INTRAVENOUS | Status: DC | PRN
  Administered 2018-10-26: 200 mg via INTRAVENOUS

## 2018-10-26 MED ORDER — PHENYLEPHRINE 40 MCG/ML (10ML) SYRINGE FOR IV PUSH (FOR BLOOD PRESSURE SUPPORT)
PREFILLED_SYRINGE | INTRAVENOUS | Status: DC | PRN
  Administered 2018-10-26 (×5): 80 ug via INTRAVENOUS

## 2018-10-26 MED ORDER — DEXAMETHASONE SODIUM PHOSPHATE 10 MG/ML IJ SOLN
INTRAMUSCULAR | Status: AC
  Filled 2018-10-26: qty 1

## 2018-10-26 MED ORDER — PROPOFOL 10 MG/ML IV BOLUS
INTRAVENOUS | Status: AC
  Filled 2018-10-26: qty 40

## 2018-10-26 MED ORDER — CEFAZOLIN SODIUM-DEXTROSE 2-4 GM/100ML-% IV SOLN
INTRAVENOUS | Status: AC
  Filled 2018-10-26: qty 100

## 2018-10-26 MED ORDER — MIDAZOLAM HCL 2 MG/2ML IJ SOLN
INTRAMUSCULAR | Status: DC | PRN
  Administered 2018-10-26 (×2): 1 mg via INTRAVENOUS

## 2018-10-26 SURGICAL SUPPLY — 53 items
ADH SKN CLS APL DERMABOND .7 (GAUZE/BANDAGES/DRESSINGS) ×1
BAG DECANTER FOR FLEXI CONT (MISCELLANEOUS) ×2 IMPLANT
BINDER BREAST LRG (GAUZE/BANDAGES/DRESSINGS) IMPLANT
BINDER BREAST XLRG (GAUZE/BANDAGES/DRESSINGS) ×1 IMPLANT
BIOPATCH RED 1 DISK 7.0 (GAUZE/BANDAGES/DRESSINGS) ×2 IMPLANT
BLADE 10 SAFETY STRL DISP (BLADE) ×2 IMPLANT
CANISTER SUCT 3000ML PPV (MISCELLANEOUS) ×2 IMPLANT
CHLORAPREP W/TINT 26ML (MISCELLANEOUS) ×2 IMPLANT
COVER SURGICAL LIGHT HANDLE (MISCELLANEOUS) ×2 IMPLANT
COVER WAND RF STERILE (DRAPES) ×2 IMPLANT
DECANTER SPIKE VIAL GLASS SM (MISCELLANEOUS) ×1 IMPLANT
DERMABOND ADVANCED (GAUZE/BANDAGES/DRESSINGS) ×1
DERMABOND ADVANCED .7 DNX12 (GAUZE/BANDAGES/DRESSINGS) ×1 IMPLANT
DRAIN CHANNEL 19F RND (DRAIN) ×3 IMPLANT
DRAPE HALF SHEET 40X57 (DRAPES) ×4 IMPLANT
DRAPE ORTHO SPLIT 77X108 STRL (DRAPES) ×4
DRAPE SURG 17X23 STRL (DRAPES) ×8 IMPLANT
DRAPE SURG ORHT 6 SPLT 77X108 (DRAPES) ×2 IMPLANT
DRAPE WARM FLUID 44X44 (DRAPE) ×2 IMPLANT
DRSG PAD ABDOMINAL 8X10 ST (GAUZE/BANDAGES/DRESSINGS) ×3 IMPLANT
ELECT BLADE 4.0 EZ CLEAN MEGAD (MISCELLANEOUS) ×2
ELECT CAUTERY BLADE 6.4 (BLADE) ×1 IMPLANT
ELECT REM PT RETURN 9FT ADLT (ELECTROSURGICAL) ×2
ELECTRODE BLDE 4.0 EZ CLN MEGD (MISCELLANEOUS) ×1 IMPLANT
ELECTRODE REM PT RTRN 9FT ADLT (ELECTROSURGICAL) ×1 IMPLANT
EVACUATOR SILICONE 100CC (DRAIN) ×3 IMPLANT
GAUZE SPONGE 4X4 12PLY STRL (GAUZE/BANDAGES/DRESSINGS) ×2 IMPLANT
GLOVE BIO SURGEON STRL SZ 6.5 (GLOVE) ×2 IMPLANT
GOWN STRL REUS W/ TWL LRG LVL3 (GOWN DISPOSABLE) ×2 IMPLANT
GOWN STRL REUS W/TWL LRG LVL3 (GOWN DISPOSABLE) ×4
KIT BASIN OR (CUSTOM PROCEDURE TRAY) ×2 IMPLANT
KIT TURNOVER KIT B (KITS) ×2 IMPLANT
NDL 18GX1X1/2 (RX/OR ONLY) (NEEDLE) IMPLANT
NDL HYPO 25GX1X1/2 BEV (NEEDLE) IMPLANT
NEEDLE 18GX1X1/2 (RX/OR ONLY) (NEEDLE) ×2 IMPLANT
NEEDLE HYPO 25GX1X1/2 BEV (NEEDLE) ×2 IMPLANT
NS IRRIG 1000ML POUR BTL (IV SOLUTION) ×4 IMPLANT
PACK GENERAL/GYN (CUSTOM PROCEDURE TRAY) ×2 IMPLANT
PAD ARMBOARD 7.5X6 YLW CONV (MISCELLANEOUS) ×2 IMPLANT
PIN SAFETY STERILE (MISCELLANEOUS) ×2 IMPLANT
SET ASEPTIC TRANSFER (MISCELLANEOUS) IMPLANT
STAPLER VISISTAT 35W (STAPLE) IMPLANT
SUT ETHILON 2 0 FS 18 (SUTURE) ×1 IMPLANT
SUT MON AB 5-0 PS2 18 (SUTURE) ×4 IMPLANT
SUT PDS AB 2-0 CT1 27 (SUTURE) IMPLANT
SUT SILK 3 0 SH 30 (SUTURE) ×3 IMPLANT
SUT VIC AB 3-0 SH 27 (SUTURE) ×6
SUT VIC AB 3-0 SH 27X BRD (SUTURE) ×3 IMPLANT
SUT VICRYL 4-0 PS2 18IN ABS (SUTURE) ×4 IMPLANT
SYR CONTROL 10ML LL (SYRINGE) ×1 IMPLANT
TOWEL OR 17X24 6PK STRL BLUE (TOWEL DISPOSABLE) ×2 IMPLANT
TOWEL OR 17X26 10 PK STRL BLUE (TOWEL DISPOSABLE) ×2 IMPLANT
TRAY FOLEY MTR SLVR 14FR STAT (SET/KITS/TRAYS/PACK) IMPLANT

## 2018-10-26 NOTE — Anesthesia Postprocedure Evaluation (Signed)
Anesthesia Post Note  Patient: Jacqueline Good  Procedure(s) Performed: Removal of right breast expander (Right Breast)     Patient location during evaluation: PACU Anesthesia Type: General Level of consciousness: awake and alert Pain management: pain level controlled Vital Signs Assessment: post-procedure vital signs reviewed and stable Respiratory status: spontaneous breathing, nonlabored ventilation and respiratory function stable Cardiovascular status: blood pressure returned to baseline and stable Postop Assessment: no apparent nausea or vomiting Anesthetic complications: no    Last Vitals:  Vitals:   10/26/18 0924 10/26/18 0930  BP: 102/60   Pulse: 71 72  Resp: 20 15  Temp:  (!) 36.3 C  SpO2: 98% 99%    Last Pain:  Vitals:   10/26/18 0930  TempSrc:   PainSc: 0-No pain                 Lynda Rainwater

## 2018-10-26 NOTE — Interval H&P Note (Signed)
History and Physical Interval Note:  10/26/2018 7:16 AM  Jacqueline Good  has presented today for surgery, with the diagnosis of Acquired Absence Of Right Breast.  The various methods of treatment have been discussed with the patient and family. After consideration of risks, benefits and other options for treatment, the patient has consented to  Procedure(s) with comments: Removal of right breast expander (Right) - 75 min, please as a surgical intervention.  The patient's history has been reviewed, patient examined, no change in status, stable for surgery.  I have reviewed the patient's chart and labs.  Questions were answered to the patient's satisfaction.     [name]

## 2018-10-26 NOTE — Anesthesia Procedure Notes (Signed)
Procedure Name: LMA Insertion Date/Time: 10/26/2018 7:25 AM Performed by: Renato Shin, CRNA Pre-anesthesia Checklist: Patient identified, Emergency Drugs available, Suction available and Patient being monitored Patient Re-evaluated:Patient Re-evaluated prior to induction Oxygen Delivery Method: Circle system utilized Preoxygenation: Pre-oxygenation with 100% oxygen Induction Type: IV induction LMA: LMA inserted LMA Size: 4.0 Number of attempts: 1 Placement Confirmation: positive ETCO2 and breath sounds checked- equal and bilateral Tube secured with: Tape Dental Injury: Teeth and Oropharynx as per pre-operative assessment

## 2018-10-26 NOTE — Op Note (Signed)
Op report    DATE OF OPERATION:  10/26/2018  LOCATION: [facility]  SURGICAL DIVISION: Plastic Surgery  PREOPERATIVE DIAGNOSES:  1. History of right breast cancer.   2. Acquired absence of right breast. 3. Radiation treatment pending.  POSTOPERATIVE DIAGNOSES:  1. History of right breast cancer.   2. Acquired absence of right breast. 3. Radiation treatment pending.  PROCEDURE:  1. Removal of right breast expander for preparation of radiation treatment.  SURGEON:  [place] , DO  ASSISTANT: [place], RNFA  ANESTHESIA:  General.   COMPLICATIONS: None.   INDICATIONS FOR PROCEDURE:  The patient, Jacqueline Good, is a 58 y.o. female born on 06-11-1961, is here for removal of a right breast expander for reconstruction.  She is needing radiation and will not be able to be expanded in enough time combined with healing. MRN: [id number]  CONSENT:  Informed consent was obtained directly from the patient. Risks, benefits and alternatives were fully discussed. Specific risks including but not limited to bleeding, infection, hematoma, seroma, scarring, pain, capsular contracture, asymmetry, wound healing problems, and need for further surgery were all discussed. The patient did have an ample opportunity to have her questions answered to her satisfaction.  I also expressed she would not be eligible for reconstruction until six to twelve months after the end of radiation treatment.  DESCRIPTION OF PROCEDURE:  The patient was taken to the operating room. SCDs were placed and IV antibiotics were given. The patient's chest was prepped and draped in a sterile fashion. A time out was performed and all information confirmed to be correct.    The incision was injected with local.  The #10 blade was used to excise the previous mastectomy scar.  The bovie was used to dissect to the expander.  The expander was removed. She had incorporation of the FlexHD.  The  pocket was irrigated with antibiotic solution and hemostasis was achieved with electrocautery.  There was no purulence or fluid noted. A drain was placed and secured to the chest with 2-0 Silk. The deep layers were closed with 3-0 Monocryl followed by 4-0 Monocryl.  The skin was closed with 5-0 Monocryl and then dermabond was applied.  The ABDs and breast binder were placed.  The patient tolerated the procedure well and there were no complications.  The patient was allowed to wake from anesthesia and taken to the recovery room in satisfactory condition.   The RNFA assisted throughout the case.  The RNFA was essential in retraction and counter traction when needed to make the case progress smoothly.  This retraction and assistance made it possible to see the tissue plans for the procedure.  The assistance was needed for blood control, tissue re-approximation and assisted with closure of the incision site.

## 2018-10-26 NOTE — Anesthesia Preprocedure Evaluation (Signed)
Anesthesia Evaluation  Patient identified by MRN, date of birth, ID band Patient awake    Reviewed: Allergy & Precautions, NPO status , Patient's Chart, lab work & pertinent test results  Airway Mallampati: I  TM Distance: >3 FB Neck ROM: Full    Dental no notable dental hx.    Pulmonary asthma , COPD, former smoker,    Pulmonary exam normal breath sounds clear to auscultation       Cardiovascular negative cardio ROS Normal cardiovascular exam Rhythm:Regular Rate:Normal     Neuro/Psych Anxiety Depression negative neurological ROS  negative psych ROS   GI/Hepatic negative GI ROS, Neg liver ROS, GERD  Medicated and Controlled,  Endo/Other  negative endocrine ROS  Renal/GU negative Renal ROS  negative genitourinary   Musculoskeletal  (+) Arthritis , Osteoarthritis,    Abdominal (+) + obese,   Peds negative pediatric ROS (+)  Hematology negative hematology ROS (+) anemia ,   Anesthesia Other Findings Breast Cancer  Reproductive/Obstetrics negative OB ROS                             Anesthesia Physical  Anesthesia Plan  ASA: III  Anesthesia Plan: General   Post-op Pain Management:    Induction: Intravenous  PONV Risk Score and Plan: 3 and Ondansetron, Midazolam, Treatment may vary due to age or medical condition and Dexamethasone  Airway Management Planned: LMA  Additional Equipment:   Intra-op Plan:   Post-operative Plan: Extubation in OR  Informed Consent: I have reviewed the patients History and Physical, chart, labs and discussed the procedure including the risks, benefits and alternatives for the proposed anesthesia with the patient or authorized representative who has indicated his/her understanding and acceptance.       Plan Discussed with: CRNA and Surgeon  Anesthesia Plan Comments: (See PAT note 07/30/18, Konrad Felix, PA-C)        Anesthesia Quick  Evaluation

## 2018-10-26 NOTE — Transfer of Care (Signed)
Immediate Anesthesia Transfer of Care Note  Patient: Jacqueline Good  Procedure(s) Performed: Removal of right breast expander (Right Breast)  Patient Location: PACU  Anesthesia Type:General  Level of Consciousness: awake, alert  and patient cooperative  Airway & Oxygen Therapy: Patient Spontanous Breathing and Patient connected to face mask oxygen  Post-op Assessment: Report given to RN and Post -op Vital signs reviewed and stable  Post vital signs: Reviewed and stable  Last Vitals:  Vitals Value Taken Time  BP    Temp    Pulse 83 10/26/2018  8:34 AM  Resp 11 10/26/2018  8:34 AM  SpO2 100 % 10/26/2018  8:34 AM  Vitals shown include unvalidated device data.  Last Pain:  Vitals:   10/26/18 0600  TempSrc:   PainSc: 3       Patients Stated Pain Goal: 3 (20/03/79 4446)  Complications: No apparent anesthesia complications

## 2018-10-27 ENCOUNTER — Encounter (HOSPITAL_COMMUNITY): Payer: Self-pay | Admitting: Plastic Surgery

## 2018-10-27 NOTE — Progress Notes (Signed)
Location of Breast Cancer: Right Breast  Histology per Pathology Report:  02/25/18 Diagnosis 1. 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 SEE NOTE. 2. Lymph node, needle/core biopsy, right axilla, axillary LN - METASTATIC BREAST CARCINOMA TO LYMPH NODE.  2.Receptor Status: ER(NEG), PR (NEG), Her2-neu (POS), Ki-(50%)  03/31/18 Diagnosis 1. Breast, right, needle core biopsy, posterior - DUCTAL CARCINOMA IN SITU WITH NECROSIS, SEE COMMENT. 2. Breast, right, needle core biopsy, anterior - DUCTAL CARCINOMA IN SITU WITH FOCI SUSPICIOUS FOR MICROINVASION, SEE COMMENT.  2.Receptor Status: ER(NEG), PR(NEG)   08/12/18 Diagnosis 1. Breast, radical mastectomy (including lymph nodes), right with axillary contents - FIBROSIS WITH INFLAMMATION AND HEMORRHAGE CONSISTENT WITH PREVIOUS BIOPSY. - NO RESIDUAL CARCINOMA IDENTIFIED. - FIBROCYSTIC CHANGES WITH SCLEROSING ADENOSIS AND CALCIFICATIONS. - MARGINS FREE OF TUMOR. - TEN BENIGN LYMPH NODES, ONE WITH FOCAL FIBROSIS. 2. Lymph node, biopsy, Right Highest Axillary - ONE BENIGN LYMPH NODE.  Did patient present with symptoms or was this found on screening mammography?: She reported pain to her right breast and went to her MD who followed up with a mammogram.   Past/Anticipated interventions by surgeon, if any: 08/12/18 PROCEDURE: Procedure(s): RIGHT MODIFIED RADICAL MASTECTOMY, RIGHT BREAST RECONSTRUCTION WITH PLACEMENT OF TISSUE EXPANDER AND FLEX HD ([place]) SURGEON: Alphonsa Overall, M.D.  08/12/18 PROCEDURE:  1.Rightimmediate breast reconstruction with placement of Acellular Dermal Matrix and tissue expanders. SURGEON:Claire Sanger Dillingham, DO  10/27/18 PROCEDURE:  1. Removal of right breast expander for preparation of radiation treatment. SURGEON: Claire Sanger Dillingham, DO  Past/Anticipated  interventions by medical oncology, if any: 10/13/18 PLAN: Shalva continues to have some difficulties with reconstruction and this is delaying her adjuvant radiation.  I do think this needs to be started relatively soon and I have alerted Dr. Isidore Moos to this issue. From our point of view she is tolerating trastuzumab well and she will continue it through August of this year.  She is due for an echocardiogram and I have entered the order for that to be done within the next week or 2. She knows to call for any other issue that may develop before the next visit. Jacqueline Good, Jacqueline Dad, MD  10/13/18  10/25/18 Dr. Jana Hakim telephone note: I called the patient and let her know her echo showed a slightly decreased ejection fraction.  We can hold off Herceptin for the next 2 to 3 months until that resolves.  In the meantime she will be receiving radiation and completing her surgery.  Lymphedema issues, if any: She denies.   Pain issues, if any:  She reports soreness to her right breast due to infection and surgery. She still has a drain in place.   SAFETY ISSUES:  Prior radiation? No  Pacemaker/ICD? No  Possible current pregnancy? No  Is the patient on methotrexate? No  Current Complaints / other details:    BP (!) 114/53 (BP Location: Left Arm)   Pulse 64   Temp 98.4 F (36.9 C) (Oral)   Ht 5' 5"  (1.651 m)   Wt 197 lb 12.8 oz (89.7 kg)   SpO2 98% Comment: room air  BMI 32.92 kg/m    Wt Readings from Last 3 Encounters:  10/29/18 197 lb 12.8 oz (89.7 kg)  10/26/18 198 lb 12.8 oz (90.2 kg)  10/22/18 198 lb 12.8 oz (90.2 kg)

## 2018-10-28 ENCOUNTER — Telehealth: Payer: Self-pay

## 2018-10-28 NOTE — Patient Instructions (Addendum)
Coronavirus (COVID-19) Are you at risk?  Are you at risk for the Coronavirus (COVID-19)?  To be considered HIGH RISK for Coronavirus (COVID-19), you have to meet the following criteria:  . Traveled to [place], [place], [place], [place] or [place]; or in the [place] to [place], [place], [place], or [place]; and have fever, cough, and shortness of breath within the last 2 weeks of travel OR . Been in close contact with a person diagnosed with COVID-19 within the last 2 weeks and have fever, cough, and shortness of breath . IF YOU DO NOT MEET THESE CRITERIA, YOU ARE CONSIDERED LOW RISK FOR COVID-19.  What to do if you are HIGH RISK for COVID-19?  . If you are having a medical emergency, call 911. . Seek medical care right away. Before you go to a doctor's office, urgent care or emergency department, call ahead and tell them about your recent travel, contact with someone diagnosed with COVID-19, and your symptoms. You should receive instructions from your physician's office regarding next steps of care.  . When you arrive at healthcare provider, tell the healthcare staff immediately you have returned from visiting [place], [place], [place], [place] or [place]; or traveled in the [place] to [place], [place], [place], or [place]; in the last two weeks or you have been in close contact with a person diagnosed with COVID-19 in the last 2 weeks.   . Tell the health care staff about your symptoms: fever, cough and shortness of breath. . After you have been seen by a medical provider, you will be either: o Tested for (COVID-19) and discharged home on quarantine except to seek medical care if symptoms worsen, and asked to  - Stay home and avoid contact with others until you get your results (4-5 days)  - Avoid travel on public transportation if possible (such as bus, train, or airplane) or o Sent to the Emergency Department by EMS for evaluation, COVID-19 testing, and possible  admission depending on your condition and test results.  What to do if you are LOW RISK for COVID-19?  Reduce your risk of any infection by using the same precautions used for avoiding the common cold or flu:  . Wash your hands often with soap and warm water for at least 20 seconds.  If soap and water are not readily available, use an alcohol-based hand sanitizer with at least 60% alcohol.  . If coughing or sneezing, cover your mouth and nose by coughing or sneezing into the elbow areas of your shirt or coat, into a tissue or into your sleeve (not your hands). . Avoid shaking hands with others and consider head nods or verbal greetings only. . Avoid touching your eyes, nose, or mouth with unwashed hands.  . Avoid close contact with people who are sick. . Avoid places or events with large numbers of people in one location, like concerts or sporting events. . Carefully consider travel plans you have or are making. . If you are planning any travel outside or inside the [place], visit the CDC's Travelers' Health webpage for the latest health notices. . If you have some symptoms but not all symptoms, continue to monitor at home and seek medical attention if your symptoms worsen. . If you are having a medical emergency, call 911.   ADDITIONAL HEALTHCARE OPTIONS FOR PATIENTS  [facility] / e-Visit: [email or web address]         [facility]: [phone]  [place]   [facility]: [phone]                   [facility]: [phone]   

## 2018-10-28 NOTE — Telephone Encounter (Signed)
Jacqueline Good was pre-screened by me regarding COVID-19 for her consult with Dr. Isidore Moos tomorrow. She denies recent travel, fever, cough, or shortness of breath. She plans to be here for her appointment tomorrow.

## 2018-10-28 NOTE — Progress Notes (Signed)
Radiation Oncology         [phone] ________________________________  Name: Jacqueline Good MRN: [id number]  Date: 10/29/2018  DOB: [birth year]  Follow-Up Visit Note  Outpatient  CC: [name], MD  [name], [name], MD  Diagnosis:      ICD-10-CM   1. Malignant neoplasm of upper-outer quadrant of right breast in female, estrogen receptor negative ([name]) C50.411    Z17.1      Cancer Staging Malignant neoplasm of upper-outer quadrant of right breast in female, estrogen receptor negative ([name]) Staging form: Breast, AJCC 8th Edition - Clinical stage from 03/03/2018: Stage IIA (cT52m, cN1, cM0, G2, ER-, PR-, HER2+) - Unsigned   CHIEF COMPLAINT: Here to discuss management of right breast cancer  Narrative:  The patient returns today for follow-up to review radiation treatment options. She last saw me on 09/07/2018 to discuss radiation therapy, and we spoke about waiting until after reconstruction was completed to begin radiation. However, she experienced two infections to her expander in February 2020 and was treated with two rounds of antibiotics. She then underwent removal of the expander on 10/26/2018.   She has continued on trastuzumab under Dr. [name] but this has been held as of 10/25/2018 due to her recent echocardiogram showing a slightly decreased ejection fraction.   Since her last visit, she underwent genetic testing on 09/22/2018 revealing: negative for pathogenic mutations.  Symptomatically, the patient reports: modest drainage from surgery site, drain in place.  Decreased ROM in arm.          ALLERGIES:  is allergic to gadolinium derivatives; hydrocodone; and iodinated diagnostic agents.  Meds: Current Outpatient Medications  Medication Sig Dispense Refill  . albuterol (VENTOLIN HFA) 108 (90 Base) MCG/ACT inhaler INHALE 2 PUFFS INTO THE LUNGS EVERY 6 HOURS AS NEEDED FOR WHEEZING (Patient taking differently: Inhale 2 puffs into the lungs every 6 (six) hours  as needed for wheezing or shortness of breath. ) 54 g 3  . ALPRAZolam (XANAX) 1 MG tablet 1 po q 6 hr prn.  No more than 3.5 qd. 105 tablet 3  . ARIPiprazole (ABILIFY) 2 MG tablet Take 1 tablet (2 mg total) by mouth daily. (Patient taking differently: Take 2 mg by mouth every evening. ) 30 tablet 1  . diphenoxylate-atropine (LOMOTIL) 2.5-0.025 MG tablet Take 1 tablet by mouth 4 (four) times daily as needed for diarrhea or loose stools. 30 tablet 0  . fluticasone (FLONASE) 50 MCG/ACT nasal spray Place 2 sprays into both nostrils daily. (Patient taking differently: Place 2 sprays into both nostrils daily as needed for allergies. ) 1 g 0  . Fluticasone-Salmeterol (ADVAIR DISKUS) 250-50 MCG/DOSE AEPB INHALE 1 PUFF INTO THE LUNGS 2 TIMES DAILY. (Patient taking differently: Inhale 1 puff into the lungs 2 (two) times daily as needed (wheezing or shortness of breath). ) 60 each 2  . lidocaine-prilocaine (EMLA) cream Apply to affected area once (Patient taking differently: Apply 1 application topically daily as needed (prior to port being flushed.). ) 30 g 3  . loperamide (IMODIUM) 2 MG capsule Take 2 mg by mouth as needed for diarrhea or loose stools.    .[name] (CLARITIN) 10 MG tablet Take 10 mg by mouth daily as needed for allergies.    .[name] (PRILOSEC) 40 MG capsule Take 1 capsule (40 mg total) by mouth daily. (Patient taking differently: Take 40 mg by mouth every evening. ) 30 capsule 2  . propranolol (INDERAL) 40 MG tablet Take 40 mg by mouth  daily as needed (heart palpitation.).     Marland Kitchen venlafaxine (EFFEXOR) 75 MG tablet Take 3 tablets (225 mg total) by mouth daily. Take 2 qd until 08/07/18 and then start 3 po qd. (Patient taking differently: Take 75 mg by mouth 2 (two) times daily. Take 2 qd until 08/07/18 and then start 3 po qd) 90 tablet 3  . zolpidem (AMBIEN) 10 MG tablet Take 1 tablet (10 mg total) by mouth at bedtime as needed for sleep (1/2-1 tab). (Patient taking differently: Take 10 mg  by mouth at bedtime. ) 30 tablet 3  . busPIRone (BUSPAR) 30 MG tablet Take 1 tablet (30 mg total) by mouth 2 (two) times daily. (Patient not taking: Reported on 10/29/2018) 60 tablet 3  . dexamethasone (DECADRON) 4 MG tablet Take 2 tablets (8 mg total) by mouth 2 (two) times daily. Start the day before Taxotere. Take once the day after, then 2 times a day x 2d. (Patient not taking: Reported on 10/29/2018) 30 tablet 1   No current facility-administered medications for this encounter.     Physical Findings:  height is 5' 5"  (1.651 m) and weight is 197 lb 12.8 oz (89.7 kg). Her oral temperature is 98.4 F (36.9 C). Her blood pressure is 114/53 (abnormal) and her pulse is 64. Her oxygen saturation is 98%. .     General: Alert and oriented, in no acute distress HEENT: Head is normocephalic. Extraocular movements are intact.   Psychiatric: Judgment and insight are intact. Affect is appropriate. Breast exam reveals status post right mastectomy.  A drain is in place with serosanguineous fluid.  There is some swelling in the right chest wall  Lab Findings: Lab Results  Component Value Date   WBC 6.7 10/13/2018   HGB 9.5 (L) 10/13/2018   HCT 30.4 (L) 10/13/2018   MCV 93.3 10/13/2018   PLT 227 10/13/2018    Radiographic Findings: No results found.  Impression/Plan: Right Breast Cancer s/p chemotherapy and right mastectomy   ypT0N0  I originally recommended approximately 6.5 weeks of daily radiation treatments.  However given that the expander has been removed and given the complete response to chemotherapy as well as my hope to minimize her risk of exposure to Covid19 virus I think it is most prudent that we give her a 5-week course of radiotherapy without a scar boost .  Simulation was initially attempted after her visit with me today but she could not tolerate the simulation due to tightness in her chest wall and decreased range of motion in her arm.  Since the physical therapy services are  closed right now in the pandemic I taught her exercises to do on her own to increase range of motion in her shoulder.  We will bring her back in a few weeks to reattempt simulation and start treatment  We discussed measures to reduce the risk of infection during the COVID-19 pandemic.     I spent 20 minutes minutes face to face with the patient and more than 50% of that time was spent in counseling and/or coordination of care. _____________________________________   Eppie Gibson, MD   This document serves as a record of services personally performed by Eppie Gibson, MD. It was created on her behalf by Wilburn Mylar, a trained medical scribe. The creation of this record is based on the scribe's personal observations and the provider's statements to them. This document has been checked and approved by the attending provider.

## 2018-10-29 ENCOUNTER — Other Ambulatory Visit: Payer: Self-pay

## 2018-10-29 ENCOUNTER — Encounter: Payer: Self-pay | Admitting: Radiation Oncology

## 2018-10-29 ENCOUNTER — Ambulatory Visit
Admission: RE | Admit: 2018-10-29 | Discharge: 2018-10-29 | Disposition: A | Discharge status: Home / Self Care | Payer: Medicaid Other | Source: Ambulatory Visit | Attending: Radiation Oncology | Admitting: Radiation Oncology

## 2018-10-29 ENCOUNTER — Ambulatory Visit: Admission: RE | Admit: 2018-10-29 | Payer: Medicaid Other | Source: Ambulatory Visit | Admitting: Radiation Oncology

## 2018-10-29 ENCOUNTER — Encounter: Payer: Self-pay | Admitting: Genetic Counselor

## 2018-10-29 ENCOUNTER — Ambulatory Visit: Payer: Self-pay | Admitting: Plastic Surgery

## 2018-10-29 ENCOUNTER — Other Ambulatory Visit: Payer: Self-pay | Admitting: Pharmacist

## 2018-10-29 DIAGNOSIS — Z9221 Personal history of antineoplastic chemotherapy: Secondary | ICD-10-CM | POA: Diagnosis not present

## 2018-10-29 DIAGNOSIS — Z171 Estrogen receptor negative status [ER-]: Secondary | ICD-10-CM | POA: Diagnosis not present

## 2018-10-29 DIAGNOSIS — J45901 Unspecified asthma with (acute) exacerbation: Secondary | ICD-10-CM

## 2018-10-29 DIAGNOSIS — J441 Chronic obstructive pulmonary disease with (acute) exacerbation: Secondary | ICD-10-CM

## 2018-10-29 DIAGNOSIS — Z9011 Acquired absence of right breast and nipple: Secondary | ICD-10-CM | POA: Insufficient documentation

## 2018-10-29 DIAGNOSIS — C50411 Malignant neoplasm of upper-outer quadrant of right female breast: Secondary | ICD-10-CM | POA: Diagnosis present

## 2018-10-29 DIAGNOSIS — Z79899 Other long term (current) drug therapy: Secondary | ICD-10-CM | POA: Diagnosis not present

## 2018-10-29 MED ORDER — FLUTICASONE-SALMETEROL 250-50 MCG/DOSE IN AEPB: INHALATION_SPRAY | RESPIRATORY_TRACT | 2 refills | Status: DC

## 2018-10-29 MED ORDER — ALBUTEROL SULFATE HFA 108 (90 BASE) MCG/ACT IN AERS: INHALATION_SPRAY | RESPIRATORY_TRACT | 2 refills | Status: DC

## 2018-10-29 NOTE — Telephone Encounter (Signed)
Received notice from [facility] that pt is requesting refills on Advair & Ventolin. She has not been seen in-clinic by Dr. Wynetta Emery in some time. In fact, provider on the rx's is listed as Dr. Doreene Burke. Will route request to PCP for approval. If approved, pt needs these sent to [facility].

## 2018-11-01 MED FILL — ADVAIR 250/50 DISKUS: 250-50 | 30 days supply | Qty: 60 | Fill #0

## 2018-11-01 MED FILL — PROAIR HFA 90 MCG INHALER: 108 (90 BAS | 25 days supply | Qty: 9 | Fill #0

## 2018-11-02 ENCOUNTER — Other Ambulatory Visit: Payer: Self-pay | Admitting: Adult Health

## 2018-11-02 DIAGNOSIS — Z171 Estrogen receptor negative status [ER-]: Secondary | ICD-10-CM

## 2018-11-02 DIAGNOSIS — C50411 Malignant neoplasm of upper-outer quadrant of right female breast: Secondary | ICD-10-CM

## 2018-11-03 ENCOUNTER — Other Ambulatory Visit: Payer: Medicaid Other

## 2018-11-03 ENCOUNTER — Ambulatory Visit: Payer: Medicaid Other

## 2018-11-03 ENCOUNTER — Other Ambulatory Visit: Payer: Self-pay | Admitting: General Practice

## 2018-11-03 ENCOUNTER — Encounter: Payer: Self-pay | Admitting: Plastic Surgery

## 2018-11-05 ENCOUNTER — Ambulatory Visit (INDEPENDENT_AMBULATORY_CARE_PROVIDER_SITE_OTHER): Payer: Medicaid Other | Admitting: Plastic Surgery

## 2018-11-05 ENCOUNTER — Other Ambulatory Visit: Payer: Self-pay

## 2018-11-05 ENCOUNTER — Encounter: Payer: Self-pay | Admitting: Plastic Surgery

## 2018-11-05 VITALS — BP 117/69 | HR 61 | Temp 98.6°F | Ht 65.0 in | Wt 200.0 lb

## 2018-11-05 DIAGNOSIS — Z9011 Acquired absence of right breast and nipple: Secondary | ICD-10-CM

## 2018-11-05 DIAGNOSIS — Z95828 Presence of other vascular implants and grafts: Secondary | ICD-10-CM

## 2018-11-05 NOTE — Progress Notes (Signed)
The patient is a 58 year old female here for follow-up after removal of the right breast expander.  The incision is healing well.  There is no redness or swelling.  The drain output is minimal.  There does not appear to be any hematoma or seroma.  She has not had any fevers.  We remove the drain today and and she is being set up for her radiation treatment.  I would like to see her back once the radiation begins.

## 2018-11-06 MED FILL — diazePAM 2 MG TABS: 2 | 10 days supply | Qty: 20 | Fill #0

## 2018-11-08 ENCOUNTER — Ambulatory Visit
Admission: RE | Admit: 2018-11-08 | Discharge: 2018-11-08 | Disposition: A | Discharge status: Home / Self Care | Payer: Medicaid Other | Source: Ambulatory Visit | Attending: Radiation Oncology | Admitting: Radiation Oncology

## 2018-11-08 ENCOUNTER — Other Ambulatory Visit: Payer: Self-pay

## 2018-11-08 DIAGNOSIS — C50411 Malignant neoplasm of upper-outer quadrant of right female breast: Secondary | ICD-10-CM | POA: Insufficient documentation

## 2018-11-08 DIAGNOSIS — Z510 Encounter for antineoplastic radiation therapy: Secondary | ICD-10-CM | POA: Insufficient documentation

## 2018-11-08 DIAGNOSIS — Z171 Estrogen receptor negative status [ER-]: Secondary | ICD-10-CM | POA: Diagnosis not present

## 2018-11-08 NOTE — Progress Notes (Signed)
  Radiation Oncology         [phone] ________________________________  Name: Jacqueline Good MRN: [id number]  Date: 11/08/2018  DOB: [birth year]  SIMULATION AND TREATMENT PLANNING NOTE    Outpatient  DIAGNOSIS:     ICD-10-CM   1. Malignant neoplasm of upper-outer quadrant of right breast in female, estrogen receptor negative ([place]) C50.411    Z17.1     NARRATIVE:  The patient was brought to the [facility].  Identity was confirmed.  All relevant records and images related to the planned course of therapy were reviewed.  The patient freely provided informed written consent to proceed with treatment after reviewing the details related to the planned course of therapy. The consent form was witnessed and verified by the simulation staff.    Then, the patient was set-up in a stable reproducible supine position for radiation therapy with her ipsilateral arm over her head, and her upper body secured in a custom-made Vac-lok device.  CT images were obtained.  Surface markings were placed.  The CT images were loaded into the planning software.    TREATMENT PLANNING NOTE: Treatment planning then occurred.  The radiation prescription was entered and confirmed.     A total of 5 medically necessary complex treatment devices were fabricated and supervised by me: 4 fields with MLCs for custom blocks to protect heart, and lungs;  and, a Vac-lok. MORE COMPLEX DEVICES MAY BE MADE IN DOSIMETRY FOR FIELD IN FIELD BEAMS FOR DOSE HOMOGENEITY.  I have requested : 3D Simulation which is medically necessary to give adequate dose to at risk tissues while sparing lungs and heart.  I have requested a DVH of the following structures: lungs, heart, axillary mid point, esophagus, cord.    The patient will receive 50 Gy in 25 fractions to the right chest wall and regional nodes with 4 fields.  This will not be followed by a boost.  Optical Surface Tracking Plan:  Since intensity modulated radiotherapy  (IMRT) and 3D conformal radiation treatment methods are predicated on accurate and precise positioning for treatment, intrafraction motion monitoring is medically necessary to ensure accurate and safe treatment delivery. The ability to quantify intrafraction motion without excessive ionizing radiation dose can only be performed with optical surface tracking. Accordingly, surface imaging offers the opportunity to obtain 3D measurements of patient position throughout IMRT and 3D treatments without excessive radiation exposure. I am ordering optical surface tracking for this patient's upcoming course of radiotherapy.  ________________________________   Reference:  Ursula Alert, J, et al. Surface imaging-based analysis of intrafraction motion for breast radiotherapy patients.Journal of [place], n. 6, nov. 2014. ISSN [id number].  Available at: <http://www.jacmp.org/index.php/jacmp/article/view/4957>.    -----------------------------------  Eppie Gibson, MD

## 2018-11-10 ENCOUNTER — Encounter: Payer: Self-pay | Admitting: General Practice

## 2018-11-10 DIAGNOSIS — Z171 Estrogen receptor negative status [ER-]: Secondary | ICD-10-CM | POA: Diagnosis not present

## 2018-11-10 DIAGNOSIS — Z510 Encounter for antineoplastic radiation therapy: Secondary | ICD-10-CM | POA: Diagnosis present

## 2018-11-10 DIAGNOSIS — C50411 Malignant neoplasm of upper-outer quadrant of right female breast: Secondary | ICD-10-CM | POA: Insufficient documentation

## 2018-11-10 NOTE — Progress Notes (Signed)
[facility] Team contacted patient to assess for food insecurity and other psychosocial needs during current COVID19 pandemic.  "I'm really nervous about this virus, sleep is off again, cannot sleep."  Worried about catching it, "I do have to go to the grocery store every now and then."  "I'm going crazy staying in the house, I need to get out of the house."  Finds current quarantine situation both scary and frustrating.  Worries about elderly mother who lives next door - tries to go out so mother does not have to.  Finding it difficult to find things to do at home to occupy her time.  Problem solved several suggestions for coping w current situation including limiting visual news, limiting news in general, trying options for different stimulation including [employer] classes, [employer] tours and similar. Will touch base w patient weekly by phone, will also email list of suggestions for coping w quarantine.  Patient/family expressed concerns regarding access to food or managing costs of food during this time.  Support Team member discussed resources and will follow up as resources are identified.  Support Team member encouraged patient to call if changes occur or they have any other questions/concerns.    Beverely Pace, [place]

## 2018-11-12 ENCOUNTER — Telehealth: Payer: Self-pay | Admitting: Emergency Medicine

## 2018-11-12 NOTE — Telephone Encounter (Signed)
Pt called requesting refill of omeprazole prescription, left VM stating it was denied by [facility].  Refill sent by NP Mendel Ryder to [facility], pt VU and to call with any further questions or concerns.

## 2018-11-15 ENCOUNTER — Ambulatory Visit (HOSPITAL_COMMUNITY): Payer: Medicaid Other | Admitting: Psychiatry

## 2018-11-15 ENCOUNTER — Ambulatory Visit: Payer: Medicaid Other

## 2018-11-15 ENCOUNTER — Ambulatory Visit
Admission: RE | Admit: 2018-11-15 | Discharge: 2018-11-15 | Disposition: A | Discharge status: Home / Self Care | Payer: Medicaid Other | Source: Ambulatory Visit | Attending: Radiation Oncology | Admitting: Radiation Oncology

## 2018-11-15 ENCOUNTER — Other Ambulatory Visit: Payer: Self-pay

## 2018-11-15 ENCOUNTER — Other Ambulatory Visit: Payer: Self-pay | Admitting: Adult Health

## 2018-11-15 DIAGNOSIS — Z510 Encounter for antineoplastic radiation therapy: Secondary | ICD-10-CM | POA: Diagnosis not present

## 2018-11-15 DIAGNOSIS — C50411 Malignant neoplasm of upper-outer quadrant of right female breast: Secondary | ICD-10-CM

## 2018-11-15 DIAGNOSIS — Z171 Estrogen receptor negative status [ER-]: Secondary | ICD-10-CM

## 2018-11-15 MED ORDER — RADIAPLEXRX EX GEL
Freq: Once | CUTANEOUS | Status: AC
  Administered 2018-11-15: 16:00:00 via TOPICAL

## 2018-11-15 MED ORDER — ALRA NON-METALLIC DEODORANT (RAD-ONC)
1.0000 "application " | Freq: Once | TOPICAL | Status: AC
  Administered 2018-11-15: 1 via TOPICAL

## 2018-11-15 MED FILL — OMEPRAZOLE 40 MG CPDR: 40 | 30 days supply | Qty: 30 | Fill #0

## 2018-11-15 NOTE — Progress Notes (Signed)
Pt here for patient teaching.    Pt given Radiation and You booklet, skin care instructions, Alra deodorant and Radiaplex gel.    Reviewed areas of pertinence such as fatigue, hair loss, skin changes, breast tenderness and breast swelling .   Pt able to give teach back of to pat skin and use unscented/gentle soap,apply Radiaplex bid, avoid applying anything to skin within 4 hours of treatment, avoid wearing an under wire bra and to use an electric razor if they must shave.   Pt demonstrated understanding of information given and will contact nursing with any questions or concerns.    Http://rtanswers.org/treatmentinformation/whattoexpect/index         

## 2018-11-16 ENCOUNTER — Other Ambulatory Visit: Payer: Self-pay

## 2018-11-16 ENCOUNTER — Ambulatory Visit
Admission: RE | Admit: 2018-11-16 | Discharge: 2018-11-16 | Disposition: A | Discharge status: Home / Self Care | Payer: Medicaid Other | Source: Ambulatory Visit | Attending: Radiation Oncology | Admitting: Radiation Oncology

## 2018-11-16 ENCOUNTER — Ambulatory Visit: Payer: Medicaid Other

## 2018-11-16 DIAGNOSIS — Z510 Encounter for antineoplastic radiation therapy: Secondary | ICD-10-CM | POA: Diagnosis not present

## 2018-11-17 ENCOUNTER — Ambulatory Visit: Payer: Medicaid Other

## 2018-11-17 ENCOUNTER — Other Ambulatory Visit: Payer: Self-pay

## 2018-11-17 ENCOUNTER — Ambulatory Visit
Admission: RE | Admit: 2018-11-17 | Discharge: 2018-11-17 | Disposition: A | Discharge status: Home / Self Care | Payer: Medicaid Other | Source: Ambulatory Visit | Attending: Radiation Oncology | Admitting: Radiation Oncology

## 2018-11-17 ENCOUNTER — Telehealth: Payer: Self-pay | Admitting: Plastic Surgery

## 2018-11-17 DIAGNOSIS — Z510 Encounter for antineoplastic radiation therapy: Secondary | ICD-10-CM | POA: Diagnosis not present

## 2018-11-17 NOTE — Telephone Encounter (Signed)
Called patient to confirm appointment scheduled for tomorrow. Patient answered the following questions: °1.Has the patient traveled outside of the state of [place] at all within the past 6 weeks? No °2.Does the patient have a fever or cough at all? No °3.Has the patient been tested for COVID? Had a positive COVID test? No °4. Has the patient been in contact with anyone who has tested positive? No ° °

## 2018-11-18 ENCOUNTER — Encounter: Payer: Self-pay | Admitting: Plastic Surgery

## 2018-11-18 ENCOUNTER — Ambulatory Visit (INDEPENDENT_AMBULATORY_CARE_PROVIDER_SITE_OTHER): Payer: Medicaid Other | Admitting: Plastic Surgery

## 2018-11-18 ENCOUNTER — Other Ambulatory Visit: Payer: Self-pay

## 2018-11-18 ENCOUNTER — Ambulatory Visit
Admission: RE | Admit: 2018-11-18 | Discharge: 2018-11-18 | Disposition: A | Discharge status: Home / Self Care | Payer: Medicaid Other | Source: Ambulatory Visit | Attending: Radiation Oncology | Admitting: Radiation Oncology

## 2018-11-18 ENCOUNTER — Ambulatory Visit: Payer: Medicaid Other

## 2018-11-18 VITALS — BP 127/88 | HR 70 | Temp 98.4°F | Ht 65.0 in | Wt 200.0 lb

## 2018-11-18 DIAGNOSIS — Z9011 Acquired absence of right breast and nipple: Secondary | ICD-10-CM

## 2018-11-18 DIAGNOSIS — Z510 Encounter for antineoplastic radiation therapy: Secondary | ICD-10-CM | POA: Diagnosis not present

## 2018-11-18 NOTE — Progress Notes (Signed)
   Subjective:    Patient ID: Jacqueline Good, female    DOB: [birth year], 59 y.o.   MRN: [id number]  The patient is a 58 yrs old bf here for follow up on her right breast.  She has started radiation.  The skin and incision is healing well.  There is no sign of infection.    Review of Systems  Constitutional: Negative.   HENT: Negative.   Eyes: Negative.   Respiratory: Negative.   Cardiovascular: Negative.   Gastrointestinal: Negative.   Genitourinary: Negative.   Musculoskeletal: Negative.   Hematological: Negative.   Psychiatric/Behavioral: Negative.        Objective:   Physical Exam Constitutional:      Appearance: Normal appearance.  HENT:     Head: Normocephalic and atraumatic.     Mouth/Throat:     Mouth: Mucous membranes are moist.  Pulmonary:     Effort: Pulmonary effort is normal. No respiratory distress.     Breath sounds: No wheezing.  Neurological:     General: No focal deficit present.     Mental Status: She is alert.  Psychiatric:        Mood and Affect: Mood normal.        Behavior: Behavior normal.       Assessment & Plan:  Acquired absence of right breast  S/P mastectomy, right Follow up in 2 months. Light massage to the area.  She is going to think about reduction on the left.

## 2018-11-19 ENCOUNTER — Ambulatory Visit
Admission: RE | Admit: 2018-11-19 | Discharge: 2018-11-19 | Disposition: A | Discharge status: Home / Self Care | Payer: Medicaid Other | Source: Ambulatory Visit | Attending: Radiation Oncology | Admitting: Radiation Oncology

## 2018-11-19 ENCOUNTER — Ambulatory Visit: Payer: Medicaid Other

## 2018-11-19 ENCOUNTER — Other Ambulatory Visit: Payer: Self-pay

## 2018-11-19 DIAGNOSIS — Z510 Encounter for antineoplastic radiation therapy: Secondary | ICD-10-CM | POA: Diagnosis not present

## 2018-11-22 ENCOUNTER — Ambulatory Visit
Admission: RE | Admit: 2018-11-22 | Discharge: 2018-11-22 | Disposition: A | Discharge status: Home / Self Care | Payer: Medicaid Other | Source: Ambulatory Visit | Attending: Radiation Oncology | Admitting: Radiation Oncology

## 2018-11-22 ENCOUNTER — Ambulatory Visit: Payer: Medicaid Other

## 2018-11-22 ENCOUNTER — Other Ambulatory Visit: Payer: Self-pay

## 2018-11-22 DIAGNOSIS — Z510 Encounter for antineoplastic radiation therapy: Secondary | ICD-10-CM | POA: Diagnosis not present

## 2018-11-23 ENCOUNTER — Ambulatory Visit
Admission: RE | Admit: 2018-11-23 | Discharge: 2018-11-23 | Disposition: A | Discharge status: Home / Self Care | Payer: Medicaid Other | Source: Ambulatory Visit | Attending: Radiation Oncology | Admitting: Radiation Oncology

## 2018-11-23 ENCOUNTER — Ambulatory Visit: Payer: Medicaid Other

## 2018-11-23 ENCOUNTER — Other Ambulatory Visit: Payer: Self-pay

## 2018-11-23 DIAGNOSIS — Z510 Encounter for antineoplastic radiation therapy: Secondary | ICD-10-CM | POA: Diagnosis not present

## 2018-11-24 ENCOUNTER — Other Ambulatory Visit: Payer: Self-pay

## 2018-11-24 ENCOUNTER — Telehealth: Payer: Self-pay

## 2018-11-24 ENCOUNTER — Ambulatory Visit
Admission: RE | Admit: 2018-11-24 | Discharge: 2018-11-24 | Disposition: A | Discharge status: Home / Self Care | Payer: Medicaid Other | Source: Ambulatory Visit | Attending: Radiation Oncology | Admitting: Radiation Oncology

## 2018-11-24 ENCOUNTER — Ambulatory Visit: Payer: Self-pay

## 2018-11-24 ENCOUNTER — Ambulatory Visit: Payer: Medicaid Other

## 2018-11-24 DIAGNOSIS — Z510 Encounter for antineoplastic radiation therapy: Secondary | ICD-10-CM | POA: Diagnosis not present

## 2018-11-24 MED FILL — ARIPIPRAZOLE 2 MG TABS: 2 | 30 days supply | Qty: 30 | Fill #1

## 2018-11-24 MED FILL — VENLAFAXINE HCL 75 MG TAB: 75 | 30 days supply | Qty: 90 | Fill #0

## 2018-11-24 NOTE — Telephone Encounter (Signed)
Nurse returned call.  No answer, VM left.

## 2018-11-24 NOTE — Telephone Encounter (Signed)
Nurse spoke with patient regarding questions about holding Herceptin.    Nurse reviewed reasoning why Herceptin was being held due to low ejection fraction.  Patient questioning her radiation appointments being changed, patient will review with Dr. Isidore Moos when she see's her on Monday.    No further needs at this time.

## 2018-11-25 ENCOUNTER — Ambulatory Visit: Payer: Medicaid Other

## 2018-11-25 ENCOUNTER — Other Ambulatory Visit: Payer: Self-pay

## 2018-11-25 ENCOUNTER — Ambulatory Visit
Admission: RE | Admit: 2018-11-25 | Discharge: 2018-11-25 | Disposition: A | Discharge status: Home / Self Care | Payer: Medicaid Other | Source: Ambulatory Visit | Attending: Radiation Oncology | Admitting: Radiation Oncology

## 2018-11-25 DIAGNOSIS — Z510 Encounter for antineoplastic radiation therapy: Secondary | ICD-10-CM | POA: Diagnosis not present

## 2018-11-26 ENCOUNTER — Ambulatory Visit
Admission: RE | Admit: 2018-11-26 | Discharge: 2018-11-26 | Disposition: A | Discharge status: Home / Self Care | Payer: Medicaid Other | Source: Ambulatory Visit | Attending: Radiation Oncology | Admitting: Radiation Oncology

## 2018-11-26 ENCOUNTER — Other Ambulatory Visit: Payer: Self-pay

## 2018-11-26 ENCOUNTER — Ambulatory Visit: Payer: Medicaid Other

## 2018-11-26 DIAGNOSIS — Z510 Encounter for antineoplastic radiation therapy: Secondary | ICD-10-CM | POA: Diagnosis not present

## 2018-11-27 ENCOUNTER — Ambulatory Visit (HOSPITAL_COMMUNITY): Payer: Medicaid Other | Admitting: Psychiatry

## 2018-11-29 ENCOUNTER — Ambulatory Visit: Payer: Medicaid Other

## 2018-11-29 ENCOUNTER — Ambulatory Visit
Admission: RE | Admit: 2018-11-29 | Discharge: 2018-11-29 | Disposition: A | Discharge status: Home / Self Care | Payer: Medicaid Other | Source: Ambulatory Visit | Attending: Radiation Oncology | Admitting: Radiation Oncology

## 2018-11-29 ENCOUNTER — Other Ambulatory Visit: Payer: Self-pay

## 2018-11-29 DIAGNOSIS — Z510 Encounter for antineoplastic radiation therapy: Secondary | ICD-10-CM | POA: Diagnosis not present

## 2018-11-30 ENCOUNTER — Other Ambulatory Visit: Payer: Self-pay

## 2018-11-30 ENCOUNTER — Ambulatory Visit
Admission: RE | Admit: 2018-11-30 | Discharge: 2018-11-30 | Disposition: A | Discharge status: Home / Self Care | Payer: Medicaid Other | Source: Ambulatory Visit | Attending: Radiation Oncology | Admitting: Radiation Oncology

## 2018-11-30 ENCOUNTER — Ambulatory Visit: Payer: Medicaid Other

## 2018-11-30 DIAGNOSIS — Z510 Encounter for antineoplastic radiation therapy: Secondary | ICD-10-CM | POA: Diagnosis not present

## 2018-12-01 ENCOUNTER — Ambulatory Visit: Payer: Medicaid Other

## 2018-12-01 ENCOUNTER — Ambulatory Visit
Admission: RE | Admit: 2018-12-01 | Discharge: 2018-12-01 | Disposition: A | Discharge status: Home / Self Care | Payer: Medicaid Other | Source: Ambulatory Visit | Attending: Radiation Oncology | Admitting: Radiation Oncology

## 2018-12-01 ENCOUNTER — Other Ambulatory Visit: Payer: Self-pay

## 2018-12-01 DIAGNOSIS — Z510 Encounter for antineoplastic radiation therapy: Secondary | ICD-10-CM | POA: Diagnosis not present

## 2018-12-02 ENCOUNTER — Ambulatory Visit
Admission: RE | Admit: 2018-12-02 | Discharge: 2018-12-02 | Disposition: A | Discharge status: Home / Self Care | Payer: Medicaid Other | Source: Ambulatory Visit | Attending: Radiation Oncology | Admitting: Radiation Oncology

## 2018-12-02 ENCOUNTER — Ambulatory Visit: Payer: Medicaid Other

## 2018-12-02 ENCOUNTER — Other Ambulatory Visit: Payer: Self-pay

## 2018-12-02 DIAGNOSIS — Z510 Encounter for antineoplastic radiation therapy: Secondary | ICD-10-CM | POA: Diagnosis not present

## 2018-12-03 ENCOUNTER — Ambulatory Visit: Payer: Medicaid Other

## 2018-12-03 ENCOUNTER — Other Ambulatory Visit: Payer: Self-pay

## 2018-12-03 ENCOUNTER — Ambulatory Visit
Admission: RE | Admit: 2018-12-03 | Discharge: 2018-12-03 | Disposition: A | Discharge status: Home / Self Care | Payer: Medicaid Other | Source: Ambulatory Visit | Attending: Radiation Oncology | Admitting: Radiation Oncology

## 2018-12-03 DIAGNOSIS — Z510 Encounter for antineoplastic radiation therapy: Secondary | ICD-10-CM | POA: Diagnosis not present

## 2018-12-06 ENCOUNTER — Ambulatory Visit: Payer: Medicaid Other

## 2018-12-06 ENCOUNTER — Ambulatory Visit
Admission: RE | Admit: 2018-12-06 | Discharge: 2018-12-06 | Disposition: A | Discharge status: Home / Self Care | Payer: Medicaid Other | Source: Ambulatory Visit | Attending: Radiation Oncology | Admitting: Radiation Oncology

## 2018-12-06 ENCOUNTER — Other Ambulatory Visit: Payer: Self-pay

## 2018-12-06 DIAGNOSIS — Z510 Encounter for antineoplastic radiation therapy: Secondary | ICD-10-CM | POA: Diagnosis not present

## 2018-12-07 ENCOUNTER — Encounter: Payer: Self-pay | Admitting: General Practice

## 2018-12-07 ENCOUNTER — Other Ambulatory Visit: Payer: Self-pay

## 2018-12-07 ENCOUNTER — Ambulatory Visit
Admission: RE | Admit: 2018-12-07 | Discharge: 2018-12-07 | Disposition: A | Discharge status: Home / Self Care | Payer: Medicaid Other | Source: Ambulatory Visit | Attending: Radiation Oncology | Admitting: Radiation Oncology

## 2018-12-07 ENCOUNTER — Ambulatory Visit: Payer: Medicaid Other

## 2018-12-07 DIAGNOSIS — Z510 Encounter for antineoplastic radiation therapy: Secondary | ICD-10-CM | POA: Diagnosis not present

## 2018-12-07 NOTE — Progress Notes (Signed)
[facility] CSW Progress Note  Call to patient for support.  Patient continues to experience significant depression, rates mood as 3 out of 10. Lowest lifetime mood rating was "2 - 3."  Denies suicidal ideation or plan.  Struggling w physical changes post mastectomy - frustrated that she has lost breast and cannot have reconstruction for at least 6 months.  Current radiation treatment has become increasingly fatiguing - reports she naps 6 -7 times/day, then has difficulty remaining asleep at night.  Falls asleep after taking Ambien, but wakes after 2 - 3 hours of sleep, sleeps fitfully the rest of night and gets up approx 5 AM.  Isolated due to current pandemic, unable to see mother who is major support person out of concern for transmitting virus.  Difficulty in getting outside for a walk, has old fracture in foot which causes pain w exercise.  Has difficulty w concentration so reading has been difficult.  Has had one visit w psychiatrist (Dr Modesta Messing at [facility]).  Felt like she was "asking too many questions", patient felt irritable.  Discussed need for thorough psychiatric exam in order to be able to help and prescribe appropriate medications.  Encouraged patient to take medications as prescribed (Ability and venlafaxine) as well as schedule follow up appt w provider.  Discussed various ways to care for self and help w depression - this can include mild exercise, sitting outside in nature, working on small garden project.  Will send patient copies of Toolkit for Thriving During Crisis which contains exercises for stress reduction and distraction.  Encouraged her to consider participating in virtual support group which is beginning at [facility] mid May .  Also sent information on online depression support group.  Patient reluctantly agreeable to plan for increasing social interaction, physical exercise and connection w others.  Wants to submit application to [employer] [place] for financial  assistance w medical bills, CSW will help her submit this application when patient provides documents needed.  Will continue to touch base w patient to encourage/support.  Asked patient to consider referral for [employer] guide, peer mentor for patients w breast cancer - not ready at this time, but will consider.    Edwyna Shell, LCSW Clinical Social Worker Phone:  [phone]

## 2018-12-07 NOTE — Progress Notes (Signed)
[facility] CSW Progress Notes  See full note filed today.   Edwyna Shell, LCSW Clinical Social Worker Phone:  [phone]

## 2018-12-08 ENCOUNTER — Ambulatory Visit: Payer: Medicaid Other

## 2018-12-08 ENCOUNTER — Other Ambulatory Visit: Payer: Self-pay

## 2018-12-08 ENCOUNTER — Ambulatory Visit
Admission: RE | Admit: 2018-12-08 | Discharge: 2018-12-08 | Disposition: A | Discharge status: Home / Self Care | Payer: Medicaid Other | Source: Ambulatory Visit | Attending: Radiation Oncology | Admitting: Radiation Oncology

## 2018-12-08 DIAGNOSIS — Z510 Encounter for antineoplastic radiation therapy: Secondary | ICD-10-CM | POA: Diagnosis not present

## 2018-12-09 ENCOUNTER — Ambulatory Visit: Payer: Medicaid Other

## 2018-12-09 ENCOUNTER — Encounter: Payer: Self-pay | Admitting: General Practice

## 2018-12-09 ENCOUNTER — Other Ambulatory Visit: Payer: Self-pay

## 2018-12-09 ENCOUNTER — Ambulatory Visit
Admission: RE | Admit: 2018-12-09 | Discharge: 2018-12-09 | Disposition: A | Discharge status: Home / Self Care | Payer: Medicaid Other | Source: Ambulatory Visit | Attending: Radiation Oncology | Admitting: Radiation Oncology

## 2018-12-09 DIAGNOSIS — Z510 Encounter for antineoplastic radiation therapy: Secondary | ICD-10-CM | POA: Diagnosis not present

## 2018-12-09 NOTE — Progress Notes (Signed)
[facility] CSW Progress Notes  [employer] [place] application submitted on patient behalf via secure email.  Patient to bring Cancer Care and Cancer Support community applications tomorrow for help w applying for financial help.  Edwyna Shell, LCSW Clinical Social Worker Phone:  [phone]

## 2018-12-10 ENCOUNTER — Ambulatory Visit
Admission: RE | Admit: 2018-12-10 | Discharge: 2018-12-10 | Disposition: A | Discharge status: Home / Self Care | Payer: Medicaid Other | Source: Ambulatory Visit | Attending: Radiation Oncology | Admitting: Radiation Oncology

## 2018-12-10 ENCOUNTER — Ambulatory Visit: Payer: Medicaid Other

## 2018-12-10 ENCOUNTER — Encounter: Payer: Self-pay | Admitting: General Practice

## 2018-12-10 ENCOUNTER — Other Ambulatory Visit: Payer: Self-pay

## 2018-12-10 DIAGNOSIS — Z171 Estrogen receptor negative status [ER-]: Secondary | ICD-10-CM | POA: Insufficient documentation

## 2018-12-10 DIAGNOSIS — Z510 Encounter for antineoplastic radiation therapy: Secondary | ICD-10-CM | POA: Insufficient documentation

## 2018-12-10 DIAGNOSIS — C50411 Malignant neoplasm of upper-outer quadrant of right female breast: Secondary | ICD-10-CM | POA: Insufficient documentation

## 2018-12-10 NOTE — Progress Notes (Signed)
[facility] CSW Progress Note  Met w patient to complete applications for Cancer Care and Cancer Support Community Transportation Assistance Grant.  Submitted applications via secure email.   , LCSW Clinical Social Worker Phone:  [phone]  

## 2018-12-13 ENCOUNTER — Other Ambulatory Visit: Payer: Self-pay

## 2018-12-13 ENCOUNTER — Ambulatory Visit
Admission: RE | Admit: 2018-12-13 | Discharge: 2018-12-13 | Disposition: A | Discharge status: Home / Self Care | Payer: Medicaid Other | Source: Ambulatory Visit | Attending: Radiation Oncology | Admitting: Radiation Oncology

## 2018-12-13 ENCOUNTER — Ambulatory Visit: Payer: Medicaid Other

## 2018-12-13 ENCOUNTER — Other Ambulatory Visit: Payer: Self-pay | Admitting: Oncology

## 2018-12-13 ENCOUNTER — Telehealth (HOSPITAL_COMMUNITY): Payer: Self-pay

## 2018-12-13 DIAGNOSIS — Z171 Estrogen receptor negative status [ER-]: Secondary | ICD-10-CM

## 2018-12-13 DIAGNOSIS — C50411 Malignant neoplasm of upper-outer quadrant of right female breast: Secondary | ICD-10-CM

## 2018-12-13 DIAGNOSIS — Z510 Encounter for antineoplastic radiation therapy: Secondary | ICD-10-CM | POA: Diagnosis not present

## 2018-12-13 MED ORDER — RADIAPLEXRX EX GEL
Freq: Once | CUTANEOUS | Status: AC
  Administered 2018-12-13: 10:00:00 via TOPICAL

## 2018-12-13 NOTE — Telephone Encounter (Signed)
Pt to be called by scheduler Arbutus Leas to discuss date and time of appt for echo.

## 2018-12-13 NOTE — Telephone Encounter (Signed)
-----   Message from Larey Dresser, MD sent at 12/13/2018 10:44 AM EDT ----- Regarding: RE: repeat ECHO and reload Herceptin? We will arrange for her to get repeat echo.   Deeksha Cotrell/Heather: Can one of you schedule?  ----- Message ----- From: Chauncey Cruel, MD Sent: 12/13/2018   8:18 AM EDT To: Larey Dresser, MD, Margaret Pyle, [facility], # Subject: RE: repeat ECHO and reload Herceptin?          I am not resuming trastuzumab until we have a repeat echo and it shows improved cardiac function. Am copying cards to help [place] get this done  Thanks!  GM ----- Message ----- From: Margaret Pyle, [facility] Sent: 12/10/2018   1:33 PM EDT To: Chauncey Cruel, MD, [facility] Subject: repeat ECHO and reload Herceptin?              Dr. Jana Hakim, I see above pt is scheduled to restart her herceptin on 5/6 after holding for a few months due to decrease in her EF on 3/16 ECHO.  Do we plan for a repeat ECHO prior to 5/6 treatment and do you want to reload or keep at her 6mg /kg when she restarts Herceptin?  Please "reply all" when you respond so all the pharmacist can see your recommendation.  Thanks, [employer]

## 2018-12-14 ENCOUNTER — Ambulatory Visit: Payer: Medicaid Other

## 2018-12-14 ENCOUNTER — Other Ambulatory Visit: Payer: Self-pay

## 2018-12-14 ENCOUNTER — Ambulatory Visit (HOSPITAL_COMMUNITY)
Admission: RE | Admit: 2018-12-14 | Discharge: 2018-12-14 | Disposition: A | Discharge status: Home / Self Care | Payer: Medicaid Other | Source: Ambulatory Visit | Attending: Oncology | Admitting: Oncology

## 2018-12-14 ENCOUNTER — Ambulatory Visit
Admission: RE | Admit: 2018-12-14 | Discharge: 2018-12-14 | Disposition: A | Discharge status: Home / Self Care | Payer: Medicaid Other | Source: Ambulatory Visit | Attending: Radiation Oncology | Admitting: Radiation Oncology

## 2018-12-14 DIAGNOSIS — K219 Gastro-esophageal reflux disease without esophagitis: Secondary | ICD-10-CM | POA: Insufficient documentation

## 2018-12-14 DIAGNOSIS — Z171 Estrogen receptor negative status [ER-]: Secondary | ICD-10-CM | POA: Diagnosis not present

## 2018-12-14 DIAGNOSIS — Z510 Encounter for antineoplastic radiation therapy: Secondary | ICD-10-CM | POA: Diagnosis not present

## 2018-12-14 DIAGNOSIS — C50411 Malignant neoplasm of upper-outer quadrant of right female breast: Secondary | ICD-10-CM | POA: Diagnosis present

## 2018-12-14 NOTE — Progress Notes (Signed)
[place]  [phone]) [phone] [phone]) [phone]    ID: Jacqueline Good DOB: 1961-03-05  MR#: [id number]  FXT#:[id number]  Patient Care Team: Jacqueline Pier, MD as PCP - General (Internal Medicine) Jacqueline Overall, MD as Consulting Physician (General Surgery) Jacqueline Good, Jacqueline Dad, MD as Consulting Physician (Oncology) Jacqueline Gibson, MD as Attending Physician (Radiation Oncology) Jacqueline Good, Jacqueline Lofty, DO as Attending Physician (Plastic Surgery) OTHER MD: Jacqueline Moat, PA-C [FAX [phone]]   CHIEF COMPLAINT: Estrogen receptor negative breast cancer  CURRENT TREATMENT: Trastuzumab, completing adjuvant radiation   INTERVAL HISTORY: Jacqueline Good returns today for follow-up and treatment of her estrogen receptor negative breast cancer.  Since her last visit here, she underwent repeat echocardiogram yesterday, 12/14/2018, which showed an ejection fraction in the 55-60% range. Her trastuzumab had been held due to low ejection fraction.  She underwent right breast expander removal on 10/26/2018. Pathology from the procedure ([id number]) showed no malignancy. On follow up with Dr. Marla Good on 11/18/2018, the incision site was healing well, and there were no signs of infection.  She is currently undergoing radiation therapy.  This is making her feel tired and she is having a little pain in the right axilla but otherwise she is tolerating it well.   REVIEW OF SYSTEMS: Jacqueline Good is very depressed.  She feels ugly, because of her breast problems and because of her hair, which is however coming in quite sickly.  She sits alone at home looking at the window all day with nothing to do.  She feels a significant financial strain.  Her mother is currently paying her rent.  Her mother lives next door.  She feels very guilty about this as well.  She is not exercising.  She is taking appropriate precautions regarding the pandemic.  A detailed review of systems today was otherwise stable    HISTORY OF CURRENT ILLNESS: From the original intake note:  Jacqueline Good had routine screening mammography on 02/05/2018 showing a possible abnormality in the right breast. She underwent unilateral right diagnostic mammography with tomography and right breast ultrasonography at [facility] on 02/15/2018 showing: Highly suspicious right breast mass at 10 o'clock position.  There was a second, 0.6 cm lesion at the 10:00 radiant which has not been biopsied.  Suspicious focal cortical thickening of a single right axillary lymph node.  Accordingly on 02/25/2018 she proceeded to biopsy of the right breast area in question. 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   The patient's subsequent history is as detailed below.    PAST MEDICAL HISTORY: Past Medical History:  Diagnosis Date  . Anemia   . Anxiety   . Arthritis    "all over" (08/12/2018)  . Asthma   . Breast cancer, right breast ([place]) dx'd 02/2018  . Chronic bronchitis ([place])   . Depression   . Family history of breast cancer   . Family history of prostate cancer   . Family history of uterine cancer   . GERD (gastroesophageal reflux disease)   . History of blood transfusion    "several; related to low blood" (08/12/2018)    PAST SURGICAL HISTORY: Past Surgical History:  Procedure Laterality Date  . AXILLARY LYMPH NODE DISSECTION Right 08/12/2018   Procedure: AXILLARY LYMPH NODE DISSECTION;  Surgeon: Jacqueline Overall, MD;  Location: [facility];  Service: General;  Laterality: Right;  . BREAST BIOPSY Right 03/2018  . BREAST RECONSTRUCTION WITH PLACEMENT OF TISSUE EXPANDER AND FLEX HD (ACELLULAR HYDRATED DERMIS) Right 08/12/2018   Procedure: RIGHT BREAST  RECONSTRUCTION WITH PLACEMENT OF TISSUE EXPANDER AND FLEX HD (ACELLULAR HYDRATED DERMIS);  Surgeon: Jacqueline Going, DO;  Location: [facility];  Service: Plastics;  Laterality: Right;  . DILATION AND CURETTAGE OF UTERUS    . ENDOMETRIAL ABLATION    . IR IMAGING GUIDED PORT INSERTION  03/18/2018  . MASTECTOMY MODIFIED RADICAL Right 08/12/2018   w/axillary LND  . MASTECTOMY MODIFIED RADICAL Right 08/12/2018   Procedure: RIGHT MODIFIED RADICAL MASTECTOMY;  Surgeon: Jacqueline Overall, MD;  Location: [facility];  Service: General;  Laterality: Right;  . MYOMECTOMY    . REMOVAL OF TISSUE EXPANDER AND PLACEMENT OF IMPLANT Right 10/26/2018   Procedure: Removal of right breast expander;  Surgeon: Jacqueline Going, DO;  Location: [facility];  Service: Plastics;  Laterality: Right;  75 min, please    FAMILY HISTORY: Family History  Problem Relation Age of Onset  . Lupus Sister   . Heart disease Sister   . Breast cancer Mother 62  . Prostate cancer Paternal Uncle   . Diabetes Maternal Grandmother   . Heart Problems Maternal Grandmother   . Prostate cancer Maternal Grandfather   . Prostate cancer Paternal Grandfather   . Prostate cancer Other        MGFs brother  . Breast cancer Other        Mat great-grandmother's sister  . Uterine cancer Maternal Great-grandmother   . Prostate cancer Other        PGFs brother   She notes that her father is currently 42 years old. Patients' mother is currently 27 years old and she was diagnosed with breast cancer at age 69. The patient has 4 brothers and 1 sister. She has a maternal aunt with breast cancer and her maternal great grandmother with had ovarian cancer.    GYNECOLOGIC HISTORY:  No LMP recorded. Patient is postmenopausal. Menarche: 58 years old Age at first live birth: 58 years old GX P: 1 LMP: 13-14 years ago s/p ablation Contraceptive: no HRT: no  Hysterectomy: no SO: no   SOCIAL HISTORY: She is currently unemployed.  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 She lives alone without pets. Her daughter is Jacqueline Good who lives in [place] and works in Therapist, art.  The patient has one grandchild. She goes to a [employer].   ADVANCED DIRECTIVES: Not in place.  At the 03/03/2018 visit the patient was given the appropriate documents to complete and notarized at her discretion   HEALTH MAINTENANCE: Social History   Tobacco Use  . Smoking status: Former Smoker    Packs/day: 1.00    Years: 38.00    Pack years: 38.00    Types: Cigarettes    Last attempt to quit: 05/15/2018    Years since quitting: 0.5  . Smokeless tobacco: Never Used  Substance Use Topics  . Alcohol use: Not Currently    Comment: 08/12/2018 "nothing since 04/2018"  . Drug use: No     Colonoscopy: Never  PAP:   Bone density: Never   Allergies  Allergen Reactions  . Gadolinium Derivatives Itching and Cough    Pt began sneezing and coughing as soon as MRI contrast was injected. Severe nasal congestion. No rash or hives no SOB.   Marland Kitchen Hydrocodone  Hives and Itching    Pt says she can take it with benadryl.   . Iodinated Diagnostic Agents Itching    Current Outpatient Medications  Medication Sig Dispense Refill  . albuterol (VENTOLIN HFA) 108 (90 Base) MCG/ACT inhaler INHALE 2 PUFFS INTO THE LUNGS EVERY 6 HOURS AS NEEDED FOR WHEEZING 18 g 2  . ALPRAZolam (XANAX) 1 MG tablet 1 po q 6 hr prn.  No more than 3.5 qd. 105 tablet 3  . ARIPiprazole (ABILIFY) 2 MG tablet Take 1 tablet (2 mg total) by mouth daily. (Patient taking differently: Take 2 mg by mouth every evening. ) 30 tablet 1  . busPIRone (BUSPAR) 30 MG tablet Take 1 tablet (30 mg total) by mouth 2 (two) times daily. 60 tablet 3  . dexamethasone (DECADRON) 4 MG tablet Take 2 tablets (8 mg total) by mouth 2 (two) times daily. Start the day before Taxotere. Take once the day after, then 2 times a day x 2d. 30 tablet 1  . diazepam (VALIUM) 2 MG tablet Take 1 tablet by mouth as  needed.    . diphenoxylate-atropine (LOMOTIL) 2.5-0.025 MG tablet Take 1 tablet by mouth 4 (four) times daily as needed for diarrhea or loose stools. 30 tablet 0  . fluticasone (FLONASE) 50 MCG/ACT nasal spray Place 2 sprays into both nostrils daily. (Patient taking differently: Place 2 sprays into both nostrils daily as needed for allergies. ) 1 g 0  . Fluticasone-Salmeterol (ADVAIR DISKUS) 250-50 MCG/DOSE AEPB INHALE 1 PUFF INTO THE LUNGS 2 TIMES DAILY. 60 each 2  . lidocaine-prilocaine (EMLA) cream Apply to affected area once (Patient taking differently: Apply 1 application topically daily as needed (prior to port being flushed.). ) 30 g 3  . loperamide (IMODIUM) 2 MG capsule Take 2 mg by mouth as needed for diarrhea or loose stools.    Marland Kitchen loratadine (CLARITIN) 10 MG tablet Take 10 mg by mouth daily as needed for allergies.    Marland Kitchen omeprazole (PRILOSEC) 40 MG capsule TAKE 1 CAPSULE BY MOUTH DAILY. 30 capsule 2  . propranolol (INDERAL) 40 MG tablet Take 40 mg by mouth daily as needed (heart palpitation.).     Marland Kitchen venlafaxine (EFFEXOR) 75 MG tablet Take 3 tablets (225 mg total) by mouth daily. Take 2 qd until 08/07/18 and then start 3 po qd. (Patient taking differently: Take 75 mg by mouth 2 (two) times daily. Take 2 qd until 08/07/18 and then start 3 po qd) 90 tablet 3  . zolpidem (AMBIEN) 10 MG tablet Take 1 tablet (10 mg total) by mouth at bedtime as needed for sleep (1/2-1 tab). (Patient taking differently: Take 10 mg by mouth at bedtime. ) 30 tablet 3   No current facility-administered medications for this visit.     OBJECTIVE: Middle-aged African-American woman who was tearful during today's visit  Vitals:   12/15/18 0900  BP: (!) 118/50  Pulse: (!) 58  Resp: 18  SpO2: 100%     Body mass index is 33.7 kg/m.   Wt Readings from Last 3 Encounters:  12/15/18 202 lb 8 oz (91.9 kg)  11/18/18 200 lb (90.7 kg)  11/05/18 200 lb (90.7 kg)  ECOG FS:1 - Symptomatic but completely ambulatory   Sclerae unicteric, EOMs intact Wearing a mask No cervical or supraclavicular adenopathy Lungs no rales or rhonchi Heart regular rate and rhythm Abd soft, nontender, positive bowel sounds MSK no focal spinal tenderness, no upper extremity lymphedema Neuro: nonfocal, well oriented, appropriate affect Breasts: The right breast  is status post mastectomy and is currently undergoing radiation.  The scar is irregular.  There is some hyperpigmentation.  There is minimal erythema.  There is no desquamation.  The left breast is benign.  Both axillae are benign.  10/13/2018 right breast      LAB RESULTS:  CMP     Component Value Date/Time   NA 142 12/15/2018 0815   K 3.8 12/15/2018 0815   CL 107 12/15/2018 0815   CO2 24 12/15/2018 0815   GLUCOSE 92 12/15/2018 0815   BUN 16 12/15/2018 0815   CREATININE 0.83 12/15/2018 0815   CREATININE 0.80 03/03/2018 0830   CREATININE 0.83 09/24/2016 1151   CALCIUM 9.3 12/15/2018 0815   PROT 7.2 12/15/2018 0815   ALBUMIN 3.7 12/15/2018 0815   AST 14 (L) 12/15/2018 0815   AST 15 03/03/2018 0830   ALT 14 12/15/2018 0815   ALT 16 03/03/2018 0830   ALKPHOS 84 12/15/2018 0815   BILITOT 0.5 12/15/2018 0815   BILITOT 0.5 03/03/2018 0830   GFRNONAA >60 12/15/2018 0815   GFRNONAA >60 03/03/2018 0830   GFRNONAA 79 09/24/2016 1151   GFRAA >60 12/15/2018 0815   GFRAA >60 03/03/2018 0830   GFRAA >89 09/24/2016 1151    No results found for: TOTALPROTELP, ALBUMINELP, A1GS, A2GS, BETS, BETA2SER, GAMS, MSPIKE, SPEI  No results found for: KPAFRELGTCHN, LAMBDASER, KAPLAMBRATIO  Lab Results  Component Value Date   WBC 5.2 12/15/2018   NEUTROABS 4.0 12/15/2018   HGB 10.9 (L) 12/15/2018   HCT 34.4 (L) 12/15/2018   MCV 90.5 12/15/2018   PLT 179 12/15/2018    _0 @  No results found for: LABCA2  No components found for: PJSRPR945  No results for input(s): INR in the last 168 hours.  No results found for: LABCA2  No results found for:  OPF292  No results found for: KMQ286  No results found for: NOT771  No results found for: CA2729  No components found for: HGQUANT  No results found for: CEA1 / No results found for: CEA1   No results found for: AFPTUMOR  No results found for: CHROMOGRNA  No results found for: PSA1  Appointment on 12/15/2018  Component Date Value Ref Range Status  . Sodium 12/15/2018 142  135 - 145 mmol/L Final  . Potassium 12/15/2018 3.8  3.5 - 5.1 mmol/L Final  . Chloride 12/15/2018 107  98 - 111 mmol/L Final  . CO2 12/15/2018 24  22 - 32 mmol/L Final  . Glucose, Bld 12/15/2018 92  70 - 99 mg/dL Final  . BUN 12/15/2018 16  6 - 20 mg/dL Final  . Creatinine, Ser 12/15/2018 0.83  0.44 - 1.00 mg/dL Final  . Calcium 12/15/2018 9.3  8.9 - 10.3 mg/dL Final  . Total Protein 12/15/2018 7.2  6.5 - 8.1 g/dL Final  . Albumin 12/15/2018 3.7  3.5 - 5.0 g/dL Final  . AST 12/15/2018 14* 15 - 41 U/L Final  . ALT 12/15/2018 14  0 - 44 U/L Final  . Alkaline Phosphatase 12/15/2018 84  38 - 126 U/L Final  . Total Bilirubin 12/15/2018 0.5  0.3 - 1.2 mg/dL Final  . GFR calc non Af Amer 12/15/2018 >60  >60 mL/min Final  . GFR calc Af Amer 12/15/2018 >60  >60 mL/min Final  . Anion gap 12/15/2018 11  5 - 15 Final   Performed at [facility], [place] [address] [place], [place] [address]  . WBC 12/15/2018 5.2  4.0 - 10.5 K/uL  Final  . RBC 12/15/2018 3.80* 3.87 - 5.11 MIL/uL Final  . Hemoglobin 12/15/2018 10.9* 12.0 - 15.0 g/dL Final  . HCT 12/15/2018 34.4* 36.0 - 46.0 % Final  . MCV 12/15/2018 90.5  80.0 - 100.0 fL Final  . MCH 12/15/2018 28.7  26.0 - 34.0 pg Final  . MCHC 12/15/2018 31.7  30.0 - 36.0 g/dL Final  . RDW 12/15/2018 15.5  11.5 - 15.5 % Final  . Platelets 12/15/2018 179  150 - 400 K/uL Final  . nRBC 12/15/2018 0.0  0.0 - 0.2 % Final  . Neutrophils Relative % 12/15/2018 78  % Final  . Neutro Abs 12/15/2018 4.0  1.7 - 7.7 K/uL Final  . Lymphocytes Relative 12/15/2018 12  %  Final  . Lymphs Abs 12/15/2018 0.6* 0.7 - 4.0 K/uL Final  . Monocytes Relative 12/15/2018 8  % Final  . Monocytes Absolute 12/15/2018 0.4  0.1 - 1.0 K/uL Final  . Eosinophils Relative 12/15/2018 2  % Final  . Eosinophils Absolute 12/15/2018 0.1  0.0 - 0.5 K/uL Final  . Basophils Relative 12/15/2018 0  % Final  . Basophils Absolute 12/15/2018 0.0  0.0 - 0.1 K/uL Final  . Immature Granulocytes 12/15/2018 0  % Final  . Abs Immature Granulocytes 12/15/2018 0.01  0.00 - 0.07 K/uL Final   Performed at [facility], [address] [place], [place] [address]    (this displays the last labs from the last 3 days)  No results found for: TOTALPROTELP, ALBUMINELP, A1GS, A2GS, BETS, BETA2SER, GAMS, MSPIKE, SPEI (this displays SPEP labs)  No results found for: KPAFRELGTCHN, LAMBDASER, KAPLAMBRATIO (kappa/lambda light chains)  No results found for: HGBA, HGBA2QUANT, HGBFQUANT, HGBSQUAN (Hemoglobinopathy evaluation)   No results found for: LDH  No results found for: IRON, TIBC, IRONPCTSAT (Iron and TIBC)  No results found for: FERRITIN  Urinalysis    Component Value Date/Time   COLORURINE CANCELED 09/24/2016 1151   APPEARANCEUR CANCELED 09/24/2016 1151   LABSPEC CANCELED 09/24/2016 1151   PHURINE CANCELED 09/24/2016 1151   GLUCOSEU CANCELED 09/24/2016 1151   HGBUR CANCELED 09/24/2016 1151   BILIRUBINUR CANCELED 09/24/2016 1151   KETONESUR CANCELED 09/24/2016 1151   PROTEINUR CANCELED 09/24/2016 1151   NITRITE CANCELED 09/24/2016 1151   LEUKOCYTESUR CANCELED 09/24/2016 1151     STUDIES:  Echocardiogram results reviewed with the patient  ELIGIBLE FOR AVAILABLE RESEARCH PROTOCOL:BCEP   ASSESSMENT: 58 y.o. [place] woman status post right breast upper outer quadrant biopsy 02/25/2018 for a clinical T2 pN1, stage IIB invasive ductal carcinoma, estrogen and progesterone receptor negative, HER-2 amplified, with an MIB-1 of 50%.  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 STK11, TP53, TSC1, TSC2, and VHL.  The following genes were evaluated for sequence changes only: SDHA and HOXB13 c.251G>A variant only.   (2) neoadjuvant chemotherapy consisting of carboplatin, docetaxel, trastuzumab and pertuzumab every 21 days x 6 starting 03/23/2018, completed 07/12/2018  (a) pertuzumab omitted after cycle 1 due to poorly-controlled diarrhea  (b) docetaxel discontinued after cycle 3 due to neuropathy; gemcitabine substituted  (  c) Granix substituted for Neulasta on days 3-6 following chemotherapy due to improved tolerance  (d) Switched back to Neulasta after cycles 5 and 6 per patient request  (3) anti-HER-2 treatment will continue for 12 months (through AUG 2020)  (a) baseline echocardiogram 03/16/2018 shows an ejection fraction in the 55-60% range.  (b) echo on 11/15 shows EF of 55-60%  (4) status post right modified radical mastectomy 08/12/2018 showing a complete pathologic response (ypT0 ypN0)  (a) a total of 11 regional lymph nodes removed  (b) expander in place  (5) adjuvant radiation to be completed 12/17/2018  (6) left adrenal adenoma measuring 2.9 cm on CT of the chest 03/16/2018   PLAN: Terilyn is still adjusting to her diagnosis and treatment.  She has a history of depression and is currently on 2 different antidepressants in addition to alprazolam.  Nevertheless she finds it very difficult to get through  each day.  It does not help that she lives by herself and that there are significant financial constraints, or that she is cooped up all the time because of the current pandemic.  The good news is that from a breast cancer point of view she does have a good prognosis.  We do not anticipate recurrence.  She is tolerating trastuzumab well except for the interruption due to mild drop in her ejection fraction, which has resolved.  We are resuming treatment today.  She will need a repeat echocardiogram in 2 to 3 months.  Hopefully we will be able to complete her Herceptin treatment sometime in September as planned  She is benefiting from interventions by our social worker and Candis Schatz.  I have asked [name] to call us with any other issues that may develop before the next visit with me, which will be in September     Meleana Commerford, Jacqueline Dad, MD  12/15/18 9:25 AM Medical Oncology and Hematology [facility] [place], [place] [address] Tel. [phone]    Fax. [phone]   I, Wilburn Mylar, am acting as scribe for Dr. Virgie Good. Zahirah Cheslock.  I, Lurline Del MD, have reviewed the above documentation for accuracy and completeness, and I agree with the above.

## 2018-12-14 NOTE — Progress Notes (Signed)
  Echocardiogram 2D Echocardiogram has been performed.  Darlina Sicilian M 12/14/2018, 9:32 AM

## 2018-12-15 ENCOUNTER — Inpatient Hospital Stay: Payer: Medicaid Other

## 2018-12-15 ENCOUNTER — Telehealth (HOSPITAL_COMMUNITY): Payer: Self-pay | Admitting: *Deleted

## 2018-12-15 ENCOUNTER — Ambulatory Visit: Payer: Medicaid Other

## 2018-12-15 ENCOUNTER — Inpatient Hospital Stay (HOSPITAL_BASED_OUTPATIENT_CLINIC_OR_DEPARTMENT_OTHER): Payer: Medicaid Other | Admitting: Oncology

## 2018-12-15 ENCOUNTER — Inpatient Hospital Stay: Payer: Medicaid Other | Attending: Oncology

## 2018-12-15 ENCOUNTER — Ambulatory Visit
Admission: RE | Admit: 2018-12-15 | Discharge: 2018-12-15 | Disposition: A | Discharge status: Home / Self Care | Payer: Medicaid Other | Source: Ambulatory Visit | Attending: Radiation Oncology | Admitting: Radiation Oncology

## 2018-12-15 ENCOUNTER — Other Ambulatory Visit: Payer: Self-pay

## 2018-12-15 VITALS — BP 118/50 | HR 58 | Resp 18 | Ht 65.0 in | Wt 202.5 lb

## 2018-12-15 DIAGNOSIS — Z8049 Family history of malignant neoplasm of other genital organs: Secondary | ICD-10-CM

## 2018-12-15 DIAGNOSIS — D3502 Benign neoplasm of left adrenal gland: Secondary | ICD-10-CM | POA: Diagnosis not present

## 2018-12-15 DIAGNOSIS — J441 Chronic obstructive pulmonary disease with (acute) exacerbation: Secondary | ICD-10-CM

## 2018-12-15 DIAGNOSIS — Z171 Estrogen receptor negative status [ER-]: Secondary | ICD-10-CM | POA: Diagnosis not present

## 2018-12-15 DIAGNOSIS — Z95828 Presence of other vascular implants and grafts: Secondary | ICD-10-CM

## 2018-12-15 DIAGNOSIS — C50411 Malignant neoplasm of upper-outer quadrant of right female breast: Secondary | ICD-10-CM | POA: Insufficient documentation

## 2018-12-15 DIAGNOSIS — Z510 Encounter for antineoplastic radiation therapy: Secondary | ICD-10-CM | POA: Diagnosis not present

## 2018-12-15 DIAGNOSIS — F329 Major depressive disorder, single episode, unspecified: Secondary | ICD-10-CM | POA: Diagnosis not present

## 2018-12-15 DIAGNOSIS — Z803 Family history of malignant neoplasm of breast: Secondary | ICD-10-CM

## 2018-12-15 DIAGNOSIS — Z9011 Acquired absence of right breast and nipple: Secondary | ICD-10-CM

## 2018-12-15 DIAGNOSIS — Z5112 Encounter for antineoplastic immunotherapy: Secondary | ICD-10-CM | POA: Insufficient documentation

## 2018-12-15 DIAGNOSIS — J45901 Unspecified asthma with (acute) exacerbation: Secondary | ICD-10-CM

## 2018-12-15 DIAGNOSIS — Z79899 Other long term (current) drug therapy: Secondary | ICD-10-CM

## 2018-12-15 LAB — COMPREHENSIVE METABOLIC PANEL
ALT: 14 U/L (ref 0–44)
AST: 14 U/L — ABNORMAL LOW (ref 15–41)
Albumin: 3.7 g/dL (ref 3.5–5.0)
Alkaline Phosphatase: 84 U/L (ref 38–126)
Anion gap: 11 (ref 5–15)
BUN: 16 mg/dL (ref 6–20)
CO2: 24 mmol/L (ref 22–32)
Calcium: 9.3 mg/dL (ref 8.9–10.3)
Chloride: 107 mmol/L (ref 98–111)
Creatinine, Ser: 0.83 mg/dL (ref 0.44–1.00)
GFR calc Af Amer: >60 mL/min (ref >60)
GFR calc non Af Amer: >60 mL/min (ref >60)
Glucose, Bld: 92 mg/dL (ref 70–99)
Potassium: 3.8 mmol/L (ref 3.5–5.1)
Sodium: 142 mmol/L (ref 135–145)
Total Bilirubin: 0.5 mg/dL (ref 0.3–1.2)
Total Protein: 7.2 g/dL (ref 6.5–8.1)

## 2018-12-15 LAB — CBC WITH DIFFERENTIAL/PLATELET
Abs Immature Granulocytes: 0.01 10*3/uL (ref 0.00–0.07)
Basophils Absolute: 0 10*3/uL (ref 0.0–0.1)
Basophils Relative: 0 %
Eosinophils Absolute: 0.1 10*3/uL (ref 0.0–0.5)
Eosinophils Relative: 2 %
HCT: 34.4 % — ABNORMAL LOW (ref 36.0–46.0)
Hemoglobin: 10.9 g/dL — ABNORMAL LOW (ref 12.0–15.0)
Immature Granulocytes: 0 %
Lymphocytes Relative: 12 %
Lymphs Abs: 0.6 10*3/uL — ABNORMAL LOW (ref 0.7–4.0)
MCH: 28.7 pg (ref 26.0–34.0)
MCHC: 31.7 g/dL (ref 30.0–36.0)
MCV: 90.5 fL (ref 80.0–100.0)
Monocytes Absolute: 0.4 10*3/uL (ref 0.1–1.0)
Monocytes Relative: 8 %
Neutro Abs: 4 10*3/uL (ref 1.7–7.7)
Neutrophils Relative %: 78 %
Platelets: 179 10*3/uL (ref 150–400)
RBC: 3.8 MIL/uL — ABNORMAL LOW (ref 3.87–5.11)
RDW: 15.5 % (ref 11.5–15.5)
WBC: 5.2 10*3/uL (ref 4.0–10.5)
nRBC: 0 % (ref 0.0–0.2)

## 2018-12-15 MED ORDER — DIPHENHYDRAMINE HCL 25 MG PO CAPS
ORAL_CAPSULE | ORAL | Status: AC
  Filled 2018-12-15: qty 1

## 2018-12-15 MED ORDER — SODIUM CHLORIDE 0.9% FLUSH
10.0000 mL | Freq: Once | INTRAVENOUS | Status: AC
  Administered 2018-12-15: 10 mL
  Filled 2018-12-15: qty 10

## 2018-12-15 MED ORDER — HEPARIN SOD (PORK) LOCK FLUSH 100 UNIT/ML IV SOLN
500.0000 [IU] | Freq: Once | INTRAVENOUS | Status: AC
  Administered 2018-12-15: 500 [IU] via INTRAVENOUS
  Filled 2018-12-15: qty 5

## 2018-12-15 MED ORDER — SODIUM CHLORIDE 0.9 % IV SOLN
Freq: Once | INTRAVENOUS | Status: AC
  Administered 2018-12-15: 10:00:00 via INTRAVENOUS
  Filled 2018-12-15: qty 250

## 2018-12-15 MED ORDER — ACETAMINOPHEN 325 MG PO TABS
ORAL_TABLET | ORAL | Status: AC
  Filled 2018-12-15: qty 2

## 2018-12-15 MED ORDER — SODIUM CHLORIDE 0.9% FLUSH
10.0000 mL | Freq: Once | INTRAVENOUS | Status: AC
  Administered 2018-12-15: 10 mL via INTRAVENOUS
  Filled 2018-12-15: qty 10

## 2018-12-15 MED ORDER — TRASTUZUMAB CHEMO 150 MG IV SOLR
600.0000 mg | Freq: Once | INTRAVENOUS | Status: AC
  Administered 2018-12-15: 600 mg via INTRAVENOUS
  Filled 2018-12-15: qty 28.57

## 2018-12-15 MED ORDER — ACETAMINOPHEN 325 MG PO TABS
650.0000 mg | ORAL_TABLET | Freq: Once | ORAL | Status: AC
  Administered 2018-12-15: 650 mg via ORAL

## 2018-12-15 MED ORDER — DIPHENHYDRAMINE HCL 25 MG PO CAPS
25.0000 mg | ORAL_CAPSULE | Freq: Once | ORAL | Status: AC
  Administered 2018-12-15: 10:00:00 25 mg via ORAL

## 2018-12-15 NOTE — Telephone Encounter (Signed)
-----   Message from Larey Dresser, MD sent at 12/14/2018  3:39 PM EDT ----- Talked with her.  Would repeat echo in 2 months.  Normal-appearing now, but prior echo with mildly low EF.

## 2018-12-15 NOTE — Telephone Encounter (Signed)
Order placed for repeat echo in July, will arrange

## 2018-12-15 NOTE — Patient Instructions (Signed)

## 2018-12-15 NOTE — Patient Instructions (Signed)
[facility] Discharge Instructions for Patients Receiving Chemotherapy  Today you received the following chemotherapy agents: Trastuzumab (Herceptin)  To help prevent nausea and vomiting after your treatment, we encourage you to take your nausea medication as directed.    If you develop nausea and vomiting that is not controlled by your nausea medication, call the clinic.   BELOW ARE SYMPTOMS THAT SHOULD BE REPORTED IMMEDIATELY:  *FEVER GREATER THAN 100.5 F  *CHILLS WITH OR WITHOUT FEVER  NAUSEA AND VOMITING THAT IS NOT CONTROLLED WITH YOUR NAUSEA MEDICATION  *UNUSUAL SHORTNESS OF BREATH  *UNUSUAL BRUISING OR BLEEDING  TENDERNESS IN MOUTH AND THROAT WITH OR WITHOUT PRESENCE OF ULCERS  *URINARY PROBLEMS  *BOWEL PROBLEMS  UNUSUAL RASH Items with * indicate a potential emergency and should be followed up as soon as possible.  Feel free to call the clinic should you have any questions or concerns. The clinic phone number is [phone].  Please show the CHEMO ALERT CARD at check-in to the Emergency Department and triage nurse.    

## 2018-12-15 NOTE — Progress Notes (Signed)
Virtual Visit via Video Note  I connected with Jacqueline Good on 12/20/18 at 11:30 AM EDT by a video enabled telemedicine application and verified that I am speaking with the correct person using two identifiers.   I discussed the limitations of evaluation and management by telemedicine and the availability of in person appointments. The patient expressed understanding and agreed to proceed.   I discussed the assessment and treatment plan with the patient. The patient was provided an opportunity to ask questions and all were answered. The patient agreed with the plan and demonstrated an understanding of the instructions.   The patient was advised to call back or seek an in-person evaluation if the symptoms worsen or if the condition fails to improve as anticipated.  I provided 25 minutes of non-face-to-face time during this encounter.   Norman Clay, MD    [facility] MD/PA/NP OP Progress Note  12/20/2018 12:09 PM Jacqueline Good  MRN:  [id number]  Chief Complaint:  Chief Complaint    Follow-up; Depression     HPI:  -Herceptin/trastumab is on hold due to decreased EF. She underwent radiation.  This is a follow up visit for depression.  She states that she has not been doing well.  She is concerned of catching corona virus. She feels tired and worn out. She has been "messing up"; she constantly does something to keep her self busy. She would repetitively clean closet, oven. She states that she needs to do something to feel less worthless. She states that she has been feeling worthless for a while due to the thing happened in the past (although she does not elaborate it). She has "fine" relationship with her mother. She tends to stay in the house most of the time. She has middle insomnia; she tends to check the door every night to make sure it is locked. She has difficulty in concentration. She has slightly increased appetite. She denies SI. She feels anxious, tense. She denies panic attacks.     206 lbs Wt Readings from Last 3 Encounters:  12/15/18 202 lb 8 oz (91.9 kg)  11/18/18 200 lb (90.7 kg)  11/05/18 200 lb (90.7 kg)     Visit Diagnosis:    ICD-10-CM   1. MDD (major depressive disorder), recurrent episode, moderate ([place]) F33.1     Past Psychiatric History: Please see initial evaluation for full details. I have reviewed the history. No updates at this time.     Past Medical History:  Past Medical History:  Diagnosis Date  . Anemia   . Anxiety   . Arthritis    "all over" (08/12/2018)  . Asthma   . Breast cancer, right breast ([place]) dx'd 02/2018  . Chronic bronchitis ([place])   . Depression   . Family history of breast cancer   . Family history of prostate cancer   . Family history of uterine cancer   . GERD (gastroesophageal reflux disease)   . History of blood transfusion    "several; related to low blood" (08/12/2018)    Past Surgical History:  Procedure Laterality Date  . AXILLARY LYMPH NODE DISSECTION Right 08/12/2018   Procedure: AXILLARY LYMPH NODE DISSECTION;  Surgeon: Alphonsa Overall, MD;  Location: [facility];  Service: General;  Laterality: Right;  . BREAST BIOPSY Right 03/2018  . BREAST RECONSTRUCTION WITH PLACEMENT OF TISSUE EXPANDER AND FLEX HD (ACELLULAR HYDRATED DERMIS) Right 08/12/2018   Procedure: RIGHT BREAST RECONSTRUCTION WITH PLACEMENT OF TISSUE EXPANDER AND FLEX HD (ACELLULAR HYDRATED DERMIS);  Surgeon: Marla Roe,  Loel Lofty, DO;  Location: [facility];  Service: Plastics;  Laterality: Right;  . DILATION AND CURETTAGE OF UTERUS    . ENDOMETRIAL ABLATION    . IR IMAGING GUIDED PORT INSERTION  03/18/2018  . MASTECTOMY MODIFIED RADICAL Right 08/12/2018   w/axillary LND  . MASTECTOMY MODIFIED RADICAL Right 08/12/2018   Procedure: RIGHT MODIFIED RADICAL MASTECTOMY;  Surgeon: Alphonsa Overall, MD;  Location: [facility];  Service: General;  Laterality: Right;  . MYOMECTOMY    . REMOVAL OF TISSUE EXPANDER AND PLACEMENT OF IMPLANT Right 10/26/2018   Procedure: Removal of right  breast expander;  Surgeon: Wallace Going, DO;  Location: [facility];  Service: Plastics;  Laterality: Right;  75 min, please    Family Psychiatric History: Please see initial evaluation for full details. I have reviewed the history. No updates at this time.     Family History:  Family History  Problem Relation Age of Onset  . Lupus Sister   . Heart disease Sister   . Breast cancer Mother 40  . Prostate cancer Paternal Uncle   . Diabetes Maternal Grandmother   . Heart Problems Maternal Grandmother   . Prostate cancer Maternal Grandfather   . Prostate cancer Paternal Grandfather   . Prostate cancer Other        MGFs brother  . Breast cancer Other        Mat great-grandmother's sister  . Uterine cancer Maternal Great-grandmother   . Prostate cancer Other        PGFs brother    Social History:  Social History   Socioeconomic History  . Marital status: Single    Spouse name: Not on file  . Number of children: Not on file  . Years of education: Not on file  . Highest education level: Not on file  Occupational History  . Not on file  Social Needs  . Financial resource strain: Not on file  . Food insecurity:    Worry: Not on file    Inability: Not on file  . Transportation needs:    Medical: No    Non-medical: No  Tobacco Use  . Smoking status: Former Smoker    Packs/day: 1.00    Years: 38.00    Pack years: 38.00    Types: Cigarettes    Last attempt to quit: 05/15/2018    Years since quitting: 0.6  . Smokeless tobacco: Never Used  Substance and Sexual Activity  . Alcohol use: Not Currently    Comment: 08/12/2018 "nothing since 04/2018"  . Drug use: No  . Sexual activity: Not Currently    Birth control/protection: None  Lifestyle  . Physical activity:    Days per week: Not on file    Minutes per session: Not on file  . Stress: Not on file  Relationships  . Social connections:    Talks on phone: Not on file    Gets together: Not on file    Attends religious  service: Not on file    Active member of club or organization: Not on file    Attends meetings of clubs or organizations: Not on file    Relationship status: Not on file  Other Topics Concern  . Not on file  Social History Narrative  . Not on file    Allergies:  Allergies  Allergen Reactions  . Gadolinium Derivatives Itching and Cough    Pt began sneezing and coughing as soon as MRI contrast was injected. Severe nasal congestion. No rash or hives  no SOB.   Marland Kitchen Hydrocodone Hives and Itching    Pt says she can take it with benadryl.   . Iodinated Diagnostic Agents Itching    Metabolic Disorder Labs: Lab Results  Component Value Date   HGBA1C 5.4 04/27/2014   No results found for: PROLACTIN Lab Results  Component Value Date   CHOL 159 09/24/2016   TRIG 179 (H) 09/24/2016   HDL 47 (L) 09/24/2016   CHOLHDL 3.4 09/24/2016   VLDL 36 (H) 09/24/2016   LDLCALC 76 09/24/2016   LDLCALC 86 04/27/2014   Lab Results  Component Value Date   TSH 0.58 09/24/2016   TSH 0.696 04/27/2014    Therapeutic Level Labs: No results found for: LITHIUM No results found for: VALPROATE No components found for:  CBMZ  Current Medications: Current Outpatient Medications  Medication Sig Dispense Refill  . albuterol (VENTOLIN HFA) 108 (90 Base) MCG/ACT inhaler INHALE 2 PUFFS INTO THE LUNGS EVERY 6 HOURS AS NEEDED FOR WHEEZING 18 g 2  . ALPRAZolam (XANAX) 1 MG tablet 1 po q 6 hr prn.  No more than 3.5 qd. 105 tablet 0  . ARIPiprazole (ABILIFY) 2 MG tablet Take 1 tablet (2 mg total) by mouth daily. (Patient taking differently: Take 2 mg by mouth every evening. ) 30 tablet 1  . ARIPiprazole (ABILIFY) 5 MG tablet Take 1 tablet (5 mg total) by mouth daily. 30 tablet 1  . dexamethasone (DECADRON) 4 MG tablet Take 2 tablets (8 mg total) by mouth 2 (two) times daily. Start the day before Taxotere. Take once the day after, then 2 times a day x 2d. 30 tablet 1  . diazepam (VALIUM) 2 MG tablet Take 1 tablet by  mouth as needed.    . diphenoxylate-atropine (LOMOTIL) 2.5-0.025 MG tablet Take 1 tablet by mouth 4 (four) times daily as needed for diarrhea or loose stools. 30 tablet 0  . fluticasone (FLONASE) 50 MCG/ACT nasal spray Place 2 sprays into both nostrils daily. (Patient taking differently: Place 2 sprays into both nostrils daily as needed for allergies. ) 1 g 0  . Fluticasone-Salmeterol (ADVAIR DISKUS) 250-50 MCG/DOSE AEPB INHALE 1 PUFF INTO THE LUNGS 2 TIMES DAILY. 60 each 2  . lidocaine-prilocaine (EMLA) cream Apply to affected area once (Patient taking differently: Apply 1 application topically daily as needed (prior to port being flushed.). ) 30 g 3  . loperamide (IMODIUM) 2 MG capsule Take 2 mg by mouth as needed for diarrhea or loose stools.    Marland Kitchen loratadine (CLARITIN) 10 MG tablet Take 10 mg by mouth daily as needed for allergies.    Marland Kitchen omeprazole (PRILOSEC) 40 MG capsule TAKE 1 CAPSULE BY MOUTH DAILY. 30 capsule 2  . propranolol (INDERAL) 40 MG tablet Take 40 mg by mouth daily as needed (heart palpitation.).     Marland Kitchen venlafaxine (EFFEXOR) 75 MG tablet Take 3 tablets (225 mg total) by mouth daily. 90 tablet 1  . zolpidem (AMBIEN) 10 MG tablet Take 1 tablet (10 mg total) by mouth at bedtime as needed for sleep (1/2-1 tab). 30 tablet 0   No current facility-administered medications for this visit.      Musculoskeletal: Strength & Muscle Tone: N/A Gait & Station: N/A Patient leans: N/A  Psychiatric Specialty Exam: Review of Systems  Psychiatric/Behavioral: Positive for depression. Negative for hallucinations, memory loss, substance abuse and suicidal ideas. The patient is nervous/anxious and has insomnia.   All other systems reviewed and are negative.   There were no vitals taken  for this visit.There is no height or weight on file to calculate BMI.  General Appearance: Fairly Groomed  Eye Contact:  Good  Speech:  Clear and Coherent  Volume:  Normal  Mood:  Anxious and Depressed  Affect:   Appropriate, Congruent, Restricted and Tearful  Thought Process:  Coherent  Orientation:  Full (Time, Place, and Person)  Thought Content: Logical   Suicidal Thoughts:  No  Homicidal Thoughts:  No  Memory:  Immediate;   Good  Judgement:  Good  Insight:  Present  Psychomotor Activity:  Normal  Concentration:  Concentration: Good and Attention Span: Good  Recall:  Good  Fund of Knowledge: Good  Language: Good  Akathisia:  No  Handed:  Right  AIMS (if indicated): not done  Assets:  Communication Skills Desire for Improvement  ADL's:  Intact  Cognition: WNL  Sleep:  Poor   Screenings: GAD-7     Office Visit from 04/22/2018 in [facility] Office Visit from 06/03/2017 in [facility] Office Visit from 09/24/2016 in [facility] Office Visit from 03/27/2016 in [facility]  Total GAD-7 Score  11  3  15  16     PHQ2-9     Office Visit from 04/22/2018 in [facility] Office Visit from 06/03/2017 in [facility] Office Visit from 09/24/2016 in [facility] Office Visit from 05/01/2016 in [facility] Office Visit from 03/27/2016 in [facility]  PHQ-2 Total Score  5  0  2  5  2   PHQ-9 Total Score  15  4  7  8  10        Assessment and Plan:  Jacqueline Good is a 58 y.o. year old female with a history of depression, OCD,, estrogen negative T2 pN1, stage IIB invasive ductal carcinoma, diagnosed 02/25/2018 s/p mastectomy 08/2018, neoadjuvant chemo immunotherapy/on trastzumab, asthma, OA, chronic back pain, , who presents for follow up appointment for MDD (major depressive disorder), recurrent episode, moderate ([place]). She is transferred from [facility]resa due to the provide unable to accept her insurance.   # MDD, moderate, recurrent without  psychotic features # r/o PTSD The patient continues to report depressive symptoms and anxiety in the context of medical condition of breast cancer and unemployment, although there was initially mild improvement in her mood after starting abilify.  She also reports some traumatic history at previous work, although she declined to elaborate it at the initial visit.  Will uptitrate Abilify as adjunctive treatment for depression.  Discussed potential metabolic side effect and EPS.  Will continue venlafaxine for depression.  Will continue Xanax as needed for anxiety; discussed potential risk of dependence and oversedation.  It is discussed that this medication may not be used for long-term given its potential risk of developing tolerance.  Will continue Ambien as needed for insomnia.  Discussed risk of oversedation.  Although she will greatly benefit from CBT, she declined this option.   Plan 1. Continue venlafaxine 225 mg daily 2. Increase Abilify 5 mg at night  3. Continue Xanax 1 mg three times a day (and additional 0.5 mg daily) as needed for anxiety  4. Continue Ambien 10 mg at night as needed for insomnia 5. Next appointment: 6/11 at 11: 30 for 30 mins, video  Past trials of medication: sertraline, fluoxetine, lexapro, venlafaxine,  duloxetine, bupropion, quetiapine  The patient demonstrates the following risk factors for suicide: Chronic risk factors for suicide include: psychiatric disorder of depression and medical illness of breast cancer. Acute risk factors for suicide include: unemployment and loss (financial, interpersonal, professional). Protective factors for this patient include: positive social support, responsibility to others (children, family), coping skills and hope for the future. Considering these factors, the overall suicide risk at this point appears to be low. Patient is appropriate for outpatient follow up.  The duration of this appointment visit was 25 minutes of non  face-to-face time with the patient.  Greater than 50% of this time was spent in counseling, explanation of  diagnosis, planning of further management, and coordination of care.   Norman Clay, MD 12/20/2018, 12:09 PM

## 2018-12-16 ENCOUNTER — Other Ambulatory Visit: Payer: Self-pay

## 2018-12-16 ENCOUNTER — Ambulatory Visit
Admission: RE | Admit: 2018-12-16 | Discharge: 2018-12-16 | Disposition: A | Discharge status: Home / Self Care | Payer: Medicaid Other | Source: Ambulatory Visit | Attending: Radiation Oncology | Admitting: Radiation Oncology

## 2018-12-16 ENCOUNTER — Ambulatory Visit: Payer: Medicaid Other

## 2018-12-16 DIAGNOSIS — Z510 Encounter for antineoplastic radiation therapy: Secondary | ICD-10-CM | POA: Diagnosis not present

## 2018-12-17 ENCOUNTER — Encounter: Payer: Self-pay | Admitting: Radiation Oncology

## 2018-12-17 ENCOUNTER — Encounter: Payer: Self-pay | Admitting: *Deleted

## 2018-12-17 ENCOUNTER — Ambulatory Visit: Payer: Medicaid Other

## 2018-12-17 ENCOUNTER — Ambulatory Visit
Admission: RE | Admit: 2018-12-17 | Discharge: 2018-12-17 | Disposition: A | Discharge status: Home / Self Care | Payer: Medicaid Other | Source: Ambulatory Visit | Attending: Radiation Oncology | Admitting: Radiation Oncology

## 2018-12-17 ENCOUNTER — Other Ambulatory Visit: Payer: Self-pay

## 2018-12-17 DIAGNOSIS — Z510 Encounter for antineoplastic radiation therapy: Secondary | ICD-10-CM | POA: Diagnosis not present

## 2018-12-20 ENCOUNTER — Telehealth: Payer: Self-pay | Admitting: Oncology

## 2018-12-20 ENCOUNTER — Ambulatory Visit: Payer: Medicaid Other

## 2018-12-20 ENCOUNTER — Other Ambulatory Visit: Payer: Self-pay

## 2018-12-20 ENCOUNTER — Encounter (HOSPITAL_COMMUNITY): Payer: Self-pay | Admitting: Psychiatry

## 2018-12-20 ENCOUNTER — Telehealth (HOSPITAL_COMMUNITY): Payer: Self-pay | Admitting: *Deleted

## 2018-12-20 ENCOUNTER — Ambulatory Visit (INDEPENDENT_AMBULATORY_CARE_PROVIDER_SITE_OTHER): Payer: Medicaid Other | Admitting: Psychiatry

## 2018-12-20 DIAGNOSIS — F331 Major depressive disorder, recurrent, moderate: Secondary | ICD-10-CM | POA: Diagnosis not present

## 2018-12-20 MED ORDER — ZOLPIDEM TARTRATE 10 MG PO TABS: 10.0000 mg | ORAL_TABLET | Freq: Every evening | ORAL | 0 refills | Status: DC | PRN

## 2018-12-20 MED ORDER — ARIPIPRAZOLE 5 MG PO TABS: 5.0000 mg | ORAL_TABLET | Freq: Every day | ORAL | 1 refills | Status: DC

## 2018-12-20 MED ORDER — VENLAFAXINE HCL 75 MG PO TABS: 225.0000 mg | ORAL_TABLET | Freq: Every day | ORAL | 1 refills | Status: DC

## 2018-12-20 MED ORDER — ALPRAZOLAM 1 MG PO TABS: ORAL_TABLET | ORAL | 0 refills | Status: DC

## 2018-12-20 MED FILL — VENLAFAXINE HCL 75 MG TAB: 75 | 30 days supply | Qty: 90 | Fill #0

## 2018-12-20 MED FILL — ALPRAZolam 1 MG TABS: 1 | 30 days supply | Qty: 105 | Fill #0

## 2018-12-20 MED FILL — NAPROXEN 500 MG TABLET: 500 | 30 days supply | Qty: 60 | Fill #1

## 2018-12-20 MED FILL — ARIPIPRAZOLE 5 MG TABS: 5 | 30 days supply | Qty: 30 | Fill #0

## 2018-12-20 NOTE — Telephone Encounter (Signed)
[facility] TRACK PRIOR AUTHORIZATION ARIPIPRAZOLE 5 MG TABS  P.A. # [id number]        GOOD THRU:   12/20/2018          12/15/2019

## 2018-12-20 NOTE — Patient Instructions (Addendum)
1. Continue venlafaxine 225 mg daily 2. Increase Abilify 5 mg at night  3. Continue Xanax 1 mg three times a day (and additional 0.5 mg daily) as needed for anxiety  4. Continue Ambien 10 mg at night as needed for insomnia 5. Next appointment: 6/11 at 11: 66

## 2018-12-21 ENCOUNTER — Telehealth: Payer: Self-pay | Admitting: Oncology

## 2018-12-21 NOTE — Telephone Encounter (Signed)
Scheduled appts per sch msg. Mailed printout °

## 2018-12-22 ENCOUNTER — Other Ambulatory Visit: Payer: Self-pay

## 2018-12-22 ENCOUNTER — Encounter: Payer: Self-pay | Admitting: Primary Care

## 2018-12-22 ENCOUNTER — Ambulatory Visit: Payer: Medicaid Other | Attending: Primary Care | Admitting: Primary Care

## 2018-12-22 DIAGNOSIS — Z79899 Other long term (current) drug therapy: Secondary | ICD-10-CM | POA: Insufficient documentation

## 2018-12-22 DIAGNOSIS — R093 Abnormal sputum: Secondary | ICD-10-CM | POA: Insufficient documentation

## 2018-12-22 DIAGNOSIS — R0602 Shortness of breath: Secondary | ICD-10-CM | POA: Diagnosis not present

## 2018-12-22 DIAGNOSIS — R05 Cough: Secondary | ICD-10-CM | POA: Diagnosis not present

## 2018-12-22 DIAGNOSIS — R531 Weakness: Secondary | ICD-10-CM | POA: Diagnosis not present

## 2018-12-22 DIAGNOSIS — F331 Major depressive disorder, recurrent, moderate: Secondary | ICD-10-CM | POA: Diagnosis not present

## 2018-12-22 DIAGNOSIS — R059 Cough, unspecified: Secondary | ICD-10-CM

## 2018-12-22 DIAGNOSIS — F411 Generalized anxiety disorder: Secondary | ICD-10-CM | POA: Insufficient documentation

## 2018-12-22 DIAGNOSIS — J302 Other seasonal allergic rhinitis: Secondary | ICD-10-CM

## 2018-12-22 DIAGNOSIS — R202 Paresthesia of skin: Secondary | ICD-10-CM | POA: Diagnosis not present

## 2018-12-22 DIAGNOSIS — R251 Tremor, unspecified: Secondary | ICD-10-CM | POA: Insufficient documentation

## 2018-12-22 DIAGNOSIS — R079 Chest pain, unspecified: Secondary | ICD-10-CM | POA: Insufficient documentation

## 2018-12-22 DIAGNOSIS — J45901 Unspecified asthma with (acute) exacerbation: Secondary | ICD-10-CM | POA: Diagnosis not present

## 2018-12-22 DIAGNOSIS — J029 Acute pharyngitis, unspecified: Secondary | ICD-10-CM | POA: Insufficient documentation

## 2018-12-22 DIAGNOSIS — R51 Headache: Secondary | ICD-10-CM | POA: Insufficient documentation

## 2018-12-22 DIAGNOSIS — J441 Chronic obstructive pulmonary disease with (acute) exacerbation: Secondary | ICD-10-CM

## 2018-12-22 MED ORDER — FLUTICASONE-SALMETEROL 250-50 MCG/DOSE IN AEPB: INHALATION_SPRAY | RESPIRATORY_TRACT | 2 refills | Status: DC

## 2018-12-22 MED ORDER — FLUTICASONE PROPIONATE 50 MCG/ACT NA SUSP: 2.0000 | Freq: Every day | NASAL | 1 refills | Status: DC | PRN

## 2018-12-22 MED ORDER — BENZONATATE 100 MG PO CAPS: 100.0000 mg | ORAL_CAPSULE | Freq: Three times a day (TID) | ORAL | 0 refills | Status: DC | PRN

## 2018-12-22 MED ORDER — LORATADINE 10 MG PO TABS: 10.0000 mg | ORAL_TABLET | Freq: Every day | ORAL | 11 refills | Status: AC | PRN

## 2018-12-22 MED FILL — BENZONATATE 100 MG CAPS: 100 | 7 days supply | Qty: 20 | Fill #0

## 2018-12-22 MED FILL — ADVAIR 250/50 DISKUS: 250-50 | 30 days supply | Qty: 60 | Fill #0

## 2018-12-22 MED FILL — FLUTICASONE PROP 50 MCG SPR: 50 | 30 days supply | Qty: 16 | Fill #0

## 2018-12-22 NOTE — Progress Notes (Signed)
Virtual Visit via Telephone Note  I connected with Jacqueline Good on 12/22/18 at 10:58 AM EDT by telephone and verified that I am speaking with the correct person using two identifiers.   I discussed the limitations, risks, security and privacy concerns of performing an evaluation and management service by telephone and the availability of in person appointments. I also discussed with the patient that there may be a patient responsible charge related to this service. The patient expressed understanding and agreed to proceed.   History of Present Illness: Jacqueline Good is c/o sore throat, congestion since last Thursday and productive cough. She has been taking her temperature every 2 hrs and afebrile. She is requesting abt's because she has a hx of bronchitis.    Observations/Objective: Review of Systems  Constitutional: Negative.   HENT: Positive for congestion and ear discharge.   Eyes: Negative.   Respiratory: Positive for sputum production and shortness of breath.   Cardiovascular: Positive for chest pain.       Muscle spasm every since mastectomy   Gastrointestinal: Negative.   Genitourinary: Negative.   Musculoskeletal: Negative.   Skin: Negative.   Neurological: Positive for tingling, tremors, weakness and headaches.  Endo/Heme/Allergies: Negative.   Psychiatric/Behavioral: Positive for depression.       Has a therapist     Assessment and Plan: Jacqueline Good was seen today for sore throat and cough.  Diagnoses and all orders for this visit:  MDD (major depressive disorder), recurrent episode, moderate (H[facility])  Generalized anxiety disorder Presently taking diazepam 2mg  prn and effexor currently controlled  Cough benzonatate (TESSALON PERLES) 100 MG capsule bid     Seasonal allergies Claritin and Flonase prescribed seasonal changes and high pollen   Asthma, chronic obstructive, with acute exacerbation (HCC) -     Fluticasone-Salmeterol (ADVAIR DISKUS) 250-50 MCG/DOSE AEPB;  INHALE 1 PUFF INTO THE LUNGS 2 TIMES DAILY.  Other orders -     loratadine (CLARITIN) 10 MG tablet; Take 1 tablet (10 mg total) by mouth daily as needed for allergies. -     fluticasone (FLONASE) 50 MCG/ACT nasal spray; Place 2 sprays into both nostrils daily as needed for up to 14 days for allergies. -     benzonatate (TESSALON PERLES) 100 MG capsule; Take 1 capsule (100 mg total) by mouth 3 (three) times daily as needed for cough.    Follow Up Instructions:    I discussed the assessment and treatment plan with the patient. The patient was provided an opportunity to ask questions and all were answered. The patient agreed with the plan and demonstrated an understanding of the instructions.   The patient was advised to call back or seek an in-person evaluation if the symptoms worsen or if the condition fails to improve as anticipated.  I provided 25 minutes of non-face-to-face time during this encounter.   Kerin Perna, NP

## 2018-12-22 NOTE — Progress Notes (Signed)
Pt states usually get bronchitis   Pt states she has been checking her temp every 2 hours and has not had a fever  Pt is requesting antibiotics or some cough medicine

## 2018-12-23 ENCOUNTER — Telehealth (HOSPITAL_COMMUNITY): Payer: Self-pay | Admitting: *Deleted

## 2018-12-23 NOTE — Telephone Encounter (Signed)
[facility] Approved   Zolpidem 10 mg Tab  P.A. # [id number]    GOOD THRU: 12/22/2018  UNTIL 11/04-2019

## 2019-01-05 ENCOUNTER — Other Ambulatory Visit: Payer: Self-pay | Admitting: *Deleted

## 2019-01-05 ENCOUNTER — Encounter: Payer: Self-pay | Admitting: *Deleted

## 2019-01-05 ENCOUNTER — Inpatient Hospital Stay: Payer: Medicaid Other

## 2019-01-05 ENCOUNTER — Telehealth: Payer: Self-pay | Admitting: *Deleted

## 2019-01-05 ENCOUNTER — Other Ambulatory Visit: Payer: Self-pay

## 2019-01-05 ENCOUNTER — Encounter: Payer: Self-pay | Admitting: General Practice

## 2019-01-05 VITALS — BP 116/78 | HR 59 | Temp 98.1°F | Resp 16 | Wt 206.4 lb

## 2019-01-05 DIAGNOSIS — C50411 Malignant neoplasm of upper-outer quadrant of right female breast: Secondary | ICD-10-CM

## 2019-01-05 DIAGNOSIS — Z95828 Presence of other vascular implants and grafts: Secondary | ICD-10-CM

## 2019-01-05 DIAGNOSIS — Z171 Estrogen receptor negative status [ER-]: Secondary | ICD-10-CM

## 2019-01-05 DIAGNOSIS — J441 Chronic obstructive pulmonary disease with (acute) exacerbation: Secondary | ICD-10-CM

## 2019-01-05 DIAGNOSIS — J45901 Unspecified asthma with (acute) exacerbation: Secondary | ICD-10-CM

## 2019-01-05 DIAGNOSIS — Z5112 Encounter for antineoplastic immunotherapy: Secondary | ICD-10-CM | POA: Diagnosis not present

## 2019-01-05 LAB — CBC WITH DIFFERENTIAL/PLATELET
Abs Immature Granulocytes: 0.02 10*3/uL (ref 0.00–0.07)
Basophils Absolute: 0 10*3/uL (ref 0.0–0.1)
Basophils Relative: 0 %
Eosinophils Absolute: 0.1 10*3/uL (ref 0.0–0.5)
Eosinophils Relative: 2 %
HCT: 33.4 % — ABNORMAL LOW (ref 36.0–46.0)
Hemoglobin: 10.7 g/dL — ABNORMAL LOW (ref 12.0–15.0)
Immature Granulocytes: 0 %
Lymphocytes Relative: 15 %
Lymphs Abs: 0.9 10*3/uL (ref 0.7–4.0)
MCH: 29 pg (ref 26.0–34.0)
MCHC: 32 g/dL (ref 30.0–36.0)
MCV: 90.5 fL (ref 80.0–100.0)
Monocytes Absolute: 0.4 10*3/uL (ref 0.1–1.0)
Monocytes Relative: 6 %
Neutro Abs: 4.5 10*3/uL (ref 1.7–7.7)
Neutrophils Relative %: 77 %
Platelets: 188 10*3/uL (ref 150–400)
RBC: 3.69 MIL/uL — ABNORMAL LOW (ref 3.87–5.11)
RDW: 14.5 % (ref 11.5–15.5)
WBC: 5.9 10*3/uL (ref 4.0–10.5)
nRBC: 0 % (ref 0.0–0.2)

## 2019-01-05 LAB — CMP (CANCER CENTER ONLY)
ALT: 19 U/L (ref 0–44)
AST: 14 U/L — ABNORMAL LOW (ref 15–41)
Albumin: 3.5 g/dL (ref 3.5–5.0)
Alkaline Phosphatase: 90 U/L (ref 38–126)
Anion gap: 10 (ref 5–15)
BUN: 25 mg/dL — ABNORMAL HIGH (ref 6–20)
CO2: 24 mmol/L (ref 22–32)
Calcium: 9.3 mg/dL (ref 8.9–10.3)
Chloride: 107 mmol/L (ref 98–111)
Creatinine: 0.86 mg/dL (ref 0.44–1.00)
GFR, Est AFR Am: >60 mL/min (ref >60)
GFR, Estimated: >60 mL/min (ref >60)
Glucose, Bld: 106 mg/dL — ABNORMAL HIGH (ref 70–99)
Potassium: 3.7 mmol/L (ref 3.5–5.1)
Sodium: 141 mmol/L (ref 135–145)
Total Bilirubin: 0.3 mg/dL (ref 0.3–1.2)
Total Protein: 7 g/dL (ref 6.5–8.1)

## 2019-01-05 MED ORDER — ACETAMINOPHEN 325 MG PO TABS
650.0000 mg | ORAL_TABLET | Freq: Once | ORAL | Status: AC
  Administered 2019-01-05: 650 mg via ORAL

## 2019-01-05 MED ORDER — HEPARIN SOD (PORK) LOCK FLUSH 100 UNIT/ML IV SOLN
500.0000 [IU] | Freq: Once | INTRAVENOUS | Status: AC | PRN
  Administered 2019-01-05: 11:00:00 500 [IU]
  Filled 2019-01-05: qty 5

## 2019-01-05 MED ORDER — SODIUM CHLORIDE 0.9 % IV SOLN
Freq: Once | INTRAVENOUS | Status: AC
  Administered 2019-01-05: 09:00:00 via INTRAVENOUS
  Filled 2019-01-05: qty 250

## 2019-01-05 MED ORDER — DIPHENHYDRAMINE HCL 25 MG PO CAPS
25.0000 mg | ORAL_CAPSULE | Freq: Once | ORAL | Status: AC
  Administered 2019-01-05: 09:00:00 25 mg via ORAL

## 2019-01-05 MED ORDER — ACETAMINOPHEN 325 MG PO TABS
ORAL_TABLET | ORAL | Status: AC
  Filled 2019-01-05: qty 2

## 2019-01-05 MED ORDER — DIPHENHYDRAMINE HCL 25 MG PO CAPS
ORAL_CAPSULE | ORAL | Status: AC
  Filled 2019-01-05: qty 1

## 2019-01-05 MED ORDER — TRASTUZUMAB CHEMO 150 MG IV SOLR
600.0000 mg | Freq: Once | INTRAVENOUS | Status: AC
  Administered 2019-01-05: 600 mg via INTRAVENOUS
  Filled 2019-01-05: qty 28.57

## 2019-01-05 MED ORDER — SODIUM CHLORIDE 0.9% FLUSH
10.0000 mL | INTRAVENOUS | Status: DC | PRN
  Administered 2019-01-05: 11:00:00 10 mL
  Filled 2019-01-05: qty 10

## 2019-01-05 MED ORDER — SODIUM CHLORIDE 0.9% FLUSH
10.0000 mL | Freq: Once | INTRAVENOUS | Status: AC
  Administered 2019-01-05: 10 mL
  Filled 2019-01-05: qty 10

## 2019-01-05 NOTE — Progress Notes (Signed)
[facility] CSW Progress Notes  Per email forwarded by [facility], Jacqueline Good has been approved for a $500 grant from [employer] in [place] to assist with her eligible medical bills/co-pays  Edwyna Shell, [facility] Worker Phone:  [phone]

## 2019-01-05 NOTE — Telephone Encounter (Signed)
This RN was contacted by treatment room nurse that pt is asking for something for " chest spasms "- she informed treatment room nurse she was  previously given valium per surgeon.  PT discussed with patient per current use of other benzodiazepines   Noted per chart review pt is still under reconstruction therapy with expander currently placed with Dr Marla Roe.  This RN called to discuss above with pt - obtained identified VM- message left requesting pt due to above she needs to contact Dr Marla Roe for chest spasms.

## 2019-01-05 NOTE — Patient Instructions (Signed)
[facility] Discharge Instructions for Patients Receiving Chemotherapy  Today you received the following chemotherapy agents Trastuzumab (HERCEPTIN).  To help prevent nausea and vomiting after your treatment, we encourage you to take your nausea medication as prescribed.  If you develop nausea and vomiting that is not controlled by your nausea medication, call the clinic.   BELOW ARE SYMPTOMS THAT SHOULD BE REPORTED IMMEDIATELY:  *FEVER GREATER THAN 100.5 F  *CHILLS WITH OR WITHOUT FEVER  NAUSEA AND VOMITING THAT IS NOT CONTROLLED WITH YOUR NAUSEA MEDICATION  *UNUSUAL SHORTNESS OF BREATH  *UNUSUAL BRUISING OR BLEEDING  TENDERNESS IN MOUTH AND THROAT WITH OR WITHOUT PRESENCE OF ULCERS  *URINARY PROBLEMS  *BOWEL PROBLEMS  UNUSUAL RASH Items with * indicate a potential emergency and should be followed up as soon as possible.  Feel free to call the clinic should you have any questions or concerns. The clinic phone number is [phone].  Please show the CHEMO ALERT CARD at check-in to the Emergency Department and triage nurse.  Coronavirus (COVID-19) Are you at risk?  Are you at risk for the Coronavirus (COVID-19)?  To be considered HIGH RISK for Coronavirus (COVID-19), you have to meet the following criteria:  . Traveled to [place], [place], [place], [place] or [place]; or in the [place] to [place], [place], [place], or [place]; and have fever, cough, and shortness of breath within the last 2 weeks of travel OR . Been in close contact with a person diagnosed with COVID-19 within the last 2 weeks and have fever, cough, and shortness of breath . IF YOU DO NOT MEET THESE CRITERIA, YOU ARE CONSIDERED LOW RISK FOR COVID-19.  What to do if you are HIGH RISK for COVID-19?  . If you are having a medical emergency, call 911. . Seek medical care right away. Before you go to a doctor's office, urgent care or emergency department, call ahead and tell them  about your recent travel, contact with someone diagnosed with COVID-19, and your symptoms. You should receive instructions from your physician's office regarding next steps of care.  . When you arrive at healthcare provider, tell the healthcare staff immediately you have returned from visiting [place], [place], [place], [place] or [place]; or traveled in the [place] to [place], [place], [place], or [place]; in the last two weeks or you have been in close contact with a person diagnosed with COVID-19 in the last 2 weeks.   . Tell the health care staff about your symptoms: fever, cough and shortness of breath. . After you have been seen by a medical provider, you will be either: o Tested for (COVID-19) and discharged home on quarantine except to seek medical care if symptoms worsen, and asked to  - Stay home and avoid contact with others until you get your results (4-5 days)  - Avoid travel on public transportation if possible (such as bus, train, or airplane) or o Sent to the Emergency Department by EMS for evaluation, COVID-19 testing, and possible admission depending on your condition and test results.  What to do if you are LOW RISK for COVID-19?  Reduce your risk of any infection by using the same precautions used for avoiding the common cold or flu:  . Wash your hands often with soap and warm water for at least 20 seconds.  If soap and water are not readily available, use an alcohol-based hand sanitizer with at least 60% alcohol.  . If coughing or sneezing,   cover your mouth and nose by coughing or sneezing into the elbow areas of your shirt or coat, into a tissue or into your sleeve (not your hands). . Avoid shaking hands with others and consider head nods or verbal greetings only. . Avoid touching your eyes, nose, or mouth with unwashed hands.  . Avoid close contact with people who are sick. . Avoid places or events with large numbers of people in one location, like concerts or  sporting events. . Carefully consider travel plans you have or are making. . If you are planning any travel outside or inside the [place], visit the CDC's Travelers' Health webpage for the latest health notices. . If you have some symptoms but not all symptoms, continue to monitor at home and seek medical attention if your symptoms worsen. . If you are having a medical emergency, call 911.   ADDITIONAL HEALTHCARE OPTIONS FOR PATIENTS  [facility] / e-Visit: [email or web address]         [facility]: [phone]  [facility]: [phone]                   [facility]: [phone]   

## 2019-01-07 ENCOUNTER — Other Ambulatory Visit: Payer: Self-pay

## 2019-01-07 ENCOUNTER — Telehealth: Payer: Self-pay | Admitting: Plastic Surgery

## 2019-01-07 ENCOUNTER — Telehealth: Payer: Self-pay

## 2019-01-07 DIAGNOSIS — Z171 Estrogen receptor negative status [ER-]: Secondary | ICD-10-CM

## 2019-01-07 DIAGNOSIS — C50411 Malignant neoplasm of upper-outer quadrant of right female breast: Secondary | ICD-10-CM

## 2019-01-07 NOTE — Telephone Encounter (Signed)
Received call from Elmyra Ricks with [facility] at [facility]. She states that the patient complained of chest spasms after chemotherapy and requested a prescription of Valium as well as therapy. She was informed by a nurse at the cancer center that they would need to contact Dr. Marla Roe for the Valium prescription as well as therapy since patient is still under the care of Dr. Marla Roe. Requested call back from clinical staff to discuss. Elmyra Ricks: [phone]

## 2019-01-07 NOTE — Telephone Encounter (Signed)
Received a call back from Bryant regarding the message below.  She stated that the patient was requesting to have PT for the chest spasms.  She said she wanted to make sure that PT was appropriate for the patient since she has implants.  Informed Elmyra Ricks I will speak with Dr. Marla Roe and give her a call back.  Elmyra Ricks verbalized understanding and agreed.  Dr. Marla Roe was informed of the message and per Dr. Marla Roe patient can have direct PT toward what is appropriate for her.  Called Elmyra Ricks back and informed her of what Dr. Marla Roe said.  Elmyra Ricks verbalized understanding and agreed.  Asked Elmyra Ricks was the patient wanting a refill on the Valium as well.  She stated no.  She said the patient was only asking for PT, we informed her that she will have to call Dr. Marla Roe for the refill on the Valium.//AB/CMA

## 2019-01-07 NOTE — Telephone Encounter (Signed)
Pt called regarding her report yesterday of chest spasms.   Pt states she was told that "Mendel Ryder would order some sort of therapy...". Explained to pt she may or may not be appropriate for Cancer Rehab PT. Spoke with Angie at Dr Eusebio Friendly office and confirmed pt appropriate for Cancer Rehab PT. Order placed and pt notified that someone would be calling her to schedule this.

## 2019-01-11 ENCOUNTER — Encounter: Payer: Self-pay | Admitting: Physical Therapy

## 2019-01-11 ENCOUNTER — Ambulatory Visit: Payer: Medicaid Other | Attending: Oncology | Admitting: Physical Therapy

## 2019-01-11 ENCOUNTER — Other Ambulatory Visit: Payer: Self-pay

## 2019-01-11 DIAGNOSIS — I972 Postmastectomy lymphedema syndrome: Secondary | ICD-10-CM | POA: Insufficient documentation

## 2019-01-11 DIAGNOSIS — M25611 Stiffness of right shoulder, not elsewhere classified: Secondary | ICD-10-CM

## 2019-01-11 DIAGNOSIS — G8929 Other chronic pain: Secondary | ICD-10-CM

## 2019-01-11 DIAGNOSIS — M25511 Pain in right shoulder: Secondary | ICD-10-CM | POA: Insufficient documentation

## 2019-01-11 DIAGNOSIS — L599 Disorder of the skin and subcutaneous tissue related to radiation, unspecified: Secondary | ICD-10-CM

## 2019-01-11 DIAGNOSIS — R293 Abnormal posture: Secondary | ICD-10-CM | POA: Insufficient documentation

## 2019-01-11 NOTE — Therapy (Signed)
[place], [place], [address] Phone: [phone]   Fax:  [phone]  Physical Therapy Evaluation  Patient Details  Name: Jacqueline Good MRN: [id number] Date of Birth: September 22, 1960 Referring Provider (PT): [name]   Encounter Date: 01/11/2019  PT End of Session - 01/11/19 1110    Visit Number  1    Number of Visits  4    Date for PT Re-Evaluation  02/08/19    Authorization Type  Medicaid- sending auth for 3 visits in 30 day period    PT Start Time  1033    PT Stop Time  1110    PT Time Calculation (min)  37 min    Activity Tolerance  Patient tolerated treatment well    Behavior During Therapy  [facility] for tasks assessed/performed       Past Medical History:  Diagnosis Date  . Anemia   . Anxiety   . Arthritis    "all over" (08/12/2018)  . Asthma   . Breast cancer, right breast ([place]) dx'd 02/2018  . Chronic bronchitis ([place])   . Depression   . Family history of breast cancer   . Family history of prostate cancer   . Family history of uterine cancer   . GERD (gastroesophageal reflux disease)   . History of blood transfusion    "several; related to low blood" (08/12/2018)    Past Surgical History:  Procedure Laterality Date  . AXILLARY LYMPH NODE DISSECTION Right 08/12/2018   Procedure: AXILLARY LYMPH NODE DISSECTION;  Surgeon: Alphonsa Overall, MD;  Location: [facility];  Service: General;  Laterality: Right;  . BREAST BIOPSY Right 03/2018  . BREAST RECONSTRUCTION WITH PLACEMENT OF TISSUE EXPANDER AND FLEX HD (ACELLULAR HYDRATED DERMIS) Right 08/12/2018   Procedure: RIGHT BREAST RECONSTRUCTION WITH PLACEMENT OF TISSUE EXPANDER AND FLEX HD (ACELLULAR HYDRATED DERMIS);  Surgeon: Wallace Going, DO;  Location: [facility];  Service: Plastics;  Laterality: Right;  . DILATION AND CURETTAGE OF UTERUS    . ENDOMETRIAL ABLATION    . IR IMAGING GUIDED PORT INSERTION  03/18/2018  . MASTECTOMY MODIFIED RADICAL Right  08/12/2018   w/axillary LND  . MASTECTOMY MODIFIED RADICAL Right 08/12/2018   Procedure: RIGHT MODIFIED RADICAL MASTECTOMY;  Surgeon: Alphonsa Overall, MD;  Location: [facility];  Service: General;  Laterality: Right;  . MYOMECTOMY    . REMOVAL OF TISSUE EXPANDER AND PLACEMENT OF IMPLANT Right 10/26/2018   Procedure: Removal of right breast expander;  Surgeon: Wallace Going, DO;  Location: [facility];  Service: Plastics;  Laterality: Right;  75 min, please    There were no vitals filed for this visit.   Subjective Assessment - 01/11/19 1036    Subjective  I have chest spasms that have been there since the mastectomy. I had to have my expander removed 10/26/18 because it kept getting infected. The pain in the breast got better after the expander was removed but the spasms continued. I have completed radiation and chemo. I am still taking Herceptin.     Pertinent History  Patient was diagnosed on 02/04/18 with right invasive ductal carcinoma breast cancer. It measures 3.1 cm and is located in the upper outer quadrant. It is ER/PR negative and HER2 positive with a Ki67 of 50%. She has a known positive axillary lymph node. She has no other medical problems but does smoke 1 pack per day., 08/12/18- right mastectomy with ALND, 10/26/18- pt had expander removed because of infection, she has completed  chemo and radiation    Patient Stated Goals  to get rid of these spasms    Currently in Pain?  Yes    Pain Score  5     Pain Location  Chest    Pain Orientation  Right    Pain Descriptors / Indicators  Discomfort;Sore    Pain Type  Chronic pain    Pain Onset  1 to 4 weeks ago    Pain Frequency  Constant    Aggravating Factors   nothing    Pain Relieving Factors  nothing    Effect of Pain on Daily Activities  no effect         [facility] PT Assessment - 01/11/19 0001      Assessment   Medical Diagnosis  right breast cancer    Referring Provider (PT)  [name]    Onset Date/Surgical Date  10/26/18    Hand  Dominance  Right    Prior Therapy  none      Precautions   Precautions  None    Precaution Comments  at risk for lymphedema      Restrictions   Weight Bearing Restrictions  No      Balance Screen   Has the patient fallen in the past 6 months  No    Has the patient had a decrease in activity level because of a fear of falling?   No    Is the patient reluctant to leave their home because of a fear of falling?   No      Home Environment   Living Environment  Private residence    Living Arrangements  Alone    Available Help at Discharge  Family    Type of [place]      Prior Function   Level of [place] with basic ADLs   needs assist with cleaning   Vocation  Unemployed    Leisure  She does not exercise      Cognition   Overall Cognitive Status  Within Functional Limits for tasks assessed      Observation/Other Assessments   Observations  pt uncomfortable sitting in chair due to chest spasms    Skin Integrity  swelling in right chest visible      Posture/Postural Control   Posture/Postural Control  Postural limitations    Postural Limitations  Rounded Shoulders;Forward head      ROM / Strength   AROM / PROM / Strength  AROM      AROM   Right Shoulder Flexion  116 Degrees    Right Shoulder ABduction  89 Degrees    Right Shoulder Internal Rotation  66 Degrees    Right Shoulder External Rotation  85 Degrees    Left Shoulder Flexion  165 Degrees    Left Shoulder ABduction  168 Degrees    Left Shoulder Internal Rotation  66 Degrees    Left Shoulder External Rotation  85 Degrees        LYMPHEDEMA/ONCOLOGY QUESTIONNAIRE - 01/11/19 1053      Type   Cancer Type  Right breast      Surgeries   Mastectomy Date  08/12/18    Axillary Lymph Node Dissection Date  08/12/18    Number Lymph Nodes Removed  --   pt is unsure     Date Lymphedema/Swelling Started   Date  10/26/18      Treatment   Active Chemotherapy Treatment  No    Past Chemotherapy  Treatment  Yes    Active Radiation Treatment  No    Past Radiation Treatment  Yes    Current Hormone Treatment  No    Past Hormone Therapy  No      What other symptoms do you have   Are you Having Heaviness or Tightness  Yes    Are you having Pain  Yes    Are you having pitting edema  No    Is it Hard or Difficult finding clothes that fit  No    Do you have infections  Yes    Comments  had to have expander removed due to infection      Right Upper Extremity Lymphedema   10 cm Proximal to Olecranon Process  31 cm    Olecranon Process  25.7 cm    10 cm Proximal to Ulnar Styloid Process  21.8 cm    Just Proximal to Ulnar Styloid Process  15.5 cm    Across Hand at [employer]  18 cm    At [place] of 2nd Digit  6.1 cm      Left Upper Extremity Lymphedema   10 cm Proximal to Olecranon Process  30.3 cm    Olecranon Process  24.5 cm    10 cm Proximal to Ulnar Styloid Process  19 cm    Just Proximal to Ulnar Styloid Process  14.8 cm    Across Hand at [employer]  17.5 cm    At [place] of 2nd Digit  6.5 cm             Objective measurements completed on examination: See above findings.      [facility] Adult PT Treatment/Exercise - 01/11/19 0001      Exercises   Exercises  Shoulder      Shoulder Exercises: Supine   Other Supine Exercises  Educated pt to lie in supine with R arm outstretched at approx 90 degrees to allow her pec to stretch and breathe and when she no longer feels a stretch to move her arm up a little further                  PT Long Term Goals - 01/11/19 1120      PT LONG TERM GOAL #1   Title  Pt will report a 75% improvement in right chest pain to decrease discomfort caused by spasms.    Time  4    Period  Weeks    Status  New    Target Date  02/08/19      PT LONG TERM GOAL #2   Title  Pt will be independent in a home exercise program for continued strengthening and stretching    Time  4    Period  Weeks    Status  New    Target Date   02/08/19      PT LONG TERM GOAL #3   Title  Pt will report a 25% improvement in swelling in right chest to allow improved comfort.    Time  4    Period  Weeks    Status  New    Target Date  02/08/19      PT LONG TERM GOAL #4   Title  Pt will demonstrate 150 degrees of right shoulder flexion to allow her to reach overhead.    Baseline  116    Time  4    Period  Weeks    Status  New  Target Date  02/08/19      PT LONG TERM GOAL #5   Title  Pt will demonstrate 140 degrees of right shoulder abduction to allow her to reach out to the side.    Baseline  89    Time  4    Period  Weeks    Status  New    Target Date  02/08/19             Plan - 01/11/19 1112    Clinical Impression Statement  Pt underwent a right mastectomy for treatment of right breast cancer on 08/12/18. She had to have her expander removed on 10/26/18 due to recurrent infections. She has completed chemotherapy and radiation. She has been having consistent chest spasms since her mastectomy which make her want to put pressure on her chest. She has developed some swelling in her right chest since removal of expander. This area is very sensitive to the touch and numerous knots can be palpated. She has severely limited right shoulder ROM which she reports worsened since radiation. Her R forearm measures larger than left and will monitor this for lymphedema. Pt would benefit from skilled PT services to increase right shoulder ROM, decrease right chest pain and spasms, improve scar mobility and monitor R UE for signs of lymphedema.    Personal Factors and Comorbidities  Comorbidity 1;Comorbidity 2;Other    Comorbidities  depression, anxiety, lives alone    Examination-Activity Limitations  Reach Overhead;Carry    Examination-Participation Restrictions  Cleaning    Stability/Clinical Decision Making  Evolving/Moderate complexity    Clinical Decision Making  Moderate    Rehab Potential  Good    Clinical Impairments Affecting  Rehab Potential  hx of radiation, hx of infections in right chest    PT Frequency  Other (comment)   3 visits/ auth   PT Duration  4 weeks    PT Treatment/Interventions  ADLs/Self Care Home Management;Therapeutic exercise;Patient/family education;Orthotic Fit/Training;Manual techniques;Manual lymph drainage;Scar mobilization;Passive range of motion;Taping;Joint Manipulations    PT Next Visit Plan  begin gentle PROM to R shoulder, gentle STM to R chest, scar massage R chest, give supine dowel ex    PT Home Exercise Plan  lying on back with arm outstretched moving it towards head to stretch pec    Consulted and Agree with Plan of Care  Patient       Patient will benefit from skilled therapeutic intervention in order to improve the following deficits and impairments:  Decreased knowledge of precautions, Impaired UE functional use, Decreased range of motion, Postural dysfunction, Pain, Decreased scar mobility, Increased fascial restricitons, Decreased strength, Increased edema  Visit Diagnosis: Stiffness of right shoulder, not elsewhere classified  Disorder of the skin and subcutaneous tissue related to radiation, unspecified  Postmastectomy lymphedema  Chronic right shoulder pain  Abnormal posture     Problem List Patient Active Problem List   Diagnosis Date Noted  . Genetic testing 10/01/2018  . Family history of breast cancer   . Family history of uterine cancer   . Family history of prostate cancer   . S/P mastectomy, right 08/20/2018  . Acquired absence of right breast 08/20/2018  . Port-A-Cath in place 06/21/2018  . MDD (major depressive disorder), recurrent episode, moderate ([place]) 06/13/2018  . Generalized anxiety disorder 06/13/2018  . OCD (obsessive compulsive disorder) 06/13/2018  . Insomnia 06/13/2018  . Malignant neoplasm of upper-outer quadrant of right breast in female, estrogen receptor negative ([place]) 03/02/2018  . Needs flu shot 06/03/2017  .  Chronic bilateral  low back pain without sciatica 06/03/2017  . Adjustment disorder with anxious mood 03/27/2016  . Benign paroxysmal positional vertigo 10/31/2014  . Arthritis 04/27/2014  . Fatigue 06/10/2013  . Morbid obesity ([place]) 06/10/2013  . Asthma, chronic obstructive, with acute exacerbation ([place]) 06/10/2013  . Allergy 06/10/2013    Allyson Sabal [facility] 01/11/2019, 11:23 AM  [place], [place], [address] Phone: [phone]   Fax:  [phone]  Name: Jacqueline Good MRN: [id number] Date of Birth: [birth year]  Manus Gunning, PT 01/11/19 11:23 AM

## 2019-01-11 NOTE — Progress Notes (Signed)
                                                                                                                                                             Patient Name: Jacqueline Good MRN: [id number] DOB: 08/02/1961 Referring Physician: Lurline Del (Profile Not Attached) Date of Service: 12/17/2018 [facility], [place]                                                        End Of Treatment Note  Diagnoses: C50.411-Malignant neoplasm of upper-outer quadrant of right female breast  Cancer Staging Malignant neoplasm of upper-outer quadrant of right breast in female, estrogen receptor negative ([place]) Staging form: Breast, AJCC 8th Edition - Clinical stage from 03/03/2018: Stage IIA (cT38m, cN1, cM0, G2, ER-, PR-, HER2+) - Unsigned  Intent: Curative  Radiation Treatment Dates: 11/15/2018 through 12/17/2018 Site Technique Total Dose Dose per Fx Completed Fx Beam Energies  Breast: CW_Rt 3D 50/50 2 25/25 10X, 6X  Breast: CW_Rt_SCV_PAB 3D 50/50 2 25/25 10X, 15X   Narrative: The patient tolerated radiation therapy relatively well. She experienced increased fatigue and some expected skin irritation with mild erythema to the right chest wall. She is applying Radiaplex gel to her skin as directed.   Plan: The patient will follow-up with radiation oncology in one month. I encouraged her to discuss the breast reconstruction plan with Dr. DMarla Roe I don't forsee reconstruction for months.  ________________________________________________  SEppie Gibson MD  This document serves as a record of services personally performed by SEppie Gibson MD. It was created on her behalf by HRae Lips a trained medical scribe. The creation of this record is based on the scribe's personal observations and the provider's statements to them. This document has been checked and approved by the attending provider.

## 2019-01-14 ENCOUNTER — Encounter: Payer: Self-pay | Admitting: General Practice

## 2019-01-14 NOTE — Progress Notes (Signed)
[place] CSW Progress Notes  Call to patient to check in, brief conversation to ensure she has resources she needs.  Has appt w psychiatrist via video visit next week.  Continues to struggle with isolation and frustration about length of time needed for healing after surgery.  As would be natural, is conflicted about going out vs staying inside due to fear of contracting COVID.  Feels isolated at home, but fears exposure if she goes out.  CSW will check in w her at next infusion in June.  Edwyna Shell, LCSW Clinical Social Worker Phone:  [phone]

## 2019-01-14 NOTE — Progress Notes (Signed)
Virtual Visit via Video Note  I connected with Jacqueline Good on 01/20/19 at 11:30 AM EDT by a video enabled telemedicine application and verified that I am speaking with the correct person using two identifiers.   I discussed the limitations of evaluation and management by telemedicine and the availability of in person appointments. The patient expressed understanding and agreed to proceed.  I discussed the assessment and treatment plan with the patient. The patient was provided an opportunity to ask questions and all were answered. The patient agreed with the plan and demonstrated an understanding of the instructions.   The patient was advised to call back or seek an in-person evaluation if the symptoms worsen or if the condition fails to improve as anticipated.  I provided 25 minutes of non-face-to-face time during this encounter.   Norman Clay, MD    [facility] MD/PA/NP OP Progress Note  01/20/2019 12:05 PM Jacqueline Good  MRN:  [id number]  Chief Complaint:  Chief Complaint    Depression; Follow-up     HPI:  Notified by the pharmacy that she filled xanax from her previous provider and this note writer on the same day.   This is a follow-up appointment for depression.  She states that she has been having more increase in appetite and feels nauseated after up titration of Abilify.  She does not notice any mood change.  She states that her day is "boring." She may do laundry or watch TV. When she is asked if she meets her mother, she answers "of course I do." She is not interested in going outside. She feels frustrated as "I don't have life." Although she states that "of course I would like to change," she does not know how. She has been going physical therapy twice a week, although "that's not joy." Although she enjoys meeting with her sister, who visits the patient twice a week, she does not want to talk with her sister more frequently, stating that she does not even like this visit. She  has no interest in seeing a therapy.  Although she used to enjoy her work as she was able to go outside and interact with other people. She continues that she used to enjoy it until she went to her previous work. She declined to talk further.  She has insomnia.  She feels fatigue.  She feels irritable.  She has fair concentration.  She denies SI.  She feels anxious and tense.  She has occasional panic attacks.  She takes Xanax every day.   She states that her daughter picked up Xanax and the patient picked up on her own. She understands that medication will not be continued if any concern of misuse.     Ambien 10 mg, on 6/9 xanax filled 105 tabs x 2 on 5/11   Visit Diagnosis:    ICD-10-CM   1. MDD (major depressive disorder), recurrent episode, moderate ([place])  F33.1     Past Psychiatric History: Please see initial evaluation for full details. I have reviewed the history. No updates at this time.     Past Medical History:  Past Medical History:  Diagnosis Date  . Anemia   . Anxiety   . Arthritis    "all over" (08/12/2018)  . Asthma   . Breast cancer, right breast ([place]) dx'd 02/2018  . Chronic bronchitis ([place])   . Depression   . Family history of breast cancer   . Family history of prostate cancer   . Family history  of uterine cancer   . GERD (gastroesophageal reflux disease)   . History of blood transfusion    "several; related to low blood" (08/12/2018)  . History of radiation therapy 11/15/18- 12/17/18   Right Chest wall, SCV, PAB. 25 fractions.     Past Surgical History:  Procedure Laterality Date  . AXILLARY LYMPH NODE DISSECTION Right 08/12/2018   Procedure: AXILLARY LYMPH NODE DISSECTION;  Surgeon: Alphonsa Overall, MD;  Location: [place];  Service: General;  Laterality: Right;  . BREAST BIOPSY Right 03/2018  . BREAST RECONSTRUCTION WITH PLACEMENT OF TISSUE EXPANDER AND FLEX HD (ACELLULAR HYDRATED DERMIS) Right 08/12/2018   Procedure: RIGHT BREAST RECONSTRUCTION WITH PLACEMENT OF  TISSUE EXPANDER AND FLEX HD (ACELLULAR HYDRATED DERMIS);  Surgeon: Wallace Going, DO;  Location: [place];  Service: Plastics;  Laterality: Right;  . DILATION AND CURETTAGE OF UTERUS    . ENDOMETRIAL ABLATION    . IR IMAGING GUIDED PORT INSERTION  03/18/2018  . MASTECTOMY MODIFIED RADICAL Right 08/12/2018   w/axillary LND  . MASTECTOMY MODIFIED RADICAL Right 08/12/2018   Procedure: RIGHT MODIFIED RADICAL MASTECTOMY;  Surgeon: Alphonsa Overall, MD;  Location: [place];  Service: General;  Laterality: Right;  . MYOMECTOMY    . REMOVAL OF TISSUE EXPANDER AND PLACEMENT OF IMPLANT Right 10/26/2018   Procedure: Removal of right breast expander;  Surgeon: Wallace Going, DO;  Location: [place];  Service: Plastics;  Laterality: Right;  75 min, please    Family Psychiatric History: Please see initial evaluation for full details. I have reviewed the history. No updates at this time.     Family History:  Family History  Problem Relation Age of Onset  . Lupus Sister   . Heart disease Sister   . Breast cancer Mother 9  . Prostate cancer Paternal Uncle   . Diabetes Maternal Grandmother   . Heart Problems Maternal Grandmother   . Prostate cancer Maternal Grandfather   . Prostate cancer Paternal Grandfather   . Prostate cancer Other        MGFs brother  . Breast cancer Other        Mat great-grandmother's sister  . Uterine cancer Maternal Great-grandmother   . Prostate cancer Other        PGFs brother    Social History:  Social History   Socioeconomic History  . Marital status: Single    Spouse name: Not on file  . Number of children: Not on file  . Years of education: Not on file  . Highest education level: Not on file  Occupational History  . Not on file  Social Needs  . Financial resource strain: Not on file  . Food insecurity    Worry: Not on file    Inability: Not on file  . Transportation needs    Medical: No    Non-medical: No  Tobacco Use  . Smoking status: Former  Smoker    Packs/day: 1.00    Years: 38.00    Pack years: 38.00    Types: Cigarettes    Quit date: 05/15/2018    Years since quitting: 0.6  . Smokeless tobacco: Never Used  Substance and Sexual Activity  . Alcohol use: Not Currently    Comment: 08/12/2018 "nothing since 04/2018"  . Drug use: No  . Sexual activity: Not Currently    Birth control/protection: None  Lifestyle  . Physical activity    Days per week: Not on file    Minutes per session: Not on file  .  Stress: Not on file  Relationships  . Social Herbalist on phone: Not on file    Gets together: Not on file    Attends religious service: Not on file    Active member of club or organization: Not on file    Attends meetings of clubs or organizations: Not on file    Relationship status: Not on file  Other Topics Concern  . Not on file  Social History Narrative  . Not on file    Allergies:  Allergies  Allergen Reactions  . Gadolinium Derivatives Itching and Cough    Pt began sneezing and coughing as soon as MRI contrast was injected. Severe nasal congestion. No rash or hives no SOB.   Marland Kitchen Hydrocodone Hives and Itching    Pt says she can take it with benadryl.   . Iodinated Diagnostic Agents Itching    Metabolic Disorder Labs: Lab Results  Component Value Date   HGBA1C 5.4 04/27/2014   No results found for: PROLACTIN Lab Results  Component Value Date   CHOL 159 09/24/2016   TRIG 179 (H) 09/24/2016   HDL 47 (L) 09/24/2016   CHOLHDL 3.4 09/24/2016   VLDL 36 (H) 09/24/2016   LDLCALC 76 09/24/2016   LDLCALC 86 04/27/2014   Lab Results  Component Value Date   TSH 0.58 09/24/2016   TSH 0.696 04/27/2014    Therapeutic Level Labs: No results found for: LITHIUM No results found for: VALPROATE No components found for:  CBMZ  Current Medications: Current Outpatient Medications  Medication Sig Dispense Refill  . albuterol (VENTOLIN HFA) 108 (90 Base) MCG/ACT inhaler INHALE 2 PUFFS INTO THE LUNGS  EVERY 6 HOURS AS NEEDED FOR WHEEZING 18 g 2  . ALPRAZolam (XANAX) 1 MG tablet 1 po q 6 hr prn.  No more than 3.5 qd. 105 tablet 0  . ARIPiprazole (ABILIFY) 2 MG tablet Take 1 tablet (2 mg total) by mouth daily. 90 tablet 0  . benzonatate (TESSALON PERLES) 100 MG capsule Take 1 capsule (100 mg total) by mouth 3 (three) times daily as needed for cough. 20 capsule 0  . diphenoxylate-atropine (LOMOTIL) 2.5-0.025 MG tablet Take 1 tablet by mouth 4 (four) times daily as needed for diarrhea or loose stools. 30 tablet 0  . fluticasone (FLONASE) 50 MCG/ACT nasal spray Place 2 sprays into both nostrils daily as needed for up to 14 days for allergies. 1 g 1  . Fluticasone-Salmeterol (ADVAIR DISKUS) 250-50 MCG/DOSE AEPB INHALE 1 PUFF INTO THE LUNGS 2 TIMES DAILY. 60 each 2  . lidocaine-prilocaine (EMLA) cream Apply to affected area once (Patient taking differently: Apply 1 application topically daily as needed (prior to port being flushed.). ) 30 g 3  . loperamide (IMODIUM) 2 MG capsule Take 2 mg by mouth as needed for diarrhea or loose stools.    Marland Kitchen loratadine (CLARITIN) 10 MG tablet Take 1 tablet (10 mg total) by mouth daily as needed for allergies. 30 tablet 11  . naproxen (NAPROSYN) 500 MG tablet As needed.    Marland Kitchen omeprazole (PRILOSEC) 40 MG capsule TAKE 1 CAPSULE BY MOUTH DAILY. 30 capsule 2  . propranolol (INDERAL) 40 MG tablet Take 40 mg by mouth daily as needed (heart palpitation.).     Derrill Memo ON 02/18/2019] venlafaxine (EFFEXOR) 75 MG tablet Take 3 tablets (225 mg total) by mouth daily. 270 tablet 0  . zolpidem (AMBIEN) 10 MG tablet Take 1 tablet (10 mg total) by mouth at bedtime as needed for  sleep (1/2-1 tab). 30 tablet 0   No current facility-administered medications for this visit.      Musculoskeletal: Strength & Muscle Tone: N/A Gait & Station: N/A Patient leans: N/A  Psychiatric Specialty Exam: Review of Systems  Psychiatric/Behavioral: Positive for depression. Negative for  hallucinations, memory loss, substance abuse and suicidal ideas. The patient is nervous/anxious and has insomnia.   All other systems reviewed and are negative.   There were no vitals taken for this visit.There is no height or weight on file to calculate BMI.  General Appearance: Fairly Groomed  Eye Contact:  Good  Speech:  Clear and Coherent  Volume:  Normal  Mood:  "same"  Affect:  Appropriate, Congruent and Restricted  Thought Process:  Coherent  Orientation:  Full (Time, Place, and Person)  Thought Content: Logical   Suicidal Thoughts:  No  Homicidal Thoughts:  No  Memory:  Immediate;   Good  Judgement:  Good  Insight:  Fair  Psychomotor Activity:  Normal  Concentration:  Concentration: Good and Attention Span: Good  Recall:  Good  Fund of Knowledge: Good  Language: Good  Akathisia:  No  Handed:  Right  AIMS (if indicated): not done  Assets:  Communication Skills Desire for Improvement  ADL's:  Intact  Cognition: WNL  Sleep:  Poor   Screenings: GAD-7     Office Visit from 04/22/2018 in [place] Office Visit from 06/03/2017 in [place] Office Visit from 09/24/2016 in [place] Office Visit from 03/27/2016 in [place]  Total GAD-7 Score  11  3  15  16     PHQ2-9     Office Visit from 04/22/2018 in [place] Office Visit from 06/03/2017 in [place] Office Visit from 09/24/2016 in [place] Office Visit from 05/01/2016 in [place] Office Visit from 03/27/2016 in [place]  PHQ-2 Total Score  5  0  2  5  2   PHQ-9 Total Score  15  4  7  8  10        Assessment and Plan:  Jacqueline Good is a 58 y.o. year old female with a history of depression, OCD,,estrogen negativeT2 pN1, stage IIB invasive  ductal carcinoma, diagnosed07/18/2019s/p mastectomy1/2020,neoadjuvantchemo immunotherapy/on trastzumab,asthma, OA, chronic back pain , who presents for follow up appointment for depression.   # MDD, moderate, recurrent without psychotic features # r/o PTSD She continues to report depressive symptoms and anxiety despite up titration of Abilify.  Psychosocial stressors includes medical condition of breast cancer and employment.  She also reports some traumatic experience at her previous work, although she declined to elaborate it.  Will taper down Abilify as adjunctive treatment for depression given she had some adverse reaction from higher dose.  Will continue venlafaxine to target depression.  Although she will benefit from adjunctive treatment, she does not want to try anything which could cause weight gain.  She also declined to see a therapist.  It is considered that it is important to build more rapport before making any further medication change.  Will continue Xanax as needed for anxiety.  Discussed risk of dependence and oversedation.  Will continue Ambien as needed for insomnia.   Plan I have reviewed and updated plans as below 1. Continue venlafaxine 225 mg daily 2. Decrease Abilify 2  mg (limited benefit from higher dose) 3. Continue Xanax 1 mg three times a day (and additional 0.5 mg daily) as needed for anxiety  4. Continue Ambien 10 mg at night as needed for insomnia 5. Next appointment: 7/8 at 11 AM for 30 mins, video  Past trials of medication: sertraline, fluoxetine, lexapro, buspar, venlafaxine, duloxetine, bupropion, quetiapine  The patient demonstrates the following risk factors for suicide: Chronic risk factors for suicide include:psychiatric disorder ofdepressionand medical illness of breast cancer. Acute risk factorsfor suicide include: unemployment and loss (financial, interpersonal, professional). Protective factorsfor this patient include: positive social  support, responsibility to others (children, family), coping skills and hope for the future. Considering these factors, the overall suicide risk at this point appears to below. Patientisappropriate for outpatient follow up.  The duration of this appointment visit was 25 minutes of non face-to-face time with the patient.  Greater than 50% of this time was spent in counseling, explanation of  diagnosis, planning of further management, and coordination of care.  Norman Clay, MD 01/20/2019, 12:05 PM

## 2019-01-17 ENCOUNTER — Telehealth: Payer: Self-pay | Admitting: Plastic Surgery

## 2019-01-17 NOTE — Telephone Encounter (Signed)

## 2019-01-18 ENCOUNTER — Encounter: Payer: Self-pay | Admitting: Radiation Oncology

## 2019-01-18 ENCOUNTER — Ambulatory Visit (INDEPENDENT_AMBULATORY_CARE_PROVIDER_SITE_OTHER): Payer: Medicaid Other | Admitting: Plastic Surgery

## 2019-01-18 ENCOUNTER — Other Ambulatory Visit: Payer: Self-pay

## 2019-01-18 ENCOUNTER — Telehealth (HOSPITAL_COMMUNITY): Payer: Self-pay | Admitting: *Deleted

## 2019-01-18 ENCOUNTER — Encounter: Payer: Self-pay | Admitting: Plastic Surgery

## 2019-01-18 VITALS — BP 123/82 | HR 84 | Temp 98.8°F | Ht 65.0 in | Wt 207.8 lb

## 2019-01-18 DIAGNOSIS — Z9011 Acquired absence of right breast and nipple: Secondary | ICD-10-CM | POA: Diagnosis not present

## 2019-01-18 MED FILL — ZOLPIDEM TARTRATE 10 MG TAB: 10 | 30 days supply | Qty: 30 | Fill #0

## 2019-01-18 NOTE — Telephone Encounter (Signed)
Noted, thanks. Could you figure out when did the patient receive refill from [facility] and the dose/tabs of medication? Also contact that pharmacy to cancel any remaining refill  from her as she transferred care from Ms. Rober Minion to me.

## 2019-01-18 NOTE — Progress Notes (Signed)
   Subjective:    Patient ID: Jacqueline Good, female    DOB: 1961-05-09, 58 y.o.   MRN: [id number]  The patient is a 58 year old female here for follow-up on her right breast surgery.  She underwent a mastectomy with expander placement.  She had some complications with the expander.  The decision was made to remove the expander and let her go ahead with her radiation.  She has now finished radiation.  She has the radiation effects of her skin still clearly present.  But she states that it is getting better weekly.  She finished radiation last month.  She is still getting chemo treatments every 3 weeks.  She is doing much better emotionally and psychologically with her diagnosis and treatment.  Today she was in very good spirits and even laughing.  It was really nice to see her doing better.  She is still interested in reconstruction.  She would like to have symmetry surgery for the left side to match as well.     Review of Systems  Constitutional: Negative for activity change and appetite change.  HENT: Negative.   Eyes: Negative.   Respiratory: Negative.  Negative for chest tightness and shortness of breath.   Cardiovascular: Negative for leg swelling.  Gastrointestinal: Negative for abdominal pain and constipation.  Endocrine: Negative.   Genitourinary: Negative.   Musculoskeletal: Negative for back pain.  Psychiatric/Behavioral: Negative.        Objective:   Physical Exam Vitals signs and nursing note reviewed.  Constitutional:      Appearance: Normal appearance.  HENT:     Head: Normocephalic and atraumatic.     Mouth/Throat:     Mouth: Mucous membranes are moist.  Eyes:     Extraocular Movements: Extraocular movements intact.     Pupils: Pupils are equal, round, and reactive to light.  Cardiovascular:     Rate and Rhythm: Normal rate.  Pulmonary:     Effort: Pulmonary effort is normal.  Abdominal:     General: Abdomen is flat. There is no distension.     Tenderness:  There is no abdominal tenderness.  Skin:    General: Skin is warm.     Capillary Refill: Capillary refill takes less than 2 seconds.  Neurological:     General: No focal deficit present.     Mental Status: She is alert and oriented to person, place, and time.  Psychiatric:        Mood and Affect: Mood normal.        Behavior: Behavior normal.        Thought Content: Thought content normal.        Assessment & Plan:  Acquired absence of right breast  S/P mastectomy, right I would like to see her back in 3 months.  We could plan on doing reconstruction 9 to 12 months after her last radiation treatment which would switch her at least to January 2021.  She is interested in the TRAM for the right side.  And a reduction mastopexy of the left Pictures were obtained of the patient and placed in the chart with the patient's or guardian's permission.

## 2019-01-18 NOTE — Telephone Encounter (Signed)
Dr Morey Hummingbird Pharmacist @ [facility] # [phone] called to inform that the patient has been getting Ambien & Xanax from you  & theresa Hurst. Using 2 different insurances. Files medicaid @ [facility]  & uses a Good Rx coupon card @ the [employer] in [place] in [place]  # [phone]. And that she picks up both scripts on the same day. And it hasn't showed up in the Opioid system because she's using 2 different insurance methods.

## 2019-01-18 NOTE — Telephone Encounter (Signed)
[facility] HAS 1 REFILL LEFT ON THE AMBIEN UNDER Nada Libman N.P. WAITING ON  CALL BACK FROM THE N.P. TO INFORM OF SITUATION. WILL GIVE LAST AMBIEN REFILL & REFER TO [facility] FOR FURTHER REFILLS. 5/11 XANAX REFILL UNDER YOU @ [facility] &  5/11 BOTH AMBIEN  & XANAX REFILLS UNDER THERESA HURST N.P. @ [place]

## 2019-01-19 NOTE — Progress Notes (Signed)
I called the patient today about her upcoming follow-up appointment in radiation oncology.   Given concerns about the COVID-19 pandemic, I offered a phone assessment with the patient to determine if coming to the clinic was necessary. She accepted.  I let the patient know that I had spoken with Dr. Isidore Moos, and she wanted them to know the importance of washing their hands for at least 20 seconds at a time, especially after going out in public, and before they eat.  Limit going out in public whenever possible. Do not touch your face, unless your hands are clean, such as when bathing. Get plenty of rest, eat well, and stay hydrated.   The patient denies any symptomatic concerns.  Specifically, they report good healing of their skin in the radiation fields.  Skin is intact. She does report continued hyperpigmentation, but has continued to use radiaplex and will switch to a vitamin E lotion or oil when the radiaplex is completed.  I recommended that she continue skin care by applying oil or lotion with vitamin E to the skin in the radiation fields, BID, for 2 more months.  Continue follow-up with medical oncology - follow-up is scheduled on 01/28/19 with Wilber Bihari NP and Herceptin infusion.  I explained that yearly mammograms are important for patients with intact breast tissue, and physical exams are important after mastectomy for patients that cannot undergo mammography.  I encouraged her to call if she had further questions or concerns about her healing. Otherwise, she will follow-up PRN in radiation oncology. Patient is pleased with this plan, and we will cancel her upcoming follow-up to reduce the risk of COVID-19 transmission.

## 2019-01-20 ENCOUNTER — Ambulatory Visit: Payer: Medicaid Other | Admitting: Physical Therapy

## 2019-01-20 ENCOUNTER — Other Ambulatory Visit: Payer: Self-pay

## 2019-01-20 ENCOUNTER — Encounter: Payer: Self-pay | Admitting: Physical Therapy

## 2019-01-20 ENCOUNTER — Encounter (HOSPITAL_COMMUNITY): Payer: Self-pay | Admitting: Psychiatry

## 2019-01-20 ENCOUNTER — Ambulatory Visit (INDEPENDENT_AMBULATORY_CARE_PROVIDER_SITE_OTHER): Payer: Medicaid Other | Admitting: Psychiatry

## 2019-01-20 DIAGNOSIS — F331 Major depressive disorder, recurrent, moderate: Secondary | ICD-10-CM

## 2019-01-20 DIAGNOSIS — M25511 Pain in right shoulder: Secondary | ICD-10-CM

## 2019-01-20 DIAGNOSIS — M25611 Stiffness of right shoulder, not elsewhere classified: Secondary | ICD-10-CM | POA: Diagnosis not present

## 2019-01-20 DIAGNOSIS — G8929 Other chronic pain: Secondary | ICD-10-CM

## 2019-01-20 DIAGNOSIS — L599 Disorder of the skin and subcutaneous tissue related to radiation, unspecified: Secondary | ICD-10-CM

## 2019-01-20 MED ORDER — ARIPIPRAZOLE 2 MG PO TABS: 2.0000 mg | ORAL_TABLET | Freq: Every day | ORAL | 0 refills | Status: DC

## 2019-01-20 MED ORDER — VENLAFAXINE HCL 75 MG PO TABS: 225.0000 mg | ORAL_TABLET | Freq: Every day | ORAL | 0 refills | Status: DC

## 2019-01-20 MED ORDER — ALPRAZOLAM 1 MG PO TABS: ORAL_TABLET | ORAL | 0 refills | Status: DC

## 2019-01-20 MED FILL — VENLAFAXINE HCL 75 MG TAB: 75 | 90 days supply | Qty: 270 | Fill #0

## 2019-01-20 MED FILL — ARIPiprazole 2 MG TABS: 2 | 90 days supply | Qty: 90 | Fill #0

## 2019-01-20 NOTE — Patient Instructions (Signed)
Shoulder: Flexion (Supine)    With hands shoulder width apart, slowly lower dowel to floor behind head. Do not let elbows bend. Keep back flat. Hold _5-15___ seconds. Repeat _5-10___ times. Do __2__ sessions per day. CAUTION: Stretch slowly and gently.  Copyright  VHI. All rights reserved.  Shoulder: Abduction (Supine)    With right arm flat on floor, hold dowel in palm. Slowly move arm up to side of head by pushing with opposite arm. Do not let elbow bend. Hold _5-15___ seconds. Repeat _5-10___ times. Do _2___ sessions per day. CAUTION: Stretch slowly and gently.  Copyright  VHI. All rights reserved.

## 2019-01-20 NOTE — Patient Instructions (Signed)
1. Continue venlafaxine 225 mg daily 2. Decrease Abilify 2 mg  3. Continue Xanax 1 mg three times a day (and additional 0.5 mg daily) as needed for anxiety  4. Continue Ambien 10 mg at night as needed for insomnia 5. Next appointment: 7/8 at 11 AM

## 2019-01-20 NOTE — Therapy (Signed)
[place], [place], [address] Phone: [phone]   Fax:  [phone]  Physical Therapy Treatment  Patient Details  Name: Jacqueline Good MRN: [id number] Date of Birth: 23-Mar-1961 Referring Provider (PT): [name]   Encounter Date: 01/20/2019  PT End of Session - 01/20/19 1354    Visit Number  2    Number of Visits  4    Date for PT Re-Evaluation  02/08/19    Authorization Type  Medicaid- sending auth for 3 visits in 30 day period good to 6/30    PT Start Time  1304    PT Stop Time  1345    PT Time Calculation (min)  41 min    Activity Tolerance  Patient tolerated treatment well    Behavior During Therapy  [facility] for tasks assessed/performed       Past Medical History:  Diagnosis Date  . Anemia   . Anxiety   . Arthritis    "all over" (08/12/2018)  . Asthma   . Breast cancer, right breast ([place]) dx'd 02/2018  . Chronic bronchitis ([place])   . Depression   . Family history of breast cancer   . Family history of prostate cancer   . Family history of uterine cancer   . GERD (gastroesophageal reflux disease)   . History of blood transfusion    "several; related to low blood" (08/12/2018)  . History of radiation therapy 11/15/18- 12/17/18   Right Chest wall, SCV, PAB. 25 fractions.     Past Surgical History:  Procedure Laterality Date  . AXILLARY LYMPH NODE DISSECTION Right 08/12/2018   Procedure: AXILLARY LYMPH NODE DISSECTION;  Surgeon: Alphonsa Overall, MD;  Location: [place];  Service: General;  Laterality: Right;  . BREAST BIOPSY Right 03/2018  . BREAST RECONSTRUCTION WITH PLACEMENT OF TISSUE EXPANDER AND FLEX HD (ACELLULAR HYDRATED DERMIS) Right 08/12/2018   Procedure: RIGHT BREAST RECONSTRUCTION WITH PLACEMENT OF TISSUE EXPANDER AND FLEX HD (ACELLULAR HYDRATED DERMIS);  Surgeon: Wallace Going, DO;  Location: [place];  Service: Plastics;  Laterality: Right;  . DILATION AND CURETTAGE OF UTERUS    .  ENDOMETRIAL ABLATION    . IR IMAGING GUIDED PORT INSERTION  03/18/2018  . MASTECTOMY MODIFIED RADICAL Right 08/12/2018   w/axillary LND  . MASTECTOMY MODIFIED RADICAL Right 08/12/2018   Procedure: RIGHT MODIFIED RADICAL MASTECTOMY;  Surgeon: Alphonsa Overall, MD;  Location: [place];  Service: General;  Laterality: Right;  . MYOMECTOMY    . REMOVAL OF TISSUE EXPANDER AND PLACEMENT OF IMPLANT Right 10/26/2018   Procedure: Removal of right breast expander;  Surgeon: Wallace Going, DO;  Location: [place];  Service: Plastics;  Laterality: Right;  75 min, please    There were no vitals filed for this visit.  Subjective Assessment - 01/20/19 1305    Subjective  I still don't have full range in my shoulder. I am aware of the movement that I do with it.    Pertinent History  Patient was diagnosed on 02/04/18 with right invasive ductal carcinoma breast cancer. It measures 3.1 cm and is located in the upper outer quadrant. It is ER/PR negative and HER2 positive with a Ki67 of 50%. She has a known positive axillary lymph node. She has no other medical problems but does smoke 1 pack per day., 08/12/18- right mastectomy with ALND, 10/26/18- pt had expander removed because of infection, she has completed chemo and radiation    Patient Stated Goals  to get  rid of these spasms    Currently in Pain?  No/denies    Pain Score  0-No pain                       [facility] Adult PT Treatment/Exercise - 01/20/19 0001      Shoulder Exercises: Supine   Flexion  AAROM;Both;5 reps   with 5 sec holds, pt returned pt demo   ABduction  AAROM;Right;5 reps   5 sec holds, pt returned therapist demo     Manual Therapy   Manual Therapy  Soft tissue mobilization;Passive ROM    Soft tissue mobilization  to R pectoralis muscle especially at axilla and insertion on sternum where pt has most tightness and pain    Passive ROM  to R shoulder as tolerated in direction of flexion, abduction and ER which was limited by pec  soreness                  PT Long Term Goals - 01/11/19 1120      PT LONG TERM GOAL #1   Title  Pt will report a 75% improvement in right chest pain to decrease discomfort caused by spasms.    Time  4    Period  Weeks    Status  New    Target Date  02/08/19      PT LONG TERM GOAL #2   Title  Pt will be independent in a home exercise program for continued strengthening and stretching    Time  4    Period  Weeks    Status  New    Target Date  02/08/19      PT LONG TERM GOAL #3   Title  Pt will report a 25% improvement in swelling in right chest to allow improved comfort.    Time  4    Period  Weeks    Status  New    Target Date  02/08/19      PT LONG TERM GOAL #4   Title  Pt will demonstrate 150 degrees of right shoulder flexion to allow her to reach overhead.    Baseline  116    Time  4    Period  Weeks    Status  New    Target Date  02/08/19      PT LONG TERM GOAL #5   Title  Pt will demonstrate 140 degrees of right shoulder abduction to allow her to reach out to the side.    Baseline  89    Time  4    Period  Weeks    Status  New    Target Date  02/08/19            Plan - 01/20/19 1356    Clinical Impression Statement  Began STM to R pec muscle in area of tightness. She has numerous tender areas in her R pec muscle that she could barely tolerate touching at beginning of session but desensitized by the end of the session. Began PROM to R shoulder. Pt limited due to pec soreness.    Rehab Potential  Good    Clinical Impairments Affecting Rehab Potential  hx of radiation, hx of infections in right chest    PT Frequency  Other (comment)    PT Duration  4 weeks    PT Treatment/Interventions  ADLs/Self Care Home Management;Therapeutic exercise;Patient/family education;Orthotic Fit/Training;Manual techniques;Manual lymph drainage;Scar mobilization;Passive range of motion;Taping;Joint Manipulations    PT Next  Visit Plan  ask pt if she had less muscle  spasms, continue gentle PROM to R shoulder, gentle STM to R chest, scar massage R chest,assess indep with supine dowel    PT Home Exercise Plan  supine dowel ex    Consulted and Agree with Plan of Care  Patient       Patient will benefit from skilled therapeutic intervention in order to improve the following deficits and impairments:  Decreased knowledge of precautions, Impaired UE functional use, Decreased range of motion, Postural dysfunction, Pain, Decreased scar mobility, Increased fascial restricitons, Decreased strength, Increased edema  Visit Diagnosis: Stiffness of right shoulder, not elsewhere classified  Disorder of the skin and subcutaneous tissue related to radiation, unspecified  Chronic right shoulder pain     Problem List Patient Active Problem List   Diagnosis Date Noted  . Genetic testing 10/01/2018  . Family history of breast cancer   . Family history of uterine cancer   . Family history of prostate cancer   . S/P mastectomy, right 08/20/2018  . Acquired absence of right breast 08/20/2018  . Port-A-Cath in place 06/21/2018  . MDD (major depressive disorder), recurrent episode, moderate ([place]) 06/13/2018  . Generalized anxiety disorder 06/13/2018  . OCD (obsessive compulsive disorder) 06/13/2018  . Insomnia 06/13/2018  . Malignant neoplasm of upper-outer quadrant of right breast in female, estrogen receptor negative ([place]) 03/02/2018  . Needs flu shot 06/03/2017  . Chronic bilateral low back pain without sciatica 06/03/2017  . Adjustment disorder with anxious mood 03/27/2016  . Benign paroxysmal positional vertigo 10/31/2014  . Arthritis 04/27/2014  . Fatigue 06/10/2013  . Morbid obesity ([place]) 06/10/2013  . Asthma, chronic obstructive, with acute exacerbation ([place]) 06/10/2013  . Allergy 06/10/2013    Allyson Sabal [facility] 01/20/2019, 2:04 PM  [place], [place],  [address] Phone: [phone]   Fax:  [phone]  Name: PATTON SWISHER MRN: [id number] Date of Birth: [birth year]  Manus Gunning, PT 01/20/19 2:04 PM

## 2019-01-21 ENCOUNTER — Ambulatory Visit
Admission: RE | Admit: 2019-01-21 | Discharge: 2019-01-21 | Disposition: A | Discharge status: Home / Self Care | Payer: Medicaid Other | Source: Ambulatory Visit | Attending: Radiation Oncology | Admitting: Radiation Oncology

## 2019-01-21 HISTORY — DX: Personal history of irradiation: Z92.3

## 2019-01-24 ENCOUNTER — Other Ambulatory Visit: Payer: Self-pay | Admitting: Internal Medicine

## 2019-01-25 ENCOUNTER — Ambulatory Visit: Payer: Medicaid Other

## 2019-01-25 ENCOUNTER — Other Ambulatory Visit: Payer: Self-pay

## 2019-01-25 DIAGNOSIS — M25611 Stiffness of right shoulder, not elsewhere classified: Secondary | ICD-10-CM

## 2019-01-25 DIAGNOSIS — L599 Disorder of the skin and subcutaneous tissue related to radiation, unspecified: Secondary | ICD-10-CM

## 2019-01-25 DIAGNOSIS — G8929 Other chronic pain: Secondary | ICD-10-CM

## 2019-01-25 NOTE — Therapy (Signed)
[place], [place], [address] Phone: [phone]   Fax:  [phone]  Physical Therapy Treatment  Patient Details  Name: Jacqueline Good MRN: [id number] Date of Birth: 1961/05/27 Referring Provider (PT): [name]   Encounter Date: 01/25/2019  PT End of Session - 01/25/19 1057    Visit Number  3    Number of Visits  4    Date for PT Re-Evaluation  02/08/19    Authorization Type  Medicaid- sending auth for 3 visits in 30 day period good to 6/30    PT Start Time  1003    PT Stop Time  1050    PT Time Calculation (min)  47 min    Activity Tolerance  Patient tolerated treatment well    Behavior During Therapy  [facility] for tasks assessed/performed       Past Medical History:  Diagnosis Date  . Anemia   . Anxiety   . Arthritis    "all over" (08/12/2018)  . Asthma   . Breast cancer, right breast ([name]) dx'd 02/2018  . Chronic bronchitis ([place])   . Depression   . Family history of breast cancer   . Family history of prostate cancer   . Family history of uterine cancer   . GERD (gastroesophageal reflux disease)   . History of blood transfusion    "several; related to low blood" (08/12/2018)  . History of radiation therapy 11/15/18- 12/17/18   Right Chest wall, SCV, PAB. 25 fractions.     Past Surgical History:  Procedure Laterality Date  . AXILLARY LYMPH NODE DISSECTION Right 08/12/2018   Procedure: AXILLARY LYMPH NODE DISSECTION;  Surgeon: Alphonsa Overall, MD;  Location: [place];  Service: General;  Laterality: Right;  . BREAST BIOPSY Right 03/2018  . BREAST RECONSTRUCTION WITH PLACEMENT OF TISSUE EXPANDER AND FLEX HD (ACELLULAR HYDRATED DERMIS) Right 08/12/2018   Procedure: RIGHT BREAST RECONSTRUCTION WITH PLACEMENT OF TISSUE EXPANDER AND FLEX HD (ACELLULAR HYDRATED DERMIS);  Surgeon: Wallace Going, DO;  Location: [place];  Service: Plastics;  Laterality: Right;  . DILATION AND CURETTAGE OF UTERUS    .  ENDOMETRIAL ABLATION    . IR IMAGING GUIDED PORT INSERTION  03/18/2018  . MASTECTOMY MODIFIED RADICAL Right 08/12/2018   w/axillary LND  . MASTECTOMY MODIFIED RADICAL Right 08/12/2018   Procedure: RIGHT MODIFIED RADICAL MASTECTOMY;  Surgeon: Alphonsa Overall, MD;  Location: [place];  Service: General;  Laterality: Right;  . MYOMECTOMY    . REMOVAL OF TISSUE EXPANDER AND PLACEMENT OF IMPLANT Right 10/26/2018   Procedure: Removal of right breast expander;  Surgeon: Wallace Going, DO;  Location: [place];  Service: Plastics;  Laterality: Right;  75 min, please    There were no vitals filed for this visit.  Subjective Assessment - 01/25/19 1007    Subjective  I felt good after last session, my soreness seemed improved. I've been doing my exercises, some soreness after but I know it's bc I've done something.    Pertinent History  Patient was diagnosed on 02/04/18 with right invasive ductal carcinoma breast cancer. It measures 3.1 cm and is located in the upper outer quadrant. It is ER/PR negative and HER2 positive with a Ki67 of 50%. She has a known positive axillary lymph node. She has no other medical problems but does smoke 1 pack per day., 08/12/18- right mastectomy with ALND, 10/26/18- pt had expander removed because of infection, she has completed chemo and radiation  Patient Stated Goals  to get rid of these spasms    Currently in Pain?  No/denies   just feeling achy today from cold, rainy weather                      [facility] Adult PT Treatment/Exercise - 01/25/19 0001      Manual Therapy   Manual Therapy  Soft tissue mobilization;Passive ROM;Myofascial release    Soft tissue mobilization  to Rt pectoralis muscle especially at axilla and insertion on sternum where pt has most tightness and pain    Myofascial Release  Rt UE pulling throughout P/ROM; crosshands technique horizontally at chest superior to incision near sternum, then hear axilla and at axilla. Pt with minor tenderness  here at start of session but was quickly able to tolerate this well throughout rest of session    Passive ROM  to Rt shoulder as tolerated in direction of flexion, abduction, D2 and ER                  PT Long Term Goals - 01/11/19 1120      PT LONG TERM GOAL #1   Title  Pt will report a 75% improvement in right chest pain to decrease discomfort caused by spasms.    Time  4    Period  Weeks    Status  New    Target Date  02/08/19      PT LONG TERM GOAL #2   Title  Pt will be independent in a home exercise program for continued strengthening and stretching    Time  4    Period  Weeks    Status  New    Target Date  02/08/19      PT LONG TERM GOAL #3   Title  Pt will report a 25% improvement in swelling in right chest to allow improved comfort.    Time  4    Period  Weeks    Status  New    Target Date  02/08/19      PT LONG TERM GOAL #4   Title  Pt will demonstrate 150 degrees of right shoulder flexion to allow her to reach overhead.    Baseline  116    Time  4    Period  Weeks    Status  New    Target Date  02/08/19      PT LONG TERM GOAL #5   Title  Pt will demonstrate 140 degrees of right shoulder abduction to allow her to reach out to the side.    Baseline  89    Time  4    Period  Weeks    Status  New    Target Date  02/08/19            Plan - 01/25/19 1058    Clinical Impression Statement  Pt was able to tolerate stretching and light myofascial pressure better today, also reporting this felt better. Still with some sensitivity, tenderness and soreness at end ROM's but pt tolerated stretching well and reports feeling looser at end of session.    Personal Factors and Comorbidities  Comorbidity 1;Comorbidity 2;Other    Comorbidities  depression, anxiety, lives alone    Examination-Activity Limitations  Reach Overhead;Carry    Examination-Participation Restrictions  Cleaning    Stability/Clinical Decision Making  Evolving/Moderate complexity     Rehab Potential  Good    Clinical Impairments Affecting Rehab Potential  hx of  radiation, hx of infections in right chest    PT Frequency  Other (comment)    PT Duration  4 weeks    PT Treatment/Interventions  ADLs/Self Care Home Management;Therapeutic exercise;Patient/family education;Orthotic Fit/Training;Manual techniques;Manual lymph drainage;Scar mobilization;Passive range of motion;Taping;Joint Manipulations    PT Next Visit Plan  Pt will require Medicaid renewal after next session if to cont. Continue gentle PROM to Rt shoulder, gentle STM to Rt chest, scar massage Rt chest, assess indep with supine dowel    Consulted and Agree with Plan of Care  Patient       Patient will benefit from skilled therapeutic intervention in order to improve the following deficits and impairments:  Decreased knowledge of precautions, Impaired UE functional use, Decreased range of motion, Postural dysfunction, Pain, Decreased scar mobility, Increased fascial restricitons, Decreased strength, Increased edema  Visit Diagnosis: 1. Stiffness of right shoulder, not elsewhere classified   2. Disorder of the skin and subcutaneous tissue related to radiation, unspecified   3. Chronic right shoulder pain        Problem List Patient Active Problem List   Diagnosis Date Noted  . Genetic testing 10/01/2018  . Family history of breast cancer   . Family history of uterine cancer   . Family history of prostate cancer   . S/P mastectomy, right 08/20/2018  . Acquired absence of right breast 08/20/2018  . Port-A-Cath in place 06/21/2018  . MDD (major depressive disorder), recurrent episode, moderate ([place]) 06/13/2018  . Generalized anxiety disorder 06/13/2018  . OCD (obsessive compulsive disorder) 06/13/2018  . Insomnia 06/13/2018  . Malignant neoplasm of upper-outer quadrant of right breast in female, estrogen receptor negative ([place]) 03/02/2018  . Needs flu shot 06/03/2017  . Chronic bilateral low back pain  without sciatica 06/03/2017  . Adjustment disorder with anxious mood 03/27/2016  . Benign paroxysmal positional vertigo 10/31/2014  . Arthritis 04/27/2014  . Fatigue 06/10/2013  . Morbid obesity ([place]) 06/10/2013  . Asthma, chronic obstructive, with acute exacerbation ([place]) 06/10/2013  . Allergy 06/10/2013    Otelia Limes, PTA 01/25/2019, 11:04 AM  [place], [place], [address] Phone: [phone]   Fax:  [phone]  Name: Jacqueline Good MRN: [id number] Date of Birth: 31-May-1961

## 2019-01-26 MED FILL — ESOMEPRAZOLE MAG DR 40 MG C: 40 | 30 days supply | Qty: 30 | Fill #0

## 2019-01-28 ENCOUNTER — Inpatient Hospital Stay: Payer: Medicaid Other | Admitting: General Practice

## 2019-01-28 ENCOUNTER — Encounter: Payer: Self-pay | Admitting: General Practice

## 2019-01-28 ENCOUNTER — Encounter: Payer: Self-pay | Admitting: Adult Health

## 2019-01-28 ENCOUNTER — Inpatient Hospital Stay: Payer: Medicaid Other

## 2019-01-28 ENCOUNTER — Other Ambulatory Visit: Payer: Self-pay

## 2019-01-28 ENCOUNTER — Inpatient Hospital Stay: Payer: Medicaid Other | Attending: Oncology | Admitting: Adult Health

## 2019-01-28 VITALS — BP 125/70 | HR 85 | Temp 98.5°F | Resp 17 | Ht 65.0 in | Wt 210.4 lb

## 2019-01-28 DIAGNOSIS — Z171 Estrogen receptor negative status [ER-]: Secondary | ICD-10-CM | POA: Insufficient documentation

## 2019-01-28 DIAGNOSIS — D3502 Benign neoplasm of left adrenal gland: Secondary | ICD-10-CM | POA: Diagnosis not present

## 2019-01-28 DIAGNOSIS — J45901 Unspecified asthma with (acute) exacerbation: Secondary | ICD-10-CM

## 2019-01-28 DIAGNOSIS — Z95828 Presence of other vascular implants and grafts: Secondary | ICD-10-CM

## 2019-01-28 DIAGNOSIS — Z5112 Encounter for antineoplastic immunotherapy: Secondary | ICD-10-CM | POA: Insufficient documentation

## 2019-01-28 DIAGNOSIS — Z87891 Personal history of nicotine dependence: Secondary | ICD-10-CM

## 2019-01-28 DIAGNOSIS — Z1239 Encounter for other screening for malignant neoplasm of breast: Secondary | ICD-10-CM | POA: Diagnosis not present

## 2019-01-28 DIAGNOSIS — C50411 Malignant neoplasm of upper-outer quadrant of right female breast: Secondary | ICD-10-CM

## 2019-01-28 DIAGNOSIS — Z79899 Other long term (current) drug therapy: Secondary | ICD-10-CM

## 2019-01-28 DIAGNOSIS — J441 Chronic obstructive pulmonary disease with (acute) exacerbation: Secondary | ICD-10-CM

## 2019-01-28 LAB — COMPREHENSIVE METABOLIC PANEL WITH GFR
ALT: 19 U/L (ref 0–44)
AST: 17 U/L (ref 15–41)
Albumin: 3.6 g/dL (ref 3.5–5.0)
Alkaline Phosphatase: 97 U/L (ref 38–126)
Anion gap: 11 (ref 5–15)
BUN: 28 mg/dL — ABNORMAL HIGH (ref 6–20)
CO2: 22 mmol/L (ref 22–32)
Calcium: 8.7 mg/dL — ABNORMAL LOW (ref 8.9–10.3)
Chloride: 111 mmol/L (ref 98–111)
Creatinine, Ser: 1.29 mg/dL — ABNORMAL HIGH (ref 0.44–1.00)
GFR calc Af Amer: 53 mL/min — ABNORMAL LOW (ref >60)
GFR calc non Af Amer: 46 mL/min — ABNORMAL LOW (ref >60)
Glucose, Bld: 108 mg/dL — ABNORMAL HIGH (ref 70–99)
Potassium: 3.9 mmol/L (ref 3.5–5.1)
Sodium: 144 mmol/L (ref 135–145)
Total Bilirubin: 0.3 mg/dL (ref 0.3–1.2)
Total Protein: 6.8 g/dL (ref 6.5–8.1)

## 2019-01-28 LAB — CBC WITH DIFFERENTIAL/PLATELET
Abs Immature Granulocytes: 0.03 K/uL (ref 0.00–0.07)
Basophils Absolute: 0 K/uL (ref 0.0–0.1)
Basophils Relative: 0 %
Eosinophils Absolute: 0.1 K/uL (ref 0.0–0.5)
Eosinophils Relative: 2 %
HCT: 32 % — ABNORMAL LOW (ref 36.0–46.0)
Hemoglobin: 10 g/dL — ABNORMAL LOW (ref 12.0–15.0)
Immature Granulocytes: 1 %
Lymphocytes Relative: 10 %
Lymphs Abs: 0.7 K/uL (ref 0.7–4.0)
MCH: 28.9 pg (ref 26.0–34.0)
MCHC: 31.3 g/dL (ref 30.0–36.0)
MCV: 92.5 fL (ref 80.0–100.0)
Monocytes Absolute: 0.4 K/uL (ref 0.1–1.0)
Monocytes Relative: 6 %
Neutro Abs: 5.4 K/uL (ref 1.7–7.7)
Neutrophils Relative %: 81 %
Platelets: 211 K/uL (ref 150–400)
RBC: 3.46 MIL/uL — ABNORMAL LOW (ref 3.87–5.11)
RDW: 15.1 % (ref 11.5–15.5)
WBC: 6.6 K/uL (ref 4.0–10.5)
nRBC: 0 % (ref 0.0–0.2)

## 2019-01-28 MED ORDER — HEPARIN SOD (PORK) LOCK FLUSH 100 UNIT/ML IV SOLN
500.0000 [IU] | Freq: Once | INTRAVENOUS | Status: AC | PRN
  Administered 2019-01-28: 500 [IU]
  Filled 2019-01-28: qty 5

## 2019-01-28 MED ORDER — ACETAMINOPHEN 325 MG PO TABS
ORAL_TABLET | ORAL | Status: AC
  Filled 2019-01-28: qty 2

## 2019-01-28 MED ORDER — SODIUM CHLORIDE 0.9% FLUSH
10.0000 mL | INTRAVENOUS | Status: DC | PRN
  Administered 2019-01-28: 10 mL
  Filled 2019-01-28: qty 10

## 2019-01-28 MED ORDER — DIPHENHYDRAMINE HCL 25 MG PO CAPS
25.0000 mg | ORAL_CAPSULE | Freq: Once | ORAL | Status: AC
  Administered 2019-01-28: 25 mg via ORAL

## 2019-01-28 MED ORDER — ACETAMINOPHEN 325 MG PO TABS
650.0000 mg | ORAL_TABLET | Freq: Once | ORAL | Status: AC
  Administered 2019-01-28: 650 mg via ORAL

## 2019-01-28 MED ORDER — DIPHENHYDRAMINE HCL 25 MG PO CAPS
ORAL_CAPSULE | ORAL | Status: AC
  Filled 2019-01-28: qty 1

## 2019-01-28 MED ORDER — SODIUM CHLORIDE 0.9 % IV SOLN
Freq: Once | INTRAVENOUS | Status: AC
  Administered 2019-01-28: 09:00:00 via INTRAVENOUS
  Filled 2019-01-28: qty 250

## 2019-01-28 MED ORDER — TRASTUZUMAB CHEMO 150 MG IV SOLR
600.0000 mg | Freq: Once | INTRAVENOUS | Status: AC
  Administered 2019-01-28: 600 mg via INTRAVENOUS
  Filled 2019-01-28: qty 28.57

## 2019-01-28 MED ORDER — SODIUM CHLORIDE 0.9% FLUSH
10.0000 mL | Freq: Once | INTRAVENOUS | Status: AC
  Administered 2019-01-28: 10 mL
  Filled 2019-01-28: qty 10
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
## 2019-01-28 NOTE — Progress Notes (Signed)
[facility]  Telephone:([phone] Fax:(336) [phone]    ID: Jacqueline Good DOB: 1960/10/14  MR#: [id number]  OXB#:[id number]  Patient Care Team: [name], MD as PCP - General (Internal Medicine) Alphonsa Overall, MD as Consulting Physician (General Surgery) [name], [name], MD as Consulting Physician (Oncology) Eppie Gibson, MD as Attending Physician (Radiation Oncology) Dillingham, Loel Lofty, DO as Attending Physician (Plastic Surgery) OTHER MD: Donnal Moat, PA-C [FAX [phone]]   CHIEF COMPLAINT: Estrogen receptor negative breast cancer  CURRENT TREATMENT: Trastuzumab, completing adjuvant radiation   INTERVAL HISTORY: Jacqueline Good returns today for follow-up and treatment of her estrogen receptor negative breast cancer.  Her last echo was 12/14/2018, which showed an ejection fraction in the 55-60% range. Her trastuzumab had been held since after her 10/13/2018 dose due to low ejection fraction and she was cleared to restart on 12/15/2018 .  She was recommended repeat echo in 02/2019 by Dr. Aundra Dubin at that time.  This is scheduled for 02/18/2019.  She has tolerated the resumption of the Trastuzumab without any issues.Marland Kitchen     REVIEW OF SYSTEMS: Jacqueline Good continues to have anxiety and depression.  She is tearful about her breast and is looking forward to surgery.  She is following up with our social worker, Edwyna Shell, and is seeing psychiatry.    Jacqueline Good has an occasional dry cough.  She denies any fever or chills.  She is without any pain.  She has no shortness of breath, chest pain, palpitations.  She denies any lymphadenopathy, vision changes, unusual headaches, nausea, vomiting, bowel/bladder changes.  A detailed ROS was non contributory.    Jacqueline Good notes that she is walking for exercise when her anxiety gets bad enough.     HISTORY OF CURRENT ILLNESS: From the original intake note:  Jacqueline Good had routine screening mammography on 02/05/2018 showing a  possible abnormality in the right breast. She underwent unilateral right diagnostic mammography with tomography and right breast ultrasonography at [facility] on 02/15/2018 showing: Highly suspicious right breast mass at 10 o'clock position.  There was a second, 0.6 cm lesion at the 10:00 radiant which has not been biopsied.  Suspicious focal cortical thickening of a single right axillary lymph node.  Accordingly on 02/25/2018 she proceeded to biopsy of the right breast area in question. The pathology from this procedure showed ([id number]): Breast, right, needle core biopsy, OUQ with microscopic focus of invasive ductal carcinoma, grade 2, arising in a background of high grade ductal carcinoma in situ. 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   The patient's subsequent history is as detailed below.    PAST MEDICAL HISTORY: Past Medical History:  Diagnosis Date   Anemia    Anxiety    Arthritis    "all over" (08/12/2018)   Asthma    Breast cancer, right breast ([place]) dx'd 02/2018   Chronic bronchitis ([place])    Depression    Family history of breast cancer    Family history of prostate cancer    Family history of uterine cancer    GERD (gastroesophageal reflux disease)    History of blood transfusion    "several; related to low blood" (08/12/2018)   History of radiation therapy 11/15/18- 12/17/18   Right Chest wall, SCV, PAB. 25 fractions.     PAST SURGICAL HISTORY: Past Surgical History:  Procedure Laterality Date   AXILLARY LYMPH NODE DISSECTION Right 08/12/2018   Procedure: AXILLARY LYMPH NODE  DISSECTION;  Surgeon: Alphonsa Overall, MD;  Location: [facility];  Service: General;  Laterality: Right;   BREAST BIOPSY Right 03/2018   BREAST RECONSTRUCTION WITH PLACEMENT OF TISSUE EXPANDER AND FLEX HD (ACELLULAR HYDRATED DERMIS) Right 08/12/2018   Procedure: RIGHT BREAST RECONSTRUCTION WITH PLACEMENT OF TISSUE EXPANDER AND  FLEX HD (ACELLULAR HYDRATED DERMIS);  Surgeon: Wallace Going, DO;  Location: [facility];  Service: Plastics;  Laterality: Right;   DILATION AND CURETTAGE OF UTERUS     ENDOMETRIAL ABLATION     IR IMAGING GUIDED PORT INSERTION  03/18/2018   MASTECTOMY MODIFIED RADICAL Right 08/12/2018   w/axillary LND   MASTECTOMY MODIFIED RADICAL Right 08/12/2018   Procedure: RIGHT MODIFIED RADICAL MASTECTOMY;  Surgeon: Alphonsa Overall, MD;  Location: [facility];  Service: General;  Laterality: Right;   MYOMECTOMY     REMOVAL OF TISSUE EXPANDER AND PLACEMENT OF IMPLANT Right 10/26/2018   Procedure: Removal of right breast expander;  Surgeon: Wallace Going, DO;  Location: [facility];  Service: Plastics;  Laterality: Right;  75 min, please    FAMILY HISTORY: Family History  Problem Relation Age of Onset   Lupus Sister    Heart disease Sister    Breast cancer Mother 93   Prostate cancer Paternal Uncle    Diabetes Maternal Grandmother    Heart Problems Maternal Grandmother    Prostate cancer Maternal Grandfather    Prostate cancer Paternal Grandfather    Prostate cancer Other        MGFs brother   Breast cancer Other        Mat great-grandmother's sister   Uterine cancer Maternal Great-grandmother    Prostate cancer Other        PGFs brother   She notes that her father is currently 72 years old. Patients' mother is currently 12 years old and she was diagnosed with breast cancer at age 64. The patient has 4 brothers and 1 sister. She has a maternal aunt with breast cancer and her maternal great grandmother with had ovarian cancer.    GYNECOLOGIC HISTORY:  No LMP recorded. Patient is postmenopausal. Menarche: 58 years old Age at first live birth: 58 years old GX P: 1 LMP: 13-14 years ago s/p ablation Contraceptive: no HRT: no  Hysterectomy: no SO: no   SOCIAL HISTORY: She is currently unemployed.  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 She lives alone without pets.  Her daughter is [name] who lives in [place] and works in Therapist, art.  The patient has one grandchild. She goes to a [employer].   ADVANCED DIRECTIVES: Not in place.  At the 03/03/2018 visit the patient was given the appropriate documents to complete and notarized at her discretion   HEALTH MAINTENANCE: Social History   Tobacco Use   Smoking status: Former Smoker    Packs/day: 1.00    Years: 38.00    Pack years: 38.00    Types: Cigarettes    Quit date: 05/15/2018    Years since quitting: 0.7   Smokeless tobacco: Never Used  Substance Use Topics   Alcohol use: Not Currently    Comment: 08/12/2018 "nothing since 04/2018"   Drug use: No     Colonoscopy: Never  PAP:   Bone density: Never   Allergies  Allergen Reactions   Gadolinium Derivatives Itching and Cough    Pt began sneezing and coughing as soon as MRI contrast was injected. Severe nasal congestion. No rash or hives no SOB.    Hydrocodone  Hives and Itching    Pt says she can take it with benadryl.    Iodinated Diagnostic Agents Itching    Current Outpatient Medications  Medication Sig Dispense Refill   albuterol (VENTOLIN HFA) 108 (90 Base) MCG/ACT inhaler INHALE 2 PUFFS INTO THE LUNGS EVERY 6 HOURS AS NEEDED FOR WHEEZING 18 g 2   ALPRAZolam (XANAX) 1 MG tablet 1 po q 6 hr prn.  No more than 3.5 qd. 105 tablet 0   ARIPiprazole (ABILIFY) 2 MG tablet Take 1 tablet (2 mg total) by mouth daily. 90 tablet 0   benzonatate (TESSALON PERLES) 100 MG capsule Take 1 capsule (100 mg total) by mouth 3 (three) times daily as needed for cough. 20 capsule 0   diphenoxylate-atropine (LOMOTIL) 2.5-0.025 MG tablet Take 1 tablet by mouth 4 (four) times daily as needed for diarrhea or loose stools. 30 tablet 0   esomeprazole (NEXIUM) 40 MG capsule TAKE ONE CAPSULE BY MOUTH DAILY 30 capsule 2   fluticasone (FLONASE) 50 MCG/ACT nasal spray Place 2 sprays into both nostrils daily as needed for up to 14  days for allergies. 1 g 1   Fluticasone-Salmeterol (ADVAIR DISKUS) 250-50 MCG/DOSE AEPB INHALE 1 PUFF INTO THE LUNGS 2 TIMES DAILY. 60 each 2   lidocaine-prilocaine (EMLA) cream Apply to affected area once (Patient taking differently: Apply 1 application topically daily as needed (prior to port being flushed.). ) 30 g 3   loperamide (IMODIUM) 2 MG capsule Take 2 mg by mouth as needed for diarrhea or loose stools.     loratadine (CLARITIN) 10 MG tablet Take 1 tablet (10 mg total) by mouth daily as needed for allergies. 30 tablet 11   naproxen (NAPROSYN) 500 MG tablet As needed.     omeprazole (PRILOSEC) 40 MG capsule TAKE 1 CAPSULE BY MOUTH DAILY. 30 capsule 2   propranolol (INDERAL) 40 MG tablet Take 40 mg by mouth daily as needed (heart palpitation.).      [START ON 02/18/2019] venlafaxine (EFFEXOR) 75 MG tablet Take 3 tablets (225 mg total) by mouth daily. 270 tablet 0   zolpidem (AMBIEN) 10 MG tablet Take 1 tablet (10 mg total) by mouth at bedtime as needed for sleep (1/2-1 tab). 30 tablet 0   No current facility-administered medications for this visit.     OBJECTIVE:   Vitals:   01/28/19 0841  BP: 125/70  Pulse: 85  Resp: 17  Temp: 98.5 F (36.9 C)  SpO2: 100%     Body mass index is 35.01 kg/m.   Wt Readings from Last 3 Encounters:  01/28/19 210 lb 6.4 oz (95.4 kg)  01/18/19 207 lb 12.8 oz (94.3 kg)  01/05/19 206 lb 6 oz (93.6 kg)  ECOG FS:1 - Symptomatic but completely ambulatory GENERAL: Patient is a tearful anxious appearing Good in no acute distress HEENT:  Sclerae anicteric.  Oropharynx clear and moist. No ulcerations or evidence of oropharyngeal candidiasis. Neck is supple.  NODES:  No cervical, supraclavicular, or axillary lymphadenopathy palpated.  BREAST EXAM:  Right breast s/p mastectomy and radiation.  No sign of recurrence, left breast benign LUNGS:  Clear to auscultation bilaterally.  No wheezes or rhonchi. HEART:  Regular rate and rhythm. No murmur  appreciated. ABDOMEN:  Soft, nontender.  Positive, normoactive bowel sounds. No organomegaly palpated. MSK:  No focal spinal tenderness to palpation. Full range of motion bilaterally in the upper extremities. EXTREMITIES:  No peripheral edema.   SKIN:  Clear with no obvious rashes or skin changes. No  nail dyscrasia. NEURO:  Nonfocal. Well oriented.  Appropriate affect.       LAB RESULTS:  CMP     Component Value Date/Time   NA 141 01/05/2019 0832   K 3.7 01/05/2019 0832   CL 107 01/05/2019 0832   CO2 24 01/05/2019 0832   GLUCOSE 106 (H) 01/05/2019 0832   BUN 25 (H) 01/05/2019 0832   CREATININE 0.86 01/05/2019 0832   CREATININE 0.83 09/24/2016 1151   CALCIUM 9.3 01/05/2019 0832   PROT 7.0 01/05/2019 0832   ALBUMIN 3.5 01/05/2019 0832   AST 14 (L) 01/05/2019 0832   ALT 19 01/05/2019 0832   ALKPHOS 90 01/05/2019 0832   BILITOT 0.3 01/05/2019 0832   GFRNONAA >60 01/05/2019 0832   GFRNONAA 79 09/24/2016 1151   GFRAA >60 01/05/2019 0832   GFRAA >89 09/24/2016 1151    No results found for: TOTALPROTELP, ALBUMINELP, A1GS, A2GS, BETS, BETA2SER, GAMS, MSPIKE, SPEI  No results found for: KPAFRELGTCHN, LAMBDASER, KAPLAMBRATIO  Lab Results  Component Value Date   WBC 6.6 01/28/2019   NEUTROABS 5.4 01/28/2019   HGB 10.0 (L) 01/28/2019   HCT 32.0 (L) 01/28/2019   MCV 92.5 01/28/2019   PLT 211 01/28/2019    @LASTCHEMISTRY @  No results found for: LABCA2  No components found for: [id number]  No results for input(s): INR in the last 168 hours.  No results found for: LABCA2  No results found for: HMC947  No results found for: SJG283  No results found for: MOQ947  No results found for: CA2729  No components found for: HGQUANT  No results found for: CEA1 / No results found for: CEA1   No results found for: AFPTUMOR  No results found for: CHROMOGRNA  No results found for: PSA1  Appointment on 01/28/2019  Component Date Value Ref Range Status   WBC 01/28/2019  6.6  4.0 - 10.5 K/uL Final   RBC 01/28/2019 3.46* 3.87 - 5.11 MIL/uL Final   Hemoglobin 01/28/2019 10.0* 12.0 - 15.0 g/dL Final   HCT 01/28/2019 32.0* 36.0 - 46.0 % Final   MCV 01/28/2019 92.5  80.0 - 100.0 fL Final   MCH 01/28/2019 28.9  26.0 - 34.0 pg Final   MCHC 01/28/2019 31.3  30.0 - 36.0 g/dL Final   RDW 01/28/2019 15.1  11.5 - 15.5 % Final   Platelets 01/28/2019 211  150 - 400 K/uL Final   nRBC 01/28/2019 0.0  0.0 - 0.2 % Final   Neutrophils Relative % 01/28/2019 81  % Final   Neutro Abs 01/28/2019 5.4  1.7 - 7.7 K/uL Final   Lymphocytes Relative 01/28/2019 10  % Final   Lymphs Abs 01/28/2019 0.7  0.7 - 4.0 K/uL Final   Monocytes Relative 01/28/2019 6  % Final   Monocytes Absolute 01/28/2019 0.4  0.1 - 1.0 K/uL Final   Eosinophils Relative 01/28/2019 2  % Final   Eosinophils Absolute 01/28/2019 0.1  0.0 - 0.5 K/uL Final   Basophils Relative 01/28/2019 0  % Final   Basophils Absolute 01/28/2019 0.0  0.0 - 0.1 K/uL Final   Immature Granulocytes 01/28/2019 1  % Final   Abs Immature Granulocytes 01/28/2019 0.03  0.00 - 0.07 K/uL Final   Performed at [facility], [place] [address] [place],  [address]    (this displays the last labs from the last 3 days)  No results found for: TOTALPROTELP, ALBUMINELP, A1GS, A2GS, BETS, BETA2SER, GAMS, MSPIKE, SPEI (this displays SPEP labs)  No results found for:  KPAFRELGTCHN, LAMBDASER, KAPLAMBRATIO (kappa/lambda light chains)  No results found for: HGBA, HGBA2QUANT, HGBFQUANT, HGBSQUAN (Hemoglobinopathy evaluation)   No results found for: LDH  No results found for: IRON, TIBC, IRONPCTSAT (Iron and TIBC)  No results found for: FERRITIN  Urinalysis    Component Value Date/Time   COLORURINE CANCELED 09/24/2016 1151   APPEARANCEUR CANCELED 09/24/2016 1151   LABSPEC CANCELED 09/24/2016 1151   PHURINE CANCELED 09/24/2016 1151   GLUCOSEU CANCELED 09/24/2016 1151   HGBUR CANCELED  09/24/2016 1151   BILIRUBINUR CANCELED 09/24/2016 1151   KETONESUR CANCELED 09/24/2016 1151   PROTEINUR CANCELED 09/24/2016 1151   NITRITE CANCELED 09/24/2016 1151   LEUKOCYTESUR CANCELED 09/24/2016 1151     STUDIES:  Echocardiogram results reviewed with the patient  ELIGIBLE FOR AVAILABLE RESEARCH PROTOCOL:BCEP   ASSESSMENT: 58 y.o. Jacqueline Good status post right breast upper outer quadrant biopsy 02/25/2018 for a clinical T2 pN1, stage IIB invasive ductal carcinoma, estrogen and progesterone receptor negative, HER-2 amplified, with an MIB-1 of 50%.  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 STK11, TP53, TSC1, TSC2, and VHL.  The following genes were evaluated for sequence changes only: SDHA and HOXB13 c.251G>A variant only.   (2) neoadjuvant chemotherapy consisting of carboplatin, docetaxel, trastuzumab and pertuzumab every 21 days x 6 starting 03/23/2018, completed 07/12/2018  (a) pertuzumab omitted after cycle 1 due to poorly-controlled diarrhea  (b) docetaxel discontinued after cycle 3 due to neuropathy; gemcitabine substituted  (c) Granix substituted for Neulasta on days 3-6 following chemotherapy due to improved tolerance  (d) Switched back to Neulasta after cycles 5 and 6 per patient request  (3) anti-HER-2 treatment will continue for 12 months (through AUG 2020)  (a) baseline echocardiogram 03/16/2018 shows an ejection fraction in the  55-60% range.  (b) echo on 11/15 shows EF of 55-60%  (4) status post right modified radical mastectomy 08/12/2018 showing a complete pathologic response (ypT0 ypN0)  (a) a total of 11 regional lymph nodes removed  (b) expander in place  (5) adjuvant radiation 11/15/2018-12/17/2018: Right chest wall 50 Gy in 25 fractions; Right CW, SCV,PAB: 50 Gy in 25 fractions  (6) left adrenal adenoma measuring 2.9 cm on CT of the chest 03/16/2018   PLAN: Jacqueline Good is doing moderately well today.  She has tolerated the Trastuzumab restart well and will continue on this every 3 weeks.  She will see Dr. Jana Hakim again after her next echocardiogram.  I reviewed this with her in detail.   . It is time to do a left breast screening mammogram on Jacqueline Good.  I placed orders for this today.  Jacqueline Good continues to struggle with her anxiety and depression.  She has met multiple times with Edwyna Shell, our social worker who is trying to get Jacqueline Good connected with resources such as therapy, etc for her issues.  She is meeting with Jacqueline Good again today.  I recommended Jacqueline Good continue to f/u with her psychiatrist about her medication management.  I reviewed exercise recommendations with Jacqueline Good.  I encouraged her that we are taking one day at a time and every  treatment and month gets [place] closer to her reconstruction and Trastuzumab completion.    Jacqueline Good will return every three weeks for labs and Trastuzumab.  She will see Dr. Jana Hakim again in 6 weeks.  She knows to call for any questions or concerns that may arise prior to her next appointment   A total of (20) minutes of face-to-face time was spent with this patient with greater than 50% of that time in counseling and care-coordination.   Wilber Bihari, NP  01/28/19 8:51 AM Medical Oncology and Hematology [facility] [address] [place], [place] [address] Tel. [phone]    Fax. [phone]

## 2019-01-28 NOTE — Patient Instructions (Signed)
[facility] Discharge Instructions for Patients Receiving Chemotherapy  Today you received the following chemotherapy agents Trastuzumab (HERCEPTIN).  To help prevent nausea and vomiting after your treatment, we encourage you to take your nausea medication as prescribed.  If you develop nausea and vomiting that is not controlled by your nausea medication, call the clinic.   BELOW ARE SYMPTOMS THAT SHOULD BE REPORTED IMMEDIATELY:  *FEVER GREATER THAN 100.5 F  *CHILLS WITH OR WITHOUT FEVER  NAUSEA AND VOMITING THAT IS NOT CONTROLLED WITH YOUR NAUSEA MEDICATION  *UNUSUAL SHORTNESS OF BREATH  *UNUSUAL BRUISING OR BLEEDING  TENDERNESS IN MOUTH AND THROAT WITH OR WITHOUT PRESENCE OF ULCERS  *URINARY PROBLEMS  *BOWEL PROBLEMS  UNUSUAL RASH Items with * indicate a potential emergency and should be followed up as soon as possible.  Feel free to call the clinic should you have any questions or concerns. The clinic phone number is [phone].  Please show the CHEMO ALERT CARD at check-in to the Emergency Department and triage nurse.  Coronavirus (COVID-19) Are you at risk?  Are you at risk for the Coronavirus (COVID-19)?  To be considered HIGH RISK for Coronavirus (COVID-19), you have to meet the following criteria:  . Traveled to [place], [place], [place], [place] or [place]; or in the [place] to [place], [place], [place], or [place]; and have fever, cough, and shortness of breath within the last 2 weeks of travel OR . Been in close contact with a person diagnosed with COVID-19 within the last 2 weeks and have fever, cough, and shortness of breath . IF YOU DO NOT MEET THESE CRITERIA, YOU ARE CONSIDERED LOW RISK FOR COVID-19.  What to do if you are HIGH RISK for COVID-19?  . If you are having a medical emergency, call 911. . Seek medical care right away. Before you go to a doctor's office, urgent care or emergency department, call ahead and tell them  about your recent travel, contact with someone diagnosed with COVID-19, and your symptoms. You should receive instructions from your physician's office regarding next steps of care.  . When you arrive at healthcare provider, tell the healthcare staff immediately you have returned from visiting [place], [place], [place], [place] or [place]; or traveled in the [place] to [place], [place], [place], or [place]; in the last two weeks or you have been in close contact with a person diagnosed with COVID-19 in the last 2 weeks.   . Tell the health care staff about your symptoms: fever, cough and shortness of breath. . After you have been seen by a medical provider, you will be either: o Tested for (COVID-19) and discharged home on quarantine except to seek medical care if symptoms worsen, and asked to  - Stay home and avoid contact with others until you get your results (4-5 days)  - Avoid travel on public transportation if possible (such as bus, train, or airplane) or o Sent to the Emergency Department by EMS for evaluation, COVID-19 testing, and possible admission depending on your condition and test results.  What to do if you are LOW RISK for COVID-19?  Reduce your risk of any infection by using the same precautions used for avoiding the common cold or flu:  . Wash your hands often with soap and warm water for at least 20 seconds.  If soap and water are not readily available, use an alcohol-based hand sanitizer with at least 60% alcohol.  . If coughing or sneezing,   cover your mouth and nose by coughing or sneezing into the elbow areas of your shirt or coat, into a tissue or into your sleeve (not your hands). . Avoid shaking hands with others and consider head nods or verbal greetings only. . Avoid touching your eyes, nose, or mouth with unwashed hands.  . Avoid close contact with people who are sick. . Avoid places or events with large numbers of people in one location, like concerts or  sporting events. . Carefully consider travel plans you have or are making. . If you are planning any travel outside or inside the [place], visit the CDC's Travelers' Health webpage for the latest health notices. . If you have some symptoms but not all symptoms, continue to monitor at home and seek medical attention if your symptoms worsen. . If you are having a medical emergency, call 911.   ADDITIONAL HEALTHCARE OPTIONS FOR PATIENTS  [facility] / e-Visit: [email or web address]         [facility]: [phone]  [facility]: [phone]                   [facility]: [phone]   

## 2019-01-30 ENCOUNTER — Encounter: Payer: Self-pay | Admitting: Oncology

## 2019-01-30 ENCOUNTER — Other Ambulatory Visit: Payer: Self-pay | Admitting: Oncology

## 2019-02-01 ENCOUNTER — Ambulatory Visit: Payer: Medicaid Other

## 2019-02-01 ENCOUNTER — Other Ambulatory Visit: Payer: Self-pay

## 2019-02-01 DIAGNOSIS — M25611 Stiffness of right shoulder, not elsewhere classified: Secondary | ICD-10-CM | POA: Diagnosis not present

## 2019-02-01 DIAGNOSIS — G8929 Other chronic pain: Secondary | ICD-10-CM

## 2019-02-01 DIAGNOSIS — I972 Postmastectomy lymphedema syndrome: Secondary | ICD-10-CM

## 2019-02-01 DIAGNOSIS — L599 Disorder of the skin and subcutaneous tissue related to radiation, unspecified: Secondary | ICD-10-CM

## 2019-02-01 DIAGNOSIS — M25511 Pain in right shoulder: Secondary | ICD-10-CM

## 2019-02-01 DIAGNOSIS — R293 Abnormal posture: Secondary | ICD-10-CM

## 2019-02-01 NOTE — Therapy (Signed)
[place], [place], [address] Phone: [phone]   Fax:  [phone]  Physical Therapy Treatment  Patient Details  Name: Jacqueline Good MRN: [id number] Date of Birth: 1961/04/29 Referring Provider (PT): [name]   Encounter Date: 02/01/2019  PT End of Session - 02/01/19 1052    Visit Number  4    Number of Visits  4    Date for PT Re-Evaluation  02/08/19   asking Medicaid for 6 more weeks (to 03/15/19)   Authorization Type  Medicaid- sending [place] for 3 visits in 30 day period good to 6/30; now pending Medicaid approval for more visits    PT Start Time  1006    PT Stop Time  1051    PT Time Calculation (min)  45 min    Activity Tolerance  Patient tolerated treatment well    Behavior During Therapy  [facility] for tasks assessed/performed       Past Medical History:  Diagnosis Date  . Anemia   . Anxiety   . Arthritis    "all over" (08/12/2018)  . Asthma   . Breast cancer, right breast ([place]) dx'd 02/2018  . Chronic bronchitis ([place])   . Depression   . Family history of breast cancer   . Family history of prostate cancer   . Family history of uterine cancer   . GERD (gastroesophageal reflux disease)   . History of blood transfusion    "several; related to low blood" (08/12/2018)  . History of radiation therapy 11/15/18- 12/17/18   Right Chest wall, SCV, PAB. 25 fractions.     Past Surgical History:  Procedure Laterality Date  . AXILLARY LYMPH NODE DISSECTION Right 08/12/2018   Procedure: AXILLARY LYMPH NODE DISSECTION;  Surgeon: Alphonsa Overall, MD;  Location: [place];  Service: General;  Laterality: Right;  . BREAST BIOPSY Right 03/2018  . BREAST RECONSTRUCTION WITH PLACEMENT OF TISSUE EXPANDER AND FLEX HD (ACELLULAR HYDRATED DERMIS) Right 08/12/2018   Procedure: RIGHT BREAST RECONSTRUCTION WITH PLACEMENT OF TISSUE EXPANDER AND FLEX HD (ACELLULAR HYDRATED DERMIS);  Surgeon: Wallace Going, DO;  Location: [place];  Service: Plastics;  Laterality: Right;  . DILATION AND CURETTAGE OF UTERUS    . ENDOMETRIAL ABLATION    . IR IMAGING GUIDED PORT INSERTION  03/18/2018  . MASTECTOMY MODIFIED RADICAL Right 08/12/2018   w/axillary LND  . MASTECTOMY MODIFIED RADICAL Right 08/12/2018   Procedure: RIGHT MODIFIED RADICAL MASTECTOMY;  Surgeon: Alphonsa Overall, MD;  Location: [place];  Service: General;  Laterality: Right;  . MYOMECTOMY    . REMOVAL OF TISSUE EXPANDER AND PLACEMENT OF IMPLANT Right 10/26/2018   Procedure: Removal of right breast expander;  Surgeon: Wallace Going, DO;  Location: [place];  Service: Plastics;  Laterality: Right;  75 min, please    There were no vitals filed for this visit.  Subjective Assessment - 02/01/19 1010    Subjective  I continue to feel better after each session. I'm able to reach better with less tightness and the chest spasms that were ocuring 1-2x/daily now are maybe every other day if that. I would really like to keep coming to PT. I have an appt to be measured for compression bras next week.    Pertinent History  Patient was diagnosed on 02/04/18 with right invasive ductal carcinoma breast cancer. It measures 3.1 cm and is located in the upper outer quadrant. It is ER/PR negative and HER2 positive with a Ki67 of 50%.  She has a known positive axillary lymph node. She has no other medical problems but does smoke 1 pack per day., 08/12/18- right mastectomy with ALND, 10/26/18- pt had expander removed because of infection, she has completed chemo and radiation    Patient Stated Goals  to get rid of these spasms    Currently in Pain?  No/denies                       [facility] Adult PT Treatment/Exercise - 02/01/19 0001      Manual Therapy   Manual Therapy  Soft tissue mobilization;Passive ROM;Myofascial release    Soft tissue mobilization  to Rt pectoralis muscle especially at axilla and insertion on sternum where pt has most tightness and pain    Myofascial Release   Rt UE pulling throughout P/ROM; crosshands technique horizontally at chest superior to incision near sternum, then hear axilla and at axilla. Pt toleratinfg this much better today.     Passive ROM  to Rt shoulder as tolerated in direction of flexion, abduction, D2 and ER                  PT Long Term Goals - 02/01/19 1006      PT LONG TERM GOAL #1   Title  Pt will report a 75% improvement in right chest pain to decrease discomfort caused by spasms.    Baseline  65% improvement reported at this time-02/01/19    Status  On-going      PT LONG TERM GOAL #2   Title  Pt will be independent in a home exercise program for continued strengthening and stretching    Baseline  Pt is independent with initial HEP for AA/ROM, is notyet ready for strength due to tightness/spasms-02/01/19    Status  On-going      PT LONG TERM GOAL #3   Title  Pt will report a 25% improvement in swelling in right chest to allow improved comfort.    Baseline  30% improvement reported at this time-02/01/19    Status  Achieved      PT LONG TERM GOAL #4   Title  Pt will demonstrate 150 degrees of right shoulder flexion to allow her to reach overhead.    Baseline  116; 126 degrees- 02/01/19    Status  On-going      PT LONG TERM GOAL #5   Title  Pt will demonstrate 140 degrees of right shoulder abduction to allow her to reach out to the side.    Baseline  89; 106 degrees - 02/01/19    Status  On-going            Plan - 02/01/19 1054    Clinical Impression Statement  [name] continues to show improvements at each session with sensitivity improving along with A/P/ROM. She is doing well with supine dowel exercises. Did not progress HEP to include strength yet as she is still tight at end ROM and was still showing signs of sensitivity at end of motions. Pt would like to cont therapy but needs Medicaid renewal at this time. it does seem appropriate to cont as she is progressing well towards goals with reports of  discomfort improving as well as her A/ROM. She will also benefit from progression of HEP once able to tolerate strengthening.    Personal Factors and Comorbidities  Comorbidity 1;Comorbidity 2;Other    Comorbidities  depression, anxiety, lives alone    Examination-Activity Limitations  Reach Overhead;Carry  Examination-Participation Restrictions  Cleaning    Stability/Clinical Decision Making  Evolving/Moderate complexity    Rehab Potential  Good    Clinical Impairments Affecting Rehab Potential  hx of radiation, hx of infections in right chest    PT Frequency  1x / week    PT Duration  6 weeks    PT Treatment/Interventions  ADLs/Self Care Home Management;Therapeutic exercise;Patient/family education;Orthotic Fit/Training;Manual techniques;Manual lymph drainage;Scar mobilization;Passive range of motion;Taping;Joint Manipulations    PT Next Visit Plan  Medicaid renewal done this session. Cont gentle P/ROM of Rt shoulder, and STM to Rt chest and scar massage; see if pt able to tolerate supine scapular series next session.    Consulted and Agree with Plan of Care  Patient       Patient will benefit from skilled therapeutic intervention in order to improve the following deficits and impairments:  Decreased knowledge of precautions, Impaired UE functional use, Decreased range of motion, Postural dysfunction, Pain, Decreased scar mobility, Increased fascial restricitons, Decreased strength, Increased edema  Visit Diagnosis: 1. Stiffness of right shoulder, not elsewhere classified   2. Disorder of the skin and subcutaneous tissue related to radiation, unspecified   3. Chronic right shoulder pain   4. Postmastectomy lymphedema   5. Abnormal posture        Problem List Patient Active Problem List   Diagnosis Date Noted  . Genetic testing 10/01/2018  . Family history of breast cancer   . Family history of uterine cancer   . Family history of prostate cancer   . S/P mastectomy, right  08/20/2018  . Acquired absence of right breast 08/20/2018  . Port-A-Cath in place 06/21/2018  . MDD (major depressive disorder), recurrent episode, moderate ([place]) 06/13/2018  . Generalized anxiety disorder 06/13/2018  . OCD (obsessive compulsive disorder) 06/13/2018  . Insomnia 06/13/2018  . Malignant neoplasm of upper-outer quadrant of right breast in female, estrogen receptor negative ([place]) 03/02/2018  . Needs flu shot 06/03/2017  . Chronic bilateral low back pain without sciatica 06/03/2017  . Adjustment disorder with anxious mood 03/27/2016  . Benign paroxysmal positional vertigo 10/31/2014  . Arthritis 04/27/2014  . Fatigue 06/10/2013  . Morbid obesity ([place]) 06/10/2013  . Asthma, chronic obstructive, with acute exacerbation ([place]) 06/10/2013  . Allergy 06/10/2013    Otelia Limes, PTA 02/01/2019, 11:05 AM  [place], [place], [address] Phone: [phone]   Fax:  [phone]  Name: NARCISSA MELDER MRN: [id number] Date of Birth: Jan 13, 1961

## 2019-02-02 ENCOUNTER — Encounter: Payer: Self-pay | Admitting: General Practice

## 2019-02-02 NOTE — Progress Notes (Signed)
[facility] CSW Progress Notes  Referral made to [facility] [phone]) for therapy.  Edwyna Shell, LCSW Clinical Social Worker Phone:  [phone]

## 2019-02-09 ENCOUNTER — Encounter: Payer: Self-pay | Admitting: General Practice

## 2019-02-09 NOTE — Progress Notes (Deleted)
[facility] MD/PA/NP OP Progress Note  02/09/2019 1:55 PM Jacqueline Good  MRN:  [id number]  Chief Complaint:  HPI:  - She was transferred to neuropsychiatric care center for therapy    Visit Diagnosis: No diagnosis found.  Past Psychiatric History: Please see initial evaluation for full details. I have reviewed the history. No updates at this time.     Past Medical History:  Past Medical History:  Diagnosis Date  . Anemia   . Anxiety   . Arthritis    "all over" (08/12/2018)  . Asthma   . Breast cancer, right breast ([place]) dx'd 02/2018  . Chronic bronchitis ([place])   . Depression   . Family history of breast cancer   . Family history of prostate cancer   . Family history of uterine cancer   . GERD (gastroesophageal reflux disease)   . History of blood transfusion    "several; related to low blood" (08/12/2018)  . History of radiation therapy 11/15/18- 12/17/18   Right Chest wall, SCV, PAB. 25 fractions.     Past Surgical History:  Procedure Laterality Date  . AXILLARY LYMPH NODE DISSECTION Right 08/12/2018   Procedure: AXILLARY LYMPH NODE DISSECTION;  Surgeon: Alphonsa Overall, MD;  Location: [facility];  Service: General;  Laterality: Right;  . BREAST BIOPSY Right 03/2018  . BREAST RECONSTRUCTION WITH PLACEMENT OF TISSUE EXPANDER AND FLEX HD (ACELLULAR HYDRATED DERMIS) Right 08/12/2018   Procedure: RIGHT BREAST RECONSTRUCTION WITH PLACEMENT OF TISSUE EXPANDER AND FLEX HD (ACELLULAR HYDRATED DERMIS);  Surgeon: Wallace Going, DO;  Location: [place];  Service: Plastics;  Laterality: Right;  . DILATION AND CURETTAGE OF UTERUS    . ENDOMETRIAL ABLATION    . IR IMAGING GUIDED PORT INSERTION  03/18/2018  . MASTECTOMY MODIFIED RADICAL Right 08/12/2018   w/axillary LND  . MASTECTOMY MODIFIED RADICAL Right 08/12/2018   Procedure: RIGHT MODIFIED RADICAL MASTECTOMY;  Surgeon: Alphonsa Overall, MD;  Location: [facility];  Service: General;  Laterality: Right;  . MYOMECTOMY    . REMOVAL OF TISSUE EXPANDER AND PLACEMENT  OF IMPLANT Right 10/26/2018   Procedure: Removal of right breast expander;  Surgeon: Wallace Going, DO;  Location: [facility] [place];  Service: Plastics;  Laterality: Right;  75 min, please    Family Psychiatric History: Please see initial evaluation for full details. I have reviewed the history. No updates at this time.     Family History:  Family History  Problem Relation Age of Onset  . Lupus Sister   . Heart disease Sister   . Breast cancer Mother 3  . Prostate cancer Paternal Uncle   . Diabetes Maternal Grandmother   . Heart Problems Maternal Grandmother   . Prostate cancer Maternal Grandfather   . Prostate cancer Paternal Grandfather   . Prostate cancer Other        MGFs brother  . Breast cancer Other        Mat great-grandmother's sister  . Uterine cancer Maternal Great-grandmother   . Prostate cancer Other        PGFs brother    Social History:  Social History   Socioeconomic History  . Marital status: Single    Spouse name: Not on file  . Number of children: Not on file  . Years of education: Not on file  . Highest education level: Not on file  Occupational History  . Not on file  Social Needs  . Financial resource strain: Not on file  . Food insecurity    Worry:  Not on file    Inability: Not on file  . Transportation needs    Medical: No    Non-medical: No  Tobacco Use  . Smoking status: Former Smoker    Packs/day: 1.00    Years: 38.00    Pack years: 38.00    Types: Cigarettes    Quit date: 05/15/2018    Years since quitting: 0.7  . Smokeless tobacco: Never Used  Substance and Sexual Activity  . Alcohol use: Not Currently    Comment: 08/12/2018 "nothing since 04/2018"  . Drug use: No  . Sexual activity: Not Currently    Birth control/protection: None  Lifestyle  . Physical activity    Days per week: Not on file    Minutes per session: Not on file  . Stress: Not on file  Relationships  . Social Herbalist on phone: Not on file    Gets  together: Not on file    Attends religious service: Not on file    Active member of club or organization: Not on file    Attends meetings of clubs or organizations: Not on file    Relationship status: Not on file  Other Topics Concern  . Not on file  Social History Narrative  . Not on file    Allergies:  Allergies  Allergen Reactions  . Gadolinium Derivatives Itching and Cough    Pt began sneezing and coughing as soon as MRI contrast was injected. Severe nasal congestion. No rash or hives no SOB.   Marland Kitchen Hydrocodone Hives and Itching    Pt says she can take it with benadryl.   . Iodinated Diagnostic Agents Itching    Metabolic Disorder Labs: Lab Results  Component Value Date   HGBA1C 5.4 04/27/2014   No results found for: PROLACTIN Lab Results  Component Value Date   CHOL 159 09/24/2016   TRIG 179 (H) 09/24/2016   HDL 47 (L) 09/24/2016   CHOLHDL 3.4 09/24/2016   VLDL 36 (H) 09/24/2016   LDLCALC 76 09/24/2016   LDLCALC 86 04/27/2014   Lab Results  Component Value Date   TSH 0.58 09/24/2016   TSH 0.696 04/27/2014    Therapeutic Level Labs: No results found for: LITHIUM No results found for: VALPROATE No components found for:  CBMZ  Current Medications: Current Outpatient Medications  Medication Sig Dispense Refill  . albuterol (VENTOLIN HFA) 108 (90 Base) MCG/ACT inhaler INHALE 2 PUFFS INTO THE LUNGS EVERY 6 HOURS AS NEEDED FOR WHEEZING 18 g 2  . ALPRAZolam (XANAX) 1 MG tablet 1 po q 6 hr prn.  No more than 3.5 qd. 105 tablet 0  . ARIPiprazole (ABILIFY) 2 MG tablet Take 1 tablet (2 mg total) by mouth daily. 90 tablet 0  . benzonatate (TESSALON PERLES) 100 MG capsule Take 1 capsule (100 mg total) by mouth 3 (three) times daily as needed for cough. 20 capsule 0  . diphenoxylate-atropine (LOMOTIL) 2.5-0.025 MG tablet Take 1 tablet by mouth 4 (four) times daily as needed for diarrhea or loose stools. 30 tablet 0  . esomeprazole (NEXIUM) 40 MG capsule TAKE ONE CAPSULE BY  MOUTH DAILY 30 capsule 2  . fluticasone (FLONASE) 50 MCG/ACT nasal spray Place 2 sprays into both nostrils daily as needed for up to 14 days for allergies. 1 g 1  . Fluticasone-Salmeterol (ADVAIR DISKUS) 250-50 MCG/DOSE AEPB INHALE 1 PUFF INTO THE LUNGS 2 TIMES DAILY. 60 each 2  . lidocaine-prilocaine (EMLA) cream Apply to affected area once (  Patient taking differently: Apply 1 application topically daily as needed (prior to port being flushed.). ) 30 g 3  . loratadine (CLARITIN) 10 MG tablet Take 1 tablet (10 mg total) by mouth daily as needed for allergies. 30 tablet 11  . naproxen (NAPROSYN) 500 MG tablet As needed.    Marland Kitchen omeprazole (PRILOSEC) 40 MG capsule TAKE 1 CAPSULE BY MOUTH DAILY. 30 capsule 2  . propranolol (INDERAL) 40 MG tablet Take 40 mg by mouth daily as needed (heart palpitation.).     Derrill Memo ON 02/18/2019] venlafaxine (EFFEXOR) 75 MG tablet Take 3 tablets (225 mg total) by mouth daily. 270 tablet 0  . zolpidem (AMBIEN) 10 MG tablet Take 1 tablet (10 mg total) by mouth at bedtime as needed for sleep (1/2-1 tab). 30 tablet 0   No current facility-administered medications for this visit.      Musculoskeletal: Strength & Muscle Tone: N/A Gait & Station: N/A Patient leans: N/A  Psychiatric Specialty Exam: ROS  There were no vitals taken for this visit.There is no height or weight on file to calculate BMI.  General Appearance: {Appearance:22683}  Eye Contact:  {BHH EYE CONTACT:22684}  Speech:  Clear and Coherent  Volume:  Normal  Mood:  {BHH MOOD:22306}  Affect:  {Affect (PAA):22687}  Thought Process:  Coherent  Orientation:  Full (Time, Place, and Person)  Thought Content: Logical   Suicidal Thoughts:  {ST/HT (PAA):22692}  Homicidal Thoughts:  {ST/HT (PAA):22692}  Memory:  Immediate;   Good  Judgement:  {Judgement (PAA):22694}  Insight:  {Insight (PAA):22695}  Psychomotor Activity:  Normal  Concentration:  Concentration: Good and Attention Span: Good  Recall:  Good   Fund of Knowledge: Good  Language: Good  Akathisia:  No  Handed:  Right  AIMS (if indicated): not done  Assets:  Communication Skills Desire for Improvement  ADL's:  Intact  Cognition: WNL  Sleep:  {BHH GOOD/FAIR/POOR:22877}   Screenings: GAD-7     Office Visit from 04/22/2018 in [facility] Office Visit from 06/03/2017 in [facility] Office Visit from 09/24/2016 in [facility] Office Visit from 03/27/2016 in [facility]  Total GAD-7 Score  11  3  15  16     PHQ2-9     Office Visit from 04/22/2018 in [facility] Office Visit from 06/03/2017 in [facility] Office Visit from 09/24/2016 in [facility] Office Visit from 05/01/2016 in [facility] Office Visit from 03/27/2016 in [facility]  PHQ-2 Total Score  5  0  2  5  2   PHQ-9 Total Score  15  4  7  8  10        Assessment and Plan:  CHIANA WAMSER is a 58 y.o. year old female with a history of depression, OCD, estrogen negativeT2 pN1, stage IIB invasive ductal carcinoma, diagnosed07/18/2019s/p mastectomy1/2020,neoadjuvantchemo immunotherapy/on trastzumab,asthma, OA, chronic back pain  , who presents for follow up appointment for depression.   # MDD, moderate, recurrent without psychotic features # r/o PTSD She continues to report depressive symptoms and anxiety despite up titration of Abilify.  Psychosocial stressors includes medical condition of breast cancer and employment.  She also reports some traumatic experience at her previous work, although she declined to elaborate it.  Will taper down Abilify as adjunctive treatment for depression given she had  some adverse reaction from higher dose.  Will continue venlafaxine to target depression.  Although she will benefit  from adjunctive treatment, she does not want to try anything which could cause weight gain.  She also declined to see a therapist.  It is considered that it is important to build more rapport before making any further medication change.  Will continue Xanax as needed for anxiety.  Discussed risk of dependence and oversedation.  Will continue Ambien as needed for insomnia.   Plan  1. Continue venlafaxine 225 mg daily 2.Decrease Abilify 2 mg (limited benefit from higher dose) 3. Continue Xanax 1 mg three times a day (and additional 0.5 mg daily) as needed for anxiety  4. Continue Ambien 10 mg at night as needed for insomnia 5. Next appointment: 7/8 at 11 AM for 30 mins, video  Past trials of medication: sertraline, fluoxetine, lexapro, buspar, venlafaxine, duloxetine, bupropion, quetiapine  The patient demonstrates the following risk factors for suicide: Chronic risk factors for suicide include:psychiatric disorder ofdepressionand medical illness of breast cancer. Acute risk factorsfor suicide include: unemployment and loss (financial, interpersonal, professional). Protective factorsfor this patient include: positive social support, responsibility to others (children, family), coping skills and hope for the future. Considering these factors, the overall suicide risk at this point appears to below. Patientisappropriate for outpatient follow up.  Norman Clay, MD 02/09/2019, 1:55 PM

## 2019-02-09 NOTE — Progress Notes (Signed)
[facility] Progress Notes  Communication received from [facility] - patient declined to schedule an appointment, says she will "try to find a provider that has an earlier appointment."  Per [facility] staff, earliest available appointment is at end of July 2020.  Edwyna Shell, LCSW Clinical Social Worker Phone:  [phone]

## 2019-02-10 ENCOUNTER — Encounter: Payer: Self-pay | Admitting: General Practice

## 2019-02-10 NOTE — Progress Notes (Signed)
[facility] Progress Notes  Message from desk nurse - patient declined to schedule therapy appt at [facility] for therapy as she felt that the appointment was "too far away."  Wants something sooner. Tried to call patient to discuss - left VM for her w my contact info and encouragement to schedule w provider as therapy appointments often have a 4 - 6 week wait due to lack of availability.  Edwyna Shell, LCSW Clinical Social Worker Phone:  [phone]

## 2019-02-16 ENCOUNTER — Other Ambulatory Visit: Payer: Self-pay

## 2019-02-16 ENCOUNTER — Ambulatory Visit (INDEPENDENT_AMBULATORY_CARE_PROVIDER_SITE_OTHER): Payer: Medicaid Other | Admitting: Psychiatry

## 2019-02-16 ENCOUNTER — Encounter (HOSPITAL_COMMUNITY): Payer: Self-pay | Admitting: Psychiatry

## 2019-02-16 ENCOUNTER — Ambulatory Visit (HOSPITAL_COMMUNITY): Payer: Medicaid Other | Admitting: Psychiatry

## 2019-02-16 DIAGNOSIS — F331 Major depressive disorder, recurrent, moderate: Secondary | ICD-10-CM

## 2019-02-16 MED ORDER — MIRTAZAPINE 15 MG PO TABS: ORAL_TABLET | ORAL | 1 refills | Status: DC

## 2019-02-16 MED ORDER — ALPRAZOLAM 1 MG PO TABS: ORAL_TABLET | ORAL | 0 refills | Status: DC

## 2019-02-16 MED ORDER — ZOLPIDEM TARTRATE 10 MG PO TABS: 10.0000 mg | ORAL_TABLET | Freq: Every evening | ORAL | 1 refills | Status: DC | PRN

## 2019-02-16 MED FILL — MIRTAZAPINE 15 MG TABLET: 15 | 33 days supply | Qty: 30 | Fill #0

## 2019-02-16 MED FILL — ZOLPIDEM TARTRATE 10 MG TAB: 10 | 30 days supply | Qty: 30 | Fill #0

## 2019-02-16 MED FILL — ALPRAZolam 1 MG TABS: 1 | 30 days supply | Qty: 105 | Fill #0

## 2019-02-16 NOTE — Patient Instructions (Signed)
1. Continue venlafaxine 225 mg daily 2.Continue Abilify 2 mg  3. Start mirtazapine 7.5 mg at night for one week, then 15 mg at night  4. Continue Xanax 1 mg three times a day (and additional 0.5 mg daily) as needed for anxiety  4. Continue Ambien 10 mg at night as needed for insomnia 5. Next appointment: 8/19 at 11 AM

## 2019-02-16 NOTE — Progress Notes (Signed)
Virtual Visit via Video Note  I connected with Jacqueline Good on 02/16/19 at 11:30 AM EDT by a video enabled telemedicine application and verified that I am speaking with the correct person using two identifiers.   I discussed the limitations of evaluation and management by telemedicine and the availability of in person appointments. The patient expressed understanding and agreed to proceed.     I discussed the assessment and treatment plan with the patient. The patient was provided an opportunity to ask questions and all were answered. The patient agreed with the plan and demonstrated an understanding of the instructions.   The patient was advised to call back or seek an in-person evaluation if the symptoms worsen or if the condition fails to improve as anticipated.  I provided 15 minutes of non-face-to-face time during this encounter.   Norman Clay, MD    [facility] MD/PA/NP OP Progress Note  02/16/2019 11:55 AM Jacqueline Good  MRN:  [id number]  Chief Complaint:  Chief Complaint    Depression     HPI:  This is a follow-up appointment for depression.  She states that she has been feeling "okay." She went to get COVID testing as she "wanted," while she denies any physical symptoms to be concerned of infection. She has been doing some exercise on bed to alleviate muscle tension in her chest. She goes to PT regularly.  She states that she has crying spells many times "because I'm depressed." When she is asked to elaborate it, she answers "I don't like this talk therapy." She declines to elaborate most of the story. When she is asked about referral made for therapy, she states that she would not go as  she does not like therapy. Although she felt comfortable with her previous provider, she could not continue to see the one due to insurance. She has no interest in seeing anyone as she does not feel comfortable.  She has middle insomnia.  She feels fatigue.  She has fair concentration.  She has  fair appetite.  She denies SI.  She feels anxious and tense at times.  She takes Xanax 3 times a day.  She denies panic attacks.   Visit Diagnosis: No diagnosis found.  Past Psychiatric History: Please see initial evaluation for full details. I have reviewed the history. No updates at this time.     Past Medical History:  Past Medical History:  Diagnosis Date  . Anemia   . Anxiety   . Arthritis    "all over" (08/12/2018)  . Asthma   . Breast cancer, right breast ([place]) dx'd 02/2018  . Chronic bronchitis ([place])   . Depression   . Family history of breast cancer   . Family history of prostate cancer   . Family history of uterine cancer   . GERD (gastroesophageal reflux disease)   . History of blood transfusion    "several; related to low blood" (08/12/2018)  . History of radiation therapy 11/15/18- 12/17/18   Right Chest wall, SCV, PAB. 25 fractions.     Past Surgical History:  Procedure Laterality Date  . AXILLARY LYMPH NODE DISSECTION Right 08/12/2018   Procedure: AXILLARY LYMPH NODE DISSECTION;  Surgeon: Alphonsa Overall, MD;  Location: [place];  Service: General;  Laterality: Right;  . BREAST BIOPSY Right 03/2018  . BREAST RECONSTRUCTION WITH PLACEMENT OF TISSUE EXPANDER AND FLEX HD (ACELLULAR HYDRATED DERMIS) Right 08/12/2018   Procedure: RIGHT BREAST RECONSTRUCTION WITH PLACEMENT OF TISSUE EXPANDER AND FLEX HD (ACELLULAR HYDRATED DERMIS);  Surgeon: Wallace Going, DO;  Location: [place];  Service: Plastics;  Laterality: Right;  . DILATION AND CURETTAGE OF UTERUS    . ENDOMETRIAL ABLATION    . IR IMAGING GUIDED PORT INSERTION  03/18/2018  . MASTECTOMY MODIFIED RADICAL Right 08/12/2018   w/axillary LND  . MASTECTOMY MODIFIED RADICAL Right 08/12/2018   Procedure: RIGHT MODIFIED RADICAL MASTECTOMY;  Surgeon: Alphonsa Overall, MD;  Location: [place];  Service: General;  Laterality: Right;  . MYOMECTOMY    . REMOVAL OF TISSUE EXPANDER AND PLACEMENT OF IMPLANT Right 10/26/2018   Procedure: Removal of  right breast expander;  Surgeon: Wallace Going, DO;  Location: [place];  Service: Plastics;  Laterality: Right;  75 min, please    Family Psychiatric History: Please see initial evaluation for full details. I have reviewed the history. No updates at this time.     Family History:  Family History  Problem Relation Age of Onset  . Lupus Sister   . Heart disease Sister   . Breast cancer Mother 76  . Prostate cancer Paternal Uncle   . Diabetes Maternal Grandmother   . Heart Problems Maternal Grandmother   . Prostate cancer Maternal Grandfather   . Prostate cancer Paternal Grandfather   . Prostate cancer Other        MGFs brother  . Breast cancer Other        Mat great-grandmother's sister  . Uterine cancer Maternal Great-grandmother   . Prostate cancer Other        PGFs brother    Social History:  Social History   Socioeconomic History  . Marital status: Single    Spouse name: Not on file  . Number of children: Not on file  . Years of education: Not on file  . Highest education level: Not on file  Occupational History  . Not on file  Social Needs  . Financial resource strain: Not on file  . Food insecurity    Worry: Not on file    Inability: Not on file  . Transportation needs    Medical: No    Non-medical: No  Tobacco Use  . Smoking status: Former Smoker    Packs/day: 1.00    Years: 38.00    Pack years: 38.00    Types: Cigarettes    Quit date: 05/15/2018    Years since quitting: 0.7  . Smokeless tobacco: Never Used  Substance and Sexual Activity  . Alcohol use: Not Currently    Comment: 08/12/2018 "nothing since 04/2018"  . Drug use: No  . Sexual activity: Not Currently    Birth control/protection: None  Lifestyle  . Physical activity    Days per week: Not on file    Minutes per session: Not on file  . Stress: Not on file  Relationships  . Social Herbalist on phone: Not on file    Gets together: Not on file    Attends religious service:  Not on file    Active member of club or organization: Not on file    Attends meetings of clubs or organizations: Not on file    Relationship status: Not on file  Other Topics Concern  . Not on file  Social History Narrative  . Not on file    Allergies:  Allergies  Allergen Reactions  . Gadolinium Derivatives Itching and Cough    Pt began sneezing and coughing as soon as MRI contrast was injected. Severe nasal congestion. No rash or hives  no SOB.   Marland Kitchen Hydrocodone Hives and Itching    Pt says she can take it with benadryl.   . Iodinated Diagnostic Agents Itching    Metabolic Disorder Labs: Lab Results  Component Value Date   HGBA1C 5.4 04/27/2014   No results found for: PROLACTIN Lab Results  Component Value Date   CHOL 159 09/24/2016   TRIG 179 (H) 09/24/2016   HDL 47 (L) 09/24/2016   CHOLHDL 3.4 09/24/2016   VLDL 36 (H) 09/24/2016   LDLCALC 76 09/24/2016   LDLCALC 86 04/27/2014   Lab Results  Component Value Date   TSH 0.58 09/24/2016   TSH 0.696 04/27/2014    Therapeutic Level Labs: No results found for: LITHIUM No results found for: VALPROATE No components found for:  CBMZ  Current Medications: Current Outpatient Medications  Medication Sig Dispense Refill  . albuterol (VENTOLIN HFA) 108 (90 Base) MCG/ACT inhaler INHALE 2 PUFFS INTO THE LUNGS EVERY 6 HOURS AS NEEDED FOR WHEEZING 18 g 2  . ALPRAZolam (XANAX) 1 MG tablet 1 po q 6 hr prn.  No more than 3.5 qd. 105 tablet 0  . [START ON 03/18/2019] ALPRAZolam (XANAX) 1 MG tablet 1 mg three times and 0.5 mg per day as needed for anxiety. No more than 3.5 mg per day 105 tablet 0  . ARIPiprazole (ABILIFY) 2 MG tablet Take 1 tablet (2 mg total) by mouth daily. 90 tablet 0  . benzonatate (TESSALON PERLES) 100 MG capsule Take 1 capsule (100 mg total) by mouth 3 (three) times daily as needed for cough. 20 capsule 0  . diphenoxylate-atropine (LOMOTIL) 2.5-0.025 MG tablet Take 1 tablet by mouth 4 (four) times daily as needed  for diarrhea or loose stools. 30 tablet 0  . esomeprazole (NEXIUM) 40 MG capsule TAKE ONE CAPSULE BY MOUTH DAILY 30 capsule 2  . fluticasone (FLONASE) 50 MCG/ACT nasal spray Place 2 sprays into both nostrils daily as needed for up to 14 days for allergies. 1 g 1  . Fluticasone-Salmeterol (ADVAIR DISKUS) 250-50 MCG/DOSE AEPB INHALE 1 PUFF INTO THE LUNGS 2 TIMES DAILY. 60 each 2  . lidocaine-prilocaine (EMLA) cream Apply to affected area once (Patient taking differently: Apply 1 application topically daily as needed (prior to port being flushed.). ) 30 g 3  . loratadine (CLARITIN) 10 MG tablet Take 1 tablet (10 mg total) by mouth daily as needed for allergies. 30 tablet 11  . mirtazapine (REMERON) 15 MG tablet 7.5 mg at night for one week, then 15 mg at night 30 tablet 1  . naproxen (NAPROSYN) 500 MG tablet As needed.    Marland Kitchen omeprazole (PRILOSEC) 40 MG capsule TAKE 1 CAPSULE BY MOUTH DAILY. 30 capsule 2  . propranolol (INDERAL) 40 MG tablet Take 40 mg by mouth daily as needed (heart palpitation.).     Derrill Memo ON 02/18/2019] venlafaxine (EFFEXOR) 75 MG tablet Take 3 tablets (225 mg total) by mouth daily. 270 tablet 0  . zolpidem (AMBIEN) 10 MG tablet Take 1 tablet (10 mg total) by mouth at bedtime as needed for sleep (1/2-1 tab). 30 tablet 1   No current facility-administered medications for this visit.      Musculoskeletal: Strength & Muscle Tone: N/A Gait & Station: N/A Patient leans: N/A  Psychiatric Specialty Exam: Review of Systems  Psychiatric/Behavioral: Positive for depression. Negative for hallucinations, memory loss, substance abuse and suicidal ideas. The patient is nervous/anxious and has insomnia.   All other systems reviewed and are negative.   There  were no vitals taken for this visit.There is no height or weight on file to calculate BMI.  General Appearance: Fairly Groomed  Eye Contact:  Good  Speech:  Clear and Coherent  Volume:  Normal  Mood:  Depressed  Affect:   Appropriate, Congruent, Restricted and irritable  Thought Process:  Coherent  Orientation:  Full (Time, Place, and Person)  Thought Content: Logical   Suicidal Thoughts:  No  Homicidal Thoughts:  No  Memory:  Immediate;   Good  Judgement:  Fair  Insight:  Present  Psychomotor Activity:  Normal  Concentration:  Concentration: Good and Attention Span: Good  Recall:  Good  Fund of Knowledge: Good  Language: Good  Akathisia:  No  Handed:  Right  AIMS (if indicated): not done  Assets:  Communication Skills Desire for Improvement  ADL's:  Intact  Cognition: WNL  Sleep:  Poor   Screenings: GAD-7     Office Visit from 04/22/2018 in [place] Office Visit from 06/03/2017 in [place] Office Visit from 09/24/2016 in [place] Office Visit from 03/27/2016 in [place]  Total GAD-7 Score  11  3  15  16     PHQ2-9     Office Visit from 04/22/2018 in [place] Office Visit from 06/03/2017 in [place] Office Visit from 09/24/2016 in [place] Office Visit from 05/01/2016 in [place] Office Visit from 03/27/2016 in [facility]  PHQ-2 Total Score  5  0  2  5  2   PHQ-9 Total Score  15  4  7  8  10        Assessment and Plan:  Jacqueline Good is a 58 y.o. year old female with a history of depression, OCD, estrogen negativeT2 pN1, stage IIB invasive ductal carcinoma, diagnosed07/18/2019s/p mastectomy1/2020,neoadjuvantchemo immunotherapy/on trastzumab,asthma, OA, chronic back pain, who presents for follow up appointment for depression.   # MDD, moderate, recurrent without psychotic features # r/o PTSD She continues to report depressive symptoms and anxiety since the last visit.  Although she declines to elaborate in  details, psychosocial stressors includes medical condition breast cancer, and unemployment.  She also vaguely reports some trauma at her previous work.  Will start mirtazapine as adjunctive treatment for depression and also to target insomnia.  Discussed potential side effect of drowsiness and increase in appetite.  Will continue venlafaxine to target depression.  Will continue Abilify as adjunctive treatment for depression.  Discussed risk of EPS and metabolic side effect.  Will continue Xanax as needed for anxiety.  Discussed risk of dependence and oversedation. Will continue Ambien as needed for insomnia.  Although she will greatly benefit from therapy, she declines this option.   Plan I have reviewed and updated plans as below 1. Continue venlafaxine 225 mg daily 2.Continue Abilify 2 mg (limited benefit from higher dose) 3. Start mirtazapine 7.5 mg at night for one week, then 15 mg at night  4. Continue Xanax 1 mg three times a day (and additional 0.5 mg daily) as needed for anxiety  4. Continue Ambien 10 mg at night as needed for insomnia 5. Next appointment: 8/19 at 11 AM for 20 mins, video  Past trials of medication: sertraline, fluoxetine, lexapro, buspar, venlafaxine, duloxetine, bupropion, quetiapine  The patient demonstrates the following risk factors for suicide:  Chronic risk factors for suicide include:psychiatric disorder ofdepressionand medical illness of breast cancer. Acute risk factorsfor suicide include: unemployment and loss (financial, interpersonal, professional). Protective factorsfor this patient include: positive social support, responsibility to others (children, family), coping skills and hope for the future. Considering these factors, the overall suicide risk at this point appears to below. Patientisappropriate for outpatient follow up.  Norman Clay, MD 02/16/2019, 11:54 AM

## 2019-02-17 ENCOUNTER — Telehealth: Payer: Self-pay | Admitting: Medical

## 2019-02-17 ENCOUNTER — Other Ambulatory Visit: Payer: Self-pay | Admitting: Medical

## 2019-02-17 ENCOUNTER — Inpatient Hospital Stay: Payer: Medicaid Other

## 2019-02-17 ENCOUNTER — Inpatient Hospital Stay: Payer: Medicaid Other | Attending: Oncology

## 2019-02-17 ENCOUNTER — Other Ambulatory Visit: Payer: Self-pay

## 2019-02-17 ENCOUNTER — Inpatient Hospital Stay (HOSPITAL_BASED_OUTPATIENT_CLINIC_OR_DEPARTMENT_OTHER): Payer: Medicaid Other | Admitting: Medical

## 2019-02-17 VITALS — BP 113/61 | HR 66 | Temp 98.7°F | Resp 20

## 2019-02-17 DIAGNOSIS — Z79899 Other long term (current) drug therapy: Secondary | ICD-10-CM | POA: Diagnosis not present

## 2019-02-17 DIAGNOSIS — Z5112 Encounter for antineoplastic immunotherapy: Secondary | ICD-10-CM | POA: Diagnosis present

## 2019-02-17 DIAGNOSIS — Z171 Estrogen receptor negative status [ER-]: Secondary | ICD-10-CM

## 2019-02-17 DIAGNOSIS — C50411 Malignant neoplasm of upper-outer quadrant of right female breast: Secondary | ICD-10-CM | POA: Diagnosis present

## 2019-02-17 DIAGNOSIS — J45901 Unspecified asthma with (acute) exacerbation: Secondary | ICD-10-CM

## 2019-02-17 DIAGNOSIS — Z87891 Personal history of nicotine dependence: Secondary | ICD-10-CM | POA: Diagnosis not present

## 2019-02-17 DIAGNOSIS — J441 Chronic obstructive pulmonary disease with (acute) exacerbation: Secondary | ICD-10-CM

## 2019-02-17 DIAGNOSIS — Z95828 Presence of other vascular implants and grafts: Secondary | ICD-10-CM

## 2019-02-17 DIAGNOSIS — G609 Hereditary and idiopathic neuropathy, unspecified: Secondary | ICD-10-CM

## 2019-02-17 LAB — CBC WITH DIFFERENTIAL/PLATELET
Abs Immature Granulocytes: 0.02 10*3/uL (ref 0.00–0.07)
Basophils Absolute: 0 10*3/uL (ref 0.0–0.1)
Basophils Relative: 0 %
Eosinophils Absolute: 0.1 10*3/uL (ref 0.0–0.5)
Eosinophils Relative: 2 %
HCT: 33 % — ABNORMAL LOW (ref 36.0–46.0)
Hemoglobin: 10.5 g/dL — ABNORMAL LOW (ref 12.0–15.0)
Immature Granulocytes: 0 %
Lymphocytes Relative: 13 %
Lymphs Abs: 1 10*3/uL (ref 0.7–4.0)
MCH: 29.3 pg (ref 26.0–34.0)
MCHC: 31.8 g/dL (ref 30.0–36.0)
MCV: 92.2 fL (ref 80.0–100.0)
Monocytes Absolute: 0.4 10*3/uL (ref 0.1–1.0)
Monocytes Relative: 6 %
Neutro Abs: 5.7 10*3/uL (ref 1.7–7.7)
Neutrophils Relative %: 79 %
Platelets: 213 10*3/uL (ref 150–400)
RBC: 3.58 MIL/uL — ABNORMAL LOW (ref 3.87–5.11)
RDW: 15.5 % (ref 11.5–15.5)
WBC: 7.2 10*3/uL (ref 4.0–10.5)
nRBC: 0 % (ref 0.0–0.2)

## 2019-02-17 LAB — COMPREHENSIVE METABOLIC PANEL
ALT: 19 U/L (ref 0–44)
AST: 17 U/L (ref 15–41)
Albumin: 3.6 g/dL (ref 3.5–5.0)
Alkaline Phosphatase: 101 U/L (ref 38–126)
Anion gap: 11 (ref 5–15)
BUN: 29 mg/dL — ABNORMAL HIGH (ref 6–20)
CO2: 23 mmol/L (ref 22–32)
Calcium: 9 mg/dL (ref 8.9–10.3)
Chloride: 109 mmol/L (ref 98–111)
Creatinine, Ser: 0.95 mg/dL (ref 0.44–1.00)
GFR calc Af Amer: >60 mL/min (ref >60)
GFR calc non Af Amer: >60 mL/min (ref >60)
Glucose, Bld: 112 mg/dL — ABNORMAL HIGH (ref 70–99)
Potassium: 4.1 mmol/L (ref 3.5–5.1)
Sodium: 143 mmol/L (ref 135–145)
Total Bilirubin: 0.4 mg/dL (ref 0.3–1.2)
Total Protein: 6.9 g/dL (ref 6.5–8.1)

## 2019-02-17 MED ORDER — SODIUM CHLORIDE 0.9% FLUSH
10.0000 mL | INTRAVENOUS | Status: DC | PRN
  Administered 2019-02-17: 10 mL
  Filled 2019-02-17: qty 10

## 2019-02-17 MED ORDER — DIPHENHYDRAMINE HCL 25 MG PO CAPS
ORAL_CAPSULE | ORAL | Status: AC
  Filled 2019-02-17: qty 1

## 2019-02-17 MED ORDER — ACETAMINOPHEN 325 MG PO TABS
650.0000 mg | ORAL_TABLET | Freq: Once | ORAL | Status: AC
  Administered 2019-02-17: 10:00:00 650 mg via ORAL

## 2019-02-17 MED ORDER — ACETAMINOPHEN 325 MG PO TABS
ORAL_TABLET | ORAL | Status: AC
  Filled 2019-02-17: qty 2

## 2019-02-17 MED ORDER — SODIUM CHLORIDE 0.9 % IV SOLN
Freq: Once | INTRAVENOUS | Status: AC
  Administered 2019-02-17: 10:00:00 via INTRAVENOUS
  Filled 2019-02-17: qty 250

## 2019-02-17 MED ORDER — SODIUM CHLORIDE 0.9% FLUSH
10.0000 mL | Freq: Once | INTRAVENOUS | Status: AC
  Administered 2019-02-17: 10 mL
  Filled 2019-02-17: qty 10

## 2019-02-17 MED ORDER — DIPHENHYDRAMINE HCL 25 MG PO CAPS
25.0000 mg | ORAL_CAPSULE | Freq: Once | ORAL | Status: AC
  Administered 2019-02-17: 25 mg via ORAL

## 2019-02-17 MED ORDER — GABAPENTIN 300 MG PO CAPS: 300.0000 mg | ORAL_CAPSULE | Freq: Every day | ORAL | 5 refills | Status: DC

## 2019-02-17 MED ORDER — HEPARIN SOD (PORK) LOCK FLUSH 100 UNIT/ML IV SOLN
500.0000 [IU] | Freq: Once | INTRAVENOUS | Status: AC | PRN
  Administered 2019-02-17: 500 [IU]
  Filled 2019-02-17: qty 5

## 2019-02-17 MED ORDER — TRASTUZUMAB CHEMO 150 MG IV SOLR
600.0000 mg | Freq: Once | INTRAVENOUS | Status: AC
  Administered 2019-02-17: 600 mg via INTRAVENOUS
  Filled 2019-02-17: qty 28.57

## 2019-02-17 MED FILL — GABAPENTIN 300 MG CAPSULE: 300 | 30 days supply | Qty: 30 | Fill #0

## 2019-02-17 MED FILL — tiZANidine HCL 4 MG TABS: 4 | 30 days supply | Qty: 30 | Fill #0

## 2019-02-17 NOTE — Patient Instructions (Signed)
[facility] Discharge Instructions for Patients Receiving Chemotherapy  Today you received the following chemotherapy agents Trastuzumab (HERCEPTIN).  To help prevent nausea and vomiting after your treatment, we encourage you to take your nausea medication as prescribed.  If you develop nausea and vomiting that is not controlled by your nausea medication, call the clinic.   BELOW ARE SYMPTOMS THAT SHOULD BE REPORTED IMMEDIATELY:  *FEVER GREATER THAN 100.5 F  *CHILLS WITH OR WITHOUT FEVER  NAUSEA AND VOMITING THAT IS NOT CONTROLLED WITH YOUR NAUSEA MEDICATION  *UNUSUAL SHORTNESS OF BREATH  *UNUSUAL BRUISING OR BLEEDING  TENDERNESS IN MOUTH AND THROAT WITH OR WITHOUT PRESENCE OF ULCERS  *URINARY PROBLEMS  *BOWEL PROBLEMS  UNUSUAL RASH Items with * indicate a potential emergency and should be followed up as soon as possible.  Feel free to call the clinic should you have any questions or concerns. The clinic phone number is [phone].  Please show the CHEMO ALERT CARD at check-in to the Emergency Department and triage nurse.  Coronavirus (COVID-19) Are you at risk?  Are you at risk for the Coronavirus (COVID-19)?  To be considered HIGH RISK for Coronavirus (COVID-19), you have to meet the following criteria:  . Traveled to [place], [place], [place], [place] or [place]; or in the [place] to [place], [place], [place], or [place]; and have fever, cough, and shortness of breath within the last 2 weeks of travel OR . Been in close contact with a person diagnosed with COVID-19 within the last 2 weeks and have fever, cough, and shortness of breath . IF YOU DO NOT MEET THESE CRITERIA, YOU ARE CONSIDERED LOW RISK FOR COVID-19.  What to do if you are HIGH RISK for COVID-19?  . If you are having a medical emergency, call 911. . Seek medical care right away. Before you go to a doctor's office, urgent care or emergency department, call ahead and tell them  about your recent travel, contact with someone diagnosed with COVID-19, and your symptoms. You should receive instructions from your physician's office regarding next steps of care.  . When you arrive at healthcare provider, tell the healthcare staff immediately you have returned from visiting [place], [place], [place], [place] or [place]; or traveled in the [place] to [place], [place], [place], or [place]; in the last two weeks or you have been in close contact with a person diagnosed with COVID-19 in the last 2 weeks.   . Tell the health care staff about your symptoms: fever, cough and shortness of breath. . After you have been seen by a medical provider, you will be either: o Tested for (COVID-19) and discharged home on quarantine except to seek medical care if symptoms worsen, and asked to  - Stay home and avoid contact with others until you get your results (4-5 days)  - Avoid travel on public transportation if possible (such as bus, train, or airplane) or o Sent to the Emergency Department by EMS for evaluation, COVID-19 testing, and possible admission depending on your condition and test results.  What to do if you are LOW RISK for COVID-19?  Reduce your risk of any infection by using the same precautions used for avoiding the common cold or flu:  . Wash your hands often with soap and warm water for at least 20 seconds.  If soap and water are not readily available, use an alcohol-based hand sanitizer with at least 60% alcohol.  . If coughing or sneezing,   cover your mouth and nose by coughing or sneezing into the elbow areas of your shirt or coat, into a tissue or into your sleeve (not your hands). . Avoid shaking hands with others and consider head nods or verbal greetings only. . Avoid touching your eyes, nose, or mouth with unwashed hands.  . Avoid close contact with people who are sick. . Avoid places or events with large numbers of people in one location, like concerts or  sporting events. . Carefully consider travel plans you have or are making. . If you are planning any travel outside or inside the [place], visit the CDC's Travelers' Health webpage for the latest health notices. . If you have some symptoms but not all symptoms, continue to monitor at home and seek medical attention if your symptoms worsen. . If you are having a medical emergency, call 911.   ADDITIONAL HEALTHCARE OPTIONS FOR PATIENTS  [facility] / e-Visit: [email or web address]         [facility]: [phone]  [facility]: [phone]                   [facility]: [phone]   

## 2019-02-17 NOTE — Telephone Encounter (Signed)
No los per 7/9. °

## 2019-02-18 ENCOUNTER — Ambulatory Visit (HOSPITAL_COMMUNITY)
Admission: RE | Admit: 2019-02-18 | Discharge: 2019-02-18 | Disposition: A | Discharge status: Home / Self Care | Payer: Medicaid Other | Source: Ambulatory Visit | Attending: Internal Medicine | Admitting: Internal Medicine

## 2019-02-18 ENCOUNTER — Ambulatory Visit (HOSPITAL_BASED_OUTPATIENT_CLINIC_OR_DEPARTMENT_OTHER)
Admission: RE | Admit: 2019-02-18 | Discharge: 2019-02-18 | Disposition: A | Discharge status: Home / Self Care | Payer: Medicaid Other | Source: Ambulatory Visit | Attending: Cardiology | Admitting: Cardiology

## 2019-02-18 DIAGNOSIS — Z87891 Personal history of nicotine dependence: Secondary | ICD-10-CM | POA: Insufficient documentation

## 2019-02-18 DIAGNOSIS — Z8249 Family history of ischemic heart disease and other diseases of the circulatory system: Secondary | ICD-10-CM | POA: Insufficient documentation

## 2019-02-18 DIAGNOSIS — I444 Left anterior fascicular block: Secondary | ICD-10-CM | POA: Diagnosis not present

## 2019-02-18 DIAGNOSIS — Z171 Estrogen receptor negative status [ER-]: Secondary | ICD-10-CM

## 2019-02-18 DIAGNOSIS — C50411 Malignant neoplasm of upper-outer quadrant of right female breast: Secondary | ICD-10-CM | POA: Diagnosis not present

## 2019-02-18 DIAGNOSIS — E7800 Pure hypercholesterolemia, unspecified: Secondary | ICD-10-CM

## 2019-02-18 DIAGNOSIS — C50919 Malignant neoplasm of unspecified site of unspecified female breast: Secondary | ICD-10-CM | POA: Insufficient documentation

## 2019-02-18 DIAGNOSIS — Z79899 Other long term (current) drug therapy: Secondary | ICD-10-CM | POA: Diagnosis not present

## 2019-02-18 DIAGNOSIS — Z7951 Long term (current) use of inhaled steroids: Secondary | ICD-10-CM | POA: Diagnosis not present

## 2019-02-18 DIAGNOSIS — K219 Gastro-esophageal reflux disease without esophagitis: Secondary | ICD-10-CM | POA: Diagnosis not present

## 2019-02-18 DIAGNOSIS — R9431 Abnormal electrocardiogram [ECG] [EKG]: Secondary | ICD-10-CM | POA: Diagnosis not present

## 2019-02-18 LAB — LIPID PANEL
Cholesterol: 201 mg/dL — ABNORMAL HIGH (ref 0–200)
HDL: 63 mg/dL (ref >40)
LDL Cholesterol: 115 mg/dL — ABNORMAL HIGH (ref 0–99)
Total CHOL/HDL Ratio: 3.2 RATIO
Triglycerides: 115 mg/dL (ref <150)
VLDL: 23 mg/dL (ref 0–40)

## 2019-02-18 NOTE — Patient Instructions (Signed)
Labs today We will only contact you if something comes back abnormal or we need to make some changes. Otherwise no news is good news!  Your physician has requested that you have an echocardiogram. Echocardiography is a painless test that uses sound waves to create images of your heart. It provides your doctor with information about the size and shape of your heart and how well your heart's chambers and valves are working. This procedure takes approximately one hour. There are no restrictions for this procedure.  Your physician recommends that you schedule a follow-up appointment in: 3 months with an echo. You will get a phone call to schedule this appointment.   At the [facility], you and your health needs are our priority. As part of our continuing mission to provide you with exceptional heart care, we have created designated Provider Care Teams. These Care Teams include your primary Cardiologist (physician) and Advanced Practice Providers (APPs- Physician Assistants and Nurse Practitioners) who all work together to provide you with the care you need, when you need it.   You may see any of the following providers on your designated Care Team at your next follow up: Marland Kitchen Dr Glori Bickers . Dr Loralie Champagne . Darrick Grinder, NP

## 2019-02-18 NOTE — Progress Notes (Signed)
  Echocardiogram 2D Echocardiogram has been performed.  Jacqueline Good Jacqueline Good 02/18/2019, 9:55 AM

## 2019-02-20 NOTE — Progress Notes (Signed)
Oncology: Dr. Jana Hakim  58 yo with history of breast cancer was referred by Dr. Jana Hakim for cardio-oncology evaluation.  Breast cancer was diagnosed on right in 7/29, positive axillary node. ER-/PR-/HER2+.  She had carboplatin, docetaxel, Herceptin, and pertuzumab from 8/19-12/19.  Herceptin will be continued to 9/20.  She had modified radical mastectomy on right in 1/20.  Radiation from 4/20-5/20.  She has had surveillance echoes given Herceptin use.  In 3/20, echo showed fall in EF and Herceptin was held.  Echo in 5/20 showed recovery in EF and normal strain pattern, so Herceptin was restarted.   She has no history of heart problems.  She never smoked.  Father had CAD diagnosed at advanced age and she had 2 brothers with congenital heart disease.  She is doing well symptomatically, no exertional dyspnea or chest pain.  No lightheadedness/palpitations.  No orthopnea/PND.   ECG (personally reviewed): NSR, LAFB, poor RWP  PMH: 1. GERD 2. Breast cancer: Diagnosed on right in 7/29, positive axillary node. ER-/PR-/HER2+.  She had carboplatin, docetaxel, Herceptin, and pertuzumab from 8/19-12/19.  Herceptin will be continued to 9/20.  She had modified radical mastectomy on right in 1/20.  Radiation from 4/20-5/20.   - Echo (8/19): EF 55-60%, GLS -23.6%.  - Echo (11/19): EF 55-60%, GLS -22.6% - Echo (3/20): EF 45-50% => Herceptin held - Echo (5/20): EF 55-60%, GLS -20.7% => Herceptin restarted  - Echo (7/20): EF 55-60%, GLS -18.3%.   FH: 2 brothers with congenital heart disease, sister with SLE, father with CABG at age 51.   Social History   Socioeconomic History  . Marital status: Single    Spouse name: Not on file  . Number of children: Not on file  . Years of education: Not on file  . Highest education level: Not on file  Occupational History  . Not on file  Social Needs  . Financial resource strain: Not on file  . Food insecurity    Worry: Not on file    Inability: Not on file  .  Transportation needs    Medical: No    Non-medical: No  Tobacco Use  . Smoking status: Former Smoker    Packs/day: 1.00    Years: 38.00    Pack years: 38.00    Types: Cigarettes    Quit date: 05/15/2018    Years since quitting: 0.7  . Smokeless tobacco: Never Used  Substance and Sexual Activity  . Alcohol use: Not Currently    Comment: 08/12/2018 "nothing since 04/2018"  . Drug use: No  . Sexual activity: Not Currently    Birth control/protection: None  Lifestyle  . Physical activity    Days per week: Not on file    Minutes per session: Not on file  . Stress: Not on file  Relationships  . Social Herbalist on phone: Not on file    Gets together: Not on file    Attends religious service: Not on file    Active member of club or organization: Not on file    Attends meetings of clubs or organizations: Not on file    Relationship status: Not on file  . Intimate partner violence    Fear of current or ex partner: No    Emotionally abused: No    Physically abused: No    Forced sexual activity: No  Other Topics Concern  . Not on file  Social History Narrative  . Not on file   ROS: All systems reviewed and  negative except as noted in HPI.   Current Outpatient Medications  Medication Sig Dispense Refill  . albuterol (VENTOLIN HFA) 108 (90 Base) MCG/ACT inhaler INHALE 2 PUFFS INTO THE LUNGS EVERY 6 HOURS AS NEEDED FOR WHEEZING 18 g 2  . ALPRAZolam (XANAX) 1 MG tablet 1 po q 6 hr prn.  No more than 3.5 qd. 105 tablet 0  . [START ON 03/18/2019] ALPRAZolam (XANAX) 1 MG tablet 1 mg three times and 0.5 mg per day as needed for anxiety. No more than 3.5 mg per day 105 tablet 0  . ARIPiprazole (ABILIFY) 2 MG tablet Take 1 tablet (2 mg total) by mouth daily. 90 tablet 0  . esomeprazole (NEXIUM) 40 MG capsule TAKE ONE CAPSULE BY MOUTH DAILY 30 capsule 2  . fluticasone (FLONASE) 50 MCG/ACT nasal spray Place 2 sprays into both nostrils daily as needed for up to 14 days for allergies. 1  g 1  . Fluticasone-Salmeterol (ADVAIR DISKUS) 250-50 MCG/DOSE AEPB INHALE 1 PUFF INTO THE LUNGS 2 TIMES DAILY. 60 each 2  . gabapentin (NEURONTIN) 300 MG capsule Take 1 capsule (300 mg total) by mouth at bedtime. 30 capsule 5  . lidocaine-prilocaine (EMLA) cream Apply to affected area once (Patient taking differently: Apply 1 application topically daily as needed (prior to port being flushed.). ) 30 g 3  . loratadine (CLARITIN) 10 MG tablet Take 1 tablet (10 mg total) by mouth daily as needed for allergies. 30 tablet 11  . mirtazapine (REMERON) 15 MG tablet 7.5 mg at night for one week, then 15 mg at night 30 tablet 1  . naproxen (NAPROSYN) 500 MG tablet As needed.    Marland Kitchen omeprazole (PRILOSEC) 40 MG capsule TAKE 1 CAPSULE BY MOUTH DAILY. 30 capsule 2  . propranolol (INDERAL) 40 MG tablet Take 40 mg by mouth daily as needed (heart palpitation.).     Marland Kitchen venlafaxine (EFFEXOR) 75 MG tablet Take 3 tablets (225 mg total) by mouth daily. 270 tablet 0  . zolpidem (AMBIEN) 10 MG tablet Take 1 tablet (10 mg total) by mouth at bedtime as needed for sleep (1/2-1 tab). 30 tablet 1  . diphenoxylate-atropine (LOMOTIL) 2.5-0.025 MG tablet Take 1 tablet by mouth 4 (four) times daily as needed for diarrhea or loose stools. 30 tablet 0   No current facility-administered medications for this encounter.    There were no vitals taken for this visit. General: NAD Neck: No JVD, no thyromegaly or thyroid nodule.  Lungs: Clear to auscultation bilaterally with normal respiratory effort. CV: Nondisplaced PMI.  Heart regular S1/S2, no S3/S4, no murmur.  No peripheral edema.  No carotid bruit.  Normal pedal pulses.  Abdomen: Soft, nontender, no hepatosplenomegaly, no distention.  Skin: Intact without lesions or rashes.  Neurologic: Alert and oriented x 3.  Psych: Normal affect. Extremities: No clubbing or cyanosis.  HEENT: Normal.   Assessment/Plan: 1. Breast cancer: She is getting Herceptin, planned to continue to 9/20.   She had a fall in EF noted on 3/20 echo, but echo in 5/20 and echo again today (reviewed personally) showed EF 55-60% with normal strain pattern.  She was not started on beta blocker or ACEI/ARB when her EF fell.  She is back on Herceptin (started back after 5/20 echo).  - I will have her repeat an echo in 9/20, around the time when she will finish Herceptin.  I think that she can stay off beta blocker and ACEI/ARB for now.  2. I will check her lipids given family  history of CAD.   Loralie Champagne 02/20/2019

## 2019-02-20 NOTE — Progress Notes (Signed)
The patient was seen in the infusion room as she was receiving Herceptin. She reports that she has developed numbness and tingling in the fingers and toes. She was given a prescription for gabapentin 300 mg PO QHS.  Sandi Mealy, MHS, PA-C Physician Assistant

## 2019-02-22 ENCOUNTER — Telehealth (HOSPITAL_COMMUNITY): Payer: Self-pay

## 2019-02-22 NOTE — Telephone Encounter (Signed)
Pt aware of lab work. Pt advised to work on diet. Pt did not endorse any questions and appreciated the call.

## 2019-02-22 NOTE — Telephone Encounter (Signed)
-----   Message from Larey Dresser, MD sent at 02/21/2019 12:45 AM EDT ----- Mildly elevated LDL, would work on diet for now.

## 2019-02-23 ENCOUNTER — Ambulatory Visit: Payer: Medicaid Other | Admitting: Rehabilitation

## 2019-02-25 ENCOUNTER — Other Ambulatory Visit: Payer: Self-pay

## 2019-02-25 ENCOUNTER — Ambulatory Visit: Payer: Medicaid Other | Attending: Oncology | Admitting: Physical Therapy

## 2019-02-25 ENCOUNTER — Encounter: Payer: Self-pay | Admitting: Physical Therapy

## 2019-02-25 DIAGNOSIS — M25511 Pain in right shoulder: Secondary | ICD-10-CM | POA: Insufficient documentation

## 2019-02-25 DIAGNOSIS — G8929 Other chronic pain: Secondary | ICD-10-CM

## 2019-02-25 DIAGNOSIS — I972 Postmastectomy lymphedema syndrome: Secondary | ICD-10-CM | POA: Diagnosis present

## 2019-02-25 DIAGNOSIS — C50411 Malignant neoplasm of upper-outer quadrant of right female breast: Secondary | ICD-10-CM | POA: Insufficient documentation

## 2019-02-25 DIAGNOSIS — Z171 Estrogen receptor negative status [ER-]: Secondary | ICD-10-CM | POA: Diagnosis present

## 2019-02-25 DIAGNOSIS — M25611 Stiffness of right shoulder, not elsewhere classified: Secondary | ICD-10-CM | POA: Diagnosis present

## 2019-02-25 DIAGNOSIS — R293 Abnormal posture: Secondary | ICD-10-CM | POA: Diagnosis present

## 2019-02-25 DIAGNOSIS — L599 Disorder of the skin and subcutaneous tissue related to radiation, unspecified: Secondary | ICD-10-CM | POA: Diagnosis present

## 2019-02-25 NOTE — Therapy (Signed)
[place], [place], [address] Phone: [phone]   Fax:  [phone]  Physical Therapy Treatment  Patient Details  Name: Jacqueline Good MRN: [id number] Date of Birth: [birth year] Referring Provider (PT): [name]   Encounter Date: 02/25/2019  PT End of Session - 02/25/19 0918    Visit Number  5    Number of Visits  16    Date for PT Re-Evaluation  03/22/19    Authorization Type  Medicaid- orginally approved for 3 visits, just got approval for 12 more visits from 7/1 to 8/11    Authorization - Visit Number  1    Authorization - Number of Visits  12    PT Start Time  [phone]    PT Stop Time  0916    PT Time Calculation (min)  44 min    Activity Tolerance  Patient tolerated treatment well    Behavior During Therapy  [facility] for tasks assessed/performed       Past Medical History:  Diagnosis Date  . Anemia   . Anxiety   . Arthritis    "all over" (08/12/2018)  . Asthma   . Breast cancer, right breast ([place]) dx'd 02/2018  . Chronic bronchitis ([place])   . Depression   . Family history of breast cancer   . Family history of prostate cancer   . Family history of uterine cancer   . GERD (gastroesophageal reflux disease)   . History of blood transfusion    "several; related to low blood" (08/12/2018)  . History of radiation therapy 11/15/18- 12/17/18   Right Chest wall, SCV, PAB. 25 fractions.     Past Surgical History:  Procedure Laterality Date  . AXILLARY LYMPH NODE DISSECTION Right 08/12/2018   Procedure: AXILLARY LYMPH NODE DISSECTION;  Surgeon: Alphonsa Overall, MD;  Location: [facility];  Service: General;  Laterality: Right;  . BREAST BIOPSY Right 03/2018  . BREAST RECONSTRUCTION WITH PLACEMENT OF TISSUE EXPANDER AND FLEX HD (ACELLULAR HYDRATED DERMIS) Right 08/12/2018   Procedure: RIGHT BREAST RECONSTRUCTION WITH PLACEMENT OF TISSUE EXPANDER AND FLEX HD (ACELLULAR HYDRATED DERMIS);  Surgeon: Wallace Going, DO;   Location: [facility];  Service: Plastics;  Laterality: Right;  . DILATION AND CURETTAGE OF UTERUS    . ENDOMETRIAL ABLATION    . IR IMAGING GUIDED PORT INSERTION  03/18/2018  . MASTECTOMY MODIFIED RADICAL Right 08/12/2018   w/axillary LND  . MASTECTOMY MODIFIED RADICAL Right 08/12/2018   Procedure: RIGHT MODIFIED RADICAL MASTECTOMY;  Surgeon: Alphonsa Overall, MD;  Location: [facility];  Service: General;  Laterality: Right;  . MYOMECTOMY    . REMOVAL OF TISSUE EXPANDER AND PLACEMENT OF IMPLANT Right 10/26/2018   Procedure: Removal of right breast expander;  Surgeon: Wallace Going, DO;  Location: [facility];  Service: Plastics;  Laterality: Right;  75 min, please    There were no vitals filed for this visit.  Subjective Assessment - 02/25/19 0833    Subjective  My chest is getting better. I only have spasms about 2x/wk. I think I have some more swelling.    Pertinent History  Patient was diagnosed on 02/04/18 with right invasive ductal carcinoma breast cancer. It measures 3.1 cm and is located in the upper outer quadrant. It is ER/PR negative and HER2 positive with a Ki67 of 50%. She has a known positive axillary lymph node. She has no other medical problems but does smoke 1 pack per day., 08/12/18- right mastectomy with ALND, 10/26/18- pt  had expander removed because of infection, she has completed chemo and radiation    Patient Stated Goals  to get rid of these spasms    Currently in Pain?  No/denies    Pain Score  0-No pain                       [facility] Adult PT Treatment/Exercise - 02/25/19 0001      Manual Therapy   Manual Therapy  Manual Lymphatic Drainage (MLD);Soft tissue mobilization;Passive ROM    Soft tissue mobilization  to Rt pectoralis muscle especially at axilla while performing PROM    Manual Lymphatic Drainage (MLD)  in supine: short neck, 5 diaphragmatic breaths, left axillary nodes and establishment of interaxillary pathway, right inguinal nodes and establishment of axillo  inguinal pathway then R chest and lateral trunk moving fluid towards pathways    Passive ROM  to Rt shoulder as tolerated in direction of flexion, abduction, and ER                  PT Long Term Goals - 02/25/19 0834      PT LONG TERM GOAL #1   Title  Pt will report a 75% improvement in right chest pain to decrease discomfort caused by spasms.    Baseline  65% improvement reported at this time-02/01/19, 02/25/19- 75% improved    Time  4    Period  Weeks    Status  Achieved      PT LONG TERM GOAL #2   Title  Pt will be independent in a home exercise program for continued strengthening and stretching    Baseline  Pt is independent with initial HEP for AA/ROM, is notyet ready for strength due to tightness/spasms-02/01/19    Time  4    Period  Weeks    Status  On-going      PT LONG TERM GOAL #3   Title  Pt will report a 25% improvement in swelling in right chest to allow improved comfort.    Baseline  30% improvement reported at this time-02/01/19, 02/25/19- pt reports she is now having more swelling, it has worsened    Time  4    Period  Weeks    Status  On-going      PT LONG TERM GOAL #4   Title  Pt will demonstrate 150 degrees of right shoulder flexion to allow her to reach overhead.    Baseline  116; 126 degrees- 02/01/19, 02/25/19- 127 degrees    Time  4    Period  Weeks    Status  On-going      PT LONG TERM GOAL #5   Title  Pt will demonstrate 140 degrees of right shoulder abduction to allow her to reach out to the side.    Baseline  89; 106 degrees - 02/01/19, 02/25/19- 130    Time  4    Period  Weeks    Status  On-going            Plan - 02/25/19 0919    Clinical Impression Statement  Assessed pt's progress towards goals in therapy. She is progressing towards her goals. She has made signficant progress towards her abduction goal but is still limited. She still has increased tightness across her right pec. She has developed more swelling surrounding her right  mastectomy scar so added chip pack to compression bra to help with this and educated pt on importance of wearing her compression bra  consistently. Also educated pt on importance of consistently doing her exercises. Pt would benefit from additional skilled PT visits to continue to progress towards goals in therapy to improve ROM, improve comfort and decrease swelling.    Comorbidities  depression, anxiety, lives alone    Examination-Activity Limitations  Reach Overhead;Carry    Examination-Participation Restrictions  Cleaning    Stability/Clinical Decision Making  Evolving/Moderate complexity    Rehab Potential  Good    Clinical Impairments Affecting Rehab Potential  hx of radiation, hx of infections in right chest    PT Frequency  2x / week    PT Duration  4 weeks    PT Treatment/Interventions  ADLs/Self Care Home Management;Therapeutic exercise;Patient/family education;Orthotic Fit/Training;Manual techniques;Manual lymph drainage;Scar mobilization;Passive range of motion;Taping;Joint Manipulations    PT Next Visit Plan  MLD to R chest and issue instructions, Cont gentle P/ROM of Rt shoulder, and STM to Rt chest and scar massage; see if pt able to tolerate supine scapular series next session.    Consulted and Agree with Plan of Care  Patient       Patient will benefit from skilled therapeutic intervention in order to improve the following deficits and impairments:  Decreased knowledge of precautions, Impaired UE functional use, Decreased range of motion, Postural dysfunction, Pain, Decreased scar mobility, Increased fascial restricitons, Decreased strength, Increased edema  Visit Diagnosis: 1. Stiffness of right shoulder, not elsewhere classified   2. Postmastectomy lymphedema   3. Chronic right shoulder pain   4. Disorder of the skin and subcutaneous tissue related to radiation, unspecified   5. Abnormal posture        Problem List Patient Active Problem List   Diagnosis Date Noted  .  Genetic testing 10/01/2018  . Family history of breast cancer   . Family history of uterine cancer   . Family history of prostate cancer   . S/P mastectomy, right 08/20/2018  . Acquired absence of right breast 08/20/2018  . Port-A-Cath in place 06/21/2018  . MDD (major depressive disorder), recurrent episode, moderate ([place]) 06/13/2018  . Generalized anxiety disorder 06/13/2018  . OCD (obsessive compulsive disorder) 06/13/2018  . Insomnia 06/13/2018  . Malignant neoplasm of upper-outer quadrant of right breast in female, estrogen receptor negative ([place]) 03/02/2018  . Needs flu shot 06/03/2017  . Chronic bilateral low back pain without sciatica 06/03/2017  . Adjustment disorder with anxious mood 03/27/2016  . Benign paroxysmal positional vertigo 10/31/2014  . Arthritis 04/27/2014  . Fatigue 06/10/2013  . Morbid obesity ([place]) 06/10/2013  . Asthma, chronic obstructive, with acute exacerbation ([place]) 06/10/2013  . Allergy 06/10/2013    Allyson Sabal [facility] 02/25/2019, 9:27 AM  [place], [place], [address] Phone: [phone]   Fax:  [phone]  Name: Jacqueline Good MRN: [id number] Date of Birth: 1961-07-26  Manus Gunning, PT 02/25/19 9:27 AM

## 2019-02-28 ENCOUNTER — Encounter: Payer: Self-pay | Admitting: Rehabilitation

## 2019-02-28 ENCOUNTER — Other Ambulatory Visit: Payer: Self-pay

## 2019-02-28 ENCOUNTER — Ambulatory Visit: Payer: Medicaid Other | Admitting: Rehabilitation

## 2019-02-28 DIAGNOSIS — M25611 Stiffness of right shoulder, not elsewhere classified: Secondary | ICD-10-CM

## 2019-02-28 DIAGNOSIS — Z171 Estrogen receptor negative status [ER-]: Secondary | ICD-10-CM

## 2019-02-28 DIAGNOSIS — M25511 Pain in right shoulder: Secondary | ICD-10-CM

## 2019-02-28 DIAGNOSIS — R293 Abnormal posture: Secondary | ICD-10-CM

## 2019-02-28 DIAGNOSIS — G8929 Other chronic pain: Secondary | ICD-10-CM

## 2019-02-28 DIAGNOSIS — C50411 Malignant neoplasm of upper-outer quadrant of right female breast: Secondary | ICD-10-CM

## 2019-02-28 DIAGNOSIS — I972 Postmastectomy lymphedema syndrome: Secondary | ICD-10-CM

## 2019-02-28 DIAGNOSIS — L599 Disorder of the skin and subcutaneous tissue related to radiation, unspecified: Secondary | ICD-10-CM

## 2019-02-28 NOTE — Therapy (Signed)
[place], [place], [address] Phone: [phone]   Fax:  [phone]  Physical Therapy Treatment  Patient Details  Name: Jacqueline Good MRN: [id number] Date of Birth: Dec 18, 1960 Referring Provider (PT): [name]   Encounter Date: 02/28/2019  PT End of Session - 02/28/19 1605    Visit Number  6    Number of Visits  16    Date for PT Re-Evaluation  03/22/19    Authorization Type  Medicaid- orginally approved for 3 visits, just got approval for 12 more visits from 7/1 to 8/11    Authorization - Visit Number  2    Authorization - Number of Visits  12    PT Start Time  1856    PT Stop Time  1600    PT Time Calculation (min)  45 min    Activity Tolerance  Patient tolerated treatment well    Behavior During Therapy  [facility] for tasks assessed/performed       Past Medical History:  Diagnosis Date  . Anemia   . Anxiety   . Arthritis    "all over" (08/12/2018)  . Asthma   . Breast cancer, right breast ([place]) dx'd 02/2018  . Chronic bronchitis ([place])   . Depression   . Family history of breast cancer   . Family history of prostate cancer   . Family history of uterine cancer   . GERD (gastroesophageal reflux disease)   . History of blood transfusion    "several; related to low blood" (08/12/2018)  . History of radiation therapy 11/15/18- 12/17/18   Right Chest wall, SCV, PAB. 25 fractions.     Past Surgical History:  Procedure Laterality Date  . AXILLARY LYMPH NODE DISSECTION Right 08/12/2018   Procedure: AXILLARY LYMPH NODE DISSECTION;  Surgeon: Alphonsa Overall, MD;  Location: [place];  Service: General;  Laterality: Right;  . BREAST BIOPSY Right 03/2018  . BREAST RECONSTRUCTION WITH PLACEMENT OF TISSUE EXPANDER AND FLEX HD (ACELLULAR HYDRATED DERMIS) Right 08/12/2018   Procedure: RIGHT BREAST RECONSTRUCTION WITH PLACEMENT OF TISSUE EXPANDER AND FLEX HD (ACELLULAR HYDRATED DERMIS);  Surgeon: Wallace Going, DO;   Location: [place];  Service: Plastics;  Laterality: Right;  . DILATION AND CURETTAGE OF UTERUS    . ENDOMETRIAL ABLATION    . IR IMAGING GUIDED PORT INSERTION  03/18/2018  . MASTECTOMY MODIFIED RADICAL Right 08/12/2018   w/axillary LND  . MASTECTOMY MODIFIED RADICAL Right 08/12/2018   Procedure: RIGHT MODIFIED RADICAL MASTECTOMY;  Surgeon: Alphonsa Overall, MD;  Location: [place];  Service: General;  Laterality: Right;  . MYOMECTOMY    . REMOVAL OF TISSUE EXPANDER AND PLACEMENT OF IMPLANT Right 10/26/2018   Procedure: Removal of right breast expander;  Surgeon: Wallace Going, DO;  Location: [place];  Service: Plastics;  Laterality: Right;  75 min, please    There were no vitals filed for this visit.  Subjective Assessment - 02/28/19 1513    Subjective  yesterday had some spams but none today so far.    Pertinent History  Patient was diagnosed on 02/04/18 with right invasive ductal carcinoma breast cancer. It measures 3.1 cm and is located in the upper outer quadrant. It is ER/PR negative and HER2 positive with a Ki67 of 50%. She has a known positive axillary lymph node. She has no other medical problems but does smoke 1 pack per day., 08/12/18- right mastectomy with ALND, 10/26/18- pt had expander removed because of infection, she has completed  chemo and radiation    Currently in Pain?  No/denies                       [facility] Adult PT Treatment/Exercise - 02/28/19 0001      Shoulder Exercises: Stretch   Other Shoulder Stretches  behind the head stretch with pillow support 2x30"    Other Shoulder Stretches  out to the side horizontal abduction x 60" with pillow support       Manual Therapy   Soft tissue mobilization  to Rt pectoralis body origin and insertion and some gentle work attempted at the scar but with pt still apprehensive about touching the incision.  Reminded her that the faster she starts working on it and touching it the faster it will feel normal and soften up.      Manual Lymphatic Drainage (MLD)  in supine: short neck, 5 diaphragmatic breaths, left axillary nodes and establishment of interaxillary pathway, right inguinal nodes and establishment of axillo inguinal pathway then R chest and lateral trunk moving fluid towards pathways    Passive ROM  to Rt shoulder as tolerated in direction of flexion, abduction, and ER and D2                  PT Long Term Goals - 02/25/19 0834      PT LONG TERM GOAL #1   Title  Pt will report a 75% improvement in right chest pain to decrease discomfort caused by spasms.    Baseline  65% improvement reported at this time-02/01/19, 02/25/19- 75% improved    Time  4    Period  Weeks    Status  Achieved      PT LONG TERM GOAL #2   Title  Pt will be independent in a home exercise program for continued strengthening and stretching    Baseline  Pt is independent with initial HEP for AA/ROM, is notyet ready for strength due to tightness/spasms-02/01/19    Time  4    Period  Weeks    Status  On-going      PT LONG TERM GOAL #3   Title  Pt will report a 25% improvement in swelling in right chest to allow improved comfort.    Baseline  30% improvement reported at this time-02/01/19, 02/25/19- pt reports she is now having more swelling, it has worsened    Time  4    Period  Weeks    Status  On-going      PT LONG TERM GOAL #4   Title  Pt will demonstrate 150 degrees of right shoulder flexion to allow her to reach overhead.    Baseline  116; 126 degrees- 02/01/19, 02/25/19- 127 degrees    Time  4    Period  Weeks    Status  On-going      PT LONG TERM GOAL #5   Title  Pt will demonstrate 140 degrees of right shoulder abduction to allow her to reach out to the side.    Baseline  89; 106 degrees - 02/01/19, 02/25/19- 130    Time  4    Period  Weeks    Status  On-going            Plan - 02/28/19 1605    Clinical Impression Statement  Continued with Rt chest STM and stretching with moderate tightness and fear of  movement also still with some apprehension regarding looking at and touching the incision. Verbally added  behind the head and horizontal abduction pillow propped stretches for home.  Pt reports she is feeling better but is really not doing any stretches.    PT Frequency  2x / week    PT Duration  4 weeks    PT Treatment/Interventions  ADLs/Self Care Home Management;Therapeutic exercise;Patient/family education;Orthotic Fit/Training;Manual techniques;Manual lymph drainage;Scar mobilization;Passive range of motion;Taping;Joint Manipulations    PT Next Visit Plan  MLD to R chest and issue instructions, Cont gentle P/ROM of Rt shoulder, and STM to Rt chest and scar massage; see if pt able to tolerate supine scapular series soon    PT Home Exercise Plan  supine dowel ex, behind the head stretch, horizontal abduction stretch    Consulted and Agree with Plan of Care  Patient       Patient will benefit from skilled therapeutic intervention in order to improve the following deficits and impairments:     Visit Diagnosis: 1. Stiffness of right shoulder, not elsewhere classified   2. Postmastectomy lymphedema   3. Chronic right shoulder pain   4. Disorder of the skin and subcutaneous tissue related to radiation, unspecified   5. Abnormal posture   6. Malignant neoplasm of upper-outer quadrant of right breast in female, estrogen receptor negative [facility])        Problem List Patient Active Problem List   Diagnosis Date Noted  . Genetic testing 10/01/2018  . Family history of breast cancer   . Family history of uterine cancer   . Family history of prostate cancer   . S/P mastectomy, right 08/20/2018  . Acquired absence of right breast 08/20/2018  . Port-A-Cath in place 06/21/2018  . MDD (major depressive disorder), recurrent episode, moderate ([place]) 06/13/2018  . Generalized anxiety disorder 06/13/2018  . OCD (obsessive compulsive disorder) 06/13/2018  . Insomnia 06/13/2018  . Malignant neoplasm  of upper-outer quadrant of right breast in female, estrogen receptor negative ([place]) 03/02/2018  . Needs flu shot 06/03/2017  . Chronic bilateral low back pain without sciatica 06/03/2017  . Adjustment disorder with anxious mood 03/27/2016  . Benign paroxysmal positional vertigo 10/31/2014  . Arthritis 04/27/2014  . Fatigue 06/10/2013  . Morbid obesity ([place]) 06/10/2013  . Asthma, chronic obstructive, with acute exacerbation ([place]) 06/10/2013  . Allergy 06/10/2013    Stark Bray 02/28/2019, 4:09 PM  [place], [place], [address] Phone: [phone]   Fax:  [phone]  Name: VANIAH CHAMBERS MRN: [id number] Date of Birth: 1960-12-09

## 2019-03-02 ENCOUNTER — Ambulatory Visit: Payer: Medicaid Other

## 2019-03-02 ENCOUNTER — Other Ambulatory Visit: Payer: Self-pay

## 2019-03-02 DIAGNOSIS — M25611 Stiffness of right shoulder, not elsewhere classified: Secondary | ICD-10-CM | POA: Diagnosis not present

## 2019-03-02 DIAGNOSIS — G8929 Other chronic pain: Secondary | ICD-10-CM

## 2019-03-02 DIAGNOSIS — Z171 Estrogen receptor negative status [ER-]: Secondary | ICD-10-CM

## 2019-03-02 DIAGNOSIS — I972 Postmastectomy lymphedema syndrome: Secondary | ICD-10-CM

## 2019-03-02 DIAGNOSIS — R293 Abnormal posture: Secondary | ICD-10-CM

## 2019-03-02 DIAGNOSIS — M25511 Pain in right shoulder: Secondary | ICD-10-CM

## 2019-03-02 DIAGNOSIS — L599 Disorder of the skin and subcutaneous tissue related to radiation, unspecified: Secondary | ICD-10-CM

## 2019-03-02 DIAGNOSIS — C50411 Malignant neoplasm of upper-outer quadrant of right female breast: Secondary | ICD-10-CM

## 2019-03-02 NOTE — Patient Instructions (Signed)

## 2019-03-02 NOTE — Therapy (Signed)
[place], [place], [address] Phone: [phone]   Fax:  [phone]  Physical Therapy Treatment  Patient Details  Name: Jacqueline Good MRN: [id number] Date of Birth: 09/09/1960 Referring Provider (PT): [name]   Encounter Date: 03/02/2019  PT End of Session - 03/02/19 1322    Visit Number  7    Number of Visits  16    Date for PT Re-Evaluation  03/22/19    Authorization Type  Medicaid- orginally approved for 3 visits, just got approval for 12 more visits from 7/1 to 8/11    Authorization - Visit Number  3    Authorization - Number of Visits  12    PT Start Time  4665    PT Stop Time  1320    PT Time Calculation (min)  45 min    Activity Tolerance  Patient tolerated treatment well    Behavior During Therapy  [facility] for tasks assessed/performed       Past Medical History:  Diagnosis Date  . Anemia   . Anxiety   . Arthritis    "all over" (08/12/2018)  . Asthma   . Breast cancer, right breast ([place]) dx'd 02/2018  . Chronic bronchitis ([place])   . Depression   . Family history of breast cancer   . Family history of prostate cancer   . Family history of uterine cancer   . GERD (gastroesophageal reflux disease)   . History of blood transfusion    "several; related to low blood" (08/12/2018)  . History of radiation therapy 11/15/18- 12/17/18   Right Chest wall, SCV, PAB. 25 fractions.     Past Surgical History:  Procedure Laterality Date  . AXILLARY LYMPH NODE DISSECTION Right 08/12/2018   Procedure: AXILLARY LYMPH NODE DISSECTION;  Surgeon: Alphonsa Overall, MD;  Location: [place];  Service: General;  Laterality: Right;  . BREAST BIOPSY Right 03/2018  . BREAST RECONSTRUCTION WITH PLACEMENT OF TISSUE EXPANDER AND FLEX HD (ACELLULAR HYDRATED DERMIS) Right 08/12/2018   Procedure: RIGHT BREAST RECONSTRUCTION WITH PLACEMENT OF TISSUE EXPANDER AND FLEX HD (ACELLULAR HYDRATED DERMIS);  Surgeon: Wallace Going, DO;   Location: [place];  Service: Plastics;  Laterality: Right;  . DILATION AND CURETTAGE OF UTERUS    . ENDOMETRIAL ABLATION    . IR IMAGING GUIDED PORT INSERTION  03/18/2018  . MASTECTOMY MODIFIED RADICAL Right 08/12/2018   w/axillary LND  . MASTECTOMY MODIFIED RADICAL Right 08/12/2018   Procedure: RIGHT MODIFIED RADICAL MASTECTOMY;  Surgeon: Alphonsa Overall, MD;  Location: [place];  Service: General;  Laterality: Right;  . MYOMECTOMY    . REMOVAL OF TISSUE EXPANDER AND PLACEMENT OF IMPLANT Right 10/26/2018   Procedure: Removal of right breast expander;  Surgeon: Wallace Going, DO;  Location: [place];  Service: Plastics;  Laterality: Right;  75 min, please    There were no vitals filed for this visit.  Subjective Assessment - 03/02/19 1240    Subjective  The spasms continue to improve.    Pertinent History  Patient was diagnosed on 02/04/18 with right invasive ductal carcinoma breast cancer. It measures 3.1 cm and is located in the upper outer quadrant. It is ER/PR negative and HER2 positive with a Ki67 of 50%. She has a known positive axillary lymph node. She has no other medical problems but does smoke 1 pack per day., 08/12/18- right mastectomy with ALND, 10/26/18- pt had expander removed because of infection, she has completed chemo and radiation  Patient Stated Goals  to get rid of these spasms    Currently in Pain?  No/denies                       [facility] Adult PT Treatment/Exercise - 03/02/19 0001      Shoulder Exercises: Supine   Horizontal ABduction  Strengthening;Both;5 reps;Theraband    Theraband Level (Shoulder Horizontal ABduction)  Level 1 (Yellow)    External Rotation  Strengthening;Both;5 reps;Theraband    Theraband Level (Shoulder External Rotation)  Level 1 (Yellow)    Flexion  Strengthening;Both;5 reps;Theraband   Narrow and Wide grip, 5 times each   Theraband Level (Shoulder Flexion)  Level 1 (Yellow)    Diagonals  Strengthening;Right;Left;5 reps;Theraband     Theraband Level (Shoulder Diagonals)  Level 1 (Yellow)    Diagonals Limitations  Pt returned demo for all above exercises with encouragement to use available ROM but not to push into pain. Pt very slow with all motions and tense despite VCs to relax, she reports still struggles with relaxing with motion at times.      Manual Therapy   Soft tissue mobilization  to Rt pectoralis body origin and insertion and some gentle work attempted at the scar but with pt still apprehensive about touching the incision.  Reminded her that the faster she starts working on it and touching it the faster it will feel normal and soften up.     Manual Lymphatic Drainage (MLD)  in supine: short neck, 5 diaphragmatic breaths, left axillary nodes and establishment of interaxillary pathway, right inguinal nodes and establishment of axillo inguinal pathway then R chest and lateral trunk moving fluid towards pathways    Passive ROM  to Rt shoulder as tolerated in direction of flexion, abduction, and ER and D2             PT Education - 03/02/19 1241    Education Details  Supine scapular series with yellow theraband    Person(s) Educated  Patient    Methods  Explanation;Demonstration;Handout    Comprehension  Verbalized understanding;Returned demonstration;Need further instruction          PT Long Term Goals - 02/25/19 0834      PT LONG TERM GOAL #1   Title  Pt will report a 75% improvement in right chest pain to decrease discomfort caused by spasms.    Baseline  65% improvement reported at this time-02/01/19, 02/25/19- 75% improved    Time  4    Period  Weeks    Status  Achieved      PT LONG TERM GOAL #2   Title  Pt will be independent in a home exercise program for continued strengthening and stretching    Baseline  Pt is independent with initial HEP for AA/ROM, is notyet ready for strength due to tightness/spasms-02/01/19    Time  4    Period  Weeks    Status  On-going      PT LONG TERM GOAL #3   Title   Pt will report a 25% improvement in swelling in right chest to allow improved comfort.    Baseline  30% improvement reported at this time-02/01/19, 02/25/19- pt reports she is now having more swelling, it has worsened    Time  4    Period  Weeks    Status  On-going      PT LONG TERM GOAL #4   Title  Pt will demonstrate 150 degrees of right shoulder flexion  to allow her to reach overhead.    Baseline  116; 126 degrees- 02/01/19, 02/25/19- 127 degrees    Time  4    Period  Weeks    Status  On-going      PT LONG TERM GOAL #5   Title  Pt will demonstrate 140 degrees of right shoulder abduction to allow her to reach out to the side.    Baseline  89; 106 degrees - 02/01/19, 02/25/19- 130    Time  4    Period  Weeks    Status  On-going            Plan - 03/02/19 1323    Clinical Impression Statement  Progressed pt to include scapular stability exercises with supine scapular stability with yellow theraband. Pt tolerated this well though was limited at end ROM's due to nervous, but she reported though challenged by exs, they did not cause pain or spasms.    Personal Factors and Comorbidities  Comorbidity 1;Comorbidity 2;Other    Comorbidities  depression, anxiety, lives alone    Examination-Activity Limitations  Reach Overhead;Carry    Examination-Participation Restrictions  Cleaning    Stability/Clinical Decision Making  Evolving/Moderate complexity    Rehab Potential  Good    Clinical Impairments Affecting Rehab Potential  hx of radiation, hx of infections in right chest    PT Frequency  2x / week    PT Duration  4 weeks    PT Treatment/Interventions  ADLs/Self Care Home Management;Therapeutic exercise;Patient/family education;Orthotic Fit/Training;Manual techniques;Manual lymph drainage;Scar mobilization;Passive range of motion;Taping;Joint Manipulations    PT Next Visit Plan  MLD to Rt chest and issue instructions, Cont gentle P/ROM of Rt shoulder, and STM to Rt chest and scar massage;  review supine scapular series and see how pt is tolerating this    PT Home Exercise Plan  supine dowel ex, behind the head stretch, horizontal abduction stretch; supine scapular series    Consulted and Agree with Plan of Care  Patient       Patient will benefit from skilled therapeutic intervention in order to improve the following deficits and impairments:  Decreased knowledge of precautions, Impaired UE functional use, Decreased range of motion, Postural dysfunction, Pain, Decreased scar mobility, Increased fascial restricitons, Decreased strength, Increased edema  Visit Diagnosis: 1. Stiffness of right shoulder, not elsewhere classified   2. Postmastectomy lymphedema   3. Chronic right shoulder pain   4. Disorder of the skin and subcutaneous tissue related to radiation, unspecified   5. Abnormal posture   6. Malignant neoplasm of upper-outer quadrant of right breast in female, estrogen receptor negative [facility])        Problem List Patient Active Problem List   Diagnosis Date Noted  . Genetic testing 10/01/2018  . Family history of breast cancer   . Family history of uterine cancer   . Family history of prostate cancer   . S/P mastectomy, right 08/20/2018  . Acquired absence of right breast 08/20/2018  . Port-A-Cath in place 06/21/2018  . MDD (major depressive disorder), recurrent episode, moderate ([place]) 06/13/2018  . Generalized anxiety disorder 06/13/2018  . OCD (obsessive compulsive disorder) 06/13/2018  . Insomnia 06/13/2018  . Malignant neoplasm of upper-outer quadrant of right breast in female, estrogen receptor negative ([place]) 03/02/2018  . Needs flu shot 06/03/2017  . Chronic bilateral low back pain without sciatica 06/03/2017  . Adjustment disorder with anxious mood 03/27/2016  . Benign paroxysmal positional vertigo 10/31/2014  . Arthritis 04/27/2014  . Fatigue 06/10/2013  .  Morbid obesity ([place]) 06/10/2013  . Asthma, chronic obstructive, with acute exacerbation ([place])  06/10/2013  . Allergy 06/10/2013    Otelia Limes, PTA 03/02/2019, 1:27 PM  [place], [place], [address] Phone: [phone]   Fax:  [phone]  Name: LUBERTHA LEITE MRN: [id number] Date of Birth: [birth year]

## 2019-03-07 ENCOUNTER — Telehealth: Payer: Self-pay

## 2019-03-07 ENCOUNTER — Ambulatory Visit: Payer: Medicaid Other

## 2019-03-07 NOTE — Telephone Encounter (Signed)
Spoke with pts sister [name] and she informed me that Jacqueline Good is in the process of having to repair her kitchen floor due to damage and had gone to [employer] with contractor. So just relayed message that we will see her at her next scheduled visit.

## 2019-03-09 ENCOUNTER — Other Ambulatory Visit: Payer: Self-pay

## 2019-03-09 ENCOUNTER — Ambulatory Visit: Payer: Medicaid Other

## 2019-03-09 DIAGNOSIS — Z171 Estrogen receptor negative status [ER-]: Secondary | ICD-10-CM

## 2019-03-09 DIAGNOSIS — R293 Abnormal posture: Secondary | ICD-10-CM

## 2019-03-09 DIAGNOSIS — M25611 Stiffness of right shoulder, not elsewhere classified: Secondary | ICD-10-CM | POA: Diagnosis not present

## 2019-03-09 DIAGNOSIS — I972 Postmastectomy lymphedema syndrome: Secondary | ICD-10-CM

## 2019-03-09 DIAGNOSIS — G8929 Other chronic pain: Secondary | ICD-10-CM

## 2019-03-09 DIAGNOSIS — L599 Disorder of the skin and subcutaneous tissue related to radiation, unspecified: Secondary | ICD-10-CM

## 2019-03-09 DIAGNOSIS — C50411 Malignant neoplasm of upper-outer quadrant of right female breast: Secondary | ICD-10-CM

## 2019-03-09 NOTE — Therapy (Signed)
[place], [place], [address] Phone: [phone]   Fax:  [phone]  Physical Therapy Treatment  Patient Details  Name: Jacqueline Good MRN: [id number] Date of Birth: 12/06/1960 Referring Provider (PT): [name]   Encounter Date: 03/09/2019  PT End of Session - 03/09/19 1035    Visit Number  8    Number of Visits  16    Date for PT Re-Evaluation  03/22/19    Authorization Type  Medicaid- orginally approved for 3 visits, just got approval for 12 more visits from 7/1 to 8/11    Authorization - Visit Number  4    Authorization - Number of Visits  12    PT Start Time  1006    PT Stop Time  3888   pt needed to leave early today   PT Time Calculation (min)  29 min    Activity Tolerance  Patient tolerated treatment well    Behavior During Therapy  [facility] for tasks assessed/performed       Past Medical History:  Diagnosis Date  . Anemia   . Anxiety   . Arthritis    "all over" (08/12/2018)  . Asthma   . Breast cancer, right breast ([place]) dx'd 02/2018  . Chronic bronchitis ([place])   . Depression   . Family history of breast cancer   . Family history of prostate cancer   . Family history of uterine cancer   . GERD (gastroesophageal reflux disease)   . History of blood transfusion    "several; related to low blood" (08/12/2018)  . History of radiation therapy 11/15/18- 12/17/18   Right Chest wall, SCV, PAB. 25 fractions.     Past Surgical History:  Procedure Laterality Date  . AXILLARY LYMPH NODE DISSECTION Right 08/12/2018   Procedure: AXILLARY LYMPH NODE DISSECTION;  Surgeon: Alphonsa Overall, MD;  Location: [place];  Service: General;  Laterality: Right;  . BREAST BIOPSY Right 03/2018  . BREAST RECONSTRUCTION WITH PLACEMENT OF TISSUE EXPANDER AND FLEX HD (ACELLULAR HYDRATED DERMIS) Right 08/12/2018   Procedure: RIGHT BREAST RECONSTRUCTION WITH PLACEMENT OF TISSUE EXPANDER AND FLEX HD (ACELLULAR HYDRATED DERMIS);   Surgeon: Wallace Going, DO;  Location: [place];  Service: Plastics;  Laterality: Right;  . DILATION AND CURETTAGE OF UTERUS    . ENDOMETRIAL ABLATION    . IR IMAGING GUIDED PORT INSERTION  03/18/2018  . MASTECTOMY MODIFIED RADICAL Right 08/12/2018   w/axillary LND  . MASTECTOMY MODIFIED RADICAL Right 08/12/2018   Procedure: RIGHT MODIFIED RADICAL MASTECTOMY;  Surgeon: Alphonsa Overall, MD;  Location: [place];  Service: General;  Laterality: Right;  . MYOMECTOMY    . REMOVAL OF TISSUE EXPANDER AND PLACEMENT OF IMPLANT Right 10/26/2018   Procedure: Removal of right breast expander;  Surgeon: Wallace Going, DO;  Location: [place];  Service: Plastics;  Laterality: Right;  75 min, please    There were no vitals filed for this visit.  Subjective Assessment - 03/09/19 1007    Subjective  I'm sorry I missed my last visit! My floors have been bad for awhile and I can finally get them fixed and I got busy with the contractor and totally forgot! And I need to leave early today as well to go meet him again. But my Rt shoulder is just doing better. I'm noticing more ROM. And the new exercises you gave me and going well also. Also I haven't had a spasm for a few days!  Pertinent History  Patient was diagnosed on 02/04/18 with right invasive ductal carcinoma breast cancer. It measures 3.1 cm and is located in the upper outer quadrant. It is ER/PR negative and HER2 positive with a Ki67 of 50%. She has a known positive axillary lymph node. She has no other medical problems but does smoke 1 pack per day., 08/12/18- right mastectomy with ALND, 10/26/18- pt had expander removed because of infection, she has completed chemo and radiation    Patient Stated Goals  to get rid of these spasms    Currently in Pain?  No/denies                       [facility] Adult PT Treatment/Exercise - 03/09/19 0001      Shoulder Exercises: Pulleys   Flexion  2 minutes    Flexion Limitations  Pt returned therapist demo  and then VCs during to decrease Rt UE compensation    ABduction  2 minutes    ABduction Limitations  Pt returned therapist demo and VCs to keep arms in abduction      Shoulder Exercises: Therapy Ball   Flexion  10 reps   forward lean into end of stretch     Manual Therapy   Passive ROM  to Rt shoulder as tolerated in direction of flexion, abduction, and ER and D2                  PT Long Term Goals - 02/25/19 8466      PT LONG TERM GOAL #1   Title  Pt will report a 75% improvement in right chest pain to decrease discomfort caused by spasms.    Baseline  65% improvement reported at this time-02/01/19, 02/25/19- 75% improved    Time  4    Period  Weeks    Status  Achieved      PT LONG TERM GOAL #2   Title  Pt will be independent in a home exercise program for continued strengthening and stretching    Baseline  Pt is independent with initial HEP for AA/ROM, is notyet ready for strength due to tightness/spasms-02/01/19    Time  4    Period  Weeks    Status  On-going      PT LONG TERM GOAL #3   Title  Pt will report a 25% improvement in swelling in right chest to allow improved comfort.    Baseline  30% improvement reported at this time-02/01/19, 02/25/19- pt reports she is now having more swelling, it has worsened    Time  4    Period  Weeks    Status  On-going      PT LONG TERM GOAL #4   Title  Pt will demonstrate 150 degrees of right shoulder flexion to allow her to reach overhead.    Baseline  116; 126 degrees- 02/01/19, 02/25/19- 127 degrees    Time  4    Period  Weeks    Status  On-going      PT LONG TERM GOAL #5   Title  Pt will demonstrate 140 degrees of right shoulder abduction to allow her to reach out to the side.    Baseline  89; 106 degrees - 02/01/19, 02/25/19- 130    Time  4    Period  Weeks    Status  On-going            Plan - 03/09/19 1001    Clinical Impression Statement  Pt needed to leave early today so did will save review of supine  scapular series to next session. Did add AA/ROM stretching which she tolerated well. Then continued with manual therapy but only focused on P/ROM due to time.    Personal Factors and Comorbidities  Comorbidity 1;Comorbidity 2;Other    Comorbidities  depression, anxiety, lives alone    Examination-Activity Limitations  Reach Overhead;Carry    Examination-Participation Restrictions  Cleaning    Stability/Clinical Decision Making  Evolving/Moderate complexity    Rehab Potential  Good    Clinical Impairments Affecting Rehab Potential  hx of radiation, hx of infections in right chest    PT Frequency  2x / week    PT Duration  4 weeks    PT Treatment/Interventions  ADLs/Self Care Home Management;Therapeutic exercise;Patient/family education;Orthotic Fit/Training;Manual techniques;Manual lymph drainage;Scar mobilization;Passive range of motion;Taping;Joint Manipulations    PT Next Visit Plan  MLD to Rt chest and issue instructions, Cont gentle P/ROM of Rt shoulder, and STM to Rt chest and scar massage; review supine scapular series and see how pt is tolerating this    PT Home Exercise Plan  supine dowel ex, behind the head stretch, horizontal abduction stretch; supine scapular series    Consulted and Agree with Plan of Care  Patient       Patient will benefit from skilled therapeutic intervention in order to improve the following deficits and impairments:  Decreased knowledge of precautions, Impaired UE functional use, Decreased range of motion, Postural dysfunction, Pain, Decreased scar mobility, Increased fascial restricitons, Decreased strength, Increased edema  Visit Diagnosis: 1. Stiffness of right shoulder, not elsewhere classified   2. Postmastectomy lymphedema   3. Chronic right shoulder pain   4. Disorder of the skin and subcutaneous tissue related to radiation, unspecified   5. Abnormal posture   6. Malignant neoplasm of upper-outer quadrant of right breast in female, estrogen receptor  negative [facility])        Problem List Patient Active Problem List   Diagnosis Date Noted  . Genetic testing 10/01/2018  . Family history of breast cancer   . Family history of uterine cancer   . Family history of prostate cancer   . S/P mastectomy, right 08/20/2018  . Acquired absence of right breast 08/20/2018  . Port-A-Cath in place 06/21/2018  . MDD (major depressive disorder), recurrent episode, moderate ([place]) 06/13/2018  . Generalized anxiety disorder 06/13/2018  . OCD (obsessive compulsive disorder) 06/13/2018  . Insomnia 06/13/2018  . Malignant neoplasm of upper-outer quadrant of right breast in female, estrogen receptor negative ([place]) 03/02/2018  . Needs flu shot 06/03/2017  . Chronic bilateral low back pain without sciatica 06/03/2017  . Adjustment disorder with anxious mood 03/27/2016  . Benign paroxysmal positional vertigo 10/31/2014  . Arthritis 04/27/2014  . Fatigue 06/10/2013  . Morbid obesity ([place]) 06/10/2013  . Asthma, chronic obstructive, with acute exacerbation ([place]) 06/10/2013  . Allergy 06/10/2013    Otelia Limes, PTA 03/09/2019, 10:39 AM  [place], [place], [address] Phone: [phone]   Fax:  [phone]  Name: Jacqueline Good MRN: [id number] Date of Birth: 01-13-1961

## 2019-03-09 NOTE — Progress Notes (Signed)
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  She continues on trastuzumab with good tolerance. She reports swelling of her ankles bilaterally which likely is not related but she says she was told that it was related to Herceptin..  Since her last visit, she underwent repeat echocardiogram on 02/18/2019. This showed an ejection fractions of 55-60%.  An order for left screening mammogram was placed at her last visit on 01/28/2019, but this has not yet been scheduled.  She is currently receiving some physical therapy to improve her range of motion.   REVIEW OF SYSTEMS: Jacqueline Good reports she is planning on reconstruction, which she believes will be in January 2021. She also notes she will see Dr. Lucia Gaskins in October. She lives alone, and her mother lives next door. A detailed review of systems was otherwise entirely negative.   HISTORY OF CURRENT ILLNESS: From the original intake note:  Jacqueline Good had routine screening mammography on 02/05/2018 showing a possible abnormality in the right breast. She underwent unilateral right diagnostic mammography with tomography and right breast ultrasonography at [facility] on 02/15/2018 showing: Highly suspicious right breast mass at 10 o'clock  position.  There was a second, 0.6 cm lesion at the 10:00 radiant which has not been biopsied.  Suspicious focal cortical thickening of a single right axillary lymph node.  Accordingly on 02/25/2018 she proceeded to biopsy of the right breast area in question. The pathology from this procedure showed ([id number]): Breast, right, needle core biopsy, OUQ with microscopic focus of invasive ductal carcinoma, grade 2, arising in a background of high grade ductal carcinoma in situ. 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   The patient's subsequent history is as detailed below.    PAST MEDICAL HISTORY: Past Medical History:  Diagnosis Date  . Anemia   . Anxiety   . Arthritis    "all over" (08/12/2018)  . Asthma   . Breast cancer, right breast ([place]) dx'd 02/2018  . Chronic bronchitis ([place])   . Depression   . Family history of breast cancer   . Family history of prostate cancer   . Family history of uterine cancer   . GERD (gastroesophageal reflux disease)   . History of blood transfusion    "several; related to low blood" (08/12/2018)  . History of radiation therapy 11/15/18- 12/17/18   Right Chest wall, SCV, PAB. 25 fractions.     PAST SURGICAL HISTORY: Past Surgical History:  Procedure Laterality Date  . AXILLARY LYMPH NODE DISSECTION Right 08/12/2018   Procedure: AXILLARY LYMPH NODE DISSECTION;  Surgeon: Alphonsa Overall, MD;  Location: [facility];  Service: General;  Laterality: Right;  . BREAST BIOPSY Right 03/2018  . BREAST RECONSTRUCTION WITH PLACEMENT OF TISSUE EXPANDER AND FLEX HD (ACELLULAR HYDRATED DERMIS) Right 08/12/2018   Procedure: RIGHT BREAST RECONSTRUCTION WITH PLACEMENT OF TISSUE EXPANDER  AND FLEX HD (ACELLULAR HYDRATED DERMIS);  Surgeon: Wallace Going, DO;  Location: [facility];  Service: Plastics;  Laterality: Right;  . DILATION AND CURETTAGE OF UTERUS    . ENDOMETRIAL ABLATION    . IR IMAGING GUIDED PORT INSERTION   03/18/2018  . MASTECTOMY MODIFIED RADICAL Right 08/12/2018   w/axillary LND  . MASTECTOMY MODIFIED RADICAL Right 08/12/2018   Procedure: RIGHT MODIFIED RADICAL MASTECTOMY;  Surgeon: Alphonsa Overall, MD;  Location: [facility];  Service: General;  Laterality: Right;  . MYOMECTOMY    . REMOVAL OF TISSUE EXPANDER AND PLACEMENT OF IMPLANT Right 10/26/2018   Procedure: Removal of right breast expander;  Surgeon: Wallace Going, DO;  Location: [facility];  Service: Plastics;  Laterality: Right;  75 min, please    FAMILY HISTORY: Family History  Problem Relation Age of Onset  . Lupus Sister   . Heart disease Sister   . Breast cancer Mother 31  . Prostate cancer Paternal Uncle   . Diabetes Maternal Grandmother   . Heart Problems Maternal Grandmother   . Prostate cancer Maternal Grandfather   . Prostate cancer Paternal Grandfather   . Prostate cancer Other        MGFs brother  . Breast cancer Other        Mat great-grandmother's sister  . Uterine cancer Maternal Great-grandmother   . Prostate cancer Other        PGFs brother   She notes that her father is currently 58 years old. Patients' mother is currently 56 years old and she was diagnosed with breast cancer at age 77. The patient has 4 brothers and 1 sister. She has a maternal aunt with breast cancer and her maternal great grandmother with had ovarian cancer.    GYNECOLOGIC HISTORY:  No LMP recorded. Patient is postmenopausal. Menarche: 58 years old Age at first live birth: 58 years old GX P: 1 LMP: 13-14 years ago s/p ablation Contraceptive: no HRT: no  Hysterectomy: no SO: no   SOCIAL HISTORY: She is currently unemployed.  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 She lives alone without pets. Her daughter is Jacqueline Good who lives in [place] and works in Therapist, art.  The patient has one grandchild. She goes to a [employer].   ADVANCED DIRECTIVES: Not in place.  At the 03/03/2018 visit the patient  was given the appropriate documents to complete and notarized at her discretion   HEALTH MAINTENANCE: Social History   Tobacco Use  . Smoking status: Former Smoker    Packs/day: 1.00    Years: 38.00    Pack years: 38.00    Types: Cigarettes    Quit date: 05/15/2018    Years since quitting: 0.8  . Smokeless tobacco: Never Used  Substance Use Topics  . Alcohol use: Not Currently    Comment: 08/12/2018 "nothing since 04/2018"  . Drug use: No     Colonoscopy: Never  PAP:   Bone density: Never   Allergies  Allergen Reactions  . Gadolinium Derivatives Itching and Cough    Pt began sneezing and coughing as soon as MRI contrast was injected. Severe nasal congestion. No rash or hives no SOB.   Marland Kitchen Hydrocodone Hives and Itching    Pt says she can take it with benadryl.   . Iodinated Diagnostic Agents Itching    Current Outpatient Medications  Medication Sig Dispense Refill  . albuterol (VENTOLIN HFA) 108 (90 Base) MCG/ACT inhaler INHALE 2 PUFFS INTO THE LUNGS EVERY 6  HOURS AS NEEDED FOR WHEEZING 18 g 2  . ALPRAZolam (XANAX) 1 MG tablet 1 po q 6 hr prn.  No more than 3.5 qd. 105 tablet 0  . [START ON 03/18/2019] ALPRAZolam (XANAX) 1 MG tablet 1 mg three times and 0.5 mg per day as needed for anxiety. No more than 3.5 mg per day 105 tablet 0  . ARIPiprazole (ABILIFY) 2 MG tablet Take 1 tablet (2 mg total) by mouth daily. 90 tablet 0  . diphenoxylate-atropine (LOMOTIL) 2.5-0.025 MG tablet Take 1 tablet by mouth 4 (four) times daily as needed for diarrhea or loose stools. 30 tablet 0  . esomeprazole (NEXIUM) 40 MG capsule TAKE ONE CAPSULE BY MOUTH DAILY 30 capsule 2  . fluticasone (FLONASE) 50 MCG/ACT nasal spray Place 2 sprays into both nostrils daily as needed for up to 14 days for allergies. 1 g 1  . Fluticasone-Salmeterol (ADVAIR DISKUS) 250-50 MCG/DOSE AEPB INHALE 1 PUFF INTO THE LUNGS 2 TIMES DAILY. 60 each 2  . gabapentin (NEURONTIN) 300 MG capsule Take 1 capsule (300 mg total) by mouth  at bedtime. 30 capsule 5  . lidocaine-prilocaine (EMLA) cream Apply to affected area once (Patient taking differently: Apply 1 application topically daily as needed (prior to port being flushed.). ) 30 g 3  . loratadine (CLARITIN) 10 MG tablet Take 1 tablet (10 mg total) by mouth daily as needed for allergies. 30 tablet 11  . mirtazapine (REMERON) 15 MG tablet 7.5 mg at night for one week, then 15 mg at night 30 tablet 1  . naproxen (NAPROSYN) 500 MG tablet As needed.    Marland Kitchen omeprazole (PRILOSEC) 40 MG capsule TAKE 1 CAPSULE BY MOUTH DAILY. 30 capsule 2  . propranolol (INDERAL) 40 MG tablet Take 40 mg by mouth daily as needed (heart palpitation.).     Marland Kitchen venlafaxine (EFFEXOR) 75 MG tablet Take 3 tablets (225 mg total) by mouth daily. 270 tablet 0  . zolpidem (AMBIEN) 10 MG tablet Take 1 tablet (10 mg total) by mouth at bedtime as needed for sleep (1/2-1 tab). 30 tablet 1   No current facility-administered medications for this visit.     OBJECTIVE: Morbidly obese African-American woman in no acute distress  Vitals:   03/10/19 0937  BP: 110/77  Pulse: 78  Resp: 20  Temp: 98.7 F (37.1 C)  SpO2: 97%     Body mass index is 37.09 kg/m.   Wt Readings from Last 3 Encounters:  03/10/19 222 lb 14.4 oz (101.1 kg)  01/28/19 210 lb 6.4 oz (95.4 kg)  01/18/19 207 lb 12.8 oz (94.3 kg)  ECOG FS:1 - Symptomatic but completely ambulatory  Sclerae unicteric, EOMs intact Wearing a mask No cervical or supraclavicular adenopathy Lungs no rales or rhonchi Heart regular rate and rhythm Abd soft, nontender, positive bowel sounds MSK no focal spinal tenderness, bilateral grade 1 ankle swelling Neuro: nonfocal, well oriented, appropriate affect Breasts: The right breast is status post mastectomy.  The chest wall is very irregular, but there is no evidence of local recurrence.  The left breast is benign.  Both axillae are benign.       LAB RESULTS:  CMP     Component Value Date/Time   NA 143  02/17/2019 0836   K 4.1 02/17/2019 0836   CL 109 02/17/2019 0836   CO2 23 02/17/2019 0836   GLUCOSE 112 (H) 02/17/2019 0836   BUN 29 (H) 02/17/2019 0836   CREATININE 0.95 02/17/2019 0836   CREATININE 0.86 01/05/2019  0350   CREATININE 0.83 09/24/2016 1151   CALCIUM 9.0 02/17/2019 0836   PROT 6.9 02/17/2019 0836   ALBUMIN 3.6 02/17/2019 0836   AST 17 02/17/2019 0836   AST 14 (L) 01/05/2019 0832   ALT 19 02/17/2019 0836   ALT 19 01/05/2019 0832   ALKPHOS 101 02/17/2019 0836   BILITOT 0.4 02/17/2019 0836   BILITOT 0.3 01/05/2019 0832   GFRNONAA >60 02/17/2019 0836   GFRNONAA >60 01/05/2019 0832   GFRNONAA 79 09/24/2016 1151   GFRAA >60 02/17/2019 0836   GFRAA >60 01/05/2019 0832   GFRAA >89 09/24/2016 1151    No results found for: TOTALPROTELP, ALBUMINELP, A1GS, A2GS, BETS, BETA2SER, GAMS, MSPIKE, SPEI  No results found for: KPAFRELGTCHN, LAMBDASER, KAPLAMBRATIO  Lab Results  Component Value Date   WBC 6.1 03/10/2019   NEUTROABS 4.9 03/10/2019   HGB 9.6 (L) 03/10/2019   HCT 29.6 (L) 03/10/2019   MCV 90.5 03/10/2019   PLT 195 03/10/2019    '@LASTCHEMISTRY'$ @  No results found for: LABCA2  No components found for: KXFGHW299  No results for input(s): INR in the last 168 hours.  No results found for: LABCA2  No results found for: BZJ696  No results found for: VEL381  No results found for: OFB510  No results found for: CA2729  No components found for: HGQUANT  No results found for: CEA1 / No results found for: CEA1   No results found for: AFPTUMOR  No results found for: CHROMOGRNA  No results found for: PSA1  Appointment on 03/10/2019  Component Date Value Ref Range Status  . WBC 03/10/2019 6.1  4.0 - 10.5 K/uL Final  . RBC 03/10/2019 3.27* 3.87 - 5.11 MIL/uL Final  . Hemoglobin 03/10/2019 9.6* 12.0 - 15.0 g/dL Final  . HCT 03/10/2019 29.6* 36.0 - 46.0 % Final  . MCV 03/10/2019 90.5  80.0 - 100.0 fL Final  . MCH 03/10/2019 29.4  26.0 - 34.0 pg Final  .  MCHC 03/10/2019 32.4  30.0 - 36.0 g/dL Final  . RDW 03/10/2019 15.4  11.5 - 15.5 % Final  . Platelets 03/10/2019 195  150 - 400 K/uL Final  . nRBC 03/10/2019 0.0  0.0 - 0.2 % Final  . Neutrophils Relative % 03/10/2019 79  % Final  . Neutro Abs 03/10/2019 4.9  1.7 - 7.7 K/uL Final  . Lymphocytes Relative 03/10/2019 13  % Final  . Lymphs Abs 03/10/2019 0.8  0.7 - 4.0 K/uL Final  . Monocytes Relative 03/10/2019 6  % Final  . Monocytes Absolute 03/10/2019 0.4  0.1 - 1.0 K/uL Final  . Eosinophils Relative 03/10/2019 2  % Final  . Eosinophils Absolute 03/10/2019 0.1  0.0 - 0.5 K/uL Final  . Basophils Relative 03/10/2019 0  % Final  . Basophils Absolute 03/10/2019 0.0  0.0 - 0.1 K/uL Final  . Immature Granulocytes 03/10/2019 0  % Final  . Abs Immature Granulocytes 03/10/2019 0.01  0.00 - 0.07 K/uL Final   Performed at [facility], [place] [address], [place], [place] [address]    (this displays the last labs from the last 3 days)  No results found for: TOTALPROTELP, ALBUMINELP, A1GS, A2GS, BETS, BETA2SER, GAMS, MSPIKE, SPEI (this displays SPEP labs)  No results found for: KPAFRELGTCHN, LAMBDASER, KAPLAMBRATIO (kappa/lambda light chains)  No results found for: HGBA, HGBA2QUANT, HGBFQUANT, HGBSQUAN (Hemoglobinopathy evaluation)   No results found for: LDH  No results found for: IRON, TIBC, IRONPCTSAT (Iron and TIBC)  No results found for:  FERRITIN  Urinalysis    Component Value Date/Time   COLORURINE CANCELED 09/24/2016 1151   APPEARANCEUR CANCELED 09/24/2016 1151   LABSPEC CANCELED 09/24/2016 1151   PHURINE CANCELED 09/24/2016 1151   GLUCOSEU CANCELED 09/24/2016 1151   HGBUR CANCELED 09/24/2016 1151   BILIRUBINUR CANCELED 09/24/2016 1151   KETONESUR CANCELED 09/24/2016 1151   PROTEINUR CANCELED 09/24/2016 1151   NITRITE CANCELED 09/24/2016 1151   LEUKOCYTESUR CANCELED 09/24/2016 1151     STUDIES:  No results found.   ELIGIBLE FOR AVAILABLE  RESEARCH PROTOCOL:BCEP   ASSESSMENT: 58 y.o. [place] woman status post right breast upper outer quadrant biopsy 02/25/2018 for a clinical T2 pN1, stage IIB invasive ductal carcinoma, estrogen and progesterone receptor negative, HER-2 amplified, with an MIB-1 of 50%.  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 STK11, TP53, TSC1, TSC2, and VHL.  The following genes were evaluated for sequence changes only: SDHA and HOXB13 c.251G>A variant only.   (2) neoadjuvant chemotherapy consisting of carboplatin, docetaxel, trastuzumab and pertuzumab every 21 days x 6 starting 03/23/2018, completed 07/12/2018  (a) pertuzumab omitted after cycle 1 due to poorly-controlled diarrhea  (b) docetaxel discontinued after cycle 3 due to neuropathy; gemcitabine substituted  (c) Granix substituted for Neulasta on days 3-6 following chemotherapy due to improved tolerance  (d) Switched back to Neulasta after cycles 5 and 6 per patient request  (3) anti-HER-2 treatment will continue for 12 months (through AUG 2020)  (a) baseline echocardiogram 03/16/2018 shows an ejection fraction in the 55-60% range.  (b) echo on 11/15 shows EF of 55-60%  (c) echo 02/18/2019 shows EF 55-60%  (4) status post right modified radical mastectomy 08/12/2018 showing a complete pathologic response (ypT0 ypN0)  (a) a total of 11 regional lymph nodes removed  (b) expander in place  (5) adjuvant radiation  11/15/2018-12/17/2018: Right chest wall 50 Gy in 25 fractions; Right CW, SCV,PAB: 50 Gy in 25 fractions  (6) left adrenal adenoma measuring 2.9 cm on CT of the chest 03/16/2018   PLAN: Daliana is tolerating the trastuzumab well and she has 1 more dose 3 weeks from today after which she may have her port removed.  I have sent her surgeon a note regarding that.  She complains of pain in her legs.  She has tried gabapentin and she has tried Aleve and Tylenol.  I think perhaps with some physical therapy she might get some relief.  I suggested she obtain some compression stockings for the ankle swelling through the physical therapy folks  We are anticipating reconstruction sometime early next year.  She is due for left mammography and I have reentered the order today.  She will see [place] again in 3 weeks so we can celebrate her completion of Herceptin.  She will then return to see me in December.  She knows to call for any other issue that may develop before that visit.      [name]. Aylene Acoff, MD  03/10/19 9:55 AM Medical Oncology and Hematology [facility] [address] [place], [place] [address] Tel. [phone]  Fax. [phone]   I, Wilburn Mylar, am acting as scribe for Dr. [name]. Raiana Pharris.  I, Lurline Del MD, have reviewed the above documentation for accuracy and completeness, and I agree with the above.

## 2019-03-10 ENCOUNTER — Inpatient Hospital Stay: Payer: Medicaid Other

## 2019-03-10 ENCOUNTER — Other Ambulatory Visit: Payer: Self-pay

## 2019-03-10 ENCOUNTER — Inpatient Hospital Stay (HOSPITAL_BASED_OUTPATIENT_CLINIC_OR_DEPARTMENT_OTHER): Payer: Medicaid Other | Admitting: Oncology

## 2019-03-10 ENCOUNTER — Encounter: Payer: Self-pay | Admitting: *Deleted

## 2019-03-10 VITALS — BP 110/77 | HR 78 | Temp 98.7°F | Resp 20 | Ht 65.0 in | Wt 222.9 lb

## 2019-03-10 DIAGNOSIS — Z171 Estrogen receptor negative status [ER-]: Secondary | ICD-10-CM

## 2019-03-10 DIAGNOSIS — Z95828 Presence of other vascular implants and grafts: Secondary | ICD-10-CM

## 2019-03-10 DIAGNOSIS — C50411 Malignant neoplasm of upper-outer quadrant of right female breast: Secondary | ICD-10-CM

## 2019-03-10 DIAGNOSIS — Z923 Personal history of irradiation: Secondary | ICD-10-CM

## 2019-03-10 DIAGNOSIS — Z5112 Encounter for antineoplastic immunotherapy: Secondary | ICD-10-CM | POA: Diagnosis not present

## 2019-03-10 DIAGNOSIS — J441 Chronic obstructive pulmonary disease with (acute) exacerbation: Secondary | ICD-10-CM

## 2019-03-10 DIAGNOSIS — J45901 Unspecified asthma with (acute) exacerbation: Secondary | ICD-10-CM

## 2019-03-10 LAB — CBC WITH DIFFERENTIAL/PLATELET
Abs Immature Granulocytes: 0.01 10*3/uL (ref 0.00–0.07)
Basophils Absolute: 0 10*3/uL (ref 0.0–0.1)
Basophils Relative: 0 %
Eosinophils Absolute: 0.1 10*3/uL (ref 0.0–0.5)
Eosinophils Relative: 2 %
HCT: 29.6 % — ABNORMAL LOW (ref 36.0–46.0)
Hemoglobin: 9.6 g/dL — ABNORMAL LOW (ref 12.0–15.0)
Immature Granulocytes: 0 %
Lymphocytes Relative: 13 %
Lymphs Abs: 0.8 10*3/uL (ref 0.7–4.0)
MCH: 29.4 pg (ref 26.0–34.0)
MCHC: 32.4 g/dL (ref 30.0–36.0)
MCV: 90.5 fL (ref 80.0–100.0)
Monocytes Absolute: 0.4 10*3/uL (ref 0.1–1.0)
Monocytes Relative: 6 %
Neutro Abs: 4.9 10*3/uL (ref 1.7–7.7)
Neutrophils Relative %: 79 %
Platelets: 195 10*3/uL (ref 150–400)
RBC: 3.27 MIL/uL — ABNORMAL LOW (ref 3.87–5.11)
RDW: 15.4 % (ref 11.5–15.5)
WBC: 6.1 10*3/uL (ref 4.0–10.5)
nRBC: 0 % (ref 0.0–0.2)

## 2019-03-10 LAB — COMPREHENSIVE METABOLIC PANEL
ALT: 24 U/L (ref 0–44)
AST: 20 U/L (ref 15–41)
Albumin: 3.4 g/dL — ABNORMAL LOW (ref 3.5–5.0)
Alkaline Phosphatase: 93 U/L (ref 38–126)
Anion gap: 10 (ref 5–15)
BUN: 23 mg/dL — ABNORMAL HIGH (ref 6–20)
CO2: 23 mmol/L (ref 22–32)
Calcium: 9.1 mg/dL (ref 8.9–10.3)
Chloride: 108 mmol/L (ref 98–111)
Creatinine, Ser: 0.84 mg/dL (ref 0.44–1.00)
GFR calc Af Amer: >60 mL/min (ref >60)
GFR calc non Af Amer: >60 mL/min (ref >60)
Glucose, Bld: 99 mg/dL (ref 70–99)
Potassium: 3.9 mmol/L (ref 3.5–5.1)
Sodium: 141 mmol/L (ref 135–145)
Total Bilirubin: 0.4 mg/dL (ref 0.3–1.2)
Total Protein: 6.7 g/dL (ref 6.5–8.1)

## 2019-03-10 MED ORDER — DIPHENHYDRAMINE HCL 25 MG PO CAPS
25.0000 mg | ORAL_CAPSULE | Freq: Once | ORAL | Status: AC
  Administered 2019-03-10: 25 mg via ORAL

## 2019-03-10 MED ORDER — SODIUM CHLORIDE 0.9% FLUSH
10.0000 mL | INTRAVENOUS | Status: DC | PRN
  Administered 2019-03-10: 10 mL
  Filled 2019-03-10: qty 10

## 2019-03-10 MED ORDER — DIPHENHYDRAMINE HCL 25 MG PO CAPS
ORAL_CAPSULE | ORAL | Status: AC
  Filled 2019-03-10: qty 1

## 2019-03-10 MED ORDER — TRASTUZUMAB CHEMO 150 MG IV SOLR
600.0000 mg | Freq: Once | INTRAVENOUS | Status: AC
  Administered 2019-03-10: 600 mg via INTRAVENOUS
  Filled 2019-03-10: qty 28.57

## 2019-03-10 MED ORDER — SODIUM CHLORIDE 0.9 % IV SOLN
Freq: Once | INTRAVENOUS | Status: AC
  Administered 2019-03-10: 11:00:00 via INTRAVENOUS
  Filled 2019-03-10: qty 250

## 2019-03-10 MED ORDER — ACETAMINOPHEN 325 MG PO TABS
ORAL_TABLET | ORAL | Status: AC
  Filled 2019-03-10: qty 2

## 2019-03-10 MED ORDER — ACETAMINOPHEN 325 MG PO TABS
650.0000 mg | ORAL_TABLET | Freq: Once | ORAL | Status: AC
  Administered 2019-03-10: 650 mg via ORAL

## 2019-03-10 MED ORDER — SODIUM CHLORIDE 0.9% FLUSH
10.0000 mL | Freq: Once | INTRAVENOUS | Status: AC
  Administered 2019-03-10: 10:00:00 10 mL
  Filled 2019-03-10: qty 10

## 2019-03-10 MED ORDER — HEPARIN SOD (PORK) LOCK FLUSH 100 UNIT/ML IV SOLN
500.0000 [IU] | Freq: Once | INTRAVENOUS | Status: AC | PRN
  Administered 2019-03-10: 500 [IU]
  Filled 2019-03-10: qty 5

## 2019-03-11 MED FILL — ESOMEPRAZOLE MAG DR 40 MG C: 40 | 30 days supply | Qty: 30 | Fill #1

## 2019-03-11 MED FILL — NAPROXEN 500 MG TABLET: 500 | 30 days supply | Qty: 60 | Fill #2

## 2019-03-14 ENCOUNTER — Other Ambulatory Visit: Payer: Self-pay

## 2019-03-14 ENCOUNTER — Ambulatory Visit: Payer: Medicaid Other | Attending: Oncology

## 2019-03-14 DIAGNOSIS — R293 Abnormal posture: Secondary | ICD-10-CM | POA: Diagnosis present

## 2019-03-14 DIAGNOSIS — G8929 Other chronic pain: Secondary | ICD-10-CM | POA: Diagnosis present

## 2019-03-14 DIAGNOSIS — I972 Postmastectomy lymphedema syndrome: Secondary | ICD-10-CM

## 2019-03-14 DIAGNOSIS — Z171 Estrogen receptor negative status [ER-]: Secondary | ICD-10-CM | POA: Insufficient documentation

## 2019-03-14 DIAGNOSIS — C50411 Malignant neoplasm of upper-outer quadrant of right female breast: Secondary | ICD-10-CM | POA: Diagnosis present

## 2019-03-14 DIAGNOSIS — L599 Disorder of the skin and subcutaneous tissue related to radiation, unspecified: Secondary | ICD-10-CM | POA: Diagnosis present

## 2019-03-14 DIAGNOSIS — M25511 Pain in right shoulder: Secondary | ICD-10-CM | POA: Diagnosis present

## 2019-03-14 DIAGNOSIS — M25611 Stiffness of right shoulder, not elsewhere classified: Secondary | ICD-10-CM

## 2019-03-14 NOTE — Therapy (Addendum)
[place], [place], [address] Phone: [phone]   Fax:  [phone]  Physical Therapy Treatment  Patient Details  Name: Jacqueline Good MRN: [id number] Date of Birth: 1961/01/08 Referring Provider (PT): Spickard   Encounter Date: 03/14/2019  PT End of Session - 03/14/19 0953    Visit Number  9    Number of Visits  16    Date for PT Re-Evaluation  03/22/19    Authorization Type  Medicaid- orginally approved for 3 visits, just got approval for 12 more visits from 7/1 to 8/11    Authorization - Visit Number  5    Authorization - Number of Visits  12    PT Start Time  0901    PT Stop Time  0953    PT Time Calculation (min)  52 min    Activity Tolerance  Patient tolerated treatment well    Behavior During Therapy  [facility] for tasks assessed/performed       Past Medical History:  Diagnosis Date  . Anemia   . Anxiety   . Arthritis    "all over" (08/12/2018)  . Asthma   . Breast cancer, right breast ([place]) dx'd 02/2018  . Chronic bronchitis ([place])   . Depression   . Family history of breast cancer   . Family history of prostate cancer   . Family history of uterine cancer   . GERD (gastroesophageal reflux disease)   . History of blood transfusion    "several; related to low blood" (08/12/2018)  . History of radiation therapy 11/15/18- 12/17/18   Right Chest wall, SCV, PAB. 25 fractions.     Past Surgical History:  Procedure Laterality Date  . AXILLARY LYMPH NODE DISSECTION Right 08/12/2018   Procedure: AXILLARY LYMPH NODE DISSECTION;  Surgeon: Alphonsa Overall, MD;  Location: [place];  Service: General;  Laterality: Right;  . BREAST BIOPSY Right 03/2018  . BREAST RECONSTRUCTION WITH PLACEMENT OF TISSUE EXPANDER AND FLEX HD (ACELLULAR HYDRATED DERMIS) Right 08/12/2018   Procedure: RIGHT BREAST RECONSTRUCTION WITH PLACEMENT OF TISSUE EXPANDER AND FLEX HD (ACELLULAR HYDRATED DERMIS);  Surgeon: Wallace Going, DO;   Location: [place];  Service: Plastics;  Laterality: Right;  . DILATION AND CURETTAGE OF UTERUS    . ENDOMETRIAL ABLATION    . IR IMAGING GUIDED PORT INSERTION  03/18/2018  . MASTECTOMY MODIFIED RADICAL Right 08/12/2018   w/axillary LND  . MASTECTOMY MODIFIED RADICAL Right 08/12/2018   Procedure: RIGHT MODIFIED RADICAL MASTECTOMY;  Surgeon: Alphonsa Overall, MD;  Location: [place];  Service: General;  Laterality: Right;  . MYOMECTOMY    . REMOVAL OF TISSUE EXPANDER AND PLACEMENT OF IMPLANT Right 10/26/2018   Procedure: Removal of right breast expander;  Surgeon: Wallace Going, DO;  Location: [place];  Service: Plastics;  Laterality: Right;  75 min, please    There were no vitals filed for this visit.  Subjective Assessment - 03/14/19 0901    Subjective  My Rt shoulder is doing okay still but I did have a muscle spasm Friday. But I had chemo Thursday and was in the bed all day Friday so I'm sure that's what it was from, just laying bed and getting tight. The doctor does agree my bil ankle swelling is probably from chemo and garments would be helpful for this.    Pertinent History  Patient was diagnosed on 02/04/18 with right invasive ductal carcinoma breast cancer. It measures 3.1 cm and is located in the  upper outer quadrant. It is ER/PR negative and HER2 positive with a Ki67 of 50%. She has a known positive axillary lymph node. She has no other medical problems but does smoke 1 pack per day., 08/12/18- right mastectomy with ALND, 10/26/18- pt had expander removed because of infection, she has completed chemo and radiation    Patient Stated Goals  to get rid of these spasms    Currently in Pain?  No/denies         [facility] PT Assessment - 03/14/19 0001      AROM   Right Shoulder Flexion  135 Degrees    Right Shoulder ABduction  133 Degrees    Right Shoulder Internal Rotation  50 Degrees                   [facility] Adult PT Treatment/Exercise - 03/14/19 0001      Shoulder Exercises: Supine    Horizontal ABduction  Strengthening;Both;10 reps;Theraband    Theraband Level (Shoulder Horizontal ABduction)  Level 2 (Red)    External Rotation  Strengthening;Both;5 reps;Theraband    Theraband Level (Shoulder External Rotation)  Level 2 (Red)    External Rotation Limitations  More difficlut so only 5 with red band today    Flexion  Strengthening;Both;5 reps;Theraband   Narrow and Wide grip, 5 times each   Theraband Level (Shoulder Flexion)  Level 2 (Red)    Diagonals  Strengthening;Right;Left;5 reps;Theraband    Theraband Level (Shoulder Diagonals)  Level 2 (Red)    Diagonals Limitations  Pt required VCs for correct UE technique , this one very challenging for pt.       Shoulder Exercises: Pulleys   Flexion  2 minutes    Flexion Limitations  Still requiring minor VCs to decrease Rt scapular compensation    ABduction  2 minutes    ABduction Limitations  Also still rewuiring VCs to decrease Rt scapular compnesation      Manual Therapy   Soft tissue mobilization  to Rt pectoralis body origin and insertion and some gentle work attempted at the scar but with pt still apprehensive about touching the incision.  Reminded her that the faster she starts working on it and touching it the faster it will feel normal and soften up.     Manual Lymphatic Drainage (MLD)  in supine: short neck, 5 diaphragmatic breaths, left axillary nodes and establishment of interaxillary pathway, right inguinal nodes and establishment of axillo inguinal pathway then R chest and lateral trunk moving fluid towards pathways    Passive ROM  to Rt shoulder as tolerated in direction of flexion, abduction, and ER and D2                  PT Long Term Goals - 02/25/19 0834      PT LONG TERM GOAL #1   Title  Pt will report a 75% improvement in right chest pain to decrease discomfort caused by spasms.    Baseline  65% improvement reported at this time-02/01/19, 02/25/19- 75% improved    Time  4    Period  Weeks     Status  Achieved      PT LONG TERM GOAL #2   Title  Pt will be independent in a home exercise program for continued strengthening and stretching    Baseline  Pt is independent with initial HEP for AA/ROM, is notyet ready for strength due to tightness/spasms-02/01/19    Time  4    Period  Weeks  Status  On-going      PT LONG TERM GOAL #3   Title  Pt will report a 25% improvement in swelling in right chest to allow improved comfort.    Baseline  30% improvement reported at this time-02/01/19, 02/25/19- pt reports she is now having more swelling, it has worsened    Time  4    Period  Weeks    Status  On-going      PT LONG TERM GOAL #4   Title  Pt will demonstrate 150 degrees of right shoulder flexion to allow her to reach overhead.    Baseline  116; 126 degrees- 02/01/19, 02/25/19- 127 degrees    Time  4    Period  Weeks    Status  On-going      PT LONG TERM GOAL #5   Title  Pt will demonstrate 140 degrees of right shoulder abduction to allow her to reach out to the side.    Baseline  89; 106 degrees - 02/01/19, 02/25/19- 130    Time  4    Period  Weeks    Status  On-going            Plan - 03/14/19 0953    Clinical Impression Statement  Pt continues to have increased tolerance to manual therapy. Had a spasm Friday but that was after being in bed all day due to fatigue from chemo day before making her tighter than she has been. She reports feeling much looser after stretching today and her A/ROM has improved well since start of care with flexion and abduction. IR has decreased but she is doing this now without compensations.Also sent order to Dr. Jana Hakim for bil LE knee high compression stockings for her ankle swelling.    Personal Factors and Comorbidities  Comorbidity 1;Comorbidity 2;Other    Comorbidities  depression, anxiety, lives alone    Examination-Activity Limitations  Reach Overhead;Carry    Examination-Participation Restrictions  Cleaning    Stability/Clinical  Decision Making  Evolving/Moderate complexity    Rehab Potential  Good    Clinical Impairments Affecting Rehab Potential  hx of radiation, hx of infections in right chest    PT Frequency  2x / week    PT Duration  4 weeks    PT Treatment/Interventions  ADLs/Self Care Home Management;Therapeutic exercise;Patient/family education;Orthotic Fit/Training;Manual techniques;Manual lymph drainage;Scar mobilization;Passive range of motion;Taping;Joint Manipulations    PT Next Visit Plan  See if order returned/signed and issue copy to pt. MLD to Rt chest and issue instructions, Cont gentle P/ROM of Rt shoulder, and STM to Rt chest and scar massage; continue with AA/ROM including pulleys and try ball up wall next    PT Home Exercise Plan  supine dowel ex, behind the head stretch, horizontal abduction stretch; supine scapular series (isuued red but pt to cont with yellow until easier)    Consulted and Agree with Plan of Care  Patient       Patient will benefit from skilled therapeutic intervention in order to improve the following deficits and impairments:  Decreased knowledge of precautions, Impaired UE functional use, Decreased range of motion, Postural dysfunction, Pain, Decreased scar mobility, Increased fascial restricitons, Decreased strength, Increased edema  Visit Diagnosis: 1. Stiffness of right shoulder, not elsewhere classified   2. Postmastectomy lymphedema   3. Chronic right shoulder pain   4. Disorder of the skin and subcutaneous tissue related to radiation, unspecified   5. Abnormal posture   6. Malignant neoplasm of upper-outer quadrant of  right breast in female, estrogen receptor negative [facility])        Problem List Patient Active Problem List   Diagnosis Date Noted  . Genetic testing 10/01/2018  . Family history of breast cancer   . Family history of uterine cancer   . Family history of prostate cancer   . S/P mastectomy, right 08/20/2018  . Acquired absence of right breast  08/20/2018  . Port-A-Cath in place 06/21/2018  . MDD (major depressive disorder), recurrent episode, moderate ([place]) 06/13/2018  . Generalized anxiety disorder 06/13/2018  . OCD (obsessive compulsive disorder) 06/13/2018  . Insomnia 06/13/2018  . Malignant neoplasm of upper-outer quadrant of right breast in female, estrogen receptor negative ([place]) 03/02/2018  . Needs flu shot 06/03/2017  . Chronic bilateral low back pain without sciatica 06/03/2017  . Adjustment disorder with anxious mood 03/27/2016  . Benign paroxysmal positional vertigo 10/31/2014  . Arthritis 04/27/2014  . Fatigue 06/10/2013  . Morbid obesity ([place]) 06/10/2013  . Asthma, chronic obstructive, with acute exacerbation ([place]) 06/10/2013  . Allergy 06/10/2013    Otelia Limes, PTA 03/14/2019, 10:05 PM  [place], [place], [address] Phone: [phone]   Fax:  [phone]  Name: DASHONDA BONNEAU MRN: [id number] Date of Birth: 21-Apr-1961

## 2019-03-16 ENCOUNTER — Ambulatory Visit: Payer: Medicaid Other

## 2019-03-16 ENCOUNTER — Other Ambulatory Visit: Payer: Self-pay

## 2019-03-16 DIAGNOSIS — C50411 Malignant neoplasm of upper-outer quadrant of right female breast: Secondary | ICD-10-CM

## 2019-03-16 DIAGNOSIS — L599 Disorder of the skin and subcutaneous tissue related to radiation, unspecified: Secondary | ICD-10-CM

## 2019-03-16 DIAGNOSIS — G8929 Other chronic pain: Secondary | ICD-10-CM

## 2019-03-16 DIAGNOSIS — M25611 Stiffness of right shoulder, not elsewhere classified: Secondary | ICD-10-CM

## 2019-03-16 DIAGNOSIS — I972 Postmastectomy lymphedema syndrome: Secondary | ICD-10-CM

## 2019-03-16 DIAGNOSIS — M25511 Pain in right shoulder: Secondary | ICD-10-CM

## 2019-03-16 DIAGNOSIS — Z171 Estrogen receptor negative status [ER-]: Secondary | ICD-10-CM

## 2019-03-16 DIAGNOSIS — R293 Abnormal posture: Secondary | ICD-10-CM

## 2019-03-16 NOTE — Therapy (Signed)
[place], [place], [address] Phone: [phone]   Fax:  [phone]  Physical Therapy Treatment  Patient Details  Name: Jacqueline Good MRN: [id number] Date of Birth: Oct 02, 1960 Referring Provider (PT): [name]   Encounter Date: 03/16/2019  PT End of Session - 03/16/19 5883    Visit Number  10    Number of Visits  16    Date for PT Re-Evaluation  03/22/19    Authorization Type  Medicaid- orginally approved for 3 visits, just got approval for 12 more visits from 7/1 to 8/11    Authorization - Visit Number  6    Authorization - Number of Visits  12    PT Start Time  0903    PT Stop Time  0951    PT Time Calculation (min)  48 min    Activity Tolerance  Patient tolerated treatment well    Behavior During Therapy  [facility] for tasks assessed/performed       Past Medical History:  Diagnosis Date  . Anemia   . Anxiety   . Arthritis    "all over" (08/12/2018)  . Asthma   . Breast cancer, right breast ([place]) dx'd 02/2018  . Chronic bronchitis ([place])   . Depression   . Family history of breast cancer   . Family history of prostate cancer   . Family history of uterine cancer   . GERD (gastroesophageal reflux disease)   . History of blood transfusion    "several; related to low blood" (08/12/2018)  . History of radiation therapy 11/15/18- 12/17/18   Right Chest wall, SCV, PAB. 25 fractions.     Past Surgical History:  Procedure Laterality Date  . AXILLARY LYMPH NODE DISSECTION Right 08/12/2018   Procedure: AXILLARY LYMPH NODE DISSECTION;  Surgeon: Alphonsa Overall, MD;  Location: [place];  Service: General;  Laterality: Right;  . BREAST BIOPSY Right 03/2018  . BREAST RECONSTRUCTION WITH PLACEMENT OF TISSUE EXPANDER AND FLEX HD (ACELLULAR HYDRATED DERMIS) Right 08/12/2018   Procedure: RIGHT BREAST RECONSTRUCTION WITH PLACEMENT OF TISSUE EXPANDER AND FLEX HD (ACELLULAR HYDRATED DERMIS);  Surgeon: Wallace Going, DO;   Location: [place];  Service: Plastics;  Laterality: Right;  . DILATION AND CURETTAGE OF UTERUS    . ENDOMETRIAL ABLATION    . IR IMAGING GUIDED PORT INSERTION  03/18/2018  . MASTECTOMY MODIFIED RADICAL Right 08/12/2018   w/axillary LND  . MASTECTOMY MODIFIED RADICAL Right 08/12/2018   Procedure: RIGHT MODIFIED RADICAL MASTECTOMY;  Surgeon: Alphonsa Overall, MD;  Location: [place];  Service: General;  Laterality: Right;  . MYOMECTOMY    . REMOVAL OF TISSUE EXPANDER AND PLACEMENT OF IMPLANT Right 10/26/2018   Procedure: Removal of right breast expander;  Surgeon: Wallace Going, DO;  Location: [place];  Service: Plastics;  Laterality: Right;  75 min, please    There were no vitals filed for this visit.  Subjective Assessment - 03/16/19 0905    Subjective  I thought I had 3 chemo treatments left but I only have 1! No chest spasms since I was here the other day. But I do get them in my back though that has been off and on for years, I think since chemo they have just been a little more frequent.    Pertinent History  Patient was diagnosed on 02/04/18 with right invasive ductal carcinoma breast cancer. It measures 3.1 cm and is located in the upper outer quadrant. It is ER/PR negative and HER2  positive with a Ki67 of 50%. She has a known positive axillary lymph node. She has no other medical problems but does smoke 1 pack per day., 08/12/18- right mastectomy with ALND, 10/26/18- pt had expander removed because of infection, she has completed chemo and radiation    Patient Stated Goals  to get rid of these spasms    Currently in Pain?  Yes    Pain Score  6     Pain Location  Back    Pain Orientation  Right;Left;Lower    Pain Descriptors / Indicators  Spasm    Pain Type  Chronic pain    Pain Onset  More than a month ago   pt reports off and on for years   Pain Frequency  Occasional    Aggravating Factors   seems to just come and go though since going thru chemo has seemed more frequent (spasms)    Pain  Relieving Factors  only trying to position my body away from pain                       [facility] Adult PT Treatment/Exercise - 03/16/19 0001      Shoulder Exercises: Pulleys   Flexion  2 minutes    Flexion Limitations  VCs to decrease Rt scapular compensation throughout    ABduction  2 minutes    ABduction Limitations  VCs to decrease Rt scapular compensation throughout      Shoulder Exercises: Therapy Ball   Flexion  10 reps   forward lean into end of stretch   Flexion Limitations  Pt with back spasm with first but with further instruction on essentially holding a pelvic tilt, she had no spasms with remaining reps    ABduction  Right;5 reps   same side lean into end of stretch     Shoulder Exercises: Stretch   Wall Stretch - ABduction  5 reps   5 sec holds, "snow angel" against wall   Wall Stretch - ABduction Limitations  pt with very limited tolerance and end ROM, reports did feel good stretch but pain as well.       Manual Therapy   Soft tissue mobilization  to Rt pectoralis body origin and insertion and some gentle work attempted at the scar, pt with much improved tolerance to this now    Myofascial Release  Corss hands technique at Rt chest wall horizontally and vertically, pt able to tolerate increased pressure today    Manual Lymphatic Drainage (MLD)  in supine: short neck, 5 diaphragmatic breaths, left axillary nodes and establishment of interaxillary pathway, right inguinal nodes and establishment of axillo inguinal pathway then Rt chest and lateral trunk moving fluid towards pathways    Passive ROM  to Rt shoulder as tolerated in direction of flexion, abduction, and ER and D2                  PT Long Term Goals - 02/25/19 0834      PT LONG TERM GOAL #1   Title  Pt will report a 75% improvement in right chest pain to decrease discomfort caused by spasms.    Baseline  65% improvement reported at this time-02/01/19, 02/25/19- 75% improved    Time  4     Period  Weeks    Status  Achieved      PT LONG TERM GOAL #2   Title  Pt will be independent in a home exercise program for continued strengthening  and stretching    Baseline  Pt is independent with initial HEP for AA/ROM, is notyet ready for strength due to tightness/spasms-02/01/19    Time  4    Period  Weeks    Status  On-going      PT LONG TERM GOAL #3   Title  Pt will report a 25% improvement in swelling in right chest to allow improved comfort.    Baseline  30% improvement reported at this time-02/01/19, 02/25/19- pt reports she is now having more swelling, it has worsened    Time  4    Period  Weeks    Status  On-going      PT LONG TERM GOAL #4   Title  Pt will demonstrate 150 degrees of right shoulder flexion to allow her to reach overhead.    Baseline  116; 126 degrees- 02/01/19, 02/25/19- 127 degrees    Time  4    Period  Weeks    Status  On-going      PT LONG TERM GOAL #5   Title  Pt will demonstrate 140 degrees of right shoulder abduction to allow her to reach out to the side.    Baseline  89; 106 degrees - 02/01/19, 02/25/19- 130    Time  4    Period  Weeks    Status  On-going            Plan - 03/16/19 0953    Clinical Impression Statement  Continued with gently progressing AA/A/ROM stetching. Pt tolerated all well except abduction against wall ("snow angel") was very challenging as she felt pain with stretching. Continued with maual therapy working towards progressing her P/ROM and decrease tissue tightness at Rt chest wall. Her sensitivity is much improved tolerating increased pressure today with myofascial release.    Personal Factors and Comorbidities  Comorbidity 1;Comorbidity 2;Other    Comorbidities  depression, anxiety, lives alone    Examination-Activity Limitations  Reach Overhead;Carry    Examination-Participation Restrictions  Cleaning    Stability/Clinical Decision Making  Evolving/Moderate complexity    Rehab Potential  Good    Clinical Impairments  Affecting Rehab Potential  hx of radiation, hx of infections in right chest    PT Frequency  2x / week    PT Duration  4 weeks    PT Treatment/Interventions  ADLs/Self Care Home Management;Therapeutic exercise;Patient/family education;Orthotic Fit/Training;Manual techniques;Manual lymph drainage;Scar mobilization;Passive range of motion;Taping;Joint Manipulations    PT Next Visit Plan  See if order returned/signed and issue copy to pt. Measure A/ROM and assess goals; MLD to Rt chest and issue instructions, Cont gentle P/ROM of Rt shoulder, and STM to Rt chest and scar massage; continue with AA/ROM including pulleys and try ball up wall next    PT Home Exercise Plan  supine dowel ex, behind the head stretch, horizontal abduction stretch; supine scapular series (isuued red but pt to cont with yellow until easier)    Consulted and Agree with Plan of Care  Patient       Patient will benefit from skilled therapeutic intervention in order to improve the following deficits and impairments:  Decreased knowledge of precautions, Impaired UE functional use, Decreased range of motion, Postural dysfunction, Pain, Decreased scar mobility, Increased fascial restricitons, Decreased strength, Increased edema  Visit Diagnosis: 1. Stiffness of right shoulder, not elsewhere classified   2. Postmastectomy lymphedema   3. Chronic right shoulder pain   4. Disorder of the skin and subcutaneous tissue related to radiation, unspecified  5. Abnormal posture   6. Malignant neoplasm of upper-outer quadrant of right breast in female, estrogen receptor negative [facility])        Problem List Patient Active Problem List   Diagnosis Date Noted  . Genetic testing 10/01/2018  . Family history of breast cancer   . Family history of uterine cancer   . Family history of prostate cancer   . S/P mastectomy, right 08/20/2018  . Acquired absence of right breast 08/20/2018  . Port-A-Cath in place 06/21/2018  . MDD (major  depressive disorder), recurrent episode, moderate ([place]) 06/13/2018  . Generalized anxiety disorder 06/13/2018  . OCD (obsessive compulsive disorder) 06/13/2018  . Insomnia 06/13/2018  . Malignant neoplasm of upper-outer quadrant of right breast in female, estrogen receptor negative ([place]) 03/02/2018  . Needs flu shot 06/03/2017  . Chronic bilateral low back pain without sciatica 06/03/2017  . Adjustment disorder with anxious mood 03/27/2016  . Benign paroxysmal positional vertigo 10/31/2014  . Arthritis 04/27/2014  . Fatigue 06/10/2013  . Morbid obesity ([place]) 06/10/2013  . Asthma, chronic obstructive, with acute exacerbation ([place]) 06/10/2013  . Allergy 06/10/2013    Otelia Limes, PTA 03/16/2019, 10:01 AM  [place], [place], [address] Phone: [phone]   Fax:  [phone]  Name: Jacqueline Good MRN: [id number] Date of Birth: [birth year]

## 2019-03-21 ENCOUNTER — Encounter: Payer: Self-pay | Admitting: Rehabilitation

## 2019-03-21 ENCOUNTER — Ambulatory Visit: Payer: Medicaid Other | Admitting: Rehabilitation

## 2019-03-21 ENCOUNTER — Other Ambulatory Visit: Payer: Self-pay

## 2019-03-21 DIAGNOSIS — M25611 Stiffness of right shoulder, not elsewhere classified: Secondary | ICD-10-CM

## 2019-03-21 DIAGNOSIS — C50411 Malignant neoplasm of upper-outer quadrant of right female breast: Secondary | ICD-10-CM

## 2019-03-21 DIAGNOSIS — Z171 Estrogen receptor negative status [ER-]: Secondary | ICD-10-CM

## 2019-03-21 DIAGNOSIS — L599 Disorder of the skin and subcutaneous tissue related to radiation, unspecified: Secondary | ICD-10-CM

## 2019-03-21 DIAGNOSIS — I972 Postmastectomy lymphedema syndrome: Secondary | ICD-10-CM

## 2019-03-21 DIAGNOSIS — R293 Abnormal posture: Secondary | ICD-10-CM

## 2019-03-21 DIAGNOSIS — G8929 Other chronic pain: Secondary | ICD-10-CM

## 2019-03-21 MED FILL — GABAPENTIN 300 MG CAPSULE: 300 | 30 days supply | Qty: 30 | Fill #0

## 2019-03-21 NOTE — Therapy (Signed)
[place], [place], [address] Phone: [phone]   Fax:  [phone]  Physical Therapy Treatment  Patient Details  Name: Jacqueline Good MRN: [id number] Date of Birth: Mar 23, 1961 Referring Provider (PT): [name]   Encounter Date: 03/21/2019  PT End of Session - 03/21/19 1246    Visit Number  11    Number of Visits  16    Date for PT Re-Evaluation  03/22/19    Authorization Type  Medicaid- orginally approved for 3 visits, just got approval for 12 more visits from 7/1 to 8/11    Authorization - Visit Number  7    Authorization - Number of Visits  12    PT Start Time  1130    PT Stop Time  1215    PT Time Calculation (min)  45 min    Activity Tolerance  Patient tolerated treatment well    Behavior During Therapy  [facility] for tasks assessed/performed       Past Medical History:  Diagnosis Date  . Anemia   . Anxiety   . Arthritis    "all over" (08/12/2018)  . Asthma   . Breast cancer, right breast ([place]) dx'd 02/2018  . Chronic bronchitis ([place])   . Depression   . Family history of breast cancer   . Family history of prostate cancer   . Family history of uterine cancer   . GERD (gastroesophageal reflux disease)   . History of blood transfusion    "several; related to low blood" (08/12/2018)  . History of radiation therapy 11/15/18- 12/17/18   Right Chest wall, SCV, PAB. 25 fractions.     Past Surgical History:  Procedure Laterality Date  . AXILLARY LYMPH NODE DISSECTION Right 08/12/2018   Procedure: AXILLARY LYMPH NODE DISSECTION;  Surgeon: Alphonsa Overall, MD;  Location: [place];  Service: General;  Laterality: Right;  . BREAST BIOPSY Right 03/2018  . BREAST RECONSTRUCTION WITH PLACEMENT OF TISSUE EXPANDER AND FLEX HD (ACELLULAR HYDRATED DERMIS) Right 08/12/2018   Procedure: RIGHT BREAST RECONSTRUCTION WITH PLACEMENT OF TISSUE EXPANDER AND FLEX HD (ACELLULAR HYDRATED DERMIS);  Surgeon: Wallace Going,  DO;  Location: [place];  Service: Plastics;  Laterality: Right;  . DILATION AND CURETTAGE OF UTERUS    . ENDOMETRIAL ABLATION    . IR IMAGING GUIDED PORT INSERTION  03/18/2018  . MASTECTOMY MODIFIED RADICAL Right 08/12/2018   w/axillary LND  . MASTECTOMY MODIFIED RADICAL Right 08/12/2018   Procedure: RIGHT MODIFIED RADICAL MASTECTOMY;  Surgeon: Alphonsa Overall, MD;  Location: [place];  Service: General;  Laterality: Right;  . MYOMECTOMY    . REMOVAL OF TISSUE EXPANDER AND PLACEMENT OF IMPLANT Right 10/26/2018   Procedure: Removal of right breast expander;  Surgeon: Wallace Going, DO;  Location: [facility];  Service: Plastics;  Laterality: Right;  75 min, please    There were no vitals filed for this visit.  Subjective Assessment - 03/21/19 1134    Subjective  I had been doing great but then this weekend I started getting more spasm and I think I feel tighter.  The spasms happen usually at rest.  Saturday, Sunday, and even today.    Pertinent History  Patient was diagnosed on 02/04/18 with right invasive ductal carcinoma breast cancer. It measures 3.1 cm and is located in the upper outer quadrant. It is ER/PR negative and HER2 positive with a Ki67 of 50%. She has a known positive axillary lymph node. She has no other medical  problems but does smoke 1 pack per day., 08/12/18- right mastectomy with ALND, 10/26/18- pt had expander removed because of infection, she has completed chemo and radiation    Currently in Pain?  Yes    Pain Score  6     Pain Location  Back    Pain Orientation  Right;Left;Lower    Pain Descriptors / Indicators  Spasm    Pain Onset  More than a month ago    Pain Frequency  Occasional                       [facility] Adult PT Treatment/Exercise - 03/21/19 0001      Shoulder Exercises: Pulleys   Flexion  2 minutes    ABduction  2 minutes      Shoulder Exercises: Therapy Ball   Flexion  10 reps    ABduction  --    ABduction Limitations  attempted but with too much  spasm today; switched to abduction finger ladder x 5       Shoulder Exercises: Stretch   Other Shoulder Stretches  single arm doorway stretch 2x30"       Manual Therapy   Soft tissue mobilization  to Rt pectoralis body origin and insertion and some gentle scar work    Myofascial Release  Cross hands technique at Rt chest wall horizontally and vertically, pt able to tolerate increased pressure today    Manual Lymphatic Drainage (MLD)  in supine: right inguinal nodes and establishment of axillo inguinal pathway then Rt chest and lateral trunk moving fluid towards pathway; focus on Lateral chest wall    Passive ROM  to Rt shoulder as tolerated in direction of flexion, abduction, and ER and D2                  PT Long Term Goals - 02/25/19 0834      PT LONG TERM GOAL #1   Title  Pt will report a 75% improvement in right chest pain to decrease discomfort caused by spasms.    Baseline  65% improvement reported at this time-02/01/19, 02/25/19- 75% improved    Time  4    Period  Weeks    Status  Achieved      PT LONG TERM GOAL #2   Title  Pt will be independent in a home exercise program for continued strengthening and stretching    Baseline  Pt is independent with initial HEP for AA/ROM, is notyet ready for strength due to tightness/spasms-02/01/19    Time  4    Period  Weeks    Status  On-going      PT LONG TERM GOAL #3   Title  Pt will report a 25% improvement in swelling in right chest to allow improved comfort.    Baseline  30% improvement reported at this time-02/01/19, 02/25/19- pt reports she is now having more swelling, it has worsened    Time  4    Period  Weeks    Status  On-going      PT LONG TERM GOAL #4   Title  Pt will demonstrate 150 degrees of right shoulder flexion to allow her to reach overhead.    Baseline  116; 126 degrees- 02/01/19, 02/25/19- 127 degrees    Time  4    Period  Weeks    Status  On-going      PT LONG TERM GOAL #5   Title  Pt will demonstrate  140  degrees of right shoulder abduction to allow her to reach out to the side.    Baseline  89; 106 degrees - 02/01/19, 02/25/19- 130    Time  4    Period  Weeks    Status  On-going            Plan - 03/21/19 1246    Clinical Impression Statement  Pt arrives reporting more pain and spasm in the chest. Pt frustrated with this change.  Able to tolerate treatment well except for ball abduction switching to the ladder.  Talked about more prolonged hold stretches like doorway and dowel as she has moved away from these and has just been doing supine scap lately.  Pt has one more visit tomorrow and will think about what she wants to do.  Unsure if she wants more visits.  Has 5 visits left per medicaid but has run out of time to use them. Pt will get meaured for stockings Monday.  Script on the bulletin board for [employer]    PT Next Visit Plan  Measure A/ROM and assess goals; MLD to Rt chest and issue instructions, Cont gentle P/ROM of Rt shoulder, and STM to Rt chest and scar massage; continue with AA/ROM including pulleys and try ball up wall next    Consulted and Agree with Plan of Care  Patient       Patient will benefit from skilled therapeutic intervention in order to improve the following deficits and impairments:     Visit Diagnosis: 1. Stiffness of right shoulder, not elsewhere classified   2. Postmastectomy lymphedema   3. Chronic right shoulder pain   4. Disorder of the skin and subcutaneous tissue related to radiation, unspecified   5. Abnormal posture   6. Malignant neoplasm of upper-outer quadrant of right breast in female, estrogen receptor negative [facility])        Problem List Patient Active Problem List   Diagnosis Date Noted  . Genetic testing 10/01/2018  . Family history of breast cancer   . Family history of uterine cancer   . Family history of prostate cancer   . S/P mastectomy, right 08/20/2018  . Acquired absence of right breast 08/20/2018  . Port-A-Cath in place  06/21/2018  . MDD (major depressive disorder), recurrent episode, moderate ([place]) 06/13/2018  . Generalized anxiety disorder 06/13/2018  . OCD (obsessive compulsive disorder) 06/13/2018  . Insomnia 06/13/2018  . Malignant neoplasm of upper-outer quadrant of right breast in female, estrogen receptor negative ([place]) 03/02/2018  . Needs flu shot 06/03/2017  . Chronic bilateral low back pain without sciatica 06/03/2017  . Adjustment disorder with anxious mood 03/27/2016  . Benign paroxysmal positional vertigo 10/31/2014  . Arthritis 04/27/2014  . Fatigue 06/10/2013  . Morbid obesity ([place]) 06/10/2013  . Asthma, chronic obstructive, with acute exacerbation ([place]) 06/10/2013  . Allergy 06/10/2013    Stark Bray 03/21/2019, 12:50 PM  [place], [place], [address] Phone: [phone]   Fax:  [phone]  Name: Jacqueline Good MRN: [id number] Date of Birth: Jan 03, 1961

## 2019-03-22 ENCOUNTER — Ambulatory Visit: Payer: Medicaid Other | Admitting: Physical Therapy

## 2019-03-22 ENCOUNTER — Other Ambulatory Visit: Payer: Self-pay

## 2019-03-22 ENCOUNTER — Encounter: Payer: Self-pay | Admitting: Physical Therapy

## 2019-03-22 DIAGNOSIS — I972 Postmastectomy lymphedema syndrome: Secondary | ICD-10-CM

## 2019-03-22 DIAGNOSIS — R293 Abnormal posture: Secondary | ICD-10-CM

## 2019-03-22 DIAGNOSIS — G8929 Other chronic pain: Secondary | ICD-10-CM

## 2019-03-22 DIAGNOSIS — L599 Disorder of the skin and subcutaneous tissue related to radiation, unspecified: Secondary | ICD-10-CM

## 2019-03-22 DIAGNOSIS — M25611 Stiffness of right shoulder, not elsewhere classified: Secondary | ICD-10-CM | POA: Diagnosis not present

## 2019-03-22 DIAGNOSIS — Z171 Estrogen receptor negative status [ER-]: Secondary | ICD-10-CM

## 2019-03-22 DIAGNOSIS — C50411 Malignant neoplasm of upper-outer quadrant of right female breast: Secondary | ICD-10-CM

## 2019-03-22 NOTE — Patient Instructions (Signed)
Self manual lymph drainage: Perform this sequence once a day.  Only give enough pressure no your skin to make the skin move.  Diaphragmatic - Supine   Inhale through nose making navel move out toward hands. Exhale through puckered lips, hands follow navel in. Repeat _5__ times. Rest _10__ seconds between repeats.   Copyright  VHI. All rights reserved.  Hug yourself.  Do circles at your neck just above your collarbones.  Repeat this 10 times.  Axilla - One at a Time   Using full weight of flat hand and fingers at center of uninvolved armpit, make _10__ in-place circles.   Copyright  VHI. All rights reserved.  LEG: Inguinal Nodes Stimulation   With small finger side of hand against hip crease on involved side, gently perform circles at the crease. Repeat __10_ times.   Copyright  VHI. All rights reserved.  1) Axilla to Inguinal Nodes - Sweep   On involved side, stretch skin _10-12_ times from armpit along side of trunk to hip crease.  Now gently stretch skin from the involved side to the uninvolved side across the chest at the shoulder line.  Repeat that 10 times.   Stretch skin in area of swelling on stretch and direct fluid towards pathways at chest as shoulder line and towards pathway along side of trunk towards hip crease.        Finish by doing the pathways as described above going from your involved armpit to the same side groin and going across your upper chest from the involved shoulder to the uninvolved shoulder.  Repeat the steps above where you do circles in your right groin and left armpit. Copyright  VHI. All rights reserved.

## 2019-03-22 NOTE — Therapy (Signed)
[place], [place], [address] Phone: [phone]   Fax:  [phone]  Physical Therapy Treatment  Patient Details  Name: Jacqueline Good MRN: [id number] Date of Birth: 09-Oct-1960 Referring Provider (PT): [name]   Encounter Date: 03/22/2019  PT End of Session - 03/22/19 1120    Visit Number  12    Number of Visits  16    Date for PT Re-Evaluation  05/03/19    Authorization Type  Medicaid- orginally approved for 3 visits, just got approval for 12 more visits from 7/1 to 8/11, sending for approval for 4 additional visits 03/22/19    Authorization - Visit Number  8    Authorization - Number of Visits  12    PT Start Time  1030    PT Stop Time  1116    PT Time Calculation (min)  46 min    Activity Tolerance  Patient tolerated treatment well    Behavior During Therapy  [facility] for tasks assessed/performed       Past Medical History:  Diagnosis Date  . Anemia   . Anxiety   . Arthritis    "all over" (08/12/2018)  . Asthma   . Breast cancer, right breast ([place]) dx'd 02/2018  . Chronic bronchitis ([place])   . Depression   . Family history of breast cancer   . Family history of prostate cancer   . Family history of uterine cancer   . GERD (gastroesophageal reflux disease)   . History of blood transfusion    "several; related to low blood" (08/12/2018)  . History of radiation therapy 11/15/18- 12/17/18   Right Chest wall, SCV, PAB. 25 fractions.     Past Surgical History:  Procedure Laterality Date  . AXILLARY LYMPH NODE DISSECTION Right 08/12/2018   Procedure: AXILLARY LYMPH NODE DISSECTION;  Surgeon: Alphonsa Overall, MD;  Location: [facility];  Service: General;  Laterality: Right;  . BREAST BIOPSY Right 03/2018  . BREAST RECONSTRUCTION WITH PLACEMENT OF TISSUE EXPANDER AND FLEX HD (ACELLULAR HYDRATED DERMIS) Right 08/12/2018   Procedure: RIGHT BREAST RECONSTRUCTION WITH PLACEMENT OF TISSUE EXPANDER AND FLEX HD  (ACELLULAR HYDRATED DERMIS);  Surgeon: Wallace Going, DO;  Location: [facility];  Service: Plastics;  Laterality: Right;  . DILATION AND CURETTAGE OF UTERUS    . ENDOMETRIAL ABLATION    . IR IMAGING GUIDED PORT INSERTION  03/18/2018  . MASTECTOMY MODIFIED RADICAL Right 08/12/2018   w/axillary LND  . MASTECTOMY MODIFIED RADICAL Right 08/12/2018   Procedure: RIGHT MODIFIED RADICAL MASTECTOMY;  Surgeon: Alphonsa Overall, MD;  Location: [facility];  Service: General;  Laterality: Right;  . MYOMECTOMY    . REMOVAL OF TISSUE EXPANDER AND PLACEMENT OF IMPLANT Right 10/26/2018   Procedure: Removal of right breast expander;  Surgeon: Wallace Going, DO;  Location: [facility];  Service: Plastics;  Laterality: Right;  75 min, please    There were no vitals filed for this visit.  Subjective Assessment - 03/22/19 1032    Subjective  I did not have any spasms yesterday.    Pertinent History  Patient was diagnosed on 02/04/18 with right invasive ductal carcinoma breast cancer. It measures 3.1 cm and is located in the upper outer quadrant. It is ER/PR negative and HER2 positive with a Ki67 of 50%. She has a known positive axillary lymph node. She has no other medical problems but does smoke 1 pack per day., 08/12/18- right mastectomy with ALND, 10/26/18- pt had expander removed  because of infection, she has completed chemo and radiation    Patient Stated Goals  to get rid of these spasms    Currently in Pain?  No/denies    Pain Score  0-No pain                       [facility] Adult PT Treatment/Exercise - 03/22/19 0001      Manual Therapy   Soft tissue mobilization  to Rt pectoralis body origin and insertion    Manual Lymphatic Drainage (MLD)  in supine: right inguinal nodes and establishment of axillo inguinal pathway then Rt chest and lateral trunk moving fluid towards pathway; focus on Lateral chest wall- issued handout for this today and walked pt through handout and had her demonstrate techinique, she  did require mod verbal cues to perform correctly and to get proper skin stretch    Passive ROM  to Rt shoulder as tolerated in direction of flexion, abduction, and ER and D2                  PT Long Term Goals - 03/22/19 1033      PT LONG TERM GOAL #1   Title  Pt will report a 75% improvement in right chest pain to decrease discomfort caused by spasms.    Baseline  65% improvement reported at this time-02/01/19, 02/25/19- 75% improved    Time  4    Period  Weeks    Status  Achieved      PT LONG TERM GOAL #2   Title  Pt will be independent in a home exercise program for continued strengthening and stretching    Baseline  Pt is independent with initial HEP for AA/ROM, is notyet ready for strength due to tightness/spasms-02/01/19, 03/22/19- pt is now indep in strengthenign and stretching exercises    Time  4    Period  Weeks    Status  Achieved      PT LONG TERM GOAL #3   Title  Pt will report a 25% improvement in swelling in right chest to allow improved comfort.    Baseline  30% improvement reported at this time-02/01/19, 02/25/19- pt reports she is now having more swelling, it has worsened, 03/22/19- 15% improved    Time  4    Period  Weeks    Status  On-going      PT LONG TERM GOAL #4   Title  Pt will demonstrate 150 degrees of right shoulder flexion to allow her to reach overhead.    Baseline  116; 126 degrees- 02/01/19, 02/25/19- 127 degrees 03/22/19- R 151    Time  4    Period  Weeks    Status  Achieved      PT LONG TERM GOAL #5   Title  Pt will demonstrate 140 degrees of right shoulder abduction to allow her to reach out to the side.    Baseline  89; 106 degrees - 02/01/19, 02/25/19- 130, 03/22/19- 124    Time  4    Period  Weeks    Status  On-going            Plan - 03/22/19 1121    Clinical Impression Statement  Assessed pt's progress towards goals in therapy. Pt has now met her flexion ROM goal and is progressing towards her abduction ROM goal. She is still  having increased pec tightness which is limiting her R shoulder abduction ROM. She has been compliant with   her home exercise program. While she did not have any spasms yesterday she had a lot of spasms over the weekened with no known cause. At this point pt would benefit from continued skilled PT services to continue to decrease R pec tightness and improve R shoulder abduction ROM. Will decrease frequency to 1x/wk and request another 4 visits from Medicaid.    PT Frequency  Other (comment)   4 visits per auth - approx 1x/wk - asked for 6 weeks   PT Duration  6 weeks    PT Treatment/Interventions  ADLs/Self Care Home Management;Therapeutic exercise;Patient/family education;Orthotic Fit/Training;Manual techniques;Manual lymph drainage;Scar mobilization;Passive range of motion;Taping;Joint Manipulations    PT Next Visit Plan  assess indep with self MLD, Cont gentle P/ROM of Rt shoulder, and STM to Rt chest and scar massage; continue with AA/ROM including pulleys and try ball up wall next    PT Home Exercise Plan  supine dowel ex, behind the head stretch, horizontal abduction stretch; supine scapular series (isuued red but pt to cont with yellow until easier)    Consulted and Agree with Plan of Care  Patient       Patient will benefit from skilled therapeutic intervention in order to improve the following deficits and impairments:  Decreased knowledge of precautions, Impaired UE functional use, Decreased range of motion, Postural dysfunction, Pain, Decreased scar mobility, Increased fascial restricitons, Decreased strength, Increased edema  Visit Diagnosis: 1. Stiffness of right shoulder, not elsewhere classified   2. Postmastectomy lymphedema   3. Chronic right shoulder pain   4. Disorder of the skin and subcutaneous tissue related to radiation, unspecified   5. Abnormal posture   6. Malignant neoplasm of upper-outer quadrant of right breast in female, estrogen receptor negative ([facility])         Problem List Patient Active Problem List   Diagnosis Date Noted  . Genetic testing 10/01/2018  . Family history of breast cancer   . Family history of uterine cancer   . Family history of prostate cancer   . S/P mastectomy, right 08/20/2018  . Acquired absence of right breast 08/20/2018  . Port-A-Cath in place 06/21/2018  . MDD (major depressive disorder), recurrent episode, moderate ([facility]) 06/13/2018  . Generalized anxiety disorder 06/13/2018  . OCD (obsessive compulsive disorder) 06/13/2018  . Insomnia 06/13/2018  . Malignant neoplasm of upper-outer quadrant of right breast in female, estrogen receptor negative ([facility]) 03/02/2018  . Needs flu shot 06/03/2017  . Chronic bilateral low back pain without sciatica 06/03/2017  . Adjustment disorder with anxious mood 03/27/2016  . Benign paroxysmal positional vertigo 10/31/2014  . Arthritis 04/27/2014  . Fatigue 06/10/2013  . Morbid obesity ([facility]) 06/10/2013  . Asthma, chronic obstructive, with acute exacerbation ([facility]) 06/10/2013  . Allergy 06/10/2013    Blaire Breedlove Blue 03/22/2019, 11:26 AM  [facility] [address] [place], [place], [address] Phone: [phone]   Fax:  [phone]  Name: Jette L Cravens MRN: [id number] Date of Birth: 07/22/1961  Blaire Breedlove Blue, PT 03/22/19 11:26 AM  

## 2019-03-24 MED FILL — ADVAIR 250/50 DISKUS: 250-50 | 30 days supply | Qty: 60 | Fill #1

## 2019-03-24 MED FILL — tiZANidine HCL 4 MG TABS: 4 | 30 days supply | Qty: 30 | Fill #1

## 2019-03-24 NOTE — Progress Notes (Signed)
Virtual Visit via Video Note  I connected with Jacqueline Good on 03/30/19 at 11:00 AM EDT by a video enabled telemedicine application and verified that I am speaking with the correct person using two identifiers.   I discussed the limitations of evaluation and management by telemedicine and the availability of in person appointments. The patient expressed understanding and agreed to proceed.     I discussed the assessment and treatment plan with the patient. The patient was provided an opportunity to ask questions and all were answered. The patient agreed with the plan and demonstrated an understanding of the instructions.   The patient was advised to call back or seek an in-person evaluation if the symptoms worsen or if the condition fails to improve as anticipated.  I provided 15 minutes of non-face-to-face time during this encounter.   Norman Clay, MD    [facility] MD/PA/NP OP Progress Note  03/30/2019 11:28 AM Jacqueline Good  MRN:  [id number]  Chief Complaint:  Chief Complaint    Depression; Follow-up     HPI:  This is a follow-up appointment for depression.  She states that she feels less depressed.  Although she used to feel depressed about certain things (which she does not elaborate), she no longer feels depressed about these things.  She meets with her mother every day.  She has not done anything for fun.  She has 5 more PT visits. She agrees that she has learned some practice so that she can continue after completing PT. She has middle insomnia. She has nocturia, and occasionally checks doors at night. She feels fatigue. She has fair concentration. She denies SI. She feels anxious, tense. She had a panic attacks at least a few times. The last one occurred 2.5 weeks ago, although she is unable to recollect the situation. She has fair appetite. She gained some weight. She denies any side effect from mirtazapine or other medication.  Wt Readings from Last 3 Encounters:  03/10/19  222 lb 14.4 oz (101.1 kg)  01/28/19 210 lb 6.4 oz (95.4 kg)  01/18/19 207 lb 12.8 oz (94.3 kg)    Visit Diagnosis:    ICD-10-CM   1. MDD (major depressive disorder), recurrent episode, mild ([place])  F33.0     Past Psychiatric History: Please see initial evaluation for full details. I have reviewed the history. No updates at this time.     Past Medical History:  Past Medical History:  Diagnosis Date  . Anemia   . Anxiety   . Arthritis    "all over" (08/12/2018)  . Asthma   . Breast cancer, right breast ([place]) dx'd 02/2018  . Chronic bronchitis ([place])   . Depression   . Family history of breast cancer   . Family history of prostate cancer   . Family history of uterine cancer   . GERD (gastroesophageal reflux disease)   . History of blood transfusion    "several; related to low blood" (08/12/2018)  . History of radiation therapy 11/15/18- 12/17/18   Right Chest wall, SCV, PAB. 25 fractions.     Past Surgical History:  Procedure Laterality Date  . AXILLARY LYMPH NODE DISSECTION Right 08/12/2018   Procedure: AXILLARY LYMPH NODE DISSECTION;  Surgeon: Alphonsa Overall, MD;  Location: [place];  Service: General;  Laterality: Right;  . BREAST BIOPSY Right 03/2018  . BREAST RECONSTRUCTION WITH PLACEMENT OF TISSUE EXPANDER AND FLEX HD (ACELLULAR HYDRATED DERMIS) Right 08/12/2018   Procedure: RIGHT BREAST RECONSTRUCTION WITH PLACEMENT OF TISSUE EXPANDER  AND FLEX HD (ACELLULAR HYDRATED DERMIS);  Surgeon: Wallace Going, DO;  Location: [place];  Service: Plastics;  Laterality: Right;  . DILATION AND CURETTAGE OF UTERUS    . ENDOMETRIAL ABLATION    . IR IMAGING GUIDED PORT INSERTION  03/18/2018  . MASTECTOMY MODIFIED RADICAL Right 08/12/2018   w/axillary LND  . MASTECTOMY MODIFIED RADICAL Right 08/12/2018   Procedure: RIGHT MODIFIED RADICAL MASTECTOMY;  Surgeon: Alphonsa Overall, MD;  Location: [place];  Service: General;  Laterality: Right;  . MYOMECTOMY    . REMOVAL OF TISSUE EXPANDER AND PLACEMENT OF  IMPLANT Right 10/26/2018   Procedure: Removal of right breast expander;  Surgeon: Wallace Going, DO;  Location: [place];  Service: Plastics;  Laterality: Right;  75 min, please    Family Psychiatric History: Please see initial evaluation for full details. I have reviewed the history. No updates at this time.     Family History:  Family History  Problem Relation Age of Onset  . Lupus Sister   . Heart disease Sister   . Breast cancer Mother 42  . Prostate cancer Paternal Uncle   . Diabetes Maternal Grandmother   . Heart Problems Maternal Grandmother   . Prostate cancer Maternal Grandfather   . Prostate cancer Paternal Grandfather   . Prostate cancer Other        MGFs brother  . Breast cancer Other        Mat great-grandmother's sister  . Uterine cancer Maternal Great-grandmother   . Prostate cancer Other        PGFs brother    Social History:  Social History   Socioeconomic History  . Marital status: Single    Spouse name: Not on file  . Number of children: Not on file  . Years of education: Not on file  . Highest education level: Not on file  Occupational History  . Not on file  Social Needs  . Financial resource strain: Not on file  . Food insecurity    Worry: Not on file    Inability: Not on file  . Transportation needs    Medical: No    Non-medical: No  Tobacco Use  . Smoking status: Former Smoker    Packs/day: 1.00    Years: 38.00    Pack years: 38.00    Types: Cigarettes    Quit date: 05/15/2018    Years since quitting: 0.8  . Smokeless tobacco: Never Used  Substance and Sexual Activity  . Alcohol use: Not Currently    Comment: 08/12/2018 "nothing since 04/2018"  . Drug use: No  . Sexual activity: Not Currently    Birth control/protection: None  Lifestyle  . Physical activity    Days per week: Not on file    Minutes per session: Not on file  . Stress: Not on file  Relationships  . Social Herbalist on phone: Not on file    Gets  together: Not on file    Attends religious service: Not on file    Active member of club or organization: Not on file    Attends meetings of clubs or organizations: Not on file    Relationship status: Not on file  Other Topics Concern  . Not on file  Social History Narrative  . Not on file    Allergies:  Allergies  Allergen Reactions  . Gadolinium Derivatives Itching and Cough    Pt began sneezing and coughing as soon as MRI contrast was injected.  Severe nasal congestion. No rash or hives no SOB.   Marland Kitchen Hydrocodone Hives and Itching    Pt says she can take it with benadryl.   . Iodinated Diagnostic Agents Itching    Metabolic Disorder Labs: Lab Results  Component Value Date   HGBA1C 5.4 04/27/2014   No results found for: PROLACTIN Lab Results  Component Value Date   CHOL 201 (H) 02/18/2019   TRIG 115 02/18/2019   HDL 63 02/18/2019   CHOLHDL 3.2 02/18/2019   VLDL 23 02/18/2019   LDLCALC 115 (H) 02/18/2019   LDLCALC 76 09/24/2016   Lab Results  Component Value Date   TSH 0.58 09/24/2016   TSH 0.696 04/27/2014    Therapeutic Level Labs: No results found for: LITHIUM No results found for: VALPROATE No components found for:  CBMZ  Current Medications: Current Outpatient Medications  Medication Sig Dispense Refill  . albuterol (VENTOLIN HFA) 108 (90 Base) MCG/ACT inhaler INHALE 2 PUFFS INTO THE LUNGS EVERY 6 HOURS AS NEEDED FOR WHEEZING 18 g 2  . ALPRAZolam (XANAX) 1 MG tablet 1 mg three times and 0.5 mg per day as needed for anxiety. No more than 3.5 mg per day 105 tablet 0  . [START ON 04/28/2019] ALPRAZolam (XANAX) 1 MG tablet 1 po q 6 hr prn.  No more than 3.5 qd. 105 tablet 0  . [START ON 04/22/2019] ARIPiprazole (ABILIFY) 2 MG tablet Take 1 tablet (2 mg total) by mouth daily. 90 tablet 0  . diphenoxylate-atropine (LOMOTIL) 2.5-0.025 MG tablet Take 1 tablet by mouth 4 (four) times daily as needed for diarrhea or loose stools. 30 tablet 0  . esomeprazole (NEXIUM) 40  MG capsule TAKE ONE CAPSULE BY MOUTH DAILY 30 capsule 2  . fluticasone (FLONASE) 50 MCG/ACT nasal spray Place 2 sprays into both nostrils daily as needed for up to 14 days for allergies. 1 g 1  . Fluticasone-Salmeterol (ADVAIR DISKUS) 250-50 MCG/DOSE AEPB INHALE 1 PUFF INTO THE LUNGS 2 TIMES DAILY. 60 each 2  . gabapentin (NEURONTIN) 300 MG capsule Take 1 capsule (300 mg total) by mouth at bedtime. 30 capsule 5  . lidocaine-prilocaine (EMLA) cream Apply to affected area once (Patient taking differently: Apply 1 application topically daily as needed (prior to port being flushed.). ) 30 g 3  . loratadine (CLARITIN) 10 MG tablet Take 1 tablet (10 mg total) by mouth daily as needed for allergies. 30 tablet 11  . mirtazapine (REMERON) 15 MG tablet Take 1 tablet (15 mg total) by mouth at bedtime. 90 tablet 0  . naproxen (NAPROSYN) 500 MG tablet As needed.    Marland Kitchen omeprazole (PRILOSEC) 40 MG capsule TAKE 1 CAPSULE BY MOUTH DAILY. 30 capsule 2  . propranolol (INDERAL) 40 MG tablet Take 40 mg by mouth daily as needed (heart palpitation.).     Derrill Memo ON 05/21/2019] venlafaxine (EFFEXOR) 75 MG tablet Take 3 tablets (225 mg total) by mouth daily. 270 tablet 0  . zolpidem (AMBIEN) 10 MG tablet Take 1 tablet (10 mg total) by mouth at bedtime as needed for sleep (1/2-1 tab). 30 tablet 1  . [START ON 04/28/2019] zolpidem (AMBIEN) 10 MG tablet Take 1 tablet (10 mg total) by mouth at bedtime as needed for sleep. 30 tablet 0   No current facility-administered medications for this visit.      Musculoskeletal: Strength & Muscle Tone: N/A Gait & Station: N/A Patient leans: N/A  Psychiatric Specialty Exam: Review of Systems  Psychiatric/Behavioral: Positive for depression. Negative for  hallucinations, memory loss, substance abuse and suicidal ideas. The patient is nervous/anxious and has insomnia.   All other systems reviewed and are negative.   There were no vitals taken for this visit.There is no height or  weight on file to calculate BMI.  General Appearance: Fairly Groomed  Eye Contact:  Minimal  Speech:  Clear and Coherent  Volume:  Normal  Mood:  Anxious  Affect:  Appropriate, Congruent and Restricted  Thought Process:  Coherent  Orientation:  Full (Time, Place, and Person)  Thought Content: Logical   Suicidal Thoughts:  No  Homicidal Thoughts:  No  Memory:  Immediate;   Good  Judgement:  Good  Insight:  Fair  Psychomotor Activity:  Normal  Concentration:  Concentration: Good and Attention Span: Good  Recall:  Good  Fund of Knowledge: Good  Language: Good  Akathisia:  No  Handed:  Right  AIMS (if indicated): not done  Assets:  Communication Skills Desire for Improvement  ADL's:  Intact  Cognition: WNL  Sleep:  Poor   Screenings: GAD-7     Office Visit from 04/22/2018 in [place] Office Visit from 06/03/2017 in [place] Office Visit from 09/24/2016 in [place] Office Visit from 03/27/2016 in [place]  Total GAD-7 Score  11  3  15  16     PHQ2-9     Office Visit from 04/22/2018 in [place] Office Visit from 06/03/2017 in [place] Office Visit from 09/24/2016 in [place] Office Visit from 05/01/2016 in [place] Office Visit from 03/27/2016 in [place]  PHQ-2 Total Score  5  0  2  5  2   PHQ-9 Total Score  15  4  7  8  10        Assessment and Plan:  Jacqueline Good is a 58 y.o. year old female with a history of depression, OCD, estrogen negativeT2 pN1, stage IIB invasive ductal carcinoma, diagnosed07/18/2019s/p mastectomy1/2020,neoadjuvantchemo immunotherapy/on trastzumab,asthma, OA, chronic back pain , who presents for follow up appointment for depression.   # MDD, mild, recurrent  without psychotic features # r/o PTSD She reports overall improvement in depressive symptoms after starting mirtazapine.  Psychosocial stressors includes breast cancer, unemployment, although she declines to elaborate in details.  She also vaguely reported some trauma at the previous work.  Will continue current medication regimen.  Will continue venlafaxine to target depression.  Will continue Abilify as adjunctive treatment for depression.  Discussed risk of EPS and metabolic side effect.  Continue mirtazapine as adjunctive treatment for depression.  Discussed potential metabolic side effect and drowsiness.  Will continue Xanax as needed for anxiety.  Discussed risk of dependence and oversedation.  Will continue Ambien as needed for insomnia.  Although she will greatly benefit from therapy, she declines this option.   Plan I have reviewed and updated plans as below 1. Continue venlafaxine 225 mg daily 2.Continue Abilify 2 mg (limited benefit from higher dose) 3.  Continue mirtazapine 15 mg at night  4.Continue Xanax 1 mg three times a day (and additional 0.5 mg daily) as needed for anxiety  4. Continue Ambien 10 mg at night as needed for insomnia 5. Next appointment: 10/15 at 11 AM for 20 mins, video  Past trials of medication: sertraline, fluoxetine, lexapro,buspar,venlafaxine, duloxetine, bupropion, quetiapine  I have reviewed suicide assessment in detail. No change in the following assessment.   The patient demonstrates the following risk factors for suicide: Chronic risk factors for suicide include:psychiatric disorder ofdepressionand medical illness of breast cancer. Acute risk factorsfor suicide include: unemployment and loss (financial, interpersonal, professional). Protective factorsfor this patient include: positive social support, responsibility to others (children, family), coping skills and hope for the future. Considering these factors, the overall suicide risk at this  point appears to below. Patientisappropriate for outpatient follow up.  Norman Clay, MD 03/30/2019, 11:28 AM

## 2019-03-29 ENCOUNTER — Ambulatory Visit: Payer: Medicaid Other

## 2019-03-30 ENCOUNTER — Other Ambulatory Visit: Payer: Self-pay

## 2019-03-30 ENCOUNTER — Encounter (HOSPITAL_COMMUNITY): Payer: Self-pay | Admitting: Psychiatry

## 2019-03-30 ENCOUNTER — Ambulatory Visit: Payer: Medicaid Other | Admitting: Physical Therapy

## 2019-03-30 ENCOUNTER — Ambulatory Visit (INDEPENDENT_AMBULATORY_CARE_PROVIDER_SITE_OTHER): Payer: Medicaid Other | Admitting: Psychiatry

## 2019-03-30 DIAGNOSIS — M25611 Stiffness of right shoulder, not elsewhere classified: Secondary | ICD-10-CM | POA: Diagnosis not present

## 2019-03-30 DIAGNOSIS — F330 Major depressive disorder, recurrent, mild: Secondary | ICD-10-CM

## 2019-03-30 DIAGNOSIS — R293 Abnormal posture: Secondary | ICD-10-CM

## 2019-03-30 DIAGNOSIS — L599 Disorder of the skin and subcutaneous tissue related to radiation, unspecified: Secondary | ICD-10-CM

## 2019-03-30 DIAGNOSIS — F419 Anxiety disorder, unspecified: Secondary | ICD-10-CM | POA: Diagnosis not present

## 2019-03-30 DIAGNOSIS — G4700 Insomnia, unspecified: Secondary | ICD-10-CM

## 2019-03-30 DIAGNOSIS — G8929 Other chronic pain: Secondary | ICD-10-CM

## 2019-03-30 DIAGNOSIS — M25511 Pain in right shoulder: Secondary | ICD-10-CM

## 2019-03-30 DIAGNOSIS — I972 Postmastectomy lymphedema syndrome: Secondary | ICD-10-CM

## 2019-03-30 MED ORDER — ARIPIPRAZOLE 2 MG PO TABS: 2.0000 mg | ORAL_TABLET | Freq: Every day | ORAL | 0 refills | Status: DC

## 2019-03-30 MED ORDER — ALPRAZOLAM 1 MG PO TABS: ORAL_TABLET | ORAL | 0 refills | Status: DC

## 2019-03-30 MED ORDER — MIRTAZAPINE 15 MG PO TABS: 15.0000 mg | ORAL_TABLET | Freq: Every day | ORAL | 0 refills | Status: DC

## 2019-03-30 MED ORDER — VENLAFAXINE HCL 75 MG PO TABS: 225.0000 mg | ORAL_TABLET | Freq: Every day | ORAL | 0 refills | Status: DC

## 2019-03-30 MED ORDER — ZOLPIDEM TARTRATE 10 MG PO TABS: 10.0000 mg | ORAL_TABLET | Freq: Every evening | ORAL | 0 refills | Status: DC | PRN

## 2019-03-30 MED FILL — ZOLPIDEM TARTRATE 10 MG TAB: 10 | 30 days supply | Qty: 30 | Fill #1

## 2019-03-30 MED FILL — MIRTAZAPINE 15 MG TABLET: 15 | 30 days supply | Qty: 30 | Fill #0

## 2019-03-30 MED FILL — ALPRAZolam 1 MG TABS: 1 | 30 days supply | Qty: 105 | Fill #0

## 2019-03-30 NOTE — Therapy (Signed)
[place], [place], [address] Phone: [phone]   Fax:  [phone]  Physical Therapy Treatment  Patient Details  Name: Jacqueline Good MRN: [id number] Date of Birth: October 10, 1960 Referring Provider (PT): [name]   Encounter Date: 03/30/2019  PT End of Session - 03/30/19 1428    Visit Number  13    Number of Visits  16    Date for PT Re-Evaluation  05/03/19    Authorization Type  Medicaid- orginally approved for 3 visits, just got approval for 12 more visits from 7/1 to 8/11, sending for approval for 4 additional visits 03/22/19, approved til 9/22    Authorization - Visit Number  9    Authorization - Number of Visits  12    PT Start Time  1330    PT Stop Time  1415    PT Time Calculation (min)  45 min    Activity Tolerance  Patient tolerated treatment well    Behavior During Therapy  [facility] for tasks assessed/performed       Past Medical History:  Diagnosis Date  . Anemia   . Anxiety   . Arthritis    "all over" (08/12/2018)  . Asthma   . Breast cancer, right breast ([place]) dx'd 02/2018  . Chronic bronchitis ([place])   . Depression   . Family history of breast cancer   . Family history of prostate cancer   . Family history of uterine cancer   . GERD (gastroesophageal reflux disease)   . History of blood transfusion    "several; related to low blood" (08/12/2018)  . History of radiation therapy 11/15/18- 12/17/18   Right Chest wall, SCV, PAB. 25 fractions.     Past Surgical History:  Procedure Laterality Date  . AXILLARY LYMPH NODE DISSECTION Right 08/12/2018   Procedure: AXILLARY LYMPH NODE DISSECTION;  Surgeon: Alphonsa Overall, MD;  Location: [place];  Service: General;  Laterality: Right;  . BREAST BIOPSY Right 03/2018  . BREAST RECONSTRUCTION WITH PLACEMENT OF TISSUE EXPANDER AND FLEX HD (ACELLULAR HYDRATED DERMIS) Right 08/12/2018   Procedure: RIGHT BREAST RECONSTRUCTION WITH PLACEMENT OF TISSUE EXPANDER  AND FLEX HD (ACELLULAR HYDRATED DERMIS);  Surgeon: Wallace Going, DO;  Location: [place];  Service: Plastics;  Laterality: Right;  . DILATION AND CURETTAGE OF UTERUS    . ENDOMETRIAL ABLATION    . IR IMAGING GUIDED PORT INSERTION  03/18/2018  . MASTECTOMY MODIFIED RADICAL Right 08/12/2018   w/axillary LND  . MASTECTOMY MODIFIED RADICAL Right 08/12/2018   Procedure: RIGHT MODIFIED RADICAL MASTECTOMY;  Surgeon: Alphonsa Overall, MD;  Location: [place];  Service: General;  Laterality: Right;  . MYOMECTOMY    . REMOVAL OF TISSUE EXPANDER AND PLACEMENT OF IMPLANT Right 10/26/2018   Procedure: Removal of right breast expander;  Surgeon: Wallace Going, DO;  Location: [place];  Service: Plastics;  Laterality: Right;  75 min, please    There were no vitals filed for this visit.  Subjective Assessment - 03/30/19 1339    Subjective  Pt states she is doing better.    Pertinent History  Patient was diagnosed on 02/04/18 with right invasive ductal carcinoma breast cancer. It measures 3.1 cm and is located in the upper outer quadrant. It is ER/PR negative and HER2 positive with a Ki67 of 50%. She has a known positive axillary lymph node. She has no other medical problems but does smoke 1 pack per day., 08/12/18- right mastectomy with ALND, 10/26/18- pt had  expander removed because of infection, she has completed chemo and radiation    Currently in Pain?  No/denies                       [facility] Adult PT Treatment/Exercise - 03/30/19 0001      Exercises   Exercises  Shoulder      Shoulder Exercises: Supine   Flexion  AROM;Right;5 reps    Other Supine Exercises  small circles with hand pointed to ceiling       Shoulder Exercises: Sidelying   External Rotation  AROM;Right;10 reps    Flexion  AROM;Right;5 reps    ABduction  AROM;Right;5 reps    Other Sidelying Exercises  diagonals x 5     Other Sidelying Exercises  small circles with hand pointed to ceiling       Manual Therapy   Soft  tissue mobilization  to Rt  pectoralis body origin and insertion in supine then to sidelying for prolonged pressure on trigger points and soft tissue work to posterior shoulder and axilla  Pt had multiple tender points that relaxed with pressure but tended to tense up . she relaxed some with focused effort     Myofascial Release  Cross hands technique at Rt chest wall horizontally and vertically, pt able to tolerate increased pressure today    Passive ROM  to Rt shoulder as tolerated in direction of flexion, abduction, and ER and D2                  PT Long Term Goals - 03/22/19 1033      PT LONG TERM GOAL #1   Title  Pt will report a 75% improvement in right chest pain to decrease discomfort caused by spasms.    Baseline  65% improvement reported at this time-02/01/19, 02/25/19- 75% improved    Time  4    Period  Weeks    Status  Achieved      PT LONG TERM GOAL #2   Title  Pt will be independent in a home exercise program for continued strengthening and stretching    Baseline  Pt is independent with initial HEP for AA/ROM, is notyet ready for strength due to tightness/spasms-02/01/19, 03/22/19- pt is now indep in strengthenign and stretching exercises    Time  4    Period  Weeks    Status  Achieved      PT LONG TERM GOAL #3   Title  Pt will report a 25% improvement in swelling in right chest to allow improved comfort.    Baseline  30% improvement reported at this time-02/01/19, 02/25/19- pt reports she is now having more swelling, it has worsened, 03/22/19- 15% improved    Time  4    Period  Weeks    Status  On-going      PT LONG TERM GOAL #4   Title  Pt will demonstrate 150 degrees of right shoulder flexion to allow her to reach overhead.    Baseline  116; 126 degrees- 02/01/19, 02/25/19- 127 degrees 03/22/19- R 151    Time  4    Period  Weeks    Status  Achieved      PT LONG TERM GOAL #5   Title  Pt will demonstrate 140 degrees of right shoulder abduction to allow her to  reach out to the side.    Baseline  89; 106 degrees - 02/01/19, 02/25/19- 130, 03/22/19- 124    Time  4    Period  Weeks    Status  On-going            Plan - 03/30/19 1429    Clinical Impression Statement  Pt reported she felt better at end of session and was able to move her arm more.  she continues to have pain in anterior chest and posterior shoulder and axilla. She reports she knows the exercises to continue at home.    PT Frequency  1x / week    PT Treatment/Interventions  ADLs/Self Care Home Management;Therapeutic exercise;Patient/family education;Orthotic Fit/Training;Manual techniques;Manual lymph drainage;Scar mobilization;Passive range of motion;Taping;Joint Manipulations    PT Next Visit Plan  assess indep with self MLD, Cont gentle P/ROM of Rt shoulder, and STM to Rt chest and scar massage; continue with AA/ROM including pulleys and try ball up wall next    Consulted and Agree with Plan of Care  Patient       Patient will benefit from skilled therapeutic intervention in order to improve the following deficits and impairments:  Decreased knowledge of precautions, Impaired UE functional use, Decreased range of motion, Postural dysfunction, Pain, Decreased scar mobility, Increased fascial restricitons, Decreased strength, Increased edema  Visit Diagnosis: 1. Stiffness of right shoulder, not elsewhere classified   2. Postmastectomy lymphedema   3. Chronic right shoulder pain   4. Disorder of the skin and subcutaneous tissue related to radiation, unspecified   5. Abnormal posture        Problem List Patient Active Problem List   Diagnosis Date Noted  . Genetic testing 10/01/2018  . Family history of breast cancer   . Family history of uterine cancer   . Family history of prostate cancer   . S/P mastectomy, right 08/20/2018  . Acquired absence of right breast 08/20/2018  . Port-A-Cath in place 06/21/2018  . MDD (major depressive disorder), recurrent episode, moderate  ([place]) 06/13/2018  . Generalized anxiety disorder 06/13/2018  . OCD (obsessive compulsive disorder) 06/13/2018  . Insomnia 06/13/2018  . Malignant neoplasm of upper-outer quadrant of right breast in female, estrogen receptor negative ([place]) 03/02/2018  . Needs flu shot 06/03/2017  . Chronic bilateral low back pain without sciatica 06/03/2017  . Adjustment disorder with anxious mood 03/27/2016  . Benign paroxysmal positional vertigo 10/31/2014  . Arthritis 04/27/2014  . Fatigue 06/10/2013  . Morbid obesity ([place]) 06/10/2013  . Asthma, chronic obstructive, with acute exacerbation ([place]) 06/10/2013  . Allergy 06/10/2013   Donato Heinz. Owens Shark PT  Norwood Levo 03/30/2019, 2:32 PM  [place], [place], [address] Phone: [phone]   Fax:  [phone]  Name: Jacqueline Good MRN: [id number] Date of Birth: [birth year]

## 2019-03-30 NOTE — Patient Instructions (Signed)
1. Continue venlafaxine 225 mg daily 2.Continue Abilify 2 mg  3.  Continue mirtazapine 15 mg at night  4.Continue Xanax 1 mg three times a day (and additional 0.5 mg daily) as needed for anxiety  4. Continue Ambien 10 mg at night as needed for insomnia 5. Next appointment: 10/15 at 11 AM

## 2019-03-31 ENCOUNTER — Telehealth: Payer: Self-pay | Admitting: *Deleted

## 2019-03-31 ENCOUNTER — Inpatient Hospital Stay: Payer: Medicaid Other

## 2019-03-31 ENCOUNTER — Encounter: Payer: Self-pay | Admitting: Adult Health

## 2019-03-31 ENCOUNTER — Inpatient Hospital Stay (HOSPITAL_BASED_OUTPATIENT_CLINIC_OR_DEPARTMENT_OTHER): Payer: Medicaid Other | Admitting: Adult Health

## 2019-03-31 ENCOUNTER — Other Ambulatory Visit: Payer: Medicaid Other

## 2019-03-31 ENCOUNTER — Inpatient Hospital Stay: Payer: Medicaid Other | Attending: Oncology

## 2019-03-31 ENCOUNTER — Other Ambulatory Visit: Payer: Self-pay

## 2019-03-31 ENCOUNTER — Other Ambulatory Visit: Payer: Self-pay | Admitting: Oncology

## 2019-03-31 VITALS — BP 98/61 | HR 77 | Temp 98.3°F | Resp 20 | Ht 65.0 in | Wt 222.0 lb

## 2019-03-31 DIAGNOSIS — Z5112 Encounter for antineoplastic immunotherapy: Secondary | ICD-10-CM | POA: Insufficient documentation

## 2019-03-31 DIAGNOSIS — J449 Chronic obstructive pulmonary disease, unspecified: Secondary | ICD-10-CM | POA: Insufficient documentation

## 2019-03-31 DIAGNOSIS — C50411 Malignant neoplasm of upper-outer quadrant of right female breast: Secondary | ICD-10-CM

## 2019-03-31 DIAGNOSIS — Z79899 Other long term (current) drug therapy: Secondary | ICD-10-CM | POA: Insufficient documentation

## 2019-03-31 DIAGNOSIS — J45901 Unspecified asthma with (acute) exacerbation: Secondary | ICD-10-CM

## 2019-03-31 DIAGNOSIS — Z1231 Encounter for screening mammogram for malignant neoplasm of breast: Secondary | ICD-10-CM

## 2019-03-31 DIAGNOSIS — Z171 Estrogen receptor negative status [ER-]: Secondary | ICD-10-CM

## 2019-03-31 DIAGNOSIS — J441 Chronic obstructive pulmonary disease with (acute) exacerbation: Secondary | ICD-10-CM

## 2019-03-31 DIAGNOSIS — Z87891 Personal history of nicotine dependence: Secondary | ICD-10-CM | POA: Insufficient documentation

## 2019-03-31 LAB — COMPREHENSIVE METABOLIC PANEL
ALT: 21 U/L (ref 0–44)
AST: 16 U/L (ref 15–41)
Albumin: 3.6 g/dL (ref 3.5–5.0)
Alkaline Phosphatase: 87 U/L (ref 38–126)
Anion gap: 11 (ref 5–15)
BUN: 20 mg/dL (ref 6–20)
CO2: 21 mmol/L — ABNORMAL LOW (ref 22–32)
Calcium: 8.9 mg/dL (ref 8.9–10.3)
Chloride: 106 mmol/L (ref 98–111)
Creatinine, Ser: 0.88 mg/dL (ref 0.44–1.00)
GFR calc Af Amer: >60 mL/min (ref >60)
GFR calc non Af Amer: >60 mL/min (ref >60)
Glucose, Bld: 107 mg/dL — ABNORMAL HIGH (ref 70–99)
Potassium: 4.1 mmol/L (ref 3.5–5.1)
Sodium: 138 mmol/L (ref 135–145)
Total Bilirubin: 0.4 mg/dL (ref 0.3–1.2)
Total Protein: 7 g/dL (ref 6.5–8.1)

## 2019-03-31 LAB — CBC WITH DIFFERENTIAL/PLATELET
Abs Immature Granulocytes: 0.03 10*3/uL (ref 0.00–0.07)
Basophils Absolute: 0 10*3/uL (ref 0.0–0.1)
Basophils Relative: 0 %
Eosinophils Absolute: 0.1 10*3/uL (ref 0.0–0.5)
Eosinophils Relative: 2 %
HCT: 33.3 % — ABNORMAL LOW (ref 36.0–46.0)
Hemoglobin: 10.8 g/dL — ABNORMAL LOW (ref 12.0–15.0)
Immature Granulocytes: 0 %
Lymphocytes Relative: 14 %
Lymphs Abs: 1 10*3/uL (ref 0.7–4.0)
MCH: 29 pg (ref 26.0–34.0)
MCHC: 32.4 g/dL (ref 30.0–36.0)
MCV: 89.5 fL (ref 80.0–100.0)
Monocytes Absolute: 0.4 10*3/uL (ref 0.1–1.0)
Monocytes Relative: 5 %
Neutro Abs: 5.9 10*3/uL (ref 1.7–7.7)
Neutrophils Relative %: 79 %
Platelets: 217 10*3/uL (ref 150–400)
RBC: 3.72 MIL/uL — ABNORMAL LOW (ref 3.87–5.11)
RDW: 14.8 % (ref 11.5–15.5)
WBC: 7.5 10*3/uL (ref 4.0–10.5)
nRBC: 0 % (ref 0.0–0.2)

## 2019-03-31 MED ORDER — ACETAMINOPHEN 325 MG PO TABS
ORAL_TABLET | ORAL | Status: AC
  Filled 2019-03-31: qty 2

## 2019-03-31 MED ORDER — TRASTUZUMAB CHEMO 150 MG IV SOLR
600.0000 mg | Freq: Once | INTRAVENOUS | Status: AC
  Administered 2019-03-31: 600 mg via INTRAVENOUS
  Filled 2019-03-31: qty 28.57

## 2019-03-31 MED ORDER — DIPHENHYDRAMINE HCL 25 MG PO CAPS
25.0000 mg | ORAL_CAPSULE | Freq: Once | ORAL | Status: AC
  Administered 2019-03-31: 09:00:00 25 mg via ORAL

## 2019-03-31 MED ORDER — ACETAMINOPHEN 325 MG PO TABS
650.0000 mg | ORAL_TABLET | Freq: Once | ORAL | Status: AC
  Administered 2019-03-31: 650 mg via ORAL

## 2019-03-31 MED ORDER — HEPARIN SOD (PORK) LOCK FLUSH 100 UNIT/ML IV SOLN
500.0000 [IU] | Freq: Once | INTRAVENOUS | Status: AC | PRN
  Administered 2019-03-31: 500 [IU]
  Filled 2019-03-31: qty 5

## 2019-03-31 MED ORDER — SODIUM CHLORIDE 0.9% FLUSH
10.0000 mL | INTRAVENOUS | Status: DC | PRN
  Administered 2019-03-31: 10 mL
  Filled 2019-03-31: qty 10

## 2019-03-31 MED ORDER — DIPHENHYDRAMINE HCL 25 MG PO CAPS
ORAL_CAPSULE | ORAL | Status: AC
  Filled 2019-03-31: qty 1

## 2019-03-31 MED ORDER — SODIUM CHLORIDE 0.9 % IV SOLN
Freq: Once | INTRAVENOUS | Status: AC
  Administered 2019-03-31: 09:00:00 via INTRAVENOUS
  Filled 2019-03-31: qty 250

## 2019-03-31 NOTE — Telephone Encounter (Signed)
Left vm congratulating pt on completing herceptin. Contact information provided for questions or needs.

## 2019-03-31 NOTE — Patient Instructions (Signed)
[facility] Discharge Instructions for Patients Receiving Chemotherapy  Today you received the following chemotherapy agents Trastuzumab (HERCEPTIN).  To help prevent nausea and vomiting after your treatment, we encourage you to take your nausea medication as prescribed.  If you develop nausea and vomiting that is not controlled by your nausea medication, call the clinic.   BELOW ARE SYMPTOMS THAT SHOULD BE REPORTED IMMEDIATELY:  *FEVER GREATER THAN 100.5 F  *CHILLS WITH OR WITHOUT FEVER  NAUSEA AND VOMITING THAT IS NOT CONTROLLED WITH YOUR NAUSEA MEDICATION  *UNUSUAL SHORTNESS OF BREATH  *UNUSUAL BRUISING OR BLEEDING  TENDERNESS IN MOUTH AND THROAT WITH OR WITHOUT PRESENCE OF ULCERS  *URINARY PROBLEMS  *BOWEL PROBLEMS  UNUSUAL RASH Items with * indicate a potential emergency and should be followed up as soon as possible.  Feel free to call the clinic should you have any questions or concerns. The clinic phone number is [phone].  Please show the CHEMO ALERT CARD at check-in to the Emergency Department and triage nurse.  Coronavirus (COVID-19) Are you at risk?  Are you at risk for the Coronavirus (COVID-19)?  To be considered HIGH RISK for Coronavirus (COVID-19), you have to meet the following criteria:  . Traveled to [place], [place], [place], [place] or [place]; or in the [place] to [place], [place], [place], or [place]; and have fever, cough, and shortness of breath within the last 2 weeks of travel OR . Been in close contact with a person diagnosed with COVID-19 within the last 2 weeks and have fever, cough, and shortness of breath . IF YOU DO NOT MEET THESE CRITERIA, YOU ARE CONSIDERED LOW RISK FOR COVID-19.  What to do if you are HIGH RISK for COVID-19?  . If you are having a medical emergency, call 911. . Seek medical care right away. Before you go to a doctor's office, urgent care or emergency department, call ahead and tell them  about your recent travel, contact with someone diagnosed with COVID-19, and your symptoms. You should receive instructions from your physician's office regarding next steps of care.  . When you arrive at healthcare provider, tell the healthcare staff immediately you have returned from visiting [place], [place], [place], [place] or [place]; or traveled in the [place] to [place], [place], [place], or [place]; in the last two weeks or you have been in close contact with a person diagnosed with COVID-19 in the last 2 weeks.   . Tell the health care staff about your symptoms: fever, cough and shortness of breath. . After you have been seen by a medical provider, you will be either: o Tested for (COVID-19) and discharged home on quarantine except to seek medical care if symptoms worsen, and asked to  - Stay home and avoid contact with others until you get your results (4-5 days)  - Avoid travel on public transportation if possible (such as bus, train, or airplane) or o Sent to the Emergency Department by EMS for evaluation, COVID-19 testing, and possible admission depending on your condition and test results.  What to do if you are LOW RISK for COVID-19?  Reduce your risk of any infection by using the same precautions used for avoiding the common cold or flu:  . Wash your hands often with soap and warm water for at least 20 seconds.  If soap and water are not readily available, use an alcohol-based hand sanitizer with at least 60% alcohol.  . If coughing or sneezing,   cover your mouth and nose by coughing or sneezing into the elbow areas of your shirt or coat, into a tissue or into your sleeve (not your hands). . Avoid shaking hands with others and consider head nods or verbal greetings only. . Avoid touching your eyes, nose, or mouth with unwashed hands.  . Avoid close contact with people who are sick. . Avoid places or events with large numbers of people in one location, like concerts or  sporting events. . Carefully consider travel plans you have or are making. . If you are planning any travel outside or inside the US, visit the CDC's Travelers' Health webpage for the latest health notices. . If you have some symptoms but not all symptoms, continue to monitor at home and seek medical attention if your symptoms worsen. . If you are having a medical emergency, call 911.   ADDITIONAL HEALTHCARE OPTIONS FOR PATIENTS  [facility] / e-Visit: [email or web address]         [facility]: [phone]  [facility]: [phone]                   [facility]: [phone]   
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
## 2019-03-31 NOTE — Progress Notes (Signed)
Per Suzan Garibaldi NP, appt for L breast mammo  Scheduled [facility] 8/31 @1130  am. Infusion nurse Kayla RN made pt aware of schedule appt.

## 2019-04-05 ENCOUNTER — Encounter: Payer: Self-pay | Admitting: Physical Therapy

## 2019-04-05 ENCOUNTER — Other Ambulatory Visit: Payer: Self-pay

## 2019-04-05 ENCOUNTER — Ambulatory Visit: Payer: Medicaid Other | Admitting: Physical Therapy

## 2019-04-05 DIAGNOSIS — M25611 Stiffness of right shoulder, not elsewhere classified: Secondary | ICD-10-CM | POA: Diagnosis not present

## 2019-04-05 DIAGNOSIS — G8929 Other chronic pain: Secondary | ICD-10-CM

## 2019-04-05 DIAGNOSIS — R293 Abnormal posture: Secondary | ICD-10-CM

## 2019-04-05 DIAGNOSIS — L599 Disorder of the skin and subcutaneous tissue related to radiation, unspecified: Secondary | ICD-10-CM

## 2019-04-05 DIAGNOSIS — M25511 Pain in right shoulder: Secondary | ICD-10-CM

## 2019-04-05 NOTE — Therapy (Signed)
[place], [place], [address] Phone: [phone]   Fax:  [phone]  Physical Therapy Treatment  Patient Details  Name: Jacqueline Good MRN: [id number] Date of Birth: September 16, 1960 Referring Provider (PT): [name]   Encounter Date: 04/05/2019  PT End of Session - 04/05/19 1024    Visit Number  14    Number of Visits  20    Date for PT Re-Evaluation  05/03/19    Authorization Type  Medicaid- orginally approved for 3 visits, just got approval for 12 more visits from 7/1 to 8/11, approved for 4 additional visits 03/22/19, approved til 9/22    Authorization - Visit Number  10    Authorization - Number of Visits  13    PT Start Time  0933    PT Stop Time  1016    PT Time Calculation (min)  43 min    Activity Tolerance  Patient tolerated treatment well    Behavior During Therapy  [facility] for tasks assessed/performed       Past Medical History:  Diagnosis Date  . Anemia   . Anxiety   . Arthritis    "all over" (08/12/2018)  . Asthma   . Breast cancer, right breast ([place]) dx'd 02/2018  . Chronic bronchitis ([place])   . Depression   . Family history of breast cancer   . Family history of prostate cancer   . Family history of uterine cancer   . GERD (gastroesophageal reflux disease)   . History of blood transfusion    "several; related to low blood" (08/12/2018)  . History of radiation therapy 11/15/18- 12/17/18   Right Chest wall, SCV, PAB. 25 fractions.     Past Surgical History:  Procedure Laterality Date  . AXILLARY LYMPH NODE DISSECTION Right 08/12/2018   Procedure: AXILLARY LYMPH NODE DISSECTION;  Surgeon: Alphonsa Overall, MD;  Location: [place];  Service: General;  Laterality: Right;  . BREAST BIOPSY Right 03/2018  . BREAST RECONSTRUCTION WITH PLACEMENT OF TISSUE EXPANDER AND FLEX HD (ACELLULAR HYDRATED DERMIS) Right 08/12/2018   Procedure: RIGHT BREAST RECONSTRUCTION WITH PLACEMENT OF TISSUE EXPANDER AND FLEX HD  (ACELLULAR HYDRATED DERMIS);  Surgeon: Wallace Going, DO;  Location: [place];  Service: Plastics;  Laterality: Right;  . DILATION AND CURETTAGE OF UTERUS    . ENDOMETRIAL ABLATION    . IR IMAGING GUIDED PORT INSERTION  03/18/2018  . MASTECTOMY MODIFIED RADICAL Right 08/12/2018   w/axillary LND  . MASTECTOMY MODIFIED RADICAL Right 08/12/2018   Procedure: RIGHT MODIFIED RADICAL MASTECTOMY;  Surgeon: Alphonsa Overall, MD;  Location: [place];  Service: General;  Laterality: Right;  . MYOMECTOMY    . REMOVAL OF TISSUE EXPANDER AND PLACEMENT OF IMPLANT Right 10/26/2018   Procedure: Removal of right breast expander;  Surgeon: Wallace Going, DO;  Location: [place];  Service: Plastics;  Laterality: Right;  75 min, please    There were no vitals filed for this visit.  Subjective Assessment - 04/05/19 0934    Subjective  I had my last chemo last week and it was rough but I am done now. If I had any chest spasms I can not remember them. I don't think I had any.    Pertinent History  Patient was diagnosed on 02/04/18 with right invasive ductal carcinoma breast cancer. It measures 3.1 cm and is located in the upper outer quadrant. It is ER/PR negative and HER2 positive with a Ki67 of 50%. She has a known  positive axillary lymph node. She has no other medical problems but does smoke 1 pack per day., 08/12/18- right mastectomy with ALND, 10/26/18- pt had expander removed because of infection, she has completed chemo and radiation    Patient Stated Goals  to get rid of these spasms    Currently in Pain?  No/denies    Pain Score  0-No pain                       [facility] Adult PT Treatment/Exercise - 04/05/19 0001      Manual Therapy   Soft tissue mobilization  in left sidelying to R posterior shoulder and serratus anterior in areas of muscle tightness that did release with massage    Myofascial Release  cross hands along pec muscle and also in right axilla to cording, numerous cords  can be  palpated in this area    Passive ROM  to Rt shoulder as tolerated in direction of flexion, abduction, and ER and D2                  PT Long Term Goals - 03/22/19 1033      PT LONG TERM GOAL #1   Title  Pt will report a 75% improvement in right chest pain to decrease discomfort caused by spasms.    Baseline  65% improvement reported at this time-02/01/19, 02/25/19- 75% improved    Time  4    Period  Weeks    Status  Achieved      PT LONG TERM GOAL #2   Title  Pt will be independent in a home exercise program for continued strengthening and stretching    Baseline  Pt is independent with initial HEP for AA/ROM, is notyet ready for strength due to tightness/spasms-02/01/19, 03/22/19- pt is now indep in strengthenign and stretching exercises    Time  4    Period  Weeks    Status  Achieved      PT LONG TERM GOAL #3   Title  Pt will report a 25% improvement in swelling in right chest to allow improved comfort.    Baseline  30% improvement reported at this time-02/01/19, 02/25/19- pt reports she is now having more swelling, it has worsened, 03/22/19- 15% improved    Time  4    Period  Weeks    Status  On-going      PT LONG TERM GOAL #4   Title  Pt will demonstrate 150 degrees of right shoulder flexion to allow her to reach overhead.    Baseline  116; 126 degrees- 02/01/19, 02/25/19- 127 degrees 03/22/19- R 151    Time  4    Period  Weeks    Status  Achieved      PT LONG TERM GOAL #5   Title  Pt will demonstrate 140 degrees of right shoulder abduction to allow her to reach out to the side.    Baseline  89; 106 degrees - 02/01/19, 02/25/19- 130, 03/22/19- 124    Time  4    Period  Weeks    Status  On-going            Plan - 04/05/19 1025    Clinical Impression Statement  Pt did not have any muscle spasms over the weekend. Today was able to palpate numerous cords in right axilla and performed myofascial release to these areas. Instructed pt on importance of stretching in to  discomfort to help decrease cording.  Contined with STM to serratus anterior and posterior shoulder in area of tightness and continued with PROM to R shoulder since pt is still pretty limited in abduction most likely due to cording.    Stability/Clinical Decision Making  Evolving/Moderate complexity    Rehab Potential  Good    PT Frequency  1x / week    PT Duration  --   5 weeks   PT Treatment/Interventions  ADLs/Self Care Home Management;Therapeutic exercise;Patient/family education;Orthotic Fit/Training;Manual techniques;Manual lymph drainage;Scar mobilization;Passive range of motion;Taping;Joint Manipulations    PT Next Visit Plan  myofascial cording R axilla, assess indep with self MLD, Cont gentle P/ROM of Rt shoulder, and STM to Rt chest and scar massage; continue with AA/ROM including pulleys and try ball up wall next    PT Home Exercise Plan  supine dowel ex, behind the head stretch, horizontal abduction stretch; supine scapular series (isuued red but pt to cont with yellow until easier)    Consulted and Agree with Plan of Care  Patient       Patient will benefit from skilled therapeutic intervention in order to improve the following deficits and impairments:  Decreased knowledge of precautions, Impaired UE functional use, Decreased range of motion, Postural dysfunction, Pain, Decreased scar mobility, Increased fascial restricitons, Decreased strength, Increased edema  Visit Diagnosis: Stiffness of right shoulder, not elsewhere classified  Chronic right shoulder pain  Disorder of the skin and subcutaneous tissue related to radiation, unspecified  Abnormal posture     Problem List Patient Active Problem List   Diagnosis Date Noted  . Genetic testing 10/01/2018  . Family history of breast cancer   . Family history of uterine cancer   . Family history of prostate cancer   . S/P mastectomy, right 08/20/2018  . Acquired absence of right breast 08/20/2018  . Port-A-Cath in place  06/21/2018  . MDD (major depressive disorder), recurrent episode, moderate ([place]) 06/13/2018  . Generalized anxiety disorder 06/13/2018  . OCD (obsessive compulsive disorder) 06/13/2018  . Insomnia 06/13/2018  . Malignant neoplasm of upper-outer quadrant of right breast in female, estrogen receptor negative ([place]) 03/02/2018  . Needs flu shot 06/03/2017  . Chronic bilateral low back pain without sciatica 06/03/2017  . Adjustment disorder with anxious mood 03/27/2016  . Benign paroxysmal positional vertigo 10/31/2014  . Arthritis 04/27/2014  . Fatigue 06/10/2013  . Morbid obesity ([place]) 06/10/2013  . Asthma, chronic obstructive, with acute exacerbation ([place]) 06/10/2013  . Allergy 06/10/2013    Allyson Sabal [facility] 04/05/2019, 10:28 AM  [place], [place], [address] Phone: [phone]   Fax:  [phone]  Name: Jacqueline Good MRN: [id number] Date of Birth: [birth year]  Manus Gunning, PT 04/05/19 10:28 AM

## 2019-04-11 ENCOUNTER — Ambulatory Visit: Payer: Medicaid Other

## 2019-04-12 ENCOUNTER — Ambulatory Visit: Payer: Medicaid Other

## 2019-04-13 ENCOUNTER — Other Ambulatory Visit: Payer: Self-pay

## 2019-04-13 ENCOUNTER — Ambulatory Visit
Admission: RE | Admit: 2019-04-13 | Discharge: 2019-04-13 | Disposition: A | Discharge status: Home / Self Care | Payer: Medicaid Other | Source: Ambulatory Visit | Attending: Oncology | Admitting: Oncology

## 2019-04-13 DIAGNOSIS — Z1231 Encounter for screening mammogram for malignant neoplasm of breast: Secondary | ICD-10-CM

## 2019-04-20 ENCOUNTER — Other Ambulatory Visit: Payer: Self-pay

## 2019-04-20 ENCOUNTER — Ambulatory Visit: Payer: Medicaid Other | Attending: Oncology

## 2019-04-20 DIAGNOSIS — L599 Disorder of the skin and subcutaneous tissue related to radiation, unspecified: Secondary | ICD-10-CM

## 2019-04-20 DIAGNOSIS — M25611 Stiffness of right shoulder, not elsewhere classified: Secondary | ICD-10-CM | POA: Diagnosis present

## 2019-04-20 DIAGNOSIS — R293 Abnormal posture: Secondary | ICD-10-CM | POA: Insufficient documentation

## 2019-04-20 DIAGNOSIS — G8929 Other chronic pain: Secondary | ICD-10-CM | POA: Diagnosis present

## 2019-04-20 DIAGNOSIS — I972 Postmastectomy lymphedema syndrome: Secondary | ICD-10-CM | POA: Diagnosis present

## 2019-04-20 DIAGNOSIS — M25511 Pain in right shoulder: Secondary | ICD-10-CM | POA: Diagnosis present

## 2019-04-20 NOTE — Therapy (Signed)
[place], [place], [address] Phone: [phone]   Fax:  [phone]  Physical Therapy Treatment  Patient Details  Name: LOURDES KUCHARSKI MRN: [id number] Date of Birth: 20-Nov-1960 Referring Provider (PT): [name]   Encounter Date: 04/20/2019  PT End of Session - 04/20/19 1158    Visit Number  15    Number of Visits  20    Date for PT Re-Evaluation  05/03/19    Authorization Type  Medicaid- orginally approved for 3 visits, just got approval for 12 more visits from 7/1 to 8/11, approved for 4 additional visits 03/22/19, approved til 9/22    Authorization - Visit Number  11    Authorization - Number of Visits  13    PT Start Time  1104    PT Stop Time  1153    PT Time Calculation (min)  49 min    Activity Tolerance  Patient tolerated treatment well    Behavior During Therapy  [facility] for tasks assessed/performed       Past Medical History:  Diagnosis Date  . Anemia   . Anxiety   . Arthritis    "all over" (08/12/2018)  . Asthma   . Breast cancer, right breast ([place]) dx'd 02/2018  . Chronic bronchitis ([place])   . Depression   . Family history of breast cancer   . Family history of prostate cancer   . Family history of uterine cancer   . GERD (gastroesophageal reflux disease)   . History of blood transfusion    "several; related to low blood" (08/12/2018)  . History of radiation therapy 11/15/18- 12/17/18   Right Chest wall, SCV, PAB. 25 fractions.     Past Surgical History:  Procedure Laterality Date  . AXILLARY LYMPH NODE DISSECTION Right 08/12/2018   Procedure: AXILLARY LYMPH NODE DISSECTION;  Surgeon: Alphonsa Overall, MD;  Location: [place];  Service: General;  Laterality: Right;  . BREAST BIOPSY Right 03/2018  . BREAST RECONSTRUCTION WITH PLACEMENT OF TISSUE EXPANDER AND FLEX HD (ACELLULAR HYDRATED DERMIS) Right 08/12/2018   Procedure: RIGHT BREAST RECONSTRUCTION WITH PLACEMENT OF TISSUE EXPANDER AND FLEX HD  (ACELLULAR HYDRATED DERMIS);  Surgeon: Wallace Going, DO;  Location: [place];  Service: Plastics;  Laterality: Right;  . DILATION AND CURETTAGE OF UTERUS    . ENDOMETRIAL ABLATION    . IR IMAGING GUIDED PORT INSERTION  03/18/2018  . MASTECTOMY MODIFIED RADICAL Right 08/12/2018   w/axillary LND  . MASTECTOMY MODIFIED RADICAL Right 08/12/2018   Procedure: RIGHT MODIFIED RADICAL MASTECTOMY;  Surgeon: Alphonsa Overall, MD;  Location: [place];  Service: General;  Laterality: Right;  . MYOMECTOMY    . REMOVAL OF TISSUE EXPANDER AND PLACEMENT OF IMPLANT Right 10/26/2018   Procedure: Removal of right breast expander;  Surgeon: Wallace Going, DO;  Location: [place];  Service: Plastics;  Laterality: Right;  75 min, please    There were no vitals filed for this visit.  Subjective Assessment - 04/20/19 1106    Subjective  I've decided I'm going to visit my dad in [place] leaving this weekend and won't return until 9/27. I'd like to resume therapy if Medicaid will allow. My chest spasms are still behaving whihc is good. I'm still having some fatigue and a little nasuosness since finishing chemo, but nothing I haven't been able to manage. Also some achiness in my legs that is pretty persistent, but that's been since the beginning of the HER2 treatment.  Pertinent History  Patient was diagnosed on 02/04/18 with right invasive ductal carcinoma breast cancer. It measures 3.1 cm and is located in the upper outer quadrant. It is ER/PR negative and HER2 positive with a Ki67 of 50%. She has a known positive axillary lymph node. She has no other medical problems but does smoke 1 pack per day., 08/12/18- right mastectomy with ALND, 10/26/18- pt had expander removed because of infection, she has completed chemo and radiation    Patient Stated Goals  to get rid of these spasms    Currently in Pain?  No/denies         [facility] PT Assessment - 04/20/19 0001      AROM   Right Shoulder Flexion  154 Degrees     Right Shoulder ABduction  139 Degrees    Right Shoulder Internal Rotation  60 Degrees    Right Shoulder External Rotation  86 Degrees                   [facility] Adult PT Treatment/Exercise - 04/20/19 0001      Shoulder Exercises: Pulleys   Flexion  2 minutes    ABduction  2 minutes    ABduction Limitations  VCs to remind pt to relax shoulders throughout      Shoulder Exercises: Therapy Ball   Flexion  Both;5 reps   forward lean into end of stretch   ABduction  Right;5 reps   same side lean into end of stretch     Manual Therapy   Soft tissue mobilization  in left sidelying to Rt posterior shoulder and serratus anterior in areas of muscle tightness that did release with massage    Myofascial Release  cross hands along pec muscle and also in right axilla to cording, numerous cords  can be palpated in this area    Passive ROM  to Rt shoulder as tolerated in direction of flexion, abduction, and ER and D2                  PT Long Term Goals - 04/20/19 1113      PT LONG TERM GOAL #1   Title  Pt will report a 75% improvement in right chest pain to decrease discomfort caused by spasms.    Baseline  65% improvement reported at this time-02/01/19, 02/25/19- 75% improved; 80% improved at this time-04/20/19    Status  Achieved      PT LONG TERM GOAL #2   Title  Pt will be independent in a home exercise program for continued strengthening and stretching    Baseline  Pt is independent with initial HEP for AA/ROM, is notyet ready for strength due to tightness/spasms-02/01/19, 03/22/19- pt is now indep in strengthenign and stretching exercises; pt reports doing HEP for strength every other day and stretching daily-04/20/19    Status  Achieved      PT LONG TERM GOAL #3   Title  Pt will report a 25% improvement in swelling in right chest to allow improved comfort.    Baseline  30% improvement reported at this time-02/01/19, 02/25/19- pt reports she is now having more swelling, it has  worsened, 03/22/19- 15% improved; 40% improvment at this time-04/20/19    Status  Achieved      PT LONG TERM GOAL #4   Title  Pt will demonstrate 150 degrees of right shoulder flexion to allow her to reach overhead.    Baseline  116; 126 degrees- 02/01/19, 02/25/19- 127 degrees 03/22/19-  R 151; Rt flex 154 degrees-04/20/19    Status  Achieved      PT LONG TERM GOAL #5   Title  Pt will demonstrate 140 degrees of right shoulder abduction to allow her to reach out to the side.    Baseline  89; 106 degrees - 02/01/19, 02/25/19- 130, 03/22/19- 124; Rt abd 139 degrees-04/20/19    Status  Partially Met            Plan - 04/20/19 1201    Clinical Impression Statement  Pt has progressed overall with her goals of improving A/ROM of Rt shoulder, and decreasing her chest swelling and spasms. She is still however limited with her end A/and P/ROM due to tissue tightness and cording that is palpable in Rt axilla. Pt would benefit from continued therapy to help her regain her end ROM which will allow her to better tolerate her upcoming reconstruction surgery near the beginning of the year.    Personal Factors and Comorbidities  Comorbidity 1;Comorbidity 2;Other    Comorbidities  depression, anxiety, lives alone    Examination-Activity Limitations  Reach Overhead;Carry    Examination-Participation Restrictions  Cleaning    Stability/Clinical Decision Making  Evolving/Moderate complexity    Rehab Potential  Good    Clinical Impairments Affecting Rehab Potential  hx of radiation, hx of infections in right chest    PT Frequency  1x / week    PT Duration  Other (comment)   5 weeks   PT Treatment/Interventions  ADLs/Self Care Home Management;Therapeutic exercise;Patient/family education;Orthotic Fit/Training;Manual techniques;Manual lymph drainage;Scar mobilization;Passive range of motion;Taping;Joint Manipulations    PT Next Visit Plan  Pt will need renewal if she returns after visiting her father in [place].  Cont to need myofascial release to cording in Rt axilla, assess for independence with self MLD and gentle P/ROM to Rt shoulder as tolerated, also STM to Rt chest and scar massage.    PT Home Exercise Plan  supine dowel ex, behind the head stretch, horizontal abduction stretch; supine scapular series (isuued red but pt to cont with yellow until easier)    Consulted and Agree with Plan of Care  Patient       Patient will benefit from skilled therapeutic intervention in order to improve the following deficits and impairments:  Decreased knowledge of precautions, Impaired UE functional use, Decreased range of motion, Postural dysfunction, Pain, Decreased scar mobility, Increased fascial restricitons, Decreased strength, Increased edema  Visit Diagnosis: Stiffness of right shoulder, not elsewhere classified  Chronic right shoulder pain  Disorder of the skin and subcutaneous tissue related to radiation, unspecified  Abnormal posture  Postmastectomy lymphedema     Problem List Patient Active Problem List   Diagnosis Date Noted  . Genetic testing 10/01/2018  . Family history of breast cancer   . Family history of uterine cancer   . Family history of prostate cancer   . S/P mastectomy, right 08/20/2018  . Acquired absence of right breast 08/20/2018  . Port-A-Cath in place 06/21/2018  . MDD (major depressive disorder), recurrent episode, moderate ([place]) 06/13/2018  . Generalized anxiety disorder 06/13/2018  . OCD (obsessive compulsive disorder) 06/13/2018  . Insomnia 06/13/2018  . Malignant neoplasm of upper-outer quadrant of right breast in female, estrogen receptor negative ([place]) 03/02/2018  . Needs flu shot 06/03/2017  . Chronic bilateral low back pain without sciatica 06/03/2017  . Adjustment disorder with anxious mood 03/27/2016  . Benign paroxysmal positional vertigo 10/31/2014  . Arthritis 04/27/2014  . Fatigue 06/10/2013  .  Morbid obesity ([place]) 06/10/2013  . Asthma, chronic  obstructive, with acute exacerbation ([place]) 06/10/2013  . Allergy 06/10/2013    Otelia Limes, PTA 04/20/2019, 12:06 PM  [place], [place], [address] Phone: [phone]   Fax:  [phone]  Name: JINAN BIGGINS MRN: [id number] Date of Birth: [birth year]

## 2019-04-28 MED FILL — MIRTAZAPINE 15 MG TABLET: 15 | 30 days supply | Qty: 30 | Fill #1

## 2019-04-28 MED FILL — VENLAFAXINE HCL 75 MG TAB: 75 | 90 days supply | Qty: 270 | Fill #0

## 2019-04-28 MED FILL — ARIPIPRAZOLE 2 MG TABS: 2 | 90 days supply | Qty: 90 | Fill #0

## 2019-04-29 MED FILL — ZOLPIDEM TARTRATE 10 MG TAB: 10 | 30 days supply | Qty: 30 | Fill #0

## 2019-04-29 MED FILL — ALPRAZolam 1 MG TABS: 1 | 30 days supply | Qty: 105 | Fill #0

## 2019-05-03 ENCOUNTER — Ambulatory Visit: Payer: Medicaid Other

## 2019-05-05 ENCOUNTER — Encounter (HOSPITAL_COMMUNITY): Payer: Self-pay | Admitting: *Deleted

## 2019-05-10 ENCOUNTER — Ambulatory Visit: Payer: Medicaid Other | Admitting: Physical Therapy

## 2019-05-12 ENCOUNTER — Ambulatory Visit: Payer: Medicaid Other

## 2019-05-12 MED FILL — tiZANidine HCL 4 MG TABS: 4 | 30 days supply | Qty: 30 | Fill #2

## 2019-05-12 MED FILL — NAPROXEN 500 MG TABS: 500 | 30 days supply | Qty: 60 | Fill #3

## 2019-05-13 ENCOUNTER — Telehealth: Payer: Self-pay | Admitting: Adult Health

## 2019-05-13 NOTE — Telephone Encounter (Signed)
I left a message regarding reschedule from 10/26 to 11/3
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
## 2019-05-19 ENCOUNTER — Other Ambulatory Visit: Payer: Self-pay

## 2019-05-19 ENCOUNTER — Ambulatory Visit: Payer: Medicaid Other | Attending: Oncology | Admitting: Rehabilitation

## 2019-05-19 ENCOUNTER — Encounter: Payer: Self-pay | Admitting: Rehabilitation

## 2019-05-19 DIAGNOSIS — M25511 Pain in right shoulder: Secondary | ICD-10-CM | POA: Diagnosis present

## 2019-05-19 DIAGNOSIS — L599 Disorder of the skin and subcutaneous tissue related to radiation, unspecified: Secondary | ICD-10-CM | POA: Diagnosis present

## 2019-05-19 DIAGNOSIS — C50411 Malignant neoplasm of upper-outer quadrant of right female breast: Secondary | ICD-10-CM | POA: Diagnosis present

## 2019-05-19 DIAGNOSIS — Z171 Estrogen receptor negative status [ER-]: Secondary | ICD-10-CM | POA: Insufficient documentation

## 2019-05-19 DIAGNOSIS — G8929 Other chronic pain: Secondary | ICD-10-CM | POA: Diagnosis present

## 2019-05-19 DIAGNOSIS — R293 Abnormal posture: Secondary | ICD-10-CM

## 2019-05-19 DIAGNOSIS — M25611 Stiffness of right shoulder, not elsewhere classified: Secondary | ICD-10-CM | POA: Diagnosis present

## 2019-05-19 DIAGNOSIS — I972 Postmastectomy lymphedema syndrome: Secondary | ICD-10-CM | POA: Diagnosis present

## 2019-05-19 NOTE — Therapy (Signed)
[place], [place], [address] Phone: [phone]   Fax:  [phone]  Physical Therapy Treatment  Patient Details  Name: Jacqueline Good MRN: [id number] Date of Birth: June 27, 1961 Referring Provider (PT): [name]   Encounter Date: 05/19/2019  PT End of Session - 05/19/19 1308    Visit Number  21    Number of Visits  26    Date for PT Re-Evaluation  06/20/19    Authorization Type  Medicaid- orginally approved for 3 visits, just got approval for 12 more visits from 7/1 to 8/11, approved for 4 additional visits 03/22/19, approved til 9/22, approved for 6 additional visits 05/10/19-06/20/19, approved again 6 visits from 9/29-11/9    Authorization - Visit Number  1    Authorization - Number of Visits  6    PT Start Time  7672    PT Stop Time  1353    PT Time Calculation (min)  48 min    Activity Tolerance  Patient tolerated treatment well    Behavior During Therapy  [facility] for tasks assessed/performed       Past Medical History:  Diagnosis Date  . Anemia   . Anxiety   . Arthritis    "all over" (08/12/2018)  . Asthma   . Breast cancer, right breast ([place]) dx'd 02/2018  . Chronic bronchitis ([place])   . Depression   . Family history of breast cancer   . Family history of prostate cancer   . Family history of uterine cancer   . GERD (gastroesophageal reflux disease)   . History of blood transfusion    "several; related to low blood" (08/12/2018)  . History of radiation therapy 11/15/18- 12/17/18   Right Chest wall, SCV, PAB. 25 fractions.     Past Surgical History:  Procedure Laterality Date  . AXILLARY LYMPH NODE DISSECTION Right 08/12/2018   Procedure: AXILLARY LYMPH NODE DISSECTION;  Surgeon: Alphonsa Overall, MD;  Location: [place];  Service: General;  Laterality: Right;  . BREAST BIOPSY Right 03/2018  . BREAST RECONSTRUCTION WITH PLACEMENT OF TISSUE EXPANDER AND FLEX HD (ACELLULAR HYDRATED DERMIS) Right 08/12/2018    Procedure: RIGHT BREAST RECONSTRUCTION WITH PLACEMENT OF TISSUE EXPANDER AND FLEX HD (ACELLULAR HYDRATED DERMIS);  Surgeon: Wallace Going, DO;  Location: [place];  Service: Plastics;  Laterality: Right;  . DILATION AND CURETTAGE OF UTERUS    . ENDOMETRIAL ABLATION    . IR IMAGING GUIDED PORT INSERTION  03/18/2018  . MASTECTOMY MODIFIED RADICAL Right 08/12/2018   w/axillary LND  . MASTECTOMY MODIFIED RADICAL Right 08/12/2018   Procedure: RIGHT MODIFIED RADICAL MASTECTOMY;  Surgeon: Alphonsa Overall, MD;  Location: [place];  Service: General;  Laterality: Right;  . MYOMECTOMY    . REMOVAL OF TISSUE EXPANDER AND PLACEMENT OF IMPLANT Right 10/26/2018   Procedure: Removal of right breast expander;  Surgeon: Wallace Going, DO;  Location: [place];  Service: Plastics;  Laterality: Right;  75 min, please    There were no vitals filed for this visit.  Subjective Assessment - 05/19/19 1306    Subjective  I did have some muscle spasms when I was gone the past month maybe 2x per week lasting 10-81mn each time the front of the chest. I didn't do much stretching    Pertinent History  Patient was diagnosed on 02/04/18 with right invasive ductal carcinoma breast cancer. It measures 3.1 cm and is located in the upper outer quadrant. It is ER/PR negative and HER2  positive with a Ki67 of 50%. She has a known positive axillary lymph node. She has no other medical problems but does smoke 1 pack per day., 08/12/18- right mastectomy with ALND, 10/26/18- pt had expander removed because of infection, she has completed chemo and radiation    Patient Stated Goals  to get rid of these spasms    Currently in Pain?  No/denies         [facility] PT Assessment - 05/19/19 0001      AROM   Right Shoulder Flexion  135 Degrees   top of the shoulder pain   Right Shoulder ABduction  123 Degrees   I feel that in my chest   Right Shoulder Internal Rotation  65 Degrees    Right Shoulder External Rotation  96 Degrees    Right  Shoulder Horizontal ABduction  5 Degrees   in the chest   Left Shoulder Horizontal ABduction  27 Degrees                   [facility] Adult PT Treatment/Exercise - 05/19/19 0001      Manual Therapy   Myofascial Release  to the left inicision and chest    Manual Lymphatic Drainage (MLD)  in supine: short neck avoiding port, left axillary nodes and interaxillary pathway, right inguinal nodes and establishment of axillo inguinal pathway then Rt chest and lateral trunk moving fluid towards pathway; focus on Lateral chest wall    Passive ROM  to Rt shoulder as tolerated in direction of flexion, abduction, and ER and D2                  PT Long Term Goals - 05/19/19 1314      PT LONG TERM GOAL #1   Title  Pt will report a 75% improvement in right chest pain to decrease discomfort caused by spasms.    Baseline  65% improvement reported at this time-02/01/19, 02/25/19- 75% improved; 80% improved at this time-04/20/19, 70% on 10/8 after break    Status  Partially Met      PT LONG TERM GOAL #2   Title  Pt will be independent in a home exercise program for continued strengthening and stretching    Baseline  Pt is independent with initial HEP for AA/ROM, is notyet ready for strength due to tightness/spasms-02/01/19, 03/22/19- pt is now indep in strengthenign and stretching exercises; pt reports doing HEP for strength every other day and stretching daily-04/20/19, streching program only 05/19/19    Status  Partially Met      PT LONG TERM GOAL #3   Title  Pt will report a 25% improvement in swelling in right chest to allow improved comfort.    Baseline  30% improvement reported at this time-02/01/19, 02/25/19- pt reports she is now having more swelling, it has worsened, 03/22/19- 15% improved; 40% improvment at this time-04/20/19, 40% on 05/19/19    Status  Achieved      PT LONG TERM GOAL #4   Title  Pt will demonstrate 150 degrees of right shoulder flexion to allow her to reach overhead.     Baseline  116; 126 degrees- 02/01/19, 02/25/19- 127 degrees 03/22/19- R 151; Rt flex 154 degrees-04/20/19; R flex: 135 on 05/19/19    Status  Partially Met      PT LONG TERM GOAL #5   Title  Pt will demonstrate 140 degrees of right shoulder abduction to allow her to reach out to the side.  Baseline  89; 106 degrees - 02/01/19, 02/25/19- 130, 03/22/19- 124; Rt abd 139 degrees-04/20/19, 123 on 10/8    Status  On-going            Plan - 05/19/19 1605    Clinical Impression Statement  Pt returns after 1 month break from PT due to visiting family with increased Rt shoulder stiffness and decrease in AROM measurements globally, return of spasms in the right pectoralis medially and increased swelling at the lateral chest per pt.  Assessed goals and extended POC with medicaid approving visits until 11/9.  Will restart visits again to work towards current goals.    Personal Factors and Comorbidities  Comorbidity 1;Comorbidity 2;Other    Comorbidities  depression, anxiety, lives alone    PT Frequency  1x / week    PT Treatment/Interventions  ADLs/Self Care Home Management;Therapeutic exercise;Patient/family education;Orthotic Fit/Training;Manual techniques;Manual lymph drainage;Scar mobilization;Passive range of motion;Taping;Joint Manipulations    PT Next Visit Plan  Cont to need myofascial release to Rt axilla and chest, , assess for independence with self MLD and gentle P/ROM to Rt shoulder as tolerated,    PT Home Exercise Plan  doorway stretch, standing scapular series yellow band, wall walk horizontally    Consulted and Agree with Plan of Care  Patient       Patient will benefit from skilled therapeutic intervention in order to improve the following deficits and impairments:  Decreased knowledge of precautions, Impaired UE functional use, Decreased range of motion, Postural dysfunction, Pain, Decreased scar mobility, Increased fascial restricitons, Decreased strength, Increased edema  Visit  Diagnosis: Stiffness of right shoulder, not elsewhere classified  Chronic right shoulder pain  Disorder of the skin and subcutaneous tissue related to radiation, unspecified  Abnormal posture  Postmastectomy lymphedema  Malignant neoplasm of upper-outer quadrant of right breast in female, estrogen receptor negative ([place])     Problem List Patient Active Problem List   Diagnosis Date Noted  . Genetic testing 10/01/2018  . Family history of breast cancer   . Family history of uterine cancer   . Family history of prostate cancer   . S/P mastectomy, right 08/20/2018  . Acquired absence of right breast 08/20/2018  . Port-A-Cath in place 06/21/2018  . MDD (major depressive disorder), recurrent episode, moderate ([place]) 06/13/2018  . Generalized anxiety disorder 06/13/2018  . OCD (obsessive compulsive disorder) 06/13/2018  . Insomnia 06/13/2018  . Malignant neoplasm of upper-outer quadrant of right breast in female, estrogen receptor negative ([place]) 03/02/2018  . Needs flu shot 06/03/2017  . Chronic bilateral low back pain without sciatica 06/03/2017  . Adjustment disorder with anxious mood 03/27/2016  . Benign paroxysmal positional vertigo 10/31/2014  . Arthritis 04/27/2014  . Fatigue 06/10/2013  . Morbid obesity ([place]) 06/10/2013  . Asthma, chronic obstructive, with acute exacerbation ([place]) 06/10/2013  . Allergy 06/10/2013    Stark Bray 05/19/2019, 4:09 PM  [place], [place], [address] Phone: [phone]   Fax:  [phone]  Name: Jacqueline Good MRN: [id number] Date of Birth: September 10, 1960

## 2019-05-20 ENCOUNTER — Ambulatory Visit: Payer: Medicaid Other

## 2019-05-23 ENCOUNTER — Encounter: Payer: Self-pay | Admitting: Physical Therapy

## 2019-05-23 ENCOUNTER — Other Ambulatory Visit: Payer: Self-pay

## 2019-05-23 ENCOUNTER — Ambulatory Visit: Payer: Medicaid Other | Admitting: Physical Therapy

## 2019-05-23 DIAGNOSIS — R293 Abnormal posture: Secondary | ICD-10-CM

## 2019-05-23 DIAGNOSIS — M25611 Stiffness of right shoulder, not elsewhere classified: Secondary | ICD-10-CM | POA: Diagnosis not present

## 2019-05-23 DIAGNOSIS — L599 Disorder of the skin and subcutaneous tissue related to radiation, unspecified: Secondary | ICD-10-CM

## 2019-05-23 DIAGNOSIS — G8929 Other chronic pain: Secondary | ICD-10-CM

## 2019-05-23 NOTE — Therapy (Signed)
[place], [place], [address] Phone: [phone]   Fax:  [phone]  Physical Therapy Treatment  Patient Details  Name: Jacqueline Good MRN: [id number] Date of Birth: Dec 26, 1960 Referring Provider (PT): [name]   Encounter Date: 05/23/2019  PT End of Session - 05/23/19 1107    Visit Number  22    Number of Visits  26    Date for PT Re-Evaluation  06/20/19    PT Start Time  0903    PT Stop Time  0950    PT Time Calculation (min)  47 min    Activity Tolerance  Patient tolerated treatment well    Behavior During Therapy  [facility] for tasks assessed/performed       Past Medical History:  Diagnosis Date  . Anemia   . Anxiety   . Arthritis    "all over" (08/12/2018)  . Asthma   . Breast cancer, right breast ([facility]) dx'd 02/2018  . Chronic bronchitis ([place])   . Depression   . Family history of breast cancer   . Family history of prostate cancer   . Family history of uterine cancer   . GERD (gastroesophageal reflux disease)   . History of blood transfusion    "several; related to low blood" (08/12/2018)  . History of radiation therapy 11/15/18- 12/17/18   Right Chest wall, SCV, PAB. 25 fractions.     Past Surgical History:  Procedure Laterality Date  . AXILLARY LYMPH NODE DISSECTION Right 08/12/2018   Procedure: AXILLARY LYMPH NODE DISSECTION;  Surgeon: Alphonsa Overall, MD;  Location: [place];  Service: General;  Laterality: Right;  . BREAST BIOPSY Right 03/2018  . BREAST RECONSTRUCTION WITH PLACEMENT OF TISSUE EXPANDER AND FLEX HD (ACELLULAR HYDRATED DERMIS) Right 08/12/2018   Procedure: RIGHT BREAST RECONSTRUCTION WITH PLACEMENT OF TISSUE EXPANDER AND FLEX HD (ACELLULAR HYDRATED DERMIS);  Surgeon: Wallace Going, DO;  Location: [place];  Service: Plastics;  Laterality: Right;  . DILATION AND CURETTAGE OF UTERUS    . ENDOMETRIAL ABLATION    . IR IMAGING GUIDED PORT INSERTION  03/18/2018  . MASTECTOMY MODIFIED  RADICAL Right 08/12/2018   w/axillary LND  . MASTECTOMY MODIFIED RADICAL Right 08/12/2018   Procedure: RIGHT MODIFIED RADICAL MASTECTOMY;  Surgeon: Alphonsa Overall, MD;  Location: [place];  Service: General;  Laterality: Right;  . MYOMECTOMY    . REMOVAL OF TISSUE EXPANDER AND PLACEMENT OF IMPLANT Right 10/26/2018   Procedure: Removal of right breast expander;  Surgeon: Wallace Going, DO;  Location: [facility];  Service: Plastics;  Laterality: Right;  75 min, please    There were no vitals filed for this visit.  Subjective Assessment - 05/23/19 0903    Subjective  I think my shoulder got a little bit stiffer.    Pertinent History  Patient was diagnosed on 02/04/18 with right invasive ductal carcinoma breast cancer. It measures 3.1 cm and is located in the upper outer quadrant. It is ER/PR negative and HER2 positive with a Ki67 of 50%. She has a known positive axillary lymph node. She has no other medical problems but does smoke 1 pack per day., 08/12/18- right mastectomy with ALND, 10/26/18- pt had expander removed because of infection, she has completed chemo and radiation    Currently in Pain?  No/denies   but pt winces when she tries to move  [facility] Adult PT Treatment/Exercise - 05/23/19 0001      Shoulder Exercises: Supine   Protraction  AROM;Right;Left;10 reps      Shoulder Exercises: Seated   Other Seated Exercises  neck and scapular ROM,    20 trunk twist to left allowed increased right shoulder abd     Shoulder Exercises: Sidelying   External Rotation  AROM;Right    External Rotation Limitations  also did 10 reps of isometric external rotation     ABduction  AROM;Right   also did 10 reps of right shoulder isometric abduction    Other Sidelying Exercises  small circles with hand pointed to ceiling       Shoulder Exercises: Pulleys   Flexion  2 minutes    ABduction  2 minutes      Shoulder Exercises: ROM/Strengthening   Ball on Wall  10 reps with  yellow ball up the wall and       Manual Therapy   Soft tissue mobilization  prolonged pressur to tright trigger points at right lats and also maintained pressure while pt did small circles in each direction                   PT Long Term Goals - 05/19/19 1314      PT LONG TERM GOAL #1   Title  Pt will report a 75% improvement in right chest pain to decrease discomfort caused by spasms.    Baseline  65% improvement reported at this time-02/01/19, 02/25/19- 75% improved; 80% improved at this time-04/20/19, 70% on 10/8 after break    Status  Partially Met      PT LONG TERM GOAL #2   Title  Pt will be independent in a home exercise program for continued strengthening and stretching    Baseline  Pt is independent with initial HEP for AA/ROM, is notyet ready for strength due to tightness/spasms-02/01/19, 03/22/19- pt is now indep in strengthenign and stretching exercises; pt reports doing HEP for strength every other day and stretching daily-04/20/19, streching program only 05/19/19    Status  Partially Met      PT LONG TERM GOAL #3   Title  Pt will report a 25% improvement in swelling in right chest to allow improved comfort.    Baseline  30% improvement reported at this time-02/01/19, 02/25/19- pt reports she is now having more swelling, it has worsened, 03/22/19- 15% improved; 40% improvment at this time-04/20/19, 40% on 05/19/19    Status  Achieved      PT LONG TERM GOAL #4   Title  Pt will demonstrate 150 degrees of right shoulder flexion to allow her to reach overhead.    Baseline  116; 126 degrees- 02/01/19, 02/25/19- 127 degrees 03/22/19- R 151; Rt flex 154 degrees-04/20/19; R flex: 135 on 05/19/19    Status  Partially Met      PT LONG TERM GOAL #5   Title  Pt will demonstrate 140 degrees of right shoulder abduction to allow her to reach out to the side.    Baseline  89; 106 degrees - 02/01/19, 02/25/19- 130, 03/22/19- 124; Rt abd 139 degrees-04/20/19, 123 on 10/8    Status  On-going             Plan - 05/23/19 1107    Clinical Impression Statement  Pt continues to be very tight with trigger points around axilla and front and back of chest.  Worked on active movment as well as soft tissue work  today.    Clinical Impairments Affecting Rehab Potential  hx of radiation, hx of infections in right chest    PT Treatment/Interventions  ADLs/Self Care Home Management;Therapeutic exercise;Patient/family education;Orthotic Fit/Training;Manual techniques;Manual lymph drainage;Scar mobilization;Passive range of motion;Taping;Joint Manipulations    PT Next Visit Plan  Cont to need myofascial release to Rt axilla and chest, , assess for independence with self MLD and gentle P/ROM to Rt shoulder as tolerated, consider dry needling at some point???    PT Home Exercise Plan  doorway stretch, standing scapular series yellow band, wall walk horizontally       Patient will benefit from skilled therapeutic intervention in order to improve the following deficits and impairments:  Decreased knowledge of precautions, Impaired UE functional use, Decreased range of motion, Postural dysfunction, Pain, Decreased scar mobility, Increased fascial restricitons, Decreased strength, Increased edema  Visit Diagnosis: Stiffness of right shoulder, not elsewhere classified  Chronic right shoulder pain  Disorder of the skin and subcutaneous tissue related to radiation, unspecified  Abnormal posture     Problem List Patient Active Problem List   Diagnosis Date Noted  . Genetic testing 10/01/2018  . Family history of breast cancer   . Family history of uterine cancer   . Family history of prostate cancer   . S/P mastectomy, right 08/20/2018  . Acquired absence of right breast 08/20/2018  . Port-A-Cath in place 06/21/2018  . MDD (major depressive disorder), recurrent episode, moderate ([place]) 06/13/2018  . Generalized anxiety disorder 06/13/2018  . OCD (obsessive compulsive disorder) 06/13/2018  .  Insomnia 06/13/2018  . Malignant neoplasm of upper-outer quadrant of right breast in female, estrogen receptor negative ([place]) 03/02/2018  . Needs flu shot 06/03/2017  . Chronic bilateral low back pain without sciatica 06/03/2017  . Adjustment disorder with anxious mood 03/27/2016  . Benign paroxysmal positional vertigo 10/31/2014  . Arthritis 04/27/2014  . Fatigue 06/10/2013  . Morbid obesity ([place]) 06/10/2013  . Asthma, chronic obstructive, with acute exacerbation ([place]) 06/10/2013  . Allergy 06/10/2013   Donato Heinz. Owens Shark PT  Norwood Levo 05/23/2019, 11:09 AM  [place], [place], [address] Phone: [phone]   Fax:  [phone]  Name: AMBRIE CARTE MRN: [id number] Date of Birth: [birth year]

## 2019-05-25 MED FILL — LEVOCETIRIZINE 5 MG TABLET: 5 | 30 days supply | Qty: 30 | Fill #0

## 2019-05-25 MED FILL — ALBUTEROL SULFATE HFA 108 (: 108 (90 BAS | 16 days supply | Qty: 18 | Fill #0

## 2019-05-26 ENCOUNTER — Other Ambulatory Visit: Payer: Self-pay

## 2019-05-26 ENCOUNTER — Ambulatory Visit (INDEPENDENT_AMBULATORY_CARE_PROVIDER_SITE_OTHER): Payer: Medicaid Other | Admitting: Psychiatry

## 2019-05-26 ENCOUNTER — Telehealth: Payer: Self-pay

## 2019-05-26 ENCOUNTER — Encounter (HOSPITAL_COMMUNITY): Payer: Self-pay | Admitting: Psychiatry

## 2019-05-26 DIAGNOSIS — F330 Major depressive disorder, recurrent, mild: Secondary | ICD-10-CM | POA: Diagnosis not present

## 2019-05-26 MED ORDER — MIRTAZAPINE 15 MG PO TABS: 15.0000 mg | ORAL_TABLET | Freq: Every day | ORAL | 1 refills | Status: DC

## 2019-05-26 MED ORDER — ALPRAZOLAM 1 MG PO TABS: ORAL_TABLET | ORAL | 3 refills | Status: DC

## 2019-05-26 MED ORDER — ZOLPIDEM TARTRATE 10 MG PO TABS: 10.0000 mg | ORAL_TABLET | Freq: Every evening | ORAL | 3 refills | Status: DC | PRN

## 2019-05-26 MED ORDER — VENLAFAXINE HCL 75 MG PO TABS: 225.0000 mg | ORAL_TABLET | Freq: Every day | ORAL | 0 refills | Status: DC

## 2019-05-26 MED ORDER — ARIPIPRAZOLE 2 MG PO TABS: 2.0000 mg | ORAL_TABLET | Freq: Every day | ORAL | 1 refills | Status: DC

## 2019-05-26 MED FILL — tiZANidine HCL 4 MG TABS: 4 | 30 days supply | Qty: 60 | Fill #0

## 2019-05-26 NOTE — Telephone Encounter (Signed)

## 2019-05-26 NOTE — Patient Instructions (Signed)
1. Continue venlafaxine 225 mg daily 2.ContinueAbilify 2 mg  3. Continue mirtazapine 15 mg at night  4.Continue Xanax 1 mg three times a day (and additional 0.5 mg daily) as needed for anxiety  4. Continue Ambien 10 mg at night as needed for insomnia 5. Next appointment: in December

## 2019-05-27 ENCOUNTER — Other Ambulatory Visit: Payer: Self-pay

## 2019-05-27 ENCOUNTER — Ambulatory Visit (INDEPENDENT_AMBULATORY_CARE_PROVIDER_SITE_OTHER): Payer: Medicaid Other | Admitting: Plastic Surgery

## 2019-05-27 ENCOUNTER — Encounter: Payer: Self-pay | Admitting: Plastic Surgery

## 2019-05-27 VITALS — BP 137/88 | HR 92 | Temp 97.3°F | Ht 64.0 in | Wt 232.4 lb

## 2019-05-27 DIAGNOSIS — Z9011 Acquired absence of right breast and nipple: Secondary | ICD-10-CM

## 2019-05-27 MED FILL — ALPRAZolam 1 MG TABS: 1 | 30 days supply | Qty: 105 | Fill #0

## 2019-05-27 MED FILL — MIRTAZAPINE 15 MG TABLET: 15 | 30 days supply | Qty: 30 | Fill #2

## 2019-05-27 MED FILL — ZOLPIDEM TARTRATE 10 MG TAB: 10 | 30 days supply | Qty: 30 | Fill #0

## 2019-05-27 NOTE — Progress Notes (Signed)
   Subjective:    Patient ID: Jacqueline Good, female    DOB: 04/10/1961, 58 y.o.   MRN: [id number]  The patient is a 58 year old female here for follow-up on her right breast.  She had breast cancer with subsequent reconstruction after mastectomy.  There were complications and she had the expander removed.  She then underwent radiation.  That finished in May 2020.  She is still interested in reconstruction.  Today she comes in because she is very concerned that she could end up having breast cancer on the left side.  She had a mammogram last week and it came back with high density.  She is very tearful today and is thinking about having a left-sided mastectomy.  I do not feel any masses lumps bumps or lymphadenopathy.  Overall her skin looks good on the right.  She does have some post radiation thickening.  No sign of infection.  The skin is healing nicely there is some scar contracture.   Review of Systems  Constitutional: Negative.  Negative for activity change and appetite change.  Eyes: Negative.   Respiratory: Negative.   Cardiovascular: Negative.  Negative for leg swelling.  Gastrointestinal: Negative for abdominal pain.  Genitourinary: Negative.   Musculoskeletal: Negative.   Skin: Positive for color change. Negative for wound.  Hematological: Negative.   Psychiatric/Behavioral: Negative.        Objective:   Physical Exam Vitals signs and nursing note reviewed.  Constitutional:      Appearance: Normal appearance.  Neck:     Musculoskeletal: Normal range of motion.  Cardiovascular:     Pulses: Normal pulses.  Pulmonary:     Effort: Pulmonary effort is normal. No respiratory distress.     Breath sounds: No wheezing.  Abdominal:     General: Abdomen is flat. There is no distension.  Neurological:     General: No focal deficit present.     Mental Status: She is alert and oriented to person, place, and time.     Cranial Nerves: No cranial nerve deficit.  Psychiatric:      Mood and Affect: Mood normal.        Behavior: Behavior normal.        Thought Content: Thought content normal.         Assessment & Plan:     ICD-10-CM   1. S/P mastectomy, right  Z90.11     I will send a note to Dr. Jana Hakim and Dr. Lucia Gaskins.  The patient would like to discuss the possibility of a left mastectomy.  If she does not have a mastectomy a left mastopexy reduction would be a good option for her for better symmetry while she waits for right-sided reconstruction.  The right side with be eligible for reconstruction after May of next year.  I reviewed the timeframe with the patient again as she forgot that the radiation slows down the reconstruction. Pictures were obtained of the patient and placed in the chart with the patient's or guardian's permission.

## 2019-06-01 ENCOUNTER — Other Ambulatory Visit: Payer: Self-pay

## 2019-06-01 ENCOUNTER — Ambulatory Visit: Payer: Medicaid Other

## 2019-06-01 DIAGNOSIS — M25511 Pain in right shoulder: Secondary | ICD-10-CM

## 2019-06-01 DIAGNOSIS — R293 Abnormal posture: Secondary | ICD-10-CM

## 2019-06-01 DIAGNOSIS — L599 Disorder of the skin and subcutaneous tissue related to radiation, unspecified: Secondary | ICD-10-CM

## 2019-06-01 DIAGNOSIS — Z171 Estrogen receptor negative status [ER-]: Secondary | ICD-10-CM

## 2019-06-01 DIAGNOSIS — I972 Postmastectomy lymphedema syndrome: Secondary | ICD-10-CM

## 2019-06-01 DIAGNOSIS — M25611 Stiffness of right shoulder, not elsewhere classified: Secondary | ICD-10-CM | POA: Diagnosis not present

## 2019-06-01 DIAGNOSIS — G8929 Other chronic pain: Secondary | ICD-10-CM

## 2019-06-01 DIAGNOSIS — C50411 Malignant neoplasm of upper-outer quadrant of right female breast: Secondary | ICD-10-CM

## 2019-06-01 NOTE — Therapy (Signed)
[place], [place], [address] Phone: [phone]   Fax:  [phone]  Physical Therapy Treatment  Patient Details  Name: Jacqueline Good MRN: [id number] Date of Birth: 08-23-1960 Referring Provider (PT): [name]   Encounter Date: 06/01/2019  PT End of Session - 06/01/19 1101    Visit Number  23    Number of Visits  26    Date for PT Re-Evaluation  06/20/19    Authorization Type  Medicaid- orginally approved for 3 visits, just got approval for 12 more visits from 7/1 to 8/11, approved for 4 additional visits 03/22/19, approved til 9/22, approved for 6 additional visits 05/10/19-06/20/19, approved again 6 visits from 9/29-11/9    Authorization - Visit Number  2    Authorization - Number of Visits  6    PT Start Time  1005    PT Stop Time  1100    PT Time Calculation (min)  55 min    Activity Tolerance  Patient tolerated treatment well    Behavior During Therapy  [facility] for tasks assessed/performed       Past Medical History:  Diagnosis Date  . Anemia   . Anxiety   . Arthritis    "all over" (08/12/2018)  . Asthma   . Breast cancer, right breast ([place]) dx'd 02/2018  . Chronic bronchitis ([place])   . Depression   . Family history of breast cancer   . Family history of prostate cancer   . Family history of uterine cancer   . GERD (gastroesophageal reflux disease)   . History of blood transfusion    "several; related to low blood" (08/12/2018)  . History of radiation therapy 11/15/18- 12/17/18   Right Chest wall, SCV, PAB. 25 fractions.     Past Surgical History:  Procedure Laterality Date  . AXILLARY LYMPH NODE DISSECTION Right 08/12/2018   Procedure: AXILLARY LYMPH NODE DISSECTION;  Surgeon: Alphonsa Overall, MD;  Location: [place];  Service: General;  Laterality: Right;  . BREAST BIOPSY Right 03/2018  . BREAST RECONSTRUCTION WITH PLACEMENT OF TISSUE EXPANDER AND FLEX HD (ACELLULAR HYDRATED DERMIS) Right 08/12/2018    Procedure: RIGHT BREAST RECONSTRUCTION WITH PLACEMENT OF TISSUE EXPANDER AND FLEX HD (ACELLULAR HYDRATED DERMIS);  Surgeon: Wallace Going, DO;  Location: [place];  Service: Plastics;  Laterality: Right;  . DILATION AND CURETTAGE OF UTERUS    . ENDOMETRIAL ABLATION    . IR IMAGING GUIDED PORT INSERTION  03/18/2018  . MASTECTOMY MODIFIED RADICAL Right 08/12/2018   w/axillary LND  . MASTECTOMY MODIFIED RADICAL Right 08/12/2018   Procedure: RIGHT MODIFIED RADICAL MASTECTOMY;  Surgeon: Alphonsa Overall, MD;  Location: [facility];  Service: General;  Laterality: Right;  . MYOMECTOMY    . REMOVAL OF TISSUE EXPANDER AND PLACEMENT OF IMPLANT Right 10/26/2018   Procedure: Removal of right breast expander;  Surgeon: Wallace Going, DO;  Location: [place];  Service: Plastics;  Laterality: Right;  75 min, please    There were no vitals filed for this visit.  Subjective Assessment - 06/01/19 1011    Subjective  I felt great after seeing Helene Kelp last time, she hit those muscles and I feel so good! I do want to give dry needling a try though to see if I can get more relief/progress. I saw Dr. Marla Roe last week and I was under the impression I was going to be able to have the reconstruction in January and she said it actually wouldn't be until  May bc it needs to be a year after I finished radiation. So kind of frustrated I have to wait, but I think I am going to plan on doinga reduction on the left as soon as I can.    Pertinent History  Patient was diagnosed on 02/04/18 with right invasive ductal carcinoma breast cancer. It measures 3.1 cm and is located in the upper outer quadrant. It is ER/PR negative and HER2 positive with a Ki67 of 50%. She has a known positive axillary lymph node. She has no other medical problems but does smoke 1 pack per day., 08/12/18- right mastectomy with ALND, 10/26/18- pt had expander removed because of infection, she has completed chemo and radiation    Patient Stated Goals  to get rid  of these spasms    Currently in Pain?  No/denies                       [facility] Adult PT Treatment/Exercise - 06/01/19 0001      Manual Therapy   Soft tissue mobilization  prolonged pressure to tight trigger points at right lats and also maintained pressure in Lt S/L    Myofascial Release  To Rt chest     Passive ROM  to Rt shoulder as tolerated in direction of flexion, abduction, and ER and D2                  PT Long Term Goals - 06/01/19 1108      PT LONG TERM GOAL #1   Title  Pt will report a 75% improvement in right chest pain to decrease discomfort caused by spasms.    Baseline  65% improvement reported at this time-02/01/19, 02/25/19- 75% improved; 80% improved at this time-04/20/19, 70% on 10/8 after break    Status  Partially Met      PT LONG TERM GOAL #2   Title  Pt will be independent in a home exercise program for continued strengthening and stretching    Baseline  Pt is independent with initial HEP for AA/ROM, is notyet ready for strength due to tightness/spasms-02/01/19, 03/22/19- pt is now indep in strengthenign and stretching exercises; pt reports doing HEP for strength every other day and stretching daily-04/20/19, streching program only 05/19/19    Status  Partially Met      PT LONG TERM GOAL #3   Title  Pt will report a 25% improvement in swelling in right chest to allow improved comfort.    Baseline  30% improvement reported at this time-02/01/19, 02/25/19- pt reports she is now having more swelling, it has worsened, 03/22/19- 15% improved; 40% improvment at this time-04/20/19, 40% on 05/19/19    Status  Achieved      PT LONG TERM GOAL #4   Title  Pt will demonstrate 150 degrees of right shoulder flexion to allow her to reach overhead.    Baseline  116; 126 degrees- 02/01/19, 02/25/19- 127 degrees 03/22/19- R 151; Rt flex 154 degrees-04/20/19; R flex: 135 on 05/19/19    Status  Partially Met      PT LONG TERM GOAL #5   Title  Pt will demonstrate 140 degrees  of right shoulder abduction to allow her to reach out to the side.    Baseline  89; 106 degrees - 02/01/19, 02/25/19- 130, 03/22/19- 124; Rt abd 139 degrees-04/20/19, 123 on 10/8    Status  On-going            Plan -  06/01/19 1105    Clinical Impression Statement  Pt continues with tight trigger points at latt area with tenderness that improved after manual therapy. She is interested in trying dry needling and so will route note to PT for this to be added to plan.    Personal Factors and Comorbidities  Comorbidity 1;Comorbidity 2;Other    Comorbidities  depression, anxiety, lives alone    Examination-Activity Limitations  Reach Overhead;Carry    Examination-Participation Restrictions  Cleaning    Stability/Clinical Decision Making  Evolving/Moderate complexity    Rehab Potential  Good    Clinical Impairments Affecting Rehab Potential  hx of radiation, hx of infections in right chest    PT Frequency  1x / week    PT Duration  Other (comment)   5 weeks   PT Treatment/Interventions  ADLs/Self Care Home Management;Therapeutic exercise;Patient/family education;Orthotic Fit/Training;Manual techniques;Manual lymph drainage;Scar mobilization;Passive range of motion;Taping;Joint Manipulations    PT Next Visit Plan  Cont to need myofascial release to Rt axilla and chest, , assess for independence with self MLD and gentle P/ROM to Rt shoulder as tolerated, have pt schedule dry needling when recert signed    PT Home Exercise Plan  doorway stretch, standing scapular series yellow band, wall walk horizontally    Consulted and Agree with Plan of Care  Patient       Patient will benefit from skilled therapeutic intervention in order to improve the following deficits and impairments:  Decreased knowledge of precautions, Impaired UE functional use, Decreased range of motion, Postural dysfunction, Pain, Decreased scar mobility, Increased fascial restricitons, Decreased strength, Increased edema  Visit  Diagnosis: Stiffness of right shoulder, not elsewhere classified  Chronic right shoulder pain  Disorder of the skin and subcutaneous tissue related to radiation, unspecified  Abnormal posture  Postmastectomy lymphedema  Malignant neoplasm of upper-outer quadrant of right breast in female, estrogen receptor negative ([place])     Problem List Patient Active Problem List   Diagnosis Date Noted  . Genetic testing 10/01/2018  . Family history of breast cancer   . Family history of uterine cancer   . Family history of prostate cancer   . S/P mastectomy, right 08/20/2018  . Acquired absence of right breast 08/20/2018  . Port-A-Cath in place 06/21/2018  . MDD (major depressive disorder), recurrent episode, moderate ([place]) 06/13/2018  . Generalized anxiety disorder 06/13/2018  . OCD (obsessive compulsive disorder) 06/13/2018  . Insomnia 06/13/2018  . Malignant neoplasm of upper-outer quadrant of right breast in female, estrogen receptor negative ([place]) 03/02/2018  . Needs flu shot 06/03/2017  . Chronic bilateral low back pain without sciatica 06/03/2017  . Adjustment disorder with anxious mood 03/27/2016  . Benign paroxysmal positional vertigo 10/31/2014  . Arthritis 04/27/2014  . Fatigue 06/10/2013  . Morbid obesity ([place]) 06/10/2013  . Asthma, chronic obstructive, with acute exacerbation ([place]) 06/10/2013  . Allergy 06/10/2013    Otelia Limes, PTA 06/01/2019, 11:09 AM  [place], [place], [address] Phone: [phone]   Fax:  [phone]  Name: Jacqueline Good MRN: [id number] Date of Birth: 1961/05/06

## 2019-06-06 ENCOUNTER — Telehealth: Payer: Self-pay | Admitting: Adult Health

## 2019-06-06 ENCOUNTER — Ambulatory Visit (HOSPITAL_BASED_OUTPATIENT_CLINIC_OR_DEPARTMENT_OTHER)
Admission: RE | Admit: 2019-06-06 | Discharge: 2019-06-06 | Disposition: A | Discharge status: Home / Self Care | Payer: Medicaid Other | Source: Ambulatory Visit | Attending: Cardiology | Admitting: Cardiology

## 2019-06-06 ENCOUNTER — Encounter (HOSPITAL_COMMUNITY): Payer: Self-pay | Admitting: Cardiology

## 2019-06-06 ENCOUNTER — Other Ambulatory Visit: Payer: Self-pay

## 2019-06-06 ENCOUNTER — Ambulatory Visit (HOSPITAL_COMMUNITY)
Admission: RE | Admit: 2019-06-06 | Discharge: 2019-06-06 | Disposition: A | Discharge status: Home / Self Care | Payer: Medicaid Other | Source: Ambulatory Visit | Attending: Cardiology | Admitting: Cardiology

## 2019-06-06 VITALS — BP 122/68 | HR 64 | Wt 231.0 lb

## 2019-06-06 DIAGNOSIS — Z9011 Acquired absence of right breast and nipple: Secondary | ICD-10-CM | POA: Diagnosis not present

## 2019-06-06 DIAGNOSIS — Z9221 Personal history of antineoplastic chemotherapy: Secondary | ICD-10-CM | POA: Diagnosis not present

## 2019-06-06 DIAGNOSIS — Z79899 Other long term (current) drug therapy: Secondary | ICD-10-CM | POA: Insufficient documentation

## 2019-06-06 DIAGNOSIS — Z171 Estrogen receptor negative status [ER-]: Secondary | ICD-10-CM | POA: Diagnosis not present

## 2019-06-06 DIAGNOSIS — Z8279 Family history of other congenital malformations, deformations and chromosomal abnormalities: Secondary | ICD-10-CM | POA: Diagnosis not present

## 2019-06-06 DIAGNOSIS — C50911 Malignant neoplasm of unspecified site of right female breast: Secondary | ICD-10-CM | POA: Insufficient documentation

## 2019-06-06 DIAGNOSIS — K219 Gastro-esophageal reflux disease without esophagitis: Secondary | ICD-10-CM | POA: Diagnosis not present

## 2019-06-06 DIAGNOSIS — C50411 Malignant neoplasm of upper-outer quadrant of right female breast: Secondary | ICD-10-CM | POA: Diagnosis not present

## 2019-06-06 DIAGNOSIS — E7800 Pure hypercholesterolemia, unspecified: Secondary | ICD-10-CM

## 2019-06-06 DIAGNOSIS — Z87891 Personal history of nicotine dependence: Secondary | ICD-10-CM | POA: Diagnosis not present

## 2019-06-06 DIAGNOSIS — Z7951 Long term (current) use of inhaled steroids: Secondary | ICD-10-CM | POA: Insufficient documentation

## 2019-06-06 DIAGNOSIS — Z923 Personal history of irradiation: Secondary | ICD-10-CM | POA: Insufficient documentation

## 2019-06-06 DIAGNOSIS — Z8249 Family history of ischemic heart disease and other diseases of the circulatory system: Secondary | ICD-10-CM | POA: Insufficient documentation

## 2019-06-06 NOTE — Patient Instructions (Signed)
Your physician recommends that you schedule a follow-up appointment in: As needed. Call our office if you have any cardiac concerns or need for medication refill.  At the [facility], you and your health needs are our priority. As part of our continuing mission to provide you with exceptional heart care, we have created designated Provider Care Teams. These Care Teams include your primary Cardiologist (physician) and Advanced Practice Providers (APPs- Physician Assistants and Nurse Practitioners) who all work together to provide you with the care you need, when you need it.   You may see any of the following providers on your designated Care Team at your next follow up: Marland Kitchen Dr Glori Bickers . Dr Loralie Champagne . Darrick Grinder, NP . Lyda Jester, PA   Please be sure to bring in all your medications bottles to every appointment.

## 2019-06-06 NOTE — Progress Notes (Signed)
Echocardiogram 2D Echocardiogram has been performed.  [place] Deputy Elisabet Gutzmer 06/06/2019, 3:02 PM

## 2019-06-06 NOTE — Progress Notes (Signed)
Oncology: Dr. Jana Hakim  58 y.o. with history of breast cancer was referred by Dr. Jana Hakim for cardio-oncology evaluation.  Breast cancer was diagnosed on right in 7/29, positive axillary node. ER-/PR-/HER2+.  She had carboplatin, docetaxel, Herceptin, and pertuzumab from 8/19-12/19.  Herceptin will be continued to 9/20.  She had modified radical mastectomy on right in 1/20.  Radiation from 4/20-5/20.  She has had surveillance echoes given Herceptin use.  In 3/20, echo showed fall in EF and Herceptin was held.  Echo in 5/20 showed recovery in EF and normal strain pattern, so Herceptin was restarted.  She has now completed Herceptin therapy.   She is doing well currently.  She was having some "chest muscle spasms" but this has resolved.  No chest pain or exertional dyspnea.    Labs (7/20): LDL 115  PMH: 1. GERD 2. Breast cancer: Diagnosed on right in 7/29, positive axillary node. ER-/PR-/HER2+.  She had carboplatin, docetaxel, Herceptin, and pertuzumab from 8/19-12/19.  Herceptin will be continued to 9/20.  She had modified radical mastectomy on right in 1/20.  Radiation from 4/20-5/20.   - Echo (8/19): EF 55-60%, GLS -23.6%.  - Echo (11/19): EF 55-60%, GLS -22.6% - Echo (3/20): EF 45-50% => Herceptin held - Echo (5/20): EF 55-60%, GLS -20.7% => Herceptin restarted  - Echo (7/20): EF 55-60%, GLS -18.3%.  - Echo (10/20): EF 55-60%, GLS -20.3%  FH: 2 brothers with congenital heart disease, sister with SLE, father with CABG at age 21.   Social History   Socioeconomic History  . Marital status: Single    Spouse name: Not on file  . Number of children: Not on file  . Years of education: Not on file  . Highest education level: Not on file  Occupational History  . Not on file  Social Needs  . Financial resource strain: Not on file  . Food insecurity    Worry: Not on file    Inability: Not on file  . Transportation needs    Medical: No    Non-medical: No  Tobacco Use  . Smoking status:  Former Smoker    Packs/day: 1.00    Years: 38.00    Pack years: 38.00    Types: Cigarettes    Quit date: 05/15/2018    Years since quitting: 1.0  . Smokeless tobacco: Never Used  Substance and Sexual Activity  . Alcohol use: Not Currently    Comment: 08/12/2018 "nothing since 04/2018"  . Drug use: No  . Sexual activity: Not Currently    Birth control/protection: None  Lifestyle  . Physical activity    Days per week: Not on file    Minutes per session: Not on file  . Stress: Not on file  Relationships  . Social Herbalist on phone: Not on file    Gets together: Not on file    Attends religious service: Not on file    Active member of club or organization: Not on file    Attends meetings of clubs or organizations: Not on file    Relationship status: Not on file  . Intimate partner violence    Fear of current or ex partner: No    Emotionally abused: No    Physically abused: No    Forced sexual activity: No  Other Topics Concern  . Not on file  Social History Narrative  . Not on file   ROS: All systems reviewed and negative except as noted in HPI.   Current Outpatient  Medications  Medication Sig Dispense Refill  . ALPRAZolam (XANAX) 1 MG tablet 1 mg three times and 0.5 mg per day as needed for anxiety. No more than 3.5 mg per day 105 tablet 0  . ARIPiprazole (ABILIFY) 2 MG tablet Take 1 tablet (2 mg total) by mouth daily. 90 tablet 1  . esomeprazole (NEXIUM) 40 MG capsule TAKE ONE CAPSULE BY MOUTH DAILY 30 capsule 2  . fluticasone (FLONASE) 50 MCG/ACT nasal spray Place 2 sprays into both nostrils daily as needed for up to 14 days for allergies. 1 g 1  . Fluticasone-Salmeterol (ADVAIR DISKUS) 250-50 MCG/DOSE AEPB INHALE 1 PUFF INTO THE LUNGS 2 TIMES DAILY. 60 each 2  . gabapentin (NEURONTIN) 300 MG capsule Take 1 capsule (300 mg total) by mouth at bedtime. 30 capsule 5  . loratadine (CLARITIN) 10 MG tablet Take 1 tablet (10 mg total) by mouth daily as needed for  allergies. 30 tablet 11  . mirtazapine (REMERON) 15 MG tablet Take 1 tablet (15 mg total) by mouth at bedtime. 90 tablet 1  . naproxen (NAPROSYN) 500 MG tablet As needed.    Marland Kitchen omeprazole (PRILOSEC) 40 MG capsule TAKE 1 CAPSULE BY MOUTH DAILY. 30 capsule 2  . propranolol (INDERAL) 40 MG tablet Take 40 mg by mouth daily as needed (heart palpitation.).     Marland Kitchen tiZANidine (ZANAFLEX) 4 MG tablet     . [START ON 08/19/2019] venlafaxine (EFFEXOR) 75 MG tablet Take 3 tablets (225 mg total) by mouth daily. 270 tablet 0  . zolpidem (AMBIEN) 10 MG tablet Take 1 tablet (10 mg total) by mouth at bedtime as needed for sleep. 30 tablet 3   No current facility-administered medications for this encounter.    BP 122/68   Pulse 64   Wt 104.8 kg (231 lb)   SpO2 100%   BMI 39.65 kg/m  General: NAD, obese.  Neck: No JVD, no thyromegaly or thyroid nodule.  Lungs: Clear to auscultation bilaterally with normal respiratory effort. CV: Nondisplaced PMI.  Heart regular S1/S2, no S3/S4, no murmur.  No peripheral edema.  No carotid bruit.  Normal pedal pulses.  Abdomen: Soft, nontender, no hepatosplenomegaly, no distention.  Skin: Intact without lesions or rashes.  Neurologic: Alert and oriented x 3.  Psych: Normal affect. Extremities: No clubbing or cyanosis.  HEENT: Normal.    Assessment/Plan: 1. Breast cancer: She completed Herceptin therapy in 9/20.  She had a fall in EF noted on 3/20 echo, but echo in 5/20 showed EF 55-60% with normal strain pattern and Herceptin was restarted.  She was not started on beta blocker or ACEI/ARB when her EF fell.  Echo was done today and reviewed, EF 55-60% with normal strain pattern. As she has finished Herceptin, she will not need further screening echoes.  2. FH CAD: LDL acceptable at 115 in 7/20, excellent HDL.  I would not start her on a statin.   Loralie Champagne 06/06/2019

## 2019-06-07 ENCOUNTER — Ambulatory Visit: Payer: Medicaid Other

## 2019-06-07 DIAGNOSIS — R293 Abnormal posture: Secondary | ICD-10-CM

## 2019-06-07 DIAGNOSIS — G8929 Other chronic pain: Secondary | ICD-10-CM

## 2019-06-07 DIAGNOSIS — C50411 Malignant neoplasm of upper-outer quadrant of right female breast: Secondary | ICD-10-CM

## 2019-06-07 DIAGNOSIS — M25611 Stiffness of right shoulder, not elsewhere classified: Secondary | ICD-10-CM

## 2019-06-07 DIAGNOSIS — L599 Disorder of the skin and subcutaneous tissue related to radiation, unspecified: Secondary | ICD-10-CM

## 2019-06-07 DIAGNOSIS — Z171 Estrogen receptor negative status [ER-]: Secondary | ICD-10-CM

## 2019-06-07 DIAGNOSIS — M25511 Pain in right shoulder: Secondary | ICD-10-CM

## 2019-06-07 DIAGNOSIS — I972 Postmastectomy lymphedema syndrome: Secondary | ICD-10-CM

## 2019-06-07 NOTE — Therapy (Addendum)
[place], [place], [address] Phone: [phone]   Fax:  [phone]  Physical Therapy Treatment  Patient Details  Name: Jacqueline Good MRN: [id number] Date of Birth: 08/19/1960 Referring Provider (PT): [name]   Encounter Date: 06/07/2019  PT End of Session - 06/07/19 1159    Visit Number  24    Number of Visits  26    Date for PT Re-Evaluation  06/20/19    Authorization Type  Medicaid- orginally approved for 3 visits, just got approval for 12 more visits from 7/1 to 8/11, approved for 4 additional visits 03/22/19, approved til 9/22, approved for 6 additional visits 05/10/19-06/20/19, approved again 6 visits from 9/29-11/9    Authorization - Visit Number  3    Authorization - Number of Visits  6    PT Start Time  1103    PT Stop Time  1159    PT Time Calculation (min)  56 min    Activity Tolerance  Patient tolerated treatment well    Behavior During Therapy  [facility] for tasks assessed/performed       Past Medical History:  Diagnosis Date  . Anemia   . Anxiety   . Arthritis    "all over" (08/12/2018)  . Asthma   . Breast cancer, right breast ([place]) dx'd 02/2018  . Chronic bronchitis ([place])   . Depression   . Family history of breast cancer   . Family history of prostate cancer   . Family history of uterine cancer   . GERD (gastroesophageal reflux disease)   . History of blood transfusion    "several; related to low blood" (08/12/2018)  . History of radiation therapy 11/15/18- 12/17/18   Right Chest wall, SCV, PAB. 25 fractions.     Past Surgical History:  Procedure Laterality Date  . AXILLARY LYMPH NODE DISSECTION Right 08/12/2018   Procedure: AXILLARY LYMPH NODE DISSECTION;  Surgeon: Alphonsa Overall, MD;  Location: [facility];  Service: General;  Laterality: Right;  . BREAST BIOPSY Right 03/2018  . BREAST RECONSTRUCTION WITH PLACEMENT OF TISSUE EXPANDER AND FLEX HD (ACELLULAR HYDRATED DERMIS) Right 08/12/2018    Procedure: RIGHT BREAST RECONSTRUCTION WITH PLACEMENT OF TISSUE EXPANDER AND FLEX HD (ACELLULAR HYDRATED DERMIS);  Surgeon: Wallace Going, DO;  Location: [facility];  Service: Plastics;  Laterality: Right;  . DILATION AND CURETTAGE OF UTERUS    . ENDOMETRIAL ABLATION    . IR IMAGING GUIDED PORT INSERTION  03/18/2018  . MASTECTOMY MODIFIED RADICAL Right 08/12/2018   w/axillary LND  . MASTECTOMY MODIFIED RADICAL Right 08/12/2018   Procedure: RIGHT MODIFIED RADICAL MASTECTOMY;  Surgeon: Alphonsa Overall, MD;  Location: [facility];  Service: General;  Laterality: Right;  . MYOMECTOMY    . REMOVAL OF TISSUE EXPANDER AND PLACEMENT OF IMPLANT Right 10/26/2018   Procedure: Removal of right breast expander;  Surgeon: Wallace Going, DO;  Location: [facility];  Service: Plastics;  Laterality: Right;  75 min, please    There were no vitals filed for this visit.  Subjective Assessment - 06/07/19 1113    Subjective  I think I'm doing pretty well and ready to D/C.    Pertinent History  Patient was diagnosed on 02/04/18 with right invasive ductal carcinoma breast cancer. It measures 3.1 cm and is located in the upper outer quadrant. It is ER/PR negative and HER2 positive with a Ki67 of 50%. She has a known positive axillary lymph node. She has no other medical problems but  does smoke 1 pack per day., 08/12/18- right mastectomy with ALND, 10/26/18- pt had expander removed because of infection, she has completed chemo and radiation    Patient Stated Goals  to get rid of these spasms    Currently in Pain?  No/denies         [facility] PT Assessment - 06/07/19 0001      AROM   Right Shoulder Flexion  150 Degrees    Right Shoulder ABduction  147 Degrees   just feels tightness, no pain                  [facility] Adult PT Treatment/Exercise - 06/07/19 0001      Manual Therapy   Soft tissue mobilization  prolonged pressure to tight trigger points at right lats and also maintained pressure in Lt S/L     Myofascial Release  To Rt chest     Passive ROM  to Rt shoulder as tolerated in direction of flexion, abduction, and ER and D2                  PT Long Term Goals - 06/07/19 1114      PT LONG TERM GOAL #1   Title  Pt will report a 75% improvement in right chest pain to decrease discomfort caused by spasms.    Baseline  65% improvement reported at this time-02/01/19, 02/25/19- 75% improved; 80% improved at this time-04/20/19, 70% on 10/8 after break; 85% improvement reported at this time-06/07/19    Status  Achieved      PT LONG TERM GOAL #2   Title  Pt will be independent in a home exercise program for continued strengthening and stretching    Baseline  Pt is independent with initial HEP for AA/ROM, is not yet ready for strength due to tightness/spasms-02/01/19, 03/22/19- pt is now indep in strengthenign and stretching exercises; pt reports doing HEP for strength every other day and stretching daily-04/20/19, streching program only 05/19/19; pt reports independence with all at this time - 06/07/19    Status  Achieved      PT LONG TERM GOAL #3   Title  Pt will report a 25% improvement in swelling in right chest to allow improved comfort.    Baseline  30% improvement reported at this time-02/01/19, 02/25/19- pt reports she is now having more swelling, it has worsened, 03/22/19- 15% improved; 40% improvment at this time-04/20/19, 40% on 05/19/19; 85% reported at this time - 06/07/19    Status  Achieved      PT LONG TERM GOAL #4   Title  Pt will demonstrate 150 degrees of right shoulder flexion to allow her to reach overhead.    Baseline  116; 126 degrees- 02/01/19, 02/25/19- 127 degrees 03/22/19- R 151; Rt flex 154 degrees-04/20/19; R flex: 135 on 05/19/19; 150 degrees - 06/07/19    Status  Achieved      PT LONG TERM GOAL #5   Title  Pt will demonstrate 140 degrees of right shoulder abduction to allow her to reach out to the side.    Baseline  89; 106 degrees - 02/01/19, 02/25/19- 130, 03/22/19- 124; Rt  abd 139 degrees-04/20/19, 123 on 10/8; 147 degrees - 06/07/19    Status  Achieved            Plan - 06/07/19 1202    Clinical Impression Statement  Pt overall has done well wtih this episode of therapy and reports 85% improvement of her chest pain and  spasms. She will be D/C this visit as all goals have been met.    Personal Factors and Comorbidities  Comorbidity 1;Comorbidity 2;Other    Comorbidities  depression, anxiety, lives alone    Examination-Activity Limitations  Reach Overhead;Carry    Examination-Participation Restrictions  Cleaning    Stability/Clinical Decision Making  Evolving/Moderate complexity    Rehab Potential  Good    Clinical Impairments Affecting Rehab Potential  hx of radiation, hx of infections in right chest    PT Frequency  1x / week    PT Duration  Other (comment)   5 weeks   PT Treatment/Interventions  ADLs/Self Care Home Management;Therapeutic exercise;Patient/family education;Orthotic Fit/Training;Manual techniques;Manual lymph drainage;Scar mobilization;Passive range of motion;Taping;Joint Manipulations    PT Next Visit Plan  D/C this visit.    PT Home Exercise Plan  doorway stretch, standing scapular series yellow band, wall walk horizontally    Consulted and Agree with Plan of Care  Patient       Patient will benefit from skilled therapeutic intervention in order to improve the following deficits and impairments:  Decreased knowledge of precautions, Impaired UE functional use, Decreased range of motion, Postural dysfunction, Pain, Decreased scar mobility, Increased fascial restricitons, Decreased strength, Increased edema  Visit Diagnosis: Stiffness of right shoulder, not elsewhere classified  Chronic right shoulder pain  Disorder of the skin and subcutaneous tissue related to radiation, unspecified  Abnormal posture  Postmastectomy lymphedema  Malignant neoplasm of upper-outer quadrant of right breast in female, estrogen receptor negative  ([place])     Problem List Patient Active Problem List   Diagnosis Date Noted  . Genetic testing 10/01/2018  . Family history of breast cancer   . Family history of uterine cancer   . Family history of prostate cancer   . S/P mastectomy, right 08/20/2018  . Acquired absence of right breast 08/20/2018  . Port-A-Cath in place 06/21/2018  . MDD (major depressive disorder), recurrent episode, moderate ([place]) 06/13/2018  . Generalized anxiety disorder 06/13/2018  . OCD (obsessive compulsive disorder) 06/13/2018  . Insomnia 06/13/2018  . Malignant neoplasm of upper-outer quadrant of right breast in female, estrogen receptor negative ([place]) 03/02/2018  . Needs flu shot 06/03/2017  . Chronic bilateral low back pain without sciatica 06/03/2017  . Adjustment disorder with anxious mood 03/27/2016  . Benign paroxysmal positional vertigo 10/31/2014  . Arthritis 04/27/2014  . Fatigue 06/10/2013  . Morbid obesity ([place]) 06/10/2013  . Asthma, chronic obstructive, with acute exacerbation ([place]) 06/10/2013  . Allergy 06/10/2013    Otelia Limes, PTA 06/07/2019, 12:13 PM  [place], [place], [address] Phone: [phone]   Fax:  [phone]  Name: Jacqueline Good MRN: [id number] Date of Birth: [birth year] PHYSICAL THERAPY DISCHARGE SUMMARY  Visits from Start of Care: 24  Current functional level related to goals / functional outcomes: As above    Remaining deficits: As above    Education / Equipment: As above  Plan: Patient agrees to discharge.  Patient goals were met. Patient is being discharged due to being pleased with the current functional level.  ?????     Donato Heinz. Owens Shark, PT

## 2019-06-13 ENCOUNTER — Other Ambulatory Visit: Payer: Self-pay | Admitting: Oncology

## 2019-06-13 ENCOUNTER — Telehealth: Payer: Self-pay | Admitting: Adult Health

## 2019-06-13 ENCOUNTER — Telehealth: Payer: Self-pay | Admitting: *Deleted

## 2019-06-13 NOTE — Telephone Encounter (Signed)
Pt called requesting another day for virtual appt with Wilber Bihari NP. Scheduling message was sent to reschedule

## 2019-06-13 NOTE — Telephone Encounter (Signed)
R/s appt per 11/2 sch message- pt aware of apt date and time

## 2019-06-14 ENCOUNTER — Inpatient Hospital Stay: Payer: Medicaid Other | Admitting: Adult Health

## 2019-06-14 MED FILL — NAPROXEN 500 MG TABS: 500 | 30 days supply | Qty: 60 | Fill #0

## 2019-06-14 MED FILL — ESOMEPRAZOLE MAG DR 40 MG C: 40 | 30 days supply | Qty: 30 | Fill #2

## 2019-06-29 ENCOUNTER — Telehealth: Payer: Self-pay | Admitting: Adult Health

## 2019-06-29 ENCOUNTER — Inpatient Hospital Stay: Payer: Medicaid Other | Attending: Oncology | Admitting: Adult Health

## 2019-06-29 DIAGNOSIS — Z171 Estrogen receptor negative status [ER-]: Secondary | ICD-10-CM

## 2019-06-29 DIAGNOSIS — C50411 Malignant neoplasm of upper-outer quadrant of right female breast: Secondary | ICD-10-CM

## 2019-06-29 NOTE — Telephone Encounter (Signed)
Scheduled appt per 11/18 sch message - pt is aware of appt date and time  

## 2019-06-29 NOTE — Progress Notes (Signed)
Patient requested to be rescheduled to 4 weeks from now.  She did ask me to contact Dr. Marla Roe about her reconstruction.  Wilber Bihari, NP

## 2019-06-30 MED FILL — MIRTAZAPINE 15 MG TABLET: 15 | 90 days supply | Qty: 90 | Fill #0

## 2019-06-30 MED FILL — ZOLPIDEM TARTRATE 10 MG TAB: 10 | 30 days supply | Qty: 15 | Fill #1

## 2019-06-30 MED FILL — ALPRAZolam 1 MG TABS: 1 | 30 days supply | Qty: 105 | Fill #1

## 2019-06-30 MED FILL — ALBUTEROL SULFATE HFA 108 (: 108 (90 BAS | 16 days supply | Qty: 18 | Fill #1

## 2019-07-06 MED FILL — PENICILLIN VK 500 MG TABLET: 500 | 10 days supply | Qty: 40 | Fill #0

## 2019-07-06 MED FILL — HYDROCODON-APAP 10-325: 10-325 | 5 days supply | Qty: 20 | Fill #0

## 2019-07-17 NOTE — Progress Notes (Signed)
[place]  [phone]) [phone] [phone]) [phone]    ID: Jacqueline Good DOB: 1961/05/14  MR#: [id number]  VWP#:[id number]  Patient Care Team: Vonna Drafts, FNP as PCP - General (Nurse Practitioner) Alphonsa Overall, MD as Consulting Physician (General Surgery) Nelson Noone, [name], MD as Consulting Physician (Oncology) Eppie Gibson, MD as Attending Physician (Radiation Oncology) Dillingham, Loel Lofty, DO as Attending Physician (Plastic Surgery) Norman Clay, MD as Consulting Physician (Psychiatry) Savas, Allison Quarry, MD as Consulting Physician (Dermatology) OTHER MD: Donnal Moat, PA-C [facility] [phone]   CHIEF COMPLAINT: Estrogen receptor 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 She completed trastuzumab treatments on 03/31/2019.  Her repeat echocardiogram on 06/06/2019 showed an ejection fraction of 55-60%.  She had her port removed, without event  Since her last visit, she underwent left screening mammography with tomography at [place] on 04/13/2019 showing: breast density category C; no evidence of malignancy.  She also underwent excision of a right nasal skin lesion on 06/13/2019 under Dr. Shary Decamp at [facility]. Pathology from the procedure ([id number]) showed: follicular cyst, infundibular type (epidermoid cyst).  She also underwent Covid-19 testing on 05/13/2019 and on 06/25/2019 at [facility]. Both tests were negative.  She underwent chest x-ray on 05/27/2019 at [facility] after her negative 05/13/2019 result. This showed no acute process.   REVIEW OF SYSTEMS: Jacqueline Good is concerned that she has gained so much weight.  Aside from that she is doing well.  She is taking appropriate pandemic precautions.  She helps her mother who lives next door do her shopping.  She otherwise pretty much stays in.  She is not  exercising.  She would like to reconsider we construction on the right side.  She is thinking of reduction on the left side.  Aside from these issues a detailed review of systems today was stable   HISTORY OF CURRENT ILLNESS: From the original intake note:  Jacqueline Good had routine screening mammography on 02/05/2018 showing a possible abnormality in the right breast. She underwent unilateral right diagnostic mammography with tomography and right breast ultrasonography at [facility] on 02/15/2018 showing: Highly suspicious right breast mass at 10 o'clock position.  There was a second, 0.6 cm lesion at the 10:00 radiant which has not been biopsied.  Suspicious focal cortical thickening of a single right axillary lymph node.  Accordingly on 02/25/2018 she proceeded to biopsy of the right breast area in question. The pathology from this procedure showed ([id number]): Breast, right, needle core biopsy, OUQ with microscopic focus of invasive ductal carcinoma, grade 2, arising in a background of high grade ductal carcinoma in situ. 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   The patient's subsequent history is as detailed below.   PAST MEDICAL HISTORY: Past Medical History:  Diagnosis Date  . Anemia   . Anxiety   . Arthritis    "all over" (08/12/2018)  . Asthma   . Breast cancer, right breast ([place]) dx'd 02/2018  . Chronic bronchitis ([place])   . Depression   . Family history of breast cancer   . Family history of prostate cancer   . Family history of uterine cancer   . GERD (gastroesophageal reflux disease)   . History of blood transfusion    "several; related to low blood" (08/12/2018)  . History of radiation therapy 11/15/18- 12/17/18  Right Chest wall, SCV, PAB. 25 fractions.     PAST SURGICAL HISTORY: Past Surgical History:  Procedure Laterality Date  . AXILLARY LYMPH NODE DISSECTION Right 08/12/2018   Procedure: AXILLARY  LYMPH NODE DISSECTION;  Surgeon: Alphonsa Overall, MD;  Location: [facility];  Service: General;  Laterality: Right;  . BREAST BIOPSY Right 03/2018  . BREAST RECONSTRUCTION WITH PLACEMENT OF TISSUE EXPANDER AND FLEX HD (ACELLULAR HYDRATED DERMIS) Right 08/12/2018   Procedure: RIGHT BREAST RECONSTRUCTION WITH PLACEMENT OF TISSUE EXPANDER AND FLEX HD (ACELLULAR HYDRATED DERMIS);  Surgeon: Wallace Going, DO;  Location: [facility];  Service: Plastics;  Laterality: Right;  . DILATION AND CURETTAGE OF UTERUS    . ENDOMETRIAL ABLATION    . IR IMAGING GUIDED PORT INSERTION  03/18/2018  . MASTECTOMY MODIFIED RADICAL Right 08/12/2018   w/axillary LND  . MASTECTOMY MODIFIED RADICAL Right 08/12/2018   Procedure: RIGHT MODIFIED RADICAL MASTECTOMY;  Surgeon: Alphonsa Overall, MD;  Location: [facility];  Service: General;  Laterality: Right;  . MYOMECTOMY    . REMOVAL OF TISSUE EXPANDER AND PLACEMENT OF IMPLANT Right 10/26/2018   Procedure: Removal of right breast expander;  Surgeon: Wallace Going, DO;  Location: [facility];  Service: Plastics;  Laterality: Right;  75 min, please    FAMILY HISTORY: Family History  Problem Relation Age of Onset  . Lupus Sister   . Heart disease Sister   . Breast cancer Mother 71  . Prostate cancer Paternal Uncle   . Diabetes Maternal Grandmother   . Heart Problems Maternal Grandmother   . Prostate cancer Maternal Grandfather   . Prostate cancer Paternal Grandfather   . Prostate cancer Other        MGFs brother  . Breast cancer Other        Mat great-grandmother's sister  . Uterine cancer Maternal Great-grandmother   . Prostate cancer Other        PGFs brother   She notes that her father is currently 54 years old. Patients' mother is currently 51 years old and she was diagnosed with breast cancer at age 22. The patient has 4 brothers and 1 sister. She has a maternal aunt with breast cancer and her maternal great grandmother with had ovarian cancer.    GYNECOLOGIC HISTORY:  No LMP  recorded. Patient is postmenopausal. Menarche: 58 years old Age at first live birth: 58 years old GX P: 1 LMP: 13-14 years ago s/p ablation Contraceptive: no HRT: no  Hysterectomy: no SO: no   SOCIAL HISTORY: She is currently unemployed.  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 She lives alone without pets. Her daughter is [name] who lives in [place] and works in Therapist, art.  The patient has one grandchild. She goes to a [employer].   ADVANCED DIRECTIVES: Not in place.  At the 03/03/2018 visit the patient was given the appropriate documents to complete and notarized at her discretion   HEALTH MAINTENANCE: Social History   Tobacco Use  . Smoking status: Former Smoker    Packs/day: 1.00    Years: 38.00    Pack years: 38.00    Types: Cigarettes    Quit date: 05/15/2018    Years since quitting: 1.1  . Smokeless tobacco: Never Used  Substance Use Topics  . Alcohol use: Not Currently    Comment: 08/12/2018 "nothing since 04/2018"  . Drug use: No     Colonoscopy: Never  PAP:   Bone density: Never   Allergies  Allergen Reactions  .  Gadolinium Derivatives Itching and Cough    Pt began sneezing and coughing as soon as MRI contrast was injected. Severe nasal congestion. No rash or hives no SOB.   Marland Kitchen Hydrocodone Hives and Itching    Pt says she can take it with benadryl.   . Iodinated Diagnostic Agents Itching    Current Outpatient Medications  Medication Sig Dispense Refill  . ALPRAZolam (XANAX) 1 MG tablet 1 mg three times and 0.5 mg per day as needed for anxiety. No more than 3.5 mg per day 105 tablet 0  . ARIPiprazole (ABILIFY) 2 MG tablet Take 1 tablet (2 mg total) by mouth daily. 90 tablet 1  . esomeprazole (NEXIUM) 40 MG capsule TAKE ONE CAPSULE BY MOUTH DAILY 30 capsule 2  . fluticasone (FLONASE) 50 MCG/ACT nasal spray Place 2 sprays into both nostrils daily as needed for up to 14 days for allergies. 1 g 1  .  Fluticasone-Salmeterol (ADVAIR DISKUS) 250-50 MCG/DOSE AEPB INHALE 1 PUFF INTO THE LUNGS 2 TIMES DAILY. 60 each 2  . gabapentin (NEURONTIN) 300 MG capsule Take 1 capsule (300 mg total) by mouth at bedtime. 30 capsule 5  . loratadine (CLARITIN) 10 MG tablet Take 1 tablet (10 mg total) by mouth daily as needed for allergies. 30 tablet 11  . mirtazapine (REMERON) 15 MG tablet Take 1 tablet (15 mg total) by mouth at bedtime. 90 tablet 1  . naproxen (NAPROSYN) 500 MG tablet TAKE 1 TABLET (500 MG TOTAL) BY MOUTH 2 (TWO) TIMES DAILY WITH A MEAL. 60 tablet 3  . omeprazole (PRILOSEC) 40 MG capsule TAKE 1 CAPSULE BY MOUTH DAILY. 30 capsule 2  . propranolol (INDERAL) 40 MG tablet Take 40 mg by mouth daily as needed (heart palpitation.).     Marland Kitchen tiZANidine (ZANAFLEX) 4 MG tablet     . [START ON 08/19/2019] venlafaxine (EFFEXOR) 75 MG tablet Take 3 tablets (225 mg total) by mouth daily. 270 tablet 0  . zolpidem (AMBIEN) 10 MG tablet Take 1 tablet (10 mg total) by mouth at bedtime as needed for sleep. 30 tablet 3   No current facility-administered medications for this visit.     OBJECTIVE: Morbidly obese African-American Good in no acute distress  Vitals:   07/18/19 1106  BP: 109/77  Pulse: 86  Resp: 17  Temp: 98 F (36.7 C)  SpO2: 97%     Body mass index is 40.97 kg/m.   Wt Readings from Last 3 Encounters:  07/18/19 238 lb 11.2 oz (108.3 kg)  06/06/19 231 lb (104.8 kg)  05/27/19 232 lb 6.4 oz (105.4 kg)  ECOG FS:1 - Symptomatic but completely ambulatory  Sclerae unicteric, EOMs intact Wearing a mask No cervical or supraclavicular adenopathy Lungs no rales or rhonchi Heart regular rate and rhythm Abd soft, obese, nontender, positive bowel sounds MSK no focal spinal tenderness, no upper extremity lymphedema Neuro: nonfocal, well oriented, appropriate affect Breasts: The right breast is status post mastectomy.  The incision is irregular but there is no evidence of local recurrence.  The left  breast is benign.  Both axillae are benign.   LAB RESULTS:  CMP     Component Value Date/Time   NA 142 07/18/2019 1034   K 3.9 07/18/2019 1034   CL 107 07/18/2019 1034   CO2 25 07/18/2019 1034   GLUCOSE 99 07/18/2019 1034   BUN 23 (H) 07/18/2019 1034   CREATININE 1.04 (H) 07/18/2019 1034   CREATININE 0.86 01/05/2019 0832   CREATININE 0.83 09/24/2016 1151  CALCIUM 9.2 07/18/2019 1034   PROT 7.2 07/18/2019 1034   ALBUMIN 3.8 07/18/2019 1034   AST 20 07/18/2019 1034   AST 14 (L) 01/05/2019 0832   ALT 27 07/18/2019 1034   ALT 19 01/05/2019 0832   ALKPHOS 86 07/18/2019 1034   BILITOT 0.4 07/18/2019 1034   BILITOT 0.3 01/05/2019 0832   GFRNONAA 59 (L) 07/18/2019 1034   GFRNONAA >60 01/05/2019 0832   GFRNONAA 79 09/24/2016 1151   GFRAA >60 07/18/2019 1034   GFRAA >60 01/05/2019 0832   GFRAA >89 09/24/2016 1151    No results found for: TOTALPROTELP, ALBUMINELP, A1GS, A2GS, BETS, BETA2SER, GAMS, MSPIKE, SPEI  No results found for: KPAFRELGTCHN, LAMBDASER, KAPLAMBRATIO  Lab Results  Component Value Date   WBC 6.2 07/18/2019   NEUTROABS 4.5 07/18/2019   HGB 11.8 (L) 07/18/2019   HCT 36.3 07/18/2019   MCV 89.0 07/18/2019   PLT 256 07/18/2019    @LASTCHEMISTRY @  No results found for: LABCA2  No components found for: [id number]  No results for input(s): INR in the last 168 hours.  No results found for: LABCA2  No results found for: WUJ811  No results found for: BJY782  No results found for: NFA213  No results found for: CA2729  No components found for: HGQUANT  No results found for: CEA1 / No results found for: CEA1   No results found for: AFPTUMOR  No results found for: CHROMOGRNA  No results found for: PSA1  Appointment on 07/18/2019  Component Date Value Ref Range Status  . Sodium 07/18/2019 142  135 - 145 mmol/L Final  . Potassium 07/18/2019 3.9  3.5 - 5.1 mmol/L Final  . Chloride 07/18/2019 107  98 - 111 mmol/L Final  . CO2 07/18/2019 25  22 -  32 mmol/L Final  . Glucose, Bld 07/18/2019 99  70 - 99 mg/dL Final  . BUN 07/18/2019 23* 6 - 20 mg/dL Final  . Creatinine, Ser 07/18/2019 1.04* 0.44 - 1.00 mg/dL Final  . Calcium 07/18/2019 9.2  8.9 - 10.3 mg/dL Final  . Total Protein 07/18/2019 7.2  6.5 - 8.1 g/dL Final  . Albumin 07/18/2019 3.8  3.5 - 5.0 g/dL Final  . AST 07/18/2019 20  15 - 41 U/L Final  . ALT 07/18/2019 27  0 - 44 U/L Final  . Alkaline Phosphatase 07/18/2019 86  38 - 126 U/L Final  . Total Bilirubin 07/18/2019 0.4  0.3 - 1.2 mg/dL Final  . GFR calc non Af Amer 07/18/2019 59* >60 mL/min Final  . GFR calc Af Amer 07/18/2019 >60  >60 mL/min Final  . Anion gap 07/18/2019 10  5 - 15 Final   Performed at [facility], [place] [address], [place], [place] [address]  . WBC 07/18/2019 6.2  4.0 - 10.5 K/uL Final  . RBC 07/18/2019 4.08  3.87 - 5.11 MIL/uL Final  . Hemoglobin 07/18/2019 11.8* 12.0 - 15.0 g/dL Final  . HCT 07/18/2019 36.3  36.0 - 46.0 % Final  . MCV 07/18/2019 89.0  80.0 - 100.0 fL Final  . MCH 07/18/2019 28.9  26.0 - 34.0 pg Final  . MCHC 07/18/2019 32.5  30.0 - 36.0 g/dL Final  . RDW 07/18/2019 14.6  11.5 - 15.5 % Final  . Platelets 07/18/2019 256  150 - 400 K/uL Final  . nRBC 07/18/2019 0.0  0.0 - 0.2 % Final  . Neutrophils Relative % 07/18/2019 74  % Final  . Neutro Abs 07/18/2019 4.5  1.7 -  7.7 K/uL Final  . Lymphocytes Relative 07/18/2019 18  % Final  . Lymphs Abs 07/18/2019 1.1  0.7 - 4.0 K/uL Final  . Monocytes Relative 07/18/2019 6  % Final  . Monocytes Absolute 07/18/2019 0.4  0.1 - 1.0 K/uL Final  . Eosinophils Relative 07/18/2019 2  % Final  . Eosinophils Absolute 07/18/2019 0.1  0.0 - 0.5 K/uL Final  . Basophils Relative 07/18/2019 0  % Final  . Basophils Absolute 07/18/2019 0.0  0.0 - 0.1 K/uL Final  . Immature Granulocytes 07/18/2019 0  % Final  . Abs Immature Granulocytes 07/18/2019 0.01  0.00 - 0.07 K/uL Final   Performed at [facility], [address] [place],  [address]    (this displays the last labs from the last 3 days)  No results found for: TOTALPROTELP, ALBUMINELP, A1GS, A2GS, BETS, BETA2SER, GAMS, MSPIKE, SPEI (this displays SPEP labs)  No results found for: KPAFRELGTCHN, LAMBDASER, KAPLAMBRATIO (kappa/lambda light chains)  No results found for: HGBA, HGBA2QUANT, HGBFQUANT, HGBSQUAN (Hemoglobinopathy evaluation)   No results found for: LDH  No results found for: IRON, TIBC, IRONPCTSAT (Iron and TIBC)  No results found for: FERRITIN  Urinalysis    Component Value Date/Time   COLORURINE CANCELED 09/24/2016 1151   APPEARANCEUR CANCELED 09/24/2016 1151   LABSPEC CANCELED 09/24/2016 1151   PHURINE CANCELED 09/24/2016 1151   GLUCOSEU CANCELED 09/24/2016 1151   HGBUR CANCELED 09/24/2016 1151   BILIRUBINUR CANCELED 09/24/2016 1151   KETONESUR CANCELED 09/24/2016 1151   PROTEINUR CANCELED 09/24/2016 1151   NITRITE CANCELED 09/24/2016 1151   LEUKOCYTESUR CANCELED 09/24/2016 1151     STUDIES:  No results found.   ELIGIBLE FOR AVAILABLE RESEARCH PROTOCOL:BCEP   ASSESSMENT: 58 y.o. Jacqueline Good status post right breast upper outer quadrant biopsy 02/25/2018 for a clinical T2 pN1, stage IIB invasive ductal carcinoma, estrogen and progesterone receptor negative, HER-2 amplified, with an MIB-1 of 50%.  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 STK11, TP53, TSC1,  TSC2, and VHL.  The following genes were evaluated for sequence changes only: SDHA and HOXB13 c.251G>A variant only.   (2) neoadjuvant chemotherapy consisting of carboplatin, docetaxel, trastuzumab and pertuzumab every 21 days x 6 starting 03/23/2018, completed 07/12/2018  (a) pertuzumab omitted after cycle 1 due to poorly-controlled diarrhea  (b) docetaxel discontinued after cycle 3 due to neuropathy; gemcitabine substituted  (c) Granix substituted for Neulasta on days 3-6 following chemotherapy due to improved tolerance  (d) Switched back to Neulasta after cycles 5 and 6 per patient request  (3) anti-HER-2 treatment continued for 12 months, last dose 03/31/2019  (a) baseline echocardiogram 03/16/2018 shows an ejection fraction in the 55-60% range.  (b) echo on 11/15 shows EF of 55-60%  (c) echo 02/18/2019 shows EF 55-60%  (4) status post right modified radical mastectomy 08/12/2018 showing a complete pathologic response (ypT0 ypN0)  (a) a total of 11 regional lymph nodes removed  (b) expander in place  (5) adjuvant radiation 11/15/2018-12/17/2018: Right chest wall 50 Gy in 25 fractions; Right CW, SCV,PAB: 50 Gy  in 25 fractions  (6) left adrenal adenoma measuring 2.9 cm on CT of the chest 03/16/2018   PLAN: Jacqueline Good is now just about a year out from definitive surgery for her breast cancer with no evidence of disease recurrence.  This is very favorable.  She is now interested in reconsidering reconstruction and I am referring her back to Dr. Elisabeth Cara with that in mind.  She is keeping appropriate pandemic precautions  Today we discussed exercise and diet issues.  She is very concerned about weight gain.  At this point she will start seeing [place] on an every 84-monthbasis.  She will be due for repeat mammography on the left side sometime in June  She knows to call for any other issue that may develop before that visit.   GVirgie Dad Fronnie Urton, MD 07/18/19 11:17 AM Medical Oncology and  Hematology [facility] [place] [address]Tel. [phone]   Fax. [phone]  I, KWilburn Mylar am acting as scribe for Dr. GVirgie Dad Naomi Fitton.  I, GLurline DelMD, have reviewed the above documentation for accuracy and completeness, and I agree with the above.

## 2019-07-18 ENCOUNTER — Inpatient Hospital Stay: Payer: Medicaid Other | Attending: Oncology

## 2019-07-18 ENCOUNTER — Other Ambulatory Visit: Payer: Self-pay

## 2019-07-18 ENCOUNTER — Inpatient Hospital Stay (HOSPITAL_BASED_OUTPATIENT_CLINIC_OR_DEPARTMENT_OTHER): Payer: Medicaid Other | Admitting: Oncology

## 2019-07-18 VITALS — BP 109/77 | HR 86 | Temp 98.0°F | Resp 17 | Ht 64.0 in | Wt 238.7 lb

## 2019-07-18 DIAGNOSIS — C50411 Malignant neoplasm of upper-outer quadrant of right female breast: Secondary | ICD-10-CM | POA: Insufficient documentation

## 2019-07-18 DIAGNOSIS — Z171 Estrogen receptor negative status [ER-]: Secondary | ICD-10-CM | POA: Diagnosis not present

## 2019-07-18 DIAGNOSIS — Z79899 Other long term (current) drug therapy: Secondary | ICD-10-CM | POA: Insufficient documentation

## 2019-07-18 DIAGNOSIS — J441 Chronic obstructive pulmonary disease with (acute) exacerbation: Secondary | ICD-10-CM

## 2019-07-18 DIAGNOSIS — J45901 Unspecified asthma with (acute) exacerbation: Secondary | ICD-10-CM

## 2019-07-18 LAB — COMPREHENSIVE METABOLIC PANEL
ALT: 27 U/L (ref 0–44)
AST: 20 U/L (ref 15–41)
Albumin: 3.8 g/dL (ref 3.5–5.0)
Alkaline Phosphatase: 86 U/L (ref 38–126)
Anion gap: 10 (ref 5–15)
BUN: 23 mg/dL — ABNORMAL HIGH (ref 6–20)
CO2: 25 mmol/L (ref 22–32)
Calcium: 9.2 mg/dL (ref 8.9–10.3)
Chloride: 107 mmol/L (ref 98–111)
Creatinine, Ser: 1.04 mg/dL — ABNORMAL HIGH (ref 0.44–1.00)
GFR calc Af Amer: >60 mL/min (ref >60)
GFR calc non Af Amer: 59 mL/min — ABNORMAL LOW (ref >60)
Glucose, Bld: 99 mg/dL (ref 70–99)
Potassium: 3.9 mmol/L (ref 3.5–5.1)
Sodium: 142 mmol/L (ref 135–145)
Total Bilirubin: 0.4 mg/dL (ref 0.3–1.2)
Total Protein: 7.2 g/dL (ref 6.5–8.1)

## 2019-07-18 LAB — CBC WITH DIFFERENTIAL/PLATELET
Abs Immature Granulocytes: 0.01 10*3/uL (ref 0.00–0.07)
Basophils Absolute: 0 10*3/uL (ref 0.0–0.1)
Basophils Relative: 0 %
Eosinophils Absolute: 0.1 10*3/uL (ref 0.0–0.5)
Eosinophils Relative: 2 %
HCT: 36.3 % (ref 36.0–46.0)
Hemoglobin: 11.8 g/dL — ABNORMAL LOW (ref 12.0–15.0)
Immature Granulocytes: 0 %
Lymphocytes Relative: 18 %
Lymphs Abs: 1.1 10*3/uL (ref 0.7–4.0)
MCH: 28.9 pg (ref 26.0–34.0)
MCHC: 32.5 g/dL (ref 30.0–36.0)
MCV: 89 fL (ref 80.0–100.0)
Monocytes Absolute: 0.4 10*3/uL (ref 0.1–1.0)
Monocytes Relative: 6 %
Neutro Abs: 4.5 10*3/uL (ref 1.7–7.7)
Neutrophils Relative %: 74 %
Platelets: 256 10*3/uL (ref 150–400)
RBC: 4.08 MIL/uL (ref 3.87–5.11)
RDW: 14.6 % (ref 11.5–15.5)
WBC: 6.2 10*3/uL (ref 4.0–10.5)
nRBC: 0 % (ref 0.0–0.2)

## 2019-07-19 ENCOUNTER — Telehealth: Payer: Self-pay | Admitting: Oncology

## 2019-07-19 NOTE — Telephone Encounter (Signed)
I talk with patient regarding schedule  

## 2019-07-26 ENCOUNTER — Other Ambulatory Visit: Payer: Self-pay

## 2019-07-26 ENCOUNTER — Ambulatory Visit (INDEPENDENT_AMBULATORY_CARE_PROVIDER_SITE_OTHER): Payer: Medicaid Other | Admitting: Plastic Surgery

## 2019-07-26 ENCOUNTER — Encounter: Payer: Self-pay | Admitting: Plastic Surgery

## 2019-07-26 VITALS — BP 131/91 | HR 67 | Temp 97.1°F | Ht 64.0 in

## 2019-07-26 DIAGNOSIS — Z9011 Acquired absence of right breast and nipple: Secondary | ICD-10-CM

## 2019-07-26 NOTE — Progress Notes (Addendum)
   Subjective:    Patient ID: Jacqueline Good, female    DOB: [birth year], 59 y.o.   MRN: [id number]  The patient is a 58 year old female here with a friend for follow-up on her right breast reconstruction.  She had a right mastectomy followed by reconstruction that required prosthetic removal due to skin breakdown.  She has a history of radiation on the right side which ended in May 2020.  She had a left mammogram a few weeks ago which came back high density but otherwise negative.  She has been doing much better and is coping with her diagnosis and body image better now.  She has significant asymmetry with the left breast being a DD cup size.  She is still interested in right breast reconstruction but understands that that would be for next year due to the radiation.  She is interested in better symmetry.     Review of Systems  Constitutional: Negative.   HENT: Negative.   Eyes: Negative.   Respiratory: Negative.   Cardiovascular: Negative.   Gastrointestinal: Negative.   Endocrine: Negative.   Genitourinary: Negative.   Musculoskeletal: Negative.        Objective:   Physical Exam Vitals and nursing note reviewed.  Constitutional:      Appearance: Normal appearance.  HENT:     Head: Normocephalic.  Eyes:     Extraocular Movements: Extraocular movements intact.  Cardiovascular:     Rate and Rhythm: Normal rate.  Pulmonary:     Effort: Pulmonary effort is normal.  Chest:    Abdominal:     General: Abdomen is flat. There is no distension.     Tenderness: There is no abdominal tenderness.  Skin:    General: Skin is warm.  Neurological:     General: No focal deficit present.     Mental Status: She is alert and oriented to person, place, and time.  Psychiatric:        Mood and Affect: Mood normal.        Behavior: Behavior normal.        Thought Content: Thought content normal.         Assessment & Plan:     ICD-10-CM   1. S/P mastectomy, right  Z90.11     The  patient is a good candidate for a left breast mastopexy reduction for symmetry.  We also discussed the possibility of a right sided latissimus midyear next year.   Pictures were obtained of the patient and placed in the chart with the patient's or guardian's permission.   The [place] was signed into law in 2016 which includes the topic of electronic health records.  This provides immediate access to information in MyChart.  This includes consultation notes, operative notes, office notes, lab results and pathology reports.  If you have any questions about what you read please let us know at your next visit or call us at the office.  We are right here with you.

## 2019-07-27 ENCOUNTER — Other Ambulatory Visit: Payer: Medicaid Other

## 2019-07-27 ENCOUNTER — Inpatient Hospital Stay (HOSPITAL_BASED_OUTPATIENT_CLINIC_OR_DEPARTMENT_OTHER): Payer: Medicaid Other | Admitting: Adult Health

## 2019-07-27 DIAGNOSIS — C50411 Malignant neoplasm of upper-outer quadrant of right female breast: Secondary | ICD-10-CM

## 2019-07-27 DIAGNOSIS — Z171 Estrogen receptor negative status [ER-]: Secondary | ICD-10-CM

## 2019-07-27 NOTE — Progress Notes (Signed)
No show.  Patient called by Thompson Caul, LPN and plans to reschedule.

## 2019-07-28 ENCOUNTER — Telehealth: Payer: Self-pay | Admitting: Adult Health

## 2019-07-28 NOTE — Telephone Encounter (Signed)
Scheduled appt per 12/16 sch message - pt aware of new appt date and time

## 2019-07-29 ENCOUNTER — Other Ambulatory Visit: Payer: Self-pay | Admitting: Adult Health

## 2019-07-29 ENCOUNTER — Other Ambulatory Visit: Payer: Self-pay | Admitting: Internal Medicine

## 2019-07-29 MED FILL — ZOLPIDEM TARTRATE 10 MG TAB: 10 | 30 days supply | Qty: 15 | Fill #2

## 2019-07-29 MED FILL — VENLAFAXINE HCL 75 MG TAB: 75 | 30 days supply | Qty: 90 | Fill #0

## 2019-07-29 MED FILL — ARIPIPRAZOLE 2 MG TABS: 2 | 30 days supply | Qty: 30 | Fill #0

## 2019-07-29 MED FILL — tiZANidine HCL 4 MG TABS: 4 | 30 days supply | Qty: 60 | Fill #1

## 2019-07-29 MED FILL — ALPRAZolam 1 MG TABS: 1 | 30 days supply | Qty: 105 | Fill #2

## 2019-08-01 MED FILL — ESOMEPRAZOLE MAG DR 40 MG C: 40 | 30 days supply | Qty: 30 | Fill #0

## 2019-08-02 MED FILL — NAPROXEN 500 MG TABS: 500 | 30 days supply | Qty: 60 | Fill #1

## 2019-08-12 HISTORY — PX: REDUCTION MAMMAPLASTY: SUR839

## 2019-08-17 ENCOUNTER — Other Ambulatory Visit: Payer: Self-pay

## 2019-08-17 ENCOUNTER — Inpatient Hospital Stay: Payer: Medicaid Other | Attending: Oncology | Admitting: Adult Health

## 2019-08-17 DIAGNOSIS — Z171 Estrogen receptor negative status [ER-]: Secondary | ICD-10-CM

## 2019-08-17 DIAGNOSIS — C50411 Malignant neoplasm of upper-outer quadrant of right female breast: Secondary | ICD-10-CM | POA: Diagnosis not present

## 2019-08-17 MED ORDER — ETODOLAC 500 MG PO TABS: 500.0000 mg | ORAL_TABLET | Freq: Two times a day (BID) | ORAL | 3 refills | Status: DC
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
## 2019-08-18 MED FILL — ADVAIR 250/50 DISKUS: 250-50 | 30 days supply | Qty: 60 | Fill #2

## 2019-08-18 MED FILL — ETODOLAC 500 MG TABS: 500 | 3 days supply | Qty: 7 | Fill #0

## 2019-08-19 ENCOUNTER — Encounter: Payer: Self-pay | Admitting: Adult Health

## 2019-08-23 ENCOUNTER — Telehealth: Payer: Self-pay

## 2019-08-23 ENCOUNTER — Other Ambulatory Visit: Payer: Self-pay | Admitting: Adult Health

## 2019-08-23 DIAGNOSIS — Z171 Estrogen receptor negative status [ER-]: Secondary | ICD-10-CM

## 2019-08-23 DIAGNOSIS — C50411 Malignant neoplasm of upper-outer quadrant of right female breast: Secondary | ICD-10-CM

## 2019-08-23 MED ORDER — MELOXICAM 15 MG PO TABS: 15.0000 mg | ORAL_TABLET | Freq: Every day | ORAL | 3 refills | Status: DC

## 2019-08-23 MED FILL — MELOXICAM 15 MG TABLET: 15 | 30 days supply | Qty: 30 | Fill #0

## 2019-08-23 NOTE — Telephone Encounter (Signed)
Left vm for patient to call back.  Etodolac 500 mg changed to Meloxicam 15 mg daily due to insurance denial. Meloxicam sent to [facility].  Center number left on vm.

## 2019-08-24 ENCOUNTER — Other Ambulatory Visit: Payer: Self-pay | Admitting: *Deleted

## 2019-08-29 ENCOUNTER — Other Ambulatory Visit: Payer: Self-pay

## 2019-08-29 ENCOUNTER — Ambulatory Visit (HOSPITAL_COMMUNITY)
Admission: RE | Admit: 2019-08-29 | Discharge: 2019-08-29 | Disposition: A | Discharge status: Home / Self Care | Payer: Medicaid Other | Source: Ambulatory Visit | Attending: Adult Health | Admitting: Adult Health

## 2019-08-29 DIAGNOSIS — Z171 Estrogen receptor negative status [ER-]: Secondary | ICD-10-CM | POA: Insufficient documentation

## 2019-08-29 DIAGNOSIS — C50411 Malignant neoplasm of upper-outer quadrant of right female breast: Secondary | ICD-10-CM | POA: Diagnosis present

## 2019-08-29 MED FILL — ARIPIPRAZOLE 2 MG TABS: 2 | 30 days supply | Qty: 30 | Fill #1

## 2019-08-29 MED FILL — ZOLPIDEM TARTRATE 10 MG TAB: 10 | 30 days supply | Qty: 15 | Fill #3

## 2019-08-29 MED FILL — ALPRAZolam 1 MG TABS: 1 | 30 days supply | Qty: 105 | Fill #3

## 2019-08-30 ENCOUNTER — Telehealth: Payer: Self-pay

## 2019-08-30 NOTE — Telephone Encounter (Signed)
-----   Message from Gardenia Phlegm, NP sent at 08/30/2019  9:25 AM EST ----- Xrays show degeneration.  Recommend she continue NSAIDS (which I think she is taking either Naproxen or Meloxicam).  If that isn't helping, would recommend referral to Dr. Maryjean Ka in pain clinic to further evaluate for the cause and better treatment of her pain.  Let me know if she wants a referral.  Thanks,  [place] ----- Message ----- From: Interface, Rad Results In Sent: 08/29/2019   1:33 PM EST To: Gardenia Phlegm, NP

## 2019-08-30 NOTE — Telephone Encounter (Signed)
Spoke with patient to inform of x-ray results.  NP recommended to continue taking NSAID'S for leg/hip pain.  If pain continues NP can refer patient to Dr. Maryjean Ka in pain clinic.  Patient wishes to hold off a while longer because she has surgery and dental issues at this time to deal with.  Nurse told patient if she decides to go to Dr. Maryjean Ka to call us back and we can put in a referral.  Patient voiced understanding and thanks for call.

## 2019-08-31 NOTE — Telephone Encounter (Signed)
No entry 

## 2019-09-07 MED FILL — ESOMEPRAZOLE MAG DR 40 MG C: 40 | 30 days supply | Qty: 30 | Fill #1

## 2019-09-09 ENCOUNTER — Telehealth: Payer: Self-pay

## 2019-09-09 ENCOUNTER — Other Ambulatory Visit: Payer: Self-pay | Admitting: Adult Health

## 2019-09-09 DIAGNOSIS — Z171 Estrogen receptor negative status [ER-]: Secondary | ICD-10-CM

## 2019-09-09 DIAGNOSIS — G609 Hereditary and idiopathic neuropathy, unspecified: Secondary | ICD-10-CM

## 2019-09-09 DIAGNOSIS — C50411 Malignant neoplasm of upper-outer quadrant of right female breast: Secondary | ICD-10-CM

## 2019-09-09 MED ORDER — NAPROXEN 500 MG PO TABS: 500.0000 mg | ORAL_TABLET | Freq: Two times a day (BID) | ORAL | 2 refills | Status: DC

## 2019-09-09 MED FILL — NAPROXEN 500 MG TABS: 500 | 30 days supply | Qty: 60 | Fill #0

## 2019-09-09 NOTE — Telephone Encounter (Signed)
Rx for Naprosyn 500 mg 2 x daily sent in to [facility].

## 2019-09-09 NOTE — Telephone Encounter (Signed)
-----   Message from Gardenia Phlegm, NP sent at 09/08/2019  5:27 PM EST ----- Regarding: RE: Pain meds Yes that is fine.  Advise that she see Dr. Lovenia Shuck to help with her pain.  He may be able to do a nerve block or something that would be more helpful ----- Message ----- From: Juliane Poot, LPN Sent: [id number]   3:45 PM EST To: Gardenia Phlegm, NP Subject: Pain meds                                      Patient called regarding pain meds that were prescribed her (one is etodolac).  She says that neither are helping with her pain.  Patient asks if she can go back on Naprosyn 500 mg tablets instead because she feels like she is getting relief when taking vs. The other meds.  Please advise.  Thanks, Cecille Rubin

## 2019-09-13 ENCOUNTER — Other Ambulatory Visit: Payer: Self-pay

## 2019-09-13 ENCOUNTER — Ambulatory Visit (INDEPENDENT_AMBULATORY_CARE_PROVIDER_SITE_OTHER): Payer: Medicaid Other | Admitting: Plastic Surgery

## 2019-09-13 ENCOUNTER — Encounter: Payer: Self-pay | Admitting: Plastic Surgery

## 2019-09-13 DIAGNOSIS — N6489 Other specified disorders of breast: Secondary | ICD-10-CM

## 2019-09-13 MED ORDER — HYDROCODONE-ACETAMINOPHEN 5-325 MG PO TABS: 1.0000 | ORAL_TABLET | Freq: Two times a day (BID) | ORAL | 0 refills | Status: AC | PRN

## 2019-09-13 MED ORDER — CEPHALEXIN 500 MG PO CAPS: 500.0000 mg | ORAL_CAPSULE | Freq: Four times a day (QID) | ORAL | 0 refills | Status: AC

## 2019-09-13 MED FILL — CEPHALEXIN 500 MG CAPSULE: 500 | 3 days supply | Qty: 12 | Fill #0

## 2019-09-13 MED FILL — HYDROCODON-APAP 5-325: 5-325 | 7 days supply | Qty: 10 | Fill #0

## 2019-09-13 NOTE — Progress Notes (Addendum)
ICD-10-CM   1. Postoperative breast asymmetry  N64.89       Patient ID: Jacqueline Good, female    DOB: [birth year], 60 y.o.   MRN: [id number]   History of Present Illness: Jacqueline Good is a 59 y.o.  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  She presents for preoperative evaluation for upcoming procedure, left breast reduction, scheduled for 09/28/2019 with Dr. Marla Roe.  Patient had a right mastectomy that required removal due to skin breakdown.  History of radiation on the right side completed in May 2020.  Mammogram on 04/13/2019 showed high density but otherwise negative.  Significant asymmetry with the left breast being a D / DD cup size.  Would like to pursue right breast reconstruction but understands it would be sometime mid year due to the radiation,discussed on previous visit possible latissimus flap.  She would like to be a full C cup. Would be ok if slightly smaller.  She does not want to be a D cup.  Patient has a personal and family history of breast cancer, family history of blood clots.  She quit smoking in 2019.  Denies history of diabetes and hypertension.  The patient has not had problems with anesthesia.   Past Medical History: Allergies: Allergies  Allergen Reactions  . Gadolinium Derivatives Itching and Cough    Pt began sneezing and coughing as soon as MRI contrast was injected. Severe nasal congestion. No rash or hives no SOB.   Marland Kitchen Hydrocodone Hives and Itching    Pt says she can take it with benadryl.   . Iodinated Diagnostic Agents Itching    Current Medications:  Current Outpatient Medications:  .  ALPRAZolam (XANAX) 1 MG tablet, 1 mg three times and 0.5 mg per day as needed for anxiety. No more than 3.5 mg per day, Disp: 105 tablet, Rfl: 0 .  ARIPiprazole (ABILIFY) 2 MG tablet, Take 1 tablet (2 mg total) by mouth daily., Disp: 90 tablet, Rfl: 1 .  cephALEXin (KEFLEX) 500 MG capsule, Take 1 capsule (500 mg  total) by mouth 4 (four) times daily for 3 days., Disp: 12 capsule, Rfl: 0 .  esomeprazole (NEXIUM) 40 MG capsule, TAKE 1 CAPSULE BY MOUTH ONCE DAILY, Disp: 30 capsule, Rfl: 2 .  etodolac (LODINE) 500 MG tablet, Take 1 tablet (500 mg total) by mouth 2 (two) times daily. (Patient not taking: Reported on 09/13/2019), Disp: 60 tablet, Rfl: 3 .  fluticasone (FLONASE) 50 MCG/ACT nasal spray, Place 2 sprays into both nostrils daily as needed for up to 14 days for allergies., Disp: 1 g, Rfl: 1 .  Fluticasone-Salmeterol (ADVAIR DISKUS) 250-50 MCG/DOSE AEPB, INHALE 1 PUFF INTO THE LUNGS 2 TIMES DAILY., Disp: 60 each, Rfl: 2 .  HYDROcodone-acetaminophen (NORCO) 5-325 MG tablet, Take 1 tablet by mouth every 12 (twelve) hours as needed for up to 5 days for severe pain., Disp: 10 tablet, Rfl: 0 .  loratadine (CLARITIN) 10 MG tablet, Take 1 tablet (10 mg total) by mouth daily as needed for allergies., Disp: 30 tablet, Rfl: 11 .  meloxicam (MOBIC) 15 MG tablet, Take 1 tablet (15 mg total) by mouth daily. (Patient not taking: Reported on 09/13/2019), Disp: 30 tablet, Rfl: 3 .  mirtazapine (REMERON) 15 MG tablet, Take 1 tablet (15 mg total) by mouth at bedtime., Disp: 90 tablet, Rfl: 1 .  naproxen (NAPROSYN) 500 MG tablet, Take 1 tablet (500 mg total) by mouth 2 (two)  times daily with a meal., Disp: 60 tablet, Rfl: 2 .  omeprazole (PRILOSEC) 40 MG capsule, TAKE 1 CAPSULE BY MOUTH DAILY., Disp: 30 capsule, Rfl: 2 .  propranolol (INDERAL) 40 MG tablet, Take 40 mg by mouth daily as needed (heart palpitation.). , Disp: , Rfl:  .  tiZANidine (ZANAFLEX) 4 MG tablet, , Disp: , Rfl:  .  venlafaxine (EFFEXOR) 75 MG tablet, Take 3 tablets (225 mg total) by mouth daily., Disp: 270 tablet, Rfl: 0 .  zolpidem (AMBIEN) 10 MG tablet, Take 1 tablet (10 mg total) by mouth at bedtime as needed for sleep., Disp: 30 tablet, Rfl: 3  Past Medical Problems: Past Medical History:  Diagnosis Date  . Anemia   . Anxiety   . Arthritis    "all  over" (08/12/2018)  . Asthma   . Breast cancer, right breast ([place]) dx'd 02/2018  . Chronic bronchitis ([place])   . Depression   . Family history of breast cancer   . Family history of prostate cancer   . Family history of uterine cancer   . GERD (gastroesophageal reflux disease)   . History of blood transfusion    "several; related to low blood" (08/12/2018)  . History of radiation therapy 11/15/18- 12/17/18   Right Chest wall, [facility]CV, PAB. 25 fractions.     Past Surgical History: Past Surgical History:  Procedure Laterality Date  . AXILLARY LYMPH NODE DISSECTION Right 08/12/2018   Procedure: AXILLARY LYMPH NODE DISSECTION;  Surgeon: Alphonsa Overall, MD;  Location: [facility];  Service: General;  Laterality: Right;  . BREAST BIOPSY Right 03/2018  . BREAST RECONSTRUCTION WITH PLACEMENT OF TISSUE EXPANDER AND FLEX HD (ACELLULAR HYDRATED DERMIS) Right 08/12/2018   Procedure: RIGHT BREAST RECONSTRUCTION WITH PLACEMENT OF TISSUE EXPANDER AND FLEX HD (ACELLULAR HYDRATED DERMIS);  Surgeon: Wallace Going, DO;  Location: [place];  Service: Plastics;  Laterality: Right;  . DILATION AND CURETTAGE OF UTERUS    . ENDOMETRIAL ABLATION    . IR IMAGING GUIDED PORT INSERTION  03/18/2018  . MASTECTOMY MODIFIED RADICAL Right 08/12/2018   w/axillary LND  . MASTECTOMY MODIFIED RADICAL Right 08/12/2018   Procedure: RIGHT MODIFIED RADICAL MASTECTOMY;  Surgeon: Alphonsa Overall, MD;  Location: [place];  Service: General;  Laterality: Right;  . MYOMECTOMY    . REMOVAL OF TISSUE EXPANDER AND PLACEMENT OF IMPLANT Right 10/26/2018   Procedure: Removal of right breast expander;  Surgeon: Wallace Going, DO;  Location: [place];  Service: Plastics;  Laterality: Right;  75 min, please    Social History: Social History   Socioeconomic History  . Marital status: Single    Spouse name: Not on file  . Number of children: Not on file  . Years of education: Not on file  . Highest education level: Not on file  Occupational History  .  Not on file  Tobacco Use  . Smoking status: Former Smoker    Packs/day: 1.00    Years: 38.00    Pack years: 38.00    Types: Cigarettes    Quit date: 05/15/2018    Years since quitting: 1.3  . Smokeless tobacco: Never Used  Substance and Sexual Activity  . Alcohol use: Not Currently    Comment: 08/12/2018 "nothing since 04/2018"  . Drug use: No  . Sexual activity: Not Currently    Birth control/protection: None  Other Topics Concern  . Not on file  Social History Narrative  . Not on file   Social Determinants of Health  Financial Resource Strain:   . Difficulty of Paying Living Expenses: Not on file  Food Insecurity:   . Worried About Charity fundraiser in the Last Year: Not on file  . Ran Out of Food in the Last Year: Not on file  Transportation Needs:   . Lack of Transportation (Medical): Not on file  . Lack of Transportation (Non-Medical): Not on file  Physical Activity:   . Days of Exercise per Week: Not on file  . Minutes of Exercise per Session: Not on file  Stress:   . Feeling of Stress : Not on file  Social Connections:   . Frequency of Communication with Friends and Family: Not on file  . Frequency of Social Gatherings with Friends and Family: Not on file  . Attends Religious Services: Not on file  . Active Member of Clubs or Organizations: Not on file  . Attends Archivist Meetings: Not on file  . Marital Status: Not on file  Intimate Partner Violence:   . Fear of Current or Ex-Partner: Not on file  . Emotionally Abused: Not on file  . Physically Abused: Not on file  . Sexually Abused: Not on file     Family History: Family History  Problem Relation Age of Onset  . Lupus Sister   . Heart disease Sister   . Breast cancer Mother 12  . Prostate cancer Paternal Uncle   . Diabetes Maternal Grandmother   . Heart Problems Maternal Grandmother   . Prostate cancer Maternal Grandfather   . Prostate cancer Paternal Grandfather   . Prostate cancer  Other        MGFs brother  . Breast cancer Other        Mat great-grandmother's sister  . Uterine cancer Maternal Great-grandmother   . Prostate cancer Other        PGFs brother    Review of Systems: Review of Systems  Constitutional: Negative for chills and fever.  HENT: Negative for congestion and sore throat.   Respiratory: Negative for cough and shortness of breath.   Cardiovascular: Negative for chest pain and palpitations.  Gastrointestinal: Negative for abdominal pain, nausea and vomiting.  Skin: Negative for itching and rash.    Physical Exam: Vital Signs BP 121/81 (BP Location: Right Arm, Patient Position: Sitting, Cuff Size: Large)   Pulse 61   Temp 98.2 F (36.8 C) (Temporal)   Ht 5\' 5"  (1.651 m)   Wt 228 lb (103.4 kg)   SpO2 98%   BMI 37.94 kg/m  Physical Exam Vitals and nursing note reviewed. Exam conducted with a chaperone present.  Constitutional:      Appearance: Normal appearance. She is normal weight.  HENT:     Head: Normocephalic and atraumatic.  Eyes:     Extraocular Movements: Extraocular movements intact.  Cardiovascular:     Rate and Rhythm: Normal rate and regular rhythm.     Pulses: Normal pulses.     Heart sounds: Normal heart sounds.  Pulmonary:     Effort: Pulmonary effort is normal.     Breath sounds: Normal breath sounds. No wheezing, rhonchi or rales.  Abdominal:     General: Bowel sounds are normal.     Palpations: Abdomen is soft.  Musculoskeletal:        General: No swelling. Normal range of motion.     Cervical back: Normal range of motion.  Skin:    General: Skin is warm and dry.     Coloration: Skin  is not jaundiced or pale.     Findings: No bruising, erythema, lesion or rash.     Comments: Incision of right breast healed well. Acquired absence of right breast. Hypertrophic scaring of port incision above left breast.   Neurological:     General: No focal deficit present.     Mental Status: She is alert and oriented to  person, place, and time.  Psychiatric:        Mood and Affect: Mood normal.        Behavior: Behavior normal.        Thought Content: Thought content normal.        Judgment: Judgment normal.     Assessment/Plan:  Ms. Riolo scheduled for left breast reduction/mastopexy for symmetry with Dr. Marla Roe.  She would like to be a C cup. Risks, benefits, and alternatives of procedure discussed, questions answered and consent obtained.  She quit smoking in 2019.  Cabrini score: > 5 High Risk. Total risk factors 11.  Risks include 59 year old female, BMI greater than 25, personal and family history of breast cancer, family history of blood clots, length of planned surgery.  Recommendation for mechanical and pharmacological prophylaxis.  Postop medications ordered for Keflex and hydrocodone. Patient declined Zofran. Patient reports she is able to take hydrocodone, as she takes benadryl with it for the itching.  Cautioned patient about sedation effects of hydrocodone.  Explained risks of combining sedating medications such as Xanax, Ambien, Benadryl, and hydrocodone.  Patient verbalized understanding of risks and to watch out for excessive sedation as well as to walk frequently to reduce risk of blood clots.  The risk that can be encountered with breast reduction were discussed and include the following but not limited to these:  Breast asymmetry, fluid accumulation, firmness of the breast, inability to breast feed, loss of nipple or areola, skin loss, decrease or no nipple sensation, fat necrosis of the breast tissue, bleeding, infection, healing delay.  There are risks of anesthesia, changes to skin sensation and injury to nerves or blood vessels.  The muscle can be temporarily or permanently injured.  You may have an allergic reaction to tape, suture, glue, blood products which can result in skin discoloration, swelling, pain, skin lesions, poor healing.  Any of these can lead to the need for revisonal  surgery or stage procedures.  A reduction has potential to interfere with diagnostic procedures.  Nipple or breast piercing can increase risks of infection.  This procedure is best done when the breast is fully developed.  Changes in the breast will continue to occur over time.  Pregnancy can alter the outcomes of previous breast reduction surgery, weight gain and weigh loss can also effect the long term appearance.   The [place] was signed into law in 2016 which includes the topic of electronic health records.  This provides immediate access to information in MyChart.  This includes consultation notes, operative notes, office notes, lab results and pathology reports.  If you have any questions about what you read please let us know at your next visit or call us at the office.  We are right here with you.   Electronically signed by: Threasa Heads, PA-C 09/14/2019 8:47 AM

## 2019-09-13 NOTE — H&P (View-Only) (Signed)
ICD-10-CM   1. Postoperative breast asymmetry  N64.89       Patient ID: [name], female    DOB: 1961/05/23, 59 y.o.   MRN: [id number]   History of Present Illness: CHENOA WARNECKE is a 59 y.o.  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  She presents for preoperative evaluation for upcoming procedure, left breast reduction, scheduled for 09/28/2019 with Dr. Marla Roe.  Patient had a right mastectomy that required removal due to skin breakdown.  History of radiation on the right side completed in May 2020.  Mammogram on 04/13/2019 showed high density but otherwise negative.  Significant asymmetry with the left breast being a D / DD cup size.  Would like to pursue right breast reconstruction but understands it would be sometime mid year due to the radiation,discussed on previous visit possible latissimus flap.  She would like to be a full C cup. Would be ok if slightly smaller.  She does not want to be a D cup.  Patient has a personal and family history of breast cancer, family history of blood clots.  She quit smoking in 2019.  Denies history of diabetes and hypertension.  The patient has not had problems with anesthesia.   Past Medical History: Allergies: Allergies  Allergen Reactions  . Gadolinium Derivatives Itching and Cough    Pt began sneezing and coughing as soon as MRI contrast was injected. Severe nasal congestion. No rash or hives no SOB.   Marland Kitchen Hydrocodone Hives and Itching    Pt says she can take it with benadryl.   . Iodinated Diagnostic Agents Itching    Current Medications:  Current Outpatient Medications:  .  ALPRAZolam (XANAX) 1 MG tablet, 1 mg three times and 0.5 mg per day as needed for anxiety. No more than 3.5 mg per day, Disp: 105 tablet, Rfl: 0 .  ARIPiprazole (ABILIFY) 2 MG tablet, Take 1 tablet (2 mg total) by mouth daily., Disp: 90 tablet, Rfl: 1 .  cephALEXin (KEFLEX) 500 MG capsule, Take 1 capsule (500 mg  total) by mouth 4 (four) times daily for 3 days., Disp: 12 capsule, Rfl: 0 .  esomeprazole (NEXIUM) 40 MG capsule, TAKE 1 CAPSULE BY MOUTH ONCE DAILY, Disp: 30 capsule, Rfl: 2 .  etodolac (LODINE) 500 MG tablet, Take 1 tablet (500 mg total) by mouth 2 (two) times daily. (Patient not taking: Reported on 09/13/2019), Disp: 60 tablet, Rfl: 3 .  fluticasone (FLONASE) 50 MCG/ACT nasal spray, Place 2 sprays into both nostrils daily as needed for up to 14 days for allergies., Disp: 1 g, Rfl: 1 .  Fluticasone-Salmeterol (ADVAIR DISKUS) 250-50 MCG/DOSE AEPB, INHALE 1 PUFF INTO THE LUNGS 2 TIMES DAILY., Disp: 60 each, Rfl: 2 .  HYDROcodone-acetaminophen (NORCO) 5-325 MG tablet, Take 1 tablet by mouth every 12 (twelve) hours as needed for up to 5 days for severe pain., Disp: 10 tablet, Rfl: 0 .  loratadine (CLARITIN) 10 MG tablet, Take 1 tablet (10 mg total) by mouth daily as needed for allergies., Disp: 30 tablet, Rfl: 11 .  meloxicam (MOBIC) 15 MG tablet, Take 1 tablet (15 mg total) by mouth daily. (Patient not taking: Reported on 09/13/2019), Disp: 30 tablet, Rfl: 3 .  mirtazapine (REMERON) 15 MG tablet, Take 1 tablet (15 mg total) by mouth at bedtime., Disp: 90 tablet, Rfl: 1 .  naproxen (NAPROSYN) 500 MG tablet, Take 1 tablet (500 mg total) by mouth 2 (two)  times daily with a meal., Disp: 60 tablet, Rfl: 2 .  omeprazole (PRILOSEC) 40 MG capsule, TAKE 1 CAPSULE BY MOUTH DAILY., Disp: 30 capsule, Rfl: 2 .  propranolol (INDERAL) 40 MG tablet, Take 40 mg by mouth daily as needed (heart palpitation.). , Disp: , Rfl:  .  tiZANidine (ZANAFLEX) 4 MG tablet, , Disp: , Rfl:  .  venlafaxine (EFFEXOR) 75 MG tablet, Take 3 tablets (225 mg total) by mouth daily., Disp: 270 tablet, Rfl: 0 .  zolpidem (AMBIEN) 10 MG tablet, Take 1 tablet (10 mg total) by mouth at bedtime as needed for sleep., Disp: 30 tablet, Rfl: 3  Past Medical Problems: Past Medical History:  Diagnosis Date  . Anemia   . Anxiety   . Arthritis    "all  over" (08/12/2018)  . Asthma   . Breast cancer, right breast ([facility]) dx'd 02/2018  . Chronic bronchitis ([place])   . Depression   . Family history of breast cancer   . Family history of prostate cancer   . Family history of uterine cancer   . GERD (gastroesophageal reflux disease)   . History of blood transfusion    "several; related to low blood" (08/12/2018)  . History of radiation therapy 11/15/18- 12/17/18   Right Chest wall, SCV, PAB. 25 fractions.     Past Surgical History: Past Surgical History:  Procedure Laterality Date  . AXILLARY LYMPH NODE DISSECTION Right 08/12/2018   Procedure: AXILLARY LYMPH NODE DISSECTION;  Surgeon: Alphonsa Overall, MD;  Location: [facility];  Service: General;  Laterality: Right;  . BREAST BIOPSY Right 03/2018  . BREAST RECONSTRUCTION WITH PLACEMENT OF TISSUE EXPANDER AND FLEX HD (ACELLULAR HYDRATED DERMIS) Right 08/12/2018   Procedure: RIGHT BREAST RECONSTRUCTION WITH PLACEMENT OF TISSUE EXPANDER AND FLEX HD (ACELLULAR HYDRATED DERMIS);  Surgeon: Wallace Going, DO;  Location: [facility];  Service: Plastics;  Laterality: Right;  . DILATION AND CURETTAGE OF UTERUS    . ENDOMETRIAL ABLATION    . IR IMAGING GUIDED PORT INSERTION  03/18/2018  . MASTECTOMY MODIFIED RADICAL Right 08/12/2018   w/axillary LND  . MASTECTOMY MODIFIED RADICAL Right 08/12/2018   Procedure: RIGHT MODIFIED RADICAL MASTECTOMY;  Surgeon: Alphonsa Overall, MD;  Location: [facility];  Service: General;  Laterality: Right;  . MYOMECTOMY    . REMOVAL OF TISSUE EXPANDER AND PLACEMENT OF IMPLANT Right 10/26/2018   Procedure: Removal of right breast expander;  Surgeon: Wallace Going, DO;  Location: [facility];  Service: Plastics;  Laterality: Right;  75 min, please    Social History: Social History   Socioeconomic History  . Marital status: Single    Spouse name: Not on file  . Number of children: Not on file  . Years of education: Not on file  . Highest education level: Not on file  Occupational History  .  Not on file  Tobacco Use  . Smoking status: Former Smoker    Packs/day: 1.00    Years: 38.00    Pack years: 38.00    Types: Cigarettes    Quit date: 05/15/2018    Years since quitting: 1.3  . Smokeless tobacco: Never Used  Substance and Sexual Activity  . Alcohol use: Not Currently    Comment: 08/12/2018 "nothing since 04/2018"  . Drug use: No  . Sexual activity: Not Currently    Birth control/protection: None  Other Topics Concern  . Not on file  Social History Narrative  . Not on file   Social Determinants of Health  Financial Resource Strain:   . Difficulty of Paying Living Expenses: Not on file  Food Insecurity:   . Worried About Charity fundraiser in the Last Year: Not on file  . Ran Out of Food in the Last Year: Not on file  Transportation Needs:   . Lack of Transportation (Medical): Not on file  . Lack of Transportation (Non-Medical): Not on file  Physical Activity:   . Days of Exercise per Week: Not on file  . Minutes of Exercise per Session: Not on file  Stress:   . Feeling of Stress : Not on file  Social Connections:   . Frequency of Communication with Friends and Family: Not on file  . Frequency of Social Gatherings with Friends and Family: Not on file  . Attends Religious Services: Not on file  . Active Member of Clubs or Organizations: Not on file  . Attends Archivist Meetings: Not on file  . Marital Status: Not on file  Intimate Partner Violence:   . Fear of Current or Ex-Partner: Not on file  . Emotionally Abused: Not on file  . Physically Abused: Not on file  . Sexually Abused: Not on file     Family History: Family History  Problem Relation Age of Onset  . Lupus Sister   . Heart disease Sister   . Breast cancer Mother 49  . Prostate cancer Paternal Uncle   . Diabetes Maternal Grandmother   . Heart Problems Maternal Grandmother   . Prostate cancer Maternal Grandfather   . Prostate cancer Paternal Grandfather   . Prostate cancer  Other        MGFs brother  . Breast cancer Other        Mat great-grandmother's sister  . Uterine cancer Maternal Great-grandmother   . Prostate cancer Other        PGFs brother    Review of Systems: Review of Systems  Constitutional: Negative for chills and fever.  HENT: Negative for congestion and sore throat.   Respiratory: Negative for cough and shortness of breath.   Cardiovascular: Negative for chest pain and palpitations.  Gastrointestinal: Negative for abdominal pain, nausea and vomiting.  Skin: Negative for itching and rash.    Physical Exam: Vital Signs BP 121/81 (BP Location: Right Arm, Patient Position: Sitting, Cuff Size: Large)   Pulse 61   Temp 98.2 F (36.8 C) (Temporal)   Ht 5\' 5"  (1.651 m)   Wt 228 lb (103.4 kg)   SpO2 98%   BMI 37.94 kg/m  Physical Exam Vitals and nursing note reviewed. Exam conducted with a chaperone present.  Constitutional:      Appearance: Normal appearance. She is normal weight.  HENT:     Head: Normocephalic and atraumatic.  Eyes:     Extraocular Movements: Extraocular movements intact.  Cardiovascular:     Rate and Rhythm: Normal rate and regular rhythm.     Pulses: Normal pulses.     Heart sounds: Normal heart sounds.  Pulmonary:     Effort: Pulmonary effort is normal.     Breath sounds: Normal breath sounds. No wheezing, rhonchi or rales.  Abdominal:     General: Bowel sounds are normal.     Palpations: Abdomen is soft.  Musculoskeletal:        General: No swelling. Normal range of motion.     Cervical back: Normal range of motion.  Skin:    General: Skin is warm and dry.     Coloration: Skin  is not jaundiced or pale.     Findings: No bruising, erythema, lesion or rash.     Comments: Incision of right breast healed well. Acquired absence of right breast. Hypertrophic scaring of port incision above left breast.   Neurological:     General: No focal deficit present.     Mental Status: She is alert and oriented to  person, place, and time.  Psychiatric:        Mood and Affect: Mood normal.        Behavior: Behavior normal.        Thought Content: Thought content normal.        Judgment: Judgment normal.     Assessment/Plan:  Ms. Rehagen scheduled for left breast reduction/mastopexy for symmetry with Dr. Marla Roe.  She would like to be a C cup. Risks, benefits, and alternatives of procedure discussed, questions answered and consent obtained.  She quit smoking in 2019.  Cabrini score: > 5 High Risk. Total risk factors 11.  Risks include 59 year old female, BMI greater than 25, personal and family history of breast cancer, family history of blood clots, length of planned surgery.  Recommendation for mechanical and pharmacological prophylaxis.  Postop medications ordered for Keflex and hydrocodone. Patient declined Zofran. Patient reports she is able to take hydrocodone, as she takes benadryl with it for the itching.  Cautioned patient about sedation effects of hydrocodone.  Explained risks of combining sedating medications such as Xanax, Ambien, Benadryl, and hydrocodone.  Patient verbalized understanding of risks and to watch out for excessive sedation as well as to walk frequently to reduce risk of blood clots.  The risk that can be encountered with breast reduction were discussed and include the following but not limited to these:  Breast asymmetry, fluid accumulation, firmness of the breast, inability to breast feed, loss of nipple or areola, skin loss, decrease or no nipple sensation, fat necrosis of the breast tissue, bleeding, infection, healing delay.  There are risks of anesthesia, changes to skin sensation and injury to nerves or blood vessels.  The muscle can be temporarily or permanently injured.  You may have an allergic reaction to tape, suture, glue, blood products which can result in skin discoloration, swelling, pain, skin lesions, poor healing.  Any of these can lead to the need for revisonal  surgery or stage procedures.  A reduction has potential to interfere with diagnostic procedures.  Nipple or breast piercing can increase risks of infection.  This procedure is best done when the breast is fully developed.  Changes in the breast will continue to occur over time.  Pregnancy can alter the outcomes of previous breast reduction surgery, weight gain and weigh loss can also effect the long term appearance.   The [place] was signed into law in 2016 which includes the topic of electronic health records.  This provides immediate access to information in MyChart.  This includes consultation notes, operative notes, office notes, lab results and pathology reports.  If you have any questions about what you read please let us know at your next visit or call us at the office.  We are right here with you.   Electronically signed by: Threasa Heads, PA-C 09/14/2019 8:47 AM

## 2019-09-16 NOTE — Telephone Encounter (Signed)
Opened in error

## 2019-09-20 ENCOUNTER — Other Ambulatory Visit: Payer: Self-pay

## 2019-09-20 ENCOUNTER — Ambulatory Visit: Payer: Medicaid Other | Admitting: Psychiatry

## 2019-09-21 ENCOUNTER — Encounter: Payer: Self-pay | Admitting: Psychiatry

## 2019-09-21 ENCOUNTER — Ambulatory Visit (INDEPENDENT_AMBULATORY_CARE_PROVIDER_SITE_OTHER): Payer: Medicaid Other | Admitting: Psychiatry

## 2019-09-21 ENCOUNTER — Other Ambulatory Visit: Payer: Self-pay

## 2019-09-21 DIAGNOSIS — F411 Generalized anxiety disorder: Secondary | ICD-10-CM

## 2019-09-21 DIAGNOSIS — G4700 Insomnia, unspecified: Secondary | ICD-10-CM

## 2019-09-21 DIAGNOSIS — F3341 Major depressive disorder, recurrent, in partial remission: Secondary | ICD-10-CM

## 2019-09-21 MED ORDER — MIRTAZAPINE 15 MG PO TABS: 15.0000 mg | ORAL_TABLET | Freq: Every day | ORAL | 1 refills | Status: DC

## 2019-09-21 MED ORDER — ZOLPIDEM TARTRATE 10 MG PO TABS: 10.0000 mg | ORAL_TABLET | Freq: Every evening | ORAL | 2 refills | Status: DC | PRN

## 2019-09-21 MED ORDER — VENLAFAXINE HCL 75 MG PO TABS: 225.0000 mg | ORAL_TABLET | Freq: Every day | ORAL | 0 refills | Status: DC

## 2019-09-21 MED ORDER — ARIPIPRAZOLE 2 MG PO TABS: 2.0000 mg | ORAL_TABLET | Freq: Every day | ORAL | 1 refills | Status: DC

## 2019-09-21 MED FILL — VENLAFAXINE HCL 75 MG TAB: 75 | 90 days supply | Qty: 270 | Fill #0

## 2019-09-21 MED FILL — MIRTAZAPINE 15 MG TABLET: 15 | 90 days supply | Qty: 90 | Fill #0

## 2019-09-21 MED FILL — ARIPIPRAZOLE 2 MG TABS: 2 | 90 days supply | Qty: 90 | Fill #0

## 2019-09-21 NOTE — Progress Notes (Signed)
[facility] MD OP Progress Note  I connected with  Jacqueline Good on 09/21/19 by a video enabled telemedicine application and verified that I am speaking with the correct person using two identifiers.   I discussed the limitations of evaluation and management by telemedicine. The patient expressed understanding and agreed to proceed.    09/21/2019 3:09 PM Jacqueline Good  MRN:  [id number]  Chief Complaint: " I am the same."  HPI: She stated that her mood has been the same.  She is still struggling with surgery after her breast cancer treatment.  She has another procedure scheduled for next week.  She stated that she is thankful that she is alive and better than she was however she is tired of dealing with all the surgical procedures.  She informed that her sister has been taking care of her.  She stated that she is appreciative of her sisters help however she is very worried about her sister coming over to her place.  She explained that her sister also takes care of her grandson and other sick family members and has to run around a lot.  Patient is worried that this increases her sisters risk of contracting COVID-19 and then transmitting it to others. Patient was suggested to keep her mask on and also have her sister keep her mask on when they meet.  She complained that she only gets 15 tablets of Ambien every month instead of 30 and asked if she could get for 30 tablets as she cannot sleep without it.  Patient was explained that Medicaid only covers 15 tablets/month of Ambien despite [place] writing a 30-day prescription. Patient stated that she will see if she can pay for the remaining 30 tablets out-of-pocket depending on the price.  Visit Diagnosis:    ICD-10-CM   1. MDD (major depressive disorder), recurrent, in partial remission ([facility])  F33.41   2. Generalized anxiety disorder  F41.1   3. Insomnia, unspecified type  G47.00     Past Psychiatric History: MDD, GAD, Insomnia  Past Medical History:   Past Medical History:  Diagnosis Date  . Anemia   . Anxiety   . Arthritis    "all over" (08/12/2018)  . Asthma   . Breast cancer, right breast ([facility]) dx'd 02/2018  . Chronic bronchitis ([facility])   . Depression   . Family history of breast cancer   . Family history of prostate cancer   . Family history of uterine cancer   . GERD (gastroesophageal reflux disease)   . History of blood transfusion    "several; related to low blood" (08/12/2018)  . History of radiation therapy 11/15/18- 12/17/18   Right Chest wall, SCV, PAB. 25 fractions.     Past Surgical History:  Procedure Laterality Date  . AXILLARY LYMPH NODE DISSECTION Right 08/12/2018   Procedure: AXILLARY LYMPH NODE DISSECTION;  Surgeon: Alphonsa Overall, MD;  Location: [facility];  Service: General;  Laterality: Right;  . BREAST BIOPSY Right 03/2018  . BREAST RECONSTRUCTION WITH PLACEMENT OF TISSUE EXPANDER AND FLEX HD (ACELLULAR HYDRATED DERMIS) Right 08/12/2018   Procedure: RIGHT BREAST RECONSTRUCTION WITH PLACEMENT OF TISSUE EXPANDER AND FLEX HD (ACELLULAR HYDRATED DERMIS);  Surgeon: Wallace Going, DO;  Location: [facility];  Service: Plastics;  Laterality: Right;  . DILATION AND CURETTAGE OF UTERUS    . ENDOMETRIAL ABLATION    . IR IMAGING GUIDED PORT INSERTION  03/18/2018  . MASTECTOMY MODIFIED RADICAL Right 08/12/2018   w/axillary LND  . MASTECTOMY MODIFIED  RADICAL Right 08/12/2018   Procedure: RIGHT MODIFIED RADICAL MASTECTOMY;  Surgeon: Alphonsa Overall, MD;  Location: [facility];  Service: General;  Laterality: Right;  . MYOMECTOMY    . REMOVAL OF TISSUE EXPANDER AND PLACEMENT OF IMPLANT Right 10/26/2018   Procedure: Removal of right breast expander;  Surgeon: Wallace Going, DO;  Location: [facility];  Service: Plastics;  Laterality: Right;  75 min, please    Family Psychiatric History: denied  Family History:  Family History  Problem Relation Age of Onset  . Lupus Sister   . Heart disease Sister   . Breast cancer Mother 15  . Prostate  cancer Paternal Uncle   . Diabetes Maternal Grandmother   . Heart Problems Maternal Grandmother   . Prostate cancer Maternal Grandfather   . Prostate cancer Paternal Grandfather   . Prostate cancer Other        MGFs brother  . Breast cancer Other        Mat great-grandmother's sister  . Uterine cancer Maternal Great-grandmother   . Prostate cancer Other        PGFs brother    Social History:  Social History   Socioeconomic History  . Marital status: Single    Spouse name: Not on file  . Number of children: Not on file  . Years of education: Not on file  . Highest education level: Not on file  Occupational History  . Not on file  Tobacco Use  . Smoking status: Former Smoker    Packs/day: 1.00    Years: 38.00    Pack years: 38.00    Types: Cigarettes    Quit date: 05/15/2018    Years since quitting: 1.3  . Smokeless tobacco: Never Used  Substance and Sexual Activity  . Alcohol use: Not Currently    Comment: 08/12/2018 "nothing since 04/2018"  . Drug use: No  . Sexual activity: Not Currently    Birth control/protection: None  Other Topics Concern  . Not on file  Social History Narrative  . Not on file   Social Determinants of Health   Financial Resource Strain:   . Difficulty of Paying Living Expenses: Not on file  Food Insecurity:   . Worried About Charity fundraiser in the Last Year: Not on file  . Ran Out of Food in the Last Year: Not on file  Transportation Needs:   . Lack of Transportation (Medical): Not on file  . Lack of Transportation (Non-Medical): Not on file  Physical Activity:   . Days of Exercise per Week: Not on file  . Minutes of Exercise per Session: Not on file  Stress:   . Feeling of Stress : Not on file  Social Connections:   . Frequency of Communication with Friends and Family: Not on file  . Frequency of Social Gatherings with Friends and Family: Not on file  . Attends Religious Services: Not on file  . Active Member of Clubs or  Organizations: Not on file  . Attends Archivist Meetings: Not on file  . Marital Status: Not on file    Allergies:  Allergies  Allergen Reactions  . Gadolinium Derivatives Itching and Cough    Pt began sneezing and coughing as soon as MRI contrast was injected. Severe nasal congestion. No rash or hives no SOB.   Marland Kitchen Hydrocodone Hives and Itching    Pt says she can take it with benadryl.   . Iodinated Diagnostic Agents Itching    Metabolic Disorder Labs:  Lab Results  Component Value Date   HGBA1C 5.4 04/27/2014   No results found for: PROLACTIN Lab Results  Component Value Date   CHOL 201 (H) 02/18/2019   TRIG 115 02/18/2019   HDL 63 02/18/2019   CHOLHDL 3.2 02/18/2019   VLDL 23 02/18/2019   LDLCALC 115 (H) 02/18/2019   LDLCALC 76 09/24/2016   Lab Results  Component Value Date   TSH 0.58 09/24/2016   TSH 0.696 04/27/2014    Therapeutic Level Labs: No results found for: LITHIUM No results found for: VALPROATE No components found for:  CBMZ  Current Medications: Current Outpatient Medications  Medication Sig Dispense Refill  . ALPRAZolam (XANAX) 1 MG tablet 1 mg three times and 0.5 mg per day as needed for anxiety. No more than 3.5 mg per day 105 tablet 0  . ARIPiprazole (ABILIFY) 2 MG tablet Take 1 tablet (2 mg total) by mouth daily. 90 tablet 1  . esomeprazole (NEXIUM) 40 MG capsule TAKE 1 CAPSULE BY MOUTH ONCE DAILY 30 capsule 2  . etodolac (LODINE) 500 MG tablet Take 1 tablet (500 mg total) by mouth 2 (two) times daily. (Patient not taking: Reported on 09/13/2019) 60 tablet 3  . fluticasone (FLONASE) 50 MCG/ACT nasal spray Place 2 sprays into both nostrils daily as needed for up to 14 days for allergies. 1 g 1  . Fluticasone-Salmeterol (ADVAIR DISKUS) 250-50 MCG/DOSE AEPB INHALE 1 PUFF INTO THE LUNGS 2 TIMES DAILY. 60 each 2  . loratadine (CLARITIN) 10 MG tablet Take 1 tablet (10 mg total) by mouth daily as needed for allergies. 30 tablet 11  . meloxicam  (MOBIC) 15 MG tablet Take 1 tablet (15 mg total) by mouth daily. (Patient not taking: Reported on 09/13/2019) 30 tablet 3  . mirtazapine (REMERON) 15 MG tablet Take 1 tablet (15 mg total) by mouth at bedtime. 90 tablet 1  . naproxen (NAPROSYN) 500 MG tablet Take 1 tablet (500 mg total) by mouth 2 (two) times daily with a meal. 60 tablet 2  . omeprazole (PRILOSEC) 40 MG capsule TAKE 1 CAPSULE BY MOUTH DAILY. 30 capsule 2  . propranolol (INDERAL) 40 MG tablet Take 40 mg by mouth daily as needed (heart palpitation.).     Marland Kitchen tiZANidine (ZANAFLEX) 4 MG tablet     . venlafaxine (EFFEXOR) 75 MG tablet Take 3 tablets (225 mg total) by mouth daily. 270 tablet 0  . zolpidem (AMBIEN) 10 MG tablet Take 1 tablet (10 mg total) by mouth at bedtime as needed for sleep. 30 tablet 3   No current facility-administered medications for this visit.        Psychiatric Specialty Exam: Review of Systems  There were no vitals taken for this visit.There is no height or weight on file to calculate BMI.  General Appearance: Fairly Groomed  Eye Contact:  Fair  Speech:  Clear and Coherent and Normal Rate  Volume:  Normal  Mood:  Slightly irritable  Affect:  Congruent  Thought Process:  Goal Directed, Linear and Descriptions of Associations: Intact  Orientation:  Full (Time, Place, and Person)  Thought Content: Logical   Suicidal Thoughts:  No  Homicidal Thoughts:  No  Memory:  Recent;   Good Remote;   Good  Judgement:  Good  Insight:  Good  Psychomotor Activity:  Normal  Concentration:  Concentration: Good and Attention Span: Good  Recall:  Good  Fund of Knowledge: Good  Language: Good  Akathisia:  Negative  Handed:  Right  AIMS (if  indicated): not done  Assets:  Communication Skills Desire for Improvement Financial Resources/Insurance Housing  ADL's:  Intact  Cognition: WNL  Sleep:  Fair, sleeps better with help of ambien    Screenings: GAD-7     Office Visit from 04/22/2018 in [facility] Office Visit from 06/03/2017 in [facility] Office Visit from 09/24/2016 in [facility] Office Visit from 03/27/2016 in [facility]  Total GAD-7 Score  11  3  15  16     PHQ2-9     Office Visit from 04/22/2018 in [facility] Office Visit from 06/03/2017 in [facility] Office Visit from 09/24/2016 in [facility] Office Visit from 05/01/2016 in [facility] Office Visit from 03/27/2016 in [facility]  PHQ-2 Total Score  5  0  2  5  2   PHQ-9 Total Score  15  4  7  8  10        Assessment and Plan: 59 year old female with history of MDD, GAD, insomnia now undergoing treatment for breast carcinoma.  She has a surgical procedure scheduled for next week and patient is feeling overwhelmed with all the treatment.  She asked if she could get full 30 tabs of Ambien instead of 15 as without Ambien she has a hard time with sleep.  1. MDD (major depressive disorder), recurrent, in partial remission ([facility])  - venlafaxine (EFFEXOR) 75 MG tablet; Take 3 tablets (225 mg total) by mouth daily.  Dispense: 270 tablet; Refill: 0 - ARIPiprazole (ABILIFY) 2 MG tablet; Take 1 tablet (2 mg total) by mouth daily.  Dispense: 90 tablet; Refill: 1 - mirtazapine (REMERON) 15 MG tablet; Take 1 tablet (15 mg total) by mouth at bedtime.  Dispense: 90 tablet; Refill: 1  2. Generalized anxiety disorder  - venlafaxine (EFFEXOR) 75 MG tablet; Take 3 tablets (225 mg total) by mouth daily.  Dispense: 270 tablet; Refill: 0 - mirtazapine (REMERON) 15 MG tablet; Take 1 tablet (15 mg total) by mouth at bedtime.  Dispense: 90 tablet; Refill: 1  3. Insomnia, unspecified type  - zolpidem (AMBIEN) 10 MG tablet; Take 1 tablet (10 mg total) by mouth at bedtime as needed for sleep.   Dispense: 30 tablet; Refill: 2  Continue same medication regimen. Follow up in 3 months.  [place] Crane, MD 09/21/2019, 3:09 PM

## 2019-09-22 ENCOUNTER — Other Ambulatory Visit: Payer: Self-pay

## 2019-09-22 ENCOUNTER — Other Ambulatory Visit: Payer: Medicaid Other

## 2019-09-22 ENCOUNTER — Encounter (HOSPITAL_BASED_OUTPATIENT_CLINIC_OR_DEPARTMENT_OTHER): Payer: Self-pay | Admitting: Plastic Surgery

## 2019-09-22 ENCOUNTER — Telehealth (HOSPITAL_COMMUNITY): Payer: Self-pay | Admitting: *Deleted

## 2019-09-22 MED ORDER — ALPRAZOLAM 1 MG PO TABS: ORAL_TABLET | ORAL | 1 refills | Status: DC

## 2019-09-22 MED FILL — HYDROCODON-APAP 5-325: 5-325 | 7 days supply | Qty: 10 | Fill #0

## 2019-09-22 MED FILL — tiZANidine HCL 4 MG TABS: 4 | 30 days supply | Qty: 60 | Fill #2

## 2019-09-22 MED FILL — CEPHALEXIN 500 MG CAPSULE: 500 | 3 days supply | Qty: 12 | Fill #0

## 2019-09-22 NOTE — Telephone Encounter (Signed)
REFILL REQUEST [facility]   LAST APPT 09/21/19 ALPRAZolam (XANAX) 1 MG tablet 105 tablet  1 mg three times and 0.5 mg per day as needed for anxiety. No more than 3.5 mg per day

## 2019-09-22 NOTE — Addendum Note (Signed)
Addended by: [place] Crane on: 09/22/2019 08:27 AM   Modules accepted: Orders

## 2019-09-22 NOTE — Telephone Encounter (Signed)
Prescription sent

## 2019-09-24 ENCOUNTER — Other Ambulatory Visit (HOSPITAL_COMMUNITY)
Admission: RE | Admit: 2019-09-24 | Discharge: 2019-09-24 | Disposition: A | Discharge status: Home / Self Care | Payer: Medicaid Other | Source: Ambulatory Visit | Attending: Plastic Surgery | Admitting: Plastic Surgery

## 2019-09-24 DIAGNOSIS — Z20822 Contact with and (suspected) exposure to covid-19: Secondary | ICD-10-CM | POA: Diagnosis not present

## 2019-09-24 DIAGNOSIS — Z01812 Encounter for preprocedural laboratory examination: Secondary | ICD-10-CM | POA: Diagnosis present

## 2019-09-24 LAB — SARS CORONAVIRUS 2 (TAT 6-24 HRS): SARS Coronavirus 2: NEGATIVE

## 2019-09-26 ENCOUNTER — Telehealth: Payer: Self-pay | Admitting: Adult Health

## 2019-09-26 MED FILL — ZOLPIDEM TARTRATE 10 MG TAB: 10 | 15 days supply | Qty: 15 | Fill #0

## 2019-09-26 MED FILL — ALPRAZolam 1 MG TABS: 1 | 30 days supply | Qty: 105 | Fill #0

## 2019-09-26 MED FILL — ZOLPIDEM TARTRATE 10 MG TAB: 10 | 15 days supply | Qty: 15 | Fill #1

## 2019-09-26 NOTE — Telephone Encounter (Signed)
Rescheduled per provider. Called and spoke with pt, confirmed 3/12 appt

## 2019-09-28 ENCOUNTER — Encounter (HOSPITAL_BASED_OUTPATIENT_CLINIC_OR_DEPARTMENT_OTHER): Payer: Self-pay | Admitting: Plastic Surgery

## 2019-09-28 ENCOUNTER — Ambulatory Visit (HOSPITAL_BASED_OUTPATIENT_CLINIC_OR_DEPARTMENT_OTHER)
Admission: RE | Admit: 2019-09-28 | Discharge: 2019-09-28 | Disposition: A | Discharge status: Home / Self Care | Payer: Medicaid Other | Source: Ambulatory Visit | Attending: Plastic Surgery | Admitting: Plastic Surgery

## 2019-09-28 ENCOUNTER — Other Ambulatory Visit: Payer: Self-pay

## 2019-09-28 ENCOUNTER — Ambulatory Visit (HOSPITAL_BASED_OUTPATIENT_CLINIC_OR_DEPARTMENT_OTHER): Payer: Medicaid Other | Admitting: Anesthesiology

## 2019-09-28 ENCOUNTER — Encounter (HOSPITAL_BASED_OUTPATIENT_CLINIC_OR_DEPARTMENT_OTHER)
Admission: RE | Disposition: A | Discharge status: Home / Self Care | Payer: Self-pay | Source: Ambulatory Visit | Attending: Plastic Surgery

## 2019-09-28 DIAGNOSIS — Z9011 Acquired absence of right breast and nipple: Secondary | ICD-10-CM | POA: Diagnosis not present

## 2019-09-28 DIAGNOSIS — Z87891 Personal history of nicotine dependence: Secondary | ICD-10-CM | POA: Diagnosis not present

## 2019-09-28 DIAGNOSIS — Z803 Family history of malignant neoplasm of breast: Secondary | ICD-10-CM | POA: Diagnosis not present

## 2019-09-28 DIAGNOSIS — Z79899 Other long term (current) drug therapy: Secondary | ICD-10-CM | POA: Insufficient documentation

## 2019-09-28 DIAGNOSIS — K219 Gastro-esophageal reflux disease without esophagitis: Secondary | ICD-10-CM | POA: Insufficient documentation

## 2019-09-28 DIAGNOSIS — M549 Dorsalgia, unspecified: Secondary | ICD-10-CM | POA: Insufficient documentation

## 2019-09-28 DIAGNOSIS — Z7951 Long term (current) use of inhaled steroids: Secondary | ICD-10-CM | POA: Insufficient documentation

## 2019-09-28 DIAGNOSIS — F419 Anxiety disorder, unspecified: Secondary | ICD-10-CM | POA: Diagnosis not present

## 2019-09-28 DIAGNOSIS — Z923 Personal history of irradiation: Secondary | ICD-10-CM | POA: Diagnosis not present

## 2019-09-28 DIAGNOSIS — F329 Major depressive disorder, single episode, unspecified: Secondary | ICD-10-CM | POA: Insufficient documentation

## 2019-09-28 DIAGNOSIS — N6489 Other specified disorders of breast: Secondary | ICD-10-CM | POA: Diagnosis present

## 2019-09-28 DIAGNOSIS — N62 Hypertrophy of breast: Secondary | ICD-10-CM | POA: Insufficient documentation

## 2019-09-28 DIAGNOSIS — J449 Chronic obstructive pulmonary disease, unspecified: Secondary | ICD-10-CM | POA: Insufficient documentation

## 2019-09-28 DIAGNOSIS — M542 Cervicalgia: Secondary | ICD-10-CM | POA: Insufficient documentation

## 2019-09-28 HISTORY — PX: BREAST REDUCTION SURGERY: SHX8

## 2019-09-28 SURGERY — MAMMOPLASTY, REDUCTION
Anesthesia: General | Site: Breast | Laterality: Left

## 2019-09-28 MED ORDER — LIDOCAINE HCL (CARDIAC) PF 100 MG/5ML IV SOSY
PREFILLED_SYRINGE | INTRAVENOUS | Status: DC | PRN
  Administered 2019-09-28: 60 mg via INTRAVENOUS

## 2019-09-28 MED ORDER — EPHEDRINE 5 MG/ML INJ
INTRAVENOUS | Status: AC
  Filled 2019-09-28: qty 10

## 2019-09-28 MED ORDER — PHENYLEPHRINE HCL (PRESSORS) 10 MG/ML IV SOLN
INTRAVENOUS | Status: AC
  Filled 2019-09-28: qty 1

## 2019-09-28 MED ORDER — FENTANYL CITRATE (PF) 100 MCG/2ML IJ SOLN
INTRAMUSCULAR | Status: DC | PRN
  Administered 2019-09-28: 100 ug via INTRAVENOUS

## 2019-09-28 MED ORDER — PHENYLEPHRINE HCL (PRESSORS) 10 MG/ML IV SOLN
INTRAVENOUS | Status: DC | PRN
  Administered 2019-09-28: 80 ug via INTRAVENOUS

## 2019-09-28 MED ORDER — PROPOFOL 500 MG/50ML IV EMUL
INTRAVENOUS | Status: AC
  Filled 2019-09-28: qty 50

## 2019-09-28 MED ORDER — SUGAMMADEX SODIUM 200 MG/2ML IV SOLN
INTRAVENOUS | Status: DC | PRN
  Administered 2019-09-28: 200 mg via INTRAVENOUS

## 2019-09-28 MED ORDER — CHLORHEXIDINE GLUCONATE CLOTH 2 % EX PADS: 6.0000 | MEDICATED_PAD | Freq: Once | CUTANEOUS | Status: DC

## 2019-09-28 MED ORDER — SUGAMMADEX SODIUM 500 MG/5ML IV SOLN
INTRAVENOUS | Status: AC
  Filled 2019-09-28: qty 5

## 2019-09-28 MED ORDER — ONDANSETRON HCL 4 MG/2ML IJ SOLN
INTRAMUSCULAR | Status: DC | PRN
  Administered 2019-09-28: 4 mg via INTRAVENOUS

## 2019-09-28 MED ORDER — PHENYLEPHRINE HCL-NACL 10-0.9 MG/250ML-% IV SOLN: INTRAVENOUS | Status: DC | PRN

## 2019-09-28 MED ORDER — LIDOCAINE-EPINEPHRINE 1 %-1:100000 IJ SOLN
INTRAMUSCULAR | Status: DC | PRN
  Administered 2019-09-28: 30 mL

## 2019-09-28 MED ORDER — ONDANSETRON HCL 4 MG/2ML IJ SOLN
INTRAMUSCULAR | Status: AC
  Filled 2019-09-28: qty 2

## 2019-09-28 MED ORDER — FENTANYL CITRATE (PF) 100 MCG/2ML IJ SOLN
25.0000 ug | INTRAMUSCULAR | Status: DC | PRN
  Administered 2019-09-28 (×2): 50 ug via INTRAVENOUS

## 2019-09-28 MED ORDER — ACETAMINOPHEN 500 MG PO TABS
1000.0000 mg | ORAL_TABLET | Freq: Once | ORAL | Status: AC
  Administered 2019-09-28: 1000 mg via ORAL

## 2019-09-28 MED ORDER — EPHEDRINE SULFATE 50 MG/ML IJ SOLN
INTRAMUSCULAR | Status: DC | PRN
  Administered 2019-09-28: 15 mg via INTRAVENOUS

## 2019-09-28 MED ORDER — MIDAZOLAM HCL 5 MG/5ML IJ SOLN
INTRAMUSCULAR | Status: DC | PRN
  Administered 2019-09-28: 2 mg via INTRAVENOUS

## 2019-09-28 MED ORDER — LIDOCAINE 2% (20 MG/ML) 5 ML SYRINGE
INTRAMUSCULAR | Status: AC
  Filled 2019-09-28: qty 5

## 2019-09-28 MED ORDER — ROCURONIUM BROMIDE 100 MG/10ML IV SOLN
INTRAVENOUS | Status: DC | PRN
  Administered 2019-09-28: 80 mg via INTRAVENOUS

## 2019-09-28 MED ORDER — PROPOFOL 10 MG/ML IV BOLUS
INTRAVENOUS | Status: DC | PRN
  Administered 2019-09-28: 200 mg via INTRAVENOUS

## 2019-09-28 MED ORDER — ROCURONIUM BROMIDE 10 MG/ML (PF) SYRINGE
PREFILLED_SYRINGE | INTRAVENOUS | Status: AC
  Filled 2019-09-28: qty 10

## 2019-09-28 MED ORDER — LACTATED RINGERS IV SOLN: INTRAVENOUS | Status: DC

## 2019-09-28 MED ORDER — FENTANYL CITRATE (PF) 100 MCG/2ML IJ SOLN
INTRAMUSCULAR | Status: AC
  Filled 2019-09-28: qty 2

## 2019-09-28 MED ORDER — DEXAMETHASONE SODIUM PHOSPHATE 4 MG/ML IJ SOLN
INTRAMUSCULAR | Status: DC | PRN
  Administered 2019-09-28: 5 mg via INTRAVENOUS

## 2019-09-28 MED ORDER — PHENYLEPHRINE 40 MCG/ML (10ML) SYRINGE FOR IV PUSH (FOR BLOOD PRESSURE SUPPORT)
PREFILLED_SYRINGE | INTRAVENOUS | Status: AC
  Filled 2019-09-28: qty 10

## 2019-09-28 MED ORDER — CEFAZOLIN SODIUM-DEXTROSE 2-4 GM/100ML-% IV SOLN
2.0000 g | INTRAVENOUS | Status: AC
  Administered 2019-09-28: 2 g via INTRAVENOUS

## 2019-09-28 MED ORDER — DEXAMETHASONE SODIUM PHOSPHATE 10 MG/ML IJ SOLN
INTRAMUSCULAR | Status: AC
  Filled 2019-09-28: qty 1

## 2019-09-28 MED ORDER — SUCCINYLCHOLINE CHLORIDE 200 MG/10ML IV SOSY
PREFILLED_SYRINGE | INTRAVENOUS | Status: AC
  Filled 2019-09-28: qty 10

## 2019-09-28 MED ORDER — ACETAMINOPHEN 500 MG PO TABS
ORAL_TABLET | ORAL | Status: AC
  Filled 2019-09-28: qty 2

## 2019-09-28 MED ORDER — MIDAZOLAM HCL 2 MG/2ML IJ SOLN
INTRAMUSCULAR | Status: AC
  Filled 2019-09-28: qty 2

## 2019-09-28 MED ORDER — CEFAZOLIN SODIUM-DEXTROSE 2-4 GM/100ML-% IV SOLN
INTRAVENOUS | Status: AC
  Filled 2019-09-28: qty 100

## 2019-09-28 SURGICAL SUPPLY — 65 items
ADH SKN CLS APL DERMABOND .7 (GAUZE/BANDAGES/DRESSINGS) ×1
BAG DECANTER FOR FLEXI CONT (MISCELLANEOUS) ×1 IMPLANT
BINDER BREAST LRG (GAUZE/BANDAGES/DRESSINGS) IMPLANT
BINDER BREAST MEDIUM (GAUZE/BANDAGES/DRESSINGS) IMPLANT
BINDER BREAST XLRG (GAUZE/BANDAGES/DRESSINGS) IMPLANT
BINDER BREAST XXLRG (GAUZE/BANDAGES/DRESSINGS) ×1 IMPLANT
BIOPATCH RED 1 DISK 7.0 (GAUZE/BANDAGES/DRESSINGS) IMPLANT
BLADE HEX COATED 2.75 (ELECTRODE) ×2 IMPLANT
BLADE KNIFE PERSONA 10 (BLADE) ×4 IMPLANT
BLADE SURG 15 STRL LF DISP TIS (BLADE) IMPLANT
BLADE SURG 15 STRL SS (BLADE)
CANISTER SUCT 1200ML W/VALVE (MISCELLANEOUS) ×2 IMPLANT
COVER BACK TABLE 60X90IN (DRAPES) ×2 IMPLANT
COVER MAYO STAND STRL (DRAPES) ×2 IMPLANT
COVER WAND RF STERILE (DRAPES) IMPLANT
DECANTER SPIKE VIAL GLASS SM (MISCELLANEOUS) IMPLANT
DERMABOND ADVANCED (GAUZE/BANDAGES/DRESSINGS) ×1
DERMABOND ADVANCED .7 DNX12 (GAUZE/BANDAGES/DRESSINGS) IMPLANT
DRAIN CHANNEL 19F RND (DRAIN) IMPLANT
DRAPE LAPAROSCOPIC ABDOMINAL (DRAPES) ×2 IMPLANT
DRSG PAD ABDOMINAL 8X10 ST (GAUZE/BANDAGES/DRESSINGS) ×4 IMPLANT
ELECT BLADE 4.0 EZ CLEAN MEGAD (MISCELLANEOUS) ×2
ELECT REM PT RETURN 9FT ADLT (ELECTROSURGICAL) ×2
ELECTRODE BLDE 4.0 EZ CLN MEGD (MISCELLANEOUS) IMPLANT
ELECTRODE REM PT RTRN 9FT ADLT (ELECTROSURGICAL) ×1 IMPLANT
EVACUATOR SILICONE 100CC (DRAIN) IMPLANT
GAUZE SPONGE 4X4 12PLY STRL LF (GAUZE/BANDAGES/DRESSINGS) IMPLANT
GLOVE BIO SURGEON STRL SZ 6.5 (GLOVE) ×6 IMPLANT
GLOVE BIO SURGEON STRL SZ7 (GLOVE) ×2 IMPLANT
GOWN STRL REUS W/ TWL LRG LVL3 (GOWN DISPOSABLE) ×3 IMPLANT
GOWN STRL REUS W/ TWL XL LVL3 (GOWN DISPOSABLE) IMPLANT
GOWN STRL REUS W/TWL LRG LVL3 (GOWN DISPOSABLE) ×6
GOWN STRL REUS W/TWL XL LVL3 (GOWN DISPOSABLE) ×2
NDL HYPO 25X1 1.5 SAFETY (NEEDLE) ×1 IMPLANT
NDL SAFETY ECLIPSE 18X1.5 (NEEDLE) IMPLANT
NEEDLE HYPO 18GX1.5 SHARP (NEEDLE)
NEEDLE HYPO 25X1 1.5 SAFETY (NEEDLE) ×2 IMPLANT
NS IRRIG 1000ML POUR BTL (IV SOLUTION) ×1 IMPLANT
PACK BASIN DAY SURGERY FS (CUSTOM PROCEDURE TRAY) ×2 IMPLANT
PAD ALCOHOL SWAB (MISCELLANEOUS) IMPLANT
PENCIL SMOKE EVACUATOR (MISCELLANEOUS) ×2 IMPLANT
PIN SAFETY STERILE (MISCELLANEOUS) IMPLANT
SLEEVE SCD COMPRESS KNEE MED (MISCELLANEOUS) ×2 IMPLANT
SPONGE LAP 18X18 RF (DISPOSABLE) ×4 IMPLANT
STRIP SUTURE WOUND CLOSURE 1/2 (MISCELLANEOUS) ×4 IMPLANT
SUT MNCRL AB 4-0 PS2 18 (SUTURE) ×6 IMPLANT
SUT MON AB 3-0 SH 27 (SUTURE) ×4
SUT MON AB 3-0 SH27 (SUTURE) ×3 IMPLANT
SUT MON AB 5-0 PS2 18 (SUTURE) ×5 IMPLANT
SUT PDS 3-0 CT2 (SUTURE)
SUT PDS AB 2-0 CT2 27 (SUTURE) IMPLANT
SUT PDS II 3-0 CT2 27 ABS (SUTURE) IMPLANT
SUT SILK 3 0 PS 1 (SUTURE) IMPLANT
SYR 3ML 23GX1 SAFETY (SYRINGE) IMPLANT
SYR 50ML LL SCALE MARK (SYRINGE) IMPLANT
SYR BULB IRRIGATION 50ML (SYRINGE) ×2 IMPLANT
SYR CONTROL 10ML LL (SYRINGE) ×2 IMPLANT
TAPE MEASURE VINYL STERILE (MISCELLANEOUS) IMPLANT
TOWEL GREEN STERILE FF (TOWEL DISPOSABLE) ×4 IMPLANT
TRAY DSU PREP LF (CUSTOM PROCEDURE TRAY) ×1 IMPLANT
TUBE CONNECTING 20X1/4 (TUBING) ×2 IMPLANT
TUBING INFILTRATION IT-10001 (TUBING) IMPLANT
TUBING SET GRADUATE ASPIR 12FT (MISCELLANEOUS) IMPLANT
UNDERPAD 30X36 HEAVY ABSORB (UNDERPADS AND DIAPERS) ×4 IMPLANT
YANKAUER SUCT BULB TIP NO VENT (SUCTIONS) ×2 IMPLANT

## 2019-09-28 NOTE — Op Note (Signed)
Breast Reduction Op note:    DATE OF PROCEDURE: 09/28/2019  LOCATION: [place]  SURGEON: Lyndee Leo Sanger Daylah Sayavong, DO  ASSISTANT: Phoebe Sharps, PA  PREOPERATIVE DIAGNOSIS 1. Breast asymmetry after treatment for breast cancer  2. Macromastia 3. Neck Pain / Back Pain  POSTOPERATIVE DIAGNOSIS Same  PROCEDURES 1. Left breast reduction.   [id number]  g  COMPLICATIONS: None.  DRAINS: none  INDICATIONS FOR PROCEDURE Jacqueline Good is a 59 y.o. year-old female born on 1960-08-15,with a history of breast cancer.  She was treated with a right mastectomy.  She now has severe asymmetry.  She also complains of symptomatic macromastia with concominant back pain, neck pain, shoulder grooving from her bra.  This is made worse with the asymmetry. MRN: [id number]  CONSENT Informed consent was obtained directly from the patient. The risks, benefits and alternatives were fully discussed. Specific risks including but not limited to bleeding, infection, hematoma, seroma, scarring, pain, nipple necrosis, asymmetry, poor cosmetic results, and need for further surgery were discussed. The patient had ample opportunity to have her questions answered to her satisfaction.  DESCRIPTION OF PROCEDURE  Patient was brought into the operating room and placed in a supine position.  SCDs were placed and appropriate padding was performed.  Antibiotics were given. The patient underwent general anesthesia and the chest was prepped and draped in a sterile fashion.  A timeout was performed and all information was confirmed to be correct.  Left side: Preoperative markings were confirmed.  Incision lines were injected with 1% Xylocaine with epinephrine.  After waiting for vasoconstriction, the marked lines were incised.  A Wise-pattern superomedial breast reduction was performed by de-epithelializing the pedicle, using bovie to create the superomedial pedicle, and removing breast tissue from the  superior, lateral, and inferior portions of the breast.  Care was taken to not undermine the breast pedicle. Hemostasis was achieved.  The nipple was gently rotated into position and the soft tissue was closed with 4-0 Monocryl.  The patient was sat upright and size and shape symmetry was confirmed.  The pocket was irrigated and hemostasis confirmed.  The deep tissues were approximated with 3-0 monocryl sutures and the skin was closed with deep dermal and subcuticular 4-0 Monocryl sutures.  Dermabond was applied.  A breast binder and ABDs were placed.  The nipple and skin flaps had good capillary refill at the end of the procedure.  The patient tolerated the procedure well. The patient was allowed to wake from anesthesia and taken to the recovery room in satisfactory condition  The advanced practice practitioner (APP) assisted throughout the case.  The APP was essential in retraction and counter traction when needed to make the case progress smoothly.  This retraction and assistance made it possible to see the tissue plans for the procedure.  The assistance was needed for blood control, tissue re-approximation and assisted with closure of the incision site.   The [place] was signed into law in 2016 which includes the topic of electronic health records.  This provides immediate access to information in MyChart.  This includes consultation notes, operative notes, office notes, lab results and pathology reports.  If you have any questions about what you read please let us know at your next visit or call us at the office.  We are right here with you.

## 2019-09-28 NOTE — Transfer of Care (Signed)
Immediate Anesthesia Transfer of Care Note  Patient: Jacqueline Good  Procedure(s) Performed: LEFT MAMMARY REDUCTION/MASTOPEXY  (BREAST) (Left Breast)  Patient Location: PACU  Anesthesia Type:General  Level of Consciousness: awake, alert  and oriented  Airway & Oxygen Therapy: Patient Spontanous Breathing and Patient connected to face mask oxygen  Post-op Assessment: Report given to RN and Post -op Vital signs reviewed and stable  Post vital signs: Reviewed and stable  Last Vitals:  Vitals Value Taken Time  BP    Temp    Pulse 82 09/28/19 1100  Resp 18 09/28/19 1100  SpO2 100 % 09/28/19 1100  Vitals shown include unvalidated device data.  Last Pain:  Vitals:   09/28/19 0818  TempSrc: Oral  PainSc: 0-No pain         Complications: No apparent anesthesia complications

## 2019-09-28 NOTE — Interval H&P Note (Signed)
History and Physical Interval Note:  09/28/2019 9:03 AM  Jacqueline Good  has presented today for surgery, with the diagnosis of post mastectomy; history of breast cancer.  The various methods of treatment have been discussed with the patient and family. After consideration of risks, benefits and other options for treatment, the patient has consented to  Procedure(s): MAMMARY REDUCTION  (BREAST) (Left) as a surgical intervention.  The patient's history has been reviewed, patient examined, no change in status, stable for surgery.  I have reviewed the patient's chart and labs.  Questions were answered to the patient's satisfaction.     Loel Lofty Andilynn Delavega

## 2019-09-28 NOTE — Anesthesia Postprocedure Evaluation (Signed)
Anesthesia Post Note  Patient: Jacqueline Good  Procedure(s) Performed: LEFT MAMMARY REDUCTION/MASTOPEXY  (BREAST) (Left Breast)     Patient location during evaluation: PACU Anesthesia Type: General Level of consciousness: awake and alert Pain management: pain level controlled Vital Signs Assessment: post-procedure vital signs reviewed and stable Respiratory status: spontaneous breathing, nonlabored ventilation, respiratory function stable and patient connected to nasal cannula oxygen Cardiovascular status: blood pressure returned to baseline and stable Postop Assessment: no apparent nausea or vomiting Anesthetic complications: no    Last Vitals:  Vitals:   09/28/19 1115 09/28/19 1130  BP: (!) 155/71 (!) 146/81  Pulse: 70 69  Resp: 18 17  Temp:    SpO2: 99% 99%    Last Pain:  Vitals:   09/28/19 1130  TempSrc:   PainSc: 7                  Chelsey L Woodrum

## 2019-09-28 NOTE — Anesthesia Procedure Notes (Signed)
Procedure Name: Intubation Date/Time: 09/28/2019 9:27 AM Performed by: Willa Frater, CRNA Pre-anesthesia Checklist: Patient identified, Emergency Drugs available, Suction available and Patient being monitored Patient Re-evaluated:Patient Re-evaluated prior to induction Oxygen Delivery Method: Circle system utilized Preoxygenation: Pre-oxygenation with 100% oxygen Induction Type: IV induction Ventilation: Mask ventilation without difficulty Laryngoscope Size: Mac and 3 Grade View: Grade I Tube type: Oral Number of attempts: 1 Airway Equipment and Method: Stylet and Oral airway Placement Confirmation: ETT inserted through vocal cords under direct vision,  positive ETCO2 and breath sounds checked- equal and bilateral Secured at: 23 cm Tube secured with: Tape Dental Injury: Teeth and Oropharynx as per pre-operative assessment

## 2019-09-28 NOTE — Anesthesia Preprocedure Evaluation (Addendum)
Anesthesia Evaluation  Patient identified by MRN, date of birth, ID band Patient awake    Reviewed: Allergy & Precautions, NPO status , Patient's Chart, lab work & pertinent test results, reviewed documented beta blocker date and time   Airway Mallampati: III  TM Distance: >3 FB Neck ROM: Full    Dental no notable dental hx. (+) Missing, Dental Advisory Given,    Pulmonary asthma , COPD,  COPD inhaler, former smoker,    Pulmonary exam normal breath sounds clear to auscultation       Cardiovascular negative cardio ROS Normal cardiovascular exam Rhythm:Regular Rate:Normal  TTE 05/2019 Normal LVEF, no significant valvular abnormalities   Neuro/Psych PSYCHIATRIC DISORDERS Anxiety Depression negative neurological ROS     GI/Hepatic GERD  Medicated and Controlled,  Endo/Other  negative endocrine ROSObese BMI 38  Renal/GU negative Renal ROS  negative genitourinary   Musculoskeletal  (+) Arthritis ,   Abdominal   Peds  Hematology negative hematology ROS (+)   Anesthesia Other Findings   Reproductive/Obstetrics                            Anesthesia Physical Anesthesia Plan  ASA: III  Anesthesia Plan: General   Post-op Pain Management:    Induction: Intravenous  PONV Risk Score and Plan: 3 and Midazolam, Dexamethasone and Ondansetron  Airway Management Planned: Oral ETT  Additional Equipment:   Intra-op Plan:   Post-operative Plan: Extubation in OR  Informed Consent: I have reviewed the patients History and Physical, chart, labs and discussed the procedure including the risks, benefits and alternatives for the proposed anesthesia with the patient or authorized representative who has indicated his/her understanding and acceptance.     Dental advisory given  Plan Discussed with: CRNA  Anesthesia Plan Comments:         Anesthesia Quick Evaluation

## 2019-09-28 NOTE — Discharge Instructions (Signed)
INSTRUCTIONS FOR AFTER SURGERY   You will likely have some questions about what to expect following your operation.  The following information will help you and your family understand what to expect when you are discharged from the hospital.  Following these guidelines will help ensure a smooth recovery and reduce risks of complications.  Postoperative instructions include information on: diet, wound care, medications and physical activity.  AFTER SURGERY Expect to go home after the procedure.  In some cases, you may need to spend one night in the hospital for observation.  DIET This surgery does not require a specific diet.  However, I have to mention that the healthier you eat the better your body can start healing. It is important to increasing your protein intake.  This means limiting the foods with added sugar.  Focus on fruits and vegetables and some meat.  If you have any liposuction during your procedure be sure to drink water.  If your urine is bright yellow, then it is concentrated, and you need to drink more water.  As a general rule after surgery, you should have 8 ounces of water every hour while awake.  If you find you are persistently nauseated or unable to take in liquids let us know.  NO TOBACCO USE or EXPOSURE.  This will slow your healing process and increase the risk of a wound.  WOUND CARE If you don't have a drain: You can shower the day after surgery.  Use fragrance free soap.  Dial, Dove, Ivory and Cetaphil are usually mild on the skin.  If you have steri-strips / tape directly attached to your skin leave them in place. It is OK to get these wet.  No baths, pools or hot tubs for two weeks. We close your incision to leave the smallest and best-looking scar. No ointment or creams on your incisions until given the go ahead.  Especially not Neosporin (Too many skin reactions with this one).  A few weeks after surgery you can use Mederma and start massaging the scar. We ask you to  wear your binder or sports bra for the first 6 weeks around the clock, including while sleeping. This provides added comfort and helps reduce the fluid accumulation at the surgery site.  ACTIVITY No heavy lifting until cleared by the doctor.  It is OK to walk and climb stairs. In fact, moving your legs is very important to decrease your risk of a blood clot.  It will also help keep you from getting deconditioned.  Every 1 to 2 hours get up and walk for 5 minutes. This will help with a quicker recovery back to normal.  Let pain be your guide so you don't do too much.  NO, you cannot do the spring cleaning and don't plan on taking care of anyone else.  This is your time for TLC.   WORK Everyone returns to work at different times. As a rough guide, most people take at least 1 - 2 weeks off prior to returning to work. If you need documentation for your job, bring the forms to your postoperative follow up visit.  DRIVING Arrange for someone to bring you home from the hospital.  You may be able to drive a few days after surgery but not while taking any narcotics or valium.  BOWEL MOVEMENTS Constipation can occur after anesthesia and while taking pain medication.  It is important to stay ahead for your comfort.  We recommend taking Milk of Magnesia (2 tablespoons; twice   a day) while taking the pain pills.  SEROMA This is fluid your body tried to put in the surgical site.  This is normal but if it creates excessive pain and swelling let us know.  It usually decreases in a few weeks.  MEDICATIONS and PAIN CONTROL At your preoperative visit for you history and physical you were given the following medications: 1. An antibiotic: Start this medication when you get home and take according to the instructions on the bottle. 2. Zofran 4 mg:  This is to treat nausea and vomiting.  You can take this every 6 hours as needed and only if needed. 3. Norco (hydrocodone/acetaminophen) 5/325 mg:  This is only to be  used after you have taken the motrin or the tylenol. Every 8 hours as needed. Over the counter Medication to take: 4. Ibuprofen (Motrin) 600 mg:  Take this every 6 hours.  If you have additional pain then take 500 mg of the tylenol.  Only take the Norco after you have tried these two. 5. Miralax or stool softener of choice: Take this according to the bottle if you take the [place] Call your surgeon's office if any of the following occur: . Fever 101 degrees F or greater . Excessive bleeding or fluid from the incision site. . Pain that increases over time without aid from the medications . Redness, warmth, or pus draining from incision sites . Persistent nausea or inability to take in liquids . Severe misshapen area that underwent the operation.   Post Anesthesia Home Care Instructions  Activity: Get plenty of rest for the remainder of the day. A responsible individual must stay with you for 24 hours following the procedure.  For the next 24 hours, DO NOT: -Drive a car -Paediatric nurse -Drink alcoholic beverages -Take any medication unless instructed by your physician -Make any legal decisions or sign important papers.  Meals: Start with liquid foods such as gelatin or soup. Progress to regular foods as tolerated. Avoid greasy, spicy, heavy foods. If nausea and/or vomiting occur, drink only clear liquids until the nausea and/or vomiting subsides. Call your physician if vomiting continues.  Special Instructions/Symptoms: Your throat may feel dry or sore from the anesthesia or the breathing tube placed in your throat during surgery. If this causes discomfort, gargle with warm salt water. The discomfort should disappear within 24 hours.  If you had a scopolamine patch placed behind your ear for the management of post- operative nausea and/or vomiting:  1. The medication in the patch is effective for 72 hours, after which it should be removed.  Wrap patch in a tissue and  discard in the trash. Wash hands thoroughly with soap and water. 2. You may remove the patch earlier than 72 hours if you experience unpleasant side effects which may include dry mouth, dizziness or visual disturbances. 3. Avoid touching the patch. Wash your hands with soap and water after contact with the patch.    Next dose of Tylenol can be taken at 230pm today if needed.

## 2019-09-29 ENCOUNTER — Encounter: Payer: Self-pay | Admitting: *Deleted

## 2019-09-29 LAB — SURGICAL PATHOLOGY

## 2019-10-07 ENCOUNTER — Encounter: Payer: Self-pay | Admitting: Plastic Surgery

## 2019-10-07 ENCOUNTER — Other Ambulatory Visit: Payer: Self-pay

## 2019-10-07 ENCOUNTER — Ambulatory Visit (INDEPENDENT_AMBULATORY_CARE_PROVIDER_SITE_OTHER): Payer: Medicaid Other | Admitting: Plastic Surgery

## 2019-10-07 VITALS — BP 106/73 | HR 77 | Temp 98.2°F | Ht 65.0 in | Wt 234.8 lb

## 2019-10-07 DIAGNOSIS — N6489 Other specified disorders of breast: Secondary | ICD-10-CM

## 2019-10-07 NOTE — Progress Notes (Signed)
The patient is a 59 year old female here for follow-up on her left breast mastopexy reduction.  She has a little bit of a seroma.  I was able to aspirate 50 cc of serosanguineous fluid since her healing nicely.  There is no sign of infection.  Her bruising is very minimal.  Patient states her pain is well controlled.  She has an appointment to see [place] back in a few weeks.  I definitely want to see her back.  Call with any questions or concerns.  I would like to see her sooner if it fills up with fluid.

## 2019-10-17 ENCOUNTER — Ambulatory Visit: Payer: Medicaid Other | Admitting: Adult Health

## 2019-10-17 ENCOUNTER — Other Ambulatory Visit: Payer: Medicaid Other

## 2019-10-17 NOTE — Progress Notes (Signed)
Patient is a 59 year old female here for follow-up after her left breast mastopexy reduction on 09/28/19 with Dr. Marla Roe.  She had 50 cc of serosanguineous fluid aspirated from her left breast at her last visit on 10/07/2019.  Today Ms. Naseem is tearful. Reports her left breast "feels like the right side did (when she had cancer)" and that she "isn't in a good place". She had her last mammogram on 04/13/2019 which showed high density but otherwise negative. In addition to dealing with her own breast cancer diagnosis, she is worried about her mother who lives next door and is currently in the hospital, and "other things".  Physically she is healing well. Left breast incisions are healing very well, c/d/i. Steristrips removed. Breast is soft without any significant firmness. Some swelling/fluid present, but none able to be aspirated. She expressed concern about the amount of lift achieved on her left breast and so spoke today with Dr. Marla Roe.   We discussed therapy options to help her get back to a "good place". Reports she has a current therapist but does not like them and so doesn't want to go back to see them. Provided her with contact information for 2 counselors that were provided by [facility]. She states she will 'think about it'.   Follow up in 2 weeks with Dr. Marla Roe. Call office with any questions/concerns.   Pictures were obtained of the patient and placed in the chart with the patient's or guardian's permission.  The [place] was signed into law in 2016 which includes the topic of electronic health records.  This provides immediate access to information in MyChart.  This includes consultation notes, operative notes, office notes, lab results and pathology reports.  If you have any questions about what you read please let us know at your next visit or call us at the office.  We are right here with you.

## 2019-10-18 ENCOUNTER — Ambulatory Visit (INDEPENDENT_AMBULATORY_CARE_PROVIDER_SITE_OTHER): Payer: Medicaid Other | Admitting: Plastic Surgery

## 2019-10-18 ENCOUNTER — Encounter: Payer: Self-pay | Admitting: Plastic Surgery

## 2019-10-18 ENCOUNTER — Other Ambulatory Visit: Payer: Self-pay

## 2019-10-18 VITALS — BP 113/70 | HR 91 | Temp 97.1°F | Ht 65.0 in | Wt 235.2 lb

## 2019-10-18 DIAGNOSIS — Z9011 Acquired absence of right breast and nipple: Secondary | ICD-10-CM

## 2019-10-18 DIAGNOSIS — N6489 Other specified disorders of breast: Secondary | ICD-10-CM

## 2019-10-18 MED ORDER — HYDROCODONE-ACETAMINOPHEN 5-325 MG PO TABS: 1.0000 | ORAL_TABLET | Freq: Three times a day (TID) | ORAL | 0 refills | Status: AC | PRN

## 2019-10-18 MED FILL — ALBUTEROL SULFATE HFA 108 (: 108 (90 BAS | 16 days supply | Qty: 18 | Fill #2

## 2019-10-18 NOTE — Addendum Note (Signed)
Addended by: Wallace Going on: 10/18/2019 07:55 PM   Modules accepted: Orders

## 2019-10-19 MED FILL — HYDROCODON-APAP 5-325: 5-325 | 5 days supply | Qty: 15 | Fill #0

## 2019-10-21 ENCOUNTER — Other Ambulatory Visit: Payer: Self-pay

## 2019-10-21 ENCOUNTER — Encounter: Payer: Medicaid Other | Admitting: Plastic Surgery

## 2019-10-21 ENCOUNTER — Encounter: Payer: Self-pay | Admitting: Adult Health

## 2019-10-21 ENCOUNTER — Inpatient Hospital Stay: Payer: Medicaid Other | Attending: Oncology

## 2019-10-21 ENCOUNTER — Inpatient Hospital Stay (HOSPITAL_BASED_OUTPATIENT_CLINIC_OR_DEPARTMENT_OTHER): Payer: Medicaid Other | Admitting: Adult Health

## 2019-10-21 VITALS — BP 130/60 | HR 73 | Temp 98.3°F | Resp 18 | Ht 65.0 in | Wt 231.8 lb

## 2019-10-21 DIAGNOSIS — C50411 Malignant neoplasm of upper-outer quadrant of right female breast: Secondary | ICD-10-CM

## 2019-10-21 DIAGNOSIS — Z87891 Personal history of nicotine dependence: Secondary | ICD-10-CM | POA: Insufficient documentation

## 2019-10-21 DIAGNOSIS — Z171 Estrogen receptor negative status [ER-]: Secondary | ICD-10-CM | POA: Diagnosis not present

## 2019-10-21 DIAGNOSIS — Z923 Personal history of irradiation: Secondary | ICD-10-CM | POA: Insufficient documentation

## 2019-10-21 DIAGNOSIS — M549 Dorsalgia, unspecified: Secondary | ICD-10-CM | POA: Insufficient documentation

## 2019-10-21 DIAGNOSIS — F329 Major depressive disorder, single episode, unspecified: Secondary | ICD-10-CM | POA: Diagnosis not present

## 2019-10-21 DIAGNOSIS — J45901 Unspecified asthma with (acute) exacerbation: Secondary | ICD-10-CM

## 2019-10-21 DIAGNOSIS — Z853 Personal history of malignant neoplasm of breast: Secondary | ICD-10-CM | POA: Diagnosis present

## 2019-10-21 DIAGNOSIS — Z9011 Acquired absence of right breast and nipple: Secondary | ICD-10-CM | POA: Diagnosis not present

## 2019-10-21 DIAGNOSIS — J441 Chronic obstructive pulmonary disease with (acute) exacerbation: Secondary | ICD-10-CM

## 2019-10-21 DIAGNOSIS — F410 Panic disorder [episodic paroxysmal anxiety] without agoraphobia: Secondary | ICD-10-CM | POA: Diagnosis not present

## 2019-10-21 LAB — CBC WITH DIFFERENTIAL/PLATELET
Abs Immature Granulocytes: 0.01 10*3/uL (ref 0.00–0.07)
Basophils Absolute: 0 10*3/uL (ref 0.0–0.1)
Basophils Relative: 1 %
Eosinophils Absolute: 0.1 10*3/uL (ref 0.0–0.5)
Eosinophils Relative: 2 %
HCT: 39.2 % (ref 36.0–46.0)
Hemoglobin: 12.4 g/dL (ref 12.0–15.0)
Immature Granulocytes: 0 %
Lymphocytes Relative: 18 %
Lymphs Abs: 1 10*3/uL (ref 0.7–4.0)
MCH: 29 pg (ref 26.0–34.0)
MCHC: 31.6 g/dL (ref 30.0–36.0)
MCV: 91.6 fL (ref 80.0–100.0)
Monocytes Absolute: 0.4 10*3/uL (ref 0.1–1.0)
Monocytes Relative: 6 %
Neutro Abs: 4.2 10*3/uL (ref 1.7–7.7)
Neutrophils Relative %: 73 %
Platelets: 203 10*3/uL (ref 150–400)
RBC: 4.28 MIL/uL (ref 3.87–5.11)
RDW: 15.5 % (ref 11.5–15.5)
WBC: 5.8 10*3/uL (ref 4.0–10.5)
nRBC: 0 % (ref 0.0–0.2)

## 2019-10-21 LAB — COMPREHENSIVE METABOLIC PANEL
ALT: 19 U/L (ref 0–44)
AST: 16 U/L (ref 15–41)
Albumin: 3.7 g/dL (ref 3.5–5.0)
Alkaline Phosphatase: 112 U/L (ref 38–126)
Anion gap: 10 (ref 5–15)
BUN: 23 mg/dL — ABNORMAL HIGH (ref 6–20)
CO2: 25 mmol/L (ref 22–32)
Calcium: 9.1 mg/dL (ref 8.9–10.3)
Chloride: 111 mmol/L (ref 98–111)
Creatinine, Ser: 0.84 mg/dL (ref 0.44–1.00)
GFR calc Af Amer: >60 mL/min (ref >60)
GFR calc non Af Amer: >60 mL/min (ref >60)
Glucose, Bld: 91 mg/dL (ref 70–99)
Potassium: 4.1 mmol/L (ref 3.5–5.1)
Sodium: 146 mmol/L — ABNORMAL HIGH (ref 135–145)
Total Bilirubin: 0.4 mg/dL (ref 0.3–1.2)
Total Protein: 7.1 g/dL (ref 6.5–8.1)

## 2019-10-21 NOTE — Progress Notes (Signed)
[place]  [phone] [phone]    ID: Jacqueline Good DOB: 1960/08/19  MR#: [id number]  XFG#:[id number]  Patient Care Team: Vonna Drafts, FNP as PCP - General (Nurse Practitioner) Alphonsa Overall, MD as Consulting Physician (General Surgery) [name], [name], MD as Consulting Physician (Oncology) Eppie Gibson, MD as Attending Physician (Radiation Oncology) Dillingham, Loel Lofty, DO as Attending Physician (Plastic Surgery) Norman Clay, MD as Consulting Physician (Psychiatry) Savas, Allison Quarry, MD as Consulting Physician (Dermatology) OTHER MD: Donnal Moat, PA-C [facility] 336 [phone]   CHIEF COMPLAINT: Estrogen receptor 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 She completed trastuzumab treatments on 03/31/2019.  Her repeat echocardiogram on 06/06/2019 showed an ejection fraction of 55-60%.  She continues on observation alone.   REVIEW OF SYSTEMS: Jacqueline Good has an increased amount of anxiety.  She is takin Xanax PRN, Effexor, Mirtazapine, and Abilify for her Anxiety and depression.  She is struggling with fatigue, anxiety and fearfulness--though she cannot pinpoint exactly what she is fearful of.  She does not want to do therapy, because she doesn't want to address any issues from the past, she prefers to focus on moving forward.  Jacqueline Good also notes an increased appetite, and would like suggestions to decrease it.    Jacqueline Good has some back pain and xrays have shown degeneration in her spine.  Her legs have been aching since she was receiving chemotherapy.  She underwent xrays of her hips and pelvis and those were negative.  Jacqueline Good denies any fever, chills, chest pain, palpitations, cough, shortness of breath, bowel/bladder changes, nausea, vomiting, headaches, vision issues, or any other concerns.  A  detailed ROS was otherwise non contributory.    HISTORY OF CURRENT ILLNESS: From the original intake note:  Jacqueline Good had routine screening mammography on 02/05/2018 showing a possible abnormality in the right breast. She underwent unilateral right diagnostic mammography with tomography and right breast ultrasonography at [facility] on 02/15/2018 showing: Highly suspicious right breast mass at 10 o'clock position.  There was a second, 0.6 cm lesion at the 10:00 radiant which has not been biopsied.  Suspicious focal cortical thickening of a single right axillary lymph node.  Accordingly on 02/25/2018 she proceeded to biopsy of the right breast area in question. The pathology from this procedure showed ([id number]): Breast, right, needle core biopsy, OUQ with microscopic focus of invasive ductal carcinoma, grade 2, arising in a background of high grade ductal carcinoma in situ. 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   The patient's subsequent history is as detailed below.   PAST MEDICAL HISTORY: Past Medical History:  Diagnosis Date  . Anemia   . Anxiety   . Arthritis    "all over" (08/12/2018)  . Asthma   . Breast cancer, right breast ([place]) dx'd 02/2018  . Chronic bronchitis ([place])   . Depression   . Family history of breast cancer   . Family history of prostate cancer   . Family history of uterine cancer   . GERD (gastroesophageal reflux disease)   . History of blood transfusion    "several; related to low blood" (08/12/2018)  . History of radiation therapy 11/15/18- 12/17/18   Right Chest wall, SCV, PAB. 25 fractions.     PAST SURGICAL HISTORY: Past Surgical History:  Procedure Laterality Date  . AXILLARY LYMPH  NODE DISSECTION Right 08/12/2018   Procedure: AXILLARY LYMPH NODE DISSECTION;  Surgeon: Alphonsa Overall, MD;  Location: [place];  Service: General;  Laterality: Right;  . BREAST BIOPSY Right 03/2018  . BREAST  RECONSTRUCTION WITH PLACEMENT OF TISSUE EXPANDER AND FLEX HD (ACELLULAR HYDRATED DERMIS) Right 08/12/2018   Procedure: RIGHT BREAST RECONSTRUCTION WITH PLACEMENT OF TISSUE EXPANDER AND FLEX HD (ACELLULAR HYDRATED DERMIS);  Surgeon: Wallace Going, DO;  Location: [place];  Service: Plastics;  Laterality: Right;  . BREAST REDUCTION SURGERY Left 09/28/2019   Procedure: LEFT MAMMARY REDUCTION/MASTOPEXY  (BREAST);  Surgeon: Wallace Going, DO;  Location: [place];  Service: Plastics;  Laterality: Left;  . DILATION AND CURETTAGE OF UTERUS    . ENDOMETRIAL ABLATION    . IR IMAGING GUIDED PORT INSERTION  03/18/2018  . MASTECTOMY MODIFIED RADICAL Right 08/12/2018   w/axillary LND  . MASTECTOMY MODIFIED RADICAL Right 08/12/2018   Procedure: RIGHT MODIFIED RADICAL MASTECTOMY;  Surgeon: Alphonsa Overall, MD;  Location: [place];  Service: General;  Laterality: Right;  . MYOMECTOMY    . REMOVAL OF TISSUE EXPANDER AND PLACEMENT OF IMPLANT Right 10/26/2018   Procedure: Removal of right breast expander;  Surgeon: Wallace Going, DO;  Location: [place];  Service: Plastics;  Laterality: Right;  75 min, please    FAMILY HISTORY: Family History  Problem Relation Age of Onset  . Lupus Sister   . Heart disease Sister   . Breast cancer Mother 58  . Prostate cancer Paternal Uncle   . Diabetes Maternal Grandmother   . Heart Problems Maternal Grandmother   . Prostate cancer Maternal Grandfather   . Prostate cancer Paternal Grandfather   . Prostate cancer Other        MGFs brother  . Breast cancer Other        Mat great-grandmother's sister  . Uterine cancer Maternal Great-grandmother   . Prostate cancer Other        PGFs brother   She notes that her father is currently 60 years old Patients' mother is currently 60 years old and she was diagnosed with breast cancer at age 60. The patient has 4 brothers and 1 sister. She has a maternal aunt with breast cancer and her maternal great  grandmother with had ovarian cancer.    GYNECOLOGIC HISTORY:  No LMP recorded. Patient is postmenopausal. Menarche: 59 years old Age at first live birth: 59 years old GX P: 1 LMP: 13-14 years ago s/p ablation Contraceptive: no HRT: no  Hysterectomy: no SO: no   SOCIAL HISTORY: She is currently unemployed.  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 She lives alone without pets. Her daughter is Jacqueline Good who lives in [place] and works in Therapist, art.  The patient has one grandchild. She goes to a [employer].   ADVANCED DIRECTIVES: Not in place.  At the 03/03/2018 visit the patient was given the appropriate documents to complete and notarized at her discretion   HEALTH MAINTENANCE: Social History   Tobacco Use  . Smoking status: Former Smoker    Packs/day: 1.00    Years: 38.00    Pack years: 38.00    Types: Cigarettes    Quit date: 05/15/2018    Years since quitting: 1.4  . Smokeless tobacco: Never Used  Substance Use Topics  . Alcohol use: Yes    Comment: occ  . Drug use: No     Colonoscopy: Never  PAP:   Bone density: Never  Allergies  Allergen Reactions  . Gadolinium Derivatives Itching and Cough    Pt began sneezing and coughing as soon as MRI contrast was injected. Severe nasal congestion. No rash or hives no SOB.   Marland Kitchen Hydrocodone Hives and Itching    Pt says she can take it with benadryl.   . Iodinated Diagnostic Agents Itching    Current Outpatient Medications  Medication Sig Dispense Refill  . ALPRAZolam (XANAX) 1 MG tablet 1 mg three times and 0.5 mg per day as needed for anxiety. No more than 3.5 mg per day 105 tablet 1  . ARIPiprazole (ABILIFY) 2 MG tablet Take 1 tablet (2 mg total) by mouth daily. 90 tablet 1  . esomeprazole (NEXIUM) 40 MG capsule TAKE 1 CAPSULE BY MOUTH ONCE DAILY 30 capsule 2  . Fluticasone-Salmeterol (ADVAIR DISKUS) 250-50 MCG/DOSE AEPB INHALE 1 PUFF INTO THE LUNGS 2 TIMES DAILY. 60 each 2  .  HYDROcodone-acetaminophen (NORCO) 5-325 MG tablet Take 1 tablet by mouth every 8 (eight) hours as needed for up to 5 days for severe pain. 15 tablet 0  . loratadine (CLARITIN) 10 MG tablet Take 1 tablet (10 mg total) by mouth daily as needed for allergies. 30 tablet 11  . mirtazapine (REMERON) 15 MG tablet Take 1 tablet (15 mg total) by mouth at bedtime. 90 tablet 1  . naproxen (NAPROSYN) 500 MG tablet Take 1 tablet (500 mg total) by mouth 2 (two) times daily with a meal. 60 tablet 2  . omeprazole (PRILOSEC) 40 MG capsule TAKE 1 CAPSULE BY MOUTH DAILY. 30 capsule 2  . propranolol (INDERAL) 40 MG tablet Take 40 mg by mouth daily as needed (heart palpitation.).     Marland Kitchen tiZANidine (ZANAFLEX) 4 MG tablet     . venlafaxine (EFFEXOR) 75 MG tablet Take 3 tablets (225 mg total) by mouth daily. 270 tablet 0  . zolpidem (AMBIEN) 10 MG tablet Take 1 tablet (10 mg total) by mouth at bedtime as needed for sleep. 30 tablet 2  . fluticasone (FLONASE) 50 MCG/ACT nasal spray Place 2 sprays into both nostrils daily as needed for up to 14 days for allergies. 1 g 1   No current facility-administered medications for this visit.    OBJECTIVE:  Vitals:   10/21/19 1251  BP: 130/60  Pulse: 73  Resp: 18  Temp: 98.3 F (36.8 C)  SpO2: 98%     Body mass index is 38.57 kg/m.   Wt Readings from Last 3 Encounters:  10/21/19 231 lb 12.8 oz (105.1 kg)  10/18/19 235 lb 3.2 oz (106.7 kg)  10/07/19 234 lb 12.8 oz (106.5 kg)  ECOG FS:1 - Symptomatic but completely ambulatory GENERAL: Patient is a well appearing female in no acute distress HEENT:  Sclerae anicteric.  Oropharynx clear and moist. No ulcerations or evidence of oropharyngeal candidiasis. Neck is supple.  NODES:  No cervical, supraclavicular, or axillary lymphadenopathy palpated.  BREAST EXAM:  Right breast s/p mastectomy and radiation, no sign of local recurrence, left breast s/p surgery, healing well, benign LUNGS:  Clear to auscultation bilaterally.  No  wheezes or rhonchi. HEART:  Regular rate and rhythm. No murmur appreciated. ABDOMEN:  Soft, nontender.  Positive, normoactive bowel sounds. No organomegaly palpated. MSK:  No focal spinal tenderness to palpation. Full range of motion bilaterally in the upper extremities. EXTREMITIES:  No peripheral edema.   SKIN:  Clear with no obvious rashes or skin changes. No nail dyscrasia. NEURO:  Nonfocal. Well oriented.  Appropriate affect.  LAB RESULTS:  CMP     Component Value Date/Time   NA 142 07/18/2019 1034   K 3.9 07/18/2019 1034   CL 107 07/18/2019 1034   CO2 25 07/18/2019 1034   GLUCOSE 99 07/18/2019 1034   BUN 23 (H) 07/18/2019 1034   CREATININE 1.04 (H) 07/18/2019 1034   CREATININE 0.86 01/05/2019 0832   CREATININE 0.83 09/24/2016 1151   CALCIUM 9.2 07/18/2019 1034   PROT 7.2 07/18/2019 1034   ALBUMIN 3.8 07/18/2019 1034   AST 20 07/18/2019 1034   AST 14 (L) 01/05/2019 0832   ALT 27 07/18/2019 1034   ALT 19 01/05/2019 0832   ALKPHOS 86 07/18/2019 1034   BILITOT 0.4 07/18/2019 1034   BILITOT 0.3 01/05/2019 0832   GFRNONAA 59 (L) 07/18/2019 1034   GFRNONAA >60 01/05/2019 0832   GFRNONAA 79 09/24/2016 1151   GFRAA >60 07/18/2019 1034   GFRAA >60 01/05/2019 0832   GFRAA >89 09/24/2016 1151    No results found for: TOTALPROTELP, ALBUMINELP, A1GS, A2GS, BETS, BETA2SER, GAMS, MSPIKE, SPEI  No results found for: KPAFRELGTCHN, LAMBDASER, KAPLAMBRATIO  Lab Results  Component Value Date   WBC 5.8 10/21/2019   NEUTROABS 4.2 10/21/2019   HGB 12.4 10/21/2019   HCT 39.2 10/21/2019   MCV 91.6 10/21/2019   PLT 203 10/21/2019    @LASTCHEMISTRY @  No results found for: LABCA2  No components found for: ULAGTX646  No results for input(s): INR in the last 168 hours.  No results found for: LABCA2  No results found for: OEH212  No results found for: YQM250  No results found for: IBB048  No results found for: CA2729  No components found for: HGQUANT  No results  found for: CEA1 / No results found for: CEA1   No results found for: AFPTUMOR  No results found for: CHROMOGRNA  No results found for: PSA1  Appointment on 10/21/2019  Component Date Value Ref Range Status  . WBC 10/21/2019 5.8  4.0 - 10.5 K/uL Final  . RBC 10/21/2019 4.28  3.87 - 5.11 MIL/uL Final  . Hemoglobin 10/21/2019 12.4  12.0 - 15.0 g/dL Final  . HCT 10/21/2019 39.2  36.0 - 46.0 % Final  . MCV 10/21/2019 91.6  80.0 - 100.0 fL Final  . MCH 10/21/2019 29.0  26.0 - 34.0 pg Final  . MCHC 10/21/2019 31.6  30.0 - 36.0 g/dL Final  . RDW 10/21/2019 15.5  11.5 - 15.5 % Final  . Platelets 10/21/2019 203  150 - 400 K/uL Final  . nRBC 10/21/2019 0.0  0.0 - 0.2 % Final  . Neutrophils Relative % 10/21/2019 73  % Final  . Neutro Abs 10/21/2019 4.2  1.7 - 7.7 K/uL Final  . Lymphocytes Relative 10/21/2019 18  % Final  . Lymphs Abs 10/21/2019 1.0  0.7 - 4.0 K/uL Final  . Monocytes Relative 10/21/2019 6  % Final  . Monocytes Absolute 10/21/2019 0.4  0.1 - 1.0 K/uL Final  . Eosinophils Relative 10/21/2019 2  % Final  . Eosinophils Absolute 10/21/2019 0.1  0.0 - 0.5 K/uL Final  . Basophils Relative 10/21/2019 1  % Final  . Basophils Absolute 10/21/2019 0.0  0.0 - 0.1 K/uL Final  . Immature Granulocytes 10/21/2019 0  % Final  . Abs Immature Granulocytes 10/21/2019 0.01  0.00 - 0.07 K/uL Final   Performed at [facility], [place] [address], [place], [place] [address]    (this displays the last labs from the last 3 days)  No results found for: TOTALPROTELP, ALBUMINELP, A1GS, A2GS, BETS, BETA2SER, GAMS, MSPIKE, SPEI (this displays SPEP labs)  No results found for: KPAFRELGTCHN, LAMBDASER, KAPLAMBRATIO (kappa/lambda light chains)  No results found for: HGBA, HGBA2QUANT, HGBFQUANT, HGBSQUAN (Hemoglobinopathy evaluation)   No results found for: LDH  No results found for: IRON, TIBC, IRONPCTSAT (Iron and TIBC)  No results found for: FERRITIN  Urinalysis     Component Value Date/Time   COLORURINE CANCELED 09/24/2016 1151   APPEARANCEUR CANCELED 09/24/2016 1151   LABSPEC CANCELED 09/24/2016 1151   PHURINE CANCELED 09/24/2016 1151   GLUCOSEU CANCELED 09/24/2016 1151   HGBUR CANCELED 09/24/2016 1151   BILIRUBINUR CANCELED 09/24/2016 1151   KETONESUR CANCELED 09/24/2016 1151   PROTEINUR CANCELED 09/24/2016 1151   NITRITE CANCELED 09/24/2016 1151   LEUKOCYTESUR CANCELED 09/24/2016 1151     STUDIES:  No results found.   ELIGIBLE FOR AVAILABLE RESEARCH PROTOCOL:BCEP   ASSESSMENT: 59 y.o. [place] woman status post right breast upper outer quadrant biopsy 02/25/2018 for a clinical T2 pN1, stage IIB invasive ductal carcinoma, estrogen and progesterone receptor negative, HER-2 amplified, with an MIB-1 of 50%.  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 STK11, TP53, TSC1, TSC2, and VHL.  The following genes were evaluated for sequence changes only: SDHA and HOXB13 c.251G>A variant only.   (2) neoadjuvant chemotherapy consisting of carboplatin, docetaxel, trastuzumab and pertuzumab every 21 days x 6 starting 03/23/2018, completed 07/12/2018  (a) pertuzumab omitted after cycle 1 due to poorly-controlled diarrhea  (b) docetaxel discontinued after cycle 3 due to neuropathy; gemcitabine substituted  (c) Granix substituted for Neulasta on days 3-6 following chemotherapy due to improved tolerance  (d) Switched back  to Neulasta after cycles 5 and 6 per patient request  (3) anti-HER-2 treatment continued for 12 months, last dose 03/31/2019  (a) baseline echocardiogram 03/16/2018 shows an ejection fraction in the 55-60% range.  (b) echo on 11/15 shows EF of 55-60%  (c) echo 02/18/2019 shows EF 55-60%  (4) status post right modified radical mastectomy 08/12/2018 showing a complete pathologic response (ypT0 ypN0)  (a) a total of 11 regional lymph nodes removed  (b) expander in place  (5) adjuvant radiation 11/15/2018-12/17/2018: Right chest wall 50 Gy in 25 fractions; Right CW, SCV,PAB: 50 Gy in 25 fractions  (6) left adrenal adenoma measuring 2.9 cm on CT of the chest 03/16/2018   PLAN: Jacqueline Good has no signs of cancer recurrence.  She will be due for repeat mammogram of her left breast on 04/2020.  We reviewed healthy diet and exercise today.  Jacqueline Good is very anxious.  She was tearful during our appointment and notes that she is having increased panic attacks.  I recommended that she f/u with her psychiatrist, as they can tweak her medications to help with her increased appetite, increased anxiety.  She verbalizes understanding of this.  Jacqueline Good and I reviewed her leg and back pain.  I discussed with her Dr. [name] impression, and that her leg aching was not cancer or treatment related, particularly since her xrays were negative.    Jacqueline Good will  return in 3 months for labs and f/u.  She was recommended to continue with the appropriate pandemic precautions. She knows to call for any questions that may arise between now and her next appointment.  We are happy to see her sooner if needed.   Total encounter time: 30 minutes*  Wilber Bihari, NP 10/21/19 1:08 PM Medical Oncology and Hematology [facility] [place], [place] [address] Tel. [phone]    Fax. [phone]  *Total Encounter Time as defined by the Centers for Medicare and Medicaid Services includes, in addition  to the face-to-face time of a patient visit (documented in the note above) non-face-to-face time: obtaining and reviewing outside history, ordering and reviewing medications, tests or procedures, care coordination (communications with other health care professionals or caregivers) and documentation in the medical record.

## 2019-10-24 ENCOUNTER — Telehealth: Payer: Self-pay | Admitting: Adult Health

## 2019-10-24 NOTE — Telephone Encounter (Signed)
Per 3/12 los, appts were already scheduled.

## 2019-10-26 MED FILL — ZOLPIDEM TARTRATE 10 MG TAB: 10 | 15 days supply | Qty: 15 | Fill #2

## 2019-10-26 MED FILL — ALPRAZolam 1 MG TABS: 1 | 30 days supply | Qty: 105 | Fill #1

## 2019-10-27 MED FILL — ZOLPIDEM TARTRATE 10 MG TAB: 10 | 15 days supply | Qty: 15 | Fill #3

## 2019-11-01 ENCOUNTER — Other Ambulatory Visit: Payer: Self-pay

## 2019-11-01 ENCOUNTER — Ambulatory Visit (INDEPENDENT_AMBULATORY_CARE_PROVIDER_SITE_OTHER): Payer: Medicaid Other | Admitting: Plastic Surgery

## 2019-11-01 ENCOUNTER — Encounter: Payer: Self-pay | Admitting: Plastic Surgery

## 2019-11-01 VITALS — BP 127/86 | HR 76 | Temp 97.3°F | Ht 65.0 in | Wt 232.0 lb

## 2019-11-01 DIAGNOSIS — C50411 Malignant neoplasm of upper-outer quadrant of right female breast: Secondary | ICD-10-CM

## 2019-11-01 DIAGNOSIS — Z171 Estrogen receptor negative status [ER-]: Secondary | ICD-10-CM

## 2019-11-01 DIAGNOSIS — N6489 Other specified disorders of breast: Secondary | ICD-10-CM

## 2019-11-01 NOTE — Progress Notes (Signed)
   Subjective:    Patient ID: Jacqueline Good, female    DOB: 10-30-1960, 59 y.o.   MRN: [id number]  The patient is a 59 year old female here for follow-up on her breast surgery.  She had breast cancer on the right breast and underwent for stage reconstruction.  She had some complications and in the end decided to have the expander removed.  She then decided on a left breast mastopexy reduction for improved symmetry and her close with a prosthetic.  She asked to be still full so we were careful not to remove too much tissue.  Her incisions are all healing extremely well.  There is no sign of infection.  She may have a little bit of a seroma but not enough to try and drain.  She is asking today about reconstruction on the right.  She does not feel she has full range of motion but feels that the physical therapy did help some.     Review of Systems  Constitutional: Negative.   HENT: Negative.   Eyes: Negative.   Respiratory: Negative.   Cardiovascular: Negative.   Gastrointestinal: Negative.   Genitourinary: Negative.   Musculoskeletal: Negative.   Hematological: Negative.        Objective:   Physical Exam Constitutional:      Appearance: Normal appearance.  Cardiovascular:     Rate and Rhythm: Normal rate.     Pulses: Normal pulses.  Pulmonary:     Effort: Pulmonary effort is normal.  Neurological:     General: No focal deficit present.     Mental Status: She is alert and oriented to person, place, and time.  Psychiatric:        Mood and Affect: Mood normal.        Behavior: Behavior normal.           Assessment & Plan:     ICD-10-CM   1. Postoperative breast asymmetry  N64.89   2. Malignant neoplasm of upper-outer quadrant of right breast in female, estrogen receptor negative ([place])  C50.411    Z17.1     We discussed the options for reconstruction on the right.  Expander only is likely not a good idea and I am afraid would not work.  I am concerned that she would  have skin breakdown again.  The option for using her abdomen in an autologous reconstruction with either a Diep flap or a TRAM is possible she is also a candidate for a latissimus flap.  She is going to think this over and we will see her back in 3 months.

## 2019-11-12 MED FILL — tiZANidine HCL 4 MG TABS: 4 | 30 days supply | Qty: 60 | Fill #3

## 2019-11-26 MED FILL — NAPROXEN 500 MG TABS: 500 | 30 days supply | Qty: 60 | Fill #1

## 2019-11-26 MED FILL — ALBUTEROL SULFATE HFA 108 (: 108 (90 BAS | 16 days supply | Qty: 18 | Fill #3

## 2019-11-26 MED FILL — ZOLPIDEM TARTRATE 10 MG TAB: 10 | 15 days supply | Qty: 15 | Fill #5

## 2019-11-26 MED FILL — ZOLPIDEM TARTRATE 10 MG TAB: 10 | 15 days supply | Qty: 15 | Fill #4

## 2019-11-28 ENCOUNTER — Telehealth (HOSPITAL_COMMUNITY): Payer: Self-pay | Admitting: *Deleted

## 2019-11-28 ENCOUNTER — Other Ambulatory Visit (HOSPITAL_COMMUNITY): Payer: Self-pay | Admitting: Psychiatry

## 2019-11-28 MED ORDER — ALPRAZOLAM 1 MG PO TABS: ORAL_TABLET | ORAL | 0 refills | Status: DC

## 2019-11-28 MED FILL — ALPRAZolam 1 MG TABS: 1 | 30 days supply | Qty: 105 | Fill #0

## 2019-11-28 NOTE — Telephone Encounter (Signed)
Ordered

## 2019-11-28 NOTE — Telephone Encounter (Signed)
Patient called after [place] Patient Informed 0 refills >> 0 on file /hold.  ALPRAZolam (XANAX) 1 MG tablet  1 mg three times and 0.5 mg per day as needed for anxiety. No more than 3.5 mg per day

## 2019-12-13 ENCOUNTER — Other Ambulatory Visit (HOSPITAL_COMMUNITY): Payer: Self-pay | Admitting: Nurse Practitioner

## 2019-12-14 MED FILL — tiZANidine HCL 4 MG TABS: 4 | 30 days supply | Qty: 60 | Fill #0

## 2019-12-14 NOTE — Progress Notes (Signed)
Virtual Visit via Video Note  I connected with Jacqueline Good on 12/19/19 at  3:00 PM EDT by a video enabled telemedicine application and verified that I am speaking with the correct person using two identifiers.   I discussed the limitations of evaluation and management by telemedicine and the availability of in person appointments. The patient expressed understanding and agreed to proceed.    I discussed the assessment and treatment plan with the patient. The patient was provided an opportunity to ask questions and all were answered. The patient agreed with the plan and demonstrated an understanding of the instructions.   The patient was advised to call back or seek an in-person evaluation if the symptoms worsen or if the condition fails to improve as anticipated.  I provided 20 minutes of non-face-to-face time during this encounter.   Norman Clay, MD    [facility] MD/PA/NP OP Progress Note  12/19/2019 3:29 PM Jacqueline Good  MRN:  [id number]  Chief Complaint:  Chief Complaint    Depression; Follow-up       HPI:  - She underwent left breast mastopexy reduction for improved symmetry   This is a follow-up appointment for depression.  She states that she is not doing well.  She had a couple of deaths in the family.  She lost her nephew and half-sister 5 days ago.  She has been crying about everything, although she thinks that medication is helping some for her mood.  She also states that her mother is sick.  She helps her mother to bring to the appointment and physical therapy. She feels nervous about upcoming surgery at [facility].   Although she used to be more active, walk in the park to keep her mind occupied, she has not been doing it since she had an uncomfortable encounter with a man in the park.  She is unable to do stretch due to back pain.  She is concerned about weight gain.  She does not think her diet has changed.  She gained 20 pounds over the past 5 months. After having discussion  of switching her medication, she reports her preference to stay on her current medication regimen given its potential side effect of weight gain. She is not interested in doing therapy.  She denies insomnia.  She feels fatigue.  She has anhedonia.  She has difficulty in concentration.  She denies SI.  She feels anxious and tense.  She has occasional panic attacks.   Wt Readings from Last 3 Encounters:  11/01/19 232 lb (105.2 kg)  10/21/19 231 lb 12.8 oz (105.1 kg)  10/18/19 235 lb 3.2 oz (106.7 kg)    Visit Diagnosis:    ICD-10-CM   1. MDD (major depressive disorder), recurrent episode, moderate (HCC)  F33.1   2. Insomnia, unspecified type  G47.00     Past Psychiatric History: Please see initial evaluation for full details. I have reviewed the history. No updates at this time.     Past Medical History:  Past Medical History:  Diagnosis Date  . Anemia   . Anxiety   . Arthritis    "all over" (08/12/2018)  . Asthma   . Breast cancer, right breast ([place]) dx'd 02/2018  . Chronic bronchitis ([place])   . Depression   . Family history of breast cancer   . Family history of prostate cancer   . Family history of uterine cancer   . GERD (gastroesophageal reflux disease)   . History of blood transfusion    "  several; related to low blood" (08/12/2018)  . History of radiation therapy 11/15/18- 12/17/18   Right Chest wall, [facility]CV, PAB. 25 fractions.     Past Surgical History:  Procedure Laterality Date  . AXILLARY LYMPH NODE DISSECTION Right 08/12/2018   Procedure: AXILLARY LYMPH NODE DISSECTION;  Surgeon: Alphonsa Overall, MD;  Location: [place];  Service: General;  Laterality: Right;  . BREAST BIOPSY Right 03/2018  . BREAST RECONSTRUCTION WITH PLACEMENT OF TISSUE EXPANDER AND FLEX HD (ACELLULAR HYDRATED DERMIS) Right 08/12/2018   Procedure: RIGHT BREAST RECONSTRUCTION WITH PLACEMENT OF TISSUE EXPANDER AND FLEX HD (ACELLULAR HYDRATED DERMIS);  Surgeon: [place] Going, DO;  Location: [place];  Service:  Plastics;  Laterality: Right;  . BREAST REDUCTION SURGERY Left 09/28/2019   Procedure: LEFT MAMMARY REDUCTION/MASTOPEXY  (BREAST);  Surgeon: [place] Going, DO;  Location: [place];  Service: Plastics;  Laterality: Left;  . DILATION AND CURETTAGE OF UTERUS    . ENDOMETRIAL ABLATION    . IR IMAGING GUIDED PORT INSERTION  03/18/2018  . MASTECTOMY MODIFIED RADICAL Right 08/12/2018   w/axillary LND  . MASTECTOMY MODIFIED RADICAL Right 08/12/2018   Procedure: RIGHT MODIFIED RADICAL MASTECTOMY;  Surgeon: Alphonsa Overall, MD;  Location: [place];  Service: General;  Laterality: Right;  . MYOMECTOMY    . REMOVAL OF TISSUE EXPANDER AND PLACEMENT OF IMPLANT Right 10/26/2018   Procedure: Removal of right breast expander;  Surgeon: [place] Going, DO;  Location: [place];  Service: Plastics;  Laterality: Right;  75 min, please    Family Psychiatric History: Please see initial evaluation for full details. I have reviewed the history. No updates at this time.     Family History:  Family History  Problem Relation Age of Onset  . Lupus Sister   . Heart disease Sister   . Breast cancer Mother 90  . Prostate cancer Paternal Uncle   . Diabetes Maternal Grandmother   . Heart Problems Maternal Grandmother   . Prostate cancer Maternal Grandfather   . Prostate cancer Paternal Grandfather   . Prostate cancer Other        MGFs brother  . Breast cancer Other        Mat great-grandmother's sister  . Uterine cancer Maternal Great-grandmother   . Prostate cancer Other        PGFs brother    Social History:  Social History   Socioeconomic History  . Marital status: Single    Spouse name: Not on file  . Number of children: Not on file  . Years of education: Not on file  . Highest education level: Not on file  Occupational History  . Not on file  Tobacco Use  . Smoking status: Former Smoker    Packs/day: 1.00    Years: 38.00    Pack years: 38.00    Types: Cigarettes    Quit  date: 05/15/2018    Years since quitting: 1.5  . Smokeless tobacco: Never Used  Substance and Sexual Activity  . Alcohol use: Yes    Comment: occ  . Drug use: No  . Sexual activity: Not Currently    Birth control/protection: None  Other Topics Concern  . Not on file  Social History Narrative  . Not on file   Social Determinants of Health   Financial Resource Strain:   . Difficulty of Paying Living Expenses:   Food Insecurity:   . Worried About Charity fundraiser in the Last Year:   . [employer]  of Food in the Last Year:   Transportation Needs:   . Film/video editor (Medical):   Marland Kitchen Lack of Transportation (Non-Medical):   Physical Activity:   . Days of Exercise per Week:   . Minutes of Exercise per Session:   Stress:   . Feeling of Stress :   Social Connections:   . Frequency of Communication with Friends and Family:   . Frequency of Social Gatherings with Friends and Family:   . Attends Religious Services:   . Active Member of Clubs or Organizations:   . Attends Archivist Meetings:   Marland Kitchen Marital Status:     Allergies:  Allergies  Allergen Reactions  . Gadolinium Derivatives Itching and Cough    Pt began sneezing and coughing as soon as MRI contrast was injected. Severe nasal congestion. No rash or hives no SOB.   Marland Kitchen Hydrocodone Hives and Itching    Pt says she can take it with benadryl.   . Iodinated Diagnostic Agents Itching    Metabolic Disorder Labs: Lab Results  Component Value Date   HGBA1C 5.4 04/27/2014   No results found for: PROLACTIN Lab Results  Component Value Date   CHOL 201 (H) 02/18/2019   TRIG 115 02/18/2019   HDL 63 02/18/2019   CHOLHDL 3.2 02/18/2019   VLDL 23 02/18/2019   LDLCALC 115 (H) 02/18/2019   LDLCALC 76 09/24/2016   Lab Results  Component Value Date   TSH 0.58 09/24/2016   TSH 0.696 04/27/2014    Therapeutic Level Labs: No results found for: LITHIUM No results found for: VALPROATE No components found for:   CBMZ  Current Medications: Current Outpatient Medications  Medication Sig Dispense Refill  . ALPRAZolam (XANAX) 1 MG tablet 1 mg three times and 0.5 mg per day as needed for anxiety. No more than 3.5 mg per day 105 tablet 0  . ARIPiprazole (ABILIFY) 2 MG tablet Take 1 tablet (2 mg total) by mouth daily. 90 tablet 1  . esomeprazole (NEXIUM) 40 MG capsule TAKE 1 CAPSULE BY MOUTH ONCE DAILY 30 capsule 2  . fluticasone (FLONASE) 50 MCG/ACT nasal spray Place 2 sprays into both nostrils daily as needed for up to 14 days for allergies. 1 g 1  . Fluticasone-Salmeterol (ADVAIR DISKUS) 250-50 MCG/DOSE AEPB INHALE 1 PUFF INTO THE LUNGS 2 TIMES DAILY. 60 each 2  . loratadine (CLARITIN) 10 MG tablet Take 1 tablet (10 mg total) by mouth daily as needed for allergies. 30 tablet 11  . mirtazapine (REMERON) 15 MG tablet Take 1 tablet (15 mg total) by mouth at bedtime. 90 tablet 1  . naproxen (NAPROSYN) 500 MG tablet Take 1 tablet (500 mg total) by mouth 2 (two) times daily with a meal. 60 tablet 2  . omeprazole (PRILOSEC) 40 MG capsule TAKE 1 CAPSULE BY MOUTH DAILY. 30 capsule 2  . propranolol (INDERAL) 40 MG tablet Take 40 mg by mouth daily as needed (heart palpitation.).     Marland Kitchen tiZANidine (ZANAFLEX) 4 MG tablet     . venlafaxine (EFFEXOR) 75 MG tablet Take 3 tablets (225 mg total) by mouth daily. 270 tablet 0  . zolpidem (AMBIEN) 10 MG tablet Take 1 tablet (10 mg total) by mouth at bedtime as needed for sleep. 30 tablet 2   No current facility-administered medications for this visit.     Musculoskeletal: Strength & Muscle Tone: N/A Gait & Station: N/A Patient leans: N/A  Psychiatric Specialty Exam: Review of Systems  Psychiatric/Behavioral: Positive for decreased  concentration and dysphoric mood. Negative for agitation, behavioral problems, confusion, hallucinations, self-injury, sleep disturbance and suicidal ideas. The patient is nervous/anxious. The patient is not hyperactive.   All other systems  reviewed and are negative.   There were no vitals taken for this visit.There is no height or weight on file to calculate BMI.  General Appearance: Fairly Groomed  Eye Contact:  Good  Speech:  Clear and Coherent  Volume:  Normal  Mood:  Depressed  Affect:  Appropriate, Congruent, Restricted and Tearful  Thought Process:  Coherent  Orientation:  Full (Time, Place, and Person)  Thought Content: Logical   Suicidal Thoughts:  No  Homicidal Thoughts:  No  Memory:  Immediate;   Good  Judgement:  Good  Insight:  Fair  Psychomotor Activity:  Normal  Concentration:  Concentration: Good and Attention Span: Good  Recall:  Good  Fund of Knowledge: Good  Language: Good  Akathisia:  No  Handed:  Right  AIMS (if indicated): not done  Assets:  Communication Skills Desire for Improvement  ADL's:  Intact  Cognition: WNL  Sleep:  Good   Screenings: GAD-7     Office Visit from 04/22/2018 in [place] Office Visit from 06/03/2017 in [place] Office Visit from 09/24/2016 in [place] Office Visit from 03/27/2016 in [place]  Total GAD-7 Score  11  3  15  16     PHQ2-9     Office Visit from 04/22/2018 in [place] Office Visit from 06/03/2017 in [place] Office Visit from 09/24/2016 in [place] Office Visit from 05/01/2016 in [place] Office Visit from 03/27/2016 in [place]  PHQ-2 Total Score  5  0  2  5  2   PHQ-9 Total Score  15  4  7  8  10        Assessment and Plan:  Jacqueline Good is a 59 y.o. year old female with a history of depression, OCD,estrogen negativeT2 pN1, stage IIB invasive ductal carcinoma, diagnosed07/18/2019s/p mastectomy1/2020,neoadjuvantchemo immunotherapy/on trastzumab,asthma, OA,  chronic back pain , who presents for follow up appointment for MDD (major depressive disorder), recurrent episode, moderate (HCC)  Insomnia, unspecified type  # MDD, moderate,  recurrent without psychotic features # r/o PTSD She reports worsening in depression in the context of loss of her family members.  Other psychosocial stressors includes upcoming surgery for breast reconstruction, unemployment, and taking care of her mother, who has medical condition.  Although it is recommended to switch from Abilify to [name] given reported weight gain/limited benefit from higher dose of Abilify, she is not amenable to do it. She reports preference to stay on the current medication regimen.  Will continue venlafaxine to target depression.  We will continue Abilify as adjunctive treatment for depression.  Discussed risk of EPS and metabolic side effect.  We will continue mirtazapine as adjunctive treatment for depression.  Discussed potential metabolic side effect.  Noted that the timing of weight gain does not relate with the timing of starting Abilify/mirtazapine.  Will continue Xanax as needed for anxiety.  She is aware of risk of dependence and oversedation.   # Insomnia She reports improvement in insomnia. Will continue Ambien prn for insomnia.   Plan I have reviewed and updated plans as below 1. Continue venlafaxine 225 mg  daily 2.ContinueAbilify 2 mg (limited benefit from higher dose) 3.Continue mirtazapine15 mg at night  4.Continue Xanax 1 mg three times a day (and additional 0.5 mg daily) as needed for anxiety  5. Continue Ambien 10 mg at night as needed for insomnia 6. Next appointment:6/22 at 3 PM for 30 mins, video  Past trials of medication: sertraline, fluoxetine, lexapro,buspar,venlafaxine, duloxetine, bupropion, quetiapine  The patient demonstrates the following risk factors for suicide: Chronic risk factors for suicide include:psychiatric disorder ofdepressionand medical  illness of breast cancer. Acute risk factorsfor suicide include: unemployment and loss (financial, interpersonal, professional). Protective factorsfor this patient include: positive social support, responsibility to others (children, family), coping skills and hope for the future. Considering these factors, the overall suicide risk at this point appears to below. Patientisappropriate for outpatient follow up.    Norman Clay, MD 12/19/2019, 3:29 PM

## 2019-12-19 ENCOUNTER — Encounter (HOSPITAL_COMMUNITY): Payer: Self-pay | Admitting: Psychiatry

## 2019-12-19 ENCOUNTER — Other Ambulatory Visit: Payer: Self-pay

## 2019-12-19 ENCOUNTER — Telehealth (INDEPENDENT_AMBULATORY_CARE_PROVIDER_SITE_OTHER): Payer: Medicaid Other | Admitting: Psychiatry

## 2019-12-19 DIAGNOSIS — F331 Major depressive disorder, recurrent, moderate: Secondary | ICD-10-CM

## 2019-12-19 DIAGNOSIS — G4700 Insomnia, unspecified: Secondary | ICD-10-CM | POA: Diagnosis not present

## 2019-12-19 NOTE — Patient Instructions (Signed)
1. Continue venlafaxine 225 mg daily 2.ContinueAbilify 2 mg  3.Continue mirtazapine15 mg at night  4.Continue Xanax 1 mg three times a day (and additional 0.5 mg daily) as needed for anxiety  5. Continue Ambien 10 mg at night as needed for insomnia 6. Next appointment:6/22 at 3 PM

## 2019-12-20 MED ORDER — ALPRAZOLAM 1 MG PO TABS: ORAL_TABLET | ORAL | 1 refills | Status: DC

## 2019-12-20 MED ORDER — ZOLPIDEM TARTRATE 10 MG PO TABS: 10.0000 mg | ORAL_TABLET | Freq: Every evening | ORAL | 1 refills | Status: DC | PRN

## 2019-12-26 ENCOUNTER — Telehealth (HOSPITAL_COMMUNITY): Payer: Self-pay | Admitting: *Deleted

## 2019-12-26 ENCOUNTER — Other Ambulatory Visit (HOSPITAL_COMMUNITY): Payer: Self-pay | Admitting: Psychiatry

## 2019-12-26 MED ORDER — LURASIDONE HCL 20 MG PO TABS: 20.0000 mg | ORAL_TABLET | Freq: Every day | ORAL | 1 refills | Status: DC

## 2019-12-26 MED FILL — MIRTAZAPINE 15 MG TABLET: 15 | 90 days supply | Qty: 90 | Fill #1

## 2019-12-26 MED FILL — ALPRAZolam 1 MG TABS: 1 | 30 days supply | Qty: 105 | Fill #0

## 2019-12-26 NOTE — Telephone Encounter (Signed)
Per Rx  ::  XANAX 0 REFILLS AVAILABLE @ [facility]

## 2019-12-26 NOTE — Telephone Encounter (Signed)
SPOKE WITH PATIENT TO INFORM PER PROVIDER :: Ordered latuda. Please advise her to discontinue Abilify and take Latuda 20 mg in the morning after meal.   PATIENT  REQUESTED REFILL ON XANAX 0 REFILLS AVAILABLE @ [facility]

## 2019-12-26 NOTE — Telephone Encounter (Signed)
Please contact the pharmacy. Order was in.

## 2019-12-26 NOTE — Telephone Encounter (Signed)
PATIENT CALLED & STATED THAT SHE DID SOME RESEARCH ABILIFY VS LATUDA  && THAT SHE WOULD PREFER TO TRY THE LATUDA

## 2019-12-26 NOTE — Telephone Encounter (Signed)
Ordered latuda. Please advise her to discontinue Abilify and take Latuda 20 mg in the morning after meal.

## 2019-12-27 NOTE — Telephone Encounter (Signed)
Contacted the pharmacy. They do have a refill and the patient picked up Xanax yesterday.

## 2019-12-28 ENCOUNTER — Other Ambulatory Visit (HOSPITAL_COMMUNITY): Payer: Self-pay | Admitting: Psychiatry

## 2019-12-28 ENCOUNTER — Telehealth: Payer: Self-pay

## 2019-12-28 DIAGNOSIS — F411 Generalized anxiety disorder: Secondary | ICD-10-CM

## 2019-12-28 DIAGNOSIS — F3341 Major depressive disorder, recurrent, in partial remission: Secondary | ICD-10-CM

## 2019-12-28 MED ORDER — VENLAFAXINE HCL 75 MG PO TABS: 225.0000 mg | ORAL_TABLET | Freq: Every day | ORAL | 0 refills | Status: DC

## 2019-12-28 MED FILL — ESOMEPRAZOLE MAG DR 40 MG C: 40 | 30 days supply | Qty: 60 | Fill #0

## 2019-12-28 MED FILL — VENLAFAXINE HCL 75 MG TAB: 75 | 90 days supply | Qty: 270 | Fill #0

## 2019-12-28 NOTE — Telephone Encounter (Signed)
Ordered

## 2019-12-28 NOTE — Telephone Encounter (Signed)
receieved not that pt needs refills on venlafaxine hcl 75mg 

## 2019-12-30 ENCOUNTER — Telehealth (HOSPITAL_COMMUNITY): Payer: Self-pay | Admitting: *Deleted

## 2019-12-30 MED FILL — LATUDA 20 MG TABLET: 20 | 30 days supply | Qty: 30 | Fill #0

## 2019-12-30 NOTE — Telephone Encounter (Signed)
[place] TRACKS PRIOR AUTHORIZATION APPROVED lurasidone (LATUDA) 20 MG TABS tablet  PA # : [id number]                 EFFECTIVE:       12/30/2019         THRU           12/24/2020

## 2020-01-03 MED FILL — ZOLPIDEM TARTRATE 10 MG TAB: 10 | 15 days supply | Qty: 15 | Fill #1

## 2020-01-05 ENCOUNTER — Other Ambulatory Visit: Payer: Self-pay | Admitting: Adult Health

## 2020-01-16 NOTE — Progress Notes (Signed)
[facility]  [phone] Fax:([phone]) [phone]    ID: [name] DOB: [birth year]  MR#: [id number]  HAL#:[id number]  Patient Care Team: Vonna Drafts, FNP as PCP - General (Nurse Practitioner) Alphonsa Overall, MD as Consulting Physician (General Surgery) [name], [name], MD as Consulting Physician (Oncology) Eppie Gibson, MD as Attending Physician (Radiation Oncology) Dillingham, Loel Lofty, DO as Attending Physician (Plastic Surgery) Norman Clay, MD as Consulting Physician (Psychiatry) Savas, Allison Quarry, MD as Consulting Physician (Dermatology) OTHER MD: Donnal Moat, PA-C [facility] 336 [phone]   CHIEF COMPLAINT: Estrogen receptor negative breast cancer (s/p right mastectomy)  CURRENT TREATMENT: Observation   INTERVAL HISTORY: Jacqueline Good was scheduled today for follow-up of her estrogen receptor negative breast cancer. However she did not show.  REVIEW OF SYSTEMS: Jacqueline Good     HISTORY OF CURRENT ILLNESS: From the original intake note:  Jacqueline Good had routine screening mammography on 02/05/2018 showing a possible abnormality in the right breast. She underwent unilateral right diagnostic mammography with tomography and right breast ultrasonography at [facility] on 02/15/2018 showing: Highly suspicious right breast mass at 10 o'clock position.  There was a second, 0.6 cm lesion at the 10:00 radiant which has not been biopsied.  Suspicious focal cortical thickening of a single right axillary lymph node.  Accordingly on 02/25/2018 she proceeded to biopsy of the right breast area in question. 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   The patient's subsequent history  is as detailed below.   PAST MEDICAL HISTORY: Past Medical History:  Diagnosis Date  . Anemia   . Anxiety   . Arthritis    "all over" (08/12/2018)  . Asthma   . Breast cancer, right breast ([place]) dx'd 02/2018  . Chronic bronchitis ([place])   . Depression   . Family history of breast cancer   . Family history of prostate cancer   . Family history of uterine cancer   . GERD (gastroesophageal reflux disease)   . History of blood transfusion    "several; related to low blood" (08/12/2018)  . History of radiation therapy 11/15/18- 12/17/18   Right Chest wall, SCV, PAB. 25 fractions.     PAST SURGICAL HISTORY: Past Surgical History:  Procedure Laterality Date  . AXILLARY LYMPH NODE DISSECTION Right 08/12/2018   Procedure: AXILLARY LYMPH NODE DISSECTION;  Surgeon: Alphonsa Overall, MD;  Location: [facility];  Service: General;  Laterality: Right;  . BREAST BIOPSY Right 03/2018  . BREAST RECONSTRUCTION WITH PLACEMENT OF TISSUE EXPANDER AND FLEX HD (ACELLULAR HYDRATED DERMIS) Right 08/12/2018   Procedure: RIGHT BREAST RECONSTRUCTION WITH PLACEMENT OF TISSUE EXPANDER AND FLEX HD (ACELLULAR HYDRATED DERMIS);  Surgeon: Wallace Going, DO;  Location: [facility];  Service: Plastics;  Laterality: Right;  . BREAST REDUCTION SURGERY Left 09/28/2019   Procedure: LEFT MAMMARY REDUCTION/MASTOPEXY  (BREAST);  Surgeon: Wallace Going, DO;  Location: [facility];  Service: Plastics;  Laterality: Left;  . DILATION AND CURETTAGE OF UTERUS    . ENDOMETRIAL ABLATION    . IR IMAGING GUIDED PORT INSERTION  03/18/2018  . MASTECTOMY MODIFIED RADICAL Right 08/12/2018   w/axillary LND  . MASTECTOMY MODIFIED RADICAL Right 08/12/2018   Procedure: RIGHT MODIFIED RADICAL MASTECTOMY;  Surgeon: Alphonsa Overall, MD;  Location: [facility];  Service: General;  Laterality: Right;  . MYOMECTOMY    . REMOVAL OF TISSUE EXPANDER AND PLACEMENT OF IMPLANT Right 10/26/2018   Procedure: Removal of right breast expander;  Surgeon:  Wallace Going, DO;  Location: [facility];  Service: Plastics;  Laterality: Right;  75 min, please    FAMILY HISTORY: Family History  Problem Relation Age of Onset  . Lupus Sister   . Heart disease Sister   . Breast cancer Mother 60  . Prostate cancer Paternal Uncle   . Diabetes Maternal Grandmother   . Heart Problems Maternal Grandmother   . Prostate cancer Maternal Grandfather   . Prostate cancer Paternal Grandfather   . Prostate cancer Other        MGFs brother  . Breast cancer Other        Mat great-grandmother's sister  . Uterine cancer Maternal Great-grandmother   . Prostate cancer Other        PGFs brother   She notes that her father is currently 56 years old. Patients' mother is currently 74 years old and she was diagnosed with breast cancer at age 70. The patient has 4 brothers and 1 sister. She has a maternal aunt with breast cancer and her maternal great grandmother with had ovarian cancer.    GYNECOLOGIC HISTORY:  No LMP recorded. Patient is postmenopausal. Menarche: 59 years old Age at first live birth: 59 years old GX P: 1 LMP: 13-14 years ago s/p ablation Contraceptive: no HRT: no  Hysterectomy: no SO: no   SOCIAL HISTORY: She is currently unemployed.  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 She lives alone without pets. Her daughter is Jacqueline Good who lives in [place] and works in Therapist, art.  The patient has one grandchild. She goes to a [employer].   ADVANCED DIRECTIVES: Not in place.  At the 03/03/2018 visit the patient was given the appropriate documents to complete and notarized at her discretion   HEALTH MAINTENANCE: Social History   Tobacco Use  . Smoking status: Former Smoker    Packs/day: 1.00    Years: 38.00    Pack years: 38.00    Types: Cigarettes    Quit date: 05/15/2018    Years since quitting: 1.6  . Smokeless tobacco: Never Used  Substance Use Topics  . Alcohol use: Yes    Comment: occ    . Drug use: No     Colonoscopy: Never  PAP:   Bone density: Never   Allergies  Allergen Reactions  . Gadolinium Derivatives Itching and Cough    Pt began sneezing and coughing as soon as MRI contrast was injected. Severe nasal congestion. No rash or hives no SOB.   Marland Kitchen Hydrocodone Hives and Itching    Pt says she can take it with benadryl.   . Iodinated Diagnostic Agents Itching    Current Outpatient Medications  Medication Sig Dispense Refill  . ALPRAZolam (XANAX) 1 MG tablet 1 mg three times and 0.5 mg per day as needed for anxiety. No more than 3.5 mg per day 105 tablet 1  . esomeprazole (NEXIUM) 40 MG capsule TAKE 1 CAPSULE BY MOUTH ONCE DAILY 30 capsule 2  . fluticasone (FLONASE) 50 MCG/ACT nasal spray Place 2 sprays into both nostrils daily as needed for up to 14 days for allergies. 1 g 1  . Fluticasone-Salmeterol (ADVAIR DISKUS) 250-50 MCG/DOSE AEPB INHALE 1 PUFF INTO THE LUNGS 2 TIMES DAILY. 60 each 2  . loratadine (CLARITIN)  10 MG tablet Take 1 tablet (10 mg total) by mouth daily as needed for allergies. 30 tablet 11  . lurasidone (LATUDA) 20 MG TABS tablet Take 1 tablet (20 mg total) by mouth daily. 30 tablet 1  . mirtazapine (REMERON) 15 MG tablet Take 1 tablet (15 mg total) by mouth at bedtime. 90 tablet 1  . naproxen (NAPROSYN) 500 MG tablet Take 1 tablet (500 mg total) by mouth 2 (two) times daily with a meal. 60 tablet 2  . omeprazole (PRILOSEC) 40 MG capsule TAKE 1 CAPSULE BY MOUTH DAILY. 30 capsule 2  . propranolol (INDERAL) 40 MG tablet Take 40 mg by mouth daily as needed (heart palpitation.).     Marland Kitchen tiZANidine (ZANAFLEX) 4 MG tablet     . venlafaxine (EFFEXOR) 75 MG tablet Take 3 tablets (225 mg total) by mouth daily. 270 tablet 0  . zolpidem (AMBIEN) 10 MG tablet Take 1 tablet (10 mg total) by mouth at bedtime as needed for sleep. 30 tablet 1   No current facility-administered medications for this visit.    OBJECTIVE:  There were no vitals filed for this visit.    There is no height or weight on file to calculate BMI.   Wt Readings from Last 3 Encounters:  11/01/19 232 lb (105.2 kg)  10/21/19 231 lb 12.8 oz (105.1 kg)  10/18/19 235 lb 3.2 oz (106.7 kg)       LAB RESULTS:  CMP     Component Value Date/Time   NA 146 (H) 10/21/2019 1233   K 4.1 10/21/2019 1233   CL 111 10/21/2019 1233   CO2 25 10/21/2019 1233   GLUCOSE 91 10/21/2019 1233   BUN 23 (H) 10/21/2019 1233   CREATININE 0.84 10/21/2019 1233   CREATININE 0.86 01/05/2019 0832   CREATININE 0.83 09/24/2016 1151   CALCIUM 9.1 10/21/2019 1233   PROT 7.1 10/21/2019 1233   ALBUMIN 3.7 10/21/2019 1233   AST 16 10/21/2019 1233   AST 14 (L) 01/05/2019 0832   ALT 19 10/21/2019 1233   ALT 19 01/05/2019 0832   ALKPHOS 112 10/21/2019 1233   BILITOT 0.4 10/21/2019 1233   BILITOT 0.3 01/05/2019 0832   GFRNONAA >60 10/21/2019 1233   GFRNONAA >60 01/05/2019 0832   GFRNONAA 79 09/24/2016 1151   GFRAA >60 10/21/2019 1233   GFRAA >60 01/05/2019 0832   GFRAA >89 09/24/2016 1151    No results found for: TOTALPROTELP, ALBUMINELP, A1GS, A2GS, BETS, BETA2SER, GAMS, MSPIKE, SPEI  No results found for: KPAFRELGTCHN, LAMBDASER, KAPLAMBRATIO  Lab Results  Component Value Date   WBC 5.8 10/21/2019   NEUTROABS 4.2 10/21/2019   HGB 12.4 10/21/2019   HCT 39.2 10/21/2019   MCV 91.6 10/21/2019   PLT 203 10/21/2019   No results found for: LABCA2  No components found for: CZYSAY301  No results for input(s): INR in the last 168 hours.  No results found for: LABCA2  No results found for: SWF093  No results found for: ATF573  No results found for: UKG254  No results found for: CA2729  No components found for: HGQUANT  No results found for: CEA1 / No results found for: CEA1   No results found for: AFPTUMOR  No results found for: CHROMOGRNA  No results found for: HGBA, HGBA2QUANT, HGBFQUANT, HGBSQUAN (Hemoglobinopathy evaluation)   No results found for: LDH  No results found  for: IRON, TIBC, IRONPCTSAT (Iron and TIBC)  No results found for: FERRITIN  Urinalysis    Component Value Date/Time  COLORURINE CANCELED 09/24/2016 1151   APPEARANCEUR CANCELED 09/24/2016 1151   LABSPEC CANCELED 09/24/2016 1151   PHURINE CANCELED 09/24/2016 1151   GLUCOSEU CANCELED 09/24/2016 1151   HGBUR CANCELED 09/24/2016 1151   BILIRUBINUR CANCELED 09/24/2016 1151   KETONESUR CANCELED 09/24/2016 1151   PROTEINUR CANCELED 09/24/2016 1151   NITRITE CANCELED 09/24/2016 1151   LEUKOCYTESUR CANCELED 09/24/2016 1151     STUDIES:  No results found.   ELIGIBLE FOR AVAILABLE RESEARCH PROTOCOL:BCEP   ASSESSMENT: 58 y.o. [place] woman status post right breast upper outer quadrant biopsy 02/25/2018 for a clinical T2 pN1, stage IIB invasive ductal carcinoma, estrogen and progesterone receptor negative, HER-2 amplified, with an MIB-1 of 50%.  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 STK11, TP53, TSC1, TSC2, and VHL.  The following genes were evaluated for sequence changes only: SDHA and HOXB13 c.251G>A variant only.   (2) neoadjuvant chemotherapy consisting of carboplatin, docetaxel, trastuzumab and pertuzumab every 21 days x 6 starting 03/23/2018, completed 07/12/2018  (a) pertuzumab omitted after cycle 1 due to poorly-controlled diarrhea  (b) docetaxel discontinued after cycle 3 due to neuropathy; gemcitabine substituted  (c) Granix substituted  for Neulasta on days 3-6 following chemotherapy due to improved tolerance  (d) Switched back to Neulasta after cycles 5 and 6 per patient request  (3) anti-HER-2 treatment continued for 12 months, last dose 03/31/2019  (a) baseline echocardiogram 03/16/2018 shows an ejection fraction in the 55-60% range.  (b) echo on 11/15 shows EF of 55-60%  (c) echo 02/18/2019 shows EF 55-60%  (4) status post right modified radical mastectomy 08/12/2018 showing a complete pathologic response (ypT0 ypN0)  (a) a total of 11 regional lymph nodes removed  (b) expander in place  (5) adjuvant radiation 11/15/2018-12/17/2018: Right chest wall 50 Gy in 25 fractions; Right CW, SCV,PAB: 50 Gy in 25 fractions  (6) left adrenal adenoma measuring 2.9 cm on CT of the chest 03/16/2018   PLAN: Melicia did not show for her 01/17/2020 visit.  A reminder letter has been sent  Sarajane Jews C. [name], MD 01/16/20 11:02 PM Medical Oncology and Hematology [facility] [address], [place] [address] Tel. [phone]    Fax. [phone]   I, Wilburn Mylar, am acting as scribe for Dr. [name]. [name].  I, Lurline Del MD, have reviewed the above documentation for accuracy and completeness, and I agree with the above.   *Total Encounter Time as defined by the Centers for Medicare and Medicaid Services includes, in addition to the face-to-face time of a patient visit (documented in the note above) non-face-to-face time: obtaining and reviewing outside history, ordering and reviewing medications, tests or procedures, care coordination (communications with other health care professionals or caregivers) and documentation in the medical record.

## 2020-01-17 ENCOUNTER — Inpatient Hospital Stay: Payer: Medicaid Other

## 2020-01-17 ENCOUNTER — Encounter: Payer: Self-pay | Admitting: Oncology

## 2020-01-17 ENCOUNTER — Inpatient Hospital Stay: Payer: Medicaid Other | Attending: Oncology | Admitting: Oncology

## 2020-01-19 ENCOUNTER — Telehealth: Payer: Self-pay | Admitting: Oncology

## 2020-01-19 NOTE — Telephone Encounter (Signed)
Called pt per 6/9 vmail. Unable to reach pt - left message for patient to call back to reschedule.

## 2020-01-24 ENCOUNTER — Encounter: Payer: Self-pay | Admitting: Plastic Surgery

## 2020-01-24 ENCOUNTER — Other Ambulatory Visit: Payer: Self-pay

## 2020-01-24 ENCOUNTER — Ambulatory Visit (INDEPENDENT_AMBULATORY_CARE_PROVIDER_SITE_OTHER): Payer: Medicaid Other | Admitting: Plastic Surgery

## 2020-01-24 VITALS — BP 122/83 | HR 72 | Temp 97.8°F | Ht 65.0 in | Wt 237.8 lb

## 2020-01-24 DIAGNOSIS — N644 Mastodynia: Secondary | ICD-10-CM | POA: Insufficient documentation

## 2020-01-24 DIAGNOSIS — N6489 Other specified disorders of breast: Secondary | ICD-10-CM

## 2020-01-24 DIAGNOSIS — Z9011 Acquired absence of right breast and nipple: Secondary | ICD-10-CM | POA: Diagnosis not present

## 2020-01-24 NOTE — Progress Notes (Signed)
   Subjective:    Patient ID: Jacqueline Good, female    DOB: [birth year], 60 y.o.   MRN: [id number]  The patient is a 59 yrs old female here for further discussion on breast reconstruction.  The patient was diagnosed with right breast cancer in 2019 after a routine screening mammogram.  She underwent a biopsy which showed invasive ductal carcinoma grade 2 with high-grade ductal carcinoma in situ.  Her lymph nodes were involved.  She underwent a mastectomy followed by expander placement.  She had some issues with the expander in order not to delay her radiation treatment the expander was removed.  She completed her radiation treatment a year ago.  She would still like to be reconstructed.  She is 5 feet 5 inches tall and weighs 235 pounds.  She has a history of depression, arthritis, right breast cancer obesity.  Her body mass index is 39 kg/m.  She is not a smoker.  Her preference is to use her abdomen for the reconstruction.  Today she also complains of pain in her left breast.  She has a hard time describing it but says it is very similar to the pain she had in her right breast directly prior to the diagnosis of breast cancer.  She is quite worried.  She had a mammogram this year which showed high density but no areas of concern.     Review of Systems  Constitutional: Negative.   HENT: Negative.   Eyes: Negative.   Respiratory: Negative.   Cardiovascular: Negative.   Gastrointestinal: Negative.   Endocrine: Negative.   Genitourinary: Negative.   Musculoskeletal: Negative.   Hematological: Negative.   Psychiatric/Behavioral: Negative.        Objective:   Physical Exam Vitals and nursing note reviewed.  Constitutional:      Appearance: Normal appearance.  HENT:     Head: Normocephalic and atraumatic.  Cardiovascular:     Rate and Rhythm: Normal rate.     Pulses: Normal pulses.  Pulmonary:     Effort: Pulmonary effort is normal. No respiratory distress.  Abdominal:     General:  Abdomen is flat. There is no distension.  Neurological:     General: No focal deficit present.     Mental Status: She is alert. Mental status is at baseline.  Psychiatric:        Mood and Affect: Mood normal.        Behavior: Behavior normal.        Thought Content: Thought content normal.        Assessment & Plan:     ICD-10-CM   1. S/P mastectomy, right  Z90.11   2. Postoperative breast asymmetry  N64.89   3. Breast pain, left  N64.4     I would like to go ahead with an ultrasound to be sure were not missing anything on the left breast due to the breast pain.  I gave her a prescription for second to nature.  I would like to have a telemetry visit with her in 3 to 4 weeks so we can talk about the results and any future plans.  I did tell her that her body mass index could be terminated from using her abdomen for breast reconstruction.  In that case a latissimus muscle flap would be an option.

## 2020-01-24 NOTE — Addendum Note (Signed)
Addended by: Wallace Going on: 01/24/2020 04:01 PM   Modules accepted: Orders

## 2020-01-26 ENCOUNTER — Other Ambulatory Visit: Payer: Self-pay | Admitting: Primary Care

## 2020-01-26 DIAGNOSIS — J45901 Unspecified asthma with (acute) exacerbation: Secondary | ICD-10-CM

## 2020-01-26 DIAGNOSIS — J441 Chronic obstructive pulmonary disease with (acute) exacerbation: Secondary | ICD-10-CM

## 2020-01-26 MED FILL — ZOLPIDEM TARTRATE 10 MG TAB: 10 | 15 days supply | Qty: 15 | Fill #3

## 2020-01-26 MED FILL — ZOLPIDEM TARTRATE 10 MG TAB: 10 | 15 days supply | Qty: 15 | Fill #2

## 2020-01-26 MED FILL — LATUDA 20 MG TABLET: 20 | 30 days supply | Qty: 30 | Fill #1

## 2020-01-26 MED FILL — ALPRAZolam 1 MG TABS: 1 | 30 days supply | Qty: 105 | Fill #1

## 2020-01-26 NOTE — Progress Notes (Signed)
Virtual Visit via Video Note  I connected with Jacqueline Good on 01/31/20 at  3:00 PM EDT by a video enabled telemedicine application and verified that I am speaking with the correct person using two identifiers.   I discussed the limitations of evaluation and management by telemedicine and the availability of in person appointments. The patient expressed understanding and agreed to proceed.     I discussed the assessment and treatment plan with the patient. The patient was provided an opportunity to ask questions and all were answered. The patient agreed with the plan and demonstrated an understanding of the instructions.   The patient was advised to call back or seek an in-person evaluation if the symptoms worsen or if the condition fails to improve as anticipated.  Location: patient- home, provider- home office   I provided 20 minutes of non-face-to-face time during this encounter.   Norman Clay, MD    [facility] MD/PA/NP OP Progress Note  01/31/2020 3:28 PM Jacqueline Good  MRN:  [id number]  Chief Complaint:  Chief Complaint    Depression; Follow-up     HPI:  This is a follow-up appointment for depression.  She states that she is concerned about her health.  She has pain in her left breast, and will have an ultrasound.  She states that she had the similar pain in her right side when she was found to have breast cancer.  She has been worried about it all the time.  She states that her mother has been doing better.  She has been trying to read, although she has difficulty in concentration.  She visits her sister regularly.  When she is asked if she has been a little more active, she agrees that she has been doing so.  She does not want to go to the park anymore.  She is unable to do any physical exercise due to her back pain.  She does not recall what exercise she used to do with physical therapy.  However, she is amenable to try physical activity which could be beneficial for her mood as  well.  She sleeps better.  She feels depressed.  She has mild anhedonia.  She has decreased appetite, stating that she is not doing stress eating anymore.  She denies SI.  She feels anxious and tense.  She denies recent panic attacks.  She would check doors at least twice, and checks windows for security at night, which she has been doing for many years.  She occasionally feels drowsy after starting to take [name].  She agrees to try taking it at night.    Visit Diagnosis:    ICD-10-CM   1. MDD (major depressive disorder), recurrent episode, moderate (HCC)  F33.1   2. Insomnia, unspecified type  G47.00 zolpidem (AMBIEN) 10 MG tablet    Past Psychiatric History: Please see initial evaluation for full details. I have reviewed the history. No updates at this time.     Past Medical History:  Past Medical History:  Diagnosis Date  . Anemia   . Anxiety   . Arthritis    "all over" (08/12/2018)  . Asthma   . Breast cancer, right breast ([place]) dx'd 02/2018  . Chronic bronchitis ([place])   . Depression   . Family history of breast cancer   . Family history of prostate cancer   . Family history of uterine cancer   . GERD (gastroesophageal reflux disease)   . History of blood transfusion    "several; related  to low blood" (08/12/2018)  . History of radiation therapy 11/15/18- 12/17/18   Right Chest wall, SCV, PAB. 25 fractions.     Past Surgical History:  Procedure Laterality Date  . AXILLARY LYMPH NODE DISSECTION Right 08/12/2018   Procedure: AXILLARY LYMPH NODE DISSECTION;  Surgeon: Alphonsa Overall, MD;  Location: [place];  Service: General;  Laterality: Right;  . BREAST BIOPSY Right 03/2018  . BREAST RECONSTRUCTION WITH PLACEMENT OF TISSUE EXPANDER AND FLEX HD (ACELLULAR HYDRATED DERMIS) Right 08/12/2018   Procedure: RIGHT BREAST RECONSTRUCTION WITH PLACEMENT OF TISSUE EXPANDER AND FLEX HD (ACELLULAR HYDRATED DERMIS);  Surgeon: Wallace Going, DO;  Location: [place];  Service: Plastics;  Laterality:  Right;  . BREAST REDUCTION SURGERY Left 09/28/2019   Procedure: LEFT MAMMARY REDUCTION/MASTOPEXY  (BREAST);  Surgeon: Wallace Going, DO;  Location: [place];  Service: Plastics;  Laterality: Left;  . DILATION AND CURETTAGE OF UTERUS    . ENDOMETRIAL ABLATION    . IR IMAGING GUIDED PORT INSERTION  03/18/2018  . MASTECTOMY MODIFIED RADICAL Right 08/12/2018   w/axillary LND  . MASTECTOMY MODIFIED RADICAL Right 08/12/2018   Procedure: RIGHT MODIFIED RADICAL MASTECTOMY;  Surgeon: Alphonsa Overall, MD;  Location: [place];  Service: General;  Laterality: Right;  . MYOMECTOMY    . REMOVAL OF TISSUE EXPANDER AND PLACEMENT OF IMPLANT Right 10/26/2018   Procedure: Removal of right breast expander;  Surgeon: Wallace Going, DO;  Location: [place];  Service: Plastics;  Laterality: Right;  75 min, please    Family Psychiatric History: Please see initial evaluation for full details. I have reviewed the history. No updates at this time.     Family History:  Family History  Problem Relation Age of Onset  . Lupus Sister   . Heart disease Sister   . Breast cancer Mother 34  . Prostate cancer Paternal Uncle   . Diabetes Maternal Grandmother   . Heart Problems Maternal Grandmother   . Prostate cancer Maternal Grandfather   . Prostate cancer Paternal Grandfather   . Prostate cancer Other        MGFs brother  . Breast cancer Other        Mat great-grandmother's sister  . Uterine cancer Maternal Great-grandmother   . Prostate cancer Other        PGFs brother    Social History:  Social History   Socioeconomic History  . Marital status: Single    Spouse name: Not on file  . Number of children: Not on file  . Years of education: Not on file  . Highest education level: Not on file  Occupational History  . Not on file  Tobacco Use  . Smoking status: Former Smoker    Packs/day: 1.00    Years: 38.00    Pack years: 38.00    Types: Cigarettes    Quit date: 05/15/2018    Years  since quitting: 1.7  . Smokeless tobacco: Never Used  Vaping Use  . Vaping Use: Never used  Substance and Sexual Activity  . Alcohol use: Yes    Comment: occ  . Drug use: No  . Sexual activity: Not Currently    Birth control/protection: None  Other Topics Concern  . Not on file  Social History Narrative  . Not on file   Social Determinants of Health   Financial Resource Strain:   . Difficulty of Paying Living Expenses:   Food Insecurity:   . Worried About Charity fundraiser in the  Last Year:   . [place] in the Last Year:   Transportation Needs:   . Film/video editor (Medical):   Marland Kitchen Lack of Transportation (Non-Medical):   Physical Activity:   . Days of Exercise per Week:   . Minutes of Exercise per Session:   Stress:   . Feeling of Stress :   Social Connections:   . Frequency of Communication with Friends and Family:   . Frequency of Social Gatherings with Friends and Family:   . Attends Religious Services:   . Active Member of Clubs or Organizations:   . Attends Archivist Meetings:   Marland Kitchen Marital Status:     Allergies:  Allergies  Allergen Reactions  . Gadolinium Derivatives Itching and Cough    Pt began sneezing and coughing as soon as MRI contrast was injected. Severe nasal congestion. No rash or hives no SOB.   Marland Kitchen Hydrocodone Hives and Itching    Pt says she can take it with benadryl.   . Iodinated Diagnostic Agents Itching    Metabolic Disorder Labs: Lab Results  Component Value Date   HGBA1C 5.4 04/27/2014   No results found for: PROLACTIN Lab Results  Component Value Date   CHOL 201 (H) 02/18/2019   TRIG 115 02/18/2019   HDL 63 02/18/2019   CHOLHDL 3.2 02/18/2019   VLDL 23 02/18/2019   LDLCALC 115 (H) 02/18/2019   LDLCALC 76 09/24/2016   Lab Results  Component Value Date   TSH 0.58 09/24/2016   TSH 0.696 04/27/2014    Therapeutic Level Labs: No results found for: LITHIUM No results found for: VALPROATE No components  found for:  CBMZ  Current Medications: Current Outpatient Medications  Medication Sig Dispense Refill  . [START ON 02/24/2020] ALPRAZolam (XANAX) 1 MG tablet 1 mg three times and 0.5 mg per day as needed for anxiety. No more than 3.5 mg per day 105 tablet 1  . esomeprazole (NEXIUM) 40 MG capsule TAKE 1 CAPSULE BY MOUTH ONCE DAILY 30 capsule 2  . fluticasone (FLONASE) 50 MCG/ACT nasal spray Place 2 sprays into both nostrils daily as needed for up to 14 days for allergies. 1 g 1  . Fluticasone-Salmeterol (ADVAIR DISKUS) 250-50 MCG/DOSE AEPB INHALE 1 PUFF INTO THE LUNGS 2 TIMES DAILY. 60 each 2  . loratadine (CLARITIN) 10 MG tablet Take 1 tablet (10 mg total) by mouth daily as needed for allergies. 30 tablet 11  . lurasidone (LATUDA) 20 MG TABS tablet Take 1 tablet (20 mg total) by mouth daily. 30 tablet 1  . mirtazapine (REMERON) 15 MG tablet Take 1 tablet (15 mg total) by mouth at bedtime. 90 tablet 1  . naproxen (NAPROSYN) 500 MG tablet Take 1 tablet (500 mg total) by mouth 2 (two) times daily with a meal. 60 tablet 2  . omeprazole (PRILOSEC) 40 MG capsule TAKE 1 CAPSULE BY MOUTH DAILY. 30 capsule 2  . propranolol (INDERAL) 40 MG tablet Take 40 mg by mouth daily as needed (heart palpitation.).     Marland Kitchen tiZANidine (ZANAFLEX) 4 MG tablet     . venlafaxine (EFFEXOR) 75 MG tablet Take 3 tablets (225 mg total) by mouth daily. 270 tablet 0  . [START ON 02/24/2020] zolpidem (AMBIEN) 10 MG tablet Take 1 tablet (10 mg total) by mouth at bedtime as needed for sleep. 30 tablet 0   No current facility-administered medications for this visit.     Musculoskeletal: Strength & Muscle Tone: N/A Gait & Station: N/A Patient  leans: N/A  Psychiatric Specialty Exam: Review of Systems  Psychiatric/Behavioral: Positive for decreased concentration and dysphoric mood. Negative for agitation, behavioral problems, confusion, hallucinations, self-injury, sleep disturbance and suicidal ideas. The patient is  nervous/anxious. The patient is not hyperactive.   All other systems reviewed and are negative.   There were no vitals taken for this visit.There is no height or weight on file to calculate BMI.  General Appearance: Fairly Groomed  Eye Contact:  Good  Speech:  Clear and Coherent  Volume:  Normal  Mood:  Depressed  Affect:  Appropriate, Congruent and Restricted  Thought Process:  Coherent  Orientation:  Full (Time, Place, and Person)  Thought Content: Logical   Suicidal Thoughts:  No  Homicidal Thoughts:  No  Memory:  Immediate;   Good  Judgement:  Good  Insight:  Fair  Psychomotor Activity:  Normal  Concentration:  Concentration: Good and Attention Span: Good  Recall:  Good  Fund of Knowledge: Good  Language: Good  Akathisia:  No  Handed:  Right  AIMS (if indicated): not done  Assets:  Communication Skills Desire for Improvement  ADL's:  Intact  Cognition: WNL  Sleep:  Fair   Screenings: GAD-7     Office Visit from 04/22/2018 in [place] Office Visit from 06/03/2017 in [place] Office Visit from 09/24/2016 in [place] Office Visit from 03/27/2016 in [place]  Total GAD-7 Score 11 3 15 16     PHQ2-9     Office Visit from 04/22/2018 in [place] Office Visit from 06/03/2017 in [place] Office Visit from 09/24/2016 in [place] Office Visit from 05/01/2016 in [place] Office Visit from 03/27/2016 in [place]  PHQ-2 Total Score 5 0 2 5 2   PHQ-9 Total Score 15 4 7 8 10        Assessment and Plan:  LYZA HOUSEWORTH is a 59 y.o. year old female with a history of depression, OCD,estrogen negativeT2 pN1, stage IIB invasive ductal carcinoma, diagnosed07/18/2019s/p  mastectomy1/2020,neoadjuvantchemo immunotherapy/on trastzumab,asthma, OA, chronic back pain , who presents for follow up appointment for below.    1. MDD (major depressive disorder), recurrent episode, moderate (HCC) There has been slight improvement in depressive symptoms since switching from Abilify to [name].  Psychosocial stressors includes upcoming ultrasound for breast where she has pain.  Other psychosocial stressors includes loss of her family members, unemployment, and taking care of her mother with medical condition.  Will take Latuda at night given patient reports somnolence.  The hope is to do further up titration in the future to optimize the treatment.  We will continue venlafaxine to target depression.  We will continue mirtazapine as adjunctive treatment for depression.  Discussed potential metabolic side effect.  We will continue Xanax as needed for anxiety.  She is aware of the risk of dependence and oversedation.   2. Insomnia, unspecified type She reports overall improvement in insomnia.  Will continue Ambien as needed for insomnia.    Plan I have reviewed and updated plans as below 1. Continue venlafaxine 225 mg daily 2.Change Latuda 20 mg, take at night  3.Continue mirtazapine15 mg at night  4.Continue Xanax 1 mg three times a day (and additional 0.5 mg daily) as needed for anxiety   5. Continue Ambien 10 mg at  night as needed for insomnia 6. Next appointment:8/10 at 4 PM for 30 mins, video  Past trials of medication: sertraline, fluoxetine, lexapro,Buspar,venlafaxine, duloxetine, bupropion, quetiapine, Abilify  The patient demonstrates the following risk factors for suicide: Chronic risk factors for suicide include:psychiatric disorder ofdepressionand medical illness of breast cancer. Acute risk factorsfor suicide include: unemployment and loss (financial, interpersonal, professional). Protective factorsfor this patient include: positive social support,  responsibility to others (children, family), coping skills and hope for the future. Considering these factors, the overall suicide risk at this point appears to below. Patientisappropriate for outpatient follow up.  Norman Clay, MD 01/31/2020, 3:28 PM

## 2020-01-31 ENCOUNTER — Encounter (HOSPITAL_COMMUNITY): Payer: Self-pay | Admitting: Psychiatry

## 2020-01-31 ENCOUNTER — Other Ambulatory Visit: Payer: Self-pay

## 2020-01-31 ENCOUNTER — Telehealth (INDEPENDENT_AMBULATORY_CARE_PROVIDER_SITE_OTHER): Payer: Medicaid Other | Admitting: Psychiatry

## 2020-01-31 DIAGNOSIS — F331 Major depressive disorder, recurrent, moderate: Secondary | ICD-10-CM

## 2020-01-31 DIAGNOSIS — G4700 Insomnia, unspecified: Secondary | ICD-10-CM

## 2020-01-31 MED ORDER — LURASIDONE HCL 20 MG PO TABS: 20.0000 mg | ORAL_TABLET | Freq: Every day | ORAL | 1 refills | Status: DC

## 2020-01-31 MED ORDER — ALPRAZOLAM 1 MG PO TABS: ORAL_TABLET | ORAL | 1 refills | Status: DC

## 2020-01-31 MED ORDER — ZOLPIDEM TARTRATE 10 MG PO TABS: 10.0000 mg | ORAL_TABLET | Freq: Every evening | ORAL | 0 refills | Status: DC | PRN

## 2020-01-31 NOTE — Patient Instructions (Signed)
1. Continue venlafaxine 225 mg daily 2.Change Latuda 20 mg, take at night  3.Continue mirtazapine15 mg at night  4.Continue Xanax 1 mg three times a day (and additional 0.5 mg daily) as needed for anxiety   5. Continue Ambien 10 mg at night as needed for insomnia 6. Next appointment:8/10 at 4 PM

## 2020-02-09 ENCOUNTER — Inpatient Hospital Stay: Payer: Medicaid Other | Attending: Oncology | Admitting: Adult Health

## 2020-02-09 ENCOUNTER — Other Ambulatory Visit: Payer: Self-pay

## 2020-02-09 ENCOUNTER — Other Ambulatory Visit: Payer: Self-pay | Admitting: *Deleted

## 2020-02-09 ENCOUNTER — Ambulatory Visit (HOSPITAL_COMMUNITY)
Admission: RE | Admit: 2020-02-09 | Discharge: 2020-02-09 | Disposition: A | Discharge status: Home / Self Care | Payer: Medicaid Other | Source: Ambulatory Visit | Attending: Adult Health | Admitting: Adult Health

## 2020-02-09 ENCOUNTER — Inpatient Hospital Stay: Payer: Medicaid Other

## 2020-02-09 VITALS — BP 118/73 | HR 69 | Temp 97.6°F | Resp 20 | Ht 65.0 in | Wt 237.3 lb

## 2020-02-09 DIAGNOSIS — I890 Lymphedema, not elsewhere classified: Secondary | ICD-10-CM

## 2020-02-09 DIAGNOSIS — Z171 Estrogen receptor negative status [ER-]: Secondary | ICD-10-CM

## 2020-02-09 DIAGNOSIS — C50411 Malignant neoplasm of upper-outer quadrant of right female breast: Secondary | ICD-10-CM

## 2020-02-09 DIAGNOSIS — G629 Polyneuropathy, unspecified: Secondary | ICD-10-CM | POA: Diagnosis not present

## 2020-02-09 DIAGNOSIS — Z853 Personal history of malignant neoplasm of breast: Secondary | ICD-10-CM | POA: Insufficient documentation

## 2020-02-09 DIAGNOSIS — F419 Anxiety disorder, unspecified: Secondary | ICD-10-CM | POA: Insufficient documentation

## 2020-02-09 DIAGNOSIS — J449 Chronic obstructive pulmonary disease, unspecified: Secondary | ICD-10-CM | POA: Insufficient documentation

## 2020-02-09 DIAGNOSIS — Z87891 Personal history of nicotine dependence: Secondary | ICD-10-CM | POA: Diagnosis not present

## 2020-02-09 DIAGNOSIS — F329 Major depressive disorder, single episode, unspecified: Secondary | ICD-10-CM | POA: Diagnosis not present

## 2020-02-09 DIAGNOSIS — Z923 Personal history of irradiation: Secondary | ICD-10-CM | POA: Diagnosis not present

## 2020-02-09 DIAGNOSIS — Z791 Long term (current) use of non-steroidal anti-inflammatories (NSAID): Secondary | ICD-10-CM | POA: Diagnosis not present

## 2020-02-09 DIAGNOSIS — Z7951 Long term (current) use of inhaled steroids: Secondary | ICD-10-CM | POA: Insufficient documentation

## 2020-02-09 DIAGNOSIS — Z9011 Acquired absence of right breast and nipple: Secondary | ICD-10-CM | POA: Diagnosis present

## 2020-02-09 DIAGNOSIS — Z79899 Other long term (current) drug therapy: Secondary | ICD-10-CM | POA: Diagnosis not present

## 2020-02-09 LAB — CBC WITH DIFFERENTIAL (CANCER CENTER ONLY)
Abs Immature Granulocytes: 0.02 10*3/uL (ref 0.00–0.07)
Basophils Absolute: 0 10*3/uL (ref 0.0–0.1)
Basophils Relative: 0 %
Eosinophils Absolute: 0.1 10*3/uL (ref 0.0–0.5)
Eosinophils Relative: 2 %
HCT: 39.3 % (ref 36.0–46.0)
Hemoglobin: 12.6 g/dL (ref 12.0–15.0)
Immature Granulocytes: 0 %
Lymphocytes Relative: 19 %
Lymphs Abs: 1.3 10*3/uL (ref 0.7–4.0)
MCH: 28.8 pg (ref 26.0–34.0)
MCHC: 32.1 g/dL (ref 30.0–36.0)
MCV: 89.9 fL (ref 80.0–100.0)
Monocytes Absolute: 0.5 10*3/uL (ref 0.1–1.0)
Monocytes Relative: 7 %
Neutro Abs: 5.1 10*3/uL (ref 1.7–7.7)
Neutrophils Relative %: 72 %
Platelet Count: 250 10*3/uL (ref 150–400)
RBC: 4.37 MIL/uL (ref 3.87–5.11)
RDW: 14.2 % (ref 11.5–15.5)
WBC Count: 7.1 10*3/uL (ref 4.0–10.5)
nRBC: 0 % (ref 0.0–0.2)

## 2020-02-09 LAB — CMP (CANCER CENTER ONLY)
ALT: 28 U/L (ref 0–44)
AST: 18 U/L (ref 15–41)
Albumin: 3.9 g/dL (ref 3.5–5.0)
Alkaline Phosphatase: 112 U/L (ref 38–126)
Anion gap: 11 (ref 5–15)
BUN: 18 mg/dL (ref 6–20)
CO2: 24 mmol/L (ref 22–32)
Calcium: 9.4 mg/dL (ref 8.9–10.3)
Chloride: 106 mmol/L (ref 98–111)
Creatinine: 0.92 mg/dL (ref 0.44–1.00)
GFR, Est AFR Am: >60 mL/min (ref >60)
GFR, Estimated: >60 mL/min (ref >60)
Glucose, Bld: 87 mg/dL (ref 70–99)
Potassium: 4 mmol/L (ref 3.5–5.1)
Sodium: 141 mmol/L (ref 135–145)
Total Bilirubin: 0.5 mg/dL (ref 0.3–1.2)
Total Protein: 7.6 g/dL (ref 6.5–8.1)

## 2020-02-09 MED FILL — tiZANidine HCL 4 MG TABS: 4 | 30 days supply | Qty: 60 | Fill #1

## 2020-02-09 MED FILL — ESOMEPRAZOLE MAG DR 40 MG C: 40 | 30 days supply | Qty: 30 | Fill #2

## 2020-02-09 NOTE — Progress Notes (Signed)
Right upper extremity venous duplex completed. Refer to "CV Proc" under chart review to view preliminary results.  02/09/2020 2:12 PM Kelby Aline., MHA, RVT, RDCS, RDMS

## 2020-02-09 NOTE — Progress Notes (Signed)
[place]  [phone] [phone]    ID: Jacqueline Good DOB: Aug 11, 1961  MR#: [id number]  QZE#:[id number]  Patient Care Team: Vonna Drafts, FNP as PCP - General (Nurse Practitioner) Alphonsa Overall, MD as Consulting Physician (General Surgery) [name], [name], MD as Consulting Physician (Oncology) Eppie Gibson, MD as Attending Physician (Radiation Oncology) Dillingham, Loel Lofty, DO as Attending Physician (Plastic Surgery) Norman Clay, MD as Consulting Physician (Psychiatry) Savas, Allison Quarry, MD as Consulting Physician (Dermatology) OTHER MD: Donnal Moat, PA-C [facility] [phone]]   CHIEF COMPLAINT: Estrogen receptor negative breast cancer (s/p right mastectomy)  CURRENT TREATMENT: Observation   INTERVAL HISTORY: Jacqueline Good is here today for right arm and chest wall swelling.  She underwent a doppler before today's visit which was negative.  She notes the swelling has been present for a couple of weeks, and is worsening.  She has some mild pain in the area from the fullness.    REVIEW OF SYSTEMS: Jacqueline Good denies any fever, chills, chest pain, palpitations, cough, shortness of breath, bowel/bladder changes, headaches, vision changes, or any other concerns.  A detailed ROS was otherwise non contributory.    HISTORY OF CURRENT ILLNESS: From the original intake note:  Jacqueline Good had routine screening mammography on 02/05/2018 showing a possible abnormality in the right breast. She underwent unilateral right diagnostic mammography with tomography and right breast ultrasonography at [facility] on 02/15/2018 showing: Highly suspicious right breast mass at 10 o'clock position.  There was a second, 0.6 cm lesion at the 10:00 radiant which has not been biopsied.  Suspicious focal cortical thickening of a single right axillary lymph node.  Accordingly on 02/25/2018 she proceeded to biopsy of the right breast area in question. 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   The patient's subsequent history is as detailed below.   PAST MEDICAL HISTORY: Past Medical History:  Diagnosis Date  . Anemia   . Anxiety   . Arthritis    "all over" (08/12/2018)  . Asthma   . Breast cancer, right breast ([place]) dx'd 02/2018  . Chronic bronchitis ([place])   . Depression   . Family history of breast cancer   . Family history of prostate cancer   . Family history of uterine cancer   . GERD (gastroesophageal reflux disease)   . History of blood transfusion    "several; related to low blood" (08/12/2018)  . History of radiation therapy 11/15/18- 12/17/18   Right Chest wall, SCV, PAB. 25 fractions.     PAST SURGICAL HISTORY: Past Surgical History:  Procedure Laterality Date  . AXILLARY LYMPH NODE DISSECTION Right 08/12/2018   Procedure: AXILLARY LYMPH NODE DISSECTION;  Surgeon: Alphonsa Overall, MD;  Location: [facility];  Service: General;  Laterality: Right;  . BREAST BIOPSY Right 03/2018  . BREAST RECONSTRUCTION WITH PLACEMENT OF TISSUE EXPANDER AND FLEX HD (ACELLULAR HYDRATED DERMIS) Right 08/12/2018   Procedure: RIGHT BREAST RECONSTRUCTION WITH PLACEMENT OF TISSUE EXPANDER AND FLEX HD (ACELLULAR HYDRATED DERMIS);  Surgeon: [facility] Going, DO;  Location: [place];  Service: Plastics;  Laterality: Right;  . BREAST REDUCTION SURGERY Left 09/28/2019   Procedure: LEFT MAMMARY REDUCTION/MASTOPEXY  (BREAST);  Surgeon: [facility] Going, DO;  Location: [facility];  Service: Clinical cytogeneticist;  Laterality: Left;  . DILATION AND CURETTAGE OF UTERUS    . ENDOMETRIAL ABLATION    . IR IMAGING GUIDED PORT INSERTION  03/18/2018  . MASTECTOMY MODIFIED RADICAL Right 08/12/2018    w/axillary LND  . MASTECTOMY MODIFIED RADICAL Right 08/12/2018   Procedure: RIGHT MODIFIED RADICAL MASTECTOMY;  Surgeon: Alphonsa Overall, MD;  Location: [facility];  Service: General;  Laterality: Right;  . MYOMECTOMY    . REMOVAL OF TISSUE EXPANDER AND PLACEMENT OF IMPLANT Right 10/26/2018   Procedure: Removal of right breast expander;  Surgeon: [place] Going, DO;  Location: [facility];  Service: Plastics;  Laterality: Right;  75 min, please    FAMILY HISTORY: Family History  Problem Relation Age of Onset  . Lupus Sister   . Heart disease Sister   . Breast cancer Mother 41  . Prostate cancer Paternal Uncle   . Diabetes Maternal Grandmother   . Heart Problems Maternal Grandmother   . Prostate cancer Maternal Grandfather   . Prostate cancer Paternal Grandfather   . Prostate cancer Other        MGFs brother  . Breast cancer Other        Mat great-grandmother's sister  . Uterine cancer Maternal Great-grandmother   . Prostate cancer Other        PGFs brother   She notes that her father is currently 8 years old. Patients' mother is currently 72 years old and she was diagnosed with breast cancer at age 24. The patient has 4 brothers and 1 sister. She has a maternal aunt with breast cancer and her maternal great grandmother with had ovarian cancer.    GYNECOLOGIC HISTORY:  No LMP recorded. Patient is postmenopausal. Menarche: 59 years old Age at first live birth: 59 years old GX P: 1 LMP: 13-14 years ago s/p ablation Contraceptive: no HRT: no  Hysterectomy: no SO: no   SOCIAL HISTORY: She is currently unemployed.  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 She lives alone without pets. Her daughter is [name] who lives in [place] and works in Therapist, art.  The patient has one grandchild. She goes to a [employer].   ADVANCED DIRECTIVES: Not in place.  At the 03/03/2018 visit the patient was given the appropriate documents to complete and  notarized at her discretion   HEALTH MAINTENANCE: Social History   Tobacco Use  . Smoking status: Former Smoker    Packs/day: 1.00    Years: 38.00    Pack years: 38.00    Types: Cigarettes    Quit date: 05/15/2018    Years since quitting: 1.7  . Smokeless tobacco: Never Used  Vaping Use  . Vaping Use: Never used  Substance Use Topics  . Alcohol use: Yes    Comment: occ  . Drug use: No     Colonoscopy: Never  PAP:   Bone density: Never   Allergies  Allergen Reactions  . Gadolinium Derivatives Itching and Cough    Pt began sneezing and coughing as soon as MRI contrast was injected. Severe nasal congestion. No rash or hives no SOB.   Marland Kitchen Hydrocodone Hives and Itching    Pt says she can take it with benadryl.   . Iodinated Diagnostic Agents Itching    Current Outpatient Medications  Medication Sig Dispense Refill  . [START ON 02/24/2020] ALPRAZolam (XANAX) 1 MG tablet 1 mg three times and 0.5 mg per day as needed for anxiety. No more than 3.5 mg  per day 105 tablet 1  . esomeprazole (NEXIUM) 40 MG capsule TAKE 1 CAPSULE BY MOUTH ONCE DAILY 30 capsule 2  . Fluticasone-Salmeterol (ADVAIR DISKUS) 250-50 MCG/DOSE AEPB INHALE 1 PUFF INTO THE LUNGS 2 TIMES DAILY. 60 each 2  . loratadine (CLARITIN) 10 MG tablet Take 1 tablet (10 mg total) by mouth daily as needed for allergies. 30 tablet 11  . lurasidone (LATUDA) 20 MG TABS tablet Take 1 tablet (20 mg total) by mouth daily. 30 tablet 1  . mirtazapine (REMERON) 15 MG tablet Take 1 tablet (15 mg total) by mouth at bedtime. 90 tablet 1  . naproxen (NAPROSYN) 500 MG tablet Take 1 tablet (500 mg total) by mouth 2 (two) times daily with a meal. 60 tablet 2  . omeprazole (PRILOSEC) 40 MG capsule TAKE 1 CAPSULE BY MOUTH DAILY. 30 capsule 2  . propranolol (INDERAL) 40 MG tablet Take 40 mg by mouth daily as needed (heart palpitation.).     Marland Kitchen tiZANidine (ZANAFLEX) 4 MG tablet     . venlafaxine (EFFEXOR) 75 MG tablet Take 3 tablets (225 mg total)  by mouth daily. 270 tablet 0  . [START ON 02/24/2020] zolpidem (AMBIEN) 10 MG tablet Take 1 tablet (10 mg total) by mouth at bedtime as needed for sleep. 30 tablet 0  . fluticasone (FLONASE) 50 MCG/ACT nasal spray Place 2 sprays into both nostrils daily as needed for up to 14 days for allergies. 1 g 1   No current facility-administered medications for this visit.    OBJECTIVE:  Vitals:   02/09/20 1455  BP: 118/73  Pulse: 69  Resp: 20  Temp: 97.6 F (36.4 C)  SpO2: 98%     Body mass index is 39.49 kg/m.   Wt Readings from Last 3 Encounters:  02/09/20 237 lb 4.8 oz (107.6 kg)  01/24/20 237 lb 12.8 oz (107.9 kg)  11/01/19 232 lb (105.2 kg)   GENERAL: Patient is a well appearing female in no acute distress HEENT:  Sclerae anicteric.  Mask in place Neck is supple.  NODES:  No cervical, supraclavicular, or axillary lymphadenopathy palpated.  BREAST EXAM:  Right chest wall and right arm with swelling noted, no erythema, no warmth, no drainage, mild TTP on right lateral chest wall, no sign of breast cancer recurrence.   LUNGS:  Clear to auscultation bilaterally.  No wheezes or rhonchi. HEART:  Regular rate and rhythm. No murmur appreciated. ABDOMEN:  Soft, nontender.  Positive, normoactive bowel sounds. No organomegaly palpated. MSK:  No focal spinal tenderness to palpation. Full range of motion bilaterally in the upper extremities. EXTREMITIES:  + right arm swelling  SKIN:  Clear with no obvious rashes or skin changes. No nail dyscrasia. NEURO:  Nonfocal. Well oriented.  Appropriate affect.     LAB RESULTS:  CMP     Component Value Date/Time   NA 141 02/09/2020 1409   K 4.0 02/09/2020 1409   CL 106 02/09/2020 1409   CO2 24 02/09/2020 1409   GLUCOSE 87 02/09/2020 1409   BUN 18 02/09/2020 1409   CREATININE 0.92 02/09/2020 1409   CREATININE 0.83 09/24/2016 1151   CALCIUM 9.4 02/09/2020 1409   PROT 7.6 02/09/2020 1409   ALBUMIN 3.9 02/09/2020 1409   AST 18 02/09/2020 1409    ALT 28 02/09/2020 1409   ALKPHOS 112 02/09/2020 1409   BILITOT 0.5 02/09/2020 1409   GFRNONAA >60 02/09/2020 1409   GFRNONAA 79 09/24/2016 1151   GFRAA >60 02/09/2020 1409   GFRAA >89  09/24/2016 1151    No results found for: TOTALPROTELP, ALBUMINELP, A1GS, A2GS, BETS, BETA2SER, GAMS, MSPIKE, SPEI  No results found for: KPAFRELGTCHN, LAMBDASER, [facility]  Lab Results  Component Value Date   WBC 7.1 02/09/2020   NEUTROABS 5.1 02/09/2020   HGB 12.6 02/09/2020   HCT 39.3 02/09/2020   MCV 89.9 02/09/2020   PLT 250 02/09/2020   No results found for: LABCA2  No components found for: [id number]  No results for input(s): INR in the last 168 hours.  No results found for: LABCA2  No results found for: YQM578  No results found for: ION629  No results found for: BMW413  No results found for: CA2729  No components found for: HGQUANT  No results found for: CEA1 / No results found for: CEA1   No results found for: AFPTUMOR  No results found for: CHROMOGRNA  No results found for: HGBA, HGBA2QUANT, HGBFQUANT, HGBSQUAN (Hemoglobinopathy evaluation)   No results found for: LDH  No results found for: IRON, TIBC, IRONPCTSAT (Iron and TIBC)  No results found for: FERRITIN  Urinalysis    Component Value Date/Time   COLORURINE CANCELED 09/24/2016 1151   APPEARANCEUR CANCELED 09/24/2016 1151   LABSPEC CANCELED 09/24/2016 1151   PHURINE CANCELED 09/24/2016 1151   GLUCOSEU CANCELED 09/24/2016 1151   HGBUR CANCELED 09/24/2016 1151   BILIRUBINUR CANCELED 09/24/2016 1151   KETONESUR CANCELED 09/24/2016 1151   PROTEINUR CANCELED 09/24/2016 1151   NITRITE CANCELED 09/24/2016 1151   LEUKOCYTESUR CANCELED 09/24/2016 1151     STUDIES:  VAS [place] UPPER EXTREMITY VENOUS DUPLEX  Result Date: 02/09/2020 UPPER VENOUS STUDY  Indications: Swelling, and Edema Comparison Study: No prior study Performing Technologist: Maudry Mayhew MHA, RDMS, RVT, RDCS  Examination Guidelines: A  complete evaluation includes B-mode imaging, spectral Doppler, color Doppler, and power Doppler as needed of all accessible portions of each vessel. Bilateral testing is considered an integral part of a complete examination. Limited examinations for reoccurring indications may be performed as noted.  Right Findings: +----------+------------+---------+-----------+----------+-------+ RIGHT     CompressiblePhasicitySpontaneousPropertiesSummary +----------+------------+---------+-----------+----------+-------+ IJV           Full       Yes       Yes                      +----------+------------+---------+-----------+----------+-------+ Subclavian    Full       Yes       Yes                      +----------+------------+---------+-----------+----------+-------+ Axillary      Full       Yes       Yes                      +----------+------------+---------+-----------+----------+-------+ Brachial      Full       Yes       Yes                      +----------+------------+---------+-----------+----------+-------+ Radial        Full                                          +----------+------------+---------+-----------+----------+-------+ Ulnar         Full                                          +----------+------------+---------+-----------+----------+-------+  Cephalic      Full                                          +----------+------------+---------+-----------+----------+-------+ Basilic       Full                                          +----------+------------+---------+-----------+----------+-------+  Left Findings: +----------+------------+---------+-----------+----------+-------+ LEFT      CompressiblePhasicitySpontaneousPropertiesSummary +----------+------------+---------+-----------+----------+-------+ Subclavian               Yes       Yes                      +----------+------------+---------+-----------+----------+-------+   Summary:  Right: No evidence of deep vein thrombosis in the upper extremity. No evidence of superficial vein thrombosis in the upper extremity.  Left: No evidence of thrombosis in the subclavian.  *See table(s) above for measurements and observations.    Preliminary      ELIGIBLE FOR AVAILABLE RESEARCH PROTOCOL:BCEP   ASSESSMENT: 59 y.o. Jacqueline Good status post right breast upper outer quadrant biopsy 02/25/2018 for a clinical T2 pN1, stage IIB invasive ductal carcinoma, estrogen and progesterone receptor negative, HER-2 amplified, with an MIB-1 of 50%.  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 STK11, TP53, TSC1, TSC2, and VHL.  The following genes were evaluated for sequence changes only: SDHA and HOXB13 c.251G>A variant only.   (2) neoadjuvant chemotherapy consisting of carboplatin, docetaxel, trastuzumab and pertuzumab every 21 days x 6 starting 03/23/2018, completed 07/12/2018  (a) pertuzumab omitted after cycle 1 due to poorly-controlled diarrhea  (b) docetaxel discontinued after cycle 3 due to neuropathy; gemcitabine substituted  (c) Granix substituted for Neulasta on days 3-6 following chemotherapy due to improved tolerance  (d) Switched back to Neulasta after cycles 5 and 6 per patient request  (3) anti-HER-2 treatment continued for 12 months, last dose 03/31/2019  (a) baseline echocardiogram 03/16/2018 shows an ejection fraction in the 55-60%  range.  (b) echo on 11/15 shows EF of 55-60%  (c) echo 02/18/2019 shows EF 55-60%  (4) status post right modified radical mastectomy 08/12/2018 showing a complete pathologic response (ypT0 ypN0)  (a) a total of 11 regional lymph nodes removed  (b) expander in place  (5) adjuvant radiation 11/15/2018-12/17/2018: Right chest wall 50 Gy in 25 fractions; Right CW, SCV,PAB: 50 Gy in 25 fractions  (6) left adrenal adenoma measuring 2.9 cm on CT of the chest 03/16/2018   PLAN: Jacqueline Good is here today for a new onset of right arm swelling.  Doppler was thankfully negative for a DVT.  Her exam is consistent with lymphedema.  I reviewed this with her and the need for her to see PT for evaluation and treatment of her lymphedema.   Jacqueline Good will f/u with [place] in 6 months with labs and  an office visit.  I recommended healthy diet and exercise in the meantime.  I also recommended that she continue to f/u with her pcp for her overall health maintenance.    She knows to call for any questions that may arise between now and her next appointment.  We are happy to see her sooner if needed.  Total encounter time: 20 minutes*  Wilber Bihari, NP 02/10/20 1:47 PM Medical Oncology and Hematology [facility] [place], [place] [address] Tel. [phone]    Fax. [phone]      *Total Encounter Time as defined by the Centers for Medicare and Medicaid Services includes, in addition to the face-to-face time of a patient visit (documented in the note above) non-face-to-face time: obtaining and reviewing outside history, ordering and reviewing medications, tests or procedures, care coordination (communications with other health care professionals or caregivers) and documentation in the medical record.

## 2020-02-10 ENCOUNTER — Telehealth: Payer: Self-pay | Admitting: Adult Health

## 2020-02-10 NOTE — Telephone Encounter (Signed)
Scheduled appts per 7/1 los. No changes made to pt's schedule.

## 2020-02-14 ENCOUNTER — Telehealth (INDEPENDENT_AMBULATORY_CARE_PROVIDER_SITE_OTHER): Payer: Medicaid Other | Admitting: Plastic Surgery

## 2020-02-14 ENCOUNTER — Encounter: Payer: Self-pay | Admitting: Plastic Surgery

## 2020-02-14 ENCOUNTER — Other Ambulatory Visit: Payer: Self-pay

## 2020-02-14 DIAGNOSIS — N6489 Other specified disorders of breast: Secondary | ICD-10-CM | POA: Diagnosis not present

## 2020-02-14 DIAGNOSIS — Z9011 Acquired absence of right breast and nipple: Secondary | ICD-10-CM | POA: Diagnosis not present

## 2020-02-14 NOTE — Progress Notes (Addendum)
The patient is a 59 year old female who joined me by a phone visit today.  She had a right mastectomy with reconstruction.  Reconstruction was abandoned due to complications.  Today she stated that she noticed some swelling in her right arm that really concerned her.  She was worried about a blood clot.  She had an ultrasound done which was negative.  She has been set up with physical therapy and should start that later this week.  She is still interested in reconstruction but understands we will need to resolve any lymphedema prior to surgery.  She will give [place] a call back in the next few weeks once she is doing better and has started her physical therapy.  ROS: As stated above.  No other changes.  Assesement: Acquired absence of breast   I connected with  [name] on 02/14/20 by a phone telemedicine application and verified that I am speaking with the correct person using two identifiers. We spent 5 minutes in discussion and charting. I was at the office and the patient was at home.   I discussed the limitations of evaluation and management by telemedicine. The patient expressed understanding and agreed to proceed.

## 2020-02-15 ENCOUNTER — Ambulatory Visit: Payer: Medicaid Other | Attending: Adult Health

## 2020-02-15 ENCOUNTER — Other Ambulatory Visit: Payer: Self-pay

## 2020-02-15 DIAGNOSIS — Z171 Estrogen receptor negative status [ER-]: Secondary | ICD-10-CM | POA: Insufficient documentation

## 2020-02-15 DIAGNOSIS — C50411 Malignant neoplasm of upper-outer quadrant of right female breast: Secondary | ICD-10-CM | POA: Insufficient documentation

## 2020-02-15 DIAGNOSIS — G8929 Other chronic pain: Secondary | ICD-10-CM

## 2020-02-15 DIAGNOSIS — M25511 Pain in right shoulder: Secondary | ICD-10-CM | POA: Insufficient documentation

## 2020-02-15 DIAGNOSIS — M25611 Stiffness of right shoulder, not elsewhere classified: Secondary | ICD-10-CM | POA: Diagnosis present

## 2020-02-15 DIAGNOSIS — I972 Postmastectomy lymphedema syndrome: Secondary | ICD-10-CM

## 2020-02-15 NOTE — Therapy (Signed)
[place], [place], [address] Phone: [phone]   Fax:  [phone]  Physical Therapy Evaluation  Patient Details  Name: Jacqueline Good MRN: [id number] Date of Birth: 02/27/1961 Referring Provider (PT): Wilber Bihari NP   Encounter Date: 02/15/2020   PT End of Session - 02/15/20 1611    Visit Number 1    Number of Visits 4    Date for PT Re-Evaluation 03/21/20    Authorization Type Medicaid prepaid    PT Start Time 1410    PT Stop Time 1450    PT Time Calculation (min) 40 min    Activity Tolerance Patient tolerated treatment well    Behavior During Therapy [facility] for tasks assessed/performed           Past Medical History:  Diagnosis Date  . Anemia   . Anxiety   . Arthritis    "all over" (08/12/2018)  . Asthma   . Breast cancer, right breast ([place]) dx'd 02/2018  . Chronic bronchitis ([place])   . Depression   . Family history of breast cancer   . Family history of prostate cancer   . Family history of uterine cancer   . GERD (gastroesophageal reflux disease)   . History of blood transfusion    "several; related to low blood" (08/12/2018)  . History of radiation therapy 11/15/18- 12/17/18   Right Chest wall, SCV, PAB. 25 fractions.     Past Surgical History:  Procedure Laterality Date  . AXILLARY LYMPH NODE DISSECTION Right 08/12/2018   Procedure: AXILLARY LYMPH NODE DISSECTION;  Surgeon: Alphonsa Overall, MD;  Location: [place];  Service: General;  Laterality: Right;  . BREAST BIOPSY Right 03/2018  . BREAST RECONSTRUCTION WITH PLACEMENT OF TISSUE EXPANDER AND FLEX HD (ACELLULAR HYDRATED DERMIS) Right 08/12/2018   Procedure: RIGHT BREAST RECONSTRUCTION WITH PLACEMENT OF TISSUE EXPANDER AND FLEX HD (ACELLULAR HYDRATED DERMIS);  Surgeon: Wallace Going, DO;  Location: [place];  Service: Plastics;  Laterality: Right;  . BREAST REDUCTION SURGERY Left 09/28/2019   Procedure: LEFT MAMMARY REDUCTION/MASTOPEXY   (BREAST);  Surgeon: Wallace Going, DO;  Location: [place];  Service: Plastics;  Laterality: Left;  . DILATION AND CURETTAGE OF UTERUS    . ENDOMETRIAL ABLATION    . IR IMAGING GUIDED PORT INSERTION  03/18/2018  . MASTECTOMY MODIFIED RADICAL Right 08/12/2018   w/axillary LND  . MASTECTOMY MODIFIED RADICAL Right 08/12/2018   Procedure: RIGHT MODIFIED RADICAL MASTECTOMY;  Surgeon: Alphonsa Overall, MD;  Location: [place];  Service: General;  Laterality: Right;  . MYOMECTOMY    . REMOVAL OF TISSUE EXPANDER AND PLACEMENT OF IMPLANT Right 10/26/2018   Procedure: Removal of right breast expander;  Surgeon: Wallace Going, DO;  Location: [place];  Service: Plastics;  Laterality: Right;  75 min, please    There were no vitals filed for this visit.    Subjective Assessment - 02/15/20 1409    Subjective Pt states that she started to notice swelling in her R breast and RUE about 2.5 weeks ago. She states that she went to her primary care provider and was sent to get a doppler. Her doppler was negative.    Pertinent History status post right modified radical mastectomy 08/12/2018 showing a complete pathologic response, 11 lymph nodes removed and radiation    Diagnostic tests doppler wtih no significant findings for the RUE    Patient Stated Goals I want to get rid of the lymphedema.  Currently in Pain? Yes    Pain Score 5     Pain Location Breast    Pain Orientation Right    Pain Descriptors / Indicators Aching    Pain Type Surgical pain    Pain Radiating Towards RUE    Pain Onset 1 to 4 weeks ago    Pain Frequency Intermittent    Aggravating Factors  later in the day and her bra    Pain Relieving Factors unknown    Effect of Pain on Daily Activities She reports her ROM of her RUE is getting worse.              [facility] PT Assessment - 02/15/20 0001      Assessment   Medical Diagnosis R breast cancer/lymphedema     Referring Provider (PT) Wilber Bihari NP    Onset  Date/Surgical Date 08/13/19    Hand Dominance Right    Prior Therapy yes after radiation and surgery       Precautions   Precautions Other (comment)    Precaution Comments breast cancer, lymphedema       Restrictions   Weight Bearing Restrictions No      Balance Screen   Has the patient fallen in the past 6 months No    Has the patient had a decrease in activity level because of a fear of falling?  No    Is the patient reluctant to leave their home because of a fear of falling?  No      Home Environment   Living Environment Private residence    Living Arrangements Alone    Type of [place] to enter    Entrance Stairs-Number of Steps 2    Entrance Stairs-Rails None    Additional Comments no difficulty with stairs      Prior Function   Level of Independence Independent    Vocation On disability    Leisure Pt does not exercise.       Cognition   Overall Cognitive Status Within Functional Limits for tasks assessed      Posture/Postural Control   Posture/Postural Control Postural limitations    Postural Limitations Rounded Shoulders;Forward head      ROM / Strength   AROM / PROM / Strength AROM      AROM   AROM Assessment Site Shoulder    Right/Left Shoulder Right;Left    Right Shoulder Flexion 116 Degrees    Right Shoulder ABduction 90 Degrees    Right Shoulder Internal Rotation 64 Degrees    Right Shoulder External Rotation 85 Degrees    Left Shoulder Flexion 155 Degrees    Left Shoulder ABduction 140 Degrees    Left Shoulder Internal Rotation 85 Degrees    Left Shoulder External Rotation 94 Degrees             LYMPHEDEMA/ONCOLOGY QUESTIONNAIRE - 02/15/20 0001      Type   Cancer Type Right breast and RUE lymphedema      Surgeries   Mastectomy Date 08/12/18    Axillary Lymph Node Dissection Date 08/12/18    Number Lymph Nodes Removed 11      Treatment   Active Chemotherapy Treatment No    Past Chemotherapy Treatment Yes     Active Radiation Treatment No    Past Radiation Treatment Yes    Current Hormone Treatment No    Past Hormone Therapy No      What other symptoms do you  have   Are you Having Heaviness or Tightness Yes    Are you having Pain Yes    Are you having pitting edema No    Is it Hard or Difficult finding clothes that fit No    Do you have infections Yes    Comments 3x after R expander placed 2019    Is there Decreased scar mobility Yes      Lymphedema Stage   Stage STAGE 2 SPONTANEOUSLY IRREVERSIBLE      Lymphedema Assessments   Lymphedema Assessments Upper extremities      Right Upper Extremity Lymphedema   15 cm Proximal to Olecranon Process 36.2 cm    10 cm Proximal to Olecranon Process 35.3 cm    Olecranon Process 28.5 cm    15 cm Proximal to Ulnar Styloid Process 29.9 cm    10 cm Proximal to Ulnar Styloid Process 26 cm    Just Proximal to Ulnar Styloid Process 18 cm    Across Hand at [employer] 17.8 cm    At [place] of 2nd Digit 6.6 cm    Other 107.5 on full exhalation axillary line    Other 116 cm on full exhalation nipple line    Other 105 cm on full exhalation under the breast      Left Upper Extremity Lymphedema   15 cm Proximal to Olecranon Process 37 cm    10 cm Proximal to Olecranon Process 32.9 cm    Olecranon Process 26 cm    15 cm Proximal to Ulnar Styloid Process 24.8 cm    10 cm Proximal to Ulnar Styloid Process 20.8 cm    Just Proximal to Ulnar Styloid Process 15.9 cm    Across Hand at [employer] 17.3 cm    At [place] of 2nd Digit 6.5 cm                   Objective measurements completed on examination: See above findings.               PT Education - 02/15/20 1556    Education Details Pt was educated on the anatomy and physiology of the lymphatic system. Discussed MLD and compression for the R breast and UE. She was educated on possible use of a Jovipak with compression bra and a compression sleeve for the RUE.    Person(s)  Educated Patient    Methods Explanation    Comprehension Verbalized understanding            PT Short Term Goals - 02/15/20 1623      PT SHORT TERM GOAL #1   Title Pt will be independent with HEP and MLD within 2 weeks in order to demonstrate autonomy of care.    Baseline pt does not have HEP and has difficulty performing MLD    Time 2    Period Weeks    Status New    Target Date 03/07/20             PT Long Term Goals - 02/15/20 1623      PT LONG TERM GOAL #1   Title Pt will have 3 cm decrease in circumferential measurements from the mid antebrachium to distal brachium within 3 weeks in order to demonstrate improvement in lymphedema.    Baseline see measurements    Time 3    Period Weeks    Status New    Target Date 03/14/20      PT LONG TERM GOAL #  2   Title Pt will report 50% improvement in pain and function of the RUE and breast in order to demonstrate a subjective improvement in quality of life.    Baseline 5/10 pain    Time 3    Period Weeks    Status New    Target Date 03/14/20      PT LONG TERM GOAL #3   Title Pt will be fitted for appropriate compression garments for home management of lymphedema.    Baseline currently pt has compression bras that are over 6 months old.    Time 3    Period Weeks    Status New    Target Date 03/14/20      PT LONG TERM GOAL #4   Title Pt will demonstrate 145 degrees R shoulder flexion and 120 degrees R shoulder abduction in order to improve functional ROM of the R shoulder.    Baseline R shoulder flexion 116, abduction 90 degrees.    Time 3    Period Weeks    Status New    Target Date 03/14/20                  Plan - 02/15/20 1446    Clinical Impression Statement Pt presents to physical therapy status post R masectomy with 11 lymph node removal and radiation on the R in 2019. She previously had no swelling in her RUE and R breast but over the past 2.5 weeks she has noticed painful swelling in her R breast  along with swelling in her RUE. She had a doppler done that was negative. Circumferential measurements show a significant increase in RUE measurements from the wrist to the mid brachium compared to the L and she is demonstrating decreased shoulder ROM in the R shoulder. Pt will benefit from skilled physical therapy services in order to address the above limitations 1x/week for 3 weeks initially.    Personal Factors and Comorbidities Comorbidity 1    Comorbidities R modified radical masectomy with radiation and 11 lymph node removal with ALND    PT Treatment/Interventions Manual techniques;Therapeutic exercise;Therapeutic activities;Neuromuscular re-education    PT Next Visit Plan teach MLD and perform MLD for the R breast RUE, remind pt of appointment with Melissa, ROM/stretching and gradual strengthening for the RUE/scapulohumeral rhythm work.    PT Home Exercise Plan Signed up on 03/02/2020 with Melissa 10:30    Recommended Other Services vasopneumatic pump, new compression bra, inframammary jovipak, compression sleeve    Consulted and Agree with Plan of Care Patient           Patient will benefit from skilled therapeutic intervention in order to improve the following deficits and impairments:     Visit Diagnosis: Postmastectomy lymphedema  Stiffness of right shoulder, not elsewhere classified  Malignant neoplasm of upper-outer quadrant of right breast in female, estrogen receptor negative ([place])  Chronic right shoulder pain     Problem List Patient Active Problem List   Diagnosis Date Noted  . Breast pain, left 01/24/2020  . Postoperative breast asymmetry 09/13/2019  . Genetic testing 10/01/2018  . Family history of breast cancer   . Family history of uterine cancer   . Family history of prostate cancer   . S/P mastectomy, right 08/20/2018  . Acquired absence of right breast 08/20/2018  . Port-A-Cath in place 06/21/2018  . MDD (major depressive disorder), recurrent episode,  moderate ([place]) 06/13/2018  . Generalized anxiety disorder 06/13/2018  . OCD (obsessive compulsive disorder) 06/13/2018  .  Insomnia 06/13/2018  . Malignant neoplasm of upper-outer quadrant of right breast in female, estrogen receptor negative ([place]) 03/02/2018  . Needs flu shot 06/03/2017  . Chronic bilateral low back pain without sciatica 06/03/2017  . Adjustment disorder with anxious mood 03/27/2016  . Benign paroxysmal positional vertigo 10/31/2014  . Arthritis 04/27/2014  . Fatigue 06/10/2013  . Morbid obesity ([place]) 06/10/2013  . Asthma, chronic obstructive, with acute exacerbation ([place]) 06/10/2013  . Allergy 06/10/2013    Ander Purpura, PT 02/15/2020, 4:30 PM  [place], [place], [address] Phone: [phone]   Fax:  [phone]  Name: Jacqueline Good MRN: [id number] Date of Birth: [birth year]

## 2020-02-16 ENCOUNTER — Other Ambulatory Visit: Payer: Self-pay | Admitting: Primary Care

## 2020-02-16 DIAGNOSIS — J45901 Unspecified asthma with (acute) exacerbation: Secondary | ICD-10-CM

## 2020-02-16 DIAGNOSIS — J441 Chronic obstructive pulmonary disease with (acute) exacerbation: Secondary | ICD-10-CM

## 2020-02-21 ENCOUNTER — Ambulatory Visit
Admission: RE | Admit: 2020-02-21 | Discharge: 2020-02-21 | Disposition: A | Discharge status: Home / Self Care | Payer: Medicaid Other | Source: Ambulatory Visit | Attending: Adult Health | Admitting: Adult Health

## 2020-02-21 ENCOUNTER — Other Ambulatory Visit: Payer: Self-pay | Admitting: Adult Health

## 2020-02-21 ENCOUNTER — Other Ambulatory Visit: Payer: Self-pay

## 2020-02-21 DIAGNOSIS — Z171 Estrogen receptor negative status [ER-]: Secondary | ICD-10-CM

## 2020-02-21 DIAGNOSIS — N6489 Other specified disorders of breast: Secondary | ICD-10-CM

## 2020-02-21 DIAGNOSIS — C50411 Malignant neoplasm of upper-outer quadrant of right female breast: Secondary | ICD-10-CM

## 2020-02-21 HISTORY — DX: Personal history of antineoplastic chemotherapy: Z92.21

## 2020-02-21 HISTORY — DX: Personal history of irradiation: Z92.3

## 2020-02-23 ENCOUNTER — Ambulatory Visit
Admission: RE | Admit: 2020-02-23 | Discharge: 2020-02-23 | Disposition: A | Discharge status: Home / Self Care | Payer: Medicaid Other | Source: Ambulatory Visit | Attending: Adult Health | Admitting: Adult Health

## 2020-02-23 ENCOUNTER — Other Ambulatory Visit: Payer: Self-pay

## 2020-02-23 ENCOUNTER — Other Ambulatory Visit: Payer: Self-pay | Admitting: Primary Care

## 2020-02-23 DIAGNOSIS — N6489 Other specified disorders of breast: Secondary | ICD-10-CM

## 2020-02-23 DIAGNOSIS — J45901 Unspecified asthma with (acute) exacerbation: Secondary | ICD-10-CM

## 2020-02-23 DIAGNOSIS — J441 Chronic obstructive pulmonary disease with (acute) exacerbation: Secondary | ICD-10-CM

## 2020-02-24 MED FILL — ALPRAZolam 1 MG TABS: 1 | 30 days supply | Qty: 105 | Fill #0

## 2020-02-24 MED FILL — LATUDA 20 MG TABLET: 20 | 30 days supply | Qty: 30 | Fill #0

## 2020-02-24 MED FILL — ZOLPIDEM TARTRATE 10 MG TAB: 10 | 30 days supply | Qty: 15 | Fill #1

## 2020-02-24 MED FILL — ZOLPIDEM TARTRATE 10 MG TAB: 10 | 30 days supply | Qty: 15 | Fill #0

## 2020-02-29 ENCOUNTER — Other Ambulatory Visit: Payer: Self-pay

## 2020-02-29 ENCOUNTER — Ambulatory Visit: Payer: Medicaid Other

## 2020-02-29 DIAGNOSIS — G8929 Other chronic pain: Secondary | ICD-10-CM

## 2020-02-29 DIAGNOSIS — Z171 Estrogen receptor negative status [ER-]: Secondary | ICD-10-CM

## 2020-02-29 DIAGNOSIS — I972 Postmastectomy lymphedema syndrome: Secondary | ICD-10-CM | POA: Diagnosis not present

## 2020-02-29 DIAGNOSIS — C50411 Malignant neoplasm of upper-outer quadrant of right female breast: Secondary | ICD-10-CM

## 2020-02-29 DIAGNOSIS — M25611 Stiffness of right shoulder, not elsewhere classified: Secondary | ICD-10-CM

## 2020-02-29 NOTE — Patient Instructions (Signed)
Start with circles near neck above collarbones, 10 times each side.    Cancer Rehab [phone] Deep Effective Breath   Standing, sitting, or laying down, place both hands on the belly. Take a deep breath IN, expanding the belly; then breath OUT, contracting the belly. Repeat __5__ times. Do __2-3__ sessions per day and before your self massage.  Axilla to Axilla - Sweep   On uninvolved side make 5 circles in the armpit, then pump _5__ times from involved armpit across chest to uninvolved armpit, making a pathway. Do _1__ time per day.   Axilla to Inguinal Nodes - Sweep   On involved side, make 5 circles at groin at panty line, then pump _5__ times from armpit along side of trunk to outer hip, making your other pathway. Do __1_ time per day.  Copyright  VHI. All rights reserved.  Arm Posterior: Elbow to Shoulder - Sweep   Pump _5__ times from back of elbow to top of shoulder. Then inner to outer upper arm _5_ times, then outer arm again _5_ times. Then back to the pathways _2-3_ times. Do _1__ time per day.  Copyright  VHI. All rights reserved.  ARM: [place] Wrist to Elbow - Sweep   Pump or stationary circles _5__ times from wrist to elbow making sure to do both sides of the forearm. Then retrace your steps to the outer upper arm, and the pathways _2-3_ times each. Do _1__ time per day.  Copyright  VHI. All rights reserved.  ARM: Dorsum of Hand to Shoulder - Sweep   Pump or stationary circles _5__ times on back of hand including knuckle spaces and individual fingers if needed working up towards the wrist, then retrace all your steps working back up the forearm, doing both sides; upper outer arm and back to your pathways _2-3_ times each. Then do 5 circles again at uninvolved armpit and involved groin where you started! Good job!! Do __1_ time per day.

## 2020-02-29 NOTE — Therapy (Signed)
[place], [place], [address] Phone: [phone]   Fax:  [phone]  Physical Therapy Treatment  Patient Details  Name: Jacqueline Good MRN: [id number] Date of Birth: June 29, 1961 Referring Provider (PT): Wilber Bihari NP   Encounter Date: 02/29/2020   PT End of Session - 02/29/20 1224    Visit Number 2    Number of Visits 4    Date for PT Re-Evaluation 03/21/20    Authorization Type Medicaid prepaid    PT Start Time 1106    PT Stop Time 1207    PT Time Calculation (min) 61 min    Activity Tolerance Patient tolerated treatment well    Behavior During Therapy [facility] for tasks assessed/performed           Past Medical History:  Diagnosis Date   Anemia    Anxiety    Arthritis    "all over" (08/12/2018)   Asthma    Breast cancer, right breast ([place]) dx'd 02/2018   Chronic bronchitis ([place])    Depression    Family history of breast cancer    Family history of prostate cancer    Family history of uterine cancer    GERD (gastroesophageal reflux disease)    History of blood transfusion    "several; related to low blood" (08/12/2018)   History of radiation therapy 11/15/18- 12/17/18   Right Chest wall, SCV, PAB. 25 fractions.    Personal history of chemotherapy    Personal history of radiation therapy     Past Surgical History:  Procedure Laterality Date   AXILLARY LYMPH NODE DISSECTION Right 08/12/2018   Procedure: AXILLARY LYMPH NODE DISSECTION;  Surgeon: Alphonsa Overall, MD;  Location: [place];  Service: General;  Laterality: Right;   BREAST BIOPSY Right 03/2018   BREAST RECONSTRUCTION WITH PLACEMENT OF TISSUE EXPANDER AND FLEX HD (ACELLULAR HYDRATED DERMIS) Right 08/12/2018   Procedure: RIGHT BREAST RECONSTRUCTION WITH PLACEMENT OF TISSUE EXPANDER AND FLEX HD (ACELLULAR HYDRATED DERMIS);  Surgeon: Wallace Going, DO;  Location: [place];  Service: Plastics;  Laterality: Right;   BREAST  REDUCTION SURGERY Left 09/28/2019   Procedure: LEFT MAMMARY REDUCTION/MASTOPEXY  (BREAST);  Surgeon: Wallace Going, DO;  Location: [place];  Service: Plastics;  Laterality: Left;   DILATION AND CURETTAGE OF UTERUS     ENDOMETRIAL ABLATION     IR IMAGING GUIDED PORT INSERTION  03/18/2018   MASTECTOMY MODIFIED RADICAL Right 08/12/2018   Procedure: RIGHT MODIFIED RADICAL MASTECTOMY;  Surgeon: Alphonsa Overall, MD;  Location: [place];  Service: General;  Laterality: Right;   MASTECTOMY MODIFIED RADICAL Right 08/12/2018   w/axillary LND   MYOMECTOMY     REDUCTION MAMMAPLASTY Left 2021   REMOVAL OF TISSUE EXPANDER AND PLACEMENT OF IMPLANT Right 10/26/2018   Procedure: Removal of right breast expander;  Surgeon: Wallace Going, DO;  Location: [place];  Service: Plastics;  Laterality: Right;  75 min, please    There were no vitals filed for this visit.   Subjective Assessment - 02/29/20 1228    Subjective My compression pump arrived and the rep is coming tomorrow to set that up for me.    Pertinent History status post right modified radical mastectomy 08/12/2018 showing a complete pathologic response, 11 lymph nodes removed and radiation    Patient Stated Goals I want to get rid of the lymphedema.  [facility] Adult PT Treatment/Exercise - 02/29/20 0001      Manual Therapy   Manual Therapy Myofascial release;Manual Lymphatic Drainage (MLD);Passive ROM    Myofascial Release To Rt axilla during P/ROM    Manual Lymphatic Drainage (MLD) In Supine: Short neck, 5 diaphragmatic breaths, Rt inguinal and Lt axillary nodes, then Rt axillo-inguinal (focusing on "pocket" of swelling inferior to axilla) and anterior inter-axillary anastomosis, then Rt UE working from lateral upper arm to dorsum of hand then retracing all steps beginning to instruct pt throughout and had her return demo. VCs and hand over hand pressure used to instruct light  pressure and skin stretch.    Passive ROM In supine to Rt shoulder into flexion, abduction and er to pts tolerance                    PT Short Term Goals - 02/15/20 1623      PT SHORT TERM GOAL #1   Title Pt will be independent with HEP and MLD within 2 weeks in order to demonstrate autonomy of care.    Baseline pt does not have HEP and has difficulty performing MLD    Time 2    Period Weeks    Status New    Target Date 03/07/20             PT Long Term Goals - 02/15/20 1623      PT LONG TERM GOAL #1   Title Pt will have 3 cm decrease in circumferential measurements from the mid antebrachium to distal brachium within 3 weeks in order to demonstrate improvement in lymphedema.    Baseline see measurements    Time 3    Period Weeks    Status New    Target Date 03/14/20      PT LONG TERM GOAL #2   Title Pt will report 50% improvement in pain and function of the RUE and breast in order to demonstrate a subjective improvement in quality of life.    Baseline 5/10 pain    Time 3    Period Weeks    Status New    Target Date 03/14/20      PT LONG TERM GOAL #3   Title Pt will be fitted for appropriate compression garments for home management of lymphedema.    Baseline currently pt has compression bras that are over 6 months old.    Time 3    Period Weeks    Status New    Target Date 03/14/20      PT LONG TERM GOAL #4   Title Pt will demonstrate 145 degrees R shoulder flexion and 120 degrees R shoulder abduction in order to improve functional ROM of the R shoulder.    Baseline R shoulder flexion 116, abduction 90 degrees.    Time 3    Period Weeks    Status New    Target Date 03/14/20                 Plan - 02/29/20 1225    Clinical Impression Statement Began manual lymph drainage of Rt UE today and instructing pt in this throughout. Had her return demo and by end of session and instruction she was able to return fairly good demo but will benefit from  further instruction and reinforcement of technique and pressure. Also began P/ROM with MFR to Rt shoulder and axilla which she reported very good stretches felt at end of motion. Pt was reminded about her appt  Friday to be measured for compression. Her compression pump arrived and she reports the rep is coming to her house tomorrow to set this up.    Personal Factors and Comorbidities Comorbidity 1    Comorbidities R modified radical masectomy with radiation and 11 lymph node removal with ALND    PT Frequency 1x / week    PT Duration --   due to [facility]   PT Treatment/Interventions Manual techniques;Therapeutic exercise;Therapeutic activities;Neuromuscular re-education    PT Next Visit Plan Cont instructing and perform MLD for the RUE, ROM/stretching and gradual strengthening for the RUE/scapulohumeral rhythm work.    PT Home Exercise Plan Signed up on 03/02/2020 with Melissa 10:30    Consulted and Agree with Plan of Care Patient           Patient will benefit from skilled therapeutic intervention in order to improve the following deficits and impairments:     Visit Diagnosis: Postmastectomy lymphedema  Stiffness of right shoulder, not elsewhere classified  Malignant neoplasm of upper-outer quadrant of right breast in female, estrogen receptor negative ([place])  Chronic right shoulder pain     Problem List Patient Active Problem List   Diagnosis Date Noted   Breast pain, left 01/24/2020   Postoperative breast asymmetry 09/13/2019   Genetic testing 10/01/2018   Family history of breast cancer    Family history of uterine cancer    Family history of prostate cancer    S/P mastectomy, right 08/20/2018   Acquired absence of right breast 08/20/2018   Port-A-Cath in place 06/21/2018   MDD (major depressive disorder), recurrent episode, moderate ([place]) 06/13/2018   Generalized anxiety disorder 06/13/2018   OCD (obsessive compulsive disorder) 06/13/2018   Insomnia  06/13/2018   Malignant neoplasm of upper-outer quadrant of right breast in female, estrogen receptor negative ([place]) 03/02/2018   Needs flu shot 06/03/2017   Chronic bilateral low back pain without sciatica 06/03/2017   Adjustment disorder with anxious mood 03/27/2016   Benign paroxysmal positional vertigo 10/31/2014   Arthritis 04/27/2014   Fatigue 06/10/2013   Morbid obesity ([place]) 06/10/2013   Asthma, chronic obstructive, with acute exacerbation ([place]) 06/10/2013   Allergy 06/10/2013    Otelia Limes, PTA 02/29/2020, 12:30 PM  [place], [place], [address] Phone: [phone]   Fax:  [phone]  Name: Jacqueline Good MRN: [id number] Date of Birth: [birth year]

## 2020-03-06 NOTE — Progress Notes (Signed)
Received fax from [facility] requesting signed order for [place] for lymphedema. Order signed by Wilber Bihari, NP and faxed back to [facility] at [phone]. Fax conf. Received.

## 2020-03-08 ENCOUNTER — Other Ambulatory Visit: Payer: Self-pay | Admitting: Primary Care

## 2020-03-08 DIAGNOSIS — J45901 Unspecified asthma with (acute) exacerbation: Secondary | ICD-10-CM

## 2020-03-08 DIAGNOSIS — J441 Chronic obstructive pulmonary disease with (acute) exacerbation: Secondary | ICD-10-CM

## 2020-03-08 NOTE — Telephone Encounter (Signed)
Requested medication (s) are due for refill today: Yes  Requested medication (s) are on the active medication list: Yes  Last refill:  12/22/18  Future visit scheduled: No  Notes to clinic:  Ordered a year ago by Juluis Mire, but can not find any recent visits.    Requested Prescriptions  Pending Prescriptions Disp Refills   ADVAIR DISKUS 250-50 MCG/DOSE AEPB [Pharmacy Med Name: ADVAIR 250/50 DISKUS 250-50 Aerosol] 60 each 2    Sig: INHALE 1 PUFF INTO THE LUNGS 2 TIMES DAILY.      Pulmonology:  Combination Products Failed - 03/08/2020  2:24 PM      Failed - Valid encounter within last 12 months    Recent Outpatient Visits           1 year ago MDD (major depressive disorder), recurrent episode, moderate ([place])   [place], Milford Cage, NP   1 year ago Reactive depression   [place] [name], MD   2 years ago Chronic bilateral low back pain without sciatica   [place], Marlena Clipper, MD   3 years ago Morbid obesity due to excess calories [facility])   [place], Marlena Clipper, MD   3 years ago Adjustment disorder with anxious mood   [facility], Marlena Clipper, MD

## 2020-03-13 MED FILL — ADVAIR 250/50 DISKUS: 250-50 | 30 days supply | Qty: 60 | Fill #0

## 2020-03-13 MED FILL — LATUDA 20 MG TABLET: 20 | 30 days supply | Qty: 30 | Fill #0

## 2020-03-14 ENCOUNTER — Telehealth: Payer: Self-pay

## 2020-03-14 NOTE — Telephone Encounter (Signed)
[employer] Medicaid approved for recertification x 12 months (02/08/2021-02/07/2021). MID # [id number].

## 2020-03-15 ENCOUNTER — Other Ambulatory Visit: Payer: Self-pay

## 2020-03-15 ENCOUNTER — Ambulatory Visit: Payer: Medicaid Other | Attending: Adult Health

## 2020-03-15 DIAGNOSIS — M25611 Stiffness of right shoulder, not elsewhere classified: Secondary | ICD-10-CM | POA: Insufficient documentation

## 2020-03-15 DIAGNOSIS — Z171 Estrogen receptor negative status [ER-]: Secondary | ICD-10-CM | POA: Diagnosis present

## 2020-03-15 DIAGNOSIS — I972 Postmastectomy lymphedema syndrome: Secondary | ICD-10-CM | POA: Diagnosis present

## 2020-03-15 DIAGNOSIS — G8929 Other chronic pain: Secondary | ICD-10-CM

## 2020-03-15 DIAGNOSIS — M25511 Pain in right shoulder: Secondary | ICD-10-CM | POA: Insufficient documentation

## 2020-03-15 DIAGNOSIS — C50411 Malignant neoplasm of upper-outer quadrant of right female breast: Secondary | ICD-10-CM | POA: Diagnosis present

## 2020-03-15 MED FILL — PROAIR HFA 90 MCG INHALER: 108 (90 BAS | 16 days supply | Qty: 9 | Fill #0

## 2020-03-15 NOTE — Progress Notes (Addendum)
Virtual Visit via Video Note  I connected with Jacqueline Good on 03/20/20 at  4:00 PM EDT by a video enabled telemedicine application and verified that I am speaking with the correct person using two identifiers.   I discussed the limitations of evaluation and management by telemedicine and the availability of in person appointments. The patient expressed understanding and agreed to proceed.     I discussed the assessment and treatment plan with the patient. The patient was provided an opportunity to ask questions and all were answered. The patient agreed with the plan and demonstrated an understanding of the instructions.   The patient was advised to call back or seek an in-person evaluation if the symptoms worsen or if the condition fails to improve as anticipated.  Location: patient- home, provider- home office   I provided 15 minutes of non-face-to-face time during this encounter.   Norman Clay, MD    [facility] MD/PA/NP OP Progress Note  03/20/2020 4:33 PM Jacqueline Good  MRN:  [id number]  Chief Complaint:  Chief Complaint    Depression; Follow-up; Anxiety     HPI:  This is a follow-up appointment for depression, anxiety and insomnia.  She states that she has been having mood swing.  She feels okay to med and sad. She is expecting a physical therapist to come for equipment for massage. When she is coached self compassion, she states that she always take care of herself as she does not have any body else to take care of her. She states that ultrasound for breast was negative for malignancy. Her mother is doing well. She has middle insomnia.  She feels down.  She has difficulty in concentration; she has difficulty keeping up with appointments.  She has fair appetite.  She denies any change in weight.  She denies SI.  She feels anxious and tense.  She denies panic attacks.  She ran out of [name] for a while due to lack of stock in the pharmacy.  She reinitiated about a week ago.  She  denies any drowsiness since taking it at night.     Visit Diagnosis:    ICD-10-CM   1. MDD (major depressive disorder), recurrent episode, mild (HCC)  F33.0 mirtazapine (REMERON) 15 MG tablet    venlafaxine (EFFEXOR) 75 MG tablet  2. Generalized anxiety disorder  F41.1 mirtazapine (REMERON) 15 MG tablet    venlafaxine (EFFEXOR) 75 MG tablet  3. Insomnia, unspecified type  G47.00 zolpidem (AMBIEN) 10 MG tablet    Past Psychiatric History: Please see initial evaluation for full details. I have reviewed the history. No updates at this time.     Past Medical History:  Past Medical History:  Diagnosis Date  . Anemia   . Anxiety   . Arthritis    "all over" (08/12/2018)  . Asthma   . Breast cancer, right breast ([place]) dx'd 02/2018  . Chronic bronchitis ([place])   . Depression   . Family history of breast cancer   . Family history of prostate cancer   . Family history of uterine cancer   . GERD (gastroesophageal reflux disease)   . History of blood transfusion    "several; related to low blood" (08/12/2018)  . History of radiation therapy 11/15/18- 12/17/18   Right Chest wall, SCV, PAB. 25 fractions.   . Personal history of chemotherapy   . Personal history of radiation therapy     Past Surgical History:  Procedure Laterality Date  . AXILLARY LYMPH NODE DISSECTION Right  08/12/2018   Procedure: AXILLARY LYMPH NODE DISSECTION;  Surgeon: Alphonsa Overall, MD;  Location: [place];  Service: General;  Laterality: Right;  . BREAST BIOPSY Right 03/2018  . BREAST RECONSTRUCTION WITH PLACEMENT OF TISSUE EXPANDER AND FLEX HD (ACELLULAR HYDRATED DERMIS) Right 08/12/2018   Procedure: RIGHT BREAST RECONSTRUCTION WITH PLACEMENT OF TISSUE EXPANDER AND FLEX HD (ACELLULAR HYDRATED DERMIS);  Surgeon: Wallace Going, DO;  Location: [place];  Service: Plastics;  Laterality: Right;  . BREAST REDUCTION SURGERY Left 09/28/2019   Procedure: LEFT MAMMARY REDUCTION/MASTOPEXY  (BREAST);  Surgeon: Wallace Going, DO;   Location: [place];  Service: Plastics;  Laterality: Left;  . DILATION AND CURETTAGE OF UTERUS    . ENDOMETRIAL ABLATION    . IR IMAGING GUIDED PORT INSERTION  03/18/2018  . MASTECTOMY MODIFIED RADICAL Right 08/12/2018   Procedure: RIGHT MODIFIED RADICAL MASTECTOMY;  Surgeon: Alphonsa Overall, MD;  Location: [place];  Service: General;  Laterality: Right;  . MASTECTOMY MODIFIED RADICAL Right 08/12/2018   w/axillary LND  . MYOMECTOMY    . REDUCTION MAMMAPLASTY Left 2021  . REMOVAL OF TISSUE EXPANDER AND PLACEMENT OF IMPLANT Right 10/26/2018   Procedure: Removal of right breast expander;  Surgeon: Wallace Going, DO;  Location: [place];  Service: Plastics;  Laterality: Right;  75 min, please    Family Psychiatric History: Please see initial evaluation for full details. I have reviewed the history. No updates at this time.     Family History:  Family History  Problem Relation Age of Onset  . Lupus Sister   . Heart disease Sister   . Breast cancer Mother 89  . Prostate cancer Paternal Uncle   . Diabetes Maternal Grandmother   . Heart Problems Maternal Grandmother   . Prostate cancer Maternal Grandfather   . Prostate cancer Paternal Grandfather   . Prostate cancer Other        MGFs brother  . Breast cancer Other        Mat great-grandmother's sister  . Uterine cancer Maternal Great-grandmother   . Prostate cancer Other        PGFs brother    Social History:  Social History   Socioeconomic History  . Marital status: Single    Spouse name: Not on file  . Number of children: Not on file  . Years of education: Not on file  . Highest education level: Not on file  Occupational History  . Not on file  Tobacco Use  . Smoking status: Former Smoker    Packs/day: 1.00    Years: 38.00    Pack years: 38.00    Types: Cigarettes    Quit date: 05/15/2018    Years since quitting: 1.8  . Smokeless tobacco: Never Used  Vaping Use  . Vaping Use: Never used  Substance and  Sexual Activity  . Alcohol use: Yes    Comment: occ  . Drug use: No  . Sexual activity: Not Currently    Birth control/protection: None  Other Topics Concern  . Not on file  Social History Narrative  . Not on file   Social Determinants of Health   Financial Resource Strain:   . Difficulty of Paying Living Expenses:   Food Insecurity:   . Worried About Charity fundraiser in the Last Year:   . Arboriculturist in the Last Year:   Transportation Needs:   . Film/video editor (Medical):   Marland Kitchen Lack of Transportation (Non-Medical):  Physical Activity:   . Days of Exercise per Week:   . Minutes of Exercise per Session:   Stress:   . Feeling of Stress :   Social Connections:   . Frequency of Communication with Friends and Family:   . Frequency of Social Gatherings with Friends and Family:   . Attends Religious Services:   . Active Member of Clubs or Organizations:   . Attends Archivist Meetings:   Marland Kitchen Marital Status:     Allergies:  Allergies  Allergen Reactions  . Gadolinium Derivatives Itching and Cough    Pt began sneezing and coughing as soon as MRI contrast was injected. Severe nasal congestion. No rash or hives no SOB.   Marland Kitchen Hydrocodone Hives and Itching    Pt says she can take it with benadryl.   . Iodinated Diagnostic Agents Itching    Metabolic Disorder Labs: Lab Results  Component Value Date   HGBA1C 5.4 04/27/2014   No results found for: PROLACTIN Lab Results  Component Value Date   CHOL 201 (H) 02/18/2019   TRIG 115 02/18/2019   HDL 63 02/18/2019   CHOLHDL 3.2 02/18/2019   VLDL 23 02/18/2019   LDLCALC 115 (H) 02/18/2019   LDLCALC 76 09/24/2016   Lab Results  Component Value Date   TSH 0.58 09/24/2016   TSH 0.696 04/27/2014    Therapeutic Level Labs: No results found for: LITHIUM No results found for: VALPROATE No components found for:  CBMZ  Current Medications: Current Outpatient Medications  Medication Sig Dispense Refill  .  [START ON 03/24/2020] ALPRAZolam (XANAX) 1 MG tablet 1 mg three times and 0.5 mg per day as needed for anxiety. No more than 3.5 mg per day 105 tablet 1  . esomeprazole (NEXIUM) 40 MG capsule TAKE 1 CAPSULE BY MOUTH ONCE DAILY 30 capsule 2  . fluticasone (FLONASE) 50 MCG/ACT nasal spray Place 2 sprays into both nostrils daily as needed for up to 14 days for allergies. 1 g 1  . Fluticasone-Salmeterol (ADVAIR DISKUS) 250-50 MCG/DOSE AEPB INHALE 1 PUFF INTO THE LUNGS 2 TIMES DAILY. 60 each 2  . loratadine (CLARITIN) 10 MG tablet Take 1 tablet (10 mg total) by mouth daily as needed for allergies. 30 tablet 11  . lurasidone (LATUDA) 20 MG TABS tablet Take 1 tablet (20 mg total) by mouth daily. 30 tablet 1  . mirtazapine (REMERON) 15 MG tablet Take 1 tablet (15 mg total) by mouth at bedtime. 90 tablet 0  . naproxen (NAPROSYN) 500 MG tablet Take 1 tablet (500 mg total) by mouth 2 (two) times daily with a meal. 60 tablet 2  . omeprazole (PRILOSEC) 40 MG capsule TAKE 1 CAPSULE BY MOUTH DAILY. 30 capsule 2  . propranolol (INDERAL) 40 MG tablet Take 40 mg by mouth daily as needed (heart palpitation.).     Marland Kitchen tiZANidine (ZANAFLEX) 4 MG tablet     . [START ON 03/26/2020] venlafaxine (EFFEXOR) 75 MG tablet Take 3 tablets (225 mg total) by mouth daily. 270 tablet 0  . [START ON 03/24/2020] zolpidem (AMBIEN) 10 MG tablet Take 1 tablet (10 mg total) by mouth at bedtime as needed for sleep. 30 tablet 1   No current facility-administered medications for this visit.     Musculoskeletal: Strength & Muscle Tone: N/A Gait & Station: N/A Patient leans: N/A  Psychiatric Specialty Exam: Review of Systems  Psychiatric/Behavioral: Positive for dysphoric mood and sleep disturbance. Negative for agitation, behavioral problems, confusion, decreased concentration, hallucinations, self-injury and suicidal  ideas. The patient is nervous/anxious. The patient is not hyperactive.   All other systems reviewed and are negative.    There were no vitals taken for this visit.There is no height or weight on file to calculate BMI.  General Appearance: Fairly Groomed  Eye Contact:  Good  Speech:  Clear and Coherent  Volume:  Normal  Mood:  same  Affect:  Appropriate, Congruent and Restricted  Thought Process:  Coherent  Orientation:  Full (Time, Place, and Person)  Thought Content: Logical   Suicidal Thoughts:  No  Homicidal Thoughts:  No  Memory:  Immediate;   Good  Judgement:  Good  Insight:  Fair  Psychomotor Activity:  Normal  Concentration:  Concentration: Good and Attention Span: Good  Recall:  Good  Fund of Knowledge: Good  Language: Good  Akathisia:  No  Handed:  Right  AIMS (if indicated): not done  Assets:  Communication Skills Desire for Improvement  ADL's:  Intact  Cognition: WNL  Sleep:  Poor   Screenings: GAD-7     Office Visit from 04/22/2018 in [facility] Office Visit from 06/03/2017 in [place] Office Visit from 09/24/2016 in [place] Office Visit from 03/27/2016 in [place]  Total GAD-7 Score 11 3 15 16     PHQ2-9     Office Visit from 04/22/2018 in [place] Office Visit from 06/03/2017 in [place] Office Visit from 09/24/2016 in [place] Office Visit from 05/01/2016 in [place] Office Visit from 03/27/2016 in [place]  PHQ-2 Total Score 5 0 2 5 2   PHQ-9 Total Score 15 4 7 8 10        Assessment and Plan:  KAMILIA CAROLLO is a 59 y.o. year old female with a history of depression,OCD,estrogen negativeT2 pN1, stage IIB invasive ductal carcinoma, diagnosed07/18/2019s/p mastectomy1/2020,neoadjuvantchemo immunotherapy/on trastzumab,asthma, OA, chronic back pain, who presents for follow up appointment  for below.   1. MDD (major depressive disorder), recurrent episode, mild ([place]) 2. Generalized anxiety disorder She continues to report depressive symptoms and anxiety since the last visit.  Psychosocial stressors includes history of breast cancer, most of her family members, unemployment, and taking care of her mother with medical condition.  Given she recently reinitiated Latuda, will continue current dose at this time as adjunctive treatment for depression.  Will consider up titration in the future if any worsening in her mood symptoms.  Discussed potential metabolic side effect and EPS.  We will continue venlafaxine to target depression and anxiety.  We will continue mirtazapine for depression.  Discussed potential metabolic side effect.  Will continue Xanax as needed for anxiety.  She is aware of the risk of dependence and oversedation.   3. Insomnia, unspecified type She reports benefit from Ambien for insomnia.  Will continue current dose to target insomnia.    Plan I have reviewed and updated plans as below 1. Continue venlafaxine 225 mg daily 2.Change Latuda 20 mg, take at night  3.Continue mirtazapine15 mg at night  4.Continue Xanax 1 mg three times a day (and additional 0.5 mg daily) as needed for anxiety   5. Continue Ambien 10 mg at night as needed for insomnia 6. Next appointment:9/28 at 11:50 for 30 mins, video   I have utilized the [place] Controlled Substances Reporting System (PMP  AWARxE) to confirm adherence regarding the patient's medication. My review reveals appropriate prescription fills.    Past trials of medication: sertraline, fluoxetine, lexapro,Buspar,venlafaxine, duloxetine, bupropion, quetiapine, Abilify  The patient demonstrates the following risk factors for suicide: Chronic risk factors for suicide include:psychiatric disorder ofdepressionand medical illness of breast cancer. Acute risk factorsfor suicide include: unemployment and loss (financial,  interpersonal, professional). Protective factorsfor this patient include: positive social support, responsibility to others (children, family), coping skills and hope for the future. Considering these factors, the overall suicide risk at this point appears to below. Patientisappropriate for outpatient follow up.  Norman Clay, MD 03/20/2020, 4:33 PM

## 2020-03-15 NOTE — Therapy (Signed)
[place], [place], [address] Phone: [phone]   Fax:  [phone]  Physical Therapy Treatment  Patient Details  Name: Jacqueline Good MRN: [id number] Date of Birth: 1960/12/19 Referring Provider (PT): Wilber Bihari NP   Encounter Date: 03/15/2020   PT End of Session - 03/15/20 1214    Visit Number 3    Number of Visits 4    Date for PT Re-Evaluation 03/21/20    Authorization Type Medicaid prepaid    PT Start Time 1109    PT Stop Time 1212    PT Time Calculation (min) 63 min    Activity Tolerance Patient tolerated treatment well    Behavior During Therapy [facility] for tasks assessed/performed           Past Medical History:  Diagnosis Date  . Anemia   . Anxiety   . Arthritis    "all over" (08/12/2018)  . Asthma   . Breast cancer, right breast ([place]) dx'd 02/2018  . Chronic bronchitis ([place])   . Depression   . Family history of breast cancer   . Family history of prostate cancer   . Family history of uterine cancer   . GERD (gastroesophageal reflux disease)   . History of blood transfusion    "several; related to low blood" (08/12/2018)  . History of radiation therapy 11/15/18- 12/17/18   Right Chest wall, SCV, PAB. 25 fractions.   . Personal history of chemotherapy   . Personal history of radiation therapy     Past Surgical History:  Procedure Laterality Date  . AXILLARY LYMPH NODE DISSECTION Right 08/12/2018   Procedure: AXILLARY LYMPH NODE DISSECTION;  Surgeon: Alphonsa Overall, MD;  Location: [place];  Service: General;  Laterality: Right;  . BREAST BIOPSY Right 03/2018  . BREAST RECONSTRUCTION WITH PLACEMENT OF TISSUE EXPANDER AND FLEX HD (ACELLULAR HYDRATED DERMIS) Right 08/12/2018   Procedure: RIGHT BREAST RECONSTRUCTION WITH PLACEMENT OF TISSUE EXPANDER AND FLEX HD (ACELLULAR HYDRATED DERMIS);  Surgeon: Wallace Going, DO;  Location: [place];  Service: Plastics;  Laterality: Right;  . BREAST  REDUCTION SURGERY Left 09/28/2019   Procedure: LEFT MAMMARY REDUCTION/MASTOPEXY  (BREAST);  Surgeon: Wallace Going, DO;  Location: [place];  Service: Plastics;  Laterality: Left;  . DILATION AND CURETTAGE OF UTERUS    . ENDOMETRIAL ABLATION    . IR IMAGING GUIDED PORT INSERTION  03/18/2018  . MASTECTOMY MODIFIED RADICAL Right 08/12/2018   Procedure: RIGHT MODIFIED RADICAL MASTECTOMY;  Surgeon: Alphonsa Overall, MD;  Location: [place];  Service: General;  Laterality: Right;  . MASTECTOMY MODIFIED RADICAL Right 08/12/2018   w/axillary LND  . MYOMECTOMY    . REDUCTION MAMMAPLASTY Left 2021  . REMOVAL OF TISSUE EXPANDER AND PLACEMENT OF IMPLANT Right 10/26/2018   Procedure: Removal of right breast expander;  Surgeon: Wallace Going, DO;  Location: [place];  Service: Plastics;  Laterality: Right;  75 min, please    There were no vitals filed for this visit.   Subjective Assessment - 03/15/20 1118    Subjective I'm still waiting for the Rt UE sleeve to arrive.    Pertinent History status post right modified radical mastectomy 08/12/2018 showing a complete pathologic response, 11 lymph nodes removed and radiation    Patient Stated Goals I want to get rid of the lymphedema.    Currently in Pain? No/denies  [facility] Adult PT Treatment/Exercise - 03/15/20 0001      Manual Therapy   Myofascial Release To Rt axilla during P/ROM and to Rt chest wall    Manual Lymphatic Drainage (MLD) In Supine: Short neck, 5 diaphragmatic breaths, Rt inguinal and Lt axillary nodes, then Rt axillo-inguinal (focusing on "pocket" of swelling inferior to axilla) and anterior inter-axillary anastomosis, then Rt UE working from lateral upper arm to dorsum of hand then retracing all steps.    Passive ROM In supine to Rt shoulder into flexion, abduction, D2 and er to pts tolerance                    PT Short Term Goals - 02/15/20 1623      PT SHORT  TERM GOAL #1   Title Pt will be independent with HEP and MLD within 2 weeks in order to demonstrate autonomy of care.    Baseline pt does not have HEP and has difficulty performing MLD    Time 2    Period Weeks    Status New    Target Date 03/07/20             PT Long Term Goals - 02/15/20 1623      PT LONG TERM GOAL #1   Title Pt will have 3 cm decrease in circumferential measurements from the mid antebrachium to distal brachium within 3 weeks in order to demonstrate improvement in lymphedema.    Baseline see measurements    Time 3    Period Weeks    Status New    Target Date 03/14/20      PT LONG TERM GOAL #2   Title Pt will report 50% improvement in pain and function of the RUE and breast in order to demonstrate a subjective improvement in quality of life.    Baseline 5/10 pain    Time 3    Period Weeks    Status New    Target Date 03/14/20      PT LONG TERM GOAL #3   Title Pt will be fitted for appropriate compression garments for home management of lymphedema.    Baseline currently pt has compression bras that are over 6 months old.    Time 3    Period Weeks    Status New    Target Date 03/14/20      PT LONG TERM GOAL #4   Title Pt will demonstrate 145 degrees R shoulder flexion and 120 degrees R shoulder abduction in order to improve functional ROM of the R shoulder.    Baseline R shoulder flexion 116, abduction 90 degrees.    Time 3    Period Weeks    Status New    Target Date 03/14/20                 Plan - 03/15/20 1416    Clinical Impression Statement Continued manual lymph drainage today of Rt UE, including MFR to Rt axilla and MFR. Pt has received her compression pump but Lt UE arrived instead of Rt UE so pt is awaiting arrival of correct sleeve. She was measured for night and day time garments for Rt UE and is awaiting arrival of these. She would like to return after garemnts arrive for final assess of these. She knows to call and request  extension of Medicaid dates if she's to return after8/11/21.    Personal Factors and Comorbidities Comorbidity 1    Comorbidities R modified radical masectomy with  radiation and 11 lymph node removal with ALND    PT Frequency 1x / week    PT Duration --   due to Medicaid   PT Treatment/Interventions Manual techniques;Therapeutic exercise;Therapeutic activities;Neuromuscular re-education    PT Next Visit Plan Medicaid renewal required if pt to cont after 03/21/20. Cont instructing MLD for the RUE and see if pt has questions, ROM/stretching and gradual strengthening for the RUE/scapulohumeral rhythm work progressing HEP for this.    Consulted and Agree with Plan of Care Patient           Patient will benefit from skilled therapeutic intervention in order to improve the following deficits and impairments:     Visit Diagnosis: Postmastectomy lymphedema  Stiffness of right shoulder, not elsewhere classified  Malignant neoplasm of upper-outer quadrant of right breast in female, estrogen receptor negative ([place])  Chronic right shoulder pain     Problem List Patient Active Problem List   Diagnosis Date Noted  . Breast pain, left 01/24/2020  . Postoperative breast asymmetry 09/13/2019  . Genetic testing 10/01/2018  . Family history of breast cancer   . Family history of uterine cancer   . Family history of prostate cancer   . S/P mastectomy, right 08/20/2018  . Acquired absence of right breast 08/20/2018  . Port-A-Cath in place 06/21/2018  . MDD (major depressive disorder), recurrent episode, moderate ([place]) 06/13/2018  . Generalized anxiety disorder 06/13/2018  . OCD (obsessive compulsive disorder) 06/13/2018  . Insomnia 06/13/2018  . Malignant neoplasm of upper-outer quadrant of right breast in female, estrogen receptor negative ([place]) 03/02/2018  . Needs flu shot 06/03/2017  . Chronic bilateral low back pain without sciatica 06/03/2017  . Adjustment disorder with anxious mood  03/27/2016  . Benign paroxysmal positional vertigo 10/31/2014  . Arthritis 04/27/2014  . Fatigue 06/10/2013  . Morbid obesity ([place]) 06/10/2013  . Asthma, chronic obstructive, with acute exacerbation ([place]) 06/10/2013  . Allergy 06/10/2013    Otelia Limes, PTA 03/15/2020, 2:33 PM  [place], [place], [address] Phone: [phone]   Fax:  [phone]  Name: Jacqueline Good MRN: [id number] Date of Birth: January 24, 1961

## 2020-03-20 ENCOUNTER — Telehealth (INDEPENDENT_AMBULATORY_CARE_PROVIDER_SITE_OTHER): Payer: Medicaid Other | Admitting: Psychiatry

## 2020-03-20 ENCOUNTER — Other Ambulatory Visit: Payer: Self-pay

## 2020-03-20 ENCOUNTER — Encounter (HOSPITAL_COMMUNITY): Payer: Self-pay | Admitting: Psychiatry

## 2020-03-20 ENCOUNTER — Other Ambulatory Visit (HOSPITAL_COMMUNITY): Payer: Self-pay | Admitting: Psychiatry

## 2020-03-20 DIAGNOSIS — F330 Major depressive disorder, recurrent, mild: Secondary | ICD-10-CM

## 2020-03-20 DIAGNOSIS — G4700 Insomnia, unspecified: Secondary | ICD-10-CM | POA: Diagnosis not present

## 2020-03-20 DIAGNOSIS — F411 Generalized anxiety disorder: Secondary | ICD-10-CM | POA: Diagnosis not present

## 2020-03-20 MED ORDER — ZOLPIDEM TARTRATE 10 MG PO TABS: 10.0000 mg | ORAL_TABLET | Freq: Every evening | ORAL | 1 refills | Status: DC | PRN

## 2020-03-20 MED ORDER — ALPRAZOLAM 1 MG PO TABS: ORAL_TABLET | ORAL | 1 refills | Status: DC

## 2020-03-20 MED ORDER — MIRTAZAPINE 15 MG PO TABS: 15.0000 mg | ORAL_TABLET | Freq: Every day | ORAL | 0 refills | Status: DC

## 2020-03-20 MED ORDER — VENLAFAXINE HCL 75 MG PO TABS: 225.0000 mg | ORAL_TABLET | Freq: Every day | ORAL | 0 refills | Status: DC

## 2020-03-20 MED FILL — MIRTAZAPINE 15 MG TABS: 15 | 90 days supply | Qty: 90 | Fill #0

## 2020-03-20 MED FILL — VENLAFAXINE HCL 75 MG TAB: 75 | 30 days supply | Qty: 90 | Fill #0

## 2020-03-20 NOTE — Patient Instructions (Signed)
1. Continue venlafaxine 225 mg daily 2.Continue Latuda 20 mg, take at night  3.Continue mirtazapine15 mg at night  4.Continue Xanax 1 mg three times a day (and additional 0.5 mg daily) as needed for anxiety  5. Continue Ambien 10 mg at night as needed for insomnia 6. Next appointment:9/28 at 11:50

## 2020-03-23 MED FILL — ZOLPIDEM TARTRATE 10 MG TAB: 10 | 30 days supply | Qty: 15 | Fill #0

## 2020-03-23 MED FILL — tiZANidine HCL 4 MG TABS: 4 | 30 days supply | Qty: 60 | Fill #2

## 2020-03-23 MED FILL — ALPRAZOLAM 1 MG TABS: 1 | 30 days supply | Qty: 105 | Fill #1

## 2020-04-17 ENCOUNTER — Other Ambulatory Visit: Payer: Self-pay | Admitting: Adult Health

## 2020-04-17 MED FILL — VENLAFAXINE HCL 75 MG TAB: 75 | 30 days supply | Qty: 90 | Fill #1

## 2020-04-20 MED FILL — ALPRAZOLAM 1 MG TABS: 1 | 30 days supply | Qty: 105 | Fill #0

## 2020-04-20 MED FILL — ZOLPIDEM TARTRATE 10 MG TAB: 10 | 30 days supply | Qty: 15 | Fill #1

## 2020-04-20 MED FILL — ZOLPIDEM TARTRATE 10 MG TAB: 10 | 30 days supply | Qty: 15 | Fill #2

## 2020-04-20 MED FILL — LATUDA 20 MG TABLET: 20 | 30 days supply | Qty: 30 | Fill #1

## 2020-04-23 ENCOUNTER — Other Ambulatory Visit: Payer: Self-pay | Admitting: Adult Health

## 2020-04-23 MED FILL — ESOMEPRAZOLE MAG DR 40 MG C: 40 | 30 days supply | Qty: 30 | Fill #0

## 2020-04-25 NOTE — Progress Notes (Deleted)
[place] MD/PA/NP OP Progress Note  04/25/2020 10:26 AM Jacqueline Good  MRN:  [id number]  Chief Complaint:  HPI: *** Visit Diagnosis: No diagnosis found.  Past Psychiatric History: Please see initial evaluation for full details. I have reviewed the history. No updates at this time.     Past Medical History:  Past Medical History:  Diagnosis Date  . Anemia   . Anxiety   . Arthritis    "all over" (08/12/2018)  . Asthma   . Breast cancer, right breast ([place]) dx'd 02/2018  . Chronic bronchitis ([place])   . Depression   . Family history of breast cancer   . Family history of prostate cancer   . Family history of uterine cancer   . GERD (gastroesophageal reflux disease)   . History of blood transfusion    "several; related to low blood" (08/12/2018)  . History of radiation therapy 11/15/18- 12/17/18   Right Chest wall, SCV, PAB. 25 fractions.   . Personal history of chemotherapy   . Personal history of radiation therapy     Past Surgical History:  Procedure Laterality Date  . AXILLARY LYMPH NODE DISSECTION Right 08/12/2018   Procedure: AXILLARY LYMPH NODE DISSECTION;  Surgeon: Alphonsa Overall, MD;  Location: [facility];  Service: General;  Laterality: Right;  . BREAST BIOPSY Right 03/2018  . BREAST RECONSTRUCTION WITH PLACEMENT OF TISSUE EXPANDER AND FLEX HD (ACELLULAR HYDRATED DERMIS) Right 08/12/2018   Procedure: RIGHT BREAST RECONSTRUCTION WITH PLACEMENT OF TISSUE EXPANDER AND FLEX HD (ACELLULAR HYDRATED DERMIS);  Surgeon: Wallace Going, DO;  Location: [facility];  Service: Plastics;  Laterality: Right;  . BREAST REDUCTION SURGERY Left 09/28/2019   Procedure: LEFT MAMMARY REDUCTION/MASTOPEXY  (BREAST);  Surgeon: Wallace Going, DO;  Location: [facility];  Service: Plastics;  Laterality: Left;  . DILATION AND CURETTAGE OF UTERUS    . ENDOMETRIAL ABLATION    . IR IMAGING GUIDED PORT INSERTION  03/18/2018  . MASTECTOMY MODIFIED RADICAL Right 08/12/2018   Procedure: RIGHT MODIFIED  RADICAL MASTECTOMY;  Surgeon: Alphonsa Overall, MD;  Location: [facility];  Service: General;  Laterality: Right;  . MASTECTOMY MODIFIED RADICAL Right 08/12/2018   w/axillary LND  . MYOMECTOMY    . REDUCTION MAMMAPLASTY Left 2021  . REMOVAL OF TISSUE EXPANDER AND PLACEMENT OF IMPLANT Right 10/26/2018   Procedure: Removal of right breast expander;  Surgeon: Wallace Going, DO;  Location: [facility];  Service: Plastics;  Laterality: Right;  75 min, please    Family Psychiatric History: Please see initial evaluation for full details. I have reviewed the history. No updates at this time.     Family History:  Family History  Problem Relation Age of Onset  . Lupus Sister   . Heart disease Sister   . Breast cancer Mother 6  . Prostate cancer Paternal Uncle   . Diabetes Maternal Grandmother   . Heart Problems Maternal Grandmother   . Prostate cancer Maternal Grandfather   . Prostate cancer Paternal Grandfather   . Prostate cancer Other        MGFs brother  . Breast cancer Other        Mat great-grandmother's sister  . Uterine cancer Maternal Great-grandmother   . Prostate cancer Other        PGFs brother    Social History:  Social History   Socioeconomic History  . Marital status: Single    Spouse name: Not on file  . Number of children: Not on file  .  Years of education: Not on file  . Highest education level: Not on file  Occupational History  . Not on file  Tobacco Use  . Smoking status: Former Smoker    Packs/day: 1.00    Years: 38.00    Pack years: 38.00    Types: Cigarettes    Quit date: 05/15/2018    Years since quitting: 1.9  . Smokeless tobacco: Never Used  Vaping Use  . Vaping Use: Never used  Substance and Sexual Activity  . Alcohol use: Yes    Comment: occ  . Drug use: No  . Sexual activity: Not Currently    Birth control/protection: None  Other Topics Concern  . Not on file  Social History Narrative  . Not on file   Social Determinants of Health    Financial Resource Strain:   . Difficulty of Paying Living Expenses: Not on file  Food Insecurity:   . Worried About Charity fundraiser in the Last Year: Not on file  . Ran Out of Food in the Last Year: Not on file  Transportation Needs:   . Lack of Transportation (Medical): Not on file  . Lack of Transportation (Non-Medical): Not on file  Physical Activity:   . Days of Exercise per Week: Not on file  . Minutes of Exercise per Session: Not on file  Stress:   . Feeling of Stress : Not on file  Social Connections:   . Frequency of Communication with Friends and Family: Not on file  . Frequency of Social Gatherings with Friends and Family: Not on file  . Attends Religious Services: Not on file  . Active Member of Clubs or Organizations: Not on file  . Attends Archivist Meetings: Not on file  . Marital Status: Not on file    Allergies:  Allergies  Allergen Reactions  . Gadolinium Derivatives Itching and Cough    Pt began sneezing and coughing as soon as MRI contrast was injected. Severe nasal congestion. No rash or hives no SOB.   Marland Kitchen Hydrocodone Hives and Itching    Pt says she can take it with benadryl.   . Iodinated Diagnostic Agents Itching    Metabolic Disorder Labs: Lab Results  Component Value Date   HGBA1C 5.4 04/27/2014   No results found for: PROLACTIN Lab Results  Component Value Date   CHOL 201 (H) 02/18/2019   TRIG 115 02/18/2019   HDL 63 02/18/2019   CHOLHDL 3.2 02/18/2019   VLDL 23 02/18/2019   LDLCALC 115 (H) 02/18/2019   LDLCALC 76 09/24/2016   Lab Results  Component Value Date   TSH 0.58 09/24/2016   TSH 0.696 04/27/2014    Therapeutic Level Labs: No results found for: LITHIUM No results found for: VALPROATE No components found for:  CBMZ  Current Medications: Current Outpatient Medications  Medication Sig Dispense Refill  . ALPRAZolam (XANAX) 1 MG tablet 1 mg three times and 0.5 mg per day as needed for anxiety. No more than  3.5 mg per day 105 tablet 1  . esomeprazole (NEXIUM) 40 MG capsule TAKE 1 CAPSULE BY MOUTH ONCE DAILY 30 capsule 2  . fluticasone (FLONASE) 50 MCG/ACT nasal spray Place 2 sprays into both nostrils daily as needed for up to 14 days for allergies. 1 g 1  . Fluticasone-Salmeterol (ADVAIR DISKUS) 250-50 MCG/DOSE AEPB INHALE 1 PUFF INTO THE LUNGS 2 TIMES DAILY. 60 each 2  . loratadine (CLARITIN) 10 MG tablet Take 1 tablet (10 mg total) by  mouth daily as needed for allergies. 30 tablet 11  . lurasidone (LATUDA) 20 MG TABS tablet Take 1 tablet (20 mg total) by mouth daily. 30 tablet 1  . mirtazapine (REMERON) 15 MG tablet Take 1 tablet (15 mg total) by mouth at bedtime. 90 tablet 0  . naproxen (NAPROSYN) 500 MG tablet Take 1 tablet (500 mg total) by mouth 2 (two) times daily with a meal. 60 tablet 2  . omeprazole (PRILOSEC) 40 MG capsule TAKE 1 CAPSULE BY MOUTH DAILY. 30 capsule 2  . propranolol (INDERAL) 40 MG tablet Take 40 mg by mouth daily as needed (heart palpitation.).     Marland Kitchen tiZANidine (ZANAFLEX) 4 MG tablet     . venlafaxine (EFFEXOR) 75 MG tablet Take 3 tablets (225 mg total) by mouth daily. 270 tablet 0  . zolpidem (AMBIEN) 10 MG tablet Take 1 tablet (10 mg total) by mouth at bedtime as needed for sleep. 30 tablet 1   No current facility-administered medications for this visit.     Musculoskeletal: Strength & Muscle Tone: N/A Gait & Station: N/A Patient leans: N/A  Psychiatric Specialty Exam: Review of Systems  There were no vitals taken for this visit.There is no height or weight on file to calculate BMI.  General Appearance: {Appearance:22683}  Eye Contact:  {BHH EYE CONTACT:22684}  Speech:  Clear and Coherent  Volume:  Normal  Mood:  {BHH MOOD:22306}  Affect:  {Affect (PAA):22687}  Thought Process:  Coherent  Orientation:  Full (Time, Place, and Person)  Thought Content: Logical   Suicidal Thoughts:  {ST/HT (PAA):22692}  Homicidal Thoughts:  {ST/HT (PAA):22692}  Memory:   Immediate;   Good  Judgement:  {Judgement (PAA):22694}  Insight:  {Insight (PAA):22695}  Psychomotor Activity:  Normal  Concentration:  Concentration: Good and Attention Span: Good  Recall:  Good  Fund of Knowledge: Good  Language: Good  Akathisia:  No  Handed:  Right  AIMS (if indicated): not done  Assets:  Communication Skills Desire for Improvement  ADL's:  Intact  Cognition: WNL  Sleep:  {BHH GOOD/FAIR/POOR:22877}   Screenings: GAD-7     Office Visit from 04/22/2018 in [facility] Office Visit from 06/03/2017 in [facility] Office Visit from 09/24/2016 in [facility] Office Visit from 03/27/2016 in [facility]  Total GAD-7 Score 11 3 15 16     PHQ2-9     Office Visit from 04/22/2018 in [facility] Office Visit from 06/03/2017 in [facility] Office Visit from 09/24/2016 in [facility] Office Visit from 05/01/2016 in [facility] Office Visit from 03/27/2016 in [facility]  PHQ-2 Total Score 5 0 2 5 2   PHQ-9 Total Score 15 4 7 8 10        Assessment and Plan:  Jacqueline Good is a 59 y.o. year old female with a history of depression,OCD,estrogen negativeT2 pN1, stage IIB invasive ductal carcinoma, diagnosed07/18/2019s/p mastectomy1/2020,neoadjuvantchemo immunotherapy/on trastzumab,asthma, OA, chronic back pain, who presents for follow up appointment for below.    1. MDD (major depressive disorder), recurrent episode, mild ([place]) 2. Generalized anxiety disorder She continues to report depressive symptoms and anxiety since the last visit.  Psychosocial stressors includes history of breast cancer, most of her family members, unemployment, and taking care of her mother with medical condition.  Given she recently  reinitiated  Latuda, will continue current dose at this time as adjunctive treatment for depression.  Will consider up titration in the future if any worsening in her mood symptoms.  Discussed potential metabolic side effect and EPS.  We will continue venlafaxine to target depression and anxiety.  We will continue mirtazapine for depression.  Discussed potential metabolic side effect.  Will continue Xanax as needed for anxiety.  She is aware of the risk of dependence and oversedation.   3. Insomnia, unspecified type She reports benefit from Ambien for insomnia.  Will continue current dose to target insomnia.    Plan  1. Continue venlafaxine 225 mg daily 2.Change Latuda20mg , take at night 3.Continue mirtazapine15 mg at night  4.Continue Xanax 1 mg three times a day (and additional 0.5 mg daily) as needed for anxiety  5. Continue Ambien 10 mg at night as needed for insomnia 6. Next appointment:9/28 at 11:50 for 30 mins, video   I have utilized the [place] Controlled Substances Reporting System (PMP AWARxE) to confirm adherence regarding the patient's medication. My review reveals appropriate prescription fills.    Past trials of medication: sertraline, fluoxetine, lexapro,Buspar,venlafaxine, duloxetine, bupropion, quetiapine, Abilify  The patient demonstrates the following risk factors for suicide: Chronic risk factors for suicide include:psychiatric disorder ofdepressionand medical illness of breast cancer. Acute risk factorsfor suicide include: unemployment and loss (financial, interpersonal, professional). Protective factorsfor this patient include: positive social support, responsibility to others (children, family), coping skills and hope for the future. Considering these factors, the overall suicide risk at this point appears to below. Patientisappropriate for outpatient follow up.   Norman Clay, MD 04/25/2020, 10:26 AM

## 2020-05-07 NOTE — Progress Notes (Deleted)
[facility] MD/PA/NP OP Progress Note  05/07/2020 2:59 PM Jacqueline Good  MRN:  [id number]  Chief Complaint:  HPI: *** Visit Diagnosis: No diagnosis found.  Past Psychiatric History: Please see initial evaluation for full details. I have reviewed the history. No updates at this time.     Past Medical History:  Past Medical History:  Diagnosis Date  . Anemia   . Anxiety   . Arthritis    "all over" (08/12/2018)  . Asthma   . Breast cancer, right breast ([place]) dx'd 02/2018  . Chronic bronchitis ([place])   . Depression   . Family history of breast cancer   . Family history of prostate cancer   . Family history of uterine cancer   . GERD (gastroesophageal reflux disease)   . History of blood transfusion    "several; related to low blood" (08/12/2018)  . History of radiation therapy 11/15/18- 12/17/18   Right Chest wall, SCV, PAB. 25 fractions.   . Personal history of chemotherapy   . Personal history of radiation therapy     Past Surgical History:  Procedure Laterality Date  . AXILLARY LYMPH NODE DISSECTION Right 08/12/2018   Procedure: AXILLARY LYMPH NODE DISSECTION;  Surgeon: Alphonsa Overall, MD;  Location: [facility];  Service: General;  Laterality: Right;  . BREAST BIOPSY Right 03/2018  . BREAST RECONSTRUCTION WITH PLACEMENT OF TISSUE EXPANDER AND FLEX HD (ACELLULAR HYDRATED DERMIS) Right 08/12/2018   Procedure: RIGHT BREAST RECONSTRUCTION WITH PLACEMENT OF TISSUE EXPANDER AND FLEX HD (ACELLULAR HYDRATED DERMIS);  Surgeon: Wallace Going, DO;  Location: [facility];  Service: Plastics;  Laterality: Right;  . BREAST REDUCTION SURGERY Left 09/28/2019   Procedure: LEFT MAMMARY REDUCTION/MASTOPEXY  (BREAST);  Surgeon: Wallace Going, DO;  Location: [facility];  Service: Plastics;  Laterality: Left;  . DILATION AND CURETTAGE OF UTERUS    . ENDOMETRIAL ABLATION    . IR IMAGING GUIDED PORT INSERTION  03/18/2018  . MASTECTOMY MODIFIED RADICAL Right 08/12/2018   Procedure: RIGHT MODIFIED RADICAL  MASTECTOMY;  Surgeon: Alphonsa Overall, MD;  Location: [facility];  Service: General;  Laterality: Right;  . MASTECTOMY MODIFIED RADICAL Right 08/12/2018   w/axillary LND  . MYOMECTOMY    . REDUCTION MAMMAPLASTY Left 2021  . REMOVAL OF TISSUE EXPANDER AND PLACEMENT OF IMPLANT Right 10/26/2018   Procedure: Removal of right breast expander;  Surgeon: Wallace Going, DO;  Location: [facility];  Service: Plastics;  Laterality: Right;  75 min, please    Family Psychiatric History: Please see initial evaluation for full details. I have reviewed the history. No updates at this time.     Family History:  Family History  Problem Relation Age of Onset  . Lupus Sister   . Heart disease Sister   . Breast cancer Mother 27  . Prostate cancer Paternal Uncle   . Diabetes Maternal Grandmother   . Heart Problems Maternal Grandmother   . Prostate cancer Maternal Grandfather   . Prostate cancer Paternal Grandfather   . Prostate cancer Other        MGFs brother  . Breast cancer Other        Mat great-grandmother's sister  . Uterine cancer Maternal Great-grandmother   . Prostate cancer Other        PGFs brother    Social History:  Social History   Socioeconomic History  . Marital status: Single    Spouse name: Not on file  . Number of children: Not on file  .  Years of education: Not on file  . Highest education level: Not on file  Occupational History  . Not on file  Tobacco Use  . Smoking status: Former Smoker    Packs/day: 1.00    Years: 38.00    Pack years: 38.00    Types: Cigarettes    Quit date: 05/15/2018    Years since quitting: 1.9  . Smokeless tobacco: Never Used  Vaping Use  . Vaping Use: Never used  Substance and Sexual Activity  . Alcohol use: Yes    Comment: occ  . Drug use: No  . Sexual activity: Not Currently    Birth control/protection: None  Other Topics Concern  . Not on file  Social History Narrative  . Not on file   Social Determinants of Health   Financial  Resource Strain:   . Difficulty of Paying Living Expenses: Not on file  Food Insecurity:   . Worried About Charity fundraiser in the Last Year: Not on file  . Ran Out of Food in the Last Year: Not on file  Transportation Needs:   . Lack of Transportation (Medical): Not on file  . Lack of Transportation (Non-Medical): Not on file  Physical Activity:   . Days of Exercise per Week: Not on file  . Minutes of Exercise per Session: Not on file  Stress:   . Feeling of Stress : Not on file  Social Connections:   . Frequency of Communication with Friends and Family: Not on file  . Frequency of Social Gatherings with Friends and Family: Not on file  . Attends Religious Services: Not on file  . Active Member of Clubs or Organizations: Not on file  . Attends Archivist Meetings: Not on file  . Marital Status: Not on file    Allergies:  Allergies  Allergen Reactions  . Gadolinium Derivatives Itching and Cough    Pt began sneezing and coughing as soon as MRI contrast was injected. Severe nasal congestion. No rash or hives no SOB.   Marland Kitchen Hydrocodone Hives and Itching    Pt says she can take it with benadryl.   . Iodinated Diagnostic Agents Itching    Metabolic Disorder Labs: Lab Results  Component Value Date   HGBA1C 5.4 04/27/2014   No results found for: PROLACTIN Lab Results  Component Value Date   CHOL 201 (H) 02/18/2019   TRIG 115 02/18/2019   HDL 63 02/18/2019   CHOLHDL 3.2 02/18/2019   VLDL 23 02/18/2019   LDLCALC 115 (H) 02/18/2019   LDLCALC 76 09/24/2016   Lab Results  Component Value Date   TSH 0.58 09/24/2016   TSH 0.696 04/27/2014    Therapeutic Level Labs: No results found for: LITHIUM No results found for: VALPROATE No components found for:  CBMZ  Current Medications: Current Outpatient Medications  Medication Sig Dispense Refill  . ALPRAZolam (XANAX) 1 MG tablet 1 mg three times and 0.5 mg per day as needed for anxiety. No more than 3.5 mg per day  105 tablet 1  . esomeprazole (NEXIUM) 40 MG capsule TAKE 1 CAPSULE BY MOUTH ONCE DAILY 30 capsule 2  . fluticasone (FLONASE) 50 MCG/ACT nasal spray Place 2 sprays into both nostrils daily as needed for up to 14 days for allergies. 1 g 1  . Fluticasone-Salmeterol (ADVAIR DISKUS) 250-50 MCG/DOSE AEPB INHALE 1 PUFF INTO THE LUNGS 2 TIMES DAILY. 60 each 2  . loratadine (CLARITIN) 10 MG tablet Take 1 tablet (10 mg total) by  mouth daily as needed for allergies. 30 tablet 11  . lurasidone (LATUDA) 20 MG TABS tablet Take 1 tablet (20 mg total) by mouth daily. 30 tablet 1  . mirtazapine (REMERON) 15 MG tablet Take 1 tablet (15 mg total) by mouth at bedtime. 90 tablet 0  . naproxen (NAPROSYN) 500 MG tablet Take 1 tablet (500 mg total) by mouth 2 (two) times daily with a meal. 60 tablet 2  . omeprazole (PRILOSEC) 40 MG capsule TAKE 1 CAPSULE BY MOUTH DAILY. 30 capsule 2  . propranolol (INDERAL) 40 MG tablet Take 40 mg by mouth daily as needed (heart palpitation.).     Marland Kitchen tiZANidine (ZANAFLEX) 4 MG tablet     . venlafaxine (EFFEXOR) 75 MG tablet Take 3 tablets (225 mg total) by mouth daily. 270 tablet 0  . zolpidem (AMBIEN) 10 MG tablet Take 1 tablet (10 mg total) by mouth at bedtime as needed for sleep. 30 tablet 1   No current facility-administered medications for this visit.     Musculoskeletal: Strength & Muscle Tone: N/A Gait & Station: N?A Patient leans: N/A  Psychiatric Specialty Exam: Review of Systems  There were no vitals taken for this visit.There is no height or weight on file to calculate BMI.  General Appearance: {Appearance:22683}  Eye Contact:  {BHH EYE CONTACT:22684}  Speech:  Clear and Coherent  Volume:  Normal  Mood:  {BHH MOOD:22306}  Affect:  {Affect (PAA):22687}  Thought Process:  Coherent  Orientation:  Full (Time, Place, and Person)  Thought Content: Logical   Suicidal Thoughts:  {ST/HT (PAA):22692}  Homicidal Thoughts:  {ST/HT (PAA):22692}  Memory:  Immediate;   Good   Judgement:  {Judgement (PAA):22694}  Insight:  {Insight (PAA):22695}  Psychomotor Activity:  Normal  Concentration:  Concentration: Good and Attention Span: Good  Recall:  Good  Fund of Knowledge: Good  Language: Good  Akathisia:  No  Handed:  Right  AIMS (if indicated): not done  Assets:  Communication Skills Desire for Improvement  ADL's:  Intact  Cognition: WNL  Sleep:  {BHH GOOD/FAIR/POOR:22877}   Screenings: GAD-7     Office Visit from 04/22/2018 in [facility] Office Visit from 06/03/2017 in [facility] Office Visit from 09/24/2016 in [facility] Office Visit from 03/27/2016 in [facility]  Total GAD-7 Score 11 3 15 16     PHQ2-9     Office Visit from 04/22/2018 in [facility] Office Visit from 06/03/2017 in [facility] Office Visit from 09/24/2016 in [facility] Office Visit from 05/01/2016 in [facility] Office Visit from 03/27/2016 in [facility]  PHQ-2 Total Score 5 0 2 5 2   PHQ-9 Total Score 15 4 7 8 10        Assessment and Plan:  AIREL MAGADAN is a 59 y.o. year old female with a history of  depression,OCD,estrogen negativeT2 pN1, stage IIB invasive ductal carcinoma, diagnosed07/18/2019s/p mastectomy1/2020,neoadjuvantchemo immunotherapy/on trastzumab,asthma, OA, chronic back pain, who presents for follow up appointment for below.   1. MDD (major depressive disorder), recurrent episode, mild ([place]) 2. Generalized anxiety disorder She continues to report depressive symptoms and anxiety since the last visit.  Psychosocial stressors includes history of breast cancer, most of her family members, unemployment, and taking care of her mother with medical condition.  Given she recently reinitiated [name],  will  continue current dose at this time as adjunctive treatment for depression.  Will consider up titration in the future if any worsening in her mood symptoms.  Discussed potential metabolic side effect and EPS.  We will continue venlafaxine to target depression and anxiety.  We will continue mirtazapine for depression.  Discussed potential metabolic side effect.  Will continue Xanax as needed for anxiety.  She is aware of the risk of dependence and oversedation.   3. Insomnia, unspecified type She reports benefit from Ambien for insomnia.  Will continue current dose to target insomnia.    Plan  1. Continue venlafaxine 225 mg daily 2.Change Latuda20mg , take at night 3.Continue mirtazapine15 mg at night  4.Continue Xanax 1 mg three times a day (and additional 0.5 mg daily) as needed for anxiety  5. Continue Ambien 10 mg at night as needed for insomnia 6. Next appointment:9/28 at 11:50 for 30 mins, video   Past trials of medication: sertraline, fluoxetine, lexapro,Buspar,venlafaxine, duloxetine, bupropion, quetiapine, Abilify  The patient demonstrates the following risk factors for suicide: Chronic risk factors for suicide include:psychiatric disorder ofdepressionand medical illness of breast cancer. Acute risk factorsfor suicide include: unemployment and loss (financial, interpersonal, professional). Protective factorsfor this patient include: positive social support, responsibility to others (children, family), coping skills and hope for the future. Considering these factors, the overall suicide risk at this point appears to below. Patientisappropriate for outpatient follow up.   Norman Clay, MD 05/07/2020, 2:59 PM

## 2020-05-08 ENCOUNTER — Telehealth (HOSPITAL_COMMUNITY): Payer: Medicaid Other | Admitting: Psychiatry

## 2020-05-09 ENCOUNTER — Telehealth (HOSPITAL_COMMUNITY): Payer: Medicaid Other | Admitting: Psychiatry

## 2020-05-09 ENCOUNTER — Other Ambulatory Visit: Payer: Self-pay

## 2020-05-10 ENCOUNTER — Telehealth (HOSPITAL_COMMUNITY): Payer: Medicaid Other | Admitting: Psychiatry

## 2020-05-17 MED FILL — ALPRAZOLAM 1 MG TABS: 1 | 30 days supply | Qty: 105 | Fill #1

## 2020-05-17 MED FILL — ZOLPIDEM TARTRATE 10 MG TAB: 10 | 30 days supply | Qty: 15 | Fill #3

## 2020-05-17 MED FILL — VENLAFAXINE HCL 75 MG TAB: 75 | 30 days supply | Qty: 90 | Fill #2

## 2020-05-17 MED FILL — NAPROXEN 500 MG TABS: 500 | 30 days supply | Qty: 60 | Fill #2

## 2020-05-17 MED FILL — ESOMEPRAZOLE MAG DR 40 MG C: 40 | 30 days supply | Qty: 30 | Fill #0

## 2020-05-29 ENCOUNTER — Other Ambulatory Visit: Payer: Self-pay | Admitting: Oncology

## 2020-05-29 DIAGNOSIS — Z853 Personal history of malignant neoplasm of breast: Secondary | ICD-10-CM
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
## 2020-05-31 ENCOUNTER — Telehealth: Payer: Self-pay

## 2020-05-31 NOTE — Telephone Encounter (Signed)
Pt called to ask if we offer covid booster; made pt aware we do. Pt also voices concerns about (R) sided pain in ribs ongoing for 1-2 mos. Pt states it feels like a cramp, and hurts to move a certain way. Dr Jana Hakim made aware and awaiting response. Pt understands I will call her back with Dr [name]'s response.

## 2020-06-01 NOTE — Telephone Encounter (Signed)
Attempted to call pt back to advise contacting her surgeon, or making an appt with symptom management per Dr Jana Hakim, as he suspects this is a result of her surgery. LVM for pt to return call.

## 2020-06-04 ENCOUNTER — Telehealth (INDEPENDENT_AMBULATORY_CARE_PROVIDER_SITE_OTHER): Payer: Medicaid Other | Admitting: Psychiatry

## 2020-06-04 ENCOUNTER — Encounter (HOSPITAL_COMMUNITY): Payer: Self-pay | Admitting: Psychiatry

## 2020-06-04 ENCOUNTER — Other Ambulatory Visit: Payer: Self-pay

## 2020-06-04 ENCOUNTER — Other Ambulatory Visit (HOSPITAL_COMMUNITY): Payer: Self-pay | Admitting: Psychiatry

## 2020-06-04 DIAGNOSIS — G4700 Insomnia, unspecified: Secondary | ICD-10-CM | POA: Diagnosis not present

## 2020-06-04 DIAGNOSIS — F411 Generalized anxiety disorder: Secondary | ICD-10-CM

## 2020-06-04 DIAGNOSIS — F330 Major depressive disorder, recurrent, mild: Secondary | ICD-10-CM | POA: Diagnosis not present

## 2020-06-04 MED ORDER — ALPRAZOLAM 1 MG PO TABS: ORAL_TABLET | ORAL | 1 refills | Status: DC

## 2020-06-04 MED ORDER — ZOLPIDEM TARTRATE 10 MG PO TABS: 10.0000 mg | ORAL_TABLET | Freq: Every evening | ORAL | 1 refills | Status: DC | PRN

## 2020-06-04 MED ORDER — MIRTAZAPINE 15 MG PO TABS: 15.0000 mg | ORAL_TABLET | Freq: Every day | ORAL | 0 refills | Status: DC

## 2020-06-04 MED ORDER — VENLAFAXINE HCL 75 MG PO TABS: 225.0000 mg | ORAL_TABLET | Freq: Every day | ORAL | 0 refills | Status: DC

## 2020-06-04 MED ORDER — LURASIDONE HCL 20 MG PO TABS: 20.0000 mg | ORAL_TABLET | Freq: Every day | ORAL | 0 refills | Status: DC

## 2020-06-04 MED FILL — LATUDA 20 MG TABLET: 20 | 30 days supply | Qty: 30 | Fill #0

## 2020-06-04 MED FILL — MIRTAZAPINE 15 MG TABS: 15 | 90 days supply | Qty: 90 | Fill #0

## 2020-06-04 NOTE — Patient Instructions (Signed)
1. Continue venlafaxine 225 mg daily 2.Change Latuda20mg , take at night 3.Continue mirtazapine15 mg at night  4.Continue Xanax 1 mg three times a day (and additional 0.5 mg daily) as needed for anxiety  5. Continue Ambien 10 mg at night as needed for insomnia 6. Next appointment:12/20 at 11:30

## 2020-06-08 ENCOUNTER — Telehealth: Payer: Self-pay

## 2020-06-08 NOTE — Telephone Encounter (Signed)
Pt returned call from my attempt to call her on 10/22. This LPN advised pt to see breast surgeon regarding the pain she is feeling, per Dr Jana Hakim. Pt verbalized thanks and understanding, and also understands to call with any further concerns.

## 2020-06-13 ENCOUNTER — Other Ambulatory Visit: Payer: Self-pay

## 2020-06-13 ENCOUNTER — Ambulatory Visit
Admission: RE | Admit: 2020-06-13 | Discharge: 2020-06-13 | Disposition: A | Discharge status: Home / Self Care | Payer: Medicaid Other | Source: Ambulatory Visit | Attending: Oncology | Admitting: Oncology

## 2020-06-13 ENCOUNTER — Ambulatory Visit
Admission: RE | Admit: 2020-06-13 | Discharge: 2020-06-13 | Disposition: A | Discharge status: Home / Self Care | Payer: Medicaid Other | Source: Ambulatory Visit | Attending: Plastic Surgery | Admitting: Plastic Surgery

## 2020-06-13 DIAGNOSIS — N644 Mastodynia: Secondary | ICD-10-CM

## 2020-06-13 DIAGNOSIS — Z9011 Acquired absence of right breast and nipple: Secondary | ICD-10-CM

## 2020-06-13 DIAGNOSIS — N6489 Other specified disorders of breast: Secondary | ICD-10-CM

## 2020-06-13 DIAGNOSIS — Z853 Personal history of malignant neoplasm of breast: Secondary | ICD-10-CM

## 2020-06-13 MED FILL — ALPRAZOLAM 1 MG TABS: 1 | 30 days supply | Qty: 105 | Fill #0

## 2020-06-13 MED FILL — LATUDA 20 MG TABLET: 20 | 30 days supply | Qty: 30 | Fill #0

## 2020-06-13 MED FILL — ZOLPIDEM TARTRATE 10 MG TAB: 10 | 15 days supply | Qty: 15 | Fill #1

## 2020-06-13 MED FILL — VENLAFAXINE HCL 75 MG TAB: 75 | 30 days supply | Qty: 90 | Fill #0

## 2020-06-13 MED FILL — ZOLPIDEM TARTRATE 10 MG TAB: 10 | 15 days supply | Qty: 15 | Fill #0

## 2020-06-13 MED FILL — ESOMEPRAZOLE MAG DR 40 MG C: 40 | 30 days supply | Qty: 30 | Fill #1

## 2020-06-14 ENCOUNTER — Other Ambulatory Visit: Payer: Self-pay | Admitting: Oncology

## 2020-06-14 DIAGNOSIS — Z171 Estrogen receptor negative status [ER-]: Secondary | ICD-10-CM

## 2020-06-14 DIAGNOSIS — C50411 Malignant neoplasm of upper-outer quadrant of right female breast: Secondary | ICD-10-CM

## 2020-06-18 IMAGING — MR MR BILATERAL BREAST WITHOUT AND WITH CONTRAST
4 of 12 series · 18 of 48 positions shown · IV contrast (Yes)
Comparison: 03/31/2018 and earlier

CLINICAL DATA: The patient had an ultrasound-guided core biopsy of
mass in the 10 o'clock location of the RIGHT breast showing invasive
ductal carcinoma and high-grade ductal carcinoma in situ.
Ultrasound-guided core biopsy of an enlarged RIGHT axillary lymph
node demonstrated metastatic breast carcinoma.
TECHNIQUE: Multiplanar, multisequence MR images of both breasts were obtained
prior to and following the intravenous administration of 10 ml of
Gadavist

[Series 4: ax ir · axial · 3.0mm · 0.70mm/px · 1 of 75 slices shown]
[im 1/75]
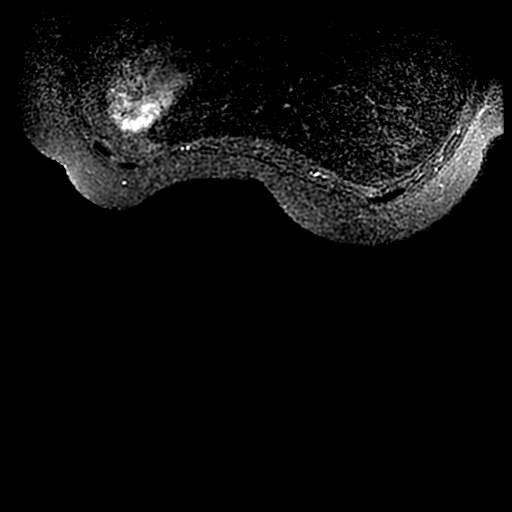

[Series 500: vibrant mph +c · axial · 1.8mm · 0.70mm/px · z∈[-96,+126]mm · 6 of 248 slices shown]
[im 1/248]
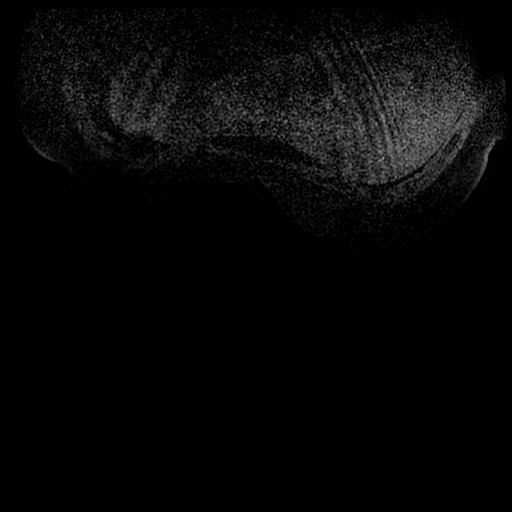
[im 50/248]
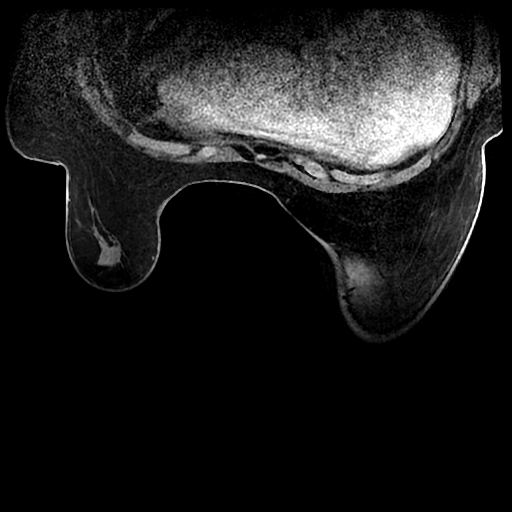
[im 99/248]
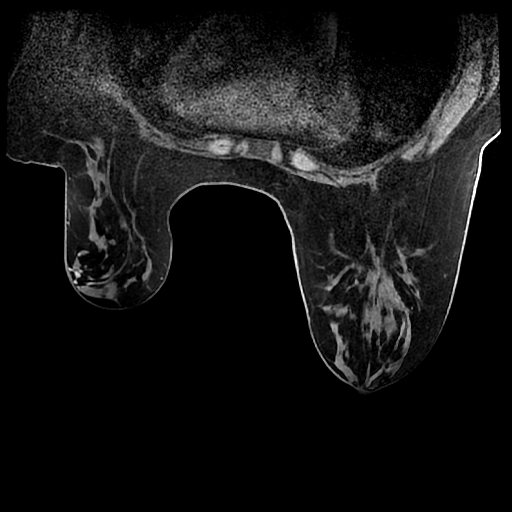
[im 149/248]
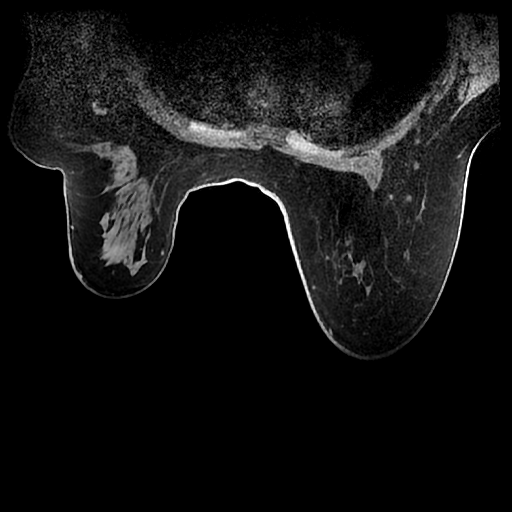
[im 198/248]
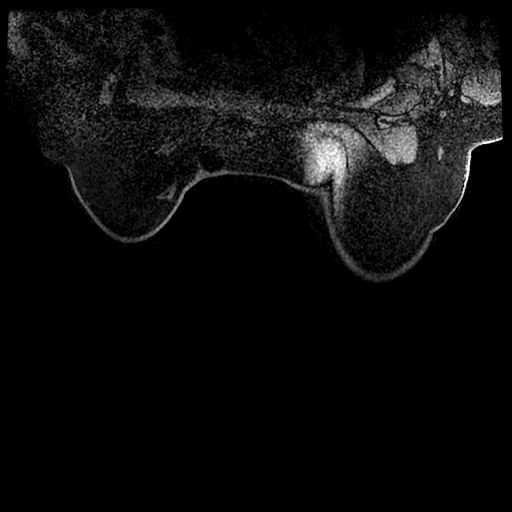
[im 248/248]
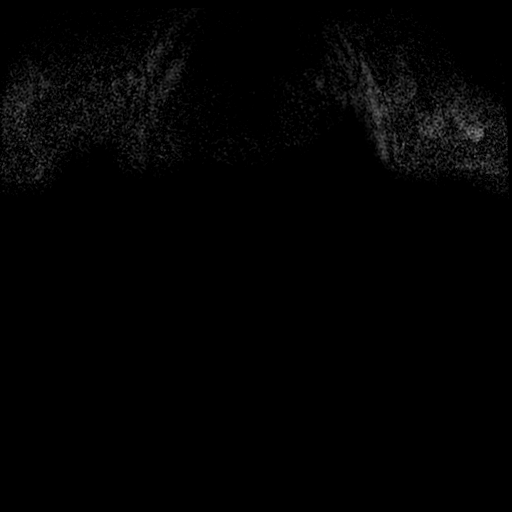

[Series 501: ph1/ vibrant mph · axial · 1.8mm · 0.70mm/px · z∈[-96,+126]mm · 6 of 248 slices shown]
[im 1/248]
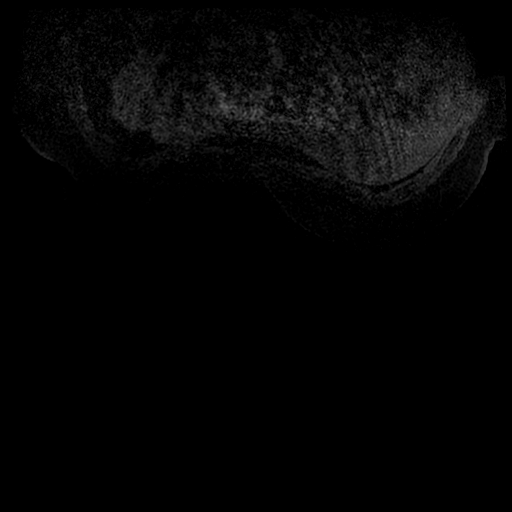
[im 50/248]
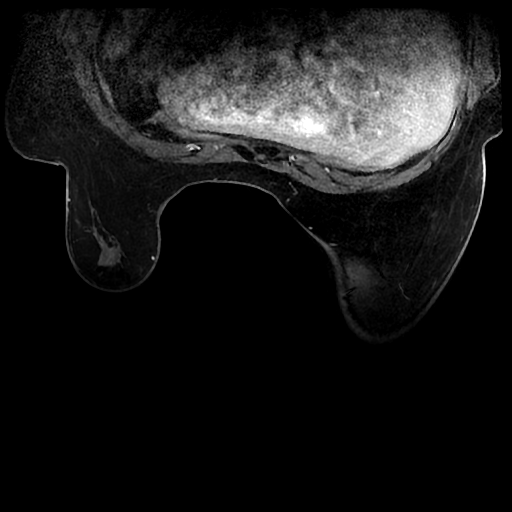
[im 99/248]
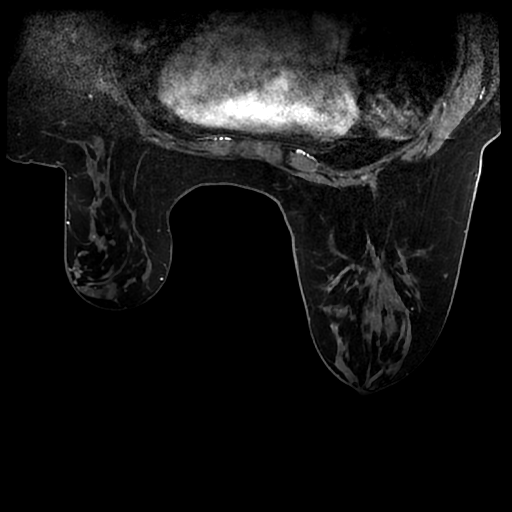
[im 149/248]
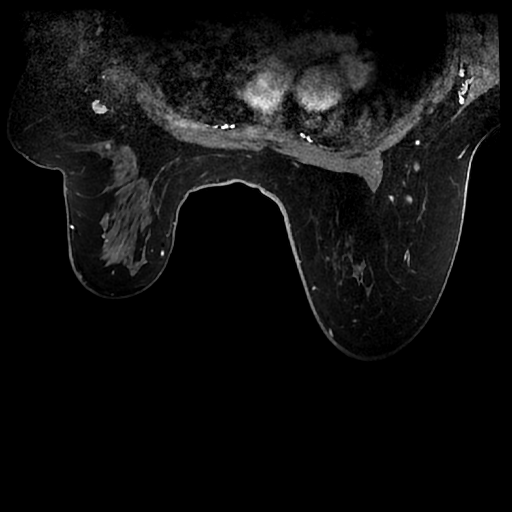
[im 198/248]
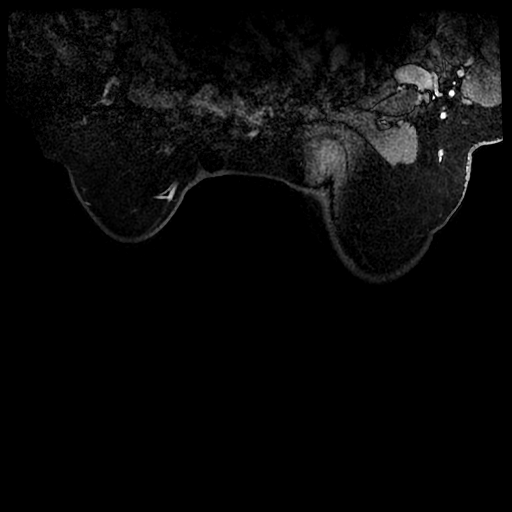
[im 248/248]
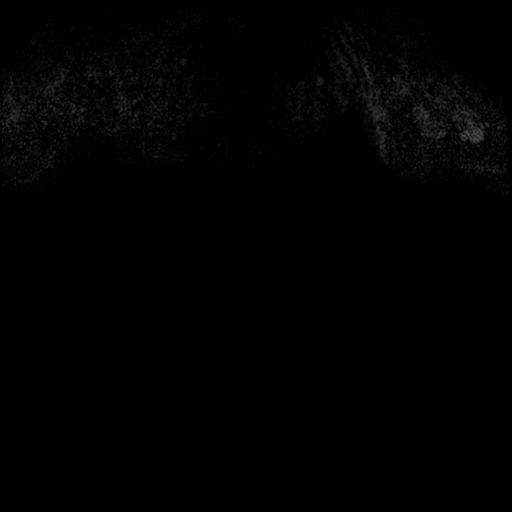

[Series 502: ph2/ vibrant mph · axial · 1.8mm · 0.70mm/px · z∈[-96,+126]mm · 5 of 248 slices shown]
[im 1/248]
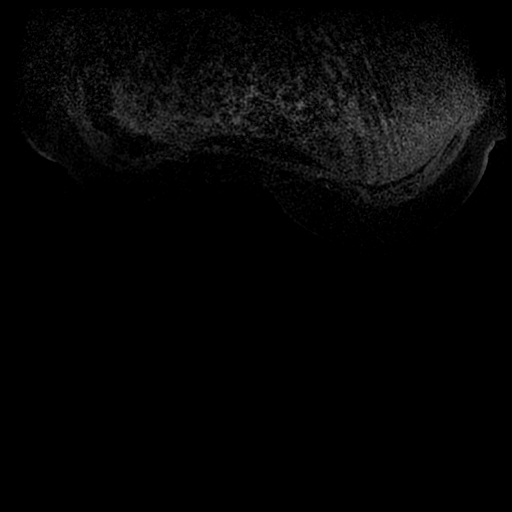
[im 50/248]
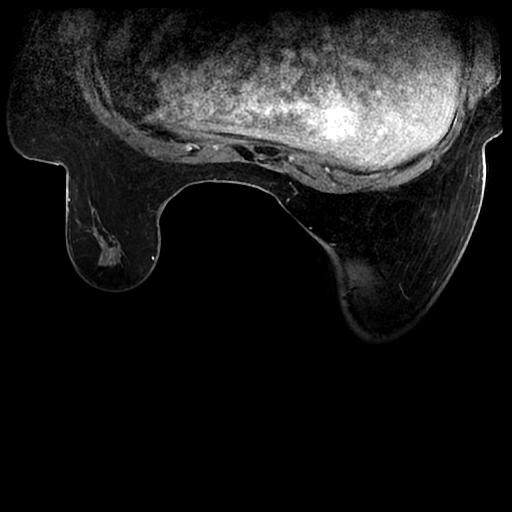
[im 99/248]
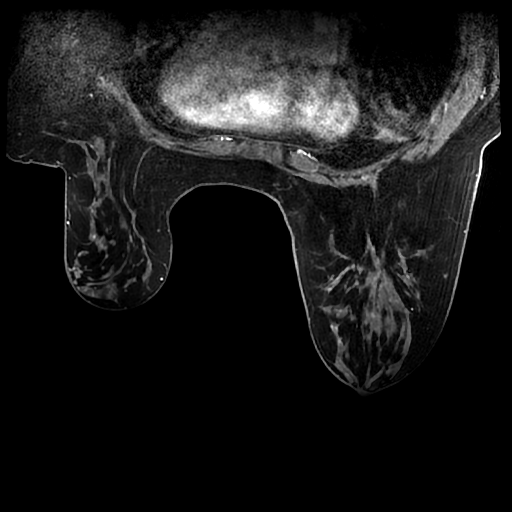
[im 149/248]
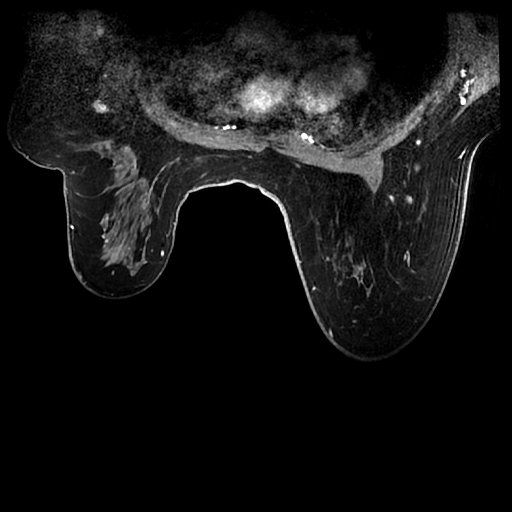
[im 248/248]
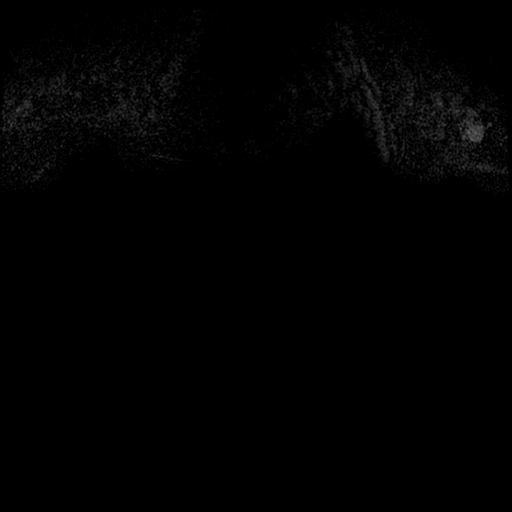

[18 of 48 positions shown; findings below may reference images not displayed]

Subsequent MR guided core biopsy of additional non mass enhancement
demonstrated high-grade ductal carcinoma in situ anterior and
posterior to the known malignancy.

The patient has now undergone neoadjuvant treatment.

LABS:  None obtained at the time of imaging.

EXAM:
BILATERAL BREAST MRI WITH AND WITHOUT CONTRAST
Three-dimensional MR images were rendered by post-processing of the
original MR data on an independent workstation. The
three-dimensional MR images were interpreted, and findings are
reported in the following complete MRI report for this study. Three
dimensional images were evaluated at the independent [employer]
workstation
FINDINGS: Breast composition: c. Heterogeneous fibroglandular tissue.

Background parenchymal enhancement: Moderate.

Right breast: Within the UPPER-OUTER QUADRANT of the RIGHT breast
there is a 4 millimeter nodule with persistent type enhancement
kinetics. There has been resolution of the remainder of enhance
within the LATERAL portion of the RIGHT breast.

Left breast: No mass or abnormal enhancement. LEFT-sided Port-A-Cath
present.

Lymph nodes: No abnormal RIGHT axillary lymph nodes are identified,
significantly improved from the previous study. LEFT axilla remains
negative.

Ancillary findings:  None.
IMPRESSION: 1. Significant improvement in enhancement in the LATERAL portion of
the RIGHT breast. A 4 millimeter enhancing nodule persists in the
UPPER-OUTER QUADRANT of the RIGHT breast.
2. LEFT breast remains negative.
3. Interval resolution of RIGHT axillary lymphadenopathy.

RECOMMENDATION:
Treatment plan for known RIGHT breast malignancy.

BI-RADS CATEGORY  6: Known biopsy-proven malignancy.

## 2020-06-20 ENCOUNTER — Telehealth: Payer: Self-pay

## 2020-06-20 ENCOUNTER — Ambulatory Visit
Admission: RE | Admit: 2020-06-20 | Discharge: 2020-06-20 | Disposition: A | Discharge status: Home / Self Care | Payer: Medicaid Other | Source: Ambulatory Visit | Attending: Oncology | Admitting: Oncology

## 2020-06-20 ENCOUNTER — Other Ambulatory Visit (HOSPITAL_COMMUNITY): Payer: Self-pay | Admitting: Radiology

## 2020-06-20 ENCOUNTER — Other Ambulatory Visit: Payer: Self-pay | Admitting: Oncology

## 2020-06-20 DIAGNOSIS — C50411 Malignant neoplasm of upper-outer quadrant of right female breast: Secondary | ICD-10-CM

## 2020-06-20 DIAGNOSIS — Z171 Estrogen receptor negative status [ER-]: Secondary | ICD-10-CM

## 2020-06-20 MED FILL — predniSONE 50 MG TABS: 50 | 1 days supply | Qty: 3 | Fill #0

## 2020-06-20 NOTE — Telephone Encounter (Signed)
Received call from scheduler stating pt needed 13 hour prep for MRI with contrast. Attempted to call the pt, pt did not answer and unable to leave voicemail on number provided in chart. 13 hour prep was called into [facility] per scheduler and patients chart with the following instructions: Prescription: 06/23/20 0320- 50mg  Prednisone 06/23/20 0920 - 50mg  Prednisone 06/23/20 1520 - 50mg  Prednisone and 50mg  Benadryl  I will attempt to call the patient back to discuss the 13 hour prep with her to ensure she understands the instructions.

## 2020-06-22 ENCOUNTER — Telehealth: Payer: Self-pay

## 2020-06-22 NOTE — Telephone Encounter (Signed)
I was able to reach the patient regarding her 13 hour prep I called into [facility]. All instructions were reviewed with the patient and she verbalized understanding. See previous progress note for details.

## 2020-06-23 ENCOUNTER — Ambulatory Visit
Admission: RE | Admit: 2020-06-23 | Discharge: 2020-06-23 | Disposition: A | Discharge status: Home / Self Care | Payer: Medicaid Other | Source: Ambulatory Visit | Attending: Oncology | Admitting: Oncology

## 2020-06-23 DIAGNOSIS — C50411 Malignant neoplasm of upper-outer quadrant of right female breast: Secondary | ICD-10-CM

## 2020-06-23 DIAGNOSIS — Z171 Estrogen receptor negative status [ER-]: Secondary | ICD-10-CM

## 2020-06-23 MED ORDER — GADOBUTROL 1 MMOL/ML IV SOLN
10.0000 mL | Freq: Once | INTRAVENOUS | Status: AC | PRN
  Administered 2020-06-23: 10 mL via INTRAVENOUS

## 2020-06-25 ENCOUNTER — Other Ambulatory Visit: Payer: Self-pay | Admitting: Oncology

## 2020-06-25 DIAGNOSIS — Z171 Estrogen receptor negative status [ER-]: Secondary | ICD-10-CM

## 2020-06-25 DIAGNOSIS — C50411 Malignant neoplasm of upper-outer quadrant of right female breast: Secondary | ICD-10-CM

## 2020-06-26 ENCOUNTER — Telehealth: Payer: Self-pay

## 2020-06-26 NOTE — Telephone Encounter (Signed)
Pt called and LVM stating she needs Dr [name]'s nurse to call her but left no details. This LPN called pt, but she did not answer and VM bos was full.

## 2020-06-27 ENCOUNTER — Telehealth: Payer: Self-pay | Admitting: *Deleted

## 2020-06-27 NOTE — Telephone Encounter (Signed)
This RN attempted to return VM to pt per her message regarding " I need someone to call me back and explain my MRI results "  Obtained number identified VM stating pt's VM box was full and unable to leave a message.  Note pt had MRI of breast recently due to abnormality noted on U/S done earlier in the month - MRI abnormality is now requesting another 2nd look U/S.

## 2020-07-09 ENCOUNTER — Telehealth: Payer: Self-pay | Admitting: *Deleted

## 2020-07-09 NOTE — Telephone Encounter (Signed)
This RN spoke with pt per her return call ( note missed calls between pt and nurse ) regarding MRI results and request for additional U/S.  Jacqueline Good inquired about area of concern " was it where they biopsied 3 months ago ?"  Note pt had breast reduction surgery on the left breast.  Per review of MRI - noted area of concern is now a lymph node.  Jacqueline Good asked if the lymph node could be from getting her Covid booster on 06/18/2020- MRI was performed on 06/25/2020.  Note MRI was being done due mammogram concern for area related to the mammoplasty and the left node was not noted until the MRI - post pt having Covid booster.  [facility]20 per Covid vaccine protocol.  Jacqueline Good is concerned due having to wait that long for evaluation.  This RN validated the patient's concern - discussed possible lymph node enlargement could be secondary to the booster she received- but that her concern would be forwarded to MD for review and any other recommendations.  Note pt states she is not having breast pain or notable concern.

## 2020-07-10 ENCOUNTER — Other Ambulatory Visit: Payer: Self-pay | Admitting: Oncology

## 2020-07-10 DIAGNOSIS — C50411 Malignant neoplasm of upper-outer quadrant of right female breast: Secondary | ICD-10-CM

## 2020-07-10 DIAGNOSIS — Z171 Estrogen receptor negative status [ER-]: Secondary | ICD-10-CM

## 2020-07-10 MED FILL — tiZANidine HCL 4 MG TABS: 4 | 30 days supply | Qty: 60 | Fill #3

## 2020-07-17 MED FILL — ESOMEPRAZOLE MAG DR 40 MG C: 40 | 30 days supply | Qty: 30 | Fill #2

## 2020-07-17 MED FILL — VENLAFAXINE HCL 75 MG TAB: 75 | 30 days supply | Qty: 90 | Fill #1

## 2020-07-17 MED FILL — ZOLPIDEM TARTRATE 10 MG TAB: 10 | 15 days supply | Qty: 15 | Fill #2

## 2020-07-17 MED FILL — ALPRAZOLAM 1 MG TABS: 1 | 30 days supply | Qty: 105 | Fill #1

## 2020-07-17 MED FILL — LATUDA 20 MG TABLET: 20 | 30 days supply | Qty: 30 | Fill #1

## 2020-07-18 MED FILL — ZOLPIDEM TARTRATE 10 MG TAB: 10 | 15 days supply | Qty: 15 | Fill #3

## 2020-07-19 ENCOUNTER — Other Ambulatory Visit: Payer: Self-pay | Admitting: Oncology

## 2020-07-19 ENCOUNTER — Ambulatory Visit
Admission: RE | Admit: 2020-07-19 | Discharge: 2020-07-19 | Disposition: A | Discharge status: Home / Self Care | Payer: Medicaid Other | Source: Ambulatory Visit | Attending: Oncology | Admitting: Oncology

## 2020-07-19 ENCOUNTER — Other Ambulatory Visit: Payer: Self-pay

## 2020-07-19 DIAGNOSIS — Z171 Estrogen receptor negative status [ER-]: Secondary | ICD-10-CM

## 2020-07-19 DIAGNOSIS — C50411 Malignant neoplasm of upper-outer quadrant of right female breast: Secondary | ICD-10-CM

## 2020-07-24 NOTE — Progress Notes (Addendum)
Virtual Visit via Video Note  I connected with Jacqueline Good on 07/30/20 at 11:30 AM EST by a video enabled telemedicine application and verified that I am speaking with the correct person using two identifiers.  Location: Patient: home Provider: office   I discussed the limitations of evaluation and management by telemedicine and the availability of in person appointments. The patient expressed understanding and agreed to proceed.     I discussed the assessment and treatment plan with the patient. The patient was provided an opportunity to ask questions and all were answered. The patient agreed with the plan and demonstrated an understanding of the instructions.   The patient was advised to call back or seek an in-person evaluation if the symptoms worsen or if the condition fails to improve as anticipated.  I provided 20 minutes of non-face-to-face time during this encounter.   Norman Clay, MD    [facility] MD/PA/NP OP Progress Note  07/30/2020 12:03 PM Jacqueline Good  MRN:  [id number]  Chief Complaint:  Chief Complaint    Follow-up; Depression     HPI:  This is a follow-up appointment for depression.  She states that she feels stressed and depressed.  She does not feel like talking today.  She declined to answer when she was asked about Thanksgiving.  She states that she now has pain in her right breast.  She underwent ultrasound and biopsy.  Although the biopsy states it was scar tissue, and her doctor attributed her pain to recent COVID vaccination, she has a fear that she may have cancer.  Although it has been a little better since she got those test, she has "cancer scare "that she may have cancer.  She started to have insomnia again, and would like to try some medication.  She has initial and middle insomnia.  She snores at night at times.  She has never had sleep study.  She feels fatigue.  She has difficulty in concentration. Although she thinks she may have weight gain, she  has ambivalence in measuring her weight.  She denies SI.  She rarely drinks alcohol.  She denies drug use.    Daily routine: household chores,  Employment: used to work as Research scientist (physical sciences), last in 2017; although the patient was terminated, it was a good timing for the patient as she was taking care of her cousin, who later moved into nursing home. Support: Household: by herself. Her mother lives next door Marital status: single  Number of children:1 daughter, who lives in [place]. She has estranged relationship with the father of her daughter. She grew up in [place].Her parents got divorced when she was 10-year-old.Although she did not like them arguing with each other,she reports good relationship with her mother,who raised the patient and other siblings.    Wt Readings from Last 3 Encounters:  02/09/20 237 lb 4.8 oz (107.6 kg)  01/24/20 237 lb 12.8 oz (107.9 kg)  11/01/19 232 lb (105.2 kg)     Visit Diagnosis:    ICD-10-CM   1. MDD (major depressive disorder), recurrent episode, moderate (HCC)  F33.1   2. Insomnia, unspecified type  G47.00 zolpidem (AMBIEN) 10 MG tablet  3. Generalized anxiety disorder  F41.1     Past Psychiatric History: Please see initial evaluation for full details. I have reviewed the history. No updates at this time.     Past Medical History:  Past Medical History:  Diagnosis Date  . Anemia   . Anxiety   . Arthritis    "  all over" (08/12/2018)  . Asthma   . Breast cancer, right breast ([place]) dx'd 02/2018  . Chronic bronchitis ([place])   . Depression   . Family history of breast cancer   . Family history of prostate cancer   . Family history of uterine cancer   . GERD (gastroesophageal reflux disease)   . History of blood transfusion    "several; related to low blood" (08/12/2018)  . History of radiation therapy 11/15/18- 12/17/18   Right Chest wall, SCV, PAB. 25 fractions.   . Personal history of chemotherapy   . Personal history of radiation therapy      Past Surgical History:  Procedure Laterality Date  . AXILLARY LYMPH NODE DISSECTION Right 08/12/2018   Procedure: AXILLARY LYMPH NODE DISSECTION;  Surgeon: Alphonsa Overall, MD;  Location: [place];  Service: General;  Laterality: Right;  . BREAST BIOPSY Right 03/2018  . BREAST RECONSTRUCTION WITH PLACEMENT OF TISSUE EXPANDER AND FLEX HD (ACELLULAR HYDRATED DERMIS) Right 08/12/2018   Procedure: RIGHT BREAST RECONSTRUCTION WITH PLACEMENT OF TISSUE EXPANDER AND FLEX HD (ACELLULAR HYDRATED DERMIS);  Surgeon: Wallace Going, DO;  Location: [facility];  Service: Plastics;  Laterality: Right;  . BREAST REDUCTION SURGERY Left 09/28/2019   Procedure: LEFT MAMMARY REDUCTION/MASTOPEXY  (BREAST);  Surgeon: Wallace Going, DO;  Location: [facility];  Service: Plastics;  Laterality: Left;  . DILATION AND CURETTAGE OF UTERUS    . ENDOMETRIAL ABLATION    . IR IMAGING GUIDED PORT INSERTION  03/18/2018  . MASTECTOMY MODIFIED RADICAL Right 08/12/2018   Procedure: RIGHT MODIFIED RADICAL MASTECTOMY;  Surgeon: Alphonsa Overall, MD;  Location: [facility];  Service: General;  Laterality: Right;  . MASTECTOMY MODIFIED RADICAL Right 08/12/2018   w/axillary LND  . MYOMECTOMY    . REDUCTION MAMMAPLASTY Left 2021  . REMOVAL OF TISSUE EXPANDER AND PLACEMENT OF IMPLANT Right 10/26/2018   Procedure: Removal of right breast expander;  Surgeon: Wallace Going, DO;  Location: [facility];  Service: Plastics;  Laterality: Right;  75 min, please    Family Psychiatric History: Please see initial evaluation for full details. I have reviewed the history. No updates at this time.     Family History:  Family History  Problem Relation Age of Onset  . Lupus Sister   . Heart disease Sister   . Breast cancer Mother 36  . Prostate cancer Paternal Uncle   . Diabetes Maternal Grandmother   . Heart Problems Maternal Grandmother   . Prostate cancer Maternal Grandfather   . Prostate cancer Paternal Grandfather   . Prostate cancer  Other        MGFs brother  . Breast cancer Other        Mat great-grandmother's sister  . Uterine cancer Maternal Great-grandmother   . Prostate cancer Other        PGFs brother    Social History:  Social History   Socioeconomic History  . Marital status: Single    Spouse name: Not on file  . Number of children: Not on file  . Years of education: Not on file  . Highest education level: Not on file  Occupational History  . Not on file  Tobacco Use  . Smoking status: Former Smoker    Packs/day: 1.00    Years: 38.00    Pack years: 38.00    Types: Cigarettes    Quit date: 05/15/2018    Years since quitting: 2.2  . Smokeless tobacco: Never Used  Vaping Use  .  Vaping Use: Never used  Substance and Sexual Activity  . Alcohol use: Yes    Comment: occ  . Drug use: No  . Sexual activity: Not Currently    Birth control/protection: None  Other Topics Concern  . Not on file  Social History Narrative  . Not on file   Social Determinants of Health   Financial Resource Strain: Not on file  Food Insecurity: Not on file  Transportation Needs: Not on file  Physical Activity: Not on file  Stress: Not on file  Social Connections: Not on file    Allergies:  Allergies  Allergen Reactions  . Gadolinium Derivatives Itching and Cough    Pt began sneezing and coughing as soon as MRI contrast was injected. Severe nasal congestion. No rash or hives no SOB.   Marland Kitchen Hydrocodone Hives and Itching    Pt says she can take it with benadryl.   . Iodinated Diagnostic Agents Itching    Metabolic Disorder Labs: Lab Results  Component Value Date   HGBA1C 5.4 04/27/2014   No results found for: PROLACTIN Lab Results  Component Value Date   CHOL 201 (H) 02/18/2019   TRIG 115 02/18/2019   HDL 63 02/18/2019   CHOLHDL 3.2 02/18/2019   VLDL 23 02/18/2019   LDLCALC 115 (H) 02/18/2019   LDLCALC 76 09/24/2016   Lab Results  Component Value Date   TSH 0.58 09/24/2016   TSH 0.696 04/27/2014     Therapeutic Level Labs: No results found for: LITHIUM No results found for: VALPROATE No components found for:  CBMZ  Current Medications: Current Outpatient Medications  Medication Sig Dispense Refill  . [START ON 08/17/2020] ALPRAZolam (XANAX) 1 MG tablet 1 mg three times and 0.5 mg per day as needed for anxiety. No more than 3.5 mg per day 105 tablet 0  . esomeprazole (NEXIUM) 40 MG capsule TAKE 1 CAPSULE BY MOUTH ONCE DAILY 30 capsule 2  . fluticasone (FLONASE) 50 MCG/ACT nasal spray Place 2 sprays into both nostrils daily as needed for up to 14 days for allergies. 1 g 1  . Fluticasone-Salmeterol (ADVAIR DISKUS) 250-50 MCG/DOSE AEPB INHALE 1 PUFF INTO THE LUNGS 2 TIMES DAILY. 60 each 2  . loratadine (CLARITIN) 10 MG tablet Take 1 tablet (10 mg total) by mouth daily as needed for allergies. 30 tablet 11  . lurasidone (LATUDA) 20 MG TABS tablet Take 1 tablet (20 mg total) by mouth daily. 90 tablet 0  . lurasidone (LATUDA) 40 MG TABS tablet Take 1 tablet (40 mg total) by mouth daily with breakfast. 30 tablet 1  . mirtazapine (REMERON) 15 MG tablet Take 1 tablet (15 mg total) by mouth at bedtime. 90 tablet 0  . naproxen (NAPROSYN) 500 MG tablet Take 1 tablet (500 mg total) by mouth 2 (two) times daily with a meal. 60 tablet 2  . omeprazole (PRILOSEC) 40 MG capsule TAKE 1 CAPSULE BY MOUTH DAILY. 30 capsule 2  . propranolol (INDERAL) 40 MG tablet Take 40 mg by mouth daily as needed (heart palpitation.).     Marland Kitchen tiZANidine (ZANAFLEX) 4 MG tablet     . venlafaxine (EFFEXOR) 75 MG tablet Take 3 tablets (225 mg total) by mouth daily. 270 tablet 0  . [START ON 08/16/2020] zolpidem (AMBIEN) 10 MG tablet Take 1 tablet (10 mg total) by mouth at bedtime as needed for sleep. 30 tablet 0   No current facility-administered medications for this visit.     Musculoskeletal: Strength & Muscle Tone: N/A Gait &  Station: N/A Patient leans: N/A  Psychiatric Specialty Exam: Review of Systems   Psychiatric/Behavioral: Positive for decreased concentration, dysphoric mood and sleep disturbance. Negative for agitation, behavioral problems, confusion, hallucinations, self-injury and suicidal ideas. The patient is nervous/anxious. The patient is not hyperactive.   All other systems reviewed and are negative.   There were no vitals taken for this visit.There is no height or weight on file to calculate BMI.  General Appearance: Fairly Groomed  Eye Contact:  Good  Speech:  Clear and Coherent  Volume:  Normal  Mood:  Depressed  Affect:  Appropriate, Congruent, Restricted and Tearful  Thought Process:  Coherent  Orientation:  Full (Time, Place, and Person)  Thought Content: Logical   Suicidal Thoughts:  No  Homicidal Thoughts:  No  Memory:  Immediate;   Good  Judgement:  Good  Insight:  Good  Psychomotor Activity:  Normal  Concentration:  Concentration: Good and Attention Span: Good  Recall:  Good  Fund of Knowledge: Good  Language: Good  Akathisia:  No  Handed:  Right  AIMS (if indicated): not done  Assets:  Communication Skills  ADL's:  Intact  Cognition: WNL  Sleep:  Poor   Screenings: GAD-7   Flowsheet Row Office Visit from 04/22/2018 in [place] Office Visit from 06/03/2017 in [place] [facility] Office Visit from 09/24/2016 in [facility] Office Visit from 03/27/2016 in [facility]  Total GAD-7 Score 11 3 15 16     PHQ2-9   [place] Office Visit from 04/22/2018 in [facility] Office Visit from 06/03/2017 in [facility] Office Visit from 09/24/2016 in [facility] Office Visit from 05/01/2016 in [facility] Office Visit from 03/27/2016 in [facility]  PHQ-2 Total Score 5 0 2 5 2   PHQ-9 Total Score 15 4 7 8 10         Assessment and Plan:  Jacqueline Good is a 59 y.o. year old female with a history of depression,OCD,estrogen negativeT2 pN1, stage IIB invasive ductal carcinoma, diagnosed07/18/2019s/p mastectomy1/2020,neoadjuvantchemo immunotherapy/on trastzumab,asthma, OA, chronic back pain, who presents for follow up appointment for below.     1. MDD (major depressive disorder), recurrent episode, moderate ([place]) 2. Generalized anxiety disorder She reports worsening in depressive symptoms and anxiety in the context of fear of recurrence of malignancy.  Psychosocial stressors includes history of breast cancer, chronic pain, unemployment, and taking care of her mother with medical condition.  Will uptitrate Latuda to optimize treatment for depression.  Discussed potential metabolic side effect and EPS.  Will continue venlafaxine to target depression and anxiety.  We will continue mirtazapine to target depression.  Discussed potential metabolic side effect.  Will continue Xanax as needed for anxiety. She is aware of its potential risk of dependence and oversedation.   3. Insomnia, unspecified type She reports worsening in insomnia, which is likely attributed to anxiety.  Although she does have snoring at night, she is not interested in referral for sleep evaluation.  We will continue Ambien to target insomnia.   Plan I have reviewed and updated plans as below 1. Continue venlafaxine 225 mg daily 2.Increase Latuda40mg , take at night- monitor weight gain 3.Continue mirtazapine15 mg at night  4.Continue Xanax 1 mg three times a day (and additional 0.5 mg daily) as needed for anxiety 5. Continue Ambien  10 mg at night as needed for insomnia 6. Next appointment:1/24 at 3:10 for 30 mins, video   Past trials of medication: sertraline, fluoxetine, lexapro,Buspar,venlafaxine, duloxetine, bupropion, quetiapine, Abilify  The patient demonstrates the following risk factors for suicide:  Chronic risk factors for suicide include:psychiatric disorder ofdepressionand medical illness of breast cancer. Acute risk factorsfor suicide include: unemployment and loss (financial, interpersonal, professional). Protective factorsfor this patient include: positive social support, responsibility to others (children, family), coping skills and hope for the future. Considering these factors, the overall suicide risk at this point appears to below. Patientisappropriate for outpatient follow up.  Norman Clay, MD 07/30/2020, 12:03 PM

## 2020-07-30 ENCOUNTER — Other Ambulatory Visit: Payer: Medicaid Other

## 2020-07-30 ENCOUNTER — Other Ambulatory Visit: Payer: Self-pay | Admitting: Psychiatry

## 2020-07-30 ENCOUNTER — Telehealth (INDEPENDENT_AMBULATORY_CARE_PROVIDER_SITE_OTHER): Payer: Medicaid Other | Admitting: Psychiatry

## 2020-07-30 ENCOUNTER — Encounter: Payer: Self-pay | Admitting: Psychiatry

## 2020-07-30 ENCOUNTER — Other Ambulatory Visit: Payer: Self-pay

## 2020-07-30 ENCOUNTER — Telehealth (HOSPITAL_COMMUNITY): Payer: Medicaid Other | Admitting: Psychiatry

## 2020-07-30 DIAGNOSIS — F331 Major depressive disorder, recurrent, moderate: Secondary | ICD-10-CM

## 2020-07-30 DIAGNOSIS — F411 Generalized anxiety disorder: Secondary | ICD-10-CM

## 2020-07-30 DIAGNOSIS — G4700 Insomnia, unspecified: Secondary | ICD-10-CM | POA: Diagnosis not present

## 2020-07-30 MED ORDER — ZOLPIDEM TARTRATE 10 MG PO TABS: 10.0000 mg | ORAL_TABLET | Freq: Every evening | ORAL | 0 refills | Status: DC | PRN

## 2020-07-30 MED ORDER — LURASIDONE HCL 40 MG PO TABS: 40.0000 mg | ORAL_TABLET | Freq: Every day | ORAL | 1 refills | Status: DC

## 2020-07-30 MED ORDER — ALPRAZOLAM 1 MG PO TABS
ORAL_TABLET | ORAL | 0 refills | Status: DC
Start: 2020-08-17 — End: 2020-09-03

## 2020-07-30 MED FILL — LATUDA 40 MG TABLET: 40 | 30 days supply | Qty: 30 | Fill #0

## 2020-07-30 NOTE — Patient Instructions (Addendum)
1. Continue venlafaxine 225 mg daily 2.Increase Latuda40mg , take at night 3.Continue mirtazapine15 mg at night  4.Continue Xanax 1 mg three times a day (and additional 0.5 mg daily) as needed for anxiety 5. Continue Ambien 10 mg at night as needed for insomnia 6. Next appointment:1/24 at 3:10

## 2020-08-13 ENCOUNTER — Ambulatory Visit: Payer: Medicaid Other | Admitting: Oncology

## 2020-08-13 ENCOUNTER — Other Ambulatory Visit: Payer: Medicaid Other

## 2020-08-14 MED FILL — VENLAFAXINE HCL 75 MG TAB: 75 | 30 days supply | Qty: 90 | Fill #2

## 2020-08-14 MED FILL — MIRTAZAPINE 15 MG TABS: 15 | 90 days supply | Qty: 90 | Fill #0

## 2020-08-14 MED FILL — LATUDA 40 MG TABLET: 40 | 30 days supply | Qty: 30 | Fill #0

## 2020-08-15 ENCOUNTER — Inpatient Hospital Stay: Payer: Medicaid Other | Attending: Oncology

## 2020-08-15 ENCOUNTER — Inpatient Hospital Stay (HOSPITAL_BASED_OUTPATIENT_CLINIC_OR_DEPARTMENT_OTHER): Payer: Medicaid Other | Admitting: Oncology

## 2020-08-15 ENCOUNTER — Other Ambulatory Visit: Payer: Self-pay

## 2020-08-15 VITALS — BP 110/82 | HR 75 | Temp 97.5°F | Resp 18 | Ht 65.0 in | Wt 240.4 lb

## 2020-08-15 DIAGNOSIS — F429 Obsessive-compulsive disorder, unspecified: Secondary | ICD-10-CM

## 2020-08-15 DIAGNOSIS — Z1379 Encounter for other screening for genetic and chromosomal anomalies: Secondary | ICD-10-CM

## 2020-08-15 DIAGNOSIS — Z923 Personal history of irradiation: Secondary | ICD-10-CM | POA: Insufficient documentation

## 2020-08-15 DIAGNOSIS — J441 Chronic obstructive pulmonary disease with (acute) exacerbation: Secondary | ICD-10-CM

## 2020-08-15 DIAGNOSIS — D3502 Benign neoplasm of left adrenal gland: Secondary | ICD-10-CM | POA: Insufficient documentation

## 2020-08-15 DIAGNOSIS — Z803 Family history of malignant neoplasm of breast: Secondary | ICD-10-CM | POA: Diagnosis not present

## 2020-08-15 DIAGNOSIS — C50411 Malignant neoplasm of upper-outer quadrant of right female breast: Secondary | ICD-10-CM | POA: Diagnosis present

## 2020-08-15 DIAGNOSIS — Z9011 Acquired absence of right breast and nipple: Secondary | ICD-10-CM | POA: Diagnosis not present

## 2020-08-15 DIAGNOSIS — Z9221 Personal history of antineoplastic chemotherapy: Secondary | ICD-10-CM | POA: Insufficient documentation

## 2020-08-15 DIAGNOSIS — Z87891 Personal history of nicotine dependence: Secondary | ICD-10-CM | POA: Insufficient documentation

## 2020-08-15 DIAGNOSIS — F32A Depression, unspecified: Secondary | ICD-10-CM | POA: Insufficient documentation

## 2020-08-15 DIAGNOSIS — F419 Anxiety disorder, unspecified: Secondary | ICD-10-CM | POA: Diagnosis not present

## 2020-08-15 DIAGNOSIS — Z79899 Other long term (current) drug therapy: Secondary | ICD-10-CM | POA: Insufficient documentation

## 2020-08-15 DIAGNOSIS — Z171 Estrogen receptor negative status [ER-]: Secondary | ICD-10-CM | POA: Insufficient documentation

## 2020-08-15 DIAGNOSIS — C773 Secondary and unspecified malignant neoplasm of axilla and upper limb lymph nodes: Secondary | ICD-10-CM | POA: Insufficient documentation

## 2020-08-15 DIAGNOSIS — J45901 Unspecified asthma with (acute) exacerbation: Secondary | ICD-10-CM

## 2020-08-15 LAB — COMPREHENSIVE METABOLIC PANEL
ALT: 29 U/L (ref 0–44)
AST: 20 U/L (ref 15–41)
Albumin: 3.9 g/dL (ref 3.5–5.0)
Alkaline Phosphatase: 83 U/L (ref 38–126)
Anion gap: 11 (ref 5–15)
BUN: 19 mg/dL (ref 6–20)
CO2: 23 mmol/L (ref 22–32)
Calcium: 9.5 mg/dL (ref 8.9–10.3)
Chloride: 106 mmol/L (ref 98–111)
Creatinine, Ser: 0.96 mg/dL (ref 0.44–1.00)
GFR, Estimated: >60 mL/min (ref >60)
Glucose, Bld: 82 mg/dL (ref 70–99)
Potassium: 3.8 mmol/L (ref 3.5–5.1)
Sodium: 140 mmol/L (ref 135–145)
Total Bilirubin: 1 mg/dL (ref 0.3–1.2)
Total Protein: 7.5 g/dL (ref 6.5–8.1)

## 2020-08-15 LAB — CBC WITH DIFFERENTIAL/PLATELET
Abs Immature Granulocytes: 0.01 10*3/uL (ref 0.00–0.07)
Basophils Absolute: 0 10*3/uL (ref 0.0–0.1)
Basophils Relative: 0 %
Eosinophils Absolute: 0.1 10*3/uL (ref 0.0–0.5)
Eosinophils Relative: 1 %
HCT: 39.8 % (ref 36.0–46.0)
Hemoglobin: 13 g/dL (ref 12.0–15.0)
Immature Granulocytes: 0 %
Lymphocytes Relative: 22 %
Lymphs Abs: 1.5 10*3/uL (ref 0.7–4.0)
MCH: 29.1 pg (ref 26.0–34.0)
MCHC: 32.7 g/dL (ref 30.0–36.0)
MCV: 89 fL (ref 80.0–100.0)
Monocytes Absolute: 0.3 10*3/uL (ref 0.1–1.0)
Monocytes Relative: 5 %
Neutro Abs: 4.8 10*3/uL (ref 1.7–7.7)
Neutrophils Relative %: 72 %
Platelets: 221 10*3/uL (ref 150–400)
RBC: 4.47 MIL/uL (ref 3.87–5.11)
RDW: 14.5 % (ref 11.5–15.5)
WBC: 6.7 10*3/uL (ref 4.0–10.5)
nRBC: 0 % (ref 0.0–0.2)

## 2020-08-15 MED FILL — ZOLPIDEM TARTRATE 10 MG TAB: 10 | 30 days supply | Qty: 15 | Fill #0

## 2020-08-15 NOTE — Progress Notes (Signed)
[facility] [facility]  [phone] [phone]    ID: Jacqueline Good DOB: May 23, 1961  MR#: [id number]  TDV#:[id number]  Patient Care Team: Vonna Drafts, FNP as PCP - General (Nurse Practitioner) Alphonsa Overall, MD as Consulting Physician (General Surgery) Hasson Gaspard, [name], MD as Consulting Physician (Oncology) Eppie Gibson, MD as Attending Physician (Radiation Oncology) Dillingham, Loel Lofty, DO as Attending Physician (Plastic Surgery) Norman Clay, MD as Consulting Physician (Psychiatry) Savas, Allison Quarry, MD as Consulting Physician (Dermatology) OTHER MD: Donnal Moat, PA-C [facility] [phone]]   CHIEF COMPLAINT: 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 She continues under observation.  Since her last visit, she underwent repeat left diagnostic mammogram and left breast ultrasound on 06/13/2020 for short-term follow up of focal distortion. This showed: breast density category C; persistent large area of distortion within outer left breast, which was previously biopsied showing postsurgical changes and fibrosis.  The recommendation was to proceed with breast MRI for further evaluation. Performed on 06/23/2020, MRI showed: breast composition C; no evidence of malignancy involving left breast or right mastectomy site; enlarged 3 cm intramammary lymph node in upper-inner quadrant of left breast with cortical thickening up to 7 mm, though node is of normal morphology.  She then underwent second-look left axilla ultrasound on 07/19/2020 showing: no pathologic lymphadenopathy in axillary tail of left breast or left axilla; the lymph node identified on prior MRI now has a cortical thickness of 4 mm, confirming benignity.   REVIEW OF SYSTEMS: Jacqueline Good has pain in both legs, and pretty much the whole  leg.  It does not localize and is not just the knees.  There is no associated swelling or redness she says.  When she stands for a long time she just has to sit down.  She has tingling in her hands, but not her feet.  This is worse in the evening and the left hand is a little worse than the right.  Sometimes she has trouble swallowing.  This does not happen with liquids.  It is very inconstant.  She tells me she needs to get her eyes checked.  She has low back pain which is longstanding.  She is not exercising regularly but occasionally she does some stretches.  A detailed review of systems was otherwise stable   COVID 19 VACCINATION STATUS: Status post Pfizer x2 with booster November 2021    HISTORY OF CURRENT ILLNESS: From the original intake note:  Jacqueline Good had routine screening mammography on 02/05/2018 showing a possible abnormality in the right breast. She underwent unilateral right diagnostic mammography with tomography and right breast ultrasonography at [facility] on 02/15/2018 showing: Highly suspicious right breast mass at 10 o'clock position.  There was a second, 0.6 cm lesion at the 10:00 radiant which has not been biopsied.  Suspicious focal cortical thickening of a single right axillary lymph node.  Accordingly on 02/25/2018 she proceeded to biopsy of the right breast area in question. The pathology from this procedure showed ([id number]): Breast, right, needle core biopsy, OUQ with microscopic focus of invasive ductal carcinoma, grade 2, arising in a background of high grade ductal carcinoma in situ. 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   The patient's subsequent history is as detailed below.   PAST MEDICAL HISTORY: Past Medical History:  Diagnosis  Date  . Anemia   . Anxiety   . Arthritis    "all over" (08/12/2018)  . Asthma   . Breast cancer, right breast ([place]) dx'd 02/2018  . Chronic bronchitis ([place])    . Depression   . Family history of breast cancer   . Family history of prostate cancer   . Family history of uterine cancer   . GERD (gastroesophageal reflux disease)   . History of blood transfusion    "several; related to low blood" (08/12/2018)  . History of radiation therapy 11/15/18- 12/17/18   Right Chest wall, SCV, PAB. 25 fractions.   . Personal history of chemotherapy   . Personal history of radiation therapy     PAST SURGICAL HISTORY: Past Surgical History:  Procedure Laterality Date  . AXILLARY LYMPH NODE DISSECTION Right 08/12/2018   Procedure: AXILLARY LYMPH NODE DISSECTION;  Surgeon: Alphonsa Overall, MD;  Location: [facility];  Service: General;  Laterality: Right;  . BREAST BIOPSY Right 03/2018  . BREAST RECONSTRUCTION WITH PLACEMENT OF TISSUE EXPANDER AND FLEX HD (ACELLULAR HYDRATED DERMIS) Right 08/12/2018   Procedure: RIGHT BREAST RECONSTRUCTION WITH PLACEMENT OF TISSUE EXPANDER AND FLEX HD (ACELLULAR HYDRATED DERMIS);  Surgeon: Wallace Going, DO;  Location: [facility];  Service: Plastics;  Laterality: Right;  . BREAST REDUCTION SURGERY Left 09/28/2019   Procedure: LEFT MAMMARY REDUCTION/MASTOPEXY  (BREAST);  Surgeon: Wallace Going, DO;  Location: [facility];  Service: Plastics;  Laterality: Left;  . DILATION AND CURETTAGE OF UTERUS    . ENDOMETRIAL ABLATION    . IR IMAGING GUIDED PORT INSERTION  03/18/2018  . MASTECTOMY MODIFIED RADICAL Right 08/12/2018   Procedure: RIGHT MODIFIED RADICAL MASTECTOMY;  Surgeon: Alphonsa Overall, MD;  Location: [facility];  Service: General;  Laterality: Right;  . MASTECTOMY MODIFIED RADICAL Right 08/12/2018   w/axillary LND  . MYOMECTOMY    . REDUCTION MAMMAPLASTY Left 2021  . REMOVAL OF TISSUE EXPANDER AND PLACEMENT OF IMPLANT Right 10/26/2018   Procedure: Removal of right breast expander;  Surgeon: Wallace Going, DO;  Location: [facility];  Service: Plastics;  Laterality: Right;  75 min, please    FAMILY HISTORY: Family History   Problem Relation Age of Onset  . Lupus Sister   . Heart disease Sister   . Breast cancer Mother 63  . Prostate cancer Paternal Uncle   . Diabetes Maternal Grandmother   . Heart Problems Maternal Grandmother   . Prostate cancer Maternal Grandfather   . Prostate cancer Paternal Grandfather   . Prostate cancer Other        MGFs brother  . Breast cancer Other        Mat great-grandmother's sister  . Uterine cancer Maternal Great-grandmother   . Prostate cancer Other        PGFs brother   She notes (August 2019) that her father is currently 43 years old. Patients' mother is currently 68 years old and she was diagnosed with breast cancer at age 68. The patient has 4 brothers and 1 sister. She has a maternal aunt with breast cancer and her maternal great grandmother with had ovarian cancer.    GYNECOLOGIC HISTORY:  No LMP recorded. Patient is postmenopausal. Menarche: 60 years old Age at first live birth: 60 years old GX P: 1 LMP: 13-14 years ago s/p ablation Contraceptive: no HRT: no  Hysterectomy: no SO: no   SOCIAL HISTORY: She is currently unemployed.  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 She lives  alone without pets. Her daughter is [name] who lives in [place] and works in Therapist, art.  The patient has one grandchild. She goes to a [employer].   ADVANCED DIRECTIVES: Not in place.  At the 03/03/2018 visit the patient was given the appropriate documents to complete and notarized at her discretion   HEALTH MAINTENANCE: Social History   Tobacco Use  . Smoking status: Former Smoker    Packs/day: 1.00    Years: 38.00    Pack years: 38.00    Types: Cigarettes    Quit date: 05/15/2018    Years since quitting: 2.2  . Smokeless tobacco: Never Used  Vaping Use  . Vaping Use: Never used  Substance Use Topics  . Alcohol use: Yes    Comment: occ  . Drug use: No     Colonoscopy: Never  PAP:   Bone density: Never   Allergies   Allergen Reactions  . Gadolinium Derivatives Itching and Cough    Pt began sneezing and coughing as soon as MRI contrast was injected. Severe nasal congestion. No rash or hives no SOB.   Marland Kitchen Hydrocodone Hives and Itching    Pt says she can take it with benadryl.   . Iodinated Diagnostic Agents Itching    Current Outpatient Medications  Medication Sig Dispense Refill  . [START ON 08/17/2020] ALPRAZolam (XANAX) 1 MG tablet 1 mg three times and 0.5 mg per day as needed for anxiety. No more than 3.5 mg per day 105 tablet 0  . esomeprazole (NEXIUM) 40 MG capsule TAKE 1 CAPSULE BY MOUTH ONCE DAILY 30 capsule 2  . fluticasone (FLONASE) 50 MCG/ACT nasal spray Place 2 sprays into both nostrils daily as needed for up to 14 days for allergies. 1 g 1  . Fluticasone-Salmeterol (ADVAIR DISKUS) 250-50 MCG/DOSE AEPB INHALE 1 PUFF INTO THE LUNGS 2 TIMES DAILY. 60 each 2  . loratadine (CLARITIN) 10 MG tablet Take 1 tablet (10 mg total) by mouth daily as needed for allergies. 30 tablet 11  . lurasidone (LATUDA) 20 MG TABS tablet Take 1 tablet (20 mg total) by mouth daily. 90 tablet 0  . lurasidone (LATUDA) 40 MG TABS tablet Take 1 tablet (40 mg total) by mouth daily with breakfast. 30 tablet 1  . mirtazapine (REMERON) 15 MG tablet Take 1 tablet (15 mg total) by mouth at bedtime. 90 tablet 0  . naproxen (NAPROSYN) 500 MG tablet Take 1 tablet (500 mg total) by mouth 2 (two) times daily with a meal. 60 tablet 2  . omeprazole (PRILOSEC) 40 MG capsule TAKE 1 CAPSULE BY MOUTH DAILY. 30 capsule 2  . propranolol (INDERAL) 40 MG tablet Take 40 mg by mouth daily as needed (heart palpitation.).     Marland Kitchen tiZANidine (ZANAFLEX) 4 MG tablet     . venlafaxine (EFFEXOR) 75 MG tablet Take 3 tablets (225 mg total) by mouth daily. 270 tablet 0  . [START ON 08/16/2020] zolpidem (AMBIEN) 10 MG tablet Take 1 tablet (10 mg total) by mouth at bedtime as needed for sleep. 30 tablet 0   No current facility-administered medications for this  visit.    OBJECTIVE: African-American woman in no acute distress  Vitals:   08/15/20 1506  BP: 110/82  Pulse: 75  Resp: 18  Temp: (!) 97.5 F (36.4 C)  SpO2: 95%     Body mass index is 40 kg/m.   Wt Readings from Last 3 Encounters:  08/15/20 240 lb 6.4 oz (109 kg)  02/09/20 237 lb  4.8 oz (107.6 kg)  01/24/20 237 lb 12.8 oz (107.9 kg)   Sclerae unicteric, EOMs intact Wearing a mask No cervical or supraclavicular adenopathy Lungs no rales or rhonchi Heart regular rate and rhythm Abd soft, obese, nontender, positive bowel sounds MSK no focal spinal tenderness, no upper extremity lymphedema Neuro: nonfocal, well oriented, appropriate affect Breasts: The right breast is status post mastectomy.  The incision is somewhat irregular but there is no evidence of local recurrence.  The left breast is status post reduction mammoplasty.  It is otherwise unremarkable.  Both axillae are benign   LAB RESULTS:  CMP     Component Value Date/Time   NA 141 02/09/2020 1409   K 4.0 02/09/2020 1409   CL 106 02/09/2020 1409   CO2 24 02/09/2020 1409   GLUCOSE 87 02/09/2020 1409   BUN 18 02/09/2020 1409   CREATININE 0.92 02/09/2020 1409   CREATININE 0.83 09/24/2016 1151   CALCIUM 9.4 02/09/2020 1409   PROT 7.6 02/09/2020 1409   ALBUMIN 3.9 02/09/2020 1409   AST 18 02/09/2020 1409   ALT 28 02/09/2020 1409   ALKPHOS 112 02/09/2020 1409   BILITOT 0.5 02/09/2020 1409   GFRNONAA >60 02/09/2020 1409   GFRNONAA 79 09/24/2016 1151   GFRAA >60 02/09/2020 1409   GFRAA >89 09/24/2016 1151    No results found for: TOTALPROTELP, ALBUMINELP, A1GS, A2GS, BETS, BETA2SER, GAMS, MSPIKE, SPEI  No results found for: KPAFRELGTCHN, LAMBDASER, KAPLAMBRATIO  Lab Results  Component Value Date   WBC 6.7 08/15/2020   NEUTROABS 4.8 08/15/2020   HGB 13.0 08/15/2020   HCT 39.8 08/15/2020   MCV 89.0 08/15/2020   PLT 221 08/15/2020   No results found for: LABCA2  No components found for: [id number]  No  results for input(s): INR in the last 168 hours.  No results found for: LABCA2  No results found for: NWG956  No results found for: OZH086  No results found for: VHQ469  No results found for: CA2729  No components found for: HGQUANT  No results found for: CEA1 / No results found for: CEA1   No results found for: AFPTUMOR  No results found for: CHROMOGRNA  No results found for: HGBA, HGBA2QUANT, HGBFQUANT, HGBSQUAN (Hemoglobinopathy evaluation)   No results found for: LDH  No results found for: IRON, TIBC, IRONPCTSAT (Iron and TIBC)  No results found for: FERRITIN  Urinalysis    Component Value Date/Time   COLORURINE CANCELED 09/24/2016 1151   APPEARANCEUR CANCELED 09/24/2016 1151   LABSPEC CANCELED 09/24/2016 1151   PHURINE CANCELED 09/24/2016 1151   GLUCOSEU CANCELED 09/24/2016 1151   HGBUR CANCELED 09/24/2016 1151   BILIRUBINUR CANCELED 09/24/2016 1151   KETONESUR CANCELED 09/24/2016 1151   PROTEINUR CANCELED 09/24/2016 1151   NITRITE CANCELED 09/24/2016 1151   LEUKOCYTESUR CANCELED 09/24/2016 1151     STUDIES:  [place] AXILLA LEFT  Result Date: 07/19/2020 CLINICAL DATA:  60 year old with recent MRI demonstrating possible LEFT axillary lymphadenopathy. Personal history of malignant RIGHT mastectomy in January, 2020 and LEFT breast reduction mammoplasty in February, 2021. The patient had her COVID-19 vaccine booster in the LEFT arm on 06/18/2020. EXAM: ULTRASOUND OF THE LEFT BREAST/AXILLA COMPARISON:  Previous exam(s). FINDINGS: Multiple lymph nodes are identified in the axillary tail of the LEFT breast and in the LEFT axilla, all of which are normal in appearance with normal morphology and normal cortical thickness of 4 mm and less. No pathologic lymphadenopathy is identified. The LEFT axillary lymph node which demonstrated a cortical thickness  of 7 mm on the MRI now has a cortical thickness of 4 mm, confirming benignity. IMPRESSION: No pathologic lymphadenopathy in the  axillary tail of the LEFT breast or the LEFT axilla. The lymph node identified on the prior MRI with cortical thickening up to 7 mm now has a cortical thickness of 4 mm, confirming benignity. RECOMMENDATION: Annual screening LEFT mammogram which is due in November, 2022. I have discussed the findings and recommendations with the patient. If applicable, a reminder letter will be sent to the patient regarding the next appointment. BI-RADS CATEGORY  2: Benign. Electronically Signed   By: Evangeline Dakin M.D.   On: 07/19/2020 15:28     ELIGIBLE FOR AVAILABLE RESEARCH PROTOCOL:BCEP   ASSESSMENT: 60 y.o. [place] woman status post right breast upper outer quadrant biopsy 02/25/2018 for a clinical T2 pN1, stage IIB invasive ductal carcinoma, estrogen and progesterone receptor negative, HER-2 amplified, with an MIB-1 of 50%.  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 STK11, TP53, TSC1, TSC2, and VHL.  The following genes were evaluated for sequence changes only: SDHA and HOXB13 c.251G>A variant only.   (2) neoadjuvant chemotherapy consisting of carboplatin, docetaxel, trastuzumab and pertuzumab every 21 days x 6 starting 03/23/2018, completed 07/12/2018  (a) pertuzumab omitted after cycle 1 due to poorly-controlled diarrhea  (b) docetaxel discontinued after cycle 3 due to neuropathy; gemcitabine substituted  (c) Granix substituted for Neulasta on days 3-6 following  chemotherapy due to improved tolerance  (d) Switched back to Neulasta after cycles 5 and 6 per patient request  (3) anti-HER-2 treatment continued for 12 months, last dose 03/31/2019  (a) baseline echocardiogram 03/16/2018 shows an ejection fraction in the 55-60% range.  (b) echo on 11/15 shows EF of 55-60%  (c) echo 02/18/2019 shows EF 55-60%  (4) status post right modified radical mastectomy 08/12/2018 showing a complete pathologic response (ypT0 ypN0)  (a) a total of 11 regional lymph nodes removed  (b) expander in place  (5) adjuvant radiation 11/15/2018-12/17/2018: Right chest wall 50 Gy in 25 fractions; Right CW, SCV,PAB: 50 Gy in 25 fractions  (6) left adrenal adenoma measuring 2.9 cm on CT of the chest 03/16/2018   PLAN: [place] is now 2 years out from definitive surgery for her breast cancer with no evidence of disease recurrence.  This is favorable.  I emphasized that estrogen receptor negative tumors if they are to recur tend to recur early.  Accordingly this is a real milestone for her.  It does give her a better prognosis.  She will be due for her next mammogram early November of this year.  She will see [place] again in mid November with labs same day  I have encouraged her to walk as much as she can and do her stretches at least daily  Total encounter time 25 minutes.Sarajane Jews C. Jacqulene Huntley, MD 08/15/20 3:11 PM Medical Oncology and Hematology [facility] [place], [place] [address] Tel. [phone]    Fax. [phone]   IWilburn Mylar, am acting as scribe for Dr. Sarajane Jews  C. Ceria Suminski.  I, Lurline Del MD, have reviewed the above documentation for accuracy and completeness, and I agree with the above.    *Total Encounter Time as defined by the Centers for Medicare and Medicaid Services includes, in addition to the face-to-face time of a patient visit (documented in the note above) non-face-to-face time: obtaining and reviewing outside  history, ordering and reviewing medications, tests or procedures, care coordination (communications with other health care professionals or caregivers) and documentation in the medical record.

## 2020-08-17 MED FILL — ALPRAZOLAM 1 MG TABS: 1 | 30 days supply | Qty: 105 | Fill #0

## 2020-08-18 MED FILL — ZOLPIDEM TARTRATE 10 MG TAB: 10 | 30 days supply | Qty: 15 | Fill #1

## 2020-08-20 ENCOUNTER — Telehealth: Payer: Self-pay | Admitting: Oncology

## 2020-08-20 NOTE — Telephone Encounter (Signed)
Scheduled appts per 1/5 los. Left voicemail with appt date/time.

## 2020-08-30 ENCOUNTER — Other Ambulatory Visit: Payer: Self-pay | Admitting: Nurse Practitioner

## 2020-08-30 DIAGNOSIS — S55291S Other specified injury of vein at forearm level, right arm, sequela: Secondary | ICD-10-CM

## 2020-08-31 ENCOUNTER — Other Ambulatory Visit: Payer: Self-pay | Admitting: Oncology

## 2020-08-31 ENCOUNTER — Other Ambulatory Visit: Payer: Self-pay | Admitting: *Deleted

## 2020-08-31 MED ORDER — ESOMEPRAZOLE MAGNESIUM 40 MG PO CPDR
40.0000 mg | DELAYED_RELEASE_CAPSULE | Freq: Every day | ORAL | 2 refills | Status: DC
Start: 2020-08-31 — End: 2020-08-31

## 2020-08-31 MED FILL — ESOMEPRAZOLE MAG DR 40 MG C: 40 | 30 days supply | Qty: 30 | Fill #0

## 2020-08-31 NOTE — Progress Notes (Signed)
Virtual Visit via Video Note  I connected with Jacqueline Good on 09/03/20 at  3:10 PM EST by a video enabled telemedicine application and verified that I am speaking with the correct person using two identifiers.  Location: Patient: home Provider: office Persons participated in the visit- patient, provider   I discussed the limitations of evaluation and management by telemedicine and the availability of in person appointments. The patient expressed understanding and agreed to proceed.    I discussed the assessment and treatment plan with the patient. The patient was provided an opportunity to ask questions and all were answered. The patient agreed with the plan and demonstrated an understanding of the instructions.   The patient was advised to call back or seek an in-person evaluation if the symptoms worsen or if the condition fails to improve as anticipated.  I provided 17 minutes of non-face-to-face time during this encounter.   Norman Clay, MD    [facility] MD/PA/NP OP Progress Note  09/03/2020 4:35 PM Jacqueline Good  MRN:  [id number]  Chief Complaint:  Chief Complaint    Follow-up; Depression; Anxiety     HPI:  This is a follow-up appointment for depression, anxiety and insomnia.  She states that she had a "okay "holiday; she visit her mother.  She has been doing okay.  She states that the test came back normal, and she feels grateful about the situation when she was asked about the examination of the right breast.  She got it behind her.  She tries to stay safe for herself and her mother.  She is hoping to get out more frequently when the pandemic is settled.  She tries to do daily duties and watches TV at home.  She continues to have insomnia, although it has been manageable.  She feels depressed.  She reports slight reducing in appetite.  She denies SI.  She takes Xanax for anxiety.  She denies alcohol use or drug use.   She struggles with concentration.  She has had this at  least for 3 years, which is before she had chemotherapy.  She went to some college, and denies any IEP.  When she is offered to do neuropsych testing to delineate the cause of her concentration, she declined this option.   Daily routine:household chores, Employment:used to work as Research scientist (physical sciences), last in 2017; although the patient was terminated, it was a good timing for the patient as she was taking care of her cousin, who later moved into nursing home. Support: Household:by herself. Her mother lives next door Marital status:single Number of children:1 daughter, who lives in [place].She has estranged relationship with the father of her daughter. She grew up in [place].Her parents got divorced when she was 54-year-old.Although she did not like them arguing with each other,she reports good relationship with her mother,who raised the patient and other siblings.  Wt Readings from Last 3 Encounters:  08/15/20 240 lb 6.4 oz (109 kg)  02/09/20 237 lb 4.8 oz (107.6 kg)  01/24/20 237 lb 12.8 oz (107.9 kg)    Visit Diagnosis:    ICD-10-CM   1. MDD (major depressive disorder), recurrent episode, mild (HCC)  F33.0 mirtazapine (REMERON) 15 MG tablet    venlafaxine (EFFEXOR) 75 MG tablet  2. Generalized anxiety disorder  F41.1 mirtazapine (REMERON) 15 MG tablet    venlafaxine (EFFEXOR) 75 MG tablet  3. Insomnia, unspecified type  G47.00 zolpidem (AMBIEN) 10 MG tablet    Past Psychiatric History: Please see initial evaluation for full details.  I have reviewed the history. No updates at this time.     Past Medical History:  Past Medical History:  Diagnosis Date  . Anemia   . Anxiety   . Arthritis    "all over" (08/12/2018)  . Asthma   . Breast cancer, right breast ([place]) dx'd 02/2018  . Chronic bronchitis ([place])   . Depression   . Family history of breast cancer   . Family history of prostate cancer   . Family history of uterine cancer   . GERD (gastroesophageal reflux disease)    . History of blood transfusion    "several; related to low blood" (08/12/2018)  . History of radiation therapy 11/15/18- 12/17/18   Right Chest wall, SCV, PAB. 25 fractions.   . Personal history of chemotherapy   . Personal history of radiation therapy     Past Surgical History:  Procedure Laterality Date  . AXILLARY LYMPH NODE DISSECTION Right 08/12/2018   Procedure: AXILLARY LYMPH NODE DISSECTION;  Surgeon: Alphonsa Overall, MD;  Location: [place];  Service: General;  Laterality: Right;  . BREAST BIOPSY Right 03/2018  . BREAST RECONSTRUCTION WITH PLACEMENT OF TISSUE EXPANDER AND FLEX HD (ACELLULAR HYDRATED DERMIS) Right 08/12/2018   Procedure: RIGHT BREAST RECONSTRUCTION WITH PLACEMENT OF TISSUE EXPANDER AND FLEX HD (ACELLULAR HYDRATED DERMIS);  Surgeon: Wallace Going, DO;  Location: [place];  Service: Plastics;  Laterality: Right;  . BREAST REDUCTION SURGERY Left 09/28/2019   Procedure: LEFT MAMMARY REDUCTION/MASTOPEXY  (BREAST);  Surgeon: Wallace Going, DO;  Location: [place];  Service: Plastics;  Laterality: Left;  . DILATION AND CURETTAGE OF UTERUS    . ENDOMETRIAL ABLATION    . IR IMAGING GUIDED PORT INSERTION  03/18/2018  . MASTECTOMY MODIFIED RADICAL Right 08/12/2018   Procedure: RIGHT MODIFIED RADICAL MASTECTOMY;  Surgeon: Alphonsa Overall, MD;  Location: [place];  Service: General;  Laterality: Right;  . MASTECTOMY MODIFIED RADICAL Right 08/12/2018   w/axillary LND  . MYOMECTOMY    . REDUCTION MAMMAPLASTY Left 2021  . REMOVAL OF TISSUE EXPANDER AND PLACEMENT OF IMPLANT Right 10/26/2018   Procedure: Removal of right breast expander;  Surgeon: Wallace Going, DO;  Location: [place];  Service: Plastics;  Laterality: Right;  75 min, please    Family Psychiatric History: Please see initial evaluation for full details. I have reviewed the history. No updates at this time.     Family History:  Family History  Problem Relation Age of Onset  . Lupus Sister   . Heart  disease Sister   . Breast cancer Mother 59  . Prostate cancer Paternal Uncle   . Diabetes Maternal Grandmother   . Heart Problems Maternal Grandmother   . Prostate cancer Maternal Grandfather   . Prostate cancer Paternal Grandfather   . Prostate cancer Other        MGFs brother  . Breast cancer Other        Mat great-grandmother's sister  . Uterine cancer Maternal Great-grandmother   . Prostate cancer Other        PGFs brother    Social History:  Social History   Socioeconomic History  . Marital status: Single    Spouse name: Not on file  . Number of children: Not on file  . Years of education: Not on file  . Highest education level: Not on file  Occupational History  . Not on file  Tobacco Use  . Smoking status: Former Smoker    Packs/day: 1.00  Years: 38.00    Pack years: 38.00    Types: Cigarettes    Quit date: 05/15/2018    Years since quitting: 2.3  . Smokeless tobacco: Never Used  Vaping Use  . Vaping Use: Never used  Substance and Sexual Activity  . Alcohol use: Yes    Comment: occ  . Drug use: No  . Sexual activity: Not Currently    Birth control/protection: None  Other Topics Concern  . Not on file  Social History Narrative  . Not on file   Social Determinants of Health   Financial Resource Strain: Not on file  Food Insecurity: Not on file  Transportation Needs: Not on file  Physical Activity: Not on file  Stress: Not on file  Social Connections: Not on file    Allergies:  Allergies  Allergen Reactions  . Gadolinium Derivatives Itching and Cough    Pt began sneezing and coughing as soon as MRI contrast was injected. Severe nasal congestion. No rash or hives no SOB.   Marland Kitchen Hydrocodone Hives and Itching    Pt says she can take it with benadryl.   . Iodinated Diagnostic Agents Itching    Metabolic Disorder Labs: Lab Results  Component Value Date   HGBA1C 5.4 04/27/2014   No results found for: PROLACTIN Lab Results  Component Value Date    CHOL 201 (H) 02/18/2019   TRIG 115 02/18/2019   HDL 63 02/18/2019   CHOLHDL 3.2 02/18/2019   VLDL 23 02/18/2019   LDLCALC 115 (H) 02/18/2019   LDLCALC 76 09/24/2016   Lab Results  Component Value Date   TSH 0.58 09/24/2016   TSH 0.696 04/27/2014    Therapeutic Level Labs: No results found for: LITHIUM No results found for: VALPROATE No components found for:  CBMZ  Current Medications: Current Outpatient Medications  Medication Sig Dispense Refill  . [START ON 09/16/2020] ALPRAZolam (XANAX) 1 MG tablet 1 mg three times and 0.5 mg per day as needed for anxiety. No more than 3.5 mg per day 105 tablet 1  . esomeprazole (NEXIUM) 40 MG capsule Take 1 capsule (40 mg total) by mouth daily. 30 capsule 2  . fluticasone (FLONASE) 50 MCG/ACT nasal spray Place 2 sprays into both nostrils daily as needed for up to 14 days for allergies. 1 g 1  . Fluticasone-Salmeterol (ADVAIR DISKUS) 250-50 MCG/DOSE AEPB INHALE 1 PUFF INTO THE LUNGS 2 TIMES DAILY. 60 each 2  . loratadine (CLARITIN) 10 MG tablet Take 1 tablet (10 mg total) by mouth daily as needed for allergies. 30 tablet 11  . lurasidone (LATUDA) 20 MG TABS tablet Take 1 tablet (20 mg total) by mouth daily. 90 tablet 0  . [START ON 09/30/2020] lurasidone (LATUDA) 40 MG TABS tablet Take 1 tablet (40 mg total) by mouth daily with breakfast. 90 tablet 0  . [START ON 09/19/2020] mirtazapine (REMERON) 15 MG tablet Take 1 tablet (15 mg total) by mouth at bedtime. 90 tablet 0  . naproxen (NAPROSYN) 500 MG tablet Take 1 tablet (500 mg total) by mouth 2 (two) times daily with a meal. 60 tablet 2  . omeprazole (PRILOSEC) 40 MG capsule TAKE 1 CAPSULE BY MOUTH DAILY. 30 capsule 2  . propranolol (INDERAL) 40 MG tablet Take 40 mg by mouth daily as needed (heart palpitation.).     Marland Kitchen tiZANidine (ZANAFLEX) 4 MG tablet     . [START ON 09/19/2020] venlafaxine (EFFEXOR) 75 MG tablet Take 3 tablets (225 mg total) by mouth daily. 270 tablet  0  . [START ON 09/16/2020]  zolpidem (AMBIEN) 10 MG tablet Take 1 tablet (10 mg total) by mouth at bedtime as needed for sleep. 30 tablet 1   No current facility-administered medications for this visit.     Musculoskeletal: Strength & Muscle Tone: N/A Gait & Station: N/A Patient leans: N/A  Psychiatric Specialty Exam: Review of Systems  Psychiatric/Behavioral: Positive for decreased concentration, dysphoric mood and sleep disturbance. Negative for agitation, behavioral problems, confusion, hallucinations, self-injury and suicidal ideas. The patient is nervous/anxious. The patient is not hyperactive.   All other systems reviewed and are negative.   There were no vitals taken for this visit.There is no height or weight on file to calculate BMI.  General Appearance: Fairly Groomed  Eye Contact:  Good  Speech:  Clear and Coherent  Volume:  Normal  Mood:  Depressed  Affect:  Appropriate, Congruent and slightly calmer  Thought Process:  Coherent  Orientation:  Full (Time, Place, and Person)  Thought Content: Logical   Suicidal Thoughts:  No  Homicidal Thoughts:  No  Memory:  Immediate;   Good  Judgement:  Good  Insight:  Fair  Psychomotor Activity:  Normal  Concentration:  Concentration: Good and Attention Span: Good  Recall:  Good  Fund of Knowledge: Good  Language: Good  Akathisia:  No  Handed:  Right  AIMS (if indicated): not done  Assets:  Communication Skills Desire for Improvement  ADL's:  Intact  Cognition: WNL  Sleep:  Fair   Screenings: GAD-7   [place] Office Visit from 04/22/2018 in [place] Office Visit from 06/03/2017 in [place] Office Visit from 09/24/2016 in [place] Office Visit from 03/27/2016 in [place]  Total GAD-7 Score 11 3 15 16     PHQ2-9   [place] Office Visit from 04/22/2018 in [place] Office Visit from  06/03/2017 in [place] Office Visit from 09/24/2016 in [place] Office Visit from 05/01/2016 in [place] Office Visit from 03/27/2016 in [place]  PHQ-2 Total Score 5 0 2 5 2   PHQ-9 Total Score 15 4 7 8 10        Assessment and Plan:  Jacqueline Good is a 60 y.o. year old female with a history of  depression,OCD,estrogen negativeT2 pN1, stage IIB invasive ductal carcinoma, diagnosed07/18/2019s/p mastectomy1/2020,neoadjuvantchemo immunotherapy/on trastzumab,asthma, OA, chronic back pain, who presents for follow up appointment for below.   1. MDD (major depressive disorder), recurrent episode, mild ([place]) 2. Generalized anxiety disorder Although she continues to report depressed mood, it has been more manageable since the last visit, which coincided with up titration of Latuda, and recent evaluation of her right breast with no significant concern of breast cancer.  Other psychosocial stressors includes unemployment, chronic pain, and taking care of her mother with medical condition.  Will continue current medication regimen.  Will continue venlafaxine to target depression and anxiety.  Will continue mirtazapine to target depression.  Discussed potential metabolic side effect.  Will continue Latuda as adjunctive treatment for depression.  Discussed potential metabolic side effect and EPS.  Will continue Xanax as needed for anxiety.  She is aware of its risk of dependence and oversedation.    3. Insomnia, unspecified type She has had good benefit from Ambien.  Will continue current dose to  target insomnia.  Although she does have snoring at night, she is not interested in referral for sleep evaluation.   # inattention She complains of worsening in difficulty over the past few years before chemotherapy.  No history of IEP/ADHD.  The etiology is likely multifactorial  given her ongoing mood symptoms.  She is not interested in a neuropsychological evaluation.   Plan I have reviewed and updated plans as below 1. Continue venlafaxine 225 mg daily 2. Continue Latuda40mg , take at night- monitor weight gain 3.Continue mirtazapine15 mg at night  4.Continue Xanax 1 mg three times a day (and additional 0.5 mg daily) as needed for anxiety 5. Continue Ambien 10 mg at night as needed for insomnia 6. Next appointment: 3/21 at 4 PMfor 30 mins, video  I have utilized the [place] Controlled Substances Reporting System (PMP AWARxE) to confirm adherence regarding the patient's medication. My review reveals appropriate prescription fills.   Past trials of medication: sertraline, fluoxetine, lexapro,Buspar,venlafaxine, duloxetine, bupropion, quetiapine, Abilify  The patient demonstrates the following risk factors for suicide: Chronic risk factors for suicide include:psychiatric disorder ofdepressionand medical illness of breast cancer. Acute risk factorsfor suicide include: unemployment and loss (financial, interpersonal, professional). Protective factorsfor this patient include: positive social support, responsibility to others (children, family), coping skills and hope for the future. Considering these factors, the overall suicide risk at this point appears to below. Patientisappropriate for outpatient follow up.  Norman Clay, MD 09/03/2020, 4:35 PM

## 2020-09-03 ENCOUNTER — Other Ambulatory Visit: Payer: Self-pay | Admitting: Psychiatry

## 2020-09-03 ENCOUNTER — Encounter: Payer: Self-pay | Admitting: Psychiatry

## 2020-09-03 ENCOUNTER — Telehealth (INDEPENDENT_AMBULATORY_CARE_PROVIDER_SITE_OTHER): Payer: Medicaid Other | Admitting: Psychiatry

## 2020-09-03 ENCOUNTER — Other Ambulatory Visit: Payer: Self-pay

## 2020-09-03 DIAGNOSIS — F330 Major depressive disorder, recurrent, mild: Secondary | ICD-10-CM

## 2020-09-03 DIAGNOSIS — G4700 Insomnia, unspecified: Secondary | ICD-10-CM

## 2020-09-03 DIAGNOSIS — F411 Generalized anxiety disorder: Secondary | ICD-10-CM | POA: Diagnosis not present

## 2020-09-03 MED ORDER — MIRTAZAPINE 15 MG PO TABS: 15.0000 mg | ORAL_TABLET | Freq: Every day | ORAL | 0 refills | Status: DC

## 2020-09-03 MED ORDER — VENLAFAXINE HCL 75 MG PO TABS: 225.0000 mg | ORAL_TABLET | Freq: Every day | ORAL | 0 refills | Status: DC

## 2020-09-03 MED ORDER — ALPRAZOLAM 1 MG PO TABS
ORAL_TABLET | ORAL | 1 refills | Status: DC
Start: 2020-09-16 — End: 2020-11-07

## 2020-09-03 MED ORDER — ZOLPIDEM TARTRATE 10 MG PO TABS: 10.0000 mg | ORAL_TABLET | Freq: Every evening | ORAL | 1 refills | Status: DC | PRN

## 2020-09-03 MED ORDER — LURASIDONE HCL 40 MG PO TABS
40.0000 mg | ORAL_TABLET | Freq: Every day | ORAL | 0 refills | Status: DC
Start: 2020-09-30 — End: 2020-09-30

## 2020-09-03 NOTE — Patient Instructions (Signed)
1. Continue venlafaxine 225 mg daily 2. Continue Latuda40mg , take at night 3.Continue mirtazapine15 mg at night  4.Continue Xanax 1 mg three times a day (and additional 0.5 mg daily) as needed for anxiety 5. Continue Ambien 10 mg at night as needed for insomnia 6. Next appointment: 3/21 at 4 PM

## 2020-09-15 MED FILL — ALPRAZOLAM 1 MG TABS: 1 | 30 days supply | Qty: 105 | Fill #0

## 2020-09-15 MED FILL — ZOLPIDEM TARTRATE 10 MG TAB: 10 | 15 days supply | Qty: 15 | Fill #0

## 2020-09-15 MED FILL — ZOLPIDEM TARTRATE 10 MG TAB: 10 | 15 days supply | Qty: 15 | Fill #1

## 2020-09-15 MED FILL — VENLAFAXINE HCL 75 MG TAB: 75 | 90 days supply | Qty: 270 | Fill #0

## 2020-09-28 MED FILL — LATUDA 40 MG TABLET: 40 | 30 days supply | Qty: 30 | Fill #0

## 2020-10-12 ENCOUNTER — Other Ambulatory Visit: Payer: Self-pay | Admitting: Adult Health

## 2020-10-12 DIAGNOSIS — Z171 Estrogen receptor negative status [ER-]: Secondary | ICD-10-CM

## 2020-10-12 DIAGNOSIS — C50411 Malignant neoplasm of upper-outer quadrant of right female breast: Secondary | ICD-10-CM

## 2020-10-12 DIAGNOSIS — G609 Hereditary and idiopathic neuropathy, unspecified: Secondary | ICD-10-CM

## 2020-10-12 MED FILL — ESOMEPRAZOLE MAG DR 40 MG C: 40 | 30 days supply | Qty: 30 | Fill #1

## 2020-10-12 MED FILL — ALPRAZOLAM 1 MG TABS: 1 | 30 days supply | Qty: 105 | Fill #1

## 2020-10-12 MED FILL — VENLAFAXINE HCL 75 MG TAB: 75 | 90 days supply | Qty: 270 | Fill #0

## 2020-10-12 MED FILL — ZOLPIDEM TARTRATE 10 MG TAB: 10 | 30 days supply | Qty: 30 | Fill #2

## 2020-10-12 MED FILL — MIRTAZAPINE 15 MG TABS: 15 | 90 days supply | Qty: 90 | Fill #0

## 2020-10-16 ENCOUNTER — Other Ambulatory Visit: Payer: Self-pay

## 2020-10-16 ENCOUNTER — Other Ambulatory Visit: Payer: Self-pay | Admitting: Oncology

## 2020-10-16 ENCOUNTER — Telehealth: Payer: Self-pay

## 2020-10-16 DIAGNOSIS — G609 Hereditary and idiopathic neuropathy, unspecified: Secondary | ICD-10-CM

## 2020-10-16 DIAGNOSIS — C50411 Malignant neoplasm of upper-outer quadrant of right female breast: Secondary | ICD-10-CM

## 2020-10-16 DIAGNOSIS — Z171 Estrogen receptor negative status [ER-]: Secondary | ICD-10-CM

## 2020-10-16 MED ORDER — NAPROXEN 500 MG PO TABS: 500.0000 mg | ORAL_TABLET | Freq: Two times a day (BID) | ORAL | 2 refills | Status: DC

## 2020-10-16 MED FILL — NAPROXEN 500 MG TABS: 500 | 30 days supply | Qty: 60 | Fill #0

## 2020-10-16 NOTE — Telephone Encounter (Signed)
Attempted to return call to pt. Pt requesting med refil but did not say which one. Left message

## 2020-10-23 NOTE — Progress Notes (Deleted)
[place] MD/PA/NP OP Progress Note  10/23/2020 9:13 AM Jacqueline Good  MRN:  [id number]  Chief Complaint:  HPI: *** Visit Diagnosis: No diagnosis found.  Past Psychiatric History: Please see initial evaluation for full details. I have reviewed the history. No updates at this time.     Past Medical History:  Past Medical History:  Diagnosis Date  . Anemia   . Anxiety   . Arthritis    "all over" (08/12/2018)  . Asthma   . Breast cancer, right breast ([place]) dx'd 02/2018  . Chronic bronchitis ([place])   . Depression   . Family history of breast cancer   . Family history of prostate cancer   . Family history of uterine cancer   . GERD (gastroesophageal reflux disease)   . History of blood transfusion    "several; related to low blood" (08/12/2018)  . History of radiation therapy 11/15/18- 12/17/18   Right Chest wall, SCV, PAB. 25 fractions.   . Personal history of chemotherapy   . Personal history of radiation therapy     Past Surgical History:  Procedure Laterality Date  . AXILLARY LYMPH NODE DISSECTION Right 08/12/2018   Procedure: AXILLARY LYMPH NODE DISSECTION;  Surgeon: Alphonsa Overall, MD;  Location: [facility];  Service: General;  Laterality: Right;  . BREAST BIOPSY Right 03/2018  . BREAST RECONSTRUCTION WITH PLACEMENT OF TISSUE EXPANDER AND FLEX HD (ACELLULAR HYDRATED DERMIS) Right 08/12/2018   Procedure: RIGHT BREAST RECONSTRUCTION WITH PLACEMENT OF TISSUE EXPANDER AND FLEX HD (ACELLULAR HYDRATED DERMIS);  Surgeon: Wallace Going, DO;  Location: [facility];  Service: Plastics;  Laterality: Right;  . BREAST REDUCTION SURGERY Left 09/28/2019   Procedure: LEFT MAMMARY REDUCTION/MASTOPEXY  (BREAST);  Surgeon: Wallace Going, DO;  Location: [facility];  Service: Plastics;  Laterality: Left;  . DILATION AND CURETTAGE OF UTERUS    . ENDOMETRIAL ABLATION    . IR IMAGING GUIDED PORT INSERTION  03/18/2018  . MASTECTOMY MODIFIED RADICAL Right 08/12/2018   Procedure: RIGHT MODIFIED RADICAL  MASTECTOMY;  Surgeon: Alphonsa Overall, MD;  Location: [facility];  Service: General;  Laterality: Right;  . MASTECTOMY MODIFIED RADICAL Right 08/12/2018   w/axillary LND  . MYOMECTOMY    . REDUCTION MAMMAPLASTY Left 2021  . REMOVAL OF TISSUE EXPANDER AND PLACEMENT OF IMPLANT Right 10/26/2018   Procedure: Removal of right breast expander;  Surgeon: Wallace Going, DO;  Location: [facility];  Service: Plastics;  Laterality: Right;  75 min, please    Family Psychiatric History: Please see initial evaluation for full details. I have reviewed the history. No updates at this time.     Family History:  Family History  Problem Relation Age of Onset  . Lupus Sister   . Heart disease Sister   . Breast cancer Mother 68  . Prostate cancer Paternal Uncle   . Diabetes Maternal Grandmother   . Heart Problems Maternal Grandmother   . Prostate cancer Maternal Grandfather   . Prostate cancer Paternal Grandfather   . Prostate cancer Other        MGFs brother  . Breast cancer Other        Mat great-grandmother's sister  . Uterine cancer Maternal Great-grandmother   . Prostate cancer Other        PGFs brother    Social History:  Social History   Socioeconomic History  . Marital status: Single    Spouse name: Not on file  . Number of children: Not on file  .  Years of education: Not on file  . Highest education level: Not on file  Occupational History  . Not on file  Tobacco Use  . Smoking status: Former Smoker    Packs/day: 1.00    Years: 38.00    Pack years: 38.00    Types: Cigarettes    Quit date: 05/15/2018    Years since quitting: 2.4  . Smokeless tobacco: Never Used  Vaping Use  . Vaping Use: Never used  Substance and Sexual Activity  . Alcohol use: Yes    Comment: occ  . Drug use: No  . Sexual activity: Not Currently    Birth control/protection: None  Other Topics Concern  . Not on file  Social History Narrative  . Not on file   Social Determinants of Health   Financial  Resource Strain: Not on file  Food Insecurity: Not on file  Transportation Needs: Not on file  Physical Activity: Not on file  Stress: Not on file  Social Connections: Not on file    Allergies:  Allergies  Allergen Reactions  . Gadolinium Derivatives Itching and Cough    Pt began sneezing and coughing as soon as MRI contrast was injected. Severe nasal congestion. No rash or hives no SOB.   Marland Kitchen Hydrocodone Hives and Itching    Pt says she can take it with benadryl.   . Iodinated Diagnostic Agents Itching    Metabolic Disorder Labs: Lab Results  Component Value Date   HGBA1C 5.4 04/27/2014   No results found for: PROLACTIN Lab Results  Component Value Date   CHOL 201 (H) 02/18/2019   TRIG 115 02/18/2019   HDL 63 02/18/2019   CHOLHDL 3.2 02/18/2019   VLDL 23 02/18/2019   LDLCALC 115 (H) 02/18/2019   LDLCALC 76 09/24/2016   Lab Results  Component Value Date   TSH 0.58 09/24/2016   TSH 0.696 04/27/2014    Therapeutic Level Labs: No results found for: LITHIUM No results found for: VALPROATE No components found for:  CBMZ  Current Medications: Current Outpatient Medications  Medication Sig Dispense Refill  . ALPRAZolam (XANAX) 1 MG tablet 1 mg three times and 0.5 mg per day as needed for anxiety. No more than 3.5 mg per day 105 tablet 1  . esomeprazole (NEXIUM) 40 MG capsule Take 1 capsule (40 mg total) by mouth daily. 30 capsule 2  . fluticasone (FLONASE) 50 MCG/ACT nasal spray Place 2 sprays into both nostrils daily as needed for up to 14 days for allergies. 1 g 1  . Fluticasone-Salmeterol (ADVAIR DISKUS) 250-50 MCG/DOSE AEPB INHALE 1 PUFF INTO THE LUNGS 2 TIMES DAILY. 60 each 2  . loratadine (CLARITIN) 10 MG tablet Take 1 tablet (10 mg total) by mouth daily as needed for allergies. 30 tablet 11  . lurasidone (LATUDA) 20 MG TABS tablet Take 1 tablet (20 mg total) by mouth daily. 90 tablet 0  . lurasidone (LATUDA) 40 MG TABS tablet Take 1 tablet (40 mg total) by mouth  daily with breakfast. 90 tablet 0  . mirtazapine (REMERON) 15 MG tablet Take 1 tablet (15 mg total) by mouth at bedtime. 90 tablet 0  . naproxen (NAPROSYN) 500 MG tablet Take 1 tablet (500 mg total) by mouth 2 (two) times daily with a meal. 60 tablet 2  . omeprazole (PRILOSEC) 40 MG capsule TAKE 1 CAPSULE BY MOUTH DAILY. 30 capsule 2  . propranolol (INDERAL) 40 MG tablet Take 40 mg by mouth daily as needed (heart palpitation.).     Marland Kitchen  tiZANidine (ZANAFLEX) 4 MG tablet     . venlafaxine (EFFEXOR) 75 MG tablet Take 3 tablets (225 mg total) by mouth daily. 270 tablet 0  . zolpidem (AMBIEN) 10 MG tablet Take 1 tablet (10 mg total) by mouth at bedtime as needed for sleep. 30 tablet 1   No current facility-administered medications for this visit.     Musculoskeletal: Strength & Muscle Tone: N/A Gait & Station: N/A Patient leans: N/A  Psychiatric Specialty Exam: Review of Systems  There were no vitals taken for this visit.There is no height or weight on file to calculate BMI.  General Appearance: {Appearance:22683}  Eye Contact:  {BHH EYE CONTACT:22684}  Speech:  Clear and Coherent  Volume:  Normal  Mood:  {BHH MOOD:22306}  Affect:  {Affect (PAA):22687}  Thought Process:  Coherent  Orientation:  Full (Time, Place, and Person)  Thought Content: Logical   Suicidal Thoughts:  {ST/HT (PAA):22692}  Homicidal Thoughts:  {ST/HT (PAA):22692}  Memory:  Immediate;   Good  Judgement:  {Judgement (PAA):22694}  Insight:  {Insight (PAA):22695}  Psychomotor Activity:  Normal  Concentration:  Concentration: Good and Attention Span: Good  Recall:  Good  Fund of Knowledge: Good  Language: Good  Akathisia:  No  Handed:  Right  AIMS (if indicated): not done  Assets:  Communication Skills Desire for Improvement  ADL's:  Intact  Cognition: WNL  Sleep:  {BHH GOOD/FAIR/POOR:22877}   Screenings: GAD-7   Flowsheet Row Office Visit from 04/22/2018 in [facility] Office  Visit from 06/03/2017 in [facility] Office Visit from 09/24/2016 in [facility] Office Visit from 03/27/2016 in [facility]  Total GAD-7 Score 11 3 15 16     PHQ2-9   [place] Office Visit from 04/22/2018 in [facility] Office Visit from 06/03/2017 in [facility] Office Visit from 09/24/2016 in [facility] Office Visit from 05/01/2016 in [facility] Office Visit from 03/27/2016 in [facility]  PHQ-2 Total Score 5 0 2 5 2   PHQ-9 Total Score 15 4 7 8 10        Assessment and Plan:  Jacqueline Good is a 60 y.o. year old female with a history of  depression,OCD,estrogen negativeT2 pN1, stage IIB invasive ductal carcinoma, diagnosed07/18/2019s/p mastectomy1/2020,neoadjuvantchemo immunotherapy/on trastzumab,asthma, OA, chronic back pain,, who presents for follow up appointment for below.    1. MDD (major depressive disorder), recurrent episode, mild ([place]) 2. Generalized anxiety disorder Although she continues to report depressed mood, it has been more manageable since the last visit, which coincided with up titration of Latuda, and recent evaluation of her right breast with no significant concern of breast cancer.  Other psychosocial stressors includes unemployment, chronic pain, and taking care of her mother with medical condition.  Will continue current medication regimen.  Will continue venlafaxine to target depression and anxiety.  Will continue mirtazapine to target depression.  Discussed potential metabolic side effect.  Will continue Latuda as adjunctive treatment for depression.  Discussed potential metabolic side effect and EPS.  Will continue Xanax as needed for anxiety.  She is aware of its risk of dependence and oversedation.    3. Insomnia,  unspecified type She has had good benefit from Ambien.  Will continue current dose to target insomnia. Although she does have snoring at night,she is not interested in  referral for sleep evaluation.  # inattention She complains of worsening in difficulty over the past few years before chemotherapy.  No history of IEP/ADHD.  The etiology is likely multifactorial given her ongoing mood symptoms.  She is not interested in a neuropsychological evaluation.   Plan  1. Continue venlafaxine 225 mg daily 2. Continue Latuda40mg , take at night- monitor weight gain 3.Continue mirtazapine15 mg at night  4.Continue Xanax 1 mg three times a day (and additional 0.5 mg daily) as needed for anxiety 5. Continue Ambien 10 mg at night as needed for insomnia 6. Next appointment: 3/21 at 4 PMfor 30 mins, video  Past trials of medication: sertraline, fluoxetine, lexapro,Buspar,venlafaxine, duloxetine, bupropion, quetiapine, Abilify  The patient demonstrates the following risk factors for suicide: Chronic risk factors for suicide include:psychiatric disorder ofdepressionand medical illness of breast cancer. Acute risk factorsfor suicide include: unemployment and loss (financial, interpersonal, professional). Protective factorsfor this patient include: positive social support, responsibility to others (children, family), coping skills and hope for the future. Considering these factors, the overall suicide risk at this point appears to below. Patientisappropriate for outpatient follow up.   Norman Clay, MD 10/23/2020, 9:13 AM

## 2020-10-29 ENCOUNTER — Telehealth: Payer: Self-pay | Admitting: Psychiatry

## 2020-10-29 ENCOUNTER — Telehealth: Payer: Medicare Other | Admitting: Psychiatry

## 2020-10-29 ENCOUNTER — Other Ambulatory Visit: Payer: Self-pay

## 2020-10-29 NOTE — Telephone Encounter (Signed)
Sent link for video visit through Epic. Patient did not sign in. Called the patient  for appointment scheduled today. The patient did not answer the phone. Left voice message to contact the office.  

## 2020-10-31 NOTE — Progress Notes (Deleted)
[facility] MD/PA/NP OP Progress Note  10/31/2020 5:02 PM Jacqueline Good  MRN:  [id number]  Chief Complaint:  HPI: *** Visit Diagnosis: No diagnosis found.  Past Psychiatric History: Please see initial evaluation for full details. I have reviewed the history. No updates at this time.     Past Medical History:  Past Medical History:  Diagnosis Date  . Anemia   . Anxiety   . Arthritis    "all over" (08/12/2018)  . Asthma   . Breast cancer, right breast ([place]) dx'd 02/2018  . Chronic bronchitis ([place])   . Depression   . Family history of breast cancer   . Family history of prostate cancer   . Family history of uterine cancer   . GERD (gastroesophageal reflux disease)   . History of blood transfusion    "several; related to low blood" (08/12/2018)  . History of radiation therapy 11/15/18- 12/17/18   Right Chest wall, SCV, PAB. 25 fractions.   . Personal history of chemotherapy   . Personal history of radiation therapy     Past Surgical History:  Procedure Laterality Date  . AXILLARY LYMPH NODE DISSECTION Right 08/12/2018   Procedure: AXILLARY LYMPH NODE DISSECTION;  Surgeon: Alphonsa Overall, MD;  Location: [facility];  Service: General;  Laterality: Right;  . BREAST BIOPSY Right 03/2018  . BREAST RECONSTRUCTION WITH PLACEMENT OF TISSUE EXPANDER AND FLEX HD (ACELLULAR HYDRATED DERMIS) Right 08/12/2018   Procedure: RIGHT BREAST RECONSTRUCTION WITH PLACEMENT OF TISSUE EXPANDER AND FLEX HD (ACELLULAR HYDRATED DERMIS);  Surgeon: Wallace Going, DO;  Location: [facility];  Service: Plastics;  Laterality: Right;  . BREAST REDUCTION SURGERY Left 09/28/2019   Procedure: LEFT MAMMARY REDUCTION/MASTOPEXY  (BREAST);  Surgeon: Wallace Going, DO;  Location: [facility];  Service: Plastics;  Laterality: Left;  . DILATION AND CURETTAGE OF UTERUS    . ENDOMETRIAL ABLATION    . IR IMAGING GUIDED PORT INSERTION  03/18/2018  . MASTECTOMY MODIFIED RADICAL Right 08/12/2018   Procedure: RIGHT MODIFIED RADICAL  MASTECTOMY;  Surgeon: Alphonsa Overall, MD;  Location: [facility];  Service: General;  Laterality: Right;  . MASTECTOMY MODIFIED RADICAL Right 08/12/2018   w/axillary LND  . MYOMECTOMY    . REDUCTION MAMMAPLASTY Left 2021  . REMOVAL OF TISSUE EXPANDER AND PLACEMENT OF IMPLANT Right 10/26/2018   Procedure: Removal of right breast expander;  Surgeon: Wallace Going, DO;  Location: [facility];  Service: Plastics;  Laterality: Right;  75 min, please    Family Psychiatric History: Please see initial evaluation for full details. I have reviewed the history. No updates at this time.     Family History:  Family History  Problem Relation Age of Onset  . Lupus Sister   . Heart disease Sister   . Breast cancer Mother 45  . Prostate cancer Paternal Uncle   . Diabetes Maternal Grandmother   . Heart Problems Maternal Grandmother   . Prostate cancer Maternal Grandfather   . Prostate cancer Paternal Grandfather   . Prostate cancer Other        MGFs brother  . Breast cancer Other        Mat great-grandmother's sister  . Uterine cancer Maternal Great-grandmother   . Prostate cancer Other        PGFs brother    Social History:  Social History   Socioeconomic History  . Marital status: Single    Spouse name: Not on file  . Number of children: Not on file  .  Years of education: Not on file  . Highest education level: Not on file  Occupational History  . Not on file  Tobacco Use  . Smoking status: Former Smoker    Packs/day: 1.00    Years: 38.00    Pack years: 38.00    Types: Cigarettes    Quit date: 05/15/2018    Years since quitting: 2.4  . Smokeless tobacco: Never Used  Vaping Use  . Vaping Use: Never used  Substance and Sexual Activity  . Alcohol use: Yes    Comment: occ  . Drug use: No  . Sexual activity: Not Currently    Birth control/protection: None  Other Topics Concern  . Not on file  Social History Narrative  . Not on file   Social Determinants of Health   Financial  Resource Strain: Not on file  Food Insecurity: Not on file  Transportation Needs: Not on file  Physical Activity: Not on file  Stress: Not on file  Social Connections: Not on file    Allergies:  Allergies  Allergen Reactions  . Gadolinium Derivatives Itching and Cough    Pt began sneezing and coughing as soon as MRI contrast was injected. Severe nasal congestion. No rash or hives no SOB.   Marland Kitchen Hydrocodone Hives and Itching    Pt says she can take it with benadryl.   . Iodinated Diagnostic Agents Itching    Metabolic Disorder Labs: Lab Results  Component Value Date   HGBA1C 5.4 04/27/2014   No results found for: PROLACTIN Lab Results  Component Value Date   CHOL 201 (H) 02/18/2019   TRIG 115 02/18/2019   HDL 63 02/18/2019   CHOLHDL 3.2 02/18/2019   VLDL 23 02/18/2019   LDLCALC 115 (H) 02/18/2019   LDLCALC 76 09/24/2016   Lab Results  Component Value Date   TSH 0.58 09/24/2016   TSH 0.696 04/27/2014    Therapeutic Level Labs: No results found for: LITHIUM No results found for: VALPROATE No components found for:  CBMZ  Current Medications: Current Outpatient Medications  Medication Sig Dispense Refill  . ALPRAZolam (XANAX) 1 MG tablet 1 mg three times and 0.5 mg per day as needed for anxiety. No more than 3.5 mg per day 105 tablet 1  . esomeprazole (NEXIUM) 40 MG capsule Take 1 capsule (40 mg total) by mouth daily. 30 capsule 2  . fluticasone (FLONASE) 50 MCG/ACT nasal spray Place 2 sprays into both nostrils daily as needed for up to 14 days for allergies. 1 g 1  . Fluticasone-Salmeterol (ADVAIR DISKUS) 250-50 MCG/DOSE AEPB INHALE 1 PUFF INTO THE LUNGS 2 TIMES DAILY. 60 each 2  . loratadine (CLARITIN) 10 MG tablet Take 1 tablet (10 mg total) by mouth daily as needed for allergies. 30 tablet 11  . lurasidone (LATUDA) 20 MG TABS tablet Take 1 tablet (20 mg total) by mouth daily. 90 tablet 0  . lurasidone (LATUDA) 40 MG TABS tablet Take 1 tablet (40 mg total) by mouth  daily with breakfast. 90 tablet 0  . mirtazapine (REMERON) 15 MG tablet Take 1 tablet (15 mg total) by mouth at bedtime. 90 tablet 0  . naproxen (NAPROSYN) 500 MG tablet Take 1 tablet (500 mg total) by mouth 2 (two) times daily with a meal. 60 tablet 2  . omeprazole (PRILOSEC) 40 MG capsule TAKE 1 CAPSULE BY MOUTH DAILY. 30 capsule 2  . propranolol (INDERAL) 40 MG tablet Take 40 mg by mouth daily as needed (heart palpitation.).     Marland Kitchen  tiZANidine (ZANAFLEX) 4 MG tablet     . venlafaxine (EFFEXOR) 75 MG tablet Take 3 tablets (225 mg total) by mouth daily. 270 tablet 0  . zolpidem (AMBIEN) 10 MG tablet Take 1 tablet (10 mg total) by mouth at bedtime as needed for sleep. 30 tablet 1   No current facility-administered medications for this visit.     Musculoskeletal: Strength & Muscle Tone: N/A Gait & Station: N/A Patient leans: N/A  Psychiatric Specialty Exam: Review of Systems  There were no vitals taken for this visit.There is no height or weight on file to calculate BMI.  General Appearance: {Appearance:22683}  Eye Contact:  {BHH EYE CONTACT:22684}  Speech:  Clear and Coherent  Volume:  Normal  Mood:  {BHH MOOD:22306}  Affect:  {Affect (PAA):22687}  Thought Process:  Coherent  Orientation:  Full (Time, Place, and Person)  Thought Content: Logical   Suicidal Thoughts:  {ST/HT (PAA):22692}  Homicidal Thoughts:  {ST/HT (PAA):22692}  Memory:  Immediate;   Good  Judgement:  {Judgement (PAA):22694}  Insight:  {Insight (PAA):22695}  Psychomotor Activity:  Normal  Concentration:  Concentration: Good and Attention Span: Good  Recall:  Good  Fund of Knowledge: Good  Language: Good  Akathisia:  No  Handed:  Right  AIMS (if indicated): not done  Assets:  Communication Skills Desire for Improvement  ADL's:  Intact  Cognition: WNL  Sleep:  {BHH GOOD/FAIR/POOR:22877}   Screenings: GAD-7   Flowsheet Row Office Visit from 04/22/2018 in [facility]  Visit from 06/03/2017 in [facility] Office Visit from 09/24/2016 in [facility] Office Visit from 03/27/2016 in [facility]  Total GAD-7 Score 11 3 15 16     PHQ2-9   [facility] Office Visit from 04/22/2018 in [facility] Office Visit from 06/03/2017 in [facility] Office Visit from 09/24/2016 in [facility] Office Visit from 05/01/2016 in [facility] Office Visit from 03/27/2016 in [facility]  PHQ-2 Total Score 5 0 2 5 2   PHQ-9 Total Score 15 4 7 8 10        Assessment and Plan:  Jacqueline Good is a 59 y.o. year old female with a history of depression,OCD,estrogen negativeT2 pN1, stage IIB invasive ductal carcinoma, diagnosed07/18/2019s/p mastectomy1/2020,neoadjuvantchemo immunotherapy/on trastzumab,asthma, OA, chronic back pain, who presents for follow up appointment for below.    1. MDD (major depressive disorder), recurrent episode, mild ([place]) 2. Generalized anxiety disorder Although she continues to report depressed mood, it has been more manageable since the last visit, which coincided with up titration of Latuda, and recent evaluation of her right breast with no significant concern of breast cancer.  Other psychosocial stressors includes unemployment, chronic pain, and taking care of her mother with medical condition.  Will continue current medication regimen.  Will continue venlafaxine to target depression and anxiety.  Will continue mirtazapine to target depression.  Discussed potential metabolic side effect.  Will continue Latuda as adjunctive treatment for depression.  Discussed potential metabolic side effect and EPS.  Will continue Xanax as needed for anxiety.  She is aware of its risk of dependence and oversedation.    3. Insomnia,  unspecified type She has had good benefit from Ambien.  Will continue current dose to target insomnia. Although she does have snoring at night,she is not interested in referral  for sleep evaluation.  # inattention She complains of worsening in difficulty over the past few years before chemotherapy.  No history of IEP/ADHD.  The etiology is likely multifactorial given her ongoing mood symptoms.  She is not interested in a neuropsychological evaluation.   Plan  1. Continue venlafaxine 225 mg daily 2. Continue Latuda40mg , take at night- monitor weight gain 3.Continue mirtazapine15 mg at night  4.Continue Xanax 1 mg three times a day (and additional 0.5 mg daily) as needed for anxiety 5. Continue Ambien 10 mg at night as needed for insomnia 6. Next appointment: 3/21 at 4 PMfor 30 mins, video  Past trials of medication: sertraline, fluoxetine, lexapro,Buspar,venlafaxine, duloxetine, bupropion, quetiapine, Abilify  The patient demonstrates the following risk factors for suicide: Chronic risk factors for suicide include:psychiatric disorder ofdepressionand medical illness of breast cancer. Acute risk factorsfor suicide include: unemployment and loss (financial, interpersonal, professional). Protective factorsfor this patient include: positive social support, responsibility to others (children, family), coping skills and hope for the future. Considering these factors, the overall suicide risk at this point appears to below. Patientisappropriate for outpatient follow up.   Norman Clay, MD 10/31/2020, 5:02 PM

## 2020-11-05 ENCOUNTER — Other Ambulatory Visit (HOSPITAL_COMMUNITY): Payer: Self-pay | Admitting: Physician Assistant

## 2020-11-05 MED FILL — predniSONE 5 MG (21) TBPK: 5 | 6 days supply | Qty: 21 | Fill #0

## 2020-11-05 MED FILL — MELOXICAM 15 MG TABLET: 15 | 30 days supply | Qty: 30 | Fill #0

## 2020-11-06 ENCOUNTER — Other Ambulatory Visit: Payer: Self-pay | Admitting: Physician Assistant

## 2020-11-06 ENCOUNTER — Telehealth: Payer: Medicare Other | Admitting: Psychiatry

## 2020-11-06 ENCOUNTER — Other Ambulatory Visit: Payer: Self-pay

## 2020-11-06 ENCOUNTER — Telehealth: Payer: Self-pay | Admitting: Psychiatry

## 2020-11-06 DIAGNOSIS — R1310 Dysphagia, unspecified: Secondary | ICD-10-CM

## 2020-11-06 NOTE — Telephone Encounter (Signed)
Sent link for video visit through Epic. Patient did not sign in. Called the patient for appointment scheduled today. The patient did not answer the phone. No option to leave a voice message.  

## 2020-11-07 ENCOUNTER — Telehealth (INDEPENDENT_AMBULATORY_CARE_PROVIDER_SITE_OTHER): Payer: Medicare Other | Admitting: Psychiatry

## 2020-11-07 ENCOUNTER — Other Ambulatory Visit: Payer: Self-pay | Admitting: Psychiatry

## 2020-11-07 ENCOUNTER — Encounter: Payer: Self-pay | Admitting: Psychiatry

## 2020-11-07 DIAGNOSIS — F330 Major depressive disorder, recurrent, mild: Secondary | ICD-10-CM

## 2020-11-07 DIAGNOSIS — G4700 Insomnia, unspecified: Secondary | ICD-10-CM | POA: Diagnosis not present

## 2020-11-07 DIAGNOSIS — F411 Generalized anxiety disorder: Secondary | ICD-10-CM | POA: Diagnosis not present

## 2020-11-07 MED ORDER — MIRTAZAPINE 15 MG PO TABS: 15.0000 mg | ORAL_TABLET | Freq: Every day | ORAL | 0 refills | Status: DC

## 2020-11-07 MED ORDER — ALPRAZOLAM 1 MG PO TABS
ORAL_TABLET | ORAL | 1 refills | Status: DC
Start: 2020-11-12 — End: 2020-11-10

## 2020-11-07 MED ORDER — VENLAFAXINE HCL 75 MG PO TABS: 225.0000 mg | ORAL_TABLET | Freq: Every day | ORAL | 0 refills | Status: DC

## 2020-11-07 MED ORDER — ZOLPIDEM TARTRATE 10 MG PO TABS: 10.0000 mg | ORAL_TABLET | Freq: Every evening | ORAL | 1 refills | Status: DC | PRN

## 2020-11-07 NOTE — Progress Notes (Signed)
Virtual Visit via Video Note  I connected with Jacqueline Good on 11/07/20 at  3:30 PM EDT by a video enabled telemedicine application and verified that I am speaking with the correct person using two identifiers.  Location: Patient: home Provider: office Persons participated in the visit- patient, provider   I discussed the limitations of evaluation and management by telemedicine and the availability of in person appointments. The patient expressed understanding and agreed to proceed.   I discussed the assessment and treatment plan with the patient. The patient was provided an opportunity to ask questions and all were answered. The patient agreed with the plan and demonstrated an understanding of the instructions.   The patient was advised to call back or seek an in-person evaluation if the symptoms worsen or if the condition fails to improve as anticipated.  I provided 20 minutes of non-face-to-face time during this encounter.   Norman Clay, MD    [facility] MD/PA/NP OP Progress Note  11/07/2020 4:03 PM Jacqueline Good  MRN:  [id number]  Chief Complaint:  Chief Complaint    Follow-up; Depression     HPI:  This is a follow-up appointment for depression, anxiety and insomnia.  She states that she had good time, going out for dinner with her siblings and her mother for her mother's 72 birthday.  She was happy that her mother was able to have another birthday.  She has been secluding herself otherwise.  She tends to stay at home.  Although she does feel close to her mother, her mother would not go outside.  Although she cannot think of anything she may be able to enjoy, she agrees that she listens to music.  She has worsening in back pain, and is planning to see her orthopedic doctor.  She had a panic attack when she heard a loud noise.  She has fair sleep.  She has depressive symptoms as in PHQ-9.  She denies SI.  She denies change in weight or appetite.  She feels comfortable to stay on the  current medication regimen.   Daily routine:household chores, Employment:used to work as Research scientist (physical sciences), last in 2017; although the patient was terminated, it was a good timing for the patient as she was taking care of her cousin, who later moved into nursing home. Support: Household:by herself. Her mother lives next door Marital status:single Number of children:1 daughter, who lives in [place].She has estranged relationship with the father of her daughter. She grew up in [place].Her parents got divorced when she was 35-year-old.Although she did not like them arguing with each other,she reports good relationship with her mother,who raised the patient and other siblings.  Visit Diagnosis:    ICD-10-CM   1. MDD (major depressive disorder), recurrent episode, mild (HCC)  F33.0 mirtazapine (REMERON) 15 MG tablet    venlafaxine (EFFEXOR) 75 MG tablet  2. Generalized anxiety disorder  F41.1 mirtazapine (REMERON) 15 MG tablet    venlafaxine (EFFEXOR) 75 MG tablet  3. Insomnia, unspecified type  G47.00 zolpidem (AMBIEN) 10 MG tablet    Past Psychiatric History: Please see initial evaluation for full details. I have reviewed the history. No updates at this time.     Past Medical History:  Past Medical History:  Diagnosis Date  . Anemia   . Anxiety   . Arthritis    "all over" (08/12/2018)  . Asthma   . Breast cancer, right breast ([place]) dx'd 02/2018  . Chronic bronchitis ([place])   . Depression   . Family history  of breast cancer   . Family history of prostate cancer   . Family history of uterine cancer   . GERD (gastroesophageal reflux disease)   . History of blood transfusion    "several; related to low blood" (08/12/2018)  . History of radiation therapy 11/15/18- 12/17/18   Right Chest wall, SCV, PAB. 25 fractions.   . Personal history of chemotherapy   . Personal history of radiation therapy     Past Surgical History:  Procedure Laterality Date  . AXILLARY LYMPH NODE DISSECTION  Right 08/12/2018   Procedure: AXILLARY LYMPH NODE DISSECTION;  Surgeon: Alphonsa Overall, MD;  Location: [place];  Service: General;  Laterality: Right;  . BREAST BIOPSY Right 03/2018  . BREAST RECONSTRUCTION WITH PLACEMENT OF TISSUE EXPANDER AND FLEX HD (ACELLULAR HYDRATED DERMIS) Right 08/12/2018   Procedure: RIGHT BREAST RECONSTRUCTION WITH PLACEMENT OF TISSUE EXPANDER AND FLEX HD (ACELLULAR HYDRATED DERMIS);  Surgeon: Wallace Going, DO;  Location: [facility];  Service: Plastics;  Laterality: Right;  . BREAST REDUCTION SURGERY Left 09/28/2019   Procedure: LEFT MAMMARY REDUCTION/MASTOPEXY  (BREAST);  Surgeon: Wallace Going, DO;  Location: [facility];  Service: Plastics;  Laterality: Left;  . DILATION AND CURETTAGE OF UTERUS    . ENDOMETRIAL ABLATION    . IR IMAGING GUIDED PORT INSERTION  03/18/2018  . MASTECTOMY MODIFIED RADICAL Right 08/12/2018   Procedure: RIGHT MODIFIED RADICAL MASTECTOMY;  Surgeon: Alphonsa Overall, MD;  Location: [place];  Service: General;  Laterality: Right;  . MASTECTOMY MODIFIED RADICAL Right 08/12/2018   w/axillary LND  . MYOMECTOMY    . REDUCTION MAMMAPLASTY Left 2021  . REMOVAL OF TISSUE EXPANDER AND PLACEMENT OF IMPLANT Right 10/26/2018   Procedure: Removal of right breast expander;  Surgeon: Wallace Going, DO;  Location: [facility];  Service: Plastics;  Laterality: Right;  75 min, please    Family Psychiatric History: Please see initial evaluation for full details. I have reviewed the history. No updates at this time.     Family History:  Family History  Problem Relation Age of Onset  . Lupus Sister   . Heart disease Sister   . Breast cancer Mother 16  . Prostate cancer Paternal Uncle   . Diabetes Maternal Grandmother   . Heart Problems Maternal Grandmother   . Prostate cancer Maternal Grandfather   . Prostate cancer Paternal Grandfather   . Prostate cancer Other        MGFs brother  . Breast cancer Other        Mat great-grandmother's  sister  . Uterine cancer Maternal Great-grandmother   . Prostate cancer Other        PGFs brother    Social History:  Social History   Socioeconomic History  . Marital status: Single    Spouse name: Not on file  . Number of children: Not on file  . Years of education: Not on file  . Highest education level: Not on file  Occupational History  . Not on file  Tobacco Use  . Smoking status: Former Smoker    Packs/day: 1.00    Years: 38.00    Pack years: 38.00    Types: Cigarettes    Quit date: 05/15/2018    Years since quitting: 2.4  . Smokeless tobacco: Never Used  Vaping Use  . Vaping Use: Never used  Substance and Sexual Activity  . Alcohol use: Yes    Comment: occ  . Drug use: No  . Sexual activity: Not  Currently    Birth control/protection: None  Other Topics Concern  . Not on file  Social History Narrative  . Not on file   Social Determinants of Health   Financial Resource Strain: Not on file  Food Insecurity: Not on file  Transportation Needs: Not on file  Physical Activity: Not on file  Stress: Not on file  Social Connections: Not on file    Allergies:  Allergies  Allergen Reactions  . Gadolinium Derivatives Itching and Cough    Pt began sneezing and coughing as soon as MRI contrast was injected. Severe nasal congestion. No rash or hives no SOB.   Marland Kitchen Hydrocodone Hives and Itching    Pt says she can take it with benadryl.   . Iodinated Diagnostic Agents Itching    Metabolic Disorder Labs: Lab Results  Component Value Date   HGBA1C 5.4 04/27/2014   No results found for: PROLACTIN Lab Results  Component Value Date   CHOL 201 (H) 02/18/2019   TRIG 115 02/18/2019   HDL 63 02/18/2019   CHOLHDL 3.2 02/18/2019   VLDL 23 02/18/2019   LDLCALC 115 (H) 02/18/2019   LDLCALC 76 09/24/2016   Lab Results  Component Value Date   TSH 0.58 09/24/2016   TSH 0.696 04/27/2014    Therapeutic Level Labs: No results found for: LITHIUM No results found for:  VALPROATE No components found for:  CBMZ  Current Medications: Current Outpatient Medications  Medication Sig Dispense Refill  . predniSONE (DELTASONE) 5 MG tablet Take 5 mg by mouth daily with breakfast.    . Derrill Memo ON 11/12/2020] ALPRAZolam (XANAX) 1 MG tablet 1 mg three times and 0.5 mg per day as needed for anxiety. No more than 3.5 mg per day 105 tablet 1  . esomeprazole (NEXIUM) 40 MG capsule Take 1 capsule (40 mg total) by mouth daily. 30 capsule 2  . fluticasone (FLONASE) 50 MCG/ACT nasal spray Place 2 sprays into both nostrils daily as needed for up to 14 days for allergies. 1 g 1  . Fluticasone-Salmeterol (ADVAIR DISKUS) 250-50 MCG/DOSE AEPB INHALE 1 PUFF INTO THE LUNGS 2 TIMES DAILY. 60 each 2  . loratadine (CLARITIN) 10 MG tablet Take 1 tablet (10 mg total) by mouth daily as needed for allergies. 30 tablet 11  . lurasidone (LATUDA) 40 MG TABS tablet Take 1 tablet (40 mg total) by mouth daily with breakfast. 90 tablet 0  . [START ON 12/17/2020] mirtazapine (REMERON) 15 MG tablet Take 1 tablet (15 mg total) by mouth at bedtime. 90 tablet 0  . naproxen (NAPROSYN) 500 MG tablet Take 1 tablet (500 mg total) by mouth 2 (two) times daily with a meal. 60 tablet 2  . omeprazole (PRILOSEC) 40 MG capsule TAKE 1 CAPSULE BY MOUTH DAILY. 30 capsule 2  . propranolol (INDERAL) 40 MG tablet Take 40 mg by mouth daily as needed (heart palpitation.).     Marland Kitchen tiZANidine (ZANAFLEX) 4 MG tablet     . [START ON 12/17/2020] venlafaxine (EFFEXOR) 75 MG tablet Take 3 tablets (225 mg total) by mouth daily. 270 tablet 0  . [START ON 11/12/2020] zolpidem (AMBIEN) 10 MG tablet Take 1 tablet (10 mg total) by mouth at bedtime as needed for sleep. 30 tablet 1   No current facility-administered medications for this visit.     Musculoskeletal: Strength & Muscle Tone: N/A Gait & Station: N/A Patient leans: N/A  Psychiatric Specialty Exam: Review of Systems  Psychiatric/Behavioral: Positive for decreased concentration,  dysphoric mood and sleep  disturbance. Negative for agitation, behavioral problems, confusion, hallucinations, self-injury and suicidal ideas. The patient is nervous/anxious. The patient is not hyperactive.   All other systems reviewed and are negative.   There were no vitals taken for this visit.There is no height or weight on file to calculate BMI.  General Appearance: Fairly Groomed  Eye Contact:  Good  Speech:  Clear and Coherent  Volume:  Normal  Mood:  fine  Affect:  Appropriate, Congruent and down at times  Thought Process:  Coherent and Goal Directed  Orientation:  Full (Time, Place, and Person)  Thought Content: Logical   Suicidal Thoughts:  No  Homicidal Thoughts:  No  Memory:  Immediate;   Good  Judgement:  Good  Insight:  Fair  Psychomotor Activity:  Normal  Concentration:  Concentration: Good and Attention Span: Good  Recall:  Good  Fund of Knowledge: Good  Language: Good  Akathisia:  No  Handed:  Right  AIMS (if indicated): not done  Assets:  Communication Skills Desire for Improvement  ADL's:  Intact  Cognition: WNL  Sleep:  Fair   Screenings: GAD-7   [place] Office Visit from 04/22/2018 in [facility] Office Visit from 06/03/2017 in [facility] Office Visit from 09/24/2016 in [facility] Office Visit from 03/27/2016 in [place]  Total GAD-7 Score 11 3 15 16     PHQ2-9   Flowsheet Row Video Visit from 11/07/2020 in [place] Office Visit from 04/22/2018 in [place] Visit from 06/03/2017 in [place] Visit from 09/24/2016 in [facility] [place] Visit from 05/01/2016 in [facility]  PHQ-2 Total Score 4 5 0 2 5  PHQ-9 Total Score 11 15 4 7 8        Assessment and Plan:   Jacqueline Good is a 60 y.o. year old female with a history of depression,OCD,estrogen negativeT2 pN1, stage IIB invasive ductal carcinoma, diagnosed07/18/2019s/p mastectomy1/2020,neoadjuvantchemo immunotherapy/on trastzumab,asthma, OA, chronic back pain, who presents for follow up appointment for below.   1. MDD (major depressive disorder), recurrent episode, mild ([place]) 2. Generalized anxiety disorder Although she continues to have occasional depressed mood, it has been relatively manageable since the last visit.  Psychosocial stressors includes her medical condition, unemployment, chronic pain, and taking care of her mother with medical condition.  Will continue current medication regimen.  Will continue venlafaxine to target depression and anxiety.  We will continue mirtazapine to target depression.  Discussed potential metabolic side effect.  Will continue Latuda as adjunctive treatment for depression.  Discussed potential metabolic side effect and EPS.  Will continue Xanax as needed for anxiety.  She is aware of its risk of dependence and oversedation. Although she will benefit from CBT, she is not interested in this.   3. Insomnia, unspecified type She has good benefit from Ambien.  We will continue current dose to target insomnia.  Although she does have snoring and referral for sleep evaluation was discussed in the past, she is not interested in this.  # inattention She complains of worsening in concentration over the past few years before chemotherapy.  No history of IEP/ADHD.  The etiology is likely multifactorial given her ongoing mood symptoms.  She is not interested in a neuropsychological evaluation.   Plan I have reviewed and updated plans as below 1.  Continue venlafaxine 225 mg daily 2. Continue Latuda40mg , take at night- monitor weight gain 3.Continue mirtazapine15 mg at night  4.Continue Xanax 1 mg three times a day (and additional 0.5 mg daily) as needed for  anxiety 5. Continue Ambien 10 mg at night as needed for insomnia 6. Next appointment: 5/25 at 3:30 for 30 mins, video  I have utilized the [place] Controlled Substances Reporting System (PMP AWARxE) to confirm adherence regarding the patient's medication. My review reveals appropriate prescription fills.   Past trials of medication: sertraline, fluoxetine, lexapro,Buspar,venlafaxine, duloxetine, bupropion, quetiapine, Abilify  The patient demonstrates the following risk factors for suicide: Chronic risk factors for suicide include:psychiatric disorder ofdepressionand medical illness of breast cancer. Acute risk factorsfor suicide include: unemployment and loss (financial, interpersonal, professional). Protective factorsfor this patient include: positive social support, responsibility to others (children, family), coping skills and hope for the future. Considering these factors, the overall suicide risk at this point appears to below. Patientisappropriate for outpatient follow up.  Norman Clay, MD 11/07/2020, 4:03 PM

## 2020-11-07 NOTE — Patient Instructions (Signed)
1. Continue venlafaxine 225 mg daily 2. Continue Latuda40mg , take at night- monitor weight gain 3.Continue mirtazapine15 mg at night  4.Continue Xanax 1 mg three times a day (and additional 0.5 mg daily) as needed for anxiety 5. Continue Ambien 10 mg at night as needed for insomnia 6. Next appointment: 5/25 at 3:30

## 2020-11-12 ENCOUNTER — Other Ambulatory Visit (HOSPITAL_COMMUNITY): Payer: Self-pay

## 2020-11-12 MED FILL — Esomeprazole Magnesium Cap Delayed Release 40 MG (Base Eq): ORAL | 30 days supply | Qty: 30 | Fill #0 | Status: AC

## 2020-11-12 MED FILL — Zolpidem Tartrate Tab 10 MG: ORAL | 30 days supply | Qty: 30 | Fill #0 | Status: AC

## 2020-11-12 MED FILL — Alprazolam Tab 1 MG: ORAL | 30 days supply | Qty: 105 | Fill #0 | Status: AC

## 2020-11-13 ENCOUNTER — Other Ambulatory Visit (HOSPITAL_COMMUNITY): Payer: Self-pay

## 2020-11-13 MED ORDER — TIZANIDINE HCL 4 MG PO TABS
4.0000 mg | ORAL_TABLET | Freq: Two times a day (BID) | ORAL | 0 refills | Status: DC | PRN
  Filled 2020-11-13: qty 30, 15d supply, fill #0

## 2020-11-14 ENCOUNTER — Other Ambulatory Visit (HOSPITAL_COMMUNITY): Payer: Self-pay

## 2020-11-19 ENCOUNTER — Ambulatory Visit
Admission: RE | Admit: 2020-11-19 | Discharge: 2020-11-19 | Disposition: A | Discharge status: Home / Self Care | Payer: Medicare Other | Source: Ambulatory Visit | Attending: Physician Assistant | Admitting: Physician Assistant

## 2020-11-19 DIAGNOSIS — R1310 Dysphagia, unspecified: Secondary | ICD-10-CM

## 2020-11-30 ENCOUNTER — Other Ambulatory Visit (HOSPITAL_COMMUNITY): Payer: Self-pay

## 2020-11-30 MED FILL — Mirtazapine Tab 15 MG: ORAL | 90 days supply | Qty: 90 | Fill #0 | Status: CN

## 2020-11-30 MED FILL — Lurasidone HCl Tab 40 MG: ORAL | 30 days supply | Qty: 30 | Fill #0 | Status: AC

## 2020-12-06 ENCOUNTER — Other Ambulatory Visit (HOSPITAL_COMMUNITY): Payer: Self-pay

## 2020-12-10 ENCOUNTER — Other Ambulatory Visit (HOSPITAL_COMMUNITY): Payer: Self-pay

## 2020-12-10 ENCOUNTER — Other Ambulatory Visit: Payer: Self-pay | Admitting: Oncology

## 2020-12-10 MED ORDER — ESOMEPRAZOLE MAGNESIUM 40 MG PO CPDR
DELAYED_RELEASE_CAPSULE | Freq: Every day | ORAL | 2 refills | Status: DC
  Filled 2020-12-10: qty 30, 30d supply, fill #0
  Filled 2021-01-10: qty 30, 30d supply, fill #1
  Filled 2021-03-11: qty 30, 30d supply, fill #2

## 2020-12-10 MED FILL — Venlafaxine HCl Tab 75 MG (Base Equivalent): ORAL | 90 days supply | Qty: 270 | Fill #0 | Status: CN

## 2020-12-10 MED FILL — Mirtazapine Tab 15 MG: ORAL | 90 days supply | Qty: 90 | Fill #0 | Status: CN

## 2020-12-12 ENCOUNTER — Other Ambulatory Visit (HOSPITAL_COMMUNITY): Payer: Self-pay

## 2020-12-12 MED FILL — Alprazolam Tab 1 MG: ORAL | 30 days supply | Qty: 105 | Fill #1 | Status: AC

## 2020-12-12 MED FILL — Zolpidem Tartrate Tab 10 MG: ORAL | 30 days supply | Qty: 30 | Fill #1 | Status: AC

## 2020-12-19 ENCOUNTER — Other Ambulatory Visit: Payer: Self-pay | Admitting: Physician Assistant

## 2020-12-19 DIAGNOSIS — R1011 Right upper quadrant pain: Secondary | ICD-10-CM

## 2020-12-26 ENCOUNTER — Ambulatory Visit
Admission: RE | Admit: 2020-12-26 | Discharge: 2020-12-26 | Disposition: A | Discharge status: Home / Self Care | Payer: Medicare Other | Source: Ambulatory Visit | Attending: Physician Assistant | Admitting: Physician Assistant

## 2020-12-26 DIAGNOSIS — R1011 Right upper quadrant pain: Secondary | ICD-10-CM

## 2021-01-01 NOTE — Progress Notes (Signed)
Virtual Visit via Video Note  I connected with Jacqueline Good on 01/08/21 at  2:30 PM EDT by a video enabled telemedicine application and verified that I am speaking with the correct person using two identifiers.  Location: Patient: home Provider: office Persons participated in the visit- patient, provider   I discussed the limitations of evaluation and management by telemedicine and the availability of in person appointments. The patient expressed understanding and agreed to proceed.   I discussed the assessment and treatment plan with the patient. The patient was provided an opportunity to ask questions and all were answered. The patient agreed with the plan and demonstrated an understanding of the instructions.   The patient was advised to call back or seek an in-person evaluation if the symptoms worsen or if the condition fails to improve as anticipated.  I provided 15 minutes of non-face-to-face time during this encounter.   Norman Clay, MD    [facility] MD/PA/NP OP Progress Note  01/08/2021 3:04 PM Jacqueline Good  MRN:  [id number]  Chief Complaint:  Chief Complaint    Follow-up; Depression; Trauma     HPI:  This is a follow-up appointment for PTSD and depression.  She states that she is feeling more anxious.  She watches news, and she feels more fearful of going outside.  Although she wanted to get her nails done, she could not do it.  She also fells "not 100% comfortable" at her house.  She feels comfortable visiting her mother.  She asks if she can increase up the dose of Xanax.  This was declined due to concern of increasing risk of dependence.  She states not to ask her the option of treatment as this clinician is "not obliged" to her request.  She has initial and middle insomnia.  She feels depressed and anxious.  She feels fatigue.  She denies change in weight or appetite; Per chart review, she did lose some weight, although she does not feel that way.  She denies SI.  She  denies alcohol use or drug use.   230 lbs Wt Readings from Last 3 Encounters:  08/15/20 240 lb 6.4 oz (109 kg)  02/09/20 237 lb 4.8 oz (107.6 kg)  01/24/20 237 lb 12.8 oz (107.9 kg)    Daily routine:household chores, Employment:used to work as Research scientist (physical sciences), last in 2017; although the patient was terminated, it was a good timing for the patient as she was taking care of her cousin, who later moved into nursing home. Support: Household:by herself. Her mother lives next door Marital status:single Number of children:1 daughter, who lives in [place].She has estranged relationship with the father of her daughter. She grew up in [place].Her parents got divorced when she was 14-year-old.Although she did not like them arguing with each other,she reports good relationship with her mother,who raised the patient and other siblings.  Visit Diagnosis:    ICD-10-CM   1. MDD (major depressive disorder), recurrent episode, moderate (HCC)  F33.1   2. Generalized anxiety disorder  F41.1   3. Insomnia, unspecified type  G47.00 zolpidem (AMBIEN) 10 MG tablet    Past Psychiatric History: Please see initial evaluation for full details. I have reviewed the history. No updates at this time.     Past Medical History:  Past Medical History:  Diagnosis Date  . Anemia   . Anxiety   . Arthritis    "all over" (08/12/2018)  . Asthma   . Breast cancer, right breast ([place]) dx'd 02/2018  . Chronic  bronchitis ([place])   . Depression   . Family history of breast cancer   . Family history of prostate cancer   . Family history of uterine cancer   . GERD (gastroesophageal reflux disease)   . History of blood transfusion    "several; related to low blood" (08/12/2018)  . History of radiation therapy 11/15/18- 12/17/18   Right Chest wall, SCV, PAB. 25 fractions.   . Personal history of chemotherapy   . Personal history of radiation therapy     Past Surgical History:  Procedure Laterality Date  . AXILLARY  LYMPH NODE DISSECTION Right 08/12/2018   Procedure: AXILLARY LYMPH NODE DISSECTION;  Surgeon: Alphonsa Overall, MD;  Location: [facility];  Service: General;  Laterality: Right;  . BREAST BIOPSY Right 03/2018  . BREAST RECONSTRUCTION WITH PLACEMENT OF TISSUE EXPANDER AND FLEX HD (ACELLULAR HYDRATED DERMIS) Right 08/12/2018   Procedure: RIGHT BREAST RECONSTRUCTION WITH PLACEMENT OF TISSUE EXPANDER AND FLEX HD (ACELLULAR HYDRATED DERMIS);  Surgeon: Wallace Going, DO;  Location: [facility];  Service: Plastics;  Laterality: Right;  . BREAST REDUCTION SURGERY Left 09/28/2019   Procedure: LEFT MAMMARY REDUCTION/MASTOPEXY  (BREAST);  Surgeon: Wallace Going, DO;  Location: [facility];  Service: Plastics;  Laterality: Left;  . DILATION AND CURETTAGE OF UTERUS    . ENDOMETRIAL ABLATION    . IR IMAGING GUIDED PORT INSERTION  03/18/2018  . MASTECTOMY MODIFIED RADICAL Right 08/12/2018   Procedure: RIGHT MODIFIED RADICAL MASTECTOMY;  Surgeon: Alphonsa Overall, MD;  Location: [facility];  Service: General;  Laterality: Right;  . MASTECTOMY MODIFIED RADICAL Right 08/12/2018   w/axillary LND  . MYOMECTOMY    . REDUCTION MAMMAPLASTY Left 2021  . REMOVAL OF TISSUE EXPANDER AND PLACEMENT OF IMPLANT Right 10/26/2018   Procedure: Removal of right breast expander;  Surgeon: Wallace Going, DO;  Location: [facility];  Service: Plastics;  Laterality: Right;  75 min, please    Family Psychiatric History: Please see initial evaluation for full details. I have reviewed the history. No updates at this time.     Family History:  Family History  Problem Relation Age of Onset  . Lupus Sister   . Heart disease Sister   . Breast cancer Mother 34  . Prostate cancer Paternal Uncle   . Diabetes Maternal Grandmother   . Heart Problems Maternal Grandmother   . Prostate cancer Maternal Grandfather   . Prostate cancer Paternal Grandfather   . Prostate cancer Other        MGFs brother  . Breast cancer Other        Mat  great-grandmother's sister  . Uterine cancer Maternal Great-grandmother   . Prostate cancer Other        PGFs brother    Social History:  Social History   Socioeconomic History  . Marital status: Single    Spouse name: Not on file  . Number of children: Not on file  . Years of education: Not on file  . Highest education level: Not on file  Occupational History  . Not on file  Tobacco Use  . Smoking status: Former Smoker    Packs/day: 1.00    Years: 38.00    Pack years: 38.00    Types: Cigarettes    Quit date: 05/15/2018    Years since quitting: 2.6  . Smokeless tobacco: Never Used  Vaping Use  . Vaping Use: Never used  Substance and Sexual Activity  . Alcohol use: Yes    Comment:  occ  . Drug use: No  . Sexual activity: Not Currently    Birth control/protection: None  Other Topics Concern  . Not on file  Social History Narrative  . Not on file   Social Determinants of Health   Financial Resource Strain: Not on file  Food Insecurity: Not on file  Transportation Needs: Not on file  Physical Activity: Not on file  Stress: Not on file  Social Connections: Not on file    Allergies:  Allergies  Allergen Reactions  . Gadolinium Derivatives Itching and Cough    Pt began sneezing and coughing as soon as MRI contrast was injected. Severe nasal congestion. No rash or hives no SOB.   Marland Kitchen Hydrocodone Hives and Itching    Pt says she can take it with benadryl.   . Iodinated Diagnostic Agents Itching    Metabolic Disorder Labs: Lab Results  Component Value Date   HGBA1C 5.4 04/27/2014   No results found for: PROLACTIN Lab Results  Component Value Date   CHOL 201 (H) 02/18/2019   TRIG 115 02/18/2019   HDL 63 02/18/2019   CHOLHDL 3.2 02/18/2019   VLDL 23 02/18/2019   LDLCALC 115 (H) 02/18/2019   LDLCALC 76 09/24/2016   Lab Results  Component Value Date   TSH 0.58 09/24/2016   TSH 0.696 04/27/2014    Therapeutic Level Labs: No results found for: LITHIUM No  results found for: VALPROATE No components found for:  CBMZ  Current Medications: Current Outpatient Medications  Medication Sig Dispense Refill  . [START ON 01/12/2021] ALPRAZolam (XANAX) 1 MG tablet TAKE 1 TABLET BY MOUTH 3 TIMES DAILY AND 1/2 TABLET AS NEEDED FOR ANXIETY (MAX 3.5MG  DAILY) 105 tablet 2  . esomeprazole (NEXIUM) 40 MG capsule TAKE 1 CAPSULE (40 MG TOTAL) BY MOUTH DAILY. 30 capsule 2  . fluticasone (FLONASE) 50 MCG/ACT nasal spray Place 2 sprays into both nostrils daily as needed for up to 14 days for allergies. 1 g 1  . Fluticasone-Salmeterol (ADVAIR DISKUS) 250-50 MCG/DOSE AEPB INHALE 1 PUFF INTO THE LUNGS 2 TIMES DAILY. 60 each 2  . loratadine (CLARITIN) 10 MG tablet Take 1 tablet (10 mg total) by mouth daily as needed for allergies. 30 tablet 11  . [START ON 03/01/2021] lurasidone (LATUDA) 40 MG TABS tablet TAKE 1 TABLET (40 MG TOTAL) BY MOUTH DAILY WITH BREAKFAST. (09/26/20) 90 tablet 1  . meloxicam (MOBIC) 15 MG tablet TAKE 1 TABLET BY MOUTH ONCE A DAY 30 tablet 3  . mirtazapine (REMERON) 15 MG tablet TAKE 1 TABLET BY MOUTH AT BEDTIME (FILL AFTER 5/5) 90 tablet 0  . naproxen (NAPROSYN) 500 MG tablet TAKE 1 TABLET BY MOUTH 2 TIMES DAILY WITH MEALS 60 tablet 2  . omeprazole (PRILOSEC) 40 MG capsule TAKE 1 CAPSULE BY MOUTH DAILY. 30 capsule 2  . predniSONE (DELTASONE) 5 MG tablet Take 5 mg by mouth daily with breakfast.    . predniSONE (DELTASONE) 50 MG tablet TAKE 1 TABLET BY MOUTH AT 3:20 AM ON 06/23/20, 1 TABLET AT 9:20 AM THEN THE LAST DOSE AT 3:20 PM. TAKE THE LAST OF PREDNISONE WITH 50 MG OF BENADRYL 3 tablet 0  . predniSONE (STERAPRED UNI-PAK 21 TAB) 5 MG (21) TBPK tablet TAKE AS DIRECTED PER PACKAGE INSTRUCTIONS. 21 each 0  . propranolol (INDERAL) 40 MG tablet Take 40 mg by mouth daily as needed (heart palpitation.).     Marland Kitchen tiZANidine (ZANAFLEX) 4 MG tablet     . tiZANidine (ZANAFLEX) 4 MG tablet  TAKE 1 TABLET BY MOUTH TWICE DAILY AS NEEDED 30 tablet 0  . venlafaxine  (EFFEXOR) 75 MG tablet TAKE 3 TABLETS BY MOUTH ONCE A DAY (FILL AFTER 5/5) 270 tablet 0  . zolpidem (AMBIEN) 10 MG tablet TAKE 1 TABLET BY MOUTH AT BEDTIME AS NEEDED FOR SLEEP 30 tablet 2   No current facility-administered medications for this visit.     Musculoskeletal: Strength & Muscle Tone: N/A Gait & Station: N/A Patient leans: N/A  Psychiatric Specialty Exam: Review of Systems  Psychiatric/Behavioral: Positive for dysphoric mood and sleep disturbance. Negative for agitation, behavioral problems, confusion, decreased concentration, hallucinations, self-injury and suicidal ideas. The patient is nervous/anxious. The patient is not hyperactive.   All other systems reviewed and are negative.   There were no vitals taken for this visit.There is no height or weight on file to calculate BMI.  General Appearance: Fairly Groomed  Eye Contact:  Good  Speech:  Clear and Coherent  Volume:  Normal  Mood:  not good  Affect:  Appropriate, Congruent and Restricted  Thought Process:  Coherent  Orientation:  Full (Time, Place, and Person)  Thought Content: Logical   Suicidal Thoughts:  No  Homicidal Thoughts:  No  Memory:  Immediate;   Good  Judgement:  Good  Insight:  Good  Psychomotor Activity:  Normal  Concentration:  Concentration: Good and Attention Span: Good  Recall:  Good  Fund of Knowledge: Good  Language: Good  Akathisia:  No  Handed:  Right  AIMS (if indicated): not done  Assets:  Communication Skills Desire for Improvement  ADL's:  Intact  Cognition: WNL  Sleep:  Poor   Screenings: GAD-7   Flowsheet Row Office Visit from 04/22/2018 in [facility] Office Visit from 06/03/2017 in [facility] Office Visit from 09/24/2016 in [facility] Office Visit from 03/27/2016 in [facility]  Total GAD-7 Score 11 3 15 16     PHQ2-9   Flowsheet Row Video Visit from  11/07/2020 in [facility] Office Visit from 04/22/2018 in [facility] Office Visit from 06/03/2017 in [facility] Office Visit from 09/24/2016 in [facility] Office Visit from 05/01/2016 in [facility]  PHQ-2 Total Score 4 5 0 2 5  PHQ-9 Total Score 11 15 4 7 8        Assessment and Plan:  Jacqueline Good is a 60 y.o. year old female with a history of depression,OCD,estrogen negativeT2 pN1, stage IIB invasive ductal carcinoma, diagnosed07/18/2019s/p mastectomy1/2020,neoadjuvantchemo immunotherapy/on trastzumab,asthma, OA, chronic back pain, who presents for follow up appointment for below.   1. MDD (major depressive disorder), recurrent episode, moderate ([place]) 2. Generalized anxiety disorder She reports slight worsening in anxiety in the context of recent news going around in the world.  Other psychosocial stressors includes her medical condition, unemployment, chronic pain, and taking care of her mother with medical condition.   She is not interested in adjusting her medication except Xanax.  Will not uptitrate Xanax given she is already on high-dose/to avoid potential risk of dependence.  Will continue venlafaxine, mirtazapine and Latuda to target depression.  Although she will greatly benefit from CBT, she is not interested in this.   3. Insomnia, unspecified type She has good benefit from Ambien.  We will continue the current dose to target insomnia. Although she does have  snoring and referral for sleep evaluation was discussed in the past, she is not interested in this.  # inattention She complains of worsening in concentration over the past few years before chemotherapy.No history of IEP/ADHD.The etiology is likely multifactorial given her ongoing mood symptoms. She is not interested in a neuropsychological evaluation.  This  clinician has discussed the side effect associated with medication prescribed during this encounter. Please refer to notes in the previous encounters for more details.   Plan I have reviewed and updated plans as below 1. Continue venlafaxine 225 mg daily 2.ContinueLatuda40mg , take at night- monitor weight gain 3.Continue mirtazapine15 mg at night  4.Continue Xanax 1 mg three times a day (and additional 0.5 mg daily) as needed for anxiety 5. Continue Ambien 10 mg at night as needed for insomnia 6. Next appointment:8/30 at 2 PM for 30 mins, video. She declined in person visit as she does not drive highway.     Past trials of medication: sertraline, fluoxetine, lexapro,Buspar,venlafaxine, duloxetine, bupropion, quetiapine, Abilify  The patient demonstrates the following risk factors for suicide: Chronic risk factors for suicide include:psychiatric disorder ofdepressionand medical illness of breast cancer. Acute risk factorsfor suicide include: unemployment and loss (financial, interpersonal, professional). Protective factorsfor this patient include: positive social support, responsibility to others (children, family), coping skills and hope for the future. Considering these factors, the overall suicide risk at this point appears to below. Patientisappropriate for outpatient follow up.   Norman Clay, MD 01/08/2021, 3:04 PM

## 2021-01-02 ENCOUNTER — Telehealth: Payer: Medicare Other | Admitting: Psychiatry

## 2021-01-08 ENCOUNTER — Telehealth (INDEPENDENT_AMBULATORY_CARE_PROVIDER_SITE_OTHER): Payer: Medicare Other | Admitting: Psychiatry

## 2021-01-08 ENCOUNTER — Other Ambulatory Visit (HOSPITAL_COMMUNITY): Payer: Self-pay

## 2021-01-08 ENCOUNTER — Encounter: Payer: Self-pay | Admitting: Psychiatry

## 2021-01-08 ENCOUNTER — Other Ambulatory Visit: Payer: Self-pay

## 2021-01-08 DIAGNOSIS — G4700 Insomnia, unspecified: Secondary | ICD-10-CM

## 2021-01-08 DIAGNOSIS — F411 Generalized anxiety disorder: Secondary | ICD-10-CM | POA: Diagnosis not present

## 2021-01-08 DIAGNOSIS — F331 Major depressive disorder, recurrent, moderate: Secondary | ICD-10-CM

## 2021-01-08 MED ORDER — ZOLPIDEM TARTRATE 10 MG PO TABS
ORAL_TABLET | Freq: Every evening | ORAL | 2 refills | Status: DC | PRN
  Filled 2021-01-08: qty 30, fill #0
  Filled 2021-01-09: qty 30, 30d supply, fill #0
  Filled 2021-02-12: qty 30, 30d supply, fill #1
  Filled 2021-03-11: qty 30, 30d supply, fill #2

## 2021-01-08 MED ORDER — ALPRAZOLAM 1 MG PO TABS
ORAL_TABLET | ORAL | 2 refills | Status: DC
  Filled 2021-01-08: qty 105, fill #0
  Filled 2021-01-09: qty 105, 30d supply, fill #0
  Filled 2021-02-12: qty 105, 30d supply, fill #1
  Filled 2021-03-11: qty 105, 30d supply, fill #2

## 2021-01-08 MED ORDER — LURASIDONE HCL 40 MG PO TABS
ORAL_TABLET | ORAL | 1 refills | Status: DC
  Filled 2021-01-08: qty 90, fill #0
  Filled 2021-01-10: qty 90, 90d supply, fill #0
  Filled 2021-01-10 (×2): qty 30, 30d supply, fill #0
  Filled 2021-02-12: qty 30, 30d supply, fill #1
  Filled 2021-03-11: qty 30, 30d supply, fill #2
  Filled 2021-04-12: qty 30, 30d supply, fill #3
  Filled 2021-05-11: qty 30, 30d supply, fill #4

## 2021-01-08 NOTE — Patient Instructions (Signed)
1. Continue venlafaxine 225 mg daily 2.ContinueLatuda40mg , take at night 3.Continue mirtazapine15 mg at night  4.Continue Xanax 1 mg three times a day (and additional 0.5 mg daily) as needed for anxiety 5. Continue Ambien 10 mg at night as needed for insomnia 6. Next appointment:8/30 at 2 PM

## 2021-01-09 ENCOUNTER — Other Ambulatory Visit (HOSPITAL_COMMUNITY): Payer: Self-pay

## 2021-01-10 ENCOUNTER — Other Ambulatory Visit: Payer: Self-pay | Admitting: Physician Assistant

## 2021-01-10 ENCOUNTER — Other Ambulatory Visit (HOSPITAL_COMMUNITY): Payer: Self-pay

## 2021-01-10 DIAGNOSIS — Z87891 Personal history of nicotine dependence: Secondary | ICD-10-CM

## 2021-01-10 MED FILL — Mirtazapine Tab 15 MG: ORAL | 90 days supply | Qty: 90 | Fill #0 | Status: AC

## 2021-01-10 MED FILL — Venlafaxine HCl Tab 75 MG (Base Equivalent): ORAL | 90 days supply | Qty: 270 | Fill #0 | Status: AC

## 2021-01-10 MED FILL — Meloxicam Tab 15 MG: ORAL | 30 days supply | Qty: 30 | Fill #0 | Status: AC

## 2021-01-11 ENCOUNTER — Other Ambulatory Visit (HOSPITAL_COMMUNITY): Payer: Self-pay

## 2021-01-22 ENCOUNTER — Ambulatory Visit
Admission: RE | Admit: 2021-01-22 | Discharge: 2021-01-22 | Disposition: A | Discharge status: Home / Self Care | Payer: Medicare Other | Source: Ambulatory Visit | Attending: Physician Assistant | Admitting: Physician Assistant

## 2021-01-22 DIAGNOSIS — Z87891 Personal history of nicotine dependence: Secondary | ICD-10-CM

## 2021-01-25 ENCOUNTER — Encounter: Payer: Self-pay | Admitting: Oncology

## 2021-01-25 ENCOUNTER — Encounter: Payer: Self-pay | Admitting: Adult Health

## 2021-02-04 ENCOUNTER — Other Ambulatory Visit (HOSPITAL_COMMUNITY): Payer: Self-pay

## 2021-02-12 ENCOUNTER — Other Ambulatory Visit (HOSPITAL_COMMUNITY): Payer: Self-pay

## 2021-02-25 ENCOUNTER — Telehealth: Payer: Self-pay | Admitting: *Deleted

## 2021-02-25 DIAGNOSIS — Z9011 Acquired absence of right breast and nipple: Secondary | ICD-10-CM

## 2021-02-25 DIAGNOSIS — Z171 Estrogen receptor negative status [ER-]: Secondary | ICD-10-CM

## 2021-02-25 DIAGNOSIS — C50411 Malignant neoplasm of upper-outer quadrant of right female breast: Secondary | ICD-10-CM

## 2021-02-25 DIAGNOSIS — R0789 Other chest pain: Secondary | ICD-10-CM

## 2021-02-25 NOTE — Telephone Encounter (Signed)
This RN spoke with pt per her call stating concern for ongoing right chest wall discomfort " like having a muscle tightness and then if feels like it's twisting and then is sore."  She states discomfort is at this site of mastectomy.  She does not feel any lumps or cording under scar.  This discussed with pt above not uncommon post mastectomies due to nature of surgery -reviewed recent screening CT not showing any chest wall abnormality.  Pt stated concern due to ongoing ( pt is ER/PR negative) and her history of cancer.  Per discussion- U/S of right chest wall will be ordered.

## 2021-03-02 ENCOUNTER — Ambulatory Visit
Admission: RE | Admit: 2021-03-02 | Discharge: 2021-03-02 | Disposition: A | Discharge status: Home / Self Care | Payer: Medicare Other | Source: Ambulatory Visit | Attending: Oncology | Admitting: Oncology

## 2021-03-02 ENCOUNTER — Other Ambulatory Visit: Payer: Self-pay

## 2021-03-02 DIAGNOSIS — Z9011 Acquired absence of right breast and nipple: Secondary | ICD-10-CM

## 2021-03-02 DIAGNOSIS — R0789 Other chest pain: Secondary | ICD-10-CM

## 2021-03-02 DIAGNOSIS — C50411 Malignant neoplasm of upper-outer quadrant of right female breast: Secondary | ICD-10-CM

## 2021-03-02 DIAGNOSIS — Z171 Estrogen receptor negative status [ER-]: Secondary | ICD-10-CM

## 2021-03-07 ENCOUNTER — Encounter: Payer: Self-pay | Admitting: Adult Health

## 2021-03-07 ENCOUNTER — Encounter: Payer: Self-pay | Admitting: Oncology

## 2021-03-07 ENCOUNTER — Other Ambulatory Visit (HOSPITAL_COMMUNITY): Payer: Self-pay

## 2021-03-07 MED ORDER — PEG 3350-KCL-NA BICARB-NACL 420 G PO SOLR
ORAL | 0 refills | Status: DC
  Filled 2021-03-07: qty 4000, 1d supply, fill #0

## 2021-03-11 ENCOUNTER — Other Ambulatory Visit (HOSPITAL_COMMUNITY): Payer: Self-pay

## 2021-03-13 DIAGNOSIS — K529 Noninfective gastroenteritis and colitis, unspecified: Secondary | ICD-10-CM | POA: Diagnosis not present

## 2021-03-13 DIAGNOSIS — K2930 Chronic superficial gastritis without bleeding: Secondary | ICD-10-CM | POA: Diagnosis not present

## 2021-03-13 DIAGNOSIS — Z1211 Encounter for screening for malignant neoplasm of colon: Secondary | ICD-10-CM | POA: Diagnosis not present

## 2021-03-13 DIAGNOSIS — K222 Esophageal obstruction: Secondary | ICD-10-CM | POA: Diagnosis not present

## 2021-03-13 DIAGNOSIS — K633 Ulcer of intestine: Secondary | ICD-10-CM | POA: Diagnosis not present

## 2021-03-13 DIAGNOSIS — R12 Heartburn: Secondary | ICD-10-CM | POA: Diagnosis not present

## 2021-03-13 DIAGNOSIS — R1310 Dysphagia, unspecified: Secondary | ICD-10-CM | POA: Diagnosis not present

## 2021-03-13 DIAGNOSIS — K5289 Other specified noninfective gastroenteritis and colitis: Secondary | ICD-10-CM | POA: Diagnosis not present

## 2021-03-13 DIAGNOSIS — K2980 Duodenitis without bleeding: Secondary | ICD-10-CM | POA: Diagnosis not present

## 2021-03-13 DIAGNOSIS — K635 Polyp of colon: Secondary | ICD-10-CM | POA: Diagnosis not present

## 2021-03-13 DIAGNOSIS — K2970 Gastritis, unspecified, without bleeding: Secondary | ICD-10-CM | POA: Diagnosis not present

## 2021-03-13 DIAGNOSIS — K6389 Other specified diseases of intestine: Secondary | ICD-10-CM | POA: Diagnosis not present

## 2021-03-14 ENCOUNTER — Encounter: Payer: Self-pay | Admitting: Oncology

## 2021-03-14 ENCOUNTER — Encounter: Payer: Self-pay | Admitting: Adult Health

## 2021-03-14 DIAGNOSIS — M545 Low back pain, unspecified: Secondary | ICD-10-CM | POA: Diagnosis not present

## 2021-03-18 DIAGNOSIS — K2930 Chronic superficial gastritis without bleeding: Secondary | ICD-10-CM | POA: Diagnosis not present

## 2021-03-18 DIAGNOSIS — K5289 Other specified noninfective gastroenteritis and colitis: Secondary | ICD-10-CM | POA: Diagnosis not present

## 2021-03-18 DIAGNOSIS — K635 Polyp of colon: Secondary | ICD-10-CM | POA: Diagnosis not present

## 2021-03-19 DIAGNOSIS — M545 Low back pain, unspecified: Secondary | ICD-10-CM | POA: Diagnosis not present

## 2021-03-21 ENCOUNTER — Encounter (INDEPENDENT_AMBULATORY_CARE_PROVIDER_SITE_OTHER): Payer: Self-pay | Admitting: Bariatrics

## 2021-03-21 ENCOUNTER — Encounter: Payer: Self-pay | Admitting: Oncology

## 2021-03-21 ENCOUNTER — Encounter: Payer: Self-pay | Admitting: Adult Health

## 2021-03-21 ENCOUNTER — Ambulatory Visit (INDEPENDENT_AMBULATORY_CARE_PROVIDER_SITE_OTHER): Payer: Medicare Other | Admitting: Bariatrics

## 2021-03-21 ENCOUNTER — Other Ambulatory Visit: Payer: Self-pay

## 2021-03-21 VITALS — BP 123/86 | HR 74 | Temp 98.7°F | Ht 64.0 in | Wt 238.0 lb

## 2021-03-21 DIAGNOSIS — M549 Dorsalgia, unspecified: Secondary | ICD-10-CM | POA: Diagnosis not present

## 2021-03-21 DIAGNOSIS — E559 Vitamin D deficiency, unspecified: Secondary | ICD-10-CM

## 2021-03-21 DIAGNOSIS — Z6841 Body Mass Index (BMI) 40.0 and over, adult: Secondary | ICD-10-CM

## 2021-03-21 DIAGNOSIS — R7309 Other abnormal glucose: Secondary | ICD-10-CM | POA: Diagnosis not present

## 2021-03-21 DIAGNOSIS — R5383 Other fatigue: Secondary | ICD-10-CM | POA: Diagnosis not present

## 2021-03-21 DIAGNOSIS — G8929 Other chronic pain: Secondary | ICD-10-CM | POA: Diagnosis not present

## 2021-03-21 DIAGNOSIS — R0602 Shortness of breath: Secondary | ICD-10-CM

## 2021-03-21 DIAGNOSIS — E7800 Pure hypercholesterolemia, unspecified: Secondary | ICD-10-CM | POA: Diagnosis not present

## 2021-03-21 DIAGNOSIS — E1169 Type 2 diabetes mellitus with other specified complication: Secondary | ICD-10-CM

## 2021-03-21 DIAGNOSIS — E538 Deficiency of other specified B group vitamins: Secondary | ICD-10-CM

## 2021-03-21 DIAGNOSIS — Z1331 Encounter for screening for depression: Secondary | ICD-10-CM | POA: Diagnosis not present

## 2021-03-22 LAB — LIPID PANEL WITH LDL/HDL RATIO
Cholesterol, Total: 175 mg/dL (ref 100–199)
HDL: 44 mg/dL (ref >39)
LDL Chol Calc (NIH): 93 mg/dL (ref 0–99)
LDL/HDL Ratio: 2.1 ratio (ref 0.0–3.2)
Triglycerides: 227 mg/dL — ABNORMAL HIGH (ref 0–149)
VLDL Cholesterol Cal: 38 mg/dL (ref 5–40)

## 2021-03-22 LAB — TSH: TSH: 2.2 u[IU]/mL (ref 0.450–4.500)

## 2021-03-22 LAB — HEMOGLOBIN A1C
Est. average glucose Bld gHb Est-mCnc: 111 mg/dL
Hgb A1c MFr Bld: 5.5 % (ref 4.8–5.6)

## 2021-03-22 LAB — T4, FREE: Free T4: 0.93 ng/dL (ref 0.82–1.77)

## 2021-03-22 LAB — INSULIN, RANDOM: INSULIN: 31.4 u[IU]/mL — ABNORMAL HIGH (ref 2.6–24.9)

## 2021-03-22 LAB — VITAMIN D 25 HYDROXY (VIT D DEFICIENCY, FRACTURES): Vit D, 25-Hydroxy: 14.4 ng/mL — ABNORMAL LOW (ref 30.0–100.0)

## 2021-03-22 LAB — T3: T3, Total: 129 ng/dL (ref 71–180)

## 2021-03-25 NOTE — Progress Notes (Signed)
Chief Complaint:   OBESITY TOYOKO NISTLER (MR# [id number]) is a 60 y.o. female who presents for evaluation and treatment of obesity and related comorbidities. Current BMI is Body mass index is 40.85 kg/m. Jazzell has been struggling with her weight for many years and has been unsuccessful in either losing weight, maintaining weight loss, or reaching her healthy weight goal.  Bertice likes to cook. She dislikes eggs and certain types of vegetables. She sometimes skips meals.  Janani is currently in the action stage of change and ready to dedicate time achieving and maintaining a healthier weight. Taylin is interested in becoming our patient and working on intensive lifestyle modifications including (but not limited to) diet and exercise for weight loss.  Luciann's habits were reviewed today and are as follows: her desired weight loss is 58 lbs, she started gaining weight in her mid 30's, her heaviest weight ever was 240 pounds, she is a picky eater and doesn't like to eat healthier foods, she snacks frequently in the evenings, she wakes up frequently in the middle of the night to eat, she skips meals frequently, she is frequently drinking liquids with calories, she frequently makes poor food choices, and she struggles with emotional eating.  Depression Screen Jatoya's Food and Mood (modified PHQ-9) score was 4.  Depression screen PHQ 2/9 03/21/2021  Decreased Interest 1  Down, Depressed, Hopeless 0  PHQ - 2 Score 1  Altered sleeping 0  Tired, decreased energy 1  Change in appetite 0  Feeling bad or failure about yourself  0  Trouble concentrating 2  Moving slowly or fidgety/restless 0  Suicidal thoughts 0  PHQ-9 Score 4  Difficult doing work/chores Not difficult at all  Some encounter information is confidential and restricted. Go to Review Flowsheets activity to see all data.  Some recent data might be hidden   Subjective:   1. Other fatigue Vannia admits to daytime  somnolence and admits to waking up refreshed and still tired. Patent has a history of symptoms of daytime fatigue. Nataliah generally gets 8 or 10 hours of sleep per night, and states that she has nightime awakenings and generally restful sleep. Snoring is present. Apneic episodes are not present. Epworth Sleepiness Score is 4.  2. SOB (shortness of breath) on exertion Emiliya notes increasing shortness of breath with exercising and seems to be worsening over time with weight gain. She notes getting out of breath sooner with activity than she used to. This has not gotten worse recently. Tayloranne denies shortness of breath at rest or orthopnea.  3. Chronic back pain, unspecified back location, unspecified back pain laterality Sondra notes her activity is limited. She has a history of injuring her back.  4. Vitamin D deficiency Dustina is currently taking multivitamins.  5. Elevated glucose Delaila has a history of elevated glucose, and has a family history in her grandmother.  6. Elevated cholesterol Genessis is not on medications currently.  Assessment/Plan:   1. Other fatigue Brier does feel that her weight is causing her energy to be lower than it should be. Fatigue may be related to obesity, depression or many other causes. Labs will be ordered, and in the meanwhile, Ahtziri will focus on self care including making healthy food choices, increasing physical activity and focusing on stress reduction.  - EKG 12-Lead - Hemoglobin A1c - Insulin, random - Lipid Panel With LDL/HDL Ratio - T3 - VITAMIN D 25 Hydroxy (Vit-D Deficiency, Fractures) - TSH - T4, free  2.  SOB (shortness of breath) on exertion Donnis does feel that she gets out of breath more easily that she used to when she exercises. Alysabeth's shortness of breath appears to be obesity related and exercise induced. She has agreed to work on weight loss and gradually increase exercise to treat her exercise induced shortness of  breath. Will continue to monitor closely.  3. Chronic back pain, unspecified back location, unspecified back pain laterality Kiffany is to do no pounding exercise, but increase activity as tolerated.  4. Vitamin D deficiency Low Vitamin D level contributes to fatigue and are associated with obesity, breast, and colon cancer. We will check labs today. Chariti will follow-up for routine testing of Vitamin D, at least 2-3 times per year to avoid over-replacement.  - VITAMIN D 25 Hydroxy (Vit-D Deficiency, Fractures)  5. Elevated glucose We will check labs today. Shandricka will continue to follow up as directed.  - Hemoglobin A1c - Insulin, random  6. Elevated cholesterol Cardiovascular risk and specific lipid/LDL goals reviewed. We discussed several lifestyle modifications today. We will check labs today. Jennefer will continue to work on diet, exercise and weight loss efforts. Orders and follow up as documented in patient record.   Counseling Intensive lifestyle modifications are the first line treatment for this issue. Dietary changes: Increase soluble fiber. Decrease simple carbohydrates. Exercise changes: Moderate to vigorous-intensity aerobic activity 150 minutes per week if tolerated. Lipid-lowering medications: see documented in medical record.  - Lipid Panel With LDL/HDL Ratio  7. Depression screen Providencia had a negative depression screening. Depression is commonly associated with obesity and often results in emotional eating behaviors. We will monitor this closely and work on CBT to help improve the non-hunger eating patterns. Referral to Psychology may be required if no improvement is seen as she continues in our clinic.  8. Class 3 severe obesity with serious comorbidity and body mass index (BMI) of 40.0 to 44.9 in adult, unspecified obesity type (HCC) Adream is currently in the action stage of change and her goal is to continue with weight loss efforts. I recommend Zoejane begin  the structured treatment plan as follows:  She has agreed to the Category 3 Plan.  Mindful eating was discussed. I reviewed labs from 08/15/2020 with the patient today.  Exercise goals: No exercise has been prescribed at this time.   Behavioral modification strategies: increasing lean protein intake, decreasing simple carbohydrates, increasing vegetables, increasing water intake, decreasing eating out, no skipping meals, meal planning and cooking strategies, keeping healthy foods in the home, and planning for success.  She was informed of the importance of frequent follow-up visits to maximize her success with intensive lifestyle modifications for her multiple health conditions. She was informed we would discuss her lab results at her next visit unless there is a critical issue that needs to be addressed sooner. Mady agreed to keep her next visit at the agreed upon time to discuss these results.  Objective:   Blood pressure 123/86, pulse 74, temperature 98.7 F (37.1 C), height '5\' 4"'$  (1.626 m), weight 238 lb (108 kg), SpO2 96 %. Body mass index is 40.85 kg/m.  EKG: Normal sinus rhythm, rate 78 BPM.  Indirect Calorimeter completed today shows a VO2 of 328 and a REE of 2285.  Her calculated basal metabolic rate is [id number] thus her basal metabolic rate is better than expected.  General: Cooperative, alert, well developed, in no acute distress. HEENT: Conjunctivae and lids unremarkable. Cardiovascular: Regular rhythm.  Lungs: Normal work of breathing. Neurologic:  No focal deficits.   Lab Results  Component Value Date   CREATININE 0.96 08/15/2020   BUN 19 08/15/2020   NA 140 08/15/2020   K 3.8 08/15/2020   CL 106 08/15/2020   CO2 23 08/15/2020   Lab Results  Component Value Date   ALT 29 08/15/2020   AST 20 08/15/2020   ALKPHOS 83 08/15/2020   BILITOT 1.0 08/15/2020   Lab Results  Component Value Date   HGBA1C 5.5 03/21/2021   HGBA1C 5.4 04/27/2014   Lab Results  Component  Value Date   INSULIN 31.4 (H) 03/21/2021   Lab Results  Component Value Date   TSH 2.200 03/21/2021   Lab Results  Component Value Date   CHOL 175 03/21/2021   HDL 44 03/21/2021   LDLCALC 93 03/21/2021   TRIG 227 (H) 03/21/2021   CHOLHDL 3.2 02/18/2019   Lab Results  Component Value Date   WBC 6.7 08/15/2020   HGB 13.0 08/15/2020   HCT 39.8 08/15/2020   MCV 89.0 08/15/2020   PLT 221 08/15/2020   No results found for: IRON, TIBC, FERRITIN Obesity Behavioral Intervention:   Approximately 15 minutes were spent on the discussion below.  ASK: We discussed the diagnosis of obesity with Jhanae today and Rain agreed to give [place] permission to discuss obesity behavioral modification therapy today.  ASSESS: Hadli has the diagnosis of obesity and her BMI today is 41.0. Ashton is in the action stage of change.   ADVISE: Dennell was educated on the multiple health risks of obesity as well as the benefit of weight loss to improve her health. She was advised of the need for long term treatment and the importance of lifestyle modifications to improve her current health and to decrease her risk of future health problems.  AGREE: Multiple dietary modification options and treatment options were discussed and Anaissa agreed to follow the recommendations documented in the above note.  ARRANGE: Erionna was educated on the importance of frequent visits to treat obesity as outlined per CMS and USPSTF guidelines and agreed to schedule her next follow up appointment today.  Attestation Statements:   Reviewed by clinician on day of visit: allergies, medications, problem list, medical history, surgical history, family history, social history, and previous encounter notes.   Wilhemena Durie, am acting as Location manager for [employer], DO.  I have reviewed the above documentation for accuracy and completeness, and I agree with the above. Jearld Lesch, DO

## 2021-03-26 ENCOUNTER — Encounter (INDEPENDENT_AMBULATORY_CARE_PROVIDER_SITE_OTHER): Payer: Self-pay | Admitting: Bariatrics

## 2021-03-26 DIAGNOSIS — E559 Vitamin D deficiency, unspecified: Secondary | ICD-10-CM | POA: Insufficient documentation

## 2021-03-29 DIAGNOSIS — M545 Low back pain, unspecified: Secondary | ICD-10-CM | POA: Diagnosis not present

## 2021-04-02 ENCOUNTER — Encounter: Payer: Self-pay | Admitting: Adult Health

## 2021-04-02 ENCOUNTER — Encounter: Payer: Self-pay | Admitting: Oncology

## 2021-04-02 DIAGNOSIS — M545 Low back pain, unspecified: Secondary | ICD-10-CM | POA: Diagnosis not present

## 2021-04-04 ENCOUNTER — Encounter (INDEPENDENT_AMBULATORY_CARE_PROVIDER_SITE_OTHER): Payer: Self-pay | Admitting: Bariatrics

## 2021-04-04 ENCOUNTER — Ambulatory Visit (INDEPENDENT_AMBULATORY_CARE_PROVIDER_SITE_OTHER): Payer: Medicare Other | Admitting: Bariatrics

## 2021-04-04 ENCOUNTER — Other Ambulatory Visit (HOSPITAL_COMMUNITY): Payer: Self-pay

## 2021-04-04 ENCOUNTER — Other Ambulatory Visit: Payer: Self-pay

## 2021-04-04 VITALS — BP 119/73 | HR 72 | Temp 98.4°F | Ht 64.0 in | Wt 231.0 lb

## 2021-04-04 DIAGNOSIS — E781 Pure hyperglyceridemia: Secondary | ICD-10-CM | POA: Diagnosis not present

## 2021-04-04 DIAGNOSIS — E559 Vitamin D deficiency, unspecified: Secondary | ICD-10-CM | POA: Diagnosis not present

## 2021-04-04 DIAGNOSIS — E8881 Metabolic syndrome: Secondary | ICD-10-CM

## 2021-04-04 DIAGNOSIS — Z6841 Body Mass Index (BMI) 40.0 and over, adult: Secondary | ICD-10-CM | POA: Diagnosis not present

## 2021-04-04 MED ORDER — METFORMIN HCL 500 MG PO TABS
500.0000 mg | ORAL_TABLET | Freq: Every day | ORAL | 0 refills | Status: DC
  Filled 2021-04-04: qty 30, 30d supply, fill #0

## 2021-04-04 MED ORDER — VITAMIN D (ERGOCALCIFEROL) 1.25 MG (50000 UNIT) PO CAPS
50000.0000 [IU] | ORAL_CAPSULE | ORAL | 0 refills | Status: DC
  Filled 2021-04-04: qty 4, 28d supply, fill #0

## 2021-04-05 DIAGNOSIS — M545 Low back pain, unspecified: Secondary | ICD-10-CM | POA: Diagnosis not present

## 2021-04-05 NOTE — Progress Notes (Signed)
Virtual Visit via Video Note  I connected with Jacqueline Good on 04/09/21 at  2:00 PM EDT by a video enabled telemedicine application and verified that I am speaking with the correct person using two identifiers.  Location: Patient: home Provider: office Persons participated in the visit- patient, provider    I discussed the limitations of evaluation and management by telemedicine and the availability of in person appointments. The patient expressed understanding and agreed to proceed.    I discussed the assessment and treatment plan with the patient. The patient was provided an opportunity to ask questions and all were answered. The patient agreed with the plan and demonstrated an understanding of the instructions.   The patient was advised to call back or seek an in-person evaluation if the symptoms worsen or if the condition fails to improve as anticipated.  I provided 20 minutes of non-face-to-face time during this encounter.   Norman Clay, MD    [facility] MD/PA/NP OP Progress Note  04/09/2021 2:38 PM Jacqueline Good  MRN:  [id number]  Chief Complaint:  Chief Complaint   Follow-up; Depression; Anxiety    HPI:  This is a follow-up appointment for depression, anxiety and insomnia.  She states that she has been doing "alright."  When she was asked about the recent visit to wellness clinic, she states that she does not like this clinician to see her medical record.  She was informed that this clinician would access to her to get information necessary for her care.  Although she states that she does not like it, she then asks "next."  She states that she just wants her medication to be filled.  When she is asked about her daily routine, she states that she does not have any routine, but does household chores and visiting her mother.  She states that her mother is doing "good."  She has depressive symptoms as in PHQ-9.  She feels anxious all the time.  She has occasional panic attacks and  feels irritable without significant triggers.  She sleeps fair. Although she was initially interested in adjusting her psychotropics, she was later adamant not to take any risk if it were to cause any weight gain.  Although she denies any weight gain since being on the current medication, she is trying to lose her weight.  She agrees to continue the medication as it is at this time.    Daily routine: she denies having routine, doing housed hold chores, visiting her mother  Employment: used to work as Research scientist (physical sciences), last in 2017; although the patient was terminated, it was a good timing for the patient as she was taking care of her cousin, who later moved into nursing home.  Support: Household: by herself. Her mother lives next door Marital status: single  Number of children:1 daughter, who lives in [place]. She has estranged relationship with the father of her daughter.  She grew up in [place].  Her parents got divorced when she was 65-year-old.  Although she did not like them arguing with each other, she reports good relationship with her mother, who raised the patient and other siblings.   Visit Diagnosis:    ICD-10-CM   1. MDD (major depressive disorder), recurrent episode, moderate (HCC)  F33.1 mirtazapine (REMERON) 30 MG tablet    2. Insomnia, unspecified type  G47.00 zolpidem (AMBIEN) 10 MG tablet    3. Generalized anxiety disorder  F41.1 mirtazapine (REMERON) 30 MG tablet      Past Psychiatric History: Please see  initial evaluation for full details. I have reviewed the history. No updates at this time.     Past Medical History:  Past Medical History:  Diagnosis Date   Anemia    Anxiety    Arthritis    "all over" (08/12/2018)   Asthma    Back pain    Breast cancer, right breast ([place]) dx'd 02/2018   Chronic bronchitis ([place])    Depression    Family history of breast cancer    Family history of prostate cancer    Family history of uterine cancer    GERD (gastroesophageal reflux  disease)    History of blood transfusion    "several; related to low blood" (08/12/2018)   History of radiation therapy 11/15/18- 12/17/18   Right Chest wall, SCV, PAB. 25 fractions.    Lymphedema of right arm    Palpitations    Personal history of chemotherapy    Personal history of radiation therapy     Past Surgical History:  Procedure Laterality Date   AXILLARY LYMPH NODE DISSECTION Right 08/12/2018   Procedure: AXILLARY LYMPH NODE DISSECTION;  Surgeon: Alphonsa Overall, MD;  Location: [place];  Service: General;  Laterality: Right;   BREAST BIOPSY Right 03/2018   BREAST RECONSTRUCTION WITH PLACEMENT OF TISSUE EXPANDER AND FLEX HD (ACELLULAR HYDRATED DERMIS) Right 08/12/2018   Procedure: RIGHT BREAST RECONSTRUCTION WITH PLACEMENT OF TISSUE EXPANDER AND FLEX HD (ACELLULAR HYDRATED DERMIS);  Surgeon: Wallace Going, DO;  Location: [place];  Service: Plastics;  Laterality: Right;   BREAST REDUCTION SURGERY Left 09/28/2019   Procedure: LEFT MAMMARY REDUCTION/MASTOPEXY  (BREAST);  Surgeon: Wallace Going, DO;  Location: [place];  Service: Plastics;  Laterality: Left;   DILATION AND CURETTAGE OF UTERUS     ENDOMETRIAL ABLATION     IR IMAGING GUIDED PORT INSERTION  03/18/2018   MASTECTOMY MODIFIED RADICAL Right 08/12/2018   Procedure: RIGHT MODIFIED RADICAL MASTECTOMY;  Surgeon: Alphonsa Overall, MD;  Location: [place];  Service: General;  Laterality: Right;   MASTECTOMY MODIFIED RADICAL Right 08/12/2018   w/axillary LND   MYOMECTOMY     REDUCTION MAMMAPLASTY Left 2021   REMOVAL OF TISSUE EXPANDER AND PLACEMENT OF IMPLANT Right 10/26/2018   Procedure: Removal of right breast expander;  Surgeon: Wallace Going, DO;  Location: [place];  Service: Plastics;  Laterality: Right;  75 min, please    Family Psychiatric History: Please see initial evaluation for full details. I have reviewed the history. No updates at this time.     Family History:  Family History  Problem Relation Age  of Onset   Lupus Sister    Heart disease Sister    Breast cancer Mother 39   Prostate cancer Paternal Uncle    Diabetes Maternal Grandmother    Heart Problems Maternal Grandmother    Prostate cancer Maternal Grandfather    Prostate cancer Paternal Grandfather    Prostate cancer Other        MGFs brother   Breast cancer Other        Mat great-grandmother's sister   Uterine cancer Maternal Great-grandmother    Prostate cancer Other        PGFs brother    Social History:  Social History   Socioeconomic History   Marital status: Single    Spouse name: Not on file   Number of children: Not on file   Years of education: Not on file   Highest education level: Not on file  Occupational  History   Not on file  Tobacco Use   Smoking status: Former    Packs/day: 1.00    Years: 38.00    Pack years: 38.00    Types: Cigarettes    Quit date: 05/15/2018    Years since quitting: 2.9   Smokeless tobacco: Never  Vaping Use   Vaping Use: Never used  Substance and Sexual Activity   Alcohol use: Yes    Comment: occ   Drug use: No   Sexual activity: Not Currently    Birth control/protection: None  Other Topics Concern   Not on file  Social History Narrative   Not on file   Social Determinants of Health   Financial Resource Strain: Not on file  Food Insecurity: Not on file  Transportation Needs: Not on file  Physical Activity: Not on file  Stress: Not on file  Social Connections: Not on file    Allergies:  Allergies  Allergen Reactions   Gadolinium Derivatives Itching and Cough    Pt began sneezing and coughing as soon as MRI contrast was injected. Severe nasal congestion. No rash or hives no SOB.    Hydrocodone Hives and Itching    Pt says she can take it with benadryl.    Iodinated Diagnostic Agents Itching    Metabolic Disorder Labs: Lab Results  Component Value Date   HGBA1C 5.5 03/21/2021   No results found for: PROLACTIN Lab Results  Component Value Date    CHOL 175 03/21/2021   TRIG 227 (H) 03/21/2021   HDL 44 03/21/2021   CHOLHDL 3.2 02/18/2019   VLDL 23 02/18/2019   LDLCALC 93 03/21/2021   LDLCALC 115 (H) 02/18/2019   Lab Results  Component Value Date   TSH 2.200 03/21/2021   TSH 0.58 09/24/2016    Therapeutic Level Labs: No results found for: LITHIUM No results found for: VALPROATE No components found for:  CBMZ  Current Medications: Current Outpatient Medications  Medication Sig Dispense Refill   venlafaxine XR (EFFEXOR-XR) 75 MG 24 hr capsule Take 3 capsules (225 mg total) by mouth daily with breakfast. 270 capsule 1   ALPRAZolam (XANAX) 1 MG tablet TAKE 1 TABLET BY MOUTH 3 TIMES DAILY AND 1/2 TABLET AS NEEDED FOR ANXIETY (MAX 3.'5MG'$  DAILY) 105 tablet 2   esomeprazole (NEXIUM) 40 MG capsule TAKE 1 CAPSULE (40 MG TOTAL) BY MOUTH DAILY. 30 capsule 2   fluticasone (FLONASE) 50 MCG/ACT nasal spray Place 2 sprays into both nostrils daily as needed for up to 14 days for allergies. 1 g 1   loratadine (CLARITIN) 10 MG tablet Take 1 tablet (10 mg total) by mouth daily as needed for allergies. 30 tablet 11   lurasidone (LATUDA) 40 MG TABS tablet TAKE 1 TABLET (40 MG TOTAL) BY MOUTH DAILY WITH BREAKFAST. 90 tablet 1   meloxicam (MOBIC) 15 MG tablet TAKE 1 TABLET BY MOUTH ONCE A DAY 30 tablet 3   metFORMIN (GLUCOPHAGE) 500 MG tablet Take 1 tablet (500 mg total) by mouth daily with breakfast. 30 tablet 0   mirtazapine (REMERON) 30 MG tablet Take 1 tablet (30 mg total) by mouth at bedtime. 90 tablet 1   Multiple Vitamin (MULTIVITAMIN) capsule Take 1 capsule by mouth daily.     omeprazole (PRILOSEC) 40 MG capsule TAKE 1 CAPSULE BY MOUTH DAILY. 30 capsule 2   propranolol (INDERAL) 40 MG tablet Take 40 mg by mouth daily as needed (heart palpitation.).      Vitamin D, Ergocalciferol, (DRISDOL) 1.25 MG (50000  UNIT) CAPS capsule Take 1 capsule (50,000 Units total) by mouth every 7 (seven) days. 4 capsule 0   zolpidem (AMBIEN) 10 MG tablet TAKE 1  TABLET BY MOUTH AT BEDTIME AS NEEDED FOR SLEEP 30 tablet 2   No current facility-administered medications for this visit.     Musculoskeletal: Strength & Muscle Tone:  N/A Gait & Station:  N/A Patient leans: N/A  Psychiatric Specialty Exam: Review of Systems  Psychiatric/Behavioral:  Positive for decreased concentration and dysphoric mood. Negative for agitation, behavioral problems, confusion, hallucinations, self-injury, sleep disturbance and suicidal ideas. The patient is nervous/anxious. The patient is not hyperactive.   All other systems reviewed and are negative.  There were no vitals taken for this visit.There is no height or weight on file to calculate BMI.  General Appearance: Fairly Groomed  Eye Contact:  Good  Speech:  Clear and Coherent  Volume:  Normal  Mood:  Depressed  Affect:  Appropriate, Congruent, and irritable  Thought Process:  Coherent  Orientation:  Full (Time, Place, and Person)  Thought Content: Logical   Suicidal Thoughts:  No  Homicidal Thoughts:  No  Memory:  Immediate;   Good  Judgement:  Good  Insight:  Fair  Psychomotor Activity:  Normal  Concentration:  Concentration: Good and Attention Span: Good  Recall:  Good  Fund of Knowledge: Good  Language: Good  Akathisia:  No  Handed:  Right  AIMS (if indicated): not done  Assets:  Communication Skills Desire for Improvement  ADL's:  Intact  Cognition: WNL  Sleep:  Good   Screenings: GAD-7    Flowsheet Row Office Visit from 04/22/2018 in [place] Office Visit from 06/03/2017 in [facility] Office Visit from 09/24/2016 in [place] [facility] Office Visit from 03/27/2016 in [facility]  Total GAD-7 Score '11 3 15 16      '$ PHQ2-9    Flowsheet Row Video Visit from 04/09/2021 in [facility] Office Visit from 03/21/2021 in [facility] Video  Visit from 11/07/2020 in [facility] Office Visit from 04/22/2018 in [facility] Office Visit from 06/03/2017 in [facility]  PHQ-2 Total Score '6 1 4 5 '$ 0  PHQ-9 Total Score '11 4 11 15 4      '$ Flowsheet Row Video Visit from 01/08/2021 in [facility] [place] No Risk        Assessment and Plan:  MALKY LECCESE is a 60 y.o. year old female with a history of depression, OCD, estrogen negative T2 pN1, stage IIB invasive ductal carcinoma, diagnosed 02/25/2018 s/p mastectomy 08/2018, neoadjuvant chemo immunotherapy/on trastzumab, asthma, OA, chronic back pain, who presents for follow up appointment for below.    1. MDD (major depressive disorder), recurrent episode, moderate ([place]) 2. Generalized anxiety disorder Exam is notable for irritability, and she reports depressive symptoms and anxiety since last visit without significant triggers.  Psychosocial stressors includes her medical condition, unemployment, chronic pain, and taking care of her mother with medical condition.  She is not interested in any medication adjustment, which can cause risk of weight gain.  Will continue venlafaxine, mirtazapine, Latuda to target depression.  Will continue Xanax as needed for anxiety.  Although she will greatly benefit from CBT, she is not interested in this option.   3. Insomnia, unspecified type She reports good benefit from  Ambien.  We will continue current dose to target insomnia.  Noted that although she has snoring, she is not interested in the evaluation for sleep.   # inattention She complains of worsening in concentration over the past few years before chemotherapy.  No history of IEP/ADHD.  The etiology is likely multifactorial given her ongoing mood symptoms.  She is not interested in a neuropsychological evaluation.    This clinician has discussed the side effect  associated with medication prescribed during this encounter. Please refer to notes in the previous encounters for more details.     Plan I have reviewed and updated plans as below  1. Continue venlafaxine 225 mg daily 2. Continue Latuda 40 mg, take at night - monitor weight gain 3. Continue mirtazapine 15 mg at night  4. Continue Xanax 1 mg three times a day (and additional 0.5 mg daily) as needed for anxiety    5. Continue Ambien 10 mg at night as needed for insomnia 6. Next appointment: 11/17 at 3 PM, video. She declined in person visit as she does not drive highway.    Past trials of medication: sertraline, fluoxetine, lexapro, Buspar, venlafaxine, duloxetine, bupropion, quetiapine, Abilify  I have utilized the [place] Controlled Substances Reporting System (PMP AWARxE) to confirm adherence regarding the patient's medication. My review reveals appropriate prescription fills.     The patient demonstrates the following risk factors for suicide: Chronic risk factors for suicide include: psychiatric disorder of depression and medical illness of breast cancer. Acute risk factors for suicide include: unemployment and loss (financial, interpersonal, professional). Protective factors for this patient include: positive social support, responsibility to others (children, family), coping skills and hope for the future. Considering these factors, the overall suicide risk at this point appears to be low. Patient is appropriate for outpatient follow up.      Norman Clay, MD 04/09/2021, 2:39 PM

## 2021-04-08 ENCOUNTER — Encounter (INDEPENDENT_AMBULATORY_CARE_PROVIDER_SITE_OTHER): Payer: Self-pay | Admitting: Bariatrics

## 2021-04-08 NOTE — Progress Notes (Signed)
Chief Complaint:   OBESITY Sammijo is here to discuss her progress with her obesity treatment plan along with follow-up of her obesity related diagnoses. Lolene is on the Category 3 Plan and states she is following her eating plan approximately 100% of the time. Kilyn states she is doing 0 minutes 0 times per week.  Today's visit was #: 3 Starting weight: 238 lbs Starting date: 03/21/2021 Today's weight: 231 lbs Today's date: 04/04/2021 Total lbs lost to date: 7 lbs Total lbs lost since last in-office visit: 7 lbs  Interim History: Zaiah is down 7 lbs since her 1st visit. She eats more when she is bored. She felt hungry throughout the day.  Subjective:   1. Vitamin D deficiency She is currently taking prescription vitamin D 50,000 IU each week. She denies nausea, vomiting or muscle weakness.  2. Insulin resistance [place] Insulin was 31.4. Her A1C was 5.5.  3. High triglycerides Kelcee will decrease sugar and starches.  Assessment/Plan:   1. Vitamin D deficiency Low Vitamin D level contributes to fatigue and are associated with obesity, breast, and colon cancer. We will refill prescription Vitamin D 50,000 IU every week for 1 month with no refills and Andreka will follow-up for routine testing of Vitamin D, at least 2-3 times per year to avoid over-replacement.  - Vitamin D, Ergocalciferol, (DRISDOL) 1.25 MG (50000 UNIT) CAPS capsule; Take 1 capsule (50,000 Units total) by mouth every 7 (seven) days.  Dispense: 4 capsule; Refill: 0  2. Insulin resistance Lacrystal will continue to work on weight loss, exercise, and decreasing simple carbohydrates to help decrease the risk of diabetes. A handout on Insulin resistance was given today. We will refill Metformin 500 mg for 1 month with no refills.Makyra agreed to follow-up with [place] as directed to closely monitor her progress.  - metFORMIN (GLUCOPHAGE) 500 MG tablet; Take 1 tablet (500 mg total) by mouth daily with breakfast.   Dispense: 30 tablet; Refill: 0  3. High triglycerides Cardiovascular risk and specific lipid/LDL goals reviewed.  Jeorgia will continue lowering carbohydrates. We discussed several lifestyle modifications today and Monetta will continue to work on diet, exercise and weight loss efforts. Orders and follow up as documented in patient record.   Counseling Intensive lifestyle modifications are the first line treatment for this issue. Dietary changes: Increase soluble fiber. Decrease simple carbohydrates. Exercise changes: Moderate to vigorous-intensity aerobic activity 150 minutes per week if tolerated. Lipid-lowering medications: see documented in medical record.   4. Obesity, current BMI 39.8 Shellyann is currently in the action stage of change. As such, her goal is to continue with weight loss efforts. She has agreed to the Category 3 Plan.   Sanjuana will continue meal planning. We reviewed Tirza's labs from 03/21/2021.  Exercise goals:  Pennie has left knee pain.  Behavioral modification strategies: increasing lean protein intake, decreasing simple carbohydrates, increasing vegetables, increasing water intake, decreasing eating out, no skipping meals, meal planning and cooking strategies, keeping healthy foods in the home, and planning for success.  Adolph has agreed to follow-up with our clinic in 2 weeks. She was informed of the importance of frequent follow-up visits to maximize her success with intensive lifestyle modifications for her multiple health conditions.   Objective:   Blood pressure 119/73, pulse 72, temperature 98.4 F (36.9 C), height '5\' 4"'$  (1.626 m), weight 231 lb (104.8 kg), SpO2 95 %. Body mass index is 39.65 kg/m.  General: Cooperative, alert, well developed, in no acute distress. HEENT: Conjunctivae  and lids unremarkable. Cardiovascular: Regular rhythm.  Lungs: Normal work of breathing. Neurologic: No focal deficits.   Lab Results  Component Value Date    CREATININE 0.96 08/15/2020   BUN 19 08/15/2020   NA 140 08/15/2020   K 3.8 08/15/2020   CL 106 08/15/2020   CO2 23 08/15/2020   Lab Results  Component Value Date   ALT 29 08/15/2020   AST 20 08/15/2020   ALKPHOS 83 08/15/2020   BILITOT 1.0 08/15/2020   Lab Results  Component Value Date   HGBA1C 5.5 03/21/2021   HGBA1C 5.4 04/27/2014   Lab Results  Component Value Date   INSULIN 31.4 (H) 03/21/2021   Lab Results  Component Value Date   TSH 2.200 03/21/2021   Lab Results  Component Value Date   CHOL 175 03/21/2021   HDL 44 03/21/2021   LDLCALC 93 03/21/2021   TRIG 227 (H) 03/21/2021   CHOLHDL 3.2 02/18/2019   Lab Results  Component Value Date   VD25OH 14.4 (L) 03/21/2021   Lab Results  Component Value Date   WBC 6.7 08/15/2020   HGB 13.0 08/15/2020   HCT 39.8 08/15/2020   MCV 89.0 08/15/2020   PLT 221 08/15/2020   No results found for: IRON, TIBC, FERRITIN  Obesity Behavioral Intervention:   Approximately 15 minutes were spent on the discussion below.  ASK: We discussed the diagnosis of obesity with Karmina today and Zilah agreed to give [place] permission to discuss obesity behavioral modification therapy today.  ASSESS: Jaelen has the diagnosis of obesity and her BMI today is 39.8. TRUE is in the action stage of change.   ADVISE: Jerlean was educated on the multiple health risks of obesity as well as the benefit of weight loss to improve her health. She was advised of the need for long term treatment and the importance of lifestyle modifications to improve her current health and to decrease her risk of future health problems.  AGREE: Multiple dietary modification options and treatment options were discussed and Mariadejesus agreed to follow the recommendations documented in the above note.  ARRANGE: Latifha was educated on the importance of frequent visits to treat obesity as outlined per CMS and USPSTF guidelines and agreed to schedule her next follow up  appointment today.  Attestation Statements:   Reviewed by clinician on day of visit: allergies, medications, problem list, medical history, surgical history, family history, social history, and previous encounter notes.  I, Lizbeth Bark, RMA, am acting as Location manager for [employer], DO.   I have reviewed the above documentation for accuracy and completeness, and I agree with the above. Jearld Lesch, DO

## 2021-04-09 ENCOUNTER — Telehealth (INDEPENDENT_AMBULATORY_CARE_PROVIDER_SITE_OTHER): Payer: Medicare Other | Admitting: Psychiatry

## 2021-04-09 ENCOUNTER — Other Ambulatory Visit (HOSPITAL_COMMUNITY): Payer: Self-pay

## 2021-04-09 ENCOUNTER — Other Ambulatory Visit: Payer: Self-pay

## 2021-04-09 ENCOUNTER — Encounter: Payer: Self-pay | Admitting: Psychiatry

## 2021-04-09 DIAGNOSIS — F331 Major depressive disorder, recurrent, moderate: Secondary | ICD-10-CM

## 2021-04-09 DIAGNOSIS — G4700 Insomnia, unspecified: Secondary | ICD-10-CM | POA: Diagnosis not present

## 2021-04-09 DIAGNOSIS — F411 Generalized anxiety disorder: Secondary | ICD-10-CM

## 2021-04-09 MED ORDER — MIRTAZAPINE 30 MG PO TABS
30.0000 mg | ORAL_TABLET | Freq: Every day | ORAL | 1 refills | Status: DC
  Filled 2021-04-09: qty 90, 90d supply, fill #0
  Filled 2021-05-24 – 2021-07-13 (×2): qty 90, 90d supply, fill #1

## 2021-04-09 MED ORDER — ALPRAZOLAM 1 MG PO TABS
ORAL_TABLET | ORAL | 2 refills | Status: DC
  Filled 2021-04-09: qty 105, 30d supply, fill #0
  Filled 2021-05-11: qty 105, 30d supply, fill #1
  Filled 2021-06-10: qty 105, 30d supply, fill #2

## 2021-04-09 MED ORDER — VENLAFAXINE HCL ER 75 MG PO CP24
225.0000 mg | ORAL_CAPSULE | Freq: Every day | ORAL | 1 refills | Status: DC
  Filled 2021-04-09: qty 270, 90d supply, fill #0
  Filled 2021-07-13: qty 270, 90d supply, fill #1

## 2021-04-09 MED ORDER — ZOLPIDEM TARTRATE 10 MG PO TABS
ORAL_TABLET | Freq: Every evening | ORAL | 2 refills | Status: DC | PRN
  Filled 2021-04-09: qty 30, 30d supply, fill #0
  Filled 2021-05-11: qty 30, 30d supply, fill #1
  Filled 2021-06-10: qty 30, 30d supply, fill #2

## 2021-04-09 NOTE — Patient Instructions (Signed)
1. Continue venlafaxine 225 mg daily 2. Continue Latuda 40 mg, take at night  3. Continue mirtazapine 15 mg at night  4. Continue Xanax 1 mg three times a day (and additional 0.5 mg daily) as needed for anxiety    5. Continue Ambien 10 mg at night as needed for insomnia 6. Next appointment: 11/17 at 3 PM

## 2021-04-10 ENCOUNTER — Other Ambulatory Visit (HOSPITAL_COMMUNITY): Payer: Self-pay

## 2021-04-10 DIAGNOSIS — M545 Low back pain, unspecified: Secondary | ICD-10-CM | POA: Diagnosis not present

## 2021-04-11 ENCOUNTER — Other Ambulatory Visit (HOSPITAL_COMMUNITY): Payer: Self-pay

## 2021-04-12 ENCOUNTER — Encounter: Payer: Self-pay | Admitting: Oncology

## 2021-04-12 ENCOUNTER — Other Ambulatory Visit (HOSPITAL_COMMUNITY): Payer: Self-pay

## 2021-04-12 ENCOUNTER — Encounter: Payer: Self-pay | Admitting: Adult Health

## 2021-04-25 ENCOUNTER — Encounter (INDEPENDENT_AMBULATORY_CARE_PROVIDER_SITE_OTHER): Payer: Self-pay | Admitting: Adult Health

## 2021-04-25 ENCOUNTER — Other Ambulatory Visit (HOSPITAL_COMMUNITY): Payer: Self-pay

## 2021-04-25 ENCOUNTER — Ambulatory Visit (INDEPENDENT_AMBULATORY_CARE_PROVIDER_SITE_OTHER): Payer: Medicare Other | Admitting: Adult Health

## 2021-04-25 ENCOUNTER — Other Ambulatory Visit: Payer: Self-pay

## 2021-04-25 VITALS — BP 110/75 | HR 70 | Temp 98.2°F | Ht 64.0 in | Wt 228.0 lb

## 2021-04-25 DIAGNOSIS — Z6841 Body Mass Index (BMI) 40.0 and over, adult: Secondary | ICD-10-CM | POA: Diagnosis not present

## 2021-04-25 DIAGNOSIS — E559 Vitamin D deficiency, unspecified: Secondary | ICD-10-CM

## 2021-04-25 DIAGNOSIS — K5900 Constipation, unspecified: Secondary | ICD-10-CM

## 2021-04-25 DIAGNOSIS — E8881 Metabolic syndrome: Secondary | ICD-10-CM | POA: Diagnosis not present

## 2021-04-25 MED ORDER — VITAMIN D (ERGOCALCIFEROL) 1.25 MG (50000 UNIT) PO CAPS
50000.0000 [IU] | ORAL_CAPSULE | ORAL | 0 refills | Status: DC
  Filled 2021-04-25 – 2021-05-01 (×2): qty 4, 28d supply, fill #0

## 2021-04-25 MED ORDER — METFORMIN HCL 500 MG PO TABS
ORAL_TABLET | ORAL | 0 refills | Status: DC
  Filled 2021-04-25: qty 30, 30d supply, fill #0

## 2021-04-26 DIAGNOSIS — M545 Low back pain, unspecified: Secondary | ICD-10-CM | POA: Diagnosis not present

## 2021-04-26 DIAGNOSIS — E8881 Metabolic syndrome: Secondary | ICD-10-CM | POA: Insufficient documentation

## 2021-04-26 DIAGNOSIS — K5900 Constipation, unspecified: Secondary | ICD-10-CM | POA: Insufficient documentation

## 2021-04-26 NOTE — Progress Notes (Signed)
Chief Complaint:   OBESITY Jacqueline Good is here to discuss her progress with her obesity treatment plan along with follow-up of her obesity related diagnoses. Jacqueline Good is on the Category 3 Plan and states she is following her eating plan approximately 100% of the time. Jacqueline Good states she is not exercising regularly.  Today's visit was #: 4 Starting weight: 238 lbs Starting date: 03/21/2021 Today's weight: 228 lbs Today's date: 04/25/2021 Total lbs lost to date: 10 lbs Total lbs lost since last in-office visit: 3 lbs  Interim History: Jacqueline Good has eliminated fried foods and increased fruits/vegetable intake along with following Category 3 meal plan. She has experienced constipation the last several weeks.  Subjective:   1. Vitamin D deficiency On 03/21/2021, vitamin D level - 14.4 - well below goal of 50. She is currently taking prescription ergocalciferol 50,000 IU each week. She denies nausea, vomiting or muscle weakness.  Lab Results  Component Value Date   VD25OH 14.4 (L) 03/21/2021   2. Insulin resistance She is on metformin 500 mg with breakfast - started on 04/04/2021. She is experiencing early afternoon/early polyphagia.  Lab Results  Component Value Date   INSULIN 31.4 (H) 03/21/2021   Lab Results  Component Value Date   HGBA1C 5.5 03/21/2021   3. Constipation, unspecified constipation type Normal bowel habits - 1-2 BM/day. She denies abdominal pain, hematochezia, family history of colon cancer.  Assessment/Plan:   1. Vitamin D deficiency Refill ergocalciferol 50,000 IU once weekly, as per below.  - Refill Vitamin D, Ergocalciferol, (DRISDOL) 1.25 MG (50000 UNIT) CAPS capsule; Take 1 capsule (50,000 Units total) by mouth every 7 (seven) days.  Dispense: 4 capsule; Refill: 0  2. Insulin resistance Change metformin dose from breakfast to lunch. Refill metformin 500 mg at lunch, as per below.  - Refill metFORMIN (GLUCOPHAGE) 500 MG tablet; Take 1 tablet by mouth  with lunch once daily  Dispense: 30 tablet; Refill: 0  3. Constipation, unspecified constipation type Increase water, walk after larges meal of the day, OTC MiraLAX.  4. Obesity, current BMI 39.3  Jacqueline Good is currently in the action stage of change. As such, her goal is to continue with weight loss efforts. She has agreed to the Category 3 Plan.   Exercise goals:  Increase stretching as tolerated.  Behavioral modification strategies: increasing lean protein intake, decreasing simple carbohydrates, meal planning and cooking strategies, keeping healthy foods in the home, and planning for success.  Handouts:  High Protein/Low Calorie Food List.  Protein Content of Foods.  Jacqueline Good has agreed to follow-up with our clinic in 2 weeks. She was informed of the importance of frequent follow-up visits to maximize her success with intensive lifestyle modifications for her multiple health conditions.   Objective:   Blood pressure 110/75, pulse 70, temperature 98.2 F (36.8 C), height '5\' 4"'$  (1.626 m), weight 228 lb (103.4 kg), SpO2 97 %. Body mass index is 39.14 kg/m.  General: Cooperative, alert, well developed, in no acute distress. HEENT: Conjunctivae and lids unremarkable. Cardiovascular: Regular rhythm.  Lungs: Normal work of breathing. Neurologic: No focal deficits.   Lab Results  Component Value Date   CREATININE 0.96 08/15/2020   BUN 19 08/15/2020   NA 140 08/15/2020   K 3.8 08/15/2020   CL 106 08/15/2020   CO2 23 08/15/2020   Lab Results  Component Value Date   ALT 29 08/15/2020   AST 20 08/15/2020   ALKPHOS 83 08/15/2020   BILITOT 1.0 08/15/2020   Lab Results  Component Value Date   HGBA1C 5.5 03/21/2021   HGBA1C 5.4 04/27/2014   Lab Results  Component Value Date   INSULIN 31.4 (H) 03/21/2021   Lab Results  Component Value Date   TSH 2.200 03/21/2021   Lab Results  Component Value Date   CHOL 175 03/21/2021   HDL 44 03/21/2021   LDLCALC 93 03/21/2021   TRIG  227 (H) 03/21/2021   CHOLHDL 3.2 02/18/2019   Lab Results  Component Value Date   VD25OH 14.4 (L) 03/21/2021   Lab Results  Component Value Date   WBC 6.7 08/15/2020   HGB 13.0 08/15/2020   HCT 39.8 08/15/2020   MCV 89.0 08/15/2020   PLT 221 08/15/2020   Obesity Behavioral Intervention:   Approximately 15 minutes were spent on the discussion below.  ASK: We discussed the diagnosis of obesity with [name] today and [name] agreed to give [place] permission to discuss obesity behavioral modification therapy today.  ASSESS: [name] has the diagnosis of obesity and her BMI today is 39.3. [name] is in the action stage of change.   ADVISE: [name] was educated on the multiple health risks of obesity as well as the benefit of weight loss to improve her health. She was advised of the need for long term treatment and the importance of lifestyle modifications to improve her current health and to decrease her risk of future health problems.  AGREE: Multiple dietary modification options and treatment options were discussed and Jacqueline Good agreed to follow the recommendations documented in the above note.  ARRANGE: Jacqueline Good was educated on the importance of frequent visits to treat obesity as outlined per CMS and USPSTF guidelines and agreed to schedule her next follow up appointment today.  Attestation Statements:   Reviewed by clinician on day of visit: allergies, medications, problem list, medical history, surgical history, family history, social history, and previous encounter notes.  I, Water quality scientist, CMA, am acting as Location manager for Mina Marble, NP.  I have reviewed the above documentation for accuracy and completeness, and I agree with the above. -  Clela Hagadorn d. Shakeel Disney, NP-C

## 2021-04-27 ENCOUNTER — Other Ambulatory Visit (HOSPITAL_COMMUNITY): Payer: Self-pay

## 2021-04-29 ENCOUNTER — Other Ambulatory Visit (HOSPITAL_COMMUNITY): Payer: Self-pay

## 2021-05-01 ENCOUNTER — Encounter: Payer: Self-pay | Admitting: Oncology

## 2021-05-01 ENCOUNTER — Other Ambulatory Visit (HOSPITAL_COMMUNITY): Payer: Self-pay

## 2021-05-01 ENCOUNTER — Encounter: Payer: Self-pay | Admitting: Adult Health

## 2021-05-06 ENCOUNTER — Other Ambulatory Visit (HOSPITAL_COMMUNITY): Payer: Self-pay

## 2021-05-06 ENCOUNTER — Ambulatory Visit (INDEPENDENT_AMBULATORY_CARE_PROVIDER_SITE_OTHER): Payer: Medicare Other | Admitting: Family Medicine

## 2021-05-06 ENCOUNTER — Encounter (INDEPENDENT_AMBULATORY_CARE_PROVIDER_SITE_OTHER): Payer: Self-pay | Admitting: Family Medicine

## 2021-05-06 ENCOUNTER — Other Ambulatory Visit: Payer: Self-pay

## 2021-05-06 VITALS — BP 119/81 | HR 61 | Temp 97.8°F | Ht 64.0 in | Wt 226.0 lb

## 2021-05-06 DIAGNOSIS — K5909 Other constipation: Secondary | ICD-10-CM | POA: Diagnosis not present

## 2021-05-06 DIAGNOSIS — Z9189 Other specified personal risk factors, not elsewhere classified: Secondary | ICD-10-CM

## 2021-05-06 DIAGNOSIS — E8881 Metabolic syndrome: Secondary | ICD-10-CM

## 2021-05-06 DIAGNOSIS — Z6841 Body Mass Index (BMI) 40.0 and over, adult: Secondary | ICD-10-CM

## 2021-05-06 DIAGNOSIS — E559 Vitamin D deficiency, unspecified: Secondary | ICD-10-CM | POA: Diagnosis not present

## 2021-05-06 MED ORDER — METFORMIN HCL 500 MG PO TABS
ORAL_TABLET | ORAL | 0 refills | Status: DC
  Filled 2021-05-06: qty 30, 30d supply, fill #0

## 2021-05-06 MED ORDER — VITAMIN D (ERGOCALCIFEROL) 1.25 MG (50000 UNIT) PO CAPS
50000.0000 [IU] | ORAL_CAPSULE | ORAL | 0 refills | Status: DC
  Filled 2021-05-06: qty 4, 28d supply, fill #0

## 2021-05-07 NOTE — Progress Notes (Signed)
Chief Complaint:   OBESITY Jacqueline Good is here to discuss her progress with her obesity treatment plan along with follow-up of her obesity related diagnoses. Jacqueline Good is on the Category 3 Plan and states she is following her eating plan approximately 60% of the time. Jacqueline Good states she is not currently exercising.  Today's visit was #: 5 Starting weight: 238 lbs Starting date: 03/21/2021 Today's weight: 226 lbs Today's date: 05/06/2021 Total lbs lost to date: 12 Total lbs lost since last in-office visit: 2  Interim History: Jacqueline Good is skipping breakfast at times. She is not weighing proteins and eats 4 oz at night. She sometimes only has a protein shake for lunch. Pt reports hunger and cravings at different times and just some days.  Subjective:   1. Insulin resistance Jacqueline Good has a diagnosis of insulin resistance based on her elevated fasting insulin level >5. She continues to work on diet and exercise to decrease her risk of diabetes.  2. Vitamin D deficiency She is currently taking prescription vitamin D 50,000 IU each week. She denies nausea, vomiting or muscle weakness.  3. Other constipation Jacqueline Good's symptoms are better with Miralax, as suggested at last OV. Water intake was not increased and is still at 60 oz per day.  4. At risk for deficient intake of food Jacqueline Good is at risk for deficient intake of food due to inadequate intake.  Assessment/Plan:  No orders of the defined types were placed in this encounter.   Medications Discontinued During This Encounter  Medication Reason   fluticasone (FLONASE) 50 MCG/ACT nasal spray Error   Vitamin D, Ergocalciferol, (DRISDOL) 1.25 MG (50000 UNIT) CAPS capsule Reorder   metFORMIN (GLUCOPHAGE) 500 MG tablet Reorder     Meds ordered this encounter  Medications   metFORMIN (GLUCOPHAGE) 500 MG tablet    Sig: Take 1 tablet by mouth with lunch once daily    Dispense:  30 tablet    Refill:  0   Vitamin D, Ergocalciferol,  (DRISDOL) 1.25 MG (50000 UNIT) CAPS capsule    Sig: Take 1 capsule (50,000 Units total) by mouth every 7 (seven) days.    Dispense:  4 capsule    Refill:  0     1. Insulin resistance Jacqueline Good will continue to work on weight loss, exercise, and decreasing simple carbohydrates to help decrease the risk of diabetes. Jacqueline Good agreed to follow-up with [place] as directed to closely monitor her progress. Continue Metformin as directed.  Refill- metFORMIN (GLUCOPHAGE) 500 MG tablet; Take 1 tablet by mouth with lunch once daily  Dispense: 30 tablet; Refill: 0  2. Vitamin D deficiency Low Vitamin D level contributes to fatigue and are associated with obesity, breast, and colon cancer. She agrees to continue to take prescription Vitamin D 50,000 IU every week and will follow-up for routine testing of Vitamin D, at least 2-3 times per year to avoid over-replacement.  Refill- Vitamin D, Ergocalciferol, (DRISDOL) 1.25 MG (50000 UNIT) CAPS capsule; Take 1 capsule (50,000 Units total) by mouth every 7 (seven) days.  Dispense: 4 capsule; Refill: 0  3. Other constipation Jacqueline Good was informed that a decrease in bowel movement frequency is normal while losing weight, but stools should not be hard or painful. Orders and follow up as documented in patient record. Continue Miralax prn, increase water intake, and walk a little. Counseling provided.   Counseling Getting to Good Bowel Health: Your goal is to have one soft bowel movement each day. Drink at least 8 glasses of water  each day. Eat plenty of fiber (goal is over 25 grams each day). It is best to get most of your fiber from dietary sources which includes leafy green vegetables, fresh fruit, and whole grains. You may need to add fiber with the help of OTC fiber supplements. These include Metamucil, Citrucel, and Flaxseed. If you are still having trouble, try adding Miralax or Magnesium Citrate. If all of these changes do not work, Cabin crew.  4. At risk  for deficient intake of food Jacqueline Good was given approximately 10 minutes of deficit intake of food prevention counseling today. Jacqueline Good is at risk for eating too few calories based on current food recall. She was encouraged to focus on meeting caloric and protein goals according to her recommended meal plan.    5. Obesity with current BMI of 38.8  Jacqueline Good is currently in the action stage of change. As such, her goal is to continue with weight loss efforts. She has agreed to the Category 3 Plan with protein options.   Exercise goals: All adults should avoid inactivity. Some physical activity is better than none, and adults who participate in any amount of physical activity gain some health benefits. Go on gentle walks to help with constipation.  Behavioral modification strategies: increasing lean protein intake, decreasing simple carbohydrates, increasing water intake, no skipping meals, and planning for success.  Jacqueline Good has agreed to follow-up with our clinic in 2 weeks with Dr. Owens Shark or NP Valetta Fuller. She was informed of the importance of frequent follow-up visits to maximize her success with intensive lifestyle modifications for her multiple health conditions.   Objective:   Blood pressure 119/81, pulse 61, temperature 97.8 F (36.6 C), height 5\' 4"  (1.626 m), weight 226 lb (102.5 kg), SpO2 98 %. Body mass index is 38.79 kg/m.  General: Cooperative, alert, well developed, in no acute distress. HEENT: Conjunctivae and lids unremarkable. Cardiovascular: Regular rhythm.  Lungs: Normal work of breathing. Neurologic: No focal deficits.   Lab Results  Component Value Date   CREATININE 0.96 08/15/2020   BUN 19 08/15/2020   NA 140 08/15/2020   K 3.8 08/15/2020   CL 106 08/15/2020   CO2 23 08/15/2020   Lab Results  Component Value Date   ALT 29 08/15/2020   AST 20 08/15/2020   ALKPHOS 83 08/15/2020   BILITOT 1.0 08/15/2020   Lab Results  Component Value Date   HGBA1C 5.5 03/21/2021    HGBA1C 5.4 04/27/2014   Lab Results  Component Value Date   INSULIN 31.4 (H) 03/21/2021   Lab Results  Component Value Date   TSH 2.200 03/21/2021   Lab Results  Component Value Date   CHOL 175 03/21/2021   HDL 44 03/21/2021   LDLCALC 93 03/21/2021   TRIG 227 (H) 03/21/2021   CHOLHDL 3.2 02/18/2019   Lab Results  Component Value Date   VD25OH 14.4 (L) 03/21/2021   Lab Results  Component Value Date   WBC 6.7 08/15/2020   HGB 13.0 08/15/2020   HCT 39.8 08/15/2020   MCV 89.0 08/15/2020   PLT 221 08/15/2020    Attestation Statements:   Reviewed by clinician on day of visit: allergies, medications, problem list, medical history, surgical history, family history, social history, and previous encounter notes.  Coral Ceo, CMA, am acting as transcriptionist for [employer], DO.  I have reviewed the above documentation for accuracy and completeness, and I agree with the above. Marjory Sneddon, D.O.  The [place]  Act was signed into law in 2016 which includes the topic of electronic health records.  This provides immediate access to information in MyChart.  This includes consultation notes, operative notes, office notes, lab results and pathology reports.  If you have any questions about what you read please let us know at your next visit so we can discuss your concerns and take corrective action if need be.  We are right here with you.

## 2021-05-11 ENCOUNTER — Other Ambulatory Visit (HOSPITAL_COMMUNITY): Payer: Self-pay

## 2021-05-13 ENCOUNTER — Encounter: Payer: Self-pay | Admitting: Adult Health

## 2021-05-13 ENCOUNTER — Encounter: Payer: Self-pay | Admitting: Oncology

## 2021-05-21 ENCOUNTER — Ambulatory Visit (INDEPENDENT_AMBULATORY_CARE_PROVIDER_SITE_OTHER): Payer: Medicare Other | Admitting: Bariatrics

## 2021-05-21 ENCOUNTER — Other Ambulatory Visit (HOSPITAL_COMMUNITY): Payer: Self-pay

## 2021-05-21 ENCOUNTER — Other Ambulatory Visit: Payer: Self-pay

## 2021-05-21 ENCOUNTER — Encounter (INDEPENDENT_AMBULATORY_CARE_PROVIDER_SITE_OTHER): Payer: Self-pay | Admitting: Bariatrics

## 2021-05-21 VITALS — BP 108/74 | HR 66 | Temp 98.6°F | Ht 64.0 in | Wt 225.0 lb

## 2021-05-21 DIAGNOSIS — E559 Vitamin D deficiency, unspecified: Secondary | ICD-10-CM | POA: Diagnosis not present

## 2021-05-21 DIAGNOSIS — Z6841 Body Mass Index (BMI) 40.0 and over, adult: Secondary | ICD-10-CM | POA: Diagnosis not present

## 2021-05-21 DIAGNOSIS — E8881 Metabolic syndrome: Secondary | ICD-10-CM

## 2021-05-21 MED ORDER — VITAMIN D (ERGOCALCIFEROL) 1.25 MG (50000 UNIT) PO CAPS
50000.0000 [IU] | ORAL_CAPSULE | ORAL | 0 refills | Status: DC
  Filled 2021-05-21 – 2021-05-24 (×2): qty 4, 28d supply, fill #0

## 2021-05-21 MED ORDER — METFORMIN HCL 500 MG PO TABS
ORAL_TABLET | ORAL | 0 refills | Status: DC
  Filled 2021-05-21: qty 30, 30d supply, fill #0

## 2021-05-21 NOTE — Progress Notes (Signed)
Chief Complaint:   OBESITY Laloni is here to discuss her progress with her obesity treatment plan along with follow-up of her obesity related diagnoses. Henna is on the Category 3 Plan and states she is following her eating plan approximately 80% of the time. Emie states she is doing 0 minutes 0 times per week.  Today's visit was #: 6 Starting weight: 238 lbs Starting date: 03/21/2021 Today's weight: 225 lbs Today's date: 05/21/2021 Total lbs lost to date: 13 lbs Total lbs lost since last in-office visit: 1 lb  Interim History: Jihan is down 1 lb since her last visit but she thought that she had lost more weight. She has cut back on her water.  Subjective:   1. Vitamin D deficiency She is currently taking prescription vitamin D 50,000 IU each week. She denies nausea, vomiting or muscle weakness.  2. Insulin resistance Raena is taking as directed.  Assessment/Plan:   1. Vitamin D deficiency Low Vitamin D level contributes to fatigue and are associated with obesity, breast, and colon cancer. We will refill prescription Vitamin D 50,000 IU every week for 1 month with no refills and Racquelle will follow-up for routine testing of Vitamin D, at least 2-3 times per year to avoid over-replacement.  - Vitamin D, Ergocalciferol, (DRISDOL) 1.25 MG (50000 UNIT) CAPS capsule; Take 1 capsule (50,000 Units total) by mouth every 7 (seven) days.  Dispense: 4 capsule; Refill: 0  2. Insulin resistance Tyesha will continue to work on weight loss, exercise, and decreasing simple carbohydrates to help decrease the risk of diabetes. We will refill Metformin 500 mg for 1 month with no refills. Natalyn agreed to follow-up with [place] as directed to closely monitor her progress.  - metFORMIN (GLUCOPHAGE) 500 MG tablet; Take 1 tablet by mouth with lunch once daily  Dispense: 30 tablet; Refill: 0  3. Obesity with current BMI of 38.7 Jenavee is currently in the action stage of change. As such,  her goal is to continue with weight loss efforts. She has agreed to the Category 2 Plan and keeping a food journal and adhering to recommended goals of 1200 calories and 80-90 grams of protein.   Therisa will continue meal planning and mindful eating. She was given a  handout on [employer]  today.  Exercise goals:  Donnell will continue physical therapy exercise when she can.  Behavioral modification strategies: increasing lean protein intake, decreasing simple carbohydrates, increasing vegetables, increasing water intake, decreasing eating out, no skipping meals, meal planning and cooking strategies, keeping healthy foods in the home, and planning for success.  Tahnee has agreed to follow-up with our clinic in 2-3 weeks. She was informed of the importance of frequent follow-up visits to maximize her success with intensive lifestyle modifications for her multiple health conditions.   Objective:   Blood pressure 108/74, pulse 66, temperature 98.6 F (37 C), height 5\' 4"  (1.626 m), weight 225 lb (102.1 kg), SpO2 99 %. Body mass index is 38.62 kg/m.  General: Cooperative, alert, well developed, in no acute distress. HEENT: Conjunctivae and lids unremarkable. Cardiovascular: Regular rhythm.  Lungs: Normal work of breathing. Neurologic: No focal deficits.   Lab Results  Component Value Date   CREATININE 0.96 08/15/2020   BUN 19 08/15/2020   NA 140 08/15/2020   K 3.8 08/15/2020   CL 106 08/15/2020   CO2 23 08/15/2020   Lab Results  Component Value Date   ALT 29 08/15/2020   AST 20 08/15/2020  ALKPHOS 83 08/15/2020   BILITOT 1.0 08/15/2020   Lab Results  Component Value Date   HGBA1C 5.5 03/21/2021   HGBA1C 5.4 04/27/2014   Lab Results  Component Value Date   INSULIN 31.4 (H) 03/21/2021   Lab Results  Component Value Date   TSH 2.200 03/21/2021   Lab Results  Component Value Date   CHOL 175 03/21/2021   HDL 44 03/21/2021   LDLCALC 93 03/21/2021   TRIG 227  (H) 03/21/2021   CHOLHDL 3.2 02/18/2019   Lab Results  Component Value Date   VD25OH 14.4 (L) 03/21/2021   Lab Results  Component Value Date   WBC 6.7 08/15/2020   HGB 13.0 08/15/2020   HCT 39.8 08/15/2020   MCV 89.0 08/15/2020   PLT 221 08/15/2020   No results found for: IRON, TIBC, FERRITIN  Obesity Behavioral Intervention:   Approximately 15 minutes were spent on the discussion below.  ASK: We discussed the diagnosis of obesity with Jahmiya today and Zyiah agreed to give [place] permission to discuss obesity behavioral modification therapy today.  ASSESS: Caretha has the diagnosis of obesity and her BMI today is 38.7. Alasha is in the action stage of change.   ADVISE: Kadince was educated on the multiple health risks of obesity as well as the benefit of weight loss to improve her health. She was advised of the need for long term treatment and the importance of lifestyle modifications to improve her current health and to decrease her risk of future health problems.  AGREE: Multiple dietary modification options and treatment options were discussed and Lauralye agreed to follow the recommendations documented in the above note.  ARRANGE: Kamoria was educated on the importance of frequent visits to treat obesity as outlined per CMS and USPSTF guidelines and agreed to schedule her next follow up appointment today.  Attestation Statements:   Reviewed by clinician on day of visit: allergies, medications, problem list, medical history, surgical history, family history, social history, and previous encounter notes.  I, Lizbeth Bark, RMA, am acting as Location manager for [employer], DO.   I have reviewed the above documentation for accuracy and completeness, and I agree with the above. Jearld Lesch, DO

## 2021-05-23 ENCOUNTER — Encounter (INDEPENDENT_AMBULATORY_CARE_PROVIDER_SITE_OTHER): Payer: Self-pay | Admitting: Bariatrics

## 2021-05-24 ENCOUNTER — Encounter: Payer: Self-pay | Admitting: Oncology

## 2021-05-24 ENCOUNTER — Other Ambulatory Visit (HOSPITAL_COMMUNITY): Payer: Self-pay

## 2021-05-24 ENCOUNTER — Encounter: Payer: Self-pay | Admitting: Adult Health

## 2021-06-07 ENCOUNTER — Other Ambulatory Visit: Payer: Self-pay | Admitting: Oncology

## 2021-06-07 DIAGNOSIS — Z1231 Encounter for screening mammogram for malignant neoplasm of breast: Secondary | ICD-10-CM

## 2021-06-10 ENCOUNTER — Ambulatory Visit (INDEPENDENT_AMBULATORY_CARE_PROVIDER_SITE_OTHER): Payer: Medicare Other | Admitting: Bariatrics

## 2021-06-10 ENCOUNTER — Other Ambulatory Visit (HOSPITAL_COMMUNITY): Payer: Self-pay

## 2021-06-10 ENCOUNTER — Other Ambulatory Visit: Payer: Self-pay | Admitting: Oncology

## 2021-06-10 MED ORDER — ESOMEPRAZOLE MAGNESIUM 40 MG PO CPDR
DELAYED_RELEASE_CAPSULE | Freq: Every day | ORAL | 2 refills | Status: DC
  Filled 2021-06-10: qty 30, 30d supply, fill #0
  Filled 2021-12-14: qty 30, 30d supply, fill #1

## 2021-06-18 ENCOUNTER — Encounter: Payer: Self-pay | Admitting: Adult Health

## 2021-06-18 ENCOUNTER — Encounter: Payer: Self-pay | Admitting: Oncology

## 2021-06-19 ENCOUNTER — Other Ambulatory Visit: Payer: Self-pay

## 2021-06-19 ENCOUNTER — Encounter: Payer: Self-pay | Admitting: Adult Health

## 2021-06-19 ENCOUNTER — Ambulatory Visit (INDEPENDENT_AMBULATORY_CARE_PROVIDER_SITE_OTHER): Payer: Medicare Other | Admitting: Bariatrics

## 2021-06-19 ENCOUNTER — Encounter: Payer: Self-pay | Admitting: Oncology

## 2021-06-19 ENCOUNTER — Encounter (INDEPENDENT_AMBULATORY_CARE_PROVIDER_SITE_OTHER): Payer: Self-pay | Admitting: Bariatrics

## 2021-06-19 ENCOUNTER — Other Ambulatory Visit (HOSPITAL_COMMUNITY): Payer: Self-pay

## 2021-06-19 VITALS — BP 119/78 | HR 72 | Temp 98.0°F | Ht 64.0 in | Wt 225.0 lb

## 2021-06-19 DIAGNOSIS — E559 Vitamin D deficiency, unspecified: Secondary | ICD-10-CM | POA: Diagnosis not present

## 2021-06-19 DIAGNOSIS — E8881 Metabolic syndrome: Secondary | ICD-10-CM

## 2021-06-19 DIAGNOSIS — F5089 Other specified eating disorder: Secondary | ICD-10-CM | POA: Diagnosis not present

## 2021-06-19 DIAGNOSIS — Z6841 Body Mass Index (BMI) 40.0 and over, adult: Secondary | ICD-10-CM

## 2021-06-19 MED ORDER — BUPROPION HCL ER (SR) 150 MG PO TB12
150.0000 mg | ORAL_TABLET | Freq: Every day | ORAL | 0 refills | Status: DC
  Filled 2021-06-19: qty 30, 30d supply, fill #0

## 2021-06-19 MED ORDER — METFORMIN HCL 500 MG PO TABS
ORAL_TABLET | ORAL | 0 refills | Status: DC
  Filled 2021-06-19: qty 30, 30d supply, fill #0

## 2021-06-19 MED ORDER — VITAMIN D (ERGOCALCIFEROL) 1.25 MG (50000 UNIT) PO CAPS
50000.0000 [IU] | ORAL_CAPSULE | ORAL | 0 refills | Status: DC
  Filled 2021-06-19: qty 4, 28d supply, fill #0

## 2021-06-19 NOTE — Progress Notes (Signed)
Chief Complaint:   OBESITY Jacqueline Good is here to discuss her progress with her obesity treatment plan along with follow-up of her obesity related diagnoses. Jacqueline Good is on the Category 3 Plan and states she is following her eating plan approximately 50% of the time. Jacqueline Good states she is doing 0 minutes 0 times per week.  Today's visit was #: 7 Starting weight: 238 lbs Starting date: 03/21/2021 Today's weight: 225 lbs Today's date: 06/19/2021 Total lbs lost to date: 13 lbs Total lbs lost since last in-office visit: 0  Interim History: Jacqueline Good weight remains the same since her last visit. She just came off a cruise and did not gain water.   Subjective:   1. Insulin resistance Jacqueline Good is taking Metformin as directed.  2. Vitamin D deficiency Jacqueline Good is taking Vitamin D as directed.   3. Other disorder of eating Jacqueline Good notes night time eating.  Assessment/Plan:   1. Insulin resistance We will refill Metformin with lunch daily for 1 month with no refills. Jacqueline Good will continue to work on weight loss, exercise, and decreasing simple carbohydrates to help decrease the risk of diabetes. Jacqueline Good agreed to follow-up with [place] as directed to closely monitor her progress.  - metFORMIN (GLUCOPHAGE) 500 MG tablet; Take 1 tablet by mouth with lunch daily  Dispense: 30 tablet; Refill: 0  2. Vitamin D deficiency Low Vitamin D level contributes to fatigue and are associated with obesity, breast, and colon cancer. We will refill prescription Vitamin D 50,000 IU every week for 1 month with no refills and Jacqueline Good will follow-up for routine testing of Vitamin D, at least 2-3 times per year to avoid over-replacement.  - Vitamin D, Ergocalciferol, (DRISDOL) 1.25 MG (50000 UNIT) CAPS capsule; Take 1 capsule (50,000 Units total) by mouth every 7 (seven) days.  Dispense: 4 capsule; Refill: 0  3. Other disorder of eating Behavior modification techniques were discussed today to help Jacqueline Good deal with her  emotional/non-hunger eating behaviors.  We will refill Wellbutrin 150 mg for 1 month with no refills. Orders and follow up as documented in patient record.    - buPROPion (WELLBUTRIN SR) 150 MG 12 hr tablet; Take 1 tablet (150 mg total) by mouth daily.  Dispense: 30 tablet; Refill: 0  4. Obesity BMI today is 32 Jacqueline Good is currently in the action stage of change. As such, her goal is to continue with weight loss efforts. She has agreed to the Category 3 Plan.   Jacqueline Good will continue meal planning. We discussed strategies for the Holidays.   Exercise goals:  Jacqueline Good will begin walking and chair exercise.   Behavioral modification strategies: increasing lean protein intake, decreasing simple carbohydrates, increasing vegetables, increasing water intake, decreasing eating out, no skipping meals, meal planning and cooking strategies, keeping healthy foods in the home, avoiding temptations, and planning for success.  Jacqueline Good has agreed to follow-up with our clinic in 2-3 weeks. She was informed of the importance of frequent follow-up visits to maximize her success with intensive lifestyle modifications for her multiple health conditions.   Objective:   Blood pressure 119/78, pulse 72, temperature 98 F (36.7 C), height 5\' 4"  (1.626 m), weight 225 lb (102.1 kg), SpO2 96 %. Body mass index is 38.62 kg/m.  General: Cooperative, alert, well developed, in no acute distress. HEENT: Conjunctivae and lids unremarkable. Cardiovascular: Regular rhythm.  Lungs: Normal work of breathing. Neurologic: No focal deficits.   Lab Results  Component Value Date   CREATININE 0.96 08/15/2020   BUN 19  08/15/2020   NA 140 08/15/2020   K 3.8 08/15/2020   CL 106 08/15/2020   CO2 23 08/15/2020   Lab Results  Component Value Date   ALT 29 08/15/2020   AST 20 08/15/2020   ALKPHOS 83 08/15/2020   BILITOT 1.0 08/15/2020   Lab Results  Component Value Date   HGBA1C 5.5 03/21/2021   HGBA1C 5.4 04/27/2014    Lab Results  Component Value Date   INSULIN 31.4 (H) 03/21/2021   Lab Results  Component Value Date   TSH 2.200 03/21/2021   Lab Results  Component Value Date   CHOL 175 03/21/2021   HDL 44 03/21/2021   LDLCALC 93 03/21/2021   TRIG 227 (H) 03/21/2021   CHOLHDL 3.2 02/18/2019   Lab Results  Component Value Date   VD25OH 14.4 (L) 03/21/2021   Lab Results  Component Value Date   WBC 6.7 08/15/2020   HGB 13.0 08/15/2020   HCT 39.8 08/15/2020   MCV 89.0 08/15/2020   PLT 221 08/15/2020   No results found for: IRON, TIBC, FERRITIN  Attestation Statements:   Reviewed by clinician on day of visit: allergies, medications, problem list, medical history, surgical history, family history, social history, and previous encounter notes.  I, Lizbeth Bark, RMA, am acting as Location manager for [employer], DO.   I have reviewed the above documentation for accuracy and completeness, and I agree with the above. Jearld Lesch, DO

## 2021-06-20 ENCOUNTER — Encounter: Payer: Self-pay | Admitting: Oncology

## 2021-06-20 ENCOUNTER — Encounter: Payer: Self-pay | Admitting: Adult Health

## 2021-06-21 ENCOUNTER — Ambulatory Visit: Payer: Medicaid Other | Admitting: Adult Health

## 2021-06-21 ENCOUNTER — Other Ambulatory Visit: Payer: Medicaid Other

## 2021-06-24 ENCOUNTER — Encounter (INDEPENDENT_AMBULATORY_CARE_PROVIDER_SITE_OTHER): Payer: Self-pay | Admitting: Bariatrics

## 2021-06-25 ENCOUNTER — Ambulatory Visit: Payer: Self-pay | Admitting: Adult Health

## 2021-06-25 ENCOUNTER — Other Ambulatory Visit: Payer: Self-pay

## 2021-06-25 DIAGNOSIS — M47816 Spondylosis without myelopathy or radiculopathy, lumbar region: Secondary | ICD-10-CM | POA: Diagnosis not present

## 2021-06-25 NOTE — Progress Notes (Signed)
Virtual Visit via Video Note  I connected with Jacqueline Good on 06/27/21 at  3:00 PM EST by a video enabled telemedicine application and verified that I am speaking with the correct person using two identifiers.  Location: Patient: home Provider: office Persons participated in the visit- patient, provider    I discussed the limitations of evaluation and management by telemedicine and the availability of in person appointments. The patient expressed understanding and agreed to proceed.    I discussed the assessment and treatment plan with the patient. The patient was provided an opportunity to ask questions and all were answered. The patient agreed with the plan and demonstrated an understanding of the instructions.   The patient was advised to call back or seek an in-person evaluation if the symptoms worsen or if the condition fails to improve as anticipated.  I provided 15 minutes of non-face-to-face time during this encounter.   Jacqueline Clay, MD    [facility] MD/PA/NP OP Progress Note  06/27/2021 3:35 PM Jacqueline Good  MRN:  [id number]  Chief Complaint:  Chief Complaint   Depression; Anxiety; Follow-up    HPI:  This is a follow-up appointment for depression and anxiety, insomnia.  She states that she went on a cruise to the [place].  It was a birthday present for her and her sister.  She enjoyed the trip with her friends and her sister.  Although she continues to feel depressed at times, it occurs once a week, and she states that she has come to deal with that. She feels great that her medical condition is good according to the visit this morning with her provider.  Although she feels anxious, it has been less.  She is working on diet.  She hopes to have a positive benefit from this.  Her mother is doing good.  She sleeps well.  She has good energy.  Her concentration is unchanged.  She denies SI.  She denies panic attacks.  She denies alcohol use or drug use.  She feels comfortable  to stay on the medication as it is.   Wt Readings from Last 3 Encounters:  06/27/21 225 lb 1.6 oz (102.1 kg)  06/19/21 225 lb (102.1 kg)  05/21/21 225 lb (102.1 kg)     Daily routine: she denies having routine, doing housed hold chores, visiting her mother  Employment: used to work as Research scientist (physical sciences), last in 2017; although the patient was terminated, it was a good timing for the patient as she was taking care of her cousin, who later moved into nursing home.  Support: Household: by herself. Her mother lives next door Marital status: single  Number of children:1 daughter, who lives in [place]. She has estranged relationship with the father of her daughter.  She grew up in [place].  Her parents got divorced when she was 3-year-old.  Although she did not like them arguing with each other, she reports good relationship with her mother, who raised the patient and other siblings.    Visit Diagnosis:    ICD-10-CM   1. MDD (major depressive disorder), recurrent episode, mild (HCC)  F33.0 buPROPion (WELLBUTRIN SR) 150 MG 12 hr tablet    2. Insomnia, unspecified type  G47.00 zolpidem (AMBIEN) 10 MG tablet    3. Generalized anxiety disorder  F41.1       Past Psychiatric History: Please see initial evaluation for full details. I have reviewed the history. No updates at this time.     Past Medical History:  Past  Medical History:  Diagnosis Date   Anemia    Anxiety    Arthritis    "all over" (08/12/2018)   Asthma    Back pain    Breast cancer, right breast ([place]) dx'd 02/2018   Chronic bronchitis ([place])    Depression    Family history of breast cancer    Family history of prostate cancer    Family history of uterine cancer    GERD (gastroesophageal reflux disease)    History of blood transfusion    "several; related to low blood" (08/12/2018)   History of radiation therapy 11/15/18- 12/17/18   Right Chest wall, SCV, PAB. 25 fractions.    Lymphedema of right arm    Palpitations    Personal  history of chemotherapy    Personal history of radiation therapy     Past Surgical History:  Procedure Laterality Date   AXILLARY LYMPH NODE DISSECTION Right 08/12/2018   Procedure: AXILLARY LYMPH NODE DISSECTION;  Surgeon: Alphonsa Overall, MD;  Location: [place];  Service: General;  Laterality: Right;   BREAST BIOPSY Right 03/2018   BREAST RECONSTRUCTION WITH PLACEMENT OF TISSUE EXPANDER AND FLEX HD (ACELLULAR HYDRATED DERMIS) Right 08/12/2018   Procedure: RIGHT BREAST RECONSTRUCTION WITH PLACEMENT OF TISSUE EXPANDER AND FLEX HD (ACELLULAR HYDRATED DERMIS);  Surgeon: Wallace Going, DO;  Location: [place];  Service: Plastics;  Laterality: Right;   BREAST REDUCTION SURGERY Left 09/28/2019   Procedure: LEFT MAMMARY REDUCTION/MASTOPEXY  (BREAST);  Surgeon: Wallace Going, DO;  Location: [place];  Service: Plastics;  Laterality: Left;   DILATION AND CURETTAGE OF UTERUS     ENDOMETRIAL ABLATION     IR IMAGING GUIDED PORT INSERTION  03/18/2018   MASTECTOMY MODIFIED RADICAL Right 08/12/2018   Procedure: RIGHT MODIFIED RADICAL MASTECTOMY;  Surgeon: Alphonsa Overall, MD;  Location: [facility] [place];  Service: General;  Laterality: Right;   MASTECTOMY MODIFIED RADICAL Right 08/12/2018   w/axillary LND   MYOMECTOMY     REDUCTION MAMMAPLASTY Left 2021   REMOVAL OF TISSUE EXPANDER AND PLACEMENT OF IMPLANT Right 10/26/2018   Procedure: Removal of right breast expander;  Surgeon: Wallace Going, DO;  Location: [facility] [place];  Service: Plastics;  Laterality: Right;  75 min, please    Family Psychiatric History: Please see initial evaluation for full details. I have reviewed the history. No updates at this time.     Family History:  Family History  Problem Relation Age of Onset   Lupus Sister    Heart disease Sister    Breast cancer Mother 31   Prostate cancer Paternal Uncle    Diabetes Maternal Grandmother    Heart Problems Maternal Grandmother    Prostate cancer Maternal Grandfather    Prostate  cancer Paternal Grandfather    Prostate cancer Other        MGFs brother   Breast cancer Other        Mat great-grandmother's sister   Uterine cancer Maternal Great-grandmother    Prostate cancer Other        PGFs brother    Social History:  Social History   Socioeconomic History   Marital status: Single    Spouse name: Not on file   Number of children: Not on file   Years of education: Not on file   Highest education level: Not on file  Occupational History   Not on file  Tobacco Use   Smoking status: Former    Packs/day: 1.00    Years: 38.00  Pack years: 38.00    Types: Cigarettes    Quit date: 05/15/2018    Years since quitting: 3.1   Smokeless tobacco: Never  Vaping Use   Vaping Use: Never used  Substance and Sexual Activity   Alcohol use: Yes    Comment: occ   Drug use: No   Sexual activity: Not Currently    Birth control/protection: None  Other Topics Concern   Not on file  Social History Narrative   Not on file   Social Determinants of Health   Financial Resource Strain: Not on file  Food Insecurity: Not on file  Transportation Needs: Not on file  Physical Activity: Not on file  Stress: Not on file  Social Connections: Not on file    Allergies:  Allergies  Allergen Reactions   Gadolinium Derivatives Itching and Cough    Pt began sneezing and coughing as soon as MRI contrast was injected. Severe nasal congestion. No rash or hives no SOB.    Hydrocodone Hives and Itching    Pt says she can take it with benadryl.    Iodinated Diagnostic Agents Itching    Metabolic Disorder Labs: Lab Results  Component Value Date   HGBA1C 5.5 03/21/2021   No results found for: PROLACTIN Lab Results  Component Value Date   CHOL 175 03/21/2021   TRIG 227 (H) 03/21/2021   HDL 44 03/21/2021   CHOLHDL 3.2 02/18/2019   VLDL 23 02/18/2019   LDLCALC 93 03/21/2021   LDLCALC 115 (H) 02/18/2019   Lab Results  Component Value Date   TSH 2.200 03/21/2021   TSH  0.58 09/24/2016    Therapeutic Level Labs: No results found for: LITHIUM No results found for: VALPROATE No components found for:  CBMZ  Current Medications: Current Outpatient Medications  Medication Sig Dispense Refill   [START ON 07/11/2021] ALPRAZolam (XANAX) 1 MG tablet TAKE 1 TABLET BY MOUTH 3 TIMES DAILY AND 1/2 TABLET AS NEEDED FOR ANXIETY (MAX 3.5MG  DAILY) 105 tablet 2   [START ON 07/19/2021] buPROPion (WELLBUTRIN SR) 150 MG 12 hr tablet Take 1 tablet (150 mg total) by mouth daily. 30 tablet 2   esomeprazole (NEXIUM) 40 MG capsule TAKE 1 CAPSULE (40 MG TOTAL) BY MOUTH DAILY. 30 capsule 2   loratadine (CLARITIN) 10 MG tablet Take 1 tablet (10 mg total) by mouth daily as needed for allergies. 30 tablet 11   [START ON 08/29/2021] lurasidone (LATUDA) 40 MG TABS tablet TAKE 1 TABLET (40 MG TOTAL) BY MOUTH DAILY WITH BREAKFAST. 90 tablet 1   meloxicam (MOBIC) 15 MG tablet TAKE 1 TABLET BY MOUTH ONCE A DAY 30 tablet 3   metFORMIN (GLUCOPHAGE) 500 MG tablet Take 1 tablet by mouth with lunch daily 30 tablet 0   mirtazapine (REMERON) 30 MG tablet Take 1 tablet (30 mg total) by mouth at bedtime. 90 tablet 1   Multiple Vitamin (MULTIVITAMIN) capsule Take 1 capsule by mouth daily.     omeprazole (PRILOSEC) 40 MG capsule TAKE 1 CAPSULE BY MOUTH DAILY. 30 capsule 2   propranolol (INDERAL) 40 MG tablet Take 40 mg by mouth daily as needed (heart palpitation.).      venlafaxine XR (EFFEXOR-XR) 75 MG 24 hr capsule Take 3 capsules (225 mg total) by mouth daily with breakfast. 270 capsule 1   Vitamin D, Ergocalciferol, (DRISDOL) 1.25 MG (50000 UNIT) CAPS capsule Take 1 capsule (50,000 Units total) by mouth every 7 (seven) days. 4 capsule 0   [START ON 07/11/2021] zolpidem (AMBIEN) 10  MG tablet TAKE 1 TABLET BY MOUTH AT BEDTIME AS NEEDED FOR SLEEP 30 tablet 2   No current facility-administered medications for this visit.     Musculoskeletal: Strength & Muscle Tone:  N/A Gait & Station:  N/A Patient  leans: N/A  Psychiatric Specialty Exam: Review of Systems  Psychiatric/Behavioral:  Positive for decreased concentration and dysphoric mood. Negative for agitation, behavioral problems, confusion, hallucinations, self-injury, sleep disturbance and suicidal ideas. The patient is nervous/anxious. The patient is not hyperactive.   All other systems reviewed and are negative.  There were no vitals taken for this visit.There is no height or weight on file to calculate BMI.  General Appearance: Fairly Groomed  Eye Contact:  Good  Speech:  Clear and Coherent  Volume:  Normal  Mood:  Depressed  Affect:  Appropriate, Congruent, and less restricted  Thought Process:  Coherent  Orientation:  Full (Time, Place, and Person)  Thought Content: Logical   Suicidal Thoughts:  No  Homicidal Thoughts:  No  Memory:  Immediate;   Good  Judgement:  Good  Insight:  Good  Psychomotor Activity:  Normal  Concentration:  Concentration: Good and Attention Span: Good  Recall:  Good  Fund of Knowledge: Good  Language: Good  Akathisia:  No  Handed:  Right  AIMS (if indicated): not done  Assets:  Communication Skills Desire for Improvement  ADL's:  Intact  Cognition: WNL  Sleep:  Fair   Screenings: GAD-7    [place] Office Visit from 04/22/2018 in [facility] Office Visit from 06/03/2017 in [facility] Office Visit from 09/24/2016 in [facility] Office Visit from 03/27/2016 in [facility]  Total GAD-7 Score 11 3 15 16       PHQ2-9    Flowsheet Row Video Visit from 04/09/2021 in [facility] Office Visit from 03/21/2021 in [facility] Video Visit from 11/07/2020 in [facility] Office Visit from 04/22/2018 in [facility] Office Visit from 06/03/2017 in [facility]  PHQ-2 Total Score 6 1 4 5  0  PHQ-9 Total Score 11 4 11 15 4       Flowsheet Row Video Visit from 01/08/2021 in [facility] No Risk        Assessment and Plan:  NATASSJA OLLIS is a 60 y.o. year old female with a history of depression, OCD, estrogen negative T2 pN1, stage IIB invasive ductal carcinoma, diagnosed 02/25/2018 s/p mastectomy 08/2018, neoadjuvant chemo immunotherapy/on trastzumab, asthma, OA, chronic back pain, who presents for follow up appointment for below.    1. MDD (major depressive disorder), recurrent episode, mild ([place]) 3. Generalized anxiety disorder Exam is notable for calmer affect, and there has been more improvement in depressive symptoms and anxiety since the last visit.  Psychosocial stressors includes her medical condition, unemployment, chronic pain, and taking care of her mother with medical condition.  Will continue venlafaxine, mirtazapine, Latuda to target depression.  Will continue Xanax as needed for anxiety.  Noted that it has been discussed about referral for CBT, she is not interested in this option.   2. Insomnia, unspecified type She has good benefit from Ambien.  We will continue current dose to target insomnia.  Noted that although she has snoring, she is not interested in the evaluation of sleep apnea.   # inattention Unchanged.  She has a history of worsening in concentration over the past few years before chemotherapy.  No history of IEP/ADHD.  The etiology is likely multifactorial given her ongoing mood symptoms.  She is not interested in a neuropsychological evaluation.    This clinician has discussed the side effect associated with medication prescribed during this encounter. Please refer to notes in the previous encounters for more details.    Plan  1. Continue venlafaxine 225 mg daily 2. Continue Latuda 40 mg, take at night - monitor weight gain 3. Continue mirtazapine 15 mg at  night  4. Continue Xanax 1 mg three times a day (and additional 0.5 mg daily) as needed for anxiety    5. Continue Ambien 10 mg at night as needed for insomnia 6. Next appointment: 2/13 at 4 PM for 30 mins, video. She declined in person visit as she does not drive highway.    Past trials of medication: sertraline, fluoxetine, lexapro, Buspar, venlafaxine, duloxetine, bupropion, quetiapine, Abilify   The patient demonstrates the following risk factors for suicide: Chronic risk factors for suicide include: psychiatric disorder of depression and medical illness of breast cancer. Acute risk factors for suicide include: unemployment and loss (financial, interpersonal, professional). Protective factors for this patient include: positive social support, responsibility to others (children, family), coping skills and hope for the future. Considering these factors, the overall suicide risk at this point appears to be low. Patient is appropriate for outpatient follow up.      Jacqueline Clay, MD 06/27/2021, 3:35 PM

## 2021-06-26 ENCOUNTER — Other Ambulatory Visit (HOSPITAL_COMMUNITY): Payer: Self-pay

## 2021-06-26 ENCOUNTER — Other Ambulatory Visit: Payer: Self-pay | Admitting: *Deleted

## 2021-06-26 ENCOUNTER — Other Ambulatory Visit: Payer: Self-pay

## 2021-06-26 DIAGNOSIS — Z171 Estrogen receptor negative status [ER-]: Secondary | ICD-10-CM

## 2021-06-26 NOTE — Progress Notes (Signed)
[place]  [phone] [phone]    ID: Jacqueline Good DOB: [birth year]  MR#: [id number]  NIO#:[id number]  Patient Care Team: Johna Roles, PA as PCP - General (Internal Medicine) Dillingham, Loel Lofty, DO as Attending Physician (Plastic Surgery) Norman Clay, MD as Consulting Physician (Psychiatry) Savas, Allison Quarry, MD as Consulting Physician (Dermatology) OTHER MD: Donnal Moat, PA-C [FAX [phone]]   CHIEF COMPLAINT: 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 She continues under observation.  She Jacqueline Good is doing quite well today.  She is having no new health concerns today.  She notes that she continues to have a persistent right chest wall discomfort and muscle spasm.  This has been present since her surgery.  She Jacqueline Good has tried to walk from time to time.  When she went to the park she had a couple of bad experiences, so she has avoided doing that at the park.  She follows up with her primary care provider annually.  She underwent CT scan lung cancer screening in June 2022 that was normal.  She did note some occasional shortness of breath with activity.  She says this has been present since going through her chemotherapy.  She denies any orthopnea swelling chest pain or funny heartbeats.   REVIEW OF SYSTEMS: Review of Systems  Constitutional:  Positive for fatigue. Negative for appetite change, chills, fever and unexpected weight change.  HENT:   Negative for hearing loss, lump/mass and trouble swallowing.   Eyes:  Negative for eye problems and icterus.  Respiratory:  Positive for shortness of breath. Negative for chest tightness and cough.   Cardiovascular:  Negative for chest pain, leg swelling and palpitations.  Gastrointestinal:  Negative for abdominal distention,  abdominal pain, constipation, diarrhea, nausea and vomiting.  Endocrine: Negative for hot flashes.  Genitourinary:  Negative for difficulty urinating.   Musculoskeletal:  Negative for arthralgias.  Skin:  Negative for itching and rash.  Neurological:  Negative for dizziness, extremity weakness, headaches and numbness.  Hematological:  Negative for adenopathy. Does not bruise/bleed easily.  Psychiatric/Behavioral:  Negative for depression. The patient is nervous/anxious.      COVID 19 VACCINATION STATUS: Status post Pfizer x2 with booster November 2021    HISTORY OF CURRENT ILLNESS: From the original intake note:  Jacqueline Good had routine screening mammography on 02/05/2018 showing a possible abnormality in the right breast. She underwent unilateral right diagnostic mammography with tomography and right breast ultrasonography at [facility] on 02/15/2018 showing: Highly suspicious right breast mass at 10 o'clock position.  There was a second, 0.6 cm lesion at the 10:00 radiant which has not been biopsied.  Suspicious focal cortical thickening of a single right axillary lymph node.  Accordingly on 02/25/2018 she proceeded to biopsy of the right breast area in question. The pathology from this procedure showed ([id number]): Breast, right, needle core biopsy, OUQ with microscopic focus of invasive ductal carcinoma, grade 2, arising in a background of high grade ductal carcinoma in situ. 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   The patient's subsequent history is as detailed below.   PAST MEDICAL HISTORY: Past Medical History:  Diagnosis Date   Anemia    Anxiety    Arthritis    "all over" (08/12/2018)   Asthma    Back pain  Breast cancer, right breast ([place]) dx'd 02/2018   Chronic bronchitis ([place])    Depression    Family history of breast cancer    Family history of prostate cancer    Family history of uterine  cancer    GERD (gastroesophageal reflux disease)    History of blood transfusion    "several; related to low blood" (08/12/2018)   History of radiation therapy 11/15/18- 12/17/18   Right Chest wall, SCV, PAB. 25 fractions.    Lymphedema of right arm    Palpitations    Personal history of chemotherapy    Personal history of radiation therapy     PAST SURGICAL HISTORY: Past Surgical History:  Procedure Laterality Date   AXILLARY LYMPH NODE DISSECTION Right 08/12/2018   Procedure: AXILLARY LYMPH NODE DISSECTION;  Surgeon: Alphonsa Overall, MD;  Location: [facility];  Service: General;  Laterality: Right;   BREAST BIOPSY Right 03/2018   BREAST RECONSTRUCTION WITH PLACEMENT OF TISSUE EXPANDER AND FLEX HD (ACELLULAR HYDRATED DERMIS) Right 08/12/2018   Procedure: RIGHT BREAST RECONSTRUCTION WITH PLACEMENT OF TISSUE EXPANDER AND FLEX HD (ACELLULAR HYDRATED DERMIS);  Surgeon: Wallace Going, DO;  Location: [place];  Service: Plastics;  Laterality: Right;   BREAST REDUCTION SURGERY Left 09/28/2019   Procedure: LEFT MAMMARY REDUCTION/MASTOPEXY  (BREAST);  Surgeon: Wallace Going, DO;  Location: [facility];  Service: Plastics;  Laterality: Left;   DILATION AND CURETTAGE OF UTERUS     ENDOMETRIAL ABLATION     IR IMAGING GUIDED PORT INSERTION  03/18/2018   MASTECTOMY MODIFIED RADICAL Right 08/12/2018   Procedure: RIGHT MODIFIED RADICAL MASTECTOMY;  Surgeon: Alphonsa Overall, MD;  Location: [facility];  Service: General;  Laterality: Right;   MASTECTOMY MODIFIED RADICAL Right 08/12/2018   w/axillary LND   MYOMECTOMY     REDUCTION MAMMAPLASTY Left 2021   REMOVAL OF TISSUE EXPANDER AND PLACEMENT OF IMPLANT Right 10/26/2018   Procedure: Removal of right breast expander;  Surgeon: Wallace Going, DO;  Location: [facility];  Service: Plastics;  Laterality: Right;  75 min, please    FAMILY HISTORY: Family History  Problem Relation Age of Onset   Lupus Sister    Heart disease Sister    Breast cancer  Mother 42   Prostate cancer Paternal Uncle    Diabetes Maternal Grandmother    Heart Problems Maternal Grandmother    Prostate cancer Maternal Grandfather    Prostate cancer Paternal Grandfather    Prostate cancer Other        MGFs brother   Breast cancer Other        Mat great-grandmother's sister   Uterine cancer Maternal Great-grandmother    Prostate cancer Other        PGFs brother   She notes (August 2019) that her father is currently 43 years old. Patients' mother is currently 70 years old and she was diagnosed with breast cancer at age 53. The patient has 4 brothers and 1 sister. She has a maternal aunt with breast cancer and her maternal great grandmother with had ovarian cancer.    GYNECOLOGIC HISTORY:  No LMP recorded. Patient is postmenopausal. Menarche: 60 years old Age at first live birth: 60 years old GX P: 1 LMP: 13-14 years ago s/p ablation Contraceptive: no HRT: no  Hysterectomy: no SO: no   SOCIAL HISTORY: She is currently unemployed.  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 She lives alone without pets. Her daughter is [name] who lives in [place] and  works in Therapist, art.  The patient has one grandchild. She goes to a [employer].   ADVANCED DIRECTIVES: Not in place.  At the 03/03/2018 visit the patient was given the appropriate documents to complete and notarized at her discretion   HEALTH MAINTENANCE: Social History   Tobacco Use   Smoking status: Former    Packs/day: 1.00    Years: 38.00    Pack years: 38.00    Types: Cigarettes    Quit date: 05/15/2018    Years since quitting: 3.1   Smokeless tobacco: Never  Vaping Use   Vaping Use: Never used  Substance Use Topics   Alcohol use: Yes    Comment: occ   Drug use: No     Colonoscopy: Never  PAP:   Bone density: Never   Allergies  Allergen Reactions   Gadolinium Derivatives Itching and Cough    Pt began sneezing and coughing as soon as MRI  contrast was injected. Severe nasal congestion. No rash or hives no SOB.    Hydrocodone Hives and Itching    Pt says she can take it with benadryl.    Iodinated Diagnostic Agents Itching    Current Outpatient Medications  Medication Sig Dispense Refill   ALPRAZolam (XANAX) 1 MG tablet TAKE 1 TABLET BY MOUTH 3 TIMES DAILY AND 1/2 TABLET AS NEEDED FOR ANXIETY (MAX 3.5MG DAILY) 105 tablet 2   buPROPion (WELLBUTRIN SR) 150 MG 12 hr tablet Take 1 tablet (150 mg total) by mouth daily. 30 tablet 0   esomeprazole (NEXIUM) 40 MG capsule TAKE 1 CAPSULE (40 MG TOTAL) BY MOUTH DAILY. 30 capsule 2   loratadine (CLARITIN) 10 MG tablet Take 1 tablet (10 mg total) by mouth daily as needed for allergies. 30 tablet 11   lurasidone (LATUDA) 40 MG TABS tablet TAKE 1 TABLET (40 MG TOTAL) BY MOUTH DAILY WITH BREAKFAST. 90 tablet 1   meloxicam (MOBIC) 15 MG tablet TAKE 1 TABLET BY MOUTH ONCE A DAY 30 tablet 3   metFORMIN (GLUCOPHAGE) 500 MG tablet Take 1 tablet by mouth with lunch daily 30 tablet 0   mirtazapine (REMERON) 30 MG tablet Take 1 tablet (30 mg total) by mouth at bedtime. 90 tablet 1   Multiple Vitamin (MULTIVITAMIN) capsule Take 1 capsule by mouth daily.     omeprazole (PRILOSEC) 40 MG capsule TAKE 1 CAPSULE BY MOUTH DAILY. 30 capsule 2   propranolol (INDERAL) 40 MG tablet Take 40 mg by mouth daily as needed (heart palpitation.).      venlafaxine XR (EFFEXOR-XR) 75 MG 24 hr capsule Take 3 capsules (225 mg total) by mouth daily with breakfast. 270 capsule 1   Vitamin D, Ergocalciferol, (DRISDOL) 1.25 MG (50000 UNIT) CAPS capsule Take 1 capsule (50,000 Units total) by mouth every 7 (seven) days. 4 capsule 0   zolpidem (AMBIEN) 10 MG tablet TAKE 1 TABLET BY MOUTH AT BEDTIME AS NEEDED FOR SLEEP 30 tablet 2   No current facility-administered medications for this visit.    OBJECTIVE: African-American woman in no acute distress  Vitals:   06/27/21 1140  BP: 130/68  Pulse: 79  Resp: 16  Temp: 98.1 F  (36.7 C)  SpO2: 95%      Body mass index is 38.64 kg/m.   Wt Readings from Last 3 Encounters:  06/27/21 225 lb 1.6 oz (102.1 kg)  06/19/21 225 lb (102.1 kg)  05/21/21 225 lb (102.1 kg)   GENERAL: Patient is a well appearing female in no acute distress HEENT:  Sclerae anicteric.  Oropharynx clear and moist. No ulcerations or evidence of oropharyngeal candidiasis. Neck is supple.  NODES:  No cervical, supraclavicular, or axillary lymphadenopathy palpated.  BREAST EXAM: Right breast is status postmastectomy no sign of local recurrence left breast is benign LUNGS:  Clear to auscultation bilaterally.  No wheezes or rhonchi. HEART:  Regular rate and rhythm. No murmur appreciated. ABDOMEN:  Soft, nontender.  Positive, normoactive bowel sounds. No organomegaly palpated. MSK:  No focal spinal tenderness to palpation. Full range of motion bilaterally in the upper extremities. EXTREMITIES:  No peripheral edema.   SKIN:  Clear with no obvious rashes or skin changes. No nail dyscrasia. NEURO:  Nonfocal. Well oriented.  Appropriate affect.   LAB RESULTS:  CMP     Component Value Date/Time   NA 143 06/27/2021 1121   K 4.0 06/27/2021 1121   CL 109 06/27/2021 1121   CO2 23 06/27/2021 1121   GLUCOSE 99 06/27/2021 1121   BUN 23 06/27/2021 1121   CREATININE 1.03 (H) 06/27/2021 1121   CREATININE 0.83 09/24/2016 1151   CALCIUM 9.7 06/27/2021 1121   PROT 7.8 06/27/2021 1121   ALBUMIN 4.2 06/27/2021 1121   AST 15 06/27/2021 1121   ALT 15 06/27/2021 1121   ALKPHOS 91 06/27/2021 1121   BILITOT 0.6 06/27/2021 1121   GFRNONAA >60 06/27/2021 1121   GFRNONAA 79 09/24/2016 1151   GFRAA >60 02/09/2020 1409   GFRAA >89 09/24/2016 1151    No results found for: TOTALPROTELP, ALBUMINELP, A1GS, A2GS, BETS, BETA2SER, GAMS, MSPIKE, SPEI  No results found for: KPAFRELGTCHN, LAMBDASER, KAPLAMBRATIO  Lab Results  Component Value Date   WBC 7.2 06/27/2021   NEUTROABS 5.5 06/27/2021   HGB 13.0  06/27/2021   HCT 40.2 06/27/2021   MCV 87.2 06/27/2021   PLT 244 06/27/2021   No results found for: LABCA2  No components found for: PVXYIA165  No results for input(s): INR in the last 168 hours.  No results found for: LABCA2  No results found for: VVZ482  No results found for: LMB867  No results found for: JQG920  No results found for: CA2729  No components found for: HGQUANT  No results found for: CEA1 / No results found for: CEA1   No results found for: AFPTUMOR  No results found for: CHROMOGRNA  No results found for: HGBA, HGBA2QUANT, HGBFQUANT, HGBSQUAN (Hemoglobinopathy evaluation)   No results found for: LDH  No results found for: IRON, TIBC, IRONPCTSAT (Iron and TIBC)  No results found for: FERRITIN  Urinalysis    Component Value Date/Time   COLORURINE CANCELED 09/24/2016 1151   APPEARANCEUR CANCELED 09/24/2016 1151   LABSPEC CANCELED 09/24/2016 1151   PHURINE CANCELED 09/24/2016 1151   GLUCOSEU CANCELED 09/24/2016 1151   HGBUR CANCELED 09/24/2016 1151   BILIRUBINUR CANCELED 09/24/2016 1151   KETONESUR CANCELED 09/24/2016 1151   PROTEINUR CANCELED 09/24/2016 1151   NITRITE CANCELED 09/24/2016 1151   LEUKOCYTESUR CANCELED 09/24/2016 1151     STUDIES:  No results found.    ELIGIBLE FOR AVAILABLE RESEARCH PROTOCOL:BCEP   ASSESSMENT: 60 y.o. [place] woman status post right breast upper outer quadrant biopsy 02/25/2018 for a clinical T2 pN1, stage IIB invasive ductal carcinoma, estrogen and progesterone receptor negative, HER-2 amplified, with an MIB-1 of 50%.  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 STK11, TP53, TSC1, TSC2, and VHL.  The following genes were evaluated for sequence changes only: SDHA and HOXB13 c.251G>A variant only.   (2) neoadjuvant chemotherapy consisting of carboplatin, docetaxel, trastuzumab and pertuzumab every 21 days x 6 starting 03/23/2018, completed 07/12/2018  (a) pertuzumab omitted after cycle 1 due to poorly-controlled diarrhea  (b) docetaxel discontinued after cycle 3 due to neuropathy; gemcitabine substituted  (c) Granix substituted for Neulasta on days 3-6 following chemotherapy due to improved tolerance  (d) Switched back to Neulasta after cycles 5 and 6 per patient request  (3) anti-HER-2 treatment continued for 12 months, last dose 03/31/2019  (a) baseline echocardiogram 03/16/2018 shows an ejection fraction in the 55-60% range.  (b) echo on 11/15 shows EF of 55-60%  (c) echo 02/18/2019 shows EF 55-60%  (4) status post right modified radical mastectomy 08/12/2018 showing a complete pathologic response (ypT0 ypN0)  (a) a total of 11 regional lymph nodes removed  (b) expander in place  (5) adjuvant radiation 11/15/2018-12/17/2018: Right chest wall 50 Gy in 25 fractions; Right CW, SCV,PAB: 50 Gy in 25 fractions  (6) left adrenal adenoma measuring 2.9 cm on CT of the chest 03/16/2018   PLAN:  Shamonique is doing quite well today.  She has no clinical or radiographic signs of breast cancer recurrence.  She is due for her left breast screening mammogram in December which is scheduled.  She will continue to undergo annual CT lung cancer screening as guided by her primary care provider.  I recommended that she continue to follow-up with them for her health maintenance and other cancer screening recommendations.  We discussed healthy diet and exercise to keep her risk  as well as possible.  She does not feel comfortable exercising outdoors or in public.  We discussed connecting to YouTube and doing some of the free exercise videos within the 4 walls of her home.  She thinks this is a good idea and she will try this.  Karlei met with Dr. Jana Hakim briefly to discuss his retirement.  I will see her back in 6 months for labs and follow-up.  She knows to call for any questions or concerns between now and her next appointment.  Total encounter time 30 minutes.*In face-to-face visit time, chart review, lab review, care coordination, order entry, and documentation of the encounter.  Wilber Bihari, NP 06/27/21 12:39 PM Medical Oncology and Hematology [facility] [place], [place] [address] Tel. [phone]    Fax. [phone]   *Total Encounter Time as defined by the Centers for Medicare and Medicaid Services includes, in addition to the face-to-face time of a patient visit (documented in the note above) non-face-to-face time: obtaining and reviewing outside history, ordering and reviewing medications, tests or procedures, care coordination (communications with other health care professionals or caregivers) and documentation in the medical record.

## 2021-06-27 ENCOUNTER — Other Ambulatory Visit: Payer: Self-pay

## 2021-06-27 ENCOUNTER — Encounter: Payer: Self-pay | Admitting: Adult Health

## 2021-06-27 ENCOUNTER — Other Ambulatory Visit (HOSPITAL_COMMUNITY): Payer: Self-pay

## 2021-06-27 ENCOUNTER — Telehealth (INDEPENDENT_AMBULATORY_CARE_PROVIDER_SITE_OTHER): Payer: Medicare Other | Admitting: Psychiatry

## 2021-06-27 ENCOUNTER — Inpatient Hospital Stay: Payer: Medicare Other | Attending: Adult Health

## 2021-06-27 ENCOUNTER — Encounter: Payer: Self-pay | Admitting: Oncology

## 2021-06-27 ENCOUNTER — Inpatient Hospital Stay (HOSPITAL_BASED_OUTPATIENT_CLINIC_OR_DEPARTMENT_OTHER): Payer: Medicare Other | Admitting: Adult Health

## 2021-06-27 ENCOUNTER — Encounter: Payer: Self-pay | Admitting: Psychiatry

## 2021-06-27 VITALS — BP 130/68 | HR 79 | Temp 98.1°F | Resp 16 | Ht 64.0 in | Wt 225.1 lb

## 2021-06-27 DIAGNOSIS — Z853 Personal history of malignant neoplasm of breast: Secondary | ICD-10-CM | POA: Insufficient documentation

## 2021-06-27 DIAGNOSIS — G4700 Insomnia, unspecified: Secondary | ICD-10-CM | POA: Diagnosis not present

## 2021-06-27 DIAGNOSIS — Z9221 Personal history of antineoplastic chemotherapy: Secondary | ICD-10-CM | POA: Insufficient documentation

## 2021-06-27 DIAGNOSIS — F330 Major depressive disorder, recurrent, mild: Secondary | ICD-10-CM

## 2021-06-27 DIAGNOSIS — F411 Generalized anxiety disorder: Secondary | ICD-10-CM | POA: Diagnosis not present

## 2021-06-27 DIAGNOSIS — Z923 Personal history of irradiation: Secondary | ICD-10-CM | POA: Insufficient documentation

## 2021-06-27 DIAGNOSIS — C50411 Malignant neoplasm of upper-outer quadrant of right female breast: Secondary | ICD-10-CM

## 2021-06-27 DIAGNOSIS — Z87891 Personal history of nicotine dependence: Secondary | ICD-10-CM | POA: Diagnosis not present

## 2021-06-27 DIAGNOSIS — Z171 Estrogen receptor negative status [ER-]: Secondary | ICD-10-CM | POA: Diagnosis not present

## 2021-06-27 LAB — CMP (CANCER CENTER ONLY)
ALT: 15 U/L (ref 0–44)
AST: 15 U/L (ref 15–41)
Albumin: 4.2 g/dL (ref 3.5–5.0)
Alkaline Phosphatase: 91 U/L (ref 38–126)
Anion gap: 11 (ref 5–15)
BUN: 23 mg/dL (ref 8–23)
CO2: 23 mmol/L (ref 22–32)
Calcium: 9.7 mg/dL (ref 8.9–10.3)
Chloride: 109 mmol/L (ref 98–111)
Creatinine: 1.03 mg/dL — ABNORMAL HIGH (ref 0.44–1.00)
GFR, Estimated: >60 mL/min (ref >60)
Glucose, Bld: 99 mg/dL (ref 70–99)
Potassium: 4 mmol/L (ref 3.5–5.1)
Sodium: 143 mmol/L (ref 135–145)
Total Bilirubin: 0.6 mg/dL (ref 0.3–1.2)
Total Protein: 7.8 g/dL (ref 6.5–8.1)

## 2021-06-27 LAB — CBC WITH DIFFERENTIAL (CANCER CENTER ONLY)
Abs Immature Granulocytes: 0.02 10*3/uL (ref 0.00–0.07)
Basophils Absolute: 0 10*3/uL (ref 0.0–0.1)
Basophils Relative: 0 %
Eosinophils Absolute: 0.1 10*3/uL (ref 0.0–0.5)
Eosinophils Relative: 1 %
HCT: 40.2 % (ref 36.0–46.0)
Hemoglobin: 13 g/dL (ref 12.0–15.0)
Immature Granulocytes: 0 %
Lymphocytes Relative: 16 %
Lymphs Abs: 1.2 10*3/uL (ref 0.7–4.0)
MCH: 28.2 pg (ref 26.0–34.0)
MCHC: 32.3 g/dL (ref 30.0–36.0)
MCV: 87.2 fL (ref 80.0–100.0)
Monocytes Absolute: 0.4 10*3/uL (ref 0.1–1.0)
Monocytes Relative: 5 %
Neutro Abs: 5.5 10*3/uL (ref 1.7–7.7)
Neutrophils Relative %: 78 %
Platelet Count: 244 10*3/uL (ref 150–400)
RBC: 4.61 MIL/uL (ref 3.87–5.11)
RDW: 14.2 % (ref 11.5–15.5)
WBC Count: 7.2 10*3/uL (ref 4.0–10.5)
nRBC: 0 % (ref 0.0–0.2)

## 2021-06-27 MED ORDER — LURASIDONE HCL 40 MG PO TABS
40.0000 mg | ORAL_TABLET | Freq: Every day | ORAL | 1 refills | Status: DC
  Filled 2021-06-27: qty 90, fill #0
  Filled 2021-07-13 – 2021-08-12 (×2): qty 90, 90d supply, fill #0
  Filled 2021-08-12: qty 30, 30d supply, fill #0

## 2021-06-27 MED ORDER — ALPRAZOLAM 1 MG PO TABS
ORAL_TABLET | ORAL | 2 refills | Status: DC
  Filled 2021-06-27: qty 105, fill #0
  Filled 2021-07-13: qty 105, 30d supply, fill #0
  Filled 2021-08-12: qty 105, 30d supply, fill #1
  Filled 2021-09-14: qty 105, 30d supply, fill #2

## 2021-06-27 MED ORDER — ZOLPIDEM TARTRATE 10 MG PO TABS
10.0000 mg | ORAL_TABLET | Freq: Every evening | ORAL | 2 refills | Status: DC | PRN
  Filled 2021-06-27: qty 30, fill #0
  Filled 2021-07-13: qty 30, 30d supply, fill #0
  Filled 2021-08-12: qty 30, 30d supply, fill #1
  Filled 2021-09-14: qty 30, 30d supply, fill #2

## 2021-06-27 MED ORDER — BUPROPION HCL ER (SR) 150 MG PO TB12
150.0000 mg | ORAL_TABLET | Freq: Every day | ORAL | 2 refills | Status: DC
  Filled 2021-06-27: qty 30, 30d supply, fill #0

## 2021-06-27 NOTE — Patient Instructions (Signed)
1. Continue venlafaxine 225 mg daily 2. Continue Latuda 40 mg, take at night  3. Continue mirtazapine 15 mg at night  4. Continue Xanax 1 mg three times a day (and additional 0.5 mg daily) as needed for anxiety    5. Continue Ambien 10 mg at night as needed for insomnia 6. Next appointment: 2/13 at 4 PM

## 2021-07-02 ENCOUNTER — Ambulatory Visit (INDEPENDENT_AMBULATORY_CARE_PROVIDER_SITE_OTHER): Payer: Medicare Other | Admitting: Bariatrics

## 2021-07-02 ENCOUNTER — Encounter (INDEPENDENT_AMBULATORY_CARE_PROVIDER_SITE_OTHER): Payer: Self-pay | Admitting: Bariatrics

## 2021-07-02 ENCOUNTER — Other Ambulatory Visit (HOSPITAL_COMMUNITY): Payer: Self-pay

## 2021-07-02 ENCOUNTER — Other Ambulatory Visit: Payer: Self-pay

## 2021-07-02 VITALS — BP 129/83 | HR 78 | Temp 97.7°F | Ht 64.0 in | Wt 222.0 lb

## 2021-07-02 DIAGNOSIS — Z6841 Body Mass Index (BMI) 40.0 and over, adult: Secondary | ICD-10-CM | POA: Diagnosis not present

## 2021-07-02 DIAGNOSIS — E8881 Metabolic syndrome: Secondary | ICD-10-CM | POA: Diagnosis not present

## 2021-07-02 DIAGNOSIS — F3289 Other specified depressive episodes: Secondary | ICD-10-CM | POA: Diagnosis not present

## 2021-07-02 MED ORDER — METFORMIN HCL 500 MG PO TABS
500.0000 mg | ORAL_TABLET | Freq: Every day | ORAL | 0 refills | Status: DC
  Filled 2021-07-02: qty 30, fill #0
  Filled 2021-07-13: qty 30, 30d supply, fill #0

## 2021-07-02 MED ORDER — PROAIR RESPICLICK 108 (90 BASE) MCG/ACT IN AEPB
INHALATION_SPRAY | RESPIRATORY_TRACT | 0 refills | Status: DC
  Filled 2021-07-02: qty 1, 30d supply, fill #0
  Filled 2021-07-12: qty 1, 28d supply, fill #0

## 2021-07-02 MED ORDER — BUPROPION HCL ER (SR) 150 MG PO TB12
150.0000 mg | ORAL_TABLET | Freq: Every day | ORAL | 2 refills | Status: DC
  Filled 2021-07-02: qty 30, 30d supply, fill #0

## 2021-07-02 MED ORDER — TOPIRAMATE 50 MG PO TABS
50.0000 mg | ORAL_TABLET | Freq: Every day | ORAL | 0 refills | Status: DC
  Filled 2021-07-02: qty 30, 30d supply, fill #0

## 2021-07-02 NOTE — Progress Notes (Signed)
Chief Complaint:   OBESITY Jacqueline Good is here to discuss her progress with her obesity treatment plan along with follow-up of her obesity related diagnoses. Jacqueline Good is on the Category 3 Plan and states she is following her eating plan approximately 75% of the time. Jacqueline Good states she is doing chair exercise and stretching for 6 minutes 3-4 times per week.  Today's visit was #: 8 Starting weight: 238 lbs Starting date: 03/21/2021 Today's weight: 222 lbs Today's date: 07/02/2021 Total lbs lost to date: 16 lbs Total lbs lost since last in-office visit: 3 lbs  Interim History: Jacqueline Good is down another 3 lbs and doing well overall.  Subjective:   1. Insulin resistance Jacqueline Good has a diagnosis of insulin resistance based on her elevated fasting insulin level >5. She states she has limited polyphagia. She continues to work on diet and exercise to decrease her risk of diabetes.  2. Other depression Jacqueline Good notes stress eating.  Assessment/Plan:   1. Insulin resistance Jacqueline Good will continue to work on weight loss, exercise, and decreasing simple carbohydrates to help decrease the risk of diabetes.We will refill Metformin 500 mg for 1 month with no refills.  Jacqueline Good agreed to follow-up with [place] as directed to closely monitor her progress.  - metFORMIN (GLUCOPHAGE) 500 MG tablet; Take 1 tablet by mouth with lunch daily  Dispense: 30 tablet; Refill: 0  2. Other depression Behavior modification techniques were discussed today to help Jacqueline Good deal with her emotional/non-hunger eating behaviors.  We will refill Wellbutrin SR 50 mg for 1 month with 2 refills. We will refill Topamax 50 mg for 1 month with no refills. Orders and follow up as documented in patient record.    - buPROPion (WELLBUTRIN SR) 150 MG 12 hr tablet; Take 1 tablet (150 mg total) by mouth daily.  Dispense: 30 tablet; Refill: 2 - topiramate (TOPAMAX) 50 MG tablet; Take 1 tablet (50 mg total) by mouth daily with supper.  Dispense:  30 tablet; Refill: 0  3. Obesity BMI today is 38.1 Jacqueline Good is currently in the action stage of change. As such, her goal is to continue with weight loss efforts. She has agreed to the Category 3 Plan.   Jacqueline Good will continue meal planning and she will continue intentional eating.  Exercise goals:  Jacqueline Good will add in stretching along with chair exercises.  Behavioral modification strategies: increasing lean protein intake, decreasing simple carbohydrates, increasing vegetables, increasing water intake, decreasing eating out, no skipping meals, meal planning and cooking strategies, keeping healthy foods in the home, and planning for success.  Jacqueline Good has agreed to follow-up with our clinic in 4 weeks. She was informed of the importance of frequent follow-up visits to maximize her success with intensive lifestyle modifications for her multiple health conditions.   Objective:   Blood pressure 129/83, pulse 78, temperature 97.7 F (36.5 C), height 5\' 4"  (1.626 m), weight 222 lb (100.7 kg), SpO2 96 %. Body mass index is 38.11 kg/m.  General: Cooperative, alert, well developed, in no acute distress. HEENT: Conjunctivae and lids unremarkable. Cardiovascular: Regular rhythm.  Lungs: Normal work of breathing. Neurologic: No focal deficits.   Lab Results  Component Value Date   CREATININE 1.03 (H) 06/27/2021   BUN 23 06/27/2021   NA 143 06/27/2021   K 4.0 06/27/2021   CL 109 06/27/2021   CO2 23 06/27/2021   Lab Results  Component Value Date   ALT 15 06/27/2021   AST 15 06/27/2021   ALKPHOS 91 06/27/2021  BILITOT 0.6 06/27/2021   Lab Results  Component Value Date   HGBA1C 5.5 03/21/2021   HGBA1C 5.4 04/27/2014   Lab Results  Component Value Date   INSULIN 31.4 (H) 03/21/2021   Lab Results  Component Value Date   TSH 2.200 03/21/2021   Lab Results  Component Value Date   CHOL 175 03/21/2021   HDL 44 03/21/2021   LDLCALC 93 03/21/2021   TRIG 227 (H) 03/21/2021    CHOLHDL 3.2 02/18/2019   Lab Results  Component Value Date   VD25OH 14.4 (L) 03/21/2021   Lab Results  Component Value Date   WBC 7.2 06/27/2021   HGB 13.0 06/27/2021   HCT 40.2 06/27/2021   MCV 87.2 06/27/2021   PLT 244 06/27/2021   No results found for: IRON, TIBC, FERRITIN  Attestation Statements:   Reviewed by clinician on day of visit: allergies, medications, problem list, medical history, surgical history, family history, social history, and previous encounter notes.  I, Lizbeth Bark, RMA, am acting as Location manager for [employer], DO.   I have reviewed the above documentation for accuracy and completeness, and I agree with the above. Jearld Lesch, DO

## 2021-07-10 ENCOUNTER — Other Ambulatory Visit (HOSPITAL_COMMUNITY): Payer: Self-pay

## 2021-07-11 ENCOUNTER — Ambulatory Visit
Admission: RE | Admit: 2021-07-11 | Discharge: 2021-07-11 | Disposition: A | Discharge status: Home / Self Care | Payer: Medicare Other | Source: Ambulatory Visit | Attending: Oncology | Admitting: Oncology

## 2021-07-11 DIAGNOSIS — Z1231 Encounter for screening mammogram for malignant neoplasm of breast: Secondary | ICD-10-CM

## 2021-07-12 ENCOUNTER — Other Ambulatory Visit (HOSPITAL_COMMUNITY): Payer: Self-pay

## 2021-07-13 ENCOUNTER — Other Ambulatory Visit (HOSPITAL_COMMUNITY): Payer: Self-pay

## 2021-07-30 ENCOUNTER — Other Ambulatory Visit: Payer: Self-pay

## 2021-07-30 ENCOUNTER — Other Ambulatory Visit (HOSPITAL_COMMUNITY): Payer: Self-pay

## 2021-07-30 ENCOUNTER — Encounter: Payer: Self-pay | Admitting: Oncology

## 2021-07-30 ENCOUNTER — Encounter (INDEPENDENT_AMBULATORY_CARE_PROVIDER_SITE_OTHER): Payer: Self-pay | Admitting: Bariatrics

## 2021-07-30 ENCOUNTER — Ambulatory Visit (INDEPENDENT_AMBULATORY_CARE_PROVIDER_SITE_OTHER): Payer: Medicare Other | Admitting: Bariatrics

## 2021-07-30 ENCOUNTER — Encounter: Payer: Self-pay | Admitting: Adult Health

## 2021-07-30 VITALS — BP 100/72 | HR 86 | Temp 98.7°F | Ht 64.0 in | Wt 220.0 lb

## 2021-07-30 DIAGNOSIS — E8881 Metabolic syndrome: Secondary | ICD-10-CM

## 2021-07-30 DIAGNOSIS — F3289 Other specified depressive episodes: Secondary | ICD-10-CM

## 2021-07-30 DIAGNOSIS — E559 Vitamin D deficiency, unspecified: Secondary | ICD-10-CM | POA: Diagnosis not present

## 2021-07-30 DIAGNOSIS — Z6841 Body Mass Index (BMI) 40.0 and over, adult: Secondary | ICD-10-CM

## 2021-07-30 MED ORDER — METFORMIN HCL 500 MG PO TABS
500.0000 mg | ORAL_TABLET | Freq: Every day | ORAL | 0 refills | Status: DC
  Filled 2021-07-30: qty 30, 30d supply, fill #0

## 2021-07-30 MED ORDER — BUPROPION HCL ER (SR) 150 MG PO TB12
150.0000 mg | ORAL_TABLET | Freq: Every day | ORAL | 0 refills | Status: DC
  Filled 2021-07-30: qty 30, 30d supply, fill #0

## 2021-07-30 MED ORDER — VITAMIN D (ERGOCALCIFEROL) 1.25 MG (50000 UNIT) PO CAPS
50000.0000 [IU] | ORAL_CAPSULE | ORAL | 0 refills | Status: DC
  Filled 2021-07-30: qty 4, 28d supply, fill #0

## 2021-07-30 MED ORDER — TOPIRAMATE 50 MG PO TABS
50.0000 mg | ORAL_TABLET | Freq: Every day | ORAL | 0 refills | Status: DC
  Filled 2021-07-30: qty 30, 30d supply, fill #0

## 2021-07-31 NOTE — Progress Notes (Signed)
Chief Complaint:   OBESITY Jacqueline Good is here to discuss her progress with her obesity treatment plan along with follow-up of her obesity related diagnoses. Jacqueline Good is on the Category 3 Plan and states she is following her eating plan approximately 60% of the time. Jacqueline Good states she is doing chair exercise for 5 minutes 4 times per week.  Today's visit was #: 9 Starting weight: 238 lbs Starting date: 03/21/2021 Today's weight: 220 lbs Today's date: 07/30/2021 Total lbs lost to date: 18 lbs Total lbs lost since last in-office visit: 2 lbs  Interim History: Jacqueline Good is down 2 lbs since her last visit and has done well overall. She is getting adequate water.   Subjective:   1. Insulin resistance Jacqueline Good has a diagnosis of insulin resistance based on her elevated fasting insulin level >5. She continues to work on diet and exercise to decrease her risk of diabetes.  2. Vitamin D deficiency Jacqueline Good is currently taking Vitamin D.  3. Other depression Jacqueline Good is struggling with emotional eating and using food for comfort to the extent that it is negatively impacting her health. She has been working on behavior modification techniques to help reduce her emotional eating. She shows no sign of suicidal or homicidal ideations.   Assessment/Plan:   1. Insulin resistance We will refill Metformin 500 mg for 1 month with no refills. Jacqueline Good will continue to work on weight loss, exercise, and decreasing simple carbohydrates to help decrease the risk of diabetes. Jacqueline Good agreed to follow-up with [place] as directed to closely monitor her progress.  - metFORMIN (GLUCOPHAGE) 500 MG tablet; Take 1 tablet (500 mg total) by mouth daily with lunch.  Dispense: 30 tablet; Refill: 0  2. Vitamin D deficiency Low Vitamin D level contributes to fatigue and are associated with obesity, breast, and colon cancer. We will refill prescription Vitamin D 50,000 IU every week for 1 month with no refills and Jacqueline Good will  follow-up for routine testing of Vitamin D, at least 2-3 times per year to avoid over-replacement.  - Vitamin D, Ergocalciferol, (DRISDOL) 1.25 MG (50000 UNIT) CAPS capsule; Take 1 capsule (50,000 Units total) by mouth every 7 (seven) days.  Dispense: 4 capsule; Refill: 0  3. Other depression We will refill Topamax 50 mg for 1 month with no refills. We will refill Wellbutrin 150 mg for 1 month with no refills. Behavior modification techniques were discussed today to help Jacqueline Good deal with her emotional/non-hunger eating behaviors.  Orders and follow up as documented in patient record.    - buPROPion (WELLBUTRIN SR) 150 MG 12 hr tablet; Take 1 tablet (150 mg total) by mouth daily.  Dispense: 30 tablet; Refill: 0 - topiramate (TOPAMAX) 50 MG tablet; Take 1 tablet (50 mg total) by mouth daily with supper.  Dispense: 30 tablet; Refill: 0  4. Obesity BMI today is 37.8 Jacqueline Good is currently in the action stage of change. As such, her goal is to continue with weight loss efforts. She has agreed to the Category 3 Plan.   Jacqueline Good will continue meal planning and she will continue intentional eating. She will increase her water and protein intake.  Exercise goals:  As is.  Behavioral modification strategies: increasing lean protein intake, decreasing simple carbohydrates, increasing vegetables, increasing water intake, decreasing eating out, no skipping meals, meal planning and cooking strategies, keeping healthy foods in the home, and planning for success.  Jacqueline Good has agreed to follow-up with our clinic in 3 weeks. She was informed of the importance  of frequent follow-up visits to maximize her success with intensive lifestyle modifications for her multiple health conditions.   Objective:   Blood pressure 100/72, pulse 86, temperature 98.7 F (37.1 C), height 5\' 4"  (1.626 m), weight 220 lb (99.8 kg), SpO2 97 %. Body mass index is 37.76 kg/m.  General: Cooperative, alert, well developed, in no acute  distress. HEENT: Conjunctivae and lids unremarkable. Cardiovascular: Regular rhythm.  Lungs: Normal work of breathing. Neurologic: No focal deficits.   Lab Results  Component Value Date   CREATININE 1.03 (H) 06/27/2021   BUN 23 06/27/2021   NA 143 06/27/2021   K 4.0 06/27/2021   CL 109 06/27/2021   CO2 23 06/27/2021   Lab Results  Component Value Date   ALT 15 06/27/2021   AST 15 06/27/2021   ALKPHOS 91 06/27/2021   BILITOT 0.6 06/27/2021   Lab Results  Component Value Date   HGBA1C 5.5 03/21/2021   HGBA1C 5.4 04/27/2014   Lab Results  Component Value Date   INSULIN 31.4 (H) 03/21/2021   Lab Results  Component Value Date   TSH 2.200 03/21/2021   Lab Results  Component Value Date   CHOL 175 03/21/2021   HDL 44 03/21/2021   LDLCALC 93 03/21/2021   TRIG 227 (H) 03/21/2021   CHOLHDL 3.2 02/18/2019   Lab Results  Component Value Date   VD25OH 14.4 (L) 03/21/2021   Lab Results  Component Value Date   WBC 7.2 06/27/2021   HGB 13.0 06/27/2021   HCT 40.2 06/27/2021   MCV 87.2 06/27/2021   PLT 244 06/27/2021   No results found for: IRON, TIBC, FERRITIN  Attestation Statements:   Reviewed by clinician on day of visit: allergies, medications, problem list, medical history, surgical history, family history, social history, and previous encounter notes.  I, Lizbeth Bark, RMA, am acting as Location manager for [employer], DO.  I have reviewed the above documentation for accuracy and completeness, and I agree with the above. Jearld Lesch, DO

## 2021-08-06 ENCOUNTER — Encounter (INDEPENDENT_AMBULATORY_CARE_PROVIDER_SITE_OTHER): Payer: Self-pay | Admitting: Bariatrics

## 2021-08-06 ENCOUNTER — Other Ambulatory Visit (HOSPITAL_COMMUNITY): Payer: Self-pay

## 2021-08-06 DIAGNOSIS — K222 Esophageal obstruction: Secondary | ICD-10-CM | POA: Insufficient documentation

## 2021-08-06 DIAGNOSIS — Z853 Personal history of malignant neoplasm of breast: Secondary | ICD-10-CM | POA: Insufficient documentation

## 2021-08-06 DIAGNOSIS — K2990 Gastroduodenitis, unspecified, without bleeding: Secondary | ICD-10-CM | POA: Insufficient documentation

## 2021-08-06 DIAGNOSIS — R1310 Dysphagia, unspecified: Secondary | ICD-10-CM | POA: Insufficient documentation

## 2021-08-06 DIAGNOSIS — K2930 Chronic superficial gastritis without bleeding: Secondary | ICD-10-CM | POA: Insufficient documentation

## 2021-08-12 ENCOUNTER — Other Ambulatory Visit (HOSPITAL_COMMUNITY): Payer: Self-pay

## 2021-08-15 ENCOUNTER — Encounter: Payer: Self-pay | Admitting: Oncology

## 2021-08-15 ENCOUNTER — Encounter: Payer: Self-pay | Admitting: Adult Health

## 2021-08-15 ENCOUNTER — Other Ambulatory Visit (HOSPITAL_COMMUNITY): Payer: Self-pay

## 2021-08-15 MED ORDER — TIZANIDINE HCL 4 MG PO TABS
4.0000 mg | ORAL_TABLET | Freq: Two times a day (BID) | ORAL | 0 refills | Status: DC | PRN
  Filled 2021-08-15: qty 30, 15d supply, fill #0

## 2021-08-21 ENCOUNTER — Other Ambulatory Visit (HOSPITAL_COMMUNITY): Payer: Self-pay

## 2021-08-22 ENCOUNTER — Ambulatory Visit (INDEPENDENT_AMBULATORY_CARE_PROVIDER_SITE_OTHER): Payer: Commercial Managed Care - HMO | Admitting: Bariatrics

## 2021-08-22 ENCOUNTER — Other Ambulatory Visit (HOSPITAL_COMMUNITY): Payer: Self-pay

## 2021-08-22 ENCOUNTER — Other Ambulatory Visit: Payer: Self-pay

## 2021-08-22 ENCOUNTER — Encounter (INDEPENDENT_AMBULATORY_CARE_PROVIDER_SITE_OTHER): Payer: Self-pay | Admitting: Bariatrics

## 2021-08-22 VITALS — BP 129/85 | HR 73 | Temp 98.9°F | Ht 64.0 in | Wt 220.0 lb

## 2021-08-22 DIAGNOSIS — Z6837 Body mass index (BMI) 37.0-37.9, adult: Secondary | ICD-10-CM | POA: Diagnosis not present

## 2021-08-22 DIAGNOSIS — E8881 Metabolic syndrome: Secondary | ICD-10-CM

## 2021-08-22 DIAGNOSIS — F3289 Other specified depressive episodes: Secondary | ICD-10-CM | POA: Diagnosis not present

## 2021-08-22 MED ORDER — BUPROPION HCL ER (SR) 150 MG PO TB12
150.0000 mg | ORAL_TABLET | Freq: Every day | ORAL | 0 refills | Status: DC
  Filled 2021-08-22 – 2021-09-28 (×2): qty 30, 30d supply, fill #0

## 2021-08-22 MED ORDER — TOPIRAMATE 50 MG PO TABS
50.0000 mg | ORAL_TABLET | Freq: Every day | ORAL | 0 refills | Status: DC
  Filled 2021-08-22 – 2021-09-28 (×2): qty 30, 30d supply, fill #0

## 2021-08-22 MED ORDER — METFORMIN HCL 500 MG PO TABS
500.0000 mg | ORAL_TABLET | Freq: Two times a day (BID) | ORAL | 0 refills | Status: DC
  Filled 2021-08-22 – 2021-09-09 (×2): qty 60, 30d supply, fill #0

## 2021-08-23 ENCOUNTER — Other Ambulatory Visit (HOSPITAL_COMMUNITY): Payer: Self-pay

## 2021-08-26 ENCOUNTER — Encounter (INDEPENDENT_AMBULATORY_CARE_PROVIDER_SITE_OTHER): Payer: Self-pay | Admitting: Bariatrics

## 2021-08-26 NOTE — Progress Notes (Signed)
Chief Complaint:   OBESITY Jacqueline Good is here to discuss her progress with her obesity treatment plan along with follow-up of her obesity related diagnoses. Jacqueline Good is on the Category 3 Plan and states she is following her eating plan approximately 50% of the time. Jacqueline Good states she is doing chair exercise for 7 minutes 2-3 times per week.  Today's visit was #: 10 Starting weight: 238 lbs Starting date: 03/21/2021 Today's weight: 220 lbs Today's date: 08/22/2021 Total lbs lost to date: 18 lbs Total lbs lost since last in-office visit: 0  Interim History: Jacqueline Good's weight stayed the same over the holidays. She is getting more protein and vegetables.  Subjective:   1. Insulin resistance Jacqueline Good is taking her medications as directed.  2. Other depression, emotional eating Jacqueline Good states Wellbutrin helps with stress eating.   Assessment/Plan:   1. Insulin resistance We will refill Metformin 500 2 times daily with no refills. Tamyah will continue to work on weight loss, exercise, and decreasing simple carbohydrates to help decrease the risk of diabetes. Jacqueline Good agreed to follow-up with Jacqueline Good as directed to closely monitor her progress.  - metFORMIN (GLUCOPHAGE) 500 MG tablet; Take 1 tablet by mouth 2  times daily with a meal.  Dispense: 60 tablet; Refill: 0  2. Other depression, emotional eating We will refill Wellbutrin SR 150 mg for 1 month with no refills. We will refill Topamax 50 mg for 1 month with no refills. Behavior modification techniques were discussed today to help Jacqueline Good deal with her emotional/non-hunger eating behaviors.  Orders and follow up as documented in patient record.    - buPROPion (WELLBUTRIN SR) 150 MG 12 hr tablet; Take 1 tablet by mouth daily.  Dispense: 30 tablet; Refill: 0 - topiramate (TOPAMAX) 50 MG tablet; Take 1 tablet by mouth daily with supper.  Dispense: 30 tablet; Refill: 0  3. Obesity BMI today is 37.8 Jacqueline Good is currently in the action stage  of change. As such, her goal is to continue with weight loss efforts. She has agreed to the Category 3 Plan and keeping a food journal and adhering to recommended goals of 1500 calories and 80-90 grams of protein.   Jacqueline Good will continue meal planning and she will continue intentional eating. She will get back on track.   Exercise goals:  As is.  Behavioral modification strategies: increasing lean protein intake, decreasing simple carbohydrates, increasing vegetables, increasing water intake, decreasing eating out, no skipping meals, meal planning and cooking strategies, keeping healthy foods in the home, and planning for success.  Jacqueline Good has agreed to follow-up with our clinic in 3-4 weeks. She was informed of the importance of frequent follow-up visits to maximize her success with intensive lifestyle modifications for her multiple health conditions.   Objective:   Blood pressure 129/85, pulse 73, temperature 98.9 F (37.2 C), height 5\' 4"  (1.626 m), weight 220 lb (99.8 kg), SpO2 97 %. Body mass index is 37.76 kg/m.  General: Cooperative, alert, well developed, in no acute distress. HEENT: Conjunctivae and lids unremarkable. Cardiovascular: Regular rhythm.  Lungs: Normal work of breathing. Neurologic: No focal deficits.   Lab Results  Component Value Date   CREATININE 1.03 (H) 06/27/2021   BUN 23 06/27/2021   NA 143 06/27/2021   K 4.0 06/27/2021   CL 109 06/27/2021   CO2 23 06/27/2021   Lab Results  Component Value Date   ALT 15 06/27/2021   AST 15 06/27/2021   ALKPHOS 91 06/27/2021   BILITOT 0.6 06/27/2021  Lab Results  Component Value Date   HGBA1C 5.5 03/21/2021   HGBA1C 5.4 04/27/2014   Lab Results  Component Value Date   INSULIN 31.4 (H) 03/21/2021   Lab Results  Component Value Date   TSH 2.200 03/21/2021   Lab Results  Component Value Date   CHOL 175 03/21/2021   HDL 44 03/21/2021   LDLCALC 93 03/21/2021   TRIG 227 (H) 03/21/2021   CHOLHDL 3.2  02/18/2019   Lab Results  Component Value Date   VD25OH 14.4 (L) 03/21/2021   Lab Results  Component Value Date   WBC 7.2 06/27/2021   HGB 13.0 06/27/2021   HCT 40.2 06/27/2021   MCV 87.2 06/27/2021   PLT 244 06/27/2021   No results found for: IRON, TIBC, FERRITIN  Attestation Statements:   Reviewed by clinician on day of visit: allergies, medications, problem list, medical history, surgical history, family history, social history, and previous encounter notes.  I, Lizbeth Bark, RMA, am acting as Location manager for [employer], DO.  I have reviewed the above documentation for accuracy and completeness, and I agree with the above. Jearld Lesch, DO

## 2021-09-03 ENCOUNTER — Encounter: Payer: Self-pay | Admitting: Oncology

## 2021-09-03 ENCOUNTER — Encounter: Payer: Self-pay | Admitting: Adult Health

## 2021-09-05 ENCOUNTER — Encounter: Payer: Self-pay | Admitting: Adult Health

## 2021-09-05 ENCOUNTER — Encounter: Payer: Self-pay | Admitting: Oncology

## 2021-09-09 ENCOUNTER — Other Ambulatory Visit (HOSPITAL_COMMUNITY): Payer: Self-pay

## 2021-09-12 ENCOUNTER — Ambulatory Visit (INDEPENDENT_AMBULATORY_CARE_PROVIDER_SITE_OTHER): Payer: Commercial Managed Care - HMO | Admitting: Bariatrics

## 2021-09-14 ENCOUNTER — Other Ambulatory Visit (HOSPITAL_COMMUNITY): Payer: Self-pay

## 2021-09-19 NOTE — Progress Notes (Signed)
Virtual Visit via Video Note  I connected with Jacqueline Good on 09/23/21 at  4:00 PM EST by a video enabled telemedicine application and verified that I am speaking with the correct person using two identifiers.  Location: Patient: home Provider: office Persons participated in the visit- patient, provider    I discussed the limitations of evaluation and management by telemedicine and the availability of in person appointments. The patient expressed understanding and agreed to proceed.   I discussed the assessment and treatment plan with the patient. The patient was provided an opportunity to ask questions and all were answered. The patient agreed with the plan and demonstrated an understanding of the instructions.   The patient was advised to call back or seek an in-person evaluation if the symptoms worsen or if the condition fails to improve as anticipated.  I provided 16 minutes of non-face-to-face time during this encounter.   Norman Clay, MD    [facility] MD/PA/NP OP Progress Note  09/23/2021 4:32 PM Jacqueline Good  MRN:  [id number]  Chief Complaint:  Chief Complaint   Follow-up; Depression    HPI:  This is a follow-up appointment for depression, anxiety and insomnia.  She states that she has been doing "fine."  She does not have any routine, stating that she visits her mother, and stays in the home otherwise.  She does not have any goals for the future, stating that she is doing good as it is.  However, on further elaboration, she states that she feels "depressed all the time."  She also has had difficulty in losing weight. She does not feel comfortable taking a walk due to some incident happened a few times outside, although she does not elaborate this.  Having explored the way she can be active, she states that she may try in the future, although she is not currently in the frame of mind to try at this time.  She has depressive symptoms as in PHQ-9.  She denies SI.  She tends to  be more anxious when she goes outside.  She had a few panic attacks since the last visit.  She has not taken topiramate nor bupropion as it has limited benefit for her.  She feels comfortable to stay on the current medication regimen at this time.   Daily routine: she denies having routine, doing housed hold chores, visiting her mother  Employment: used to work as Research scientist (physical sciences), last in 2017; although the patient was terminated, it was a good timing for the patient as she was taking care of her cousin, who later moved into nursing home.  Support: Household: by herself. Her mother lives next door Marital status: single  Number of children:1 daughter, who lives in [place]. She has estranged relationship with the father of her daughter.  She grew up in [place].  Her parents got divorced when she was 30-year-old.  Although she did not like them arguing with each other, she reports good relationship with her mother, who raised the patient and other siblings.   Visit Diagnosis:    ICD-10-CM   1. MDD (major depressive disorder), recurrent episode, mild (HCC)  F33.0 mirtazapine (REMERON) 30 MG tablet    2. Generalized anxiety disorder  F41.1 mirtazapine (REMERON) 30 MG tablet    3. Insomnia, unspecified type  G47.00 zolpidem (AMBIEN) 10 MG tablet      Past Psychiatric History: Please see initial evaluation for full details. I have reviewed the history. No updates at this time.  Past Medical History:  Past Medical History:  Diagnosis Date   Anemia    Anxiety    Arthritis    "all over" (08/12/2018)   Asthma    Back pain    Breast cancer, right breast ([place]) dx'd 02/2018   Chronic bronchitis ([place])    Depression    Family history of breast cancer    Family history of prostate cancer    Family history of uterine cancer    GERD (gastroesophageal reflux disease)    History of blood transfusion    "several; related to low blood" (08/12/2018)   History of radiation therapy 11/15/18- 12/17/18   Right  Chest wall, SCV, PAB. 25 fractions.    Lymphedema of right arm    Palpitations    Personal history of chemotherapy    Personal history of radiation therapy     Past Surgical History:  Procedure Laterality Date   AXILLARY LYMPH NODE DISSECTION Right 08/12/2018   Procedure: AXILLARY LYMPH NODE DISSECTION;  Surgeon: Alphonsa Overall, MD;  Location: [place];  Service: General;  Laterality: Right;   BREAST BIOPSY Right 03/2018   BREAST RECONSTRUCTION WITH PLACEMENT OF TISSUE EXPANDER AND FLEX HD (ACELLULAR HYDRATED DERMIS) Right 08/12/2018   Procedure: RIGHT BREAST RECONSTRUCTION WITH PLACEMENT OF TISSUE EXPANDER AND FLEX HD (ACELLULAR HYDRATED DERMIS);  Surgeon: Wallace Going, DO;  Location: [place];  Service: Plastics;  Laterality: Right;   BREAST REDUCTION SURGERY Left 09/28/2019   Procedure: LEFT MAMMARY REDUCTION/MASTOPEXY  (BREAST);  Surgeon: Wallace Going, DO;  Location: [place];  Service: Plastics;  Laterality: Left;   DILATION AND CURETTAGE OF UTERUS     ENDOMETRIAL ABLATION     IR IMAGING GUIDED PORT INSERTION  03/18/2018   MASTECTOMY MODIFIED RADICAL Right 08/12/2018   Procedure: RIGHT MODIFIED RADICAL MASTECTOMY;  Surgeon: Alphonsa Overall, MD;  Location: [place];  Service: General;  Laterality: Right;   MASTECTOMY MODIFIED RADICAL Right 08/12/2018   w/axillary LND   MYOMECTOMY     REDUCTION MAMMAPLASTY Left 2021   REMOVAL OF TISSUE EXPANDER AND PLACEMENT OF IMPLANT Right 10/26/2018   Procedure: Removal of right breast expander;  Surgeon: Wallace Going, DO;  Location: [place];  Service: Plastics;  Laterality: Right;  75 min, please    Family Psychiatric History: Please see initial evaluation for full details. I have reviewed the history. No updates at this time.     Family History:  Family History  Problem Relation Age of Onset   Lupus Sister    Heart disease Sister    Breast cancer Mother 23   Prostate cancer Paternal Uncle    Diabetes Maternal Grandmother     Heart Problems Maternal Grandmother    Prostate cancer Maternal Grandfather    Prostate cancer Paternal Grandfather    Prostate cancer Other        MGFs brother   Breast cancer Other        Mat great-grandmother's sister   Uterine cancer Maternal Great-grandmother    Prostate cancer Other        PGFs brother    Social History:  Social History   Socioeconomic History   Marital status: Single    Spouse name: Not on file   Number of children: Not on file   Years of education: Not on file   Highest education level: Not on file  Occupational History   Not on file  Tobacco Use   Smoking status: Former    Packs/day: 1.00  Years: 38.00    Pack years: 38.00    Types: Cigarettes    Quit date: 05/15/2018    Years since quitting: 3.3   Smokeless tobacco: Never  Vaping Use   Vaping Use: Never used  Substance and Sexual Activity   Alcohol use: Yes    Comment: occ   Drug use: No   Sexual activity: Not Currently    Birth control/protection: None  Other Topics Concern   Not on file  Social History Narrative   Not on file   Social Determinants of Health   Financial Resource Strain: Not on file  Food Insecurity: Not on file  Transportation Needs: Not on file  Physical Activity: Not on file  Stress: Not on file  Social Connections: Not on file    Allergies:  Allergies  Allergen Reactions   Gadolinium Derivatives Itching and Cough    Pt began sneezing and coughing as soon as MRI contrast was injected. Severe nasal congestion. No rash or hives no SOB.    Hydrocodone Hives and Itching    Pt says she can take it with benadryl.    Iodinated Contrast Media Itching    Metabolic Disorder Labs: Lab Results  Component Value Date   HGBA1C 5.5 03/21/2021   No results found for: PROLACTIN Lab Results  Component Value Date   CHOL 175 03/21/2021   TRIG 227 (H) 03/21/2021   HDL 44 03/21/2021   CHOLHDL 3.2 02/18/2019   VLDL 23 02/18/2019   LDLCALC 93 03/21/2021    LDLCALC 115 (H) 02/18/2019   Lab Results  Component Value Date   TSH 2.200 03/21/2021   TSH 0.58 09/24/2016    Therapeutic Level Labs: No results found for: LITHIUM No results found for: VALPROATE No components found for:  CBMZ  Current Medications: Current Outpatient Medications  Medication Sig Dispense Refill   Albuterol Sulfate (PROAIR RESPICLICK) 470 (90 Base) MCG/ACT AEPB Inhale 1 puff every 4 hours as needed 1 each 0   [START ON 10/12/2021] ALPRAZolam (XANAX) 1 MG tablet TAKE 1 TABLET BY MOUTH 3 TIMES DAILY AND 1/2 TABLET AS NEEDED FOR ANXIETY (MAX 3.5MG  DAILY) 105 tablet 2   buPROPion (WELLBUTRIN SR) 150 MG 12 hr tablet Take 1 tablet by mouth daily. 30 tablet 0   esomeprazole (NEXIUM) 40 MG capsule TAKE 1 CAPSULE (40 MG TOTAL) BY MOUTH DAILY. 30 capsule 2   loratadine (CLARITIN) 10 MG tablet Take 1 tablet (10 mg total) by mouth daily as needed for allergies. 30 tablet 11   lurasidone (LATUDA) 40 MG TABS tablet Take 1 tablet (40 mg total) by mouth daily with breakfast. Do not fill before 08/22/21. 90 tablet 1   meloxicam (MOBIC) 15 MG tablet TAKE 1 TABLET BY MOUTH ONCE A DAY 30 tablet 3   metFORMIN (GLUCOPHAGE) 500 MG tablet Take 1 tablet by mouth 2  times daily with a meal. 60 tablet 0   [START ON 10/12/2021] mirtazapine (REMERON) 30 MG tablet Take 1 tablet (30 mg total) by mouth at bedtime. 90 tablet 1   Multiple Vitamin (MULTIVITAMIN) capsule Take 1 capsule by mouth daily.     omeprazole (PRILOSEC) 40 MG capsule TAKE 1 CAPSULE BY MOUTH DAILY. 30 capsule 2   propranolol (INDERAL) 40 MG tablet Take 40 mg by mouth daily as needed (heart palpitation.).      tiZANidine (ZANAFLEX) 4 MG tablet Take 1 tablet by mouth twice daily as needed. 30 tablet 0   topiramate (TOPAMAX) 50 MG tablet Take 1 tablet  by mouth daily with supper. (Patient not taking: Reported on 09/23/2021) 30 tablet 0   [START ON 10/12/2021] venlafaxine XR (EFFEXOR-XR) 75 MG 24 hr capsule Take 3 capsules (225 mg total) by mouth  daily with breakfast. 270 capsule 1   Vitamin D, Ergocalciferol, (DRISDOL) 1.25 MG (50000 UNIT) CAPS capsule Take 1 capsule by mouth every 7 days. 4 capsule 0   [START ON 10/15/2021] zolpidem (AMBIEN) 10 MG tablet Take 1 tablet (10 mg total) by mouth at bedtime as needed. 30 tablet 2   No current facility-administered medications for this visit.     Musculoskeletal: Strength & Muscle Tone:  N/A Gait & Station:  N/A Patient leans: N/A  Psychiatric Specialty Exam: Review of Systems  Psychiatric/Behavioral:  Positive for decreased concentration, dysphoric mood and sleep disturbance. Negative for agitation, behavioral problems, confusion, hallucinations, self-injury and suicidal ideas. The patient is nervous/anxious. The patient is not hyperactive.   All other systems reviewed and are negative.  There were no vitals taken for this visit.There is no height or weight on file to calculate BMI.  General Appearance: Fairly Groomed  Eye Contact:  Good  Speech:  Clear and Coherent  Volume:  Normal  Mood:  Depressed  Affect:  Appropriate, Congruent, and calm  Thought Process:  Coherent  Orientation:  Full (Time, Place, and Person)  Thought Content: Logical   Suicidal Thoughts:  No  Homicidal Thoughts:  No  Memory:  Immediate;   Good  Judgement:  Good  Insight:  Fair  Psychomotor Activity:  Normal  Concentration:  Concentration: Good and Attention Span: Good  Recall:  Good  Fund of Knowledge: Good  Language: Good  Akathisia:  No  Handed:  Right  AIMS (if indicated): not done  Assets:  Communication Skills Desire for Improvement  ADL's:  Intact  Cognition: WNL  Sleep:  Fair   Screenings: GAD-7    [place] Office Visit from 04/22/2018 in [place] Office Visit from 06/03/2017 in [place] Office Visit from 09/24/2016 in [place] Office Visit from 03/27/2016 in [place]  Total GAD-7 Score 11 3 15 16       PHQ2-9    Flowsheet Row Video Visit from 09/23/2021 in [place] Video Visit from 04/09/2021 in [place] Office Visit from 03/21/2021 in [facility] Video Visit from 11/07/2020 in [place] Office Visit from 04/22/2018 in [facility]  PHQ-2 Total Score 4 6 1 4 5   PHQ-9 Total Score 9 11 4 11 15       Flowsheet Row Video Visit from 01/08/2021 in [place] No Risk        Assessment and Plan:  VIKKI GAINS is a 61 y.o. year old female with a history of depression, OCD, estrogen negative T2 pN1, stage IIB invasive ductal carcinoma, diagnosed 02/25/2018 s/p mastectomy 08/2018, neoadjuvant chemo immunotherapy/on trastzumab, asthma, OA, chronic back pain , who presents for follow up appointment for below.   1. MDD (major depressive disorder), recurrent episode, mild ([place]) 2. Generalized anxiety disorder She continues to report depressive symptoms and an anxiety since the last visit.  Psychosocial stressors includes her medical condition, unemployment, chronic pain, and taking care of her mother with medical condition.  Will continue venlafaxine, mirtazapine, Latuda to target depression.  Will continue Xanax as needed for  anxiety. Noted she will greatly benefit from CBT , she is not interested in this option .   3. Insomnia, unspecified type She has good benefit from Ambien.  Will continue current dose to target insomnia.  She is not interested in evaluation of sleep apnea, although it was recommended due to her snoring in the past.   # inattention Unchanged. She has a history of worsening in concentration over the past few years before chemotherapy.  No history of IEP/ADHD.  The etiology is likely multifactorial given her ongoing mood symptoms.  She is  not interested in a neuropsychological evaluation.    This clinician has discussed the side effect associated with medication prescribed during this encounter. Please refer to notes in the previous encounters for more details.    Plan Continue venlafaxine 225 mg daily Continue Latuda 40 mg, take at night - monitor weight gain Continue mirtazapine 15 mg at night  Continue Xanax 1 mg three times a day (and additional 0.5 mg daily) as needed for anxiety    Continue Ambien 10 mg at night as needed for insomnia Next appointment: 5/9  at 4 PM for 30 mins, video. She declined in person visit as she does not drive highway.   Past trials of medication: sertraline, fluoxetine, lexapro, Buspar, venlafaxine, duloxetine, bupropion, quetiapine, Abilify   The patient demonstrates the following risk factors for suicide: Chronic risk factors for suicide include: psychiatric disorder of depression and medical illness of breast cancer. Acute risk factors for suicide include: unemployment and loss (financial, interpersonal, professional). Protective factors for this patient include: positive social support, responsibility to others (children, family), coping skills and hope for the future. Considering these factors, the overall suicide risk at this point appears to be low. Patient is appropriate for outpatient follow up.    Norman Clay, MD 09/23/2021, 4:32 PM

## 2021-09-23 ENCOUNTER — Other Ambulatory Visit (HOSPITAL_COMMUNITY): Payer: Self-pay

## 2021-09-23 ENCOUNTER — Other Ambulatory Visit: Payer: Self-pay

## 2021-09-23 ENCOUNTER — Encounter: Payer: Self-pay | Admitting: Psychiatry

## 2021-09-23 ENCOUNTER — Telehealth (INDEPENDENT_AMBULATORY_CARE_PROVIDER_SITE_OTHER): Payer: Medicare Other | Admitting: Psychiatry

## 2021-09-23 DIAGNOSIS — F330 Major depressive disorder, recurrent, mild: Secondary | ICD-10-CM | POA: Diagnosis not present

## 2021-09-23 DIAGNOSIS — G4700 Insomnia, unspecified: Secondary | ICD-10-CM | POA: Diagnosis not present

## 2021-09-23 DIAGNOSIS — F411 Generalized anxiety disorder: Secondary | ICD-10-CM | POA: Diagnosis not present

## 2021-09-23 MED ORDER — MIRTAZAPINE 30 MG PO TABS
30.0000 mg | ORAL_TABLET | Freq: Every day | ORAL | 1 refills | Status: DC
  Filled 2021-09-23 – 2021-10-16 (×2): qty 90, 90d supply, fill #0
  Filled 2021-12-14 – 2021-12-25 (×3): qty 90, 90d supply, fill #1

## 2021-09-23 MED ORDER — VENLAFAXINE HCL ER 75 MG PO CP24
225.0000 mg | ORAL_CAPSULE | Freq: Every day | ORAL | 1 refills | Status: DC
  Filled 2021-09-23 – 2021-10-16 (×2): qty 270, 90d supply, fill #0
  Filled 2021-12-17 – 2021-12-25 (×2): qty 270, 90d supply, fill #1

## 2021-09-23 MED ORDER — ZOLPIDEM TARTRATE 10 MG PO TABS
10.0000 mg | ORAL_TABLET | Freq: Every evening | ORAL | 2 refills | Status: DC | PRN
  Filled 2021-09-23 – 2021-10-16 (×2): qty 30, 30d supply, fill #0
  Filled 2021-11-18: qty 30, 30d supply, fill #1
  Filled 2021-12-17: qty 30, 30d supply, fill #2

## 2021-09-23 MED ORDER — ALPRAZOLAM 1 MG PO TABS
ORAL_TABLET | ORAL | 2 refills | Status: DC
  Filled 2021-09-23: qty 105, fill #0
  Filled 2021-10-16: qty 105, 30d supply, fill #0
  Filled 2021-11-18: qty 105, 30d supply, fill #1
  Filled 2021-12-17: qty 105, 30d supply, fill #2

## 2021-09-23 NOTE — Patient Instructions (Signed)
Continue venlafaxine 225 mg daily Continue Latuda 40 mg, take at nightn Continue mirtazapine 15 mg at night  Continue Xanax 1 mg three times a day (and additional 0.5 mg daily) as needed for anxiety    Continue Ambien 10 mg at night as needed for insomnia Next appointment: 5/9  at 4 PM

## 2021-09-24 ENCOUNTER — Other Ambulatory Visit (HOSPITAL_COMMUNITY): Payer: Self-pay

## 2021-09-28 ENCOUNTER — Other Ambulatory Visit (HOSPITAL_COMMUNITY): Payer: Self-pay

## 2021-10-03 ENCOUNTER — Other Ambulatory Visit (HOSPITAL_COMMUNITY): Payer: Self-pay

## 2021-10-08 ENCOUNTER — Other Ambulatory Visit (HOSPITAL_BASED_OUTPATIENT_CLINIC_OR_DEPARTMENT_OTHER): Payer: Self-pay

## 2021-10-09 ENCOUNTER — Other Ambulatory Visit (HOSPITAL_COMMUNITY): Payer: Self-pay

## 2021-10-10 ENCOUNTER — Other Ambulatory Visit (HOSPITAL_COMMUNITY): Payer: Self-pay

## 2021-10-15 ENCOUNTER — Telehealth (HOSPITAL_COMMUNITY): Payer: Self-pay | Admitting: *Deleted

## 2021-10-15 NOTE — Telephone Encounter (Signed)
GoodRx nor coupon are of value really for this medication. And Latuda coupon for new pt's. She has Medicare. I'll double check to see if she needs a PA possibly although I haven't received one for her.

## 2021-10-15 NOTE — Telephone Encounter (Signed)
Got it, thanks!

## 2021-10-15 NOTE — Telephone Encounter (Signed)
I am wondering if there is any change in her insurance as she has been on the current dose of Latuda for more than an year. Could you ask if she will be able to use coupon on the website, or use Good Rx?  If not, may consider switching to Rexulti, but this medication can be also expensive depending on her insurance.

## 2021-10-15 NOTE — Telephone Encounter (Signed)
Writer spoke with pt who is upset that the [name] is so expensive, hundreds of dollars, to fill. Pt asked that the medication be switched to something more cost friendly. Pt has an appointment scheduled on 12/17/21. Please review and advise. Thanks. ?

## 2021-10-16 ENCOUNTER — Other Ambulatory Visit (HOSPITAL_COMMUNITY): Payer: Self-pay

## 2021-10-16 ENCOUNTER — Other Ambulatory Visit (INDEPENDENT_AMBULATORY_CARE_PROVIDER_SITE_OTHER): Payer: Self-pay | Admitting: Bariatrics

## 2021-10-16 DIAGNOSIS — E8881 Metabolic syndrome: Secondary | ICD-10-CM

## 2021-10-16 NOTE — Telephone Encounter (Signed)
Dr.Brown 

## 2021-10-17 ENCOUNTER — Other Ambulatory Visit (HOSPITAL_COMMUNITY): Payer: Self-pay

## 2021-10-17 NOTE — Telephone Encounter (Signed)
I have attempted to return pt call several times however phone goes to VM which is not set up.

## 2021-10-19 ENCOUNTER — Other Ambulatory Visit (HOSPITAL_COMMUNITY): Payer: Self-pay

## 2021-10-23 ENCOUNTER — Other Ambulatory Visit (HOSPITAL_COMMUNITY): Payer: Self-pay

## 2021-10-23 ENCOUNTER — Other Ambulatory Visit: Payer: Self-pay

## 2021-10-23 ENCOUNTER — Ambulatory Visit (INDEPENDENT_AMBULATORY_CARE_PROVIDER_SITE_OTHER): Payer: Medicare Other | Admitting: Bariatrics

## 2021-10-23 ENCOUNTER — Encounter (INDEPENDENT_AMBULATORY_CARE_PROVIDER_SITE_OTHER): Payer: Self-pay | Admitting: Bariatrics

## 2021-10-23 VITALS — BP 102/71 | HR 75 | Temp 98.3°F | Ht 64.0 in | Wt 216.0 lb

## 2021-10-23 DIAGNOSIS — E669 Obesity, unspecified: Secondary | ICD-10-CM

## 2021-10-23 DIAGNOSIS — E8881 Metabolic syndrome: Secondary | ICD-10-CM

## 2021-10-23 DIAGNOSIS — Z6837 Body mass index (BMI) 37.0-37.9, adult: Secondary | ICD-10-CM | POA: Diagnosis not present

## 2021-10-23 DIAGNOSIS — Z6841 Body Mass Index (BMI) 40.0 and over, adult: Secondary | ICD-10-CM

## 2021-10-23 DIAGNOSIS — E88819 Insulin resistance, unspecified: Secondary | ICD-10-CM

## 2021-10-23 DIAGNOSIS — F3289 Other specified depressive episodes: Secondary | ICD-10-CM

## 2021-10-23 MED ORDER — METFORMIN HCL 500 MG PO TABS
500.0000 mg | ORAL_TABLET | Freq: Two times a day (BID) | ORAL | 0 refills | Status: DC
Start: 2021-10-23 — End: 2021-11-27
  Filled 2021-10-23: qty 60, 30d supply, fill #0

## 2021-10-23 MED ORDER — BUPROPION HCL ER (SR) 150 MG PO TB12
150.0000 mg | ORAL_TABLET | Freq: Every day | ORAL | 0 refills | Status: DC
  Filled 2021-10-23: qty 30, 30d supply, fill #0

## 2021-10-24 ENCOUNTER — Encounter (INDEPENDENT_AMBULATORY_CARE_PROVIDER_SITE_OTHER): Payer: Self-pay | Admitting: Bariatrics

## 2021-10-24 NOTE — Progress Notes (Signed)
? ? ? ?Chief Complaint:  ? ?OBESITY ?Francia is here to discuss her progress with her obesity treatment plan along with follow-up of her obesity related diagnoses. Sidda is on the Category 1 Plan and states she is following her eating plan approximately 50% of the time. Paisley states she is doing chair exercise for 5 minutes 3 times per week. ? ?Today's visit was #: 27 ?Starting weight: 238 lbs ?Starting date: 03/21/2021 ?Today's weight: 215 lbs ?Today's date: 10/23/2021 ?Total lbs lost to date: 23 lbs ?Total lbs lost since last in-office visit: 5 lbs ? ?Interim History: [place] is down 5 additional pounds since her last visit. She is getting adequate water and protein.  ? ?Subjective:  ? ?1. Insulin resistance ?Mickaela's last insulin was 31.4 on 03/21/2021. It is normal today.   ? ?2. Other depression, emotional eating ?Payden's emotional eating has improved.  ? ?Assessment/Plan:  ? ?1. Insulin resistance ?We will refill Metformin 500 mg twice daily for 1 month with no refills. Eulonda will continue to work on weight loss, exercise, and decreasing simple carbohydrates to help decrease the risk of diabetes. Milea agreed to follow-up with [place] as directed to closely monitor her progress. ? ?- metFORMIN (GLUCOPHAGE) 500 MG tablet; Take 1 tablet by mouth 2  times daily with a meal.  Dispense: 60 tablet; Refill: 0 ? ?2. Other depression, emotional eating ?We will refill Wellburtin SR 150 mg for 1 month with no refills Behavior modification techniques were discussed today to help Declyn deal with her emotional/non-hunger eating behaviors.  Orders and follow up as documented in patient record.  ? ?- buPROPion (WELLBUTRIN SR) 150 MG 12 hr tablet; Take 1 tablet by mouth daily.  Dispense: 30 tablet; Refill: 0 ? ?3. Obesity, with current BMI of 37.0 ?Zamari is currently in the action stage of change. As such, her goal is to continue with weight loss efforts. She has agreed to the Category 1 Plan.  ? ?[place] will continue  meal planning and she will continue intentional eating.  ? ?Exercise goals:  Anaih will continue chair exercise.  ? ?Behavioral modification strategies: increasing lean protein intake, decreasing simple carbohydrates, increasing vegetables, increasing water intake, decreasing eating out, no skipping meals, meal planning and cooking strategies, keeping healthy foods in the home, and planning for success. ? ?Mane has agreed to follow-up with our clinic in 3-4 weeks (fasting). She was informed of the importance of frequent follow-up visits to maximize her success with intensive lifestyle modifications for her multiple health conditions.  ? ?Objective:  ? ?Blood pressure 102/71, pulse 75, temperature 98.3 ?F (36.8 ?C), height '5\' 4"'$  (1.626 m), weight 216 lb (98 kg), SpO2 96 %. ?Body mass index is 37.08 kg/m?. ? ?General: Cooperative, alert, well developed, in no acute distress. ?HEENT: Conjunctivae and lids unremarkable. ?Cardiovascular: Regular rhythm.  ?Lungs: Normal work of breathing. ?Neurologic: No focal deficits.  ? ?Lab Results  ?Component Value Date  ? CREATININE 1.03 (H) 06/27/2021  ? BUN 23 06/27/2021  ? NA 143 06/27/2021  ? K 4.0 06/27/2021  ? CL 109 06/27/2021  ? CO2 23 06/27/2021  ? ?Lab Results  ?Component Value Date  ? ALT 15 06/27/2021  ? AST 15 06/27/2021  ? ALKPHOS 91 06/27/2021  ? BILITOT 0.6 06/27/2021  ? ?Lab Results  ?Component Value Date  ? HGBA1C 5.5 03/21/2021  ? HGBA1C 5.4 04/27/2014  ? ?Lab Results  ?Component Value Date  ? INSULIN 31.4 (H) 03/21/2021  ? ?Lab Results  ?Component Value Date  ?  TSH 2.200 03/21/2021  ? ?Lab Results  ?Component Value Date  ? CHOL 175 03/21/2021  ? HDL 44 03/21/2021  ? [place] 93 03/21/2021  ? TRIG 227 (H) 03/21/2021  ? CHOLHDL 3.2 02/18/2019  ? ?Lab Results  ?Component Value Date  ? VD25OH 14.4 (L) 03/21/2021  ? ?Lab Results  ?Component Value Date  ? WBC 7.2 06/27/2021  ? HGB 13.0 06/27/2021  ? HCT 40.2 06/27/2021  ? MCV 87.2 06/27/2021  ? PLT 244 06/27/2021   ? ?No results found for: IRON, TIBC, FERRITIN ? ?Attestation Statements:  ? ?Reviewed by clinician on day of visit: allergies, medications, problem list, medical history, surgical history, family history, social history, and previous encounter notes. ? ?I, Lizbeth Bark, RMA, am acting as transcriptionist for [employer], DO. ? ?I have reviewed the above documentation for accuracy and completeness, and I agree with the above. Jearld Lesch, DO ? ?

## 2021-10-30 ENCOUNTER — Other Ambulatory Visit (HOSPITAL_COMMUNITY): Payer: Self-pay

## 2021-11-09 ENCOUNTER — Other Ambulatory Visit (HOSPITAL_COMMUNITY): Payer: Self-pay

## 2021-11-14 DIAGNOSIS — H25813 Combined forms of age-related cataract, bilateral: Secondary | ICD-10-CM | POA: Diagnosis not present

## 2021-11-14 DIAGNOSIS — H524 Presbyopia: Secondary | ICD-10-CM | POA: Diagnosis not present

## 2021-11-18 ENCOUNTER — Other Ambulatory Visit (HOSPITAL_COMMUNITY): Payer: Self-pay

## 2021-11-27 ENCOUNTER — Ambulatory Visit (INDEPENDENT_AMBULATORY_CARE_PROVIDER_SITE_OTHER): Payer: Medicare Other | Admitting: Bariatrics

## 2021-11-27 ENCOUNTER — Other Ambulatory Visit (HOSPITAL_COMMUNITY): Payer: Self-pay

## 2021-11-27 ENCOUNTER — Encounter (INDEPENDENT_AMBULATORY_CARE_PROVIDER_SITE_OTHER): Payer: Self-pay | Admitting: Bariatrics

## 2021-11-27 VITALS — BP 124/83 | HR 76 | Temp 98.1°F | Ht 64.0 in | Wt 214.0 lb

## 2021-11-27 DIAGNOSIS — E669 Obesity, unspecified: Secondary | ICD-10-CM | POA: Diagnosis not present

## 2021-11-27 DIAGNOSIS — E559 Vitamin D deficiency, unspecified: Secondary | ICD-10-CM

## 2021-11-27 DIAGNOSIS — F3289 Other specified depressive episodes: Secondary | ICD-10-CM

## 2021-11-27 DIAGNOSIS — E8881 Metabolic syndrome: Secondary | ICD-10-CM | POA: Diagnosis not present

## 2021-11-27 DIAGNOSIS — Z6836 Body mass index (BMI) 36.0-36.9, adult: Secondary | ICD-10-CM

## 2021-11-27 MED ORDER — METFORMIN HCL 500 MG PO TABS
500.0000 mg | ORAL_TABLET | Freq: Two times a day (BID) | ORAL | 0 refills | Status: DC
  Filled 2021-11-27: qty 60, 30d supply, fill #0

## 2021-11-27 MED ORDER — TOPIRAMATE 50 MG PO TABS
50.0000 mg | ORAL_TABLET | Freq: Every day | ORAL | 0 refills | Status: DC
  Filled 2021-11-27: qty 30, 30d supply, fill #0

## 2021-11-27 MED ORDER — BUPROPION HCL ER (SR) 150 MG PO TB12
150.0000 mg | ORAL_TABLET | Freq: Every day | ORAL | 0 refills | Status: DC
  Filled 2021-11-27: qty 30, 30d supply, fill #0

## 2021-11-28 ENCOUNTER — Encounter: Payer: Self-pay | Admitting: Oncology

## 2021-11-28 LAB — COMPREHENSIVE METABOLIC PANEL
ALT: 13 IU/L (ref 0–32)
AST: 16 IU/L (ref 0–40)
Albumin/Globulin Ratio: 1.8 (ref 1.2–2.2)
Albumin: 4.6 g/dL (ref 3.8–4.8)
Alkaline Phosphatase: 97 IU/L (ref 44–121)
BUN/Creatinine Ratio: 21 (ref 12–28)
BUN: 22 mg/dL (ref 8–27)
Bilirubin Total: 0.5 mg/dL (ref 0.0–1.2)
CO2: 23 mmol/L (ref 20–29)
Calcium: 9.4 mg/dL (ref 8.7–10.3)
Chloride: 106 mmol/L (ref 96–106)
Creatinine, Ser: 1.05 mg/dL — ABNORMAL HIGH (ref 0.57–1.00)
Globulin, Total: 2.5 g/dL (ref 1.5–4.5)
Glucose: 91 mg/dL (ref 70–99)
Potassium: 4 mmol/L (ref 3.5–5.2)
Sodium: 142 mmol/L (ref 134–144)
Total Protein: 7.1 g/dL (ref 6.0–8.5)
eGFR: 60 mL/min/{1.73_m2} (ref >59)

## 2021-11-28 LAB — VITAMIN D 25 HYDROXY (VIT D DEFICIENCY, FRACTURES): Vit D, 25-Hydroxy: 33.2 ng/mL (ref 30.0–100.0)

## 2021-11-28 LAB — HEMOGLOBIN A1C
Est. average glucose Bld gHb Est-mCnc: 100 mg/dL
Hgb A1c MFr Bld: 5.1 % (ref 4.8–5.6)

## 2021-11-28 LAB — LIPID PANEL WITH LDL/HDL RATIO
Cholesterol, Total: 195 mg/dL (ref 100–199)
HDL: 44 mg/dL (ref >39)
LDL Chol Calc (NIH): 127 mg/dL — ABNORMAL HIGH (ref 0–99)
LDL/HDL Ratio: 2.9 ratio (ref 0.0–3.2)
Triglycerides: 136 mg/dL (ref 0–149)
VLDL Cholesterol Cal: 24 mg/dL (ref 5–40)

## 2021-11-28 LAB — INSULIN, RANDOM: INSULIN: 11.7 u[IU]/mL (ref 2.6–24.9)

## 2021-12-07 NOTE — Progress Notes (Signed)
? ? ? ?Chief Complaint:  ? ?OBESITY ?Brelee is here to discuss her progress with her obesity treatment plan along with follow-up of her obesity related diagnoses. Ninfa is on the Category 1 Plan and states she is following her eating plan approximately 50% of the time. Jayla states she is walking and doing cardio 30 minutes 4-5 times per week. ? ?Today's visit was #: 12 ?Starting weight: 238 lbs ?Starting date: 03/21/2021 ?Today's weight: 214 lbs ?Today's date: 11/27/2021 ?Total lbs lost to date: 25 lbs ?Total lbs lost since last in-office visit: 2 lbs ? ?Interim History: Bethenny is down and additional two lbs since her last visit. She has been fasting.  ? ?Subjective:  ? ?1. Insulin resistance ?We will refill Metformin 500 mg twice daily for 1 month with no refills. Solace will continue to work on weight loss, exercise, and decreasing simple carbohydrates to help decrease the risk of diabetes. Jari agreed to follow-up with [place] as directed to closely monitor her progress.Goal is HgbA1c < 5.7, fasting insulin closer to 5.   ? ?Lab Results  ?Component Value Date  ? HGBA1C 5.1 11/27/2021  ? ?Lab Results  ?Component Value Date  ? INSULIN 11.7 11/27/2021  ? INSULIN 31.4 (H) 03/21/2021  ?Goal is HgbA1c < 5.7, fasting insulin closer to 5.   ? ?Lab Results  ?Component Value Date  ? HGBA1C 5.1 11/27/2021  ? ?Lab Results  ?Component Value Date  ? INSULIN 11.7 11/27/2021  ? INSULIN 31.4 (H) 03/21/2021  ? ? ?2. Vitamin D deficiency ?She is currently taking OTC Vitamin D daily. She denies nausea, vomiting or muscle weakness. ? ?Lab Results  ?Component Value Date  ? VD25OH 33.2 11/27/2021  ? VD25OH 14.4 (L) 03/21/2021  ? ? ?3. Other depression, emotional eating ?Annaleia reports taking as directed and some stress eating. We will refill Wellburtin SR 150 mg for 1 month with no refills Behavior modification techniques were discussed today to help Caddie deal with her emotional/non-hunger eating behaviors.  Orders and follow  up as documented in patient record.  ?  ?Assessment/Plan:  ? ?1. Insulin resistance ?Jadasia will continue to work on weight loss, exercise, and decreasing simple carbohydrates to help decrease the risk of diabetes. Avalynne agreed to follow-up with [place] as directed to closely monitor her progress. ?- metFORMIN (GLUCOPHAGE) 500 MG tablet; Take 1 tablet by mouth 2  times daily with a meal.  Dispense: 60 tablet; Refill: 0 ?- Lipid Panel With LDL/HDL Ratio ?- Insulin, random ?- Hemoglobin A1c ?- Comprehensive metabolic panel ? ?2. Vitamin D deficiency ?Low Vitamin D level contributes to fatigue and are associated with obesity, breast, and colon cancer. She agrees to continue to take Vitamin D OTC daily and will follow-up for routine testing of Vitamin D, at least 2-3 times per year to avoid over-replacement. ?- VITAMIN D 25 Hydroxy (Vit-D Deficiency, Fractures) ? ?3. Other depression, emotional eating ?We will refill Wellburtin SR 150 mg for 1 month with no refills and start Topomax 50 MG , 1 tablet daily. Behavior modification techniques were discussed today to help Yuliya deal with her emotional/non-hunger eating behaviors.  Orders and follow up as documented in patient record.  ?- buPROPion (WELLBUTRIN SR) 150 MG 12 hr tablet; Take 1 tablet by mouth daily.  Dispense: 30 tablet; Refill: 0 ?- topiramate (TOPAMAX) 50 MG tablet; Take 1 tablet by mouth daily with supper.  Dispense: 30 tablet; Refill: 0 ? ?4. Obesity, with current BMI of 36.8 ?Chabeli is currently in the  action stage of change. As such, her goal is to continue with weight loss efforts. She has agreed to the Category 1 Plan.  ? ?Exercise goals:Khalila will walk more and goal over a mile. ? ?Behavioral modification strategies: increasing lean protein intake, decreasing simple carbohydrates, increasing vegetables, increasing water intake, decreasing eating out, no skipping meals, meal planning and cooking strategies, keeping healthy foods in the home, and  planning for success. ?Graylyn was informed we would discuss her lab results at her next visit unless there is a critical issue that needs to be addressed sooner. Sheily agreed to keep her next visit at the agreed upon time to discuss these results. ? ?Dimond has agreed to follow-up with our clinic in 4-5  weeks. She was informed of the importance of frequent follow-up visits to maximize her success with intensive lifestyle modifications for her multiple health conditions.  ? ?Objective:  ? ?Pulse 76, temperature 98.1 ?F (36.7 ?C), height '5\' 4"'$  (1.626 m), weight 214 lb (97.1 kg), SpO2 97 %. ?Body mass index is 36.73 kg/m?. ? ?General: Cooperative, alert, well developed, in no acute distress. ?HEENT: Conjunctivae and lids unremarkable. ?Cardiovascular: Regular rhythm.  ?Lungs: Normal work of breathing. ?Neurologic: No focal deficits.  ? ?Lab Results  ?Component Value Date  ? CREATININE 1.05 (H) 11/27/2021  ? BUN 22 11/27/2021  ? NA 142 11/27/2021  ? K 4.0 11/27/2021  ? CL 106 11/27/2021  ? CO2 23 11/27/2021  ? ?Lab Results  ?Component Value Date  ? ALT 13 11/27/2021  ? AST 16 11/27/2021  ? ALKPHOS 97 11/27/2021  ? BILITOT 0.5 11/27/2021  ? ?Lab Results  ?Component Value Date  ? HGBA1C 5.1 11/27/2021  ? HGBA1C 5.5 03/21/2021  ? HGBA1C 5.4 04/27/2014  ? ?Lab Results  ?Component Value Date  ? INSULIN 11.7 11/27/2021  ? INSULIN 31.4 (H) 03/21/2021  ? ?Lab Results  ?Component Value Date  ? TSH 2.200 03/21/2021  ? ?Lab Results  ?Component Value Date  ? CHOL 195 11/27/2021  ? HDL 44 11/27/2021  ? LDLCALC 127 (H) 11/27/2021  ? TRIG 136 11/27/2021  ? CHOLHDL 3.2 02/18/2019  ? ?Lab Results  ?Component Value Date  ? VD25OH 33.2 11/27/2021  ? VD25OH 14.4 (L) 03/21/2021  ? ?Lab Results  ?Component Value Date  ? WBC 7.2 06/27/2021  ? HGB 13.0 06/27/2021  ? HCT 40.2 06/27/2021  ? MCV 87.2 06/27/2021  ? PLT 244 06/27/2021  ? ?No results found for: IRON, TIBC, FERRITIN ? ?Obesity Behavioral Intervention:  ? ?Approximately 15  minutes were spent on the discussion below. ? ?ASK: ?We discussed the diagnosis of obesity with Paulena today and Jenee agreed to give [place] permission to discuss obesity behavioral modification therapy today. ? ?ASSESS: ?Anquinette has the diagnosis of obesity and her BMI today is 36.8. Kimber is in the action stage of change.  ? ?ADVISE: ?Peola was educated on the multiple health risks of obesity as well as the benefit of weight loss to improve her health. She was advised of the need for long term treatment and the importance of lifestyle modifications to improve her current health and to decrease her risk of future health problems. ? ?AGREE: ?Multiple dietary modification options and treatment options were discussed and Cleo agreed to follow the recommendations documented in the above note. ? ?ARRANGE: ?Kalianne was educated on the importance of frequent visits to treat obesity as outlined per CMS and USPSTF guidelines and agreed to schedule her next follow up appointment today. ? ?Attestation  Statements:  ? ?Reviewed by clinician on day of visit: allergies, medications, problem list, medical history, surgical history, family history, social history, and previous encounter notes.  ? ?I, Lennette Bihari, CMA, am acting as transcriptionist for Dr. Jearld Lesch, DO.  ? ?I have reviewed the above documentation for accuracy and completeness, and I agree with the above. Jearld Lesch, DO ? ?

## 2021-12-09 ENCOUNTER — Encounter (INDEPENDENT_AMBULATORY_CARE_PROVIDER_SITE_OTHER): Payer: Self-pay | Admitting: Bariatrics

## 2021-12-14 ENCOUNTER — Other Ambulatory Visit (HOSPITAL_COMMUNITY): Payer: Self-pay

## 2021-12-14 NOTE — Progress Notes (Deleted)
[facility] MD/PA/NP OP Progress Note  12/14/2021 5:07 PM Jacqueline Good  MRN:  [id number]  Chief Complaint: No chief complaint on file.  HPI:  - metformin, bupropion were started by her provider  Visit Diagnosis: No diagnosis found.  Past Psychiatric History: Please see initial evaluation for full details. I have reviewed the history. No updates at this time.     Past Medical History:  Past Medical History:  Diagnosis Date   Anemia    Anxiety    Arthritis    "all over" (08/12/2018)   Asthma    Back pain    Breast cancer, right breast ([place]) dx'd 02/2018   Chronic bronchitis ([place])    Depression    Family history of breast cancer    Family history of prostate cancer    Family history of uterine cancer    GERD (gastroesophageal reflux disease)    History of blood transfusion    "several; related to low blood" (08/12/2018)   History of radiation therapy 11/15/18- 12/17/18   Right Chest wall, SCV, PAB. 25 fractions.    Lymphedema of right arm    Palpitations    Personal history of chemotherapy    Personal history of radiation therapy     Past Surgical History:  Procedure Laterality Date   AXILLARY LYMPH NODE DISSECTION Right 08/12/2018   Procedure: AXILLARY LYMPH NODE DISSECTION;  Surgeon: Alphonsa Overall, MD;  Location: [place];  Service: General;  Laterality: Right;   BREAST BIOPSY Right 03/2018   BREAST RECONSTRUCTION WITH PLACEMENT OF TISSUE EXPANDER AND FLEX HD (ACELLULAR HYDRATED DERMIS) Right 08/12/2018   Procedure: RIGHT BREAST RECONSTRUCTION WITH PLACEMENT OF TISSUE EXPANDER AND FLEX HD (ACELLULAR HYDRATED DERMIS);  Surgeon: Wallace Going, DO;  Location: [place];  Service: Plastics;  Laterality: Right;   BREAST REDUCTION SURGERY Left 09/28/2019   Procedure: LEFT MAMMARY REDUCTION/MASTOPEXY  (BREAST);  Surgeon: Wallace Going, DO;  Location: [facility];  Service: Plastics;  Laterality: Left;   DILATION AND CURETTAGE OF UTERUS     ENDOMETRIAL ABLATION     IR IMAGING  GUIDED PORT INSERTION  03/18/2018   MASTECTOMY MODIFIED RADICAL Right 08/12/2018   Procedure: RIGHT MODIFIED RADICAL MASTECTOMY;  Surgeon: Alphonsa Overall, MD;  Location: [place];  Service: General;  Laterality: Right;   MASTECTOMY MODIFIED RADICAL Right 08/12/2018   w/axillary LND   MYOMECTOMY     REDUCTION MAMMAPLASTY Left 2021   REMOVAL OF TISSUE EXPANDER AND PLACEMENT OF IMPLANT Right 10/26/2018   Procedure: Removal of right breast expander;  Surgeon: Wallace Going, DO;  Location: [place];  Service: Plastics;  Laterality: Right;  75 min, please    Family Psychiatric History: Please see initial evaluation for full details. I have reviewed the history. No updates at this time.     Family History:  Family History  Problem Relation Age of Onset   Lupus Sister    Heart disease Sister    Breast cancer Mother 54   Prostate cancer Paternal Uncle    Diabetes Maternal Grandmother    Heart Problems Maternal Grandmother    Prostate cancer Maternal Grandfather    Prostate cancer Paternal Grandfather    Prostate cancer Other        MGFs brother   Breast cancer Other        Mat great-grandmother's sister   Uterine cancer Maternal Great-grandmother    Prostate cancer Other        PGFs brother    Social History:  Social History   Socioeconomic History   Marital status: Single    Spouse name: Not on file   Number of children: Not on file   Years of education: Not on file   Highest education level: Not on file  Occupational History   Not on file  Tobacco Use   Smoking status: Former    Packs/day: 1.00    Years: 38.00    Pack years: 38.00    Types: Cigarettes    Quit date: 05/15/2018    Years since quitting: 3.5   Smokeless tobacco: Never  Vaping Use   Vaping Use: Never used  Substance and Sexual Activity   Alcohol use: Yes    Comment: occ   Drug use: No   Sexual activity: Not Currently    Birth control/protection: None  Other Topics Concern   Not on file  Social History  Narrative   Not on file   Social Determinants of Health   Financial Resource Strain: Not on file  Food Insecurity: Not on file  Transportation Needs: Not on file  Physical Activity: Not on file  Stress: Not on file  Social Connections: Not on file    Allergies:  Allergies  Allergen Reactions   Gadolinium Derivatives Itching and Cough    Pt began sneezing and coughing as soon as MRI contrast was injected. Severe nasal congestion. No rash or hives no SOB.    Hydrocodone Hives and Itching    Pt says she can take it with benadryl.    Iodinated Contrast Media Itching    Metabolic Disorder Labs: Lab Results  Component Value Date   HGBA1C 5.1 11/27/2021   No results found for: PROLACTIN Lab Results  Component Value Date   CHOL 195 11/27/2021   TRIG 136 11/27/2021   HDL 44 11/27/2021   CHOLHDL 3.2 02/18/2019   VLDL 23 02/18/2019   LDLCALC 127 (H) 11/27/2021   LDLCALC 93 03/21/2021   Lab Results  Component Value Date   TSH 2.200 03/21/2021   TSH 0.58 09/24/2016    Therapeutic Level Labs: No results found for: LITHIUM No results found for: VALPROATE No components found for:  CBMZ  Current Medications: Current Outpatient Medications  Medication Sig Dispense Refill   Albuterol Sulfate (PROAIR RESPICLICK) 299 (90 Base) MCG/ACT AEPB Inhale 1 puff every 4 hours as needed 1 each 0   ALPRAZolam (XANAX) 1 MG tablet TAKE 1 TABLET BY MOUTH 3 TIMES DAILY AND 1/2 TABLET AS NEEDED FOR ANXIETY (MAX 3.'5MG'$  DAILY) 105 tablet 2   buPROPion (WELLBUTRIN SR) 150 MG 12 hr tablet Take 1 tablet by mouth daily. 30 tablet 0   esomeprazole (NEXIUM) 40 MG capsule TAKE 1 CAPSULE (40 MG TOTAL) BY MOUTH DAILY. 30 capsule 2   loratadine (CLARITIN) 10 MG tablet Take 1 tablet (10 mg total) by mouth daily as needed for allergies. 30 tablet 11   lurasidone (LATUDA) 40 MG TABS tablet Take 1 tablet (40 mg total) by mouth daily with breakfast. Do not fill before 08/22/21. 90 tablet 1   metFORMIN  (GLUCOPHAGE) 500 MG tablet Take 1 tablet by mouth 2  times daily with a meal. 60 tablet 0   mirtazapine (REMERON) 30 MG tablet Take 1 tablet (30 mg total) by mouth at bedtime. 90 tablet 1   Multiple Vitamin (MULTIVITAMIN) capsule Take 1 capsule by mouth daily.     omeprazole (PRILOSEC) 40 MG capsule TAKE 1 CAPSULE BY MOUTH DAILY. 30 capsule 2   propranolol (INDERAL) 40 MG  tablet Take 40 mg by mouth daily as needed (heart palpitation.).      tiZANidine (ZANAFLEX) 4 MG tablet Take 1 tablet by mouth twice daily as needed. 30 tablet 0   topiramate (TOPAMAX) 50 MG tablet Take 1 tablet by mouth daily with supper. 30 tablet 0   venlafaxine XR (EFFEXOR-XR) 75 MG 24 hr capsule Take 3 capsules (225 mg total) by mouth daily with breakfast. 270 capsule 1   Vitamin D, Ergocalciferol, (DRISDOL) 1.25 MG (50000 UNIT) CAPS capsule Take 1 capsule by mouth every 7 days. 4 capsule 0   zolpidem (AMBIEN) 10 MG tablet Take 1 tablet (10 mg total) by mouth at bedtime as needed. 30 tablet 2   No current facility-administered medications for this visit.     Musculoskeletal: Strength & Muscle Tone:  N/A Gait & Station:  N/A Patient leans: N/A  Psychiatric Specialty Exam: Review of Systems  There were no vitals taken for this visit.There is no height or weight on file to calculate BMI.  General Appearance: {Appearance:22683}  Eye Contact:  {BHH EYE CONTACT:22684}  Speech:  Clear and Coherent  Volume:  Normal  Mood:  {BHH MOOD:22306}  Affect:  {Affect (PAA):22687}  Thought Process:  Coherent  Orientation:  Full (Time, Place, and Person)  Thought Content: Logical   Suicidal Thoughts:  {ST/HT (PAA):22692}  Homicidal Thoughts:  {ST/HT (PAA):22692}  Memory:  Immediate;   Good  Judgement:  {Judgement (PAA):22694}  Insight:  {Insight (PAA):22695}  Psychomotor Activity:  Normal  Concentration:  Concentration: Good and Attention Span: Good  Recall:  Good  Fund of Knowledge: Good  Language: Good  Akathisia:  No   Handed:  Right  AIMS (if indicated): not done  Assets:  Communication Skills Desire for Improvement  ADL's:  Intact  Cognition: WNL  Sleep:  {BHH GOOD/FAIR/POOR:22877}   Screenings: GAD-7    Flowsheet Row Office Visit from 04/22/2018 in [facility] Office Visit from 06/03/2017 in [facility] Office Visit from 09/24/2016 in [facility] Office Visit from 03/27/2016 in [facility]  Total GAD-7 Score '11 3 15 16      '$ PHQ2-9    Flowsheet Row Video Visit from 09/23/2021 in [facility] Video Visit from 04/09/2021 in [facility] Office Visit from 03/21/2021 in [facility] Video Visit from 11/07/2020 in [facility] Office Visit from 04/22/2018 in [facility]  PHQ-2 Total Score '4 6 1 4 5  '$ PHQ-9 Total Score '9 11 4 11 15      '$ Flowsheet Row Video Visit from 01/08/2021 in [facility] CATEGORY No Risk        Assessment and Plan:  OPHA MCGHEE is a 61 y.o. year old female with a history of depression, OCD, estrogen negative T2 pN1, stage IIB invasive ductal carcinoma, diagnosed 02/25/2018 s/p mastectomy 08/2018, neoadjuvant chemo immunotherapy/on trastzumab, asthma, OA, chronic back pain, who presents for follow up appointment for below.    1. MDD (major depressive disorder), recurrent episode, mild ([place]) 2. Generalized anxiety disorder She continues to report depressive symptoms and an anxiety since the last visit.  Psychosocial stressors includes her medical condition, unemployment, chronic pain, and taking care of her mother with medical condition.  Will continue venlafaxine, mirtazapine, Latuda to target depression.  Will continue Xanax as needed for anxiety. Noted she will greatly benefit  from CBT , she is  not interested in this option .    3. Insomnia, unspecified type She has good benefit from Ambien.  Will continue current dose to target insomnia.  She is not interested in evaluation of sleep apnea, although it was recommended due to her snoring in the past.    # inattention Unchanged. She has a history of worsening in concentration over the past few years before chemotherapy.  No history of IEP/ADHD.  The etiology is likely multifactorial given her ongoing mood symptoms.  She is not interested in a neuropsychological evaluation.    This clinician has discussed the side effect associated with medication prescribed during this encounter. Please refer to notes in the previous encounters for more details.     Plan Continue venlafaxine 225 mg daily Continue Latuda 40 mg, take at night - monitor weight gain Continue mirtazapine 15 mg at night  Continue Xanax 1 mg three times a day (and additional 0.5 mg daily) as needed for anxiety    Continue Ambien 10 mg at night as needed for insomnia Next appointment: 5/9  at 4 PM for 30 mins, video. She declined in person visit as she does not drive highway.    Past trials of medication: sertraline, fluoxetine, lexapro, Buspar, venlafaxine, duloxetine, bupropion, quetiapine, Abilify   The patient demonstrates the following risk factors for suicide: Chronic risk factors for suicide include: psychiatric disorder of depression and medical illness of breast cancer. Acute risk factors for suicide include: unemployment and loss (financial, interpersonal, professional). Protective factors for this patient include: positive social support, responsibility to others (children, family), coping skills and hope for the future. Considering these factors, the overall suicide risk at this point appears to be low. Patient is appropriate for outpatient follow up.  Collaboration of Care: Collaboration of Care: {BH OP Collaboration of Care:21014065}  Patient/Guardian was  advised Release of Information must be obtained prior to any record release in order to collaborate their care with an outside provider. Patient/Guardian was advised if they have not already done so to contact the registration department to sign all necessary forms in order for [place] to release information regarding their care.   Consent: Patient/Guardian gives verbal consent for treatment and assignment of benefits for services provided during this visit. Patient/Guardian expressed understanding and agreed to proceed.    Norman Clay, MD 12/14/2021, 5:07 PM

## 2021-12-17 ENCOUNTER — Telehealth: Payer: Self-pay | Admitting: Psychiatry

## 2021-12-17 ENCOUNTER — Telehealth: Payer: Medicare Other | Admitting: Psychiatry

## 2021-12-17 ENCOUNTER — Other Ambulatory Visit (HOSPITAL_COMMUNITY): Payer: Self-pay

## 2021-12-17 NOTE — Telephone Encounter (Signed)
Sent link for video visit through [facility]. Patient did not sign in. Called the patient for appointment scheduled today. Her sister answered the phone. She states that [place] reportedly called the office this morning to cancel the appointment. Her sister was advised to advise Tessla to schedule the appointment.  ?

## 2021-12-18 ENCOUNTER — Telehealth: Payer: Self-pay | Admitting: Psychiatry

## 2021-12-18 NOTE — Telephone Encounter (Signed)
Voicemail message left after 1200 on 12/17/2021 to cancel appointment with provider later in the afternoon. Encounter status will be entered as No Show/Late Cancel.  ?

## 2021-12-25 ENCOUNTER — Other Ambulatory Visit: Payer: Self-pay

## 2021-12-25 ENCOUNTER — Other Ambulatory Visit (HOSPITAL_COMMUNITY): Payer: Self-pay

## 2021-12-25 DIAGNOSIS — Z171 Estrogen receptor negative status [ER-]: Secondary | ICD-10-CM
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
## 2021-12-26 ENCOUNTER — Ambulatory Visit: Payer: Medicare Other | Admitting: Adult Health

## 2021-12-26 ENCOUNTER — Other Ambulatory Visit: Payer: Medicare Other

## 2021-12-26 ENCOUNTER — Other Ambulatory Visit: Payer: Self-pay

## 2021-12-26 ENCOUNTER — Inpatient Hospital Stay: Payer: Medicare Other | Attending: Adult Health

## 2021-12-26 ENCOUNTER — Inpatient Hospital Stay (HOSPITAL_BASED_OUTPATIENT_CLINIC_OR_DEPARTMENT_OTHER): Payer: Medicare Other | Admitting: Adult Health

## 2021-12-26 ENCOUNTER — Encounter: Payer: Self-pay | Admitting: Adult Health

## 2021-12-26 VITALS — BP 135/77 | HR 72 | Temp 98.1°F | Resp 18 | Ht 64.0 in | Wt 217.9 lb

## 2021-12-26 DIAGNOSIS — E559 Vitamin D deficiency, unspecified: Secondary | ICD-10-CM | POA: Insufficient documentation

## 2021-12-26 DIAGNOSIS — Z853 Personal history of malignant neoplasm of breast: Secondary | ICD-10-CM | POA: Diagnosis not present

## 2021-12-26 DIAGNOSIS — Z923 Personal history of irradiation: Secondary | ICD-10-CM | POA: Diagnosis not present

## 2021-12-26 DIAGNOSIS — Z171 Estrogen receptor negative status [ER-]: Secondary | ICD-10-CM | POA: Diagnosis not present

## 2021-12-26 DIAGNOSIS — C50411 Malignant neoplasm of upper-outer quadrant of right female breast: Secondary | ICD-10-CM

## 2021-12-26 DIAGNOSIS — Z87891 Personal history of nicotine dependence: Secondary | ICD-10-CM | POA: Insufficient documentation

## 2021-12-26 DIAGNOSIS — M79606 Pain in leg, unspecified: Secondary | ICD-10-CM | POA: Diagnosis not present

## 2021-12-26 LAB — CMP (CANCER CENTER ONLY)
ALT: 12 U/L (ref 0–44)
AST: 14 U/L — ABNORMAL LOW (ref 15–41)
Albumin: 4.3 g/dL (ref 3.5–5.0)
Alkaline Phosphatase: 78 U/L (ref 38–126)
Anion gap: 8 (ref 5–15)
BUN: 23 mg/dL (ref 8–23)
CO2: 28 mmol/L (ref 22–32)
Calcium: 9.5 mg/dL (ref 8.9–10.3)
Chloride: 105 mmol/L (ref 98–111)
Creatinine: 1.02 mg/dL — ABNORMAL HIGH (ref 0.44–1.00)
GFR, Estimated: >60 mL/min (ref >60)
Glucose, Bld: 94 mg/dL (ref 70–99)
Potassium: 3.8 mmol/L (ref 3.5–5.1)
Sodium: 141 mmol/L (ref 135–145)
Total Bilirubin: 0.7 mg/dL (ref 0.3–1.2)
Total Protein: 7.5 g/dL (ref 6.5–8.1)

## 2021-12-26 LAB — CBC WITH DIFFERENTIAL (CANCER CENTER ONLY)
Abs Immature Granulocytes: 0.01 10*3/uL (ref 0.00–0.07)
Basophils Absolute: 0 10*3/uL (ref 0.0–0.1)
Basophils Relative: 0 %
Eosinophils Absolute: 0.1 10*3/uL (ref 0.0–0.5)
Eosinophils Relative: 1 %
HCT: 39.6 % (ref 36.0–46.0)
Hemoglobin: 12.9 g/dL (ref 12.0–15.0)
Immature Granulocytes: 0 %
Lymphocytes Relative: 19 %
Lymphs Abs: 1.2 10*3/uL (ref 0.7–4.0)
MCH: 28.5 pg (ref 26.0–34.0)
MCHC: 32.6 g/dL (ref 30.0–36.0)
MCV: 87.4 fL (ref 80.0–100.0)
Monocytes Absolute: 0.3 10*3/uL (ref 0.1–1.0)
Monocytes Relative: 5 %
Neutro Abs: 4.8 10*3/uL (ref 1.7–7.7)
Neutrophils Relative %: 75 %
Platelet Count: 211 10*3/uL (ref 150–400)
RBC: 4.53 MIL/uL (ref 3.87–5.11)
RDW: 13.4 % (ref 11.5–15.5)
WBC Count: 6.4 10*3/uL (ref 4.0–10.5)
nRBC: 0 % (ref 0.0–0.2)

## 2021-12-26 NOTE — Progress Notes (Signed)
[facility] up:    Jacqueline Roles, PA [address] [place]  [address]   DIAGNOSIS:  Cancer Staging  Malignant neoplasm of upper-outer quadrant of right breast in female, estrogen receptor negative ([place]) Staging form: Breast, AJCC 8th Edition - Clinical stage from 03/03/2018: Stage IIA (cT57m, cN1, cM0, G2, ER-, PR-, HER2+) - Unsigned Histologic grading system: 3 grade system Laterality: Right Stage used in treatment planning: Yes National guidelines used in treatment planning: Yes Type of national guideline used in treatment planning: NCCN - Pathologic stage from 08/12/2018: No Stage Recommended (ypT0, pN0, cM0) - Unsigned Stage prefix: Post-therapy   SUMMARY OF ONCOLOGIC HISTORY: Oncology History  Malignant neoplasm of upper-outer quadrant of right breast in female, estrogen receptor negative ([facility]  02/25/2018 Initial Diagnosis   Routine screening mammography revealed right breast abnormality. Diagnostic mammogram and ultrasound revealed a breast mass with a second 0.6 cm lesion. 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 ER 0%, PR 0%, Ki67 50%. HER2 positive by FISH.    03/23/2018 - 03/31/2019 Chemotherapy   The patient had palonosetron (ALOXI) injection 0.25 mg, 0.25 mg, Intravenous,  Once, 6 of 6 cycles  Administration: 0.25 mg (03/23/2018), 0.25 mg (04/13/2018), 0.25 mg (05/04/2018), 0.25 mg (06/24/2018), 0.25 mg (07/12/2018), 0.25 mg (05/31/2018)    pegfilgrastim (NEULASTA) injection 6 mg, 6 mg, Subcutaneous, Once, 4 of 4 cycles  Administration: 6 mg (03/25/2018), 6 mg (04/15/2018), 6 mg (05/06/2018), 6 mg (07/14/2018)    trastuzumab (HERCEPTIN) 750 mg in sodium chloride 0.9 % 250 mL chemo infusion, 777 mg, Intravenous,  Once, 16 of 16 cycles  Administration: 750 mg (03/23/2018), 600 mg (04/13/2018), 600 mg (05/04/2018), 600 mg (06/24/2018), 600 mg (07/12/2018), 600 mg (08/06/2018), 588 mg  (09/22/2018), 600 mg (12/15/2018), 600 mg (01/28/2019), 600 mg (02/17/2019), 600 mg (05/31/2018), 588 mg (09/01/2018), 600 mg (01/05/2019), 588 mg (10/13/2018), 600 mg (03/10/2019), 600 mg (03/31/2019)    CARBOplatin (PARAPLATIN) 720 mg in sodium chloride 0.9 % 250 mL chemo infusion, 720 mg (100 % of original dose 721 mg), Intravenous,  Once, 6 of 6 cycles  Dose modification:   (original dose 721 mg, Cycle 1)  Administration: 720 mg (03/23/2018), 650 mg (04/13/2018), 670 mg (05/04/2018), 710 mg (06/24/2018), 620 mg (07/12/2018), 710 mg (05/31/2018)    DOCEtaxel (TAXOTERE) 160 mg in sodium chloride 0.9 % 250 mL chemo infusion, 75 mg/m2 = 160 mg, Intravenous,  Once, 3 of 3 cycles  Administration: 160 mg (03/23/2018), 160 mg (04/13/2018), 160 mg (05/04/2018)    gemcitabine (GEMZAR) 1,672 mg in sodium chloride 0.9 % 250 mL chemo infusion, 800 mg/m2 = 1,672 mg (100 % of original dose 800 mg/m2), Intravenous,  Once, 3 of 3 cycles  Dose modification: 800 mg/m2 (original dose 800 mg/m2, Cycle 4, Reason: Provider Judgment)  Administration: 1,672 mg (05/31/2018), 1,672 mg (06/24/2018), 1,672 mg (07/12/2018)    pertuzumab (PERJETA) 840 mg in sodium chloride 0.9 % 250 mL chemo infusion, 840 mg, Intravenous, Once, 1 of 1 cycle  Administration: 840 mg (03/23/2018)    fosaprepitant (EMEND) 150 mg, dexamethasone (DECADRON) 12 mg in sodium chloride 0.9 % 145 mL IVPB, , Intravenous,  Once, 6 of 6 cycles  Administration:  (03/23/2018),  (04/13/2018),  (05/04/2018),  (06/24/2018),  (07/12/2018),  (05/31/2018)  for chemotherapy treatment.      08/12/2018 Surgery   Right modified radical mastectomy. 0/11 lymph nodes positive for carcinoma. Stage ypT0 ypN0 M0.  09/29/2018 Genetic Testing   Negative genetic testing on the common hereditary cancer panel.  The Hereditary Gene Panel offered by [facility] includes sequencing and/or deletion duplication testing of the following 47 genes: APC, ATM, AXIN2, BARD1, BMPR1A, BRCA1, BRCA2,  BRIP1, CDH1, CDK4, CDKN2A (p14ARF), CDKN2A (p16INK4a), CHEK2, CTNNA1, DICER1, EPCAM (Deletion/duplication testing only), GREM1 (promoter region deletion/duplication testing only), KIT, MEN1, MLH1, MSH2, MSH3, MSH6, MUTYH, NBN, NF1, NHTL1, PALB2, PDGFRA, PMS2, POLD1, POLE, PTEN, RAD50, RAD51C, RAD51D, SDHB, SDHC, SDHD, SMAD4, SMARCA4. STK11, TP53, TSC1, TSC2, and VHL.  The following genes were evaluated for sequence changes only: SDHA and HOXB13 c.251G>A variant only. The report date is September 29, 2018.    11/15/2018 - 12/17/2018 Radiation Therapy   The patient initially received a dose of 50 Gy in 25 fractions to the breast using whole-breast tangent fields. This was delivered using a 3-D conformal technique. The patient also received a dose of 50 Gy in 25 fractions to the axilla. The total dose was 100 Gy.      CURRENT THERAPY: Observation  INTERVAL HISTORY: Jacqueline Good 61 y.o. female returns for follow-up of her history of breast cancer.  Jacqueline underwent screening left breast mammogram that was completed on July 11, 2021.This showed no evidence of malignancy and breast density category C.  Jacqueline is doing well.  Jacqueline has an upcoming appt with her PCP.  Jacqueline still has some issues with exercising due to her legs and back hurting.  Jacqueline tells me the leg pain began during chemotherapy. Otherwise, Jacqueline is feeling moderately well today.    Patient Active Problem List   Diagnosis Date Noted   Chronic superficial gastritis without bleeding 08/06/2021   Gastroduodenitis 08/06/2021   Dysphagia 08/06/2021   Stricture of esophagus 08/06/2021   Personal history of malignant neoplasm of breast 08/06/2021   Insulin resistance 04/26/2021   Vitamin D deficiency 03/26/2021   Postoperative breast asymmetry 09/13/2019   Genetic testing 10/01/2018   Family history of breast cancer    Family history of uterine cancer    Family history of prostate cancer    S/P mastectomy, right 08/20/2018   Acquired absence  of right breast 08/20/2018   MDD (major depressive disorder), recurrent episode, moderate ([place]) 06/13/2018   Generalized anxiety disorder 06/13/2018   OCD (obsessive compulsive disorder) 06/13/2018   Insomnia 06/13/2018   Malignant neoplasm of upper-outer quadrant of right breast in female, estrogen receptor negative ([place]) 03/02/2018   Chronic bilateral low back pain without sciatica 06/03/2017   Arthritis 04/27/2014   Fatigue 06/10/2013   Class 3 severe obesity with serious comorbidity and body mass index (BMI) of 40.0 to 44.9 in adult ([place]) 06/10/2013   Asthma, chronic obstructive, with acute exacerbation ([place]) 06/10/2013   Allergy 06/10/2013    is allergic to gadolinium derivatives, hydrocodone, and iodinated contrast media.  MEDICAL HISTORY: Past Medical History:  Diagnosis Date   Anemia    Anxiety    Arthritis    "all over" (08/12/2018)   Asthma    Back pain    Benign paroxysmal positional vertigo 10/31/2014   Breast cancer, right breast ([place]) dx'd 02/2018   Chronic bronchitis ([place])    Depression    Family history of breast cancer    Family history of prostate cancer    Family history of uterine cancer    GERD (gastroesophageal reflux disease)    History of blood transfusion    "several; related to low blood" (08/12/2018)   History of radiation therapy 11/15/18- 12/17/18  Right Chest wall, SCV, PAB. 25 fractions.    Lymphedema of right arm    Palpitations    Personal history of chemotherapy    Personal history of radiation therapy     SURGICAL HISTORY: Past Surgical History:  Procedure Laterality Date   AXILLARY LYMPH NODE DISSECTION Right 08/12/2018   Procedure: AXILLARY LYMPH NODE DISSECTION;  Surgeon: Alphonsa Overall, MD;  Location: [facility];  Service: General;  Laterality: Right;   BREAST BIOPSY Right 03/2018   BREAST RECONSTRUCTION WITH PLACEMENT OF TISSUE EXPANDER AND FLEX HD (ACELLULAR HYDRATED DERMIS) Right 08/12/2018   Procedure: RIGHT BREAST RECONSTRUCTION WITH  PLACEMENT OF TISSUE EXPANDER AND FLEX HD (ACELLULAR HYDRATED DERMIS);  Surgeon: Wallace Going, DO;  Location: [facility];  Service: Plastics;  Laterality: Right;   BREAST REDUCTION SURGERY Left 09/28/2019   Procedure: LEFT MAMMARY REDUCTION/MASTOPEXY  (BREAST);  Surgeon: Wallace Going, DO;  Location: [facility];  Service: Plastics;  Laterality: Left;   DILATION AND CURETTAGE OF UTERUS     ENDOMETRIAL ABLATION     IR IMAGING GUIDED PORT INSERTION  03/18/2018   MASTECTOMY MODIFIED RADICAL Right 08/12/2018   Procedure: RIGHT MODIFIED RADICAL MASTECTOMY;  Surgeon: Alphonsa Overall, MD;  Location: [facility];  Service: General;  Laterality: Right;   MASTECTOMY MODIFIED RADICAL Right 08/12/2018   w/axillary LND   MYOMECTOMY     REDUCTION MAMMAPLASTY Left 2021   REMOVAL OF TISSUE EXPANDER AND PLACEMENT OF IMPLANT Right 10/26/2018   Procedure: Removal of right breast expander;  Surgeon: Wallace Going, DO;  Location: [facility];  Service: Plastics;  Laterality: Right;  75 min, please    SOCIAL HISTORY: Social History   Socioeconomic History   Marital status: Single    Spouse name: Not on file   Number of children: Not on file   Years of education: Not on file   Highest education level: Not on file  Occupational History   Not on file  Tobacco Use   Smoking status: Former    Packs/day: 1.00    Years: 38.00    Pack years: 38.00    Types: Cigarettes    Quit date: 05/15/2018    Years since quitting: 3.6   Smokeless tobacco: Never  Vaping Use   Vaping Use: Never used  Substance and Sexual Activity   Alcohol use: Yes    Comment: occ   Drug use: No   Sexual activity: Not Currently    Birth control/protection: None  Other Topics Concern   Not on file  Social History Narrative   Not on file   Social Determinants of Health   Financial Resource Strain: Not on file  Food Insecurity: Not on file  Transportation Needs: Not on file  Physical Activity: Not on file  Stress: Not  on file  Social Connections: Not on file  Intimate Partner Violence: Not on file    FAMILY HISTORY: Family History  Problem Relation Age of Onset   Lupus Sister    Heart disease Sister    Breast cancer Mother 33   Prostate cancer Paternal Uncle    Diabetes Maternal Grandmother    Heart Problems Maternal Grandmother    Prostate cancer Maternal Grandfather    Prostate cancer Paternal Grandfather    Prostate cancer Other        MGFs brother   Breast cancer Other        Mat great-grandmother's sister   Uterine cancer Maternal Great-grandmother    Prostate cancer Other  PGFs brother    Review of Systems  Constitutional:  Negative for appetite change, chills, fatigue, fever and unexpected weight change.  HENT:   Negative for hearing loss, lump/mass and trouble swallowing.   Eyes:  Negative for eye problems and icterus.  Respiratory:  Negative for chest tightness, cough and shortness of breath.   Cardiovascular:  Negative for chest pain, leg swelling and palpitations.  Gastrointestinal:  Negative for abdominal distention, abdominal pain, constipation, diarrhea, nausea and vomiting.  Endocrine: Negative for hot flashes.  Genitourinary:  Negative for difficulty urinating.   Musculoskeletal:  Negative for arthralgias.  Skin:  Negative for itching and rash.  Neurological:  Negative for dizziness, extremity weakness, headaches and numbness.  Hematological:  Negative for adenopathy. Does not bruise/bleed easily.  Psychiatric/Behavioral:  Negative for depression. The patient is not nervous/anxious.      PHYSICAL EXAMINATION  ECOG PERFORMANCE STATUS: 1 - Symptomatic but completely ambulatory  Vitals:   12/26/21 1203  BP: 135/77  Pulse: 72  Resp: 18  Temp: 98.1 F (36.7 C)  SpO2: 100%    Physical Exam Constitutional:      General: Jacqueline is not in acute distress.    Appearance: Normal appearance. Jacqueline is not toxic-appearing.  HENT:     Head: Normocephalic and atraumatic.   Eyes:     General: No scleral icterus. Cardiovascular:     Rate and Rhythm: Normal rate and regular rhythm.     Pulses: Normal pulses.     Heart sounds: Normal heart sounds.  Pulmonary:     Effort: Pulmonary effort is normal.     Breath sounds: Normal breath sounds.  Abdominal:     General: Abdomen is flat. Bowel sounds are normal. There is no distension.     Palpations: Abdomen is soft.     Tenderness: There is no abdominal tenderness.  Musculoskeletal:        General: No swelling.     Cervical back: Neck supple.  Lymphadenopathy:     Cervical: No cervical adenopathy.  Skin:    General: Skin is warm and dry.     Findings: No rash.  Neurological:     General: No focal deficit present.     Mental Status: Jacqueline is alert.  Psychiatric:        Mood and Affect: Mood normal.        Behavior: Behavior normal.    LABORATORY DATA:  CBC    Component Value Date/Time   WBC 6.4 12/26/2021 1114   WBC 6.7 08/15/2020 1441   RBC 4.53 12/26/2021 1114   HGB 12.9 12/26/2021 1114   HCT 39.6 12/26/2021 1114   PLT 211 12/26/2021 1114   MCV 87.4 12/26/2021 1114   MCH 28.5 12/26/2021 1114   MCHC 32.6 12/26/2021 1114   RDW 13.4 12/26/2021 1114   LYMPHSABS 1.2 12/26/2021 1114   MONOABS 0.3 12/26/2021 1114   EOSABS 0.1 12/26/2021 1114   BASOSABS 0.0 12/26/2021 1114    CMP     Component Value Date/Time   NA 141 12/26/2021 1114   NA 142 11/27/2021 0759   K 3.8 12/26/2021 1114   CL 105 12/26/2021 1114   CO2 28 12/26/2021 1114   GLUCOSE 94 12/26/2021 1114   BUN 23 12/26/2021 1114   BUN 22 11/27/2021 0759   CREATININE 1.02 (H) 12/26/2021 1114   CREATININE 0.83 09/24/2016 1151   CALCIUM 9.5 12/26/2021 1114   PROT 7.5 12/26/2021 1114   PROT 7.1 11/27/2021 0759   ALBUMIN 4.3  12/26/2021 1114   ALBUMIN 4.6 11/27/2021 0759   AST 14 (L) 12/26/2021 1114   ALT 12 12/26/2021 1114   ALKPHOS 78 12/26/2021 1114   BILITOT 0.7 12/26/2021 1114   GFRNONAA >60 12/26/2021 1114   GFRNONAA 79  09/24/2016 1151   GFRAA >60 02/09/2020 1409   GFRAA >89 09/24/2016 1151       ASSESSMENT and THERAPY PLAN:   Malignant neoplasm of upper-outer quadrant of right breast in female, estrogen receptor negative ([place]) Jacqueline Good is here today for follow-up of her history of HER2 positive breast cancer.  Jacqueline has no clinical or radiographic sign of breast cancer recurrence.  Jacqueline will continue to undergo annual mammograms, with her next mammogram due in December 2023.    I recommended continuing annual follow-up at minimum with her primary care provider and that Jacqueline stay up-to-date with other cancer screenings including gynecologic, colon cancer, and skin cancer screening.    We discussed healthy diet and exercise, and I reviewed with her the correlation between risk reduction and exercise between 150 to 300 minutes of moderate to vigorous activity a week.In regards to her leg pain we discussed the opportunity of water exercise and swimming.  Jacqueline will return in 1 year for continued follow-up and observation.   All questions were answered. The patient knows to call the clinic with any problems, questions or concerns. We can certainly see the patient much sooner if necessary.  Total encounter time: 20 minutes*in face-to-face visit time, chart review, lab review, care coordination, order entry, and documentation of the encounter time.    Wilber Bihari, NP 12/29/21 10:17 PM Medical Oncology and Hematology [facility] [place], [place] [address] Tel. [phone]    Fax. [phone]  *Total Encounter Time as defined by the Centers for Medicare and Medicaid Services includes, in addition to the face-to-face time of a patient visit (documented in the note above) non-face-to-face time: obtaining and reviewing outside history, ordering and reviewing medications, tests or procedures, care coordination (communications with other health care professionals or caregivers) and  documentation in the medical record.

## 2021-12-27 ENCOUNTER — Other Ambulatory Visit: Payer: Self-pay | Admitting: Psychiatry

## 2021-12-27 ENCOUNTER — Other Ambulatory Visit (HOSPITAL_COMMUNITY): Payer: Self-pay

## 2021-12-27 ENCOUNTER — Telehealth: Payer: Self-pay

## 2021-12-27 MED ORDER — CARIPRAZINE HCL 1.5 MG PO CAPS
1.5000 mg | ORAL_CAPSULE | Freq: Every day | ORAL | 0 refills | Status: DC
  Filled 2021-12-27: qty 30, 30d supply, fill #0

## 2021-12-27 NOTE — Telephone Encounter (Signed)
pt states she needs to speak with you about taking something else.  she states that some of her medicaiton you ordered for her she can not afford anymore. so she needs to speak with you.

## 2021-12-27 NOTE — Telephone Encounter (Signed)
Discussed with the patient.  She states that she wants some medication instead of Latuda as she has not been able to afford this medication. She has not taken this for the past few months, and she is having "episode." She denies SI. She agrees with the following - Start Vraylar 1.5 mg daily.  Discussed potential metabolic side effect, EPS - She agrees to discuss at her next visit if she is unable to afford this medication either.

## 2021-12-29 NOTE — Assessment & Plan Note (Signed)
She Jacqueline Good is here today for follow-up of her history of HER2 positive breast cancer.  She has no clinical or radiographic sign of breast cancer recurrence.  She will continue to undergo annual mammograms, with her next mammogram due in December 2023.    I recommended continuing annual follow-up at minimum with her primary care provider and that she stay up-to-date with other cancer screenings including gynecologic, colon cancer, and skin cancer screening.    We discussed healthy diet and exercise, and I reviewed with her the correlation between risk reduction and exercise between 150 to 300 minutes of moderate to vigorous activity a week.In regards to her leg pain we discussed the opportunity of water exercise and swimming.  She will return in 1 year for continued follow-up and observation.

## 2021-12-30 ENCOUNTER — Telehealth: Payer: Self-pay

## 2021-12-30 ENCOUNTER — Other Ambulatory Visit (HOSPITAL_COMMUNITY): Payer: Self-pay

## 2021-12-30 NOTE — Telephone Encounter (Signed)
received a request for a prior auth for the vraylar.

## 2021-12-30 NOTE — Telephone Encounter (Signed)
PA was approved for vraylar 1.'5mg'$  approved until 08-10-22  PA -[id number]

## 2021-12-30 NOTE — Telephone Encounter (Signed)
went online and submitted the prior auth for vraylar - pending

## 2022-01-01 ENCOUNTER — Encounter: Payer: Self-pay | Admitting: Psychiatry

## 2022-01-01 ENCOUNTER — Ambulatory Visit (INDEPENDENT_AMBULATORY_CARE_PROVIDER_SITE_OTHER): Payer: Medicare Other | Admitting: Bariatrics

## 2022-01-01 ENCOUNTER — Telehealth (INDEPENDENT_AMBULATORY_CARE_PROVIDER_SITE_OTHER): Payer: Medicare Other | Admitting: Psychiatry

## 2022-01-01 ENCOUNTER — Other Ambulatory Visit (HOSPITAL_COMMUNITY): Payer: Self-pay

## 2022-01-01 DIAGNOSIS — F411 Generalized anxiety disorder: Secondary | ICD-10-CM | POA: Diagnosis not present

## 2022-01-01 DIAGNOSIS — G4700 Insomnia, unspecified: Secondary | ICD-10-CM

## 2022-01-01 DIAGNOSIS — F331 Major depressive disorder, recurrent, moderate: Secondary | ICD-10-CM | POA: Diagnosis not present

## 2022-01-01 MED ORDER — ALPRAZOLAM 1 MG PO TABS
ORAL_TABLET | ORAL | 0 refills | Status: DC
  Filled 2022-01-01 – 2022-02-12 (×4): qty 105, 30d supply, fill #0

## 2022-01-01 MED ORDER — ZOLPIDEM TARTRATE 10 MG PO TABS
10.0000 mg | ORAL_TABLET | Freq: Every evening | ORAL | 2 refills | Status: DC | PRN
  Filled 2022-01-01 – 2022-02-07 (×2): qty 30, 30d supply, fill #0
  Filled 2022-03-10: qty 30, 30d supply, fill #1
  Filled 2022-04-16: qty 30, 30d supply, fill #2

## 2022-01-01 MED ORDER — LURASIDONE HCL 40 MG PO TABS
40.0000 mg | ORAL_TABLET | Freq: Every day | ORAL | 0 refills | Status: DC
  Filled 2022-01-01: qty 90, 90d supply, fill #0

## 2022-01-01 NOTE — Patient Instructions (Signed)
Continue venlafaxine 225 mg daily Discontinue Vraylar Start latuda 40 mg daily  Continue mirtazapine 15 mg at night  Continue Xanax 1 mg three times a day (and additional 0.5 mg daily) as needed for anxiety    Continue Ambien 10 mg at night as needed for insomnia Next appointment: 6/30 at 9:30

## 2022-01-02 ENCOUNTER — Other Ambulatory Visit (HOSPITAL_COMMUNITY): Payer: Self-pay

## 2022-01-08 ENCOUNTER — Other Ambulatory Visit (HOSPITAL_COMMUNITY): Payer: Self-pay

## 2022-01-08 ENCOUNTER — Encounter (INDEPENDENT_AMBULATORY_CARE_PROVIDER_SITE_OTHER): Payer: Self-pay | Admitting: Bariatrics

## 2022-01-08 ENCOUNTER — Ambulatory Visit (INDEPENDENT_AMBULATORY_CARE_PROVIDER_SITE_OTHER): Payer: Medicare Other | Admitting: Bariatrics

## 2022-01-08 VITALS — BP 131/83 | HR 67 | Temp 98.1°F | Ht 64.0 in | Wt 209.0 lb

## 2022-01-08 DIAGNOSIS — E8881 Metabolic syndrome: Secondary | ICD-10-CM

## 2022-01-08 DIAGNOSIS — E669 Obesity, unspecified: Secondary | ICD-10-CM

## 2022-01-08 DIAGNOSIS — E559 Vitamin D deficiency, unspecified: Secondary | ICD-10-CM | POA: Diagnosis not present

## 2022-01-08 DIAGNOSIS — Z6835 Body mass index (BMI) 35.0-35.9, adult: Secondary | ICD-10-CM

## 2022-01-08 DIAGNOSIS — F3289 Other specified depressive episodes: Secondary | ICD-10-CM | POA: Diagnosis not present

## 2022-01-08 DIAGNOSIS — F32A Depression, unspecified: Secondary | ICD-10-CM | POA: Insufficient documentation

## 2022-01-08 MED ORDER — BUPROPION HCL ER (SR) 150 MG PO TB12
150.0000 mg | ORAL_TABLET | Freq: Every day | ORAL | 0 refills | Status: DC
  Filled 2022-01-08: qty 30, 30d supply, fill #0

## 2022-01-08 MED ORDER — METFORMIN HCL 500 MG PO TABS
500.0000 mg | ORAL_TABLET | Freq: Two times a day (BID) | ORAL | 0 refills | Status: DC
  Filled 2022-01-08: qty 60, 30d supply, fill #0

## 2022-01-08 MED ORDER — VITAMIN D (ERGOCALCIFEROL) 1.25 MG (50000 UNIT) PO CAPS
50000.0000 [IU] | ORAL_CAPSULE | ORAL | 0 refills | Status: DC
  Filled 2022-01-08: qty 4, 28d supply, fill #0

## 2022-01-09 ENCOUNTER — Other Ambulatory Visit (HOSPITAL_COMMUNITY): Payer: Self-pay

## 2022-01-10 NOTE — Progress Notes (Signed)
Chief Complaint:   OBESITY Jacqueline Good is here to discuss her progress with her obesity treatment plan along with follow-up of her obesity related diagnoses. Jacqueline Good is on the Category 1 Plan and states she is following her eating plan approximately 50% of the time. Jacqueline Good states she is doing 0 minutes 0 times per week.  Today's visit was #: 44 Starting weight: 238 lbs Starting date: 03/21/2021 Today's weight: 209 lbs Today's date: 01/08/2022 Total lbs lost to date: 29 lbs Total lbs lost since last in-office visit: 5 lbs  Interim History: Jacqueline Good is down another 5 lbs and doing well overall. She is doing okay with her water and protein intake.   Subjective:   1. Insulin resistance Jacqueline Good is taking Metformin currently. Her insulin decreased from 39.4 to 11.7.  2. Vitamin D deficiency Jacqueline Good is taking Vitamin D as directed. She get minimal sun exposure.   3. Other depression, emotional eating Jacqueline Good is currently taking Wellbutrin.   Assessment/Plan:   1. Insulin resistance Jacqueline Good will continue to work on weight loss, exercise, and decreasing simple carbohydrates to help decrease the risk of diabetes. We will refill Metformin 500 mg for 1 month with no refills. Jacqueline Good agreed to follow-up with Jacqueline Good as directed to closely monitor her progress.  - metFORMIN (GLUCOPHAGE) 500 MG tablet; Take 1 tablet by mouth 2  times daily with a meal.  Dispense: 60 tablet; Refill: 0  2. Vitamin D deficiency Low Vitamin D level contributes to fatigue and are associated with obesity, breast, and colon cancer. We will refill prescription Vitamin D 50,000 IU every week for 1 month with no refills and Jacqueline Good will follow-up for routine testing of Vitamin D, at least 2-3 times per year to avoid over-replacement.  - Vitamin D, Ergocalciferol, (DRISDOL) 1.25 MG (50000 UNIT) CAPS capsule; Take 1 capsule by mouth every 7 days.  Dispense: 4 capsule; Refill: 0  3. Other depression, emotional  eating Behavior modification techniques were discussed today to help Jacqueline Good deal with her emotional/non-hunger eating behaviors.  We will refill Wellbutrin SR 150 mg for 1 month with no refills. Orders and follow up as documented in patient record.   - buPROPion (WELLBUTRIN SR) 150 MG 12 hr tablet; Take 1 tablet by mouth daily.  Dispense: 30 tablet; Refill: 0  4. Obesity, Current BMI 35.9 Jacqueline Good is currently in the action stage of change. As such, her goal is to continue with weight loss efforts. She has agreed to the Category 1 Plan.   I reviewed labs from 12/26/2021 CMP, CBC, and glucose with [facility].   Exercise goals:  Jacqueline Good has back and leg pain. She is doing less exercise.   Behavioral modification strategies: increasing lean protein intake, decreasing simple carbohydrates, increasing vegetables, increasing water intake, decreasing eating out, no skipping meals, meal planning and cooking strategies, keeping healthy foods in the home, and planning for success.  Jacqueline Good has agreed to follow-up with our clinic in 3-4 weeks. She was informed of the importance of frequent follow-up visits to maximize her success with intensive lifestyle modifications for her multiple health conditions.   Objective:   Blood pressure 131/83, pulse 67, temperature 98.1 F (36.7 C), height '5\' 4"'$  (1.626 m), weight 209 lb (94.8 kg), SpO2 97 %. Body mass index is 35.87 kg/m.  General: Cooperative, alert, well developed, in no acute distress. HEENT: Conjunctivae and lids unremarkable. Cardiovascular: Regular rhythm.  Lungs: Normal work of breathing. Neurologic: No focal deficits.   Lab Results  Component Value  Date   CREATININE 1.02 (H) 12/26/2021   BUN 23 12/26/2021   NA 141 12/26/2021   K 3.8 12/26/2021   CL 105 12/26/2021   CO2 28 12/26/2021   Lab Results  Component Value Date   ALT 12 12/26/2021   AST 14 (L) 12/26/2021   ALKPHOS 78 12/26/2021   BILITOT 0.7 12/26/2021   Lab Results   Component Value Date   HGBA1C 5.1 11/27/2021   HGBA1C 5.5 03/21/2021   HGBA1C 5.4 04/27/2014   Lab Results  Component Value Date   INSULIN 11.7 11/27/2021   INSULIN 31.4 (H) 03/21/2021   Lab Results  Component Value Date   TSH 2.200 03/21/2021   Lab Results  Component Value Date   CHOL 195 11/27/2021   HDL 44 11/27/2021   LDLCALC 127 (H) 11/27/2021   TRIG 136 11/27/2021   CHOLHDL 3.2 02/18/2019   Lab Results  Component Value Date   VD25OH 33.2 11/27/2021   VD25OH 14.4 (L) 03/21/2021   Lab Results  Component Value Date   WBC 6.4 12/26/2021   HGB 12.9 12/26/2021   HCT 39.6 12/26/2021   MCV 87.4 12/26/2021   PLT 211 12/26/2021   No results found for: IRON, TIBC, FERRITIN  Attestation Statements:   Reviewed by clinician on day of visit: allergies, medications, problem list, medical history, surgical history, family history, social history, and previous encounter notes.  I, Lizbeth Bark, RMA, am acting as Location manager for [employer], DO.  I have reviewed the above documentation for accuracy and completeness, and I agree with the above. Jearld Lesch, DO

## 2022-01-13 ENCOUNTER — Other Ambulatory Visit (HOSPITAL_COMMUNITY): Payer: Self-pay

## 2022-01-14 ENCOUNTER — Other Ambulatory Visit (HOSPITAL_COMMUNITY): Payer: Self-pay

## 2022-01-15 ENCOUNTER — Encounter (INDEPENDENT_AMBULATORY_CARE_PROVIDER_SITE_OTHER): Payer: Self-pay | Admitting: Bariatrics

## 2022-01-18 ENCOUNTER — Other Ambulatory Visit (HOSPITAL_COMMUNITY): Payer: Self-pay

## 2022-01-22 ENCOUNTER — Other Ambulatory Visit (HOSPITAL_COMMUNITY): Payer: Self-pay

## 2022-01-23 ENCOUNTER — Other Ambulatory Visit: Payer: Self-pay | Admitting: Psychiatry

## 2022-01-23 ENCOUNTER — Other Ambulatory Visit (HOSPITAL_COMMUNITY): Payer: Self-pay

## 2022-01-23 MED ORDER — LATUDA 40 MG PO TABS
40.0000 mg | ORAL_TABLET | Freq: Every day | ORAL | 0 refills | Status: DC
  Filled 2022-01-23: qty 90, 90d supply, fill #0
  Filled 2022-04-05: qty 30, 30d supply, fill #0

## 2022-01-24 ENCOUNTER — Other Ambulatory Visit (HOSPITAL_COMMUNITY): Payer: Self-pay

## 2022-02-03 NOTE — Progress Notes (Signed)
Virtual Visit via Video Note  I connected with Jacqueline Good on 02/07/22 at  9:30 AM EDT by a video enabled telemedicine application and verified that I am speaking with the correct person using two identifiers.  Location: Patient: home Provider: office Persons participated in the visit- patient, provider    I discussed the limitations of evaluation and management by telemedicine and the availability of in person appointments. The patient expressed understanding and agreed to proceed.  I discussed the assessment and treatment plan with the patient. The patient was provided an opportunity to ask questions and all were answered. The patient agreed with the plan and demonstrated an understanding of the instructions.   The patient was advised to call back or seek an in-person evaluation if the symptoms worsen or if the condition fails to improve as anticipated.  I provided 24 minutes of non-face-to-face time during this encounter. (Last Few minutes were spent on phone call due to interruption in the connection)   Jacqueline Clay, MD    [facility] MD/PA/NP OP Progress Note  02/07/2022 10:03 AM Jacqueline Good  MRN:  [id number]  Chief Complaint:  Chief Complaint  Patient presents with   Follow-up   Depression   HPI:  This is a follow-up appointment for depression and anxiety.  She states that she is in a bad spot.  She is bitter and depressed.  Although she visits her mother regularly, there are other family members at her house.  She needs to deal with them every day.  She states that she has good days and bad days.  She also states that her mother also has good days and bad days.  She is unable to walk due to her pain.  She talks about her brother, who lives in a camper in her driveway.  She states that he uses drug.  She thinks this is not good for her mental health.  Although he wants him to go, she knows that he does not have anywhere to go.  She wishes to help him.  He is not interested in  any treatment or NA meeting.  She sleeps well.  She feels good about weight loss since being mindful of her diet.  She denies SI.  She feels anxious and irritable.  She denies panic attacks.  She states that she wants to come off from venlafaxine, although she agrees to stay at this time.  She denies any side effect from the medication.    Daily routine: she denies having routine, doing housed hold chores, visiting her mother  Employment: used to work as Research scientist (physical sciences), last in 2017; although the patient was terminated, it was a good timing for the patient as she was taking care of her cousin, who later moved into nursing home.  Support: Household: by herself. Her mother lives next door Marital status: single  Number of children:1 daughter, who lives in [place]. She has estranged relationship with the father of her daughter.  She grew up in [place].  Her parents got divorced when she was 64-year-old.  Although she did not like them arguing with each other, she reports good relationship with her mother, who raised the patient and other siblings.       Visit Diagnosis:    ICD-10-CM   1. MDD (major depressive disorder), recurrent episode, moderate (HCC)  F33.1     2. Generalized anxiety disorder  F41.1     3. Insomnia, unspecified type  G47.00       Past  Psychiatric History: Please see initial evaluation for full details. I have reviewed the history. No updates at this time.     Past Medical History:  Past Medical History:  Diagnosis Date   Anemia    Anxiety    Arthritis    "all over" (08/12/2018)   Asthma    Back pain    Benign paroxysmal positional vertigo 10/31/2014   Breast cancer, right breast ([place]) dx'd 02/2018   Chronic bronchitis ([place])    Depression    Family history of breast cancer    Family history of prostate cancer    Family history of uterine cancer    GERD (gastroesophageal reflux disease)    History of blood transfusion    "several; related to low blood" (08/12/2018)    History of radiation therapy 11/15/18- 12/17/18   Right Chest wall, SCV, PAB. 25 fractions.    Lymphedema of right arm    Palpitations    Personal history of chemotherapy    Personal history of radiation therapy     Past Surgical History:  Procedure Laterality Date   AXILLARY LYMPH NODE DISSECTION Right 08/12/2018   Procedure: AXILLARY LYMPH NODE DISSECTION;  Surgeon: Alphonsa Overall, MD;  Location: [place];  Service: General;  Laterality: Right;   BREAST BIOPSY Right 03/2018   BREAST RECONSTRUCTION WITH PLACEMENT OF TISSUE EXPANDER AND FLEX HD (ACELLULAR HYDRATED DERMIS) Right 08/12/2018   Procedure: RIGHT BREAST RECONSTRUCTION WITH PLACEMENT OF TISSUE EXPANDER AND FLEX HD (ACELLULAR HYDRATED DERMIS);  Surgeon: Wallace Going, DO;  Location: [place];  Service: Plastics;  Laterality: Right;   BREAST REDUCTION SURGERY Left 09/28/2019   Procedure: LEFT MAMMARY REDUCTION/MASTOPEXY  (BREAST);  Surgeon: Wallace Going, DO;  Location: [place];  Service: Plastics;  Laterality: Left;   DILATION AND CURETTAGE OF UTERUS     ENDOMETRIAL ABLATION     IR IMAGING GUIDED PORT INSERTION  03/18/2018   MASTECTOMY MODIFIED RADICAL Right 08/12/2018   Procedure: RIGHT MODIFIED RADICAL MASTECTOMY;  Surgeon: Alphonsa Overall, MD;  Location: [place];  Service: General;  Laterality: Right;   MASTECTOMY MODIFIED RADICAL Right 08/12/2018   w/axillary LND   MYOMECTOMY     REDUCTION MAMMAPLASTY Left 2021   REMOVAL OF TISSUE EXPANDER AND PLACEMENT OF IMPLANT Right 10/26/2018   Procedure: Removal of right breast expander;  Surgeon: Wallace Going, DO;  Location: [place];  Service: Plastics;  Laterality: Right;  75 min, please    Family Psychiatric History: Please see initial evaluation for full details. I have reviewed the history. No updates at this time.     Family History:  Family History  Problem Relation Age of Onset   Lupus Sister    Heart disease Sister    Breast cancer Mother 41   Prostate  cancer Paternal Uncle    Diabetes Maternal Grandmother    Heart Problems Maternal Grandmother    Prostate cancer Maternal Grandfather    Prostate cancer Paternal Grandfather    Prostate cancer Other        MGFs brother   Breast cancer Other        Mat great-grandmother's sister   Uterine cancer Maternal Great-grandmother    Prostate cancer Other        PGFs brother    Social History:  Social History   Socioeconomic History   Marital status: Single    Spouse name: Not on file   Number of children: Not on file   Years of education: Not on  file   Highest education level: Not on file  Occupational History   Not on file  Tobacco Use   Smoking status: Former    Packs/day: 1.00    Years: 38.00    Total pack years: 38.00    Types: Cigarettes    Quit date: 05/15/2018    Years since quitting: 3.7   Smokeless tobacco: Never  Vaping Use   Vaping Use: Never used  Substance and Sexual Activity   Alcohol use: Yes    Comment: occ   Drug use: No   Sexual activity: Not Currently    Birth control/protection: None  Other Topics Concern   Not on file  Social History Narrative   Not on file   Social Determinants of Health   Financial Resource Strain: Not on file  Food Insecurity: Not on file  Transportation Needs: No Transportation Needs (09/07/2018)   PRAPARE - Transportation    Lack of Transportation (Medical): No    Lack of Transportation (Non-Medical): No  Physical Activity: Not on file  Stress: Not on file  Social Connections: Not on file    Allergies:  Allergies  Allergen Reactions   Gadolinium Derivatives Itching and Cough    Pt began sneezing and coughing as soon as MRI contrast was injected. Severe nasal congestion. No rash or hives no SOB.    Hydrocodone Hives and Itching    Pt says she can take it with benadryl.    Iodinated Contrast Media Itching    Metabolic Disorder Labs: Lab Results  Component Value Date   HGBA1C 5.1 11/27/2021   No results found for:  "PROLACTIN" Lab Results  Component Value Date   CHOL 195 11/27/2021   TRIG 136 11/27/2021   HDL 44 11/27/2021   CHOLHDL 3.2 02/18/2019   VLDL 23 02/18/2019   LDLCALC 127 (H) 11/27/2021   LDLCALC 93 03/21/2021   Lab Results  Component Value Date   TSH 2.200 03/21/2021   TSH 0.58 09/24/2016    Therapeutic Level Labs: No results found for: "LITHIUM" No results found for: "VALPROATE" No results found for: "CBMZ"  Current Medications: Current Outpatient Medications  Medication Sig Dispense Refill   Albuterol Sulfate (PROAIR RESPICLICK) 419 (90 Base) MCG/ACT AEPB Inhale 1 puff every 4 hours as needed 1 each 0   ALPRAZolam (XANAX) 1 MG tablet TAKE 1 TABLET BY MOUTH 3 TIMES DAILY AND 1/2 TABLET AS NEEDED FOR ANXIETY (MAX 3 and 1/2 TABLETS DAILY) 105 tablet 0   buPROPion (WELLBUTRIN SR) 150 MG 12 hr tablet Take 1 tablet by mouth daily. 30 tablet 0   esomeprazole (NEXIUM) 40 MG capsule TAKE 1 CAPSULE (40 MG TOTAL) BY MOUTH DAILY. 30 capsule 2   HYDROcodone-acetaminophen (NORCO/VICODIN) 5-325 MG tablet Take 1 tablet by mouth twice a day as needed 10 tablet 0   LATUDA 40 MG TABS tablet Take 1 tablet (40 mg total) by mouth daily with breakfast. 90 tablet 0   loratadine (CLARITIN) 10 MG tablet Take 1 tablet (10 mg total) by mouth daily as needed for allergies. 30 tablet 11   metFORMIN (GLUCOPHAGE) 500 MG tablet Take 1 tablet by mouth 2  times daily with a meal. 60 tablet 0   mirtazapine (REMERON) 30 MG tablet Take 1 tablet (30 mg total) by mouth at bedtime. 90 tablet 1   Multiple Vitamin (MULTIVITAMIN) capsule Take 1 capsule by mouth daily.     omeprazole (PRILOSEC) 40 MG capsule TAKE 1 CAPSULE BY MOUTH DAILY. 30 capsule 2  propranolol (INDERAL) 40 MG tablet Take 40 mg by mouth daily as needed (heart palpitation.).      tiZANidine (ZANAFLEX) 4 MG tablet Take 1 tablet by mouth twice daily as needed. 30 tablet 0   topiramate (TOPAMAX) 50 MG tablet Take 1 tablet by mouth daily with supper. 30  tablet 0   venlafaxine XR (EFFEXOR-XR) 75 MG 24 hr capsule Take 3 capsules (225 mg total) by mouth daily with breakfast. 270 capsule 1   Vitamin D, Ergocalciferol, (DRISDOL) 1.25 MG (50000 UNIT) CAPS capsule Take 1 capsule by mouth every 7 days. 4 capsule 0   zolpidem (AMBIEN) 10 MG tablet Take 1 tablet by mouth at bedtime as needed. 30 tablet 2   No current facility-administered medications for this visit.     Musculoskeletal: Strength & Muscle Tone:  N/A Gait & Station:  N/A Patient leans: N/A  Psychiatric Specialty Exam: Review of Systems  Psychiatric/Behavioral:  Positive for decreased concentration, dysphoric mood and sleep disturbance. Negative for agitation, behavioral problems, confusion, hallucinations, self-injury and suicidal ideas. The patient is nervous/anxious. The patient is not hyperactive.   All other systems reviewed and are negative.   There were no vitals taken for this visit.There is no height or weight on file to calculate BMI.  General Appearance: Fairly Groomed  Eye Contact:  Good  Speech:  Clear and Coherent  Volume:  Normal  Mood:  Depressed  Affect:  Appropriate, Congruent, and Restricted  Thought Process:  Coherent  Orientation:  Full (Time, Place, and Person)  Thought Content: Logical   Suicidal Thoughts:  No  Homicidal Thoughts:  No  Memory:  Immediate;   Good  Judgement:  Good  Insight:  Good  Psychomotor Activity:  Normal  Concentration:  Concentration: Good and Attention Span: Good  Recall:  Good  Fund of Knowledge: Good  Language: Good  Akathisia:  No  Handed:  Right  AIMS (if indicated): not done  Assets:  Communication Skills Desire for Improvement  ADL's:  Intact  Cognition: WNL  Sleep:  Poor   Screenings: GAD-7    Flowsheet Row Office Visit from 04/22/2018 in [place] Office Visit from 06/03/2017 in [place] Office Visit from 09/24/2016 in [place] Office Visit from 03/27/2016 in [place]  Total GAD-7 Score '11 3 15 16      '$ PHQ2-9    Flowsheet Row Video Visit from 09/23/2021 in [place] Video Visit from 04/09/2021 in [place] Office Visit from 03/21/2021 in [facility] Video Visit from 11/07/2020 in [place] Office Visit from 04/22/2018 in [place]  PHQ-2 Total Score '4 6 1 4 5  '$ PHQ-9 Total Score '9 11 4 11 15      '$ Flowsheet Row Video Visit from 01/08/2021 in [place] No Risk        Assessment and Plan:  VELETA YAMAMOTO is a 61 y.o. year old female with a history of depression, OCD, estrogen negative T2 pN1, stage IIB invasive ductal carcinoma, diagnosed 02/25/2018 s/p mastectomy 08/2018, neoadjuvant chemo immunotherapy/on trastzumab, asthma, OA, chronic back pain, who presents for follow up appointment for below.   1. MDD (major depressive disorder), recurrent episode, moderate ([place]) 2. Generalized anxiety disorder She continues to report irritability, depressive symptoms since the last visit.  Psychosocial stressors includes conflict  with her siblings, and her brother living in camper, who abuses drug, unemployment, chronic pain, mother's medical condition.  Although she reports preference to taper off venlafaxine, she agrees to stay on the medication especially given she does not have any side effect, and to avoid worsening in her mood symptoms.  Will continue Latuda to target depression.  Will continue mirtazapine to target depression and anxiety.  Will continue Xanax as needed for anxiety. Noted that although she will greatly benefit from CBT, she is not interested in this. Coached behavioral activation.   3. Insomnia, unspecified type Improving.  Will continue current dose of Ambien to  target insomnia. She is not interested in evaluation of sleep apnea, although it was recommended due to her snoring in the past.    # inattention Unchanged. She has a history of worsening in concentration over the past few years before chemotherapy.  No history of IEP/ADHD.  The etiology is likely multifactorial given her ongoing mood symptoms.  She is not interested in a neuropsychological evaluation.      Plan Continue venlafaxine 225 mg daily Continue latuda 40 mg daily  Continue mirtazapine 15 mg at night  Continue Xanax 1 mg three times a day (and additional 0.5 mg daily) as needed for anxiety    Continue Ambien 10 mg at night as needed for insomnia Next appointment: 9/27 at 9:30 for 30 mins, video. She declined in person visit as she does not drive highway.    Past trials of medication: sertraline, fluoxetine, lexapro, Buspar, venlafaxine, duloxetine, bupropion, quetiapine, Abilify   The patient demonstrates the following risk factors for suicide: Chronic risk factors for suicide include: psychiatric disorder of depression and medical illness of breast cancer. Acute risk factors for suicide include: unemployment and loss (financial, interpersonal, professional). Protective factors for this patient include: positive social support, responsibility to others (children, family), coping skills and hope for the future. Considering these factors, the overall suicide risk at this point appears to be low. Patient is appropriate for outpatient follow up.   This clinician has discussed the side effect associated with medication prescribed during this encounter. Please refer to notes in the previous encounters for more details.      Collaboration of Care: Collaboration of Care: Other N/A  Patient/Guardian was advised Release of Information must be obtained prior to any record release in order to collaborate their care with an outside provider. Patient/Guardian was advised if they have not already  done so to contact the registration department to sign all necessary forms in order for [place] to release information regarding their care.   Consent: Patient/Guardian gives verbal consent for treatment and assignment of benefits for services provided during this visit. Patient/Guardian expressed understanding and agreed to proceed.    Jacqueline Clay, MD 02/07/2022, 10:03 AM

## 2022-02-04 DIAGNOSIS — M9905 Segmental and somatic dysfunction of pelvic region: Secondary | ICD-10-CM | POA: Diagnosis not present

## 2022-02-04 DIAGNOSIS — M9903 Segmental and somatic dysfunction of lumbar region: Secondary | ICD-10-CM | POA: Diagnosis not present

## 2022-02-04 DIAGNOSIS — M9904 Segmental and somatic dysfunction of sacral region: Secondary | ICD-10-CM | POA: Diagnosis not present

## 2022-02-04 DIAGNOSIS — M5386 Other specified dorsopathies, lumbar region: Secondary | ICD-10-CM | POA: Diagnosis not present

## 2022-02-05 ENCOUNTER — Ambulatory Visit (INDEPENDENT_AMBULATORY_CARE_PROVIDER_SITE_OTHER): Payer: Medicare Other | Admitting: Bariatrics

## 2022-02-06 ENCOUNTER — Other Ambulatory Visit (HOSPITAL_COMMUNITY): Payer: Self-pay

## 2022-02-06 DIAGNOSIS — M545 Low back pain, unspecified: Secondary | ICD-10-CM | POA: Diagnosis not present

## 2022-02-06 DIAGNOSIS — M5416 Radiculopathy, lumbar region: Secondary | ICD-10-CM | POA: Diagnosis not present

## 2022-02-06 MED ORDER — HYDROCODONE-ACETAMINOPHEN 5-325 MG PO TABS
ORAL_TABLET | ORAL | 0 refills | Status: DC
  Filled 2022-02-06: qty 10, 5d supply, fill #0

## 2022-02-07 ENCOUNTER — Telehealth (INDEPENDENT_AMBULATORY_CARE_PROVIDER_SITE_OTHER): Payer: Medicare Other | Admitting: Psychiatry

## 2022-02-07 ENCOUNTER — Encounter: Payer: Self-pay | Admitting: Psychiatry

## 2022-02-07 ENCOUNTER — Other Ambulatory Visit (HOSPITAL_COMMUNITY): Payer: Self-pay

## 2022-02-07 DIAGNOSIS — G4700 Insomnia, unspecified: Secondary | ICD-10-CM | POA: Diagnosis not present

## 2022-02-07 DIAGNOSIS — F411 Generalized anxiety disorder: Secondary | ICD-10-CM | POA: Diagnosis not present

## 2022-02-07 DIAGNOSIS — F331 Major depressive disorder, recurrent, moderate: Secondary | ICD-10-CM

## 2022-02-07 NOTE — Patient Instructions (Signed)
Continue venlafaxine 225 mg daily Continue latuda 40 mg daily  Continue mirtazapine 15 mg at night  Continue Xanax 1 mg three times a day (and additional 0.5 mg daily) as needed for anxiety    Continue Ambien 10 mg at night as needed for insomnia Next appointment: 9/27 at 9:30

## 2022-02-10 DIAGNOSIS — M9905 Segmental and somatic dysfunction of pelvic region: Secondary | ICD-10-CM | POA: Diagnosis not present

## 2022-02-10 DIAGNOSIS — M9904 Segmental and somatic dysfunction of sacral region: Secondary | ICD-10-CM | POA: Diagnosis not present

## 2022-02-10 DIAGNOSIS — M9903 Segmental and somatic dysfunction of lumbar region: Secondary | ICD-10-CM | POA: Diagnosis not present

## 2022-02-10 DIAGNOSIS — M5386 Other specified dorsopathies, lumbar region: Secondary | ICD-10-CM | POA: Diagnosis not present

## 2022-02-12 ENCOUNTER — Other Ambulatory Visit (INDEPENDENT_AMBULATORY_CARE_PROVIDER_SITE_OTHER): Payer: Self-pay | Admitting: Bariatrics

## 2022-02-12 ENCOUNTER — Other Ambulatory Visit (HOSPITAL_COMMUNITY): Payer: Self-pay

## 2022-02-12 ENCOUNTER — Other Ambulatory Visit: Payer: Self-pay | Admitting: Psychiatry

## 2022-02-12 DIAGNOSIS — F3289 Other specified depressive episodes: Secondary | ICD-10-CM

## 2022-02-12 DIAGNOSIS — F330 Major depressive disorder, recurrent, mild: Secondary | ICD-10-CM

## 2022-02-12 DIAGNOSIS — F411 Generalized anxiety disorder: Secondary | ICD-10-CM

## 2022-02-12 DIAGNOSIS — E88819 Insulin resistance, unspecified: Secondary | ICD-10-CM

## 2022-02-12 DIAGNOSIS — E8881 Metabolic syndrome: Secondary | ICD-10-CM

## 2022-02-12 MED ORDER — PROAIR RESPICLICK 108 (90 BASE) MCG/ACT IN AEPB
INHALATION_SPRAY | RESPIRATORY_TRACT | 0 refills | Status: DC
  Filled 2022-02-12: qty 1, 16d supply, fill #0

## 2022-02-14 ENCOUNTER — Other Ambulatory Visit (HOSPITAL_COMMUNITY): Payer: Self-pay

## 2022-02-17 ENCOUNTER — Ambulatory Visit (INDEPENDENT_AMBULATORY_CARE_PROVIDER_SITE_OTHER): Payer: Medicare Other | Admitting: Bariatrics

## 2022-02-17 ENCOUNTER — Encounter (INDEPENDENT_AMBULATORY_CARE_PROVIDER_SITE_OTHER): Payer: Self-pay | Admitting: Bariatrics

## 2022-02-17 ENCOUNTER — Other Ambulatory Visit (HOSPITAL_COMMUNITY): Payer: Self-pay

## 2022-02-17 VITALS — BP 104/75 | HR 66 | Temp 98.2°F | Ht 64.0 in | Wt 211.0 lb

## 2022-02-17 DIAGNOSIS — E669 Obesity, unspecified: Secondary | ICD-10-CM

## 2022-02-17 DIAGNOSIS — E559 Vitamin D deficiency, unspecified: Secondary | ICD-10-CM | POA: Diagnosis not present

## 2022-02-17 DIAGNOSIS — F3289 Other specified depressive episodes: Secondary | ICD-10-CM | POA: Diagnosis not present

## 2022-02-17 DIAGNOSIS — E8881 Metabolic syndrome: Secondary | ICD-10-CM

## 2022-02-17 DIAGNOSIS — Z6836 Body mass index (BMI) 36.0-36.9, adult: Secondary | ICD-10-CM

## 2022-02-17 MED ORDER — BUPROPION HCL ER (SR) 150 MG PO TB12
150.0000 mg | ORAL_TABLET | Freq: Every day | ORAL | 0 refills | Status: DC
  Filled 2022-02-17: qty 30, 30d supply, fill #0

## 2022-02-17 MED ORDER — METFORMIN HCL 500 MG PO TABS
500.0000 mg | ORAL_TABLET | Freq: Two times a day (BID) | ORAL | 0 refills | Status: DC
  Filled 2022-02-17: qty 60, 30d supply, fill #0

## 2022-02-17 MED ORDER — VITAMIN D (ERGOCALCIFEROL) 1.25 MG (50000 UNIT) PO CAPS
50000.0000 [IU] | ORAL_CAPSULE | ORAL | 0 refills | Status: DC
  Filled 2022-02-17: qty 4, 28d supply, fill #0

## 2022-02-17 NOTE — Progress Notes (Unsigned)
Chief Complaint:   OBESITY Jacqueline Good is here to discuss her progress with her obesity treatment plan along with follow-up of her obesity related diagnoses. [name] is on the Category 1 Plan and states she is following her eating plan approximately 50% of the time. [name] states she is doing 0 minutes 0 times per week.  Today's visit was #: 14 Starting weight: 238 lbs Starting date: 03/21/2021 Today's weight: 211 lbs Today's date: 02/17/2022 Total lbs lost to date: 27 Total lbs lost since last in-office visit: 0  Interim History: [name] is up 2 pounds since her last visit, but she has done well overall.  Our bioimpedance scale shows that her body weight is up about 2 pounds.  Subjective:   1. Insulin resistance Jacqueline Good is taking metformin currently.  2. Vitamin D deficiency Jacqueline Good is taking vitamin D prescription, and she notes she is sleeping good.  3. Other depression, emotional eating Jacqueline Good notes that she has been slightly stressed eating.  Assessment/Plan:   1. Insulin resistance Jacqueline Good will continue metformin 500 mg twice daily with meals, and we will refill for 1 month.  - metFORMIN (GLUCOPHAGE) 500 MG tablet; Take 1 tablet by mouth 2  times daily with a meal.  Dispense: 60 tablet; Refill: 0  2. Vitamin D deficiency Jacqueline Good will continue prescription vitamin D 50,000 units once weekly, and we will refill for 1 month.  - Vitamin D, Ergocalciferol, (DRISDOL) 1.25 MG (50000 UNIT) CAPS capsule; Take 1 capsule by mouth every 7 days.  Dispense: 4 capsule; Refill: 0  3. Other depression, emotional eating Jacqueline Good will continue Wellbutrin SR 150 mg once daily, and we will refill for 1 month.  - buPROPion (WELLBUTRIN SR) 150 MG 12 hr tablet; Take 1 tablet by mouth daily.  Dispense: 30 tablet; Refill: 0  4. Obesity, Current BMI 36.4 Jacqueline Good is currently in the action stage of change. As such, her goal is to continue with weight loss efforts. She has agreed to the  Category 2 Plan.   Meal planning was discussed.  She will adhere more closely to the plan 80-90%.  Exercise goals: No exercise has been prescribed at this time.  Behavioral modification strategies: increasing lean protein intake, decreasing simple carbohydrates, increasing vegetables, increasing water intake, decreasing eating out, no skipping meals, meal planning and cooking strategies, keeping healthy foods in the home, and planning for success.  Jacqueline Good has agreed to follow-up with our clinic in 4 weeks. She was informed of the importance of frequent follow-up visits to maximize her success with intensive lifestyle modifications for her multiple health conditions.   Objective:   Blood pressure 104/75, pulse 66, temperature 98.2 F (36.8 C), height '5\' 4"'$  (1.626 m), weight 211 lb (95.7 kg), SpO2 97 %. Body mass index is 36.22 kg/m.  General: Cooperative, alert, well developed, in no acute distress. HEENT: Conjunctivae and lids unremarkable. Cardiovascular: Regular rhythm.  Lungs: Normal work of breathing. Neurologic: No focal deficits.   Lab Results  Component Value Date   CREATININE 1.02 (H) 12/26/2021   BUN 23 12/26/2021   NA 141 12/26/2021   K 3.8 12/26/2021   CL 105 12/26/2021   CO2 28 12/26/2021   Lab Results  Component Value Date   ALT 12 12/26/2021   AST 14 (L) 12/26/2021   ALKPHOS 78 12/26/2021   BILITOT 0.7 12/26/2021   Lab Results  Component Value Date   HGBA1C 5.1 11/27/2021   HGBA1C 5.5 03/21/2021   HGBA1C 5.4 04/27/2014  Lab Results  Component Value Date   INSULIN 11.7 11/27/2021   INSULIN 31.4 (H) 03/21/2021   Lab Results  Component Value Date   TSH 2.200 03/21/2021   Lab Results  Component Value Date   CHOL 195 11/27/2021   HDL 44 11/27/2021   LDLCALC 127 (H) 11/27/2021   TRIG 136 11/27/2021   CHOLHDL 3.2 02/18/2019   Lab Results  Component Value Date   VD25OH 33.2 11/27/2021   VD25OH 14.4 (L) 03/21/2021   Lab Results  Component  Value Date   WBC 6.4 12/26/2021   HGB 12.9 12/26/2021   HCT 39.6 12/26/2021   MCV 87.4 12/26/2021   PLT 211 12/26/2021   No results found for: "IRON", "TIBC", "FERRITIN"  Attestation Statements:   Reviewed by clinician on day of visit: allergies, medications, problem list, medical history, surgical history, family history, social history, and previous encounter notes.   Wilhemena Durie, am acting as Location manager for [employer], DO.  I have reviewed the above documentation for accuracy and completeness, and I agree with the above. Jearld Lesch, DO

## 2022-02-18 ENCOUNTER — Encounter (INDEPENDENT_AMBULATORY_CARE_PROVIDER_SITE_OTHER): Payer: Self-pay | Admitting: Bariatrics

## 2022-02-18 DIAGNOSIS — M5386 Other specified dorsopathies, lumbar region: Secondary | ICD-10-CM | POA: Diagnosis not present

## 2022-02-18 DIAGNOSIS — M9903 Segmental and somatic dysfunction of lumbar region: Secondary | ICD-10-CM | POA: Diagnosis not present

## 2022-02-18 DIAGNOSIS — M9904 Segmental and somatic dysfunction of sacral region: Secondary | ICD-10-CM | POA: Diagnosis not present

## 2022-02-18 DIAGNOSIS — M9905 Segmental and somatic dysfunction of pelvic region: Secondary | ICD-10-CM | POA: Diagnosis not present

## 2022-02-25 DIAGNOSIS — M9903 Segmental and somatic dysfunction of lumbar region: Secondary | ICD-10-CM | POA: Diagnosis not present

## 2022-02-25 DIAGNOSIS — M9905 Segmental and somatic dysfunction of pelvic region: Secondary | ICD-10-CM | POA: Diagnosis not present

## 2022-02-25 DIAGNOSIS — M5386 Other specified dorsopathies, lumbar region: Secondary | ICD-10-CM | POA: Diagnosis not present

## 2022-02-25 DIAGNOSIS — M9904 Segmental and somatic dysfunction of sacral region: Secondary | ICD-10-CM | POA: Diagnosis not present

## 2022-02-27 ENCOUNTER — Other Ambulatory Visit (HOSPITAL_COMMUNITY): Payer: Self-pay

## 2022-02-27 DIAGNOSIS — M5386 Other specified dorsopathies, lumbar region: Secondary | ICD-10-CM | POA: Diagnosis not present

## 2022-02-27 DIAGNOSIS — M9903 Segmental and somatic dysfunction of lumbar region: Secondary | ICD-10-CM | POA: Diagnosis not present

## 2022-02-27 DIAGNOSIS — M9905 Segmental and somatic dysfunction of pelvic region: Secondary | ICD-10-CM | POA: Diagnosis not present

## 2022-02-27 DIAGNOSIS — M9904 Segmental and somatic dysfunction of sacral region: Secondary | ICD-10-CM | POA: Diagnosis not present

## 2022-02-27 MED ORDER — HYDROCODONE-ACETAMINOPHEN 5-325 MG PO TABS
ORAL_TABLET | ORAL | 0 refills | Status: AC
  Filled 2022-02-27: qty 10, 5d supply, fill #0

## 2022-02-28 DIAGNOSIS — M5416 Radiculopathy, lumbar region: Secondary | ICD-10-CM | POA: Diagnosis not present

## 2022-03-03 ENCOUNTER — Other Ambulatory Visit (HOSPITAL_COMMUNITY): Payer: Self-pay

## 2022-03-04 DIAGNOSIS — M5386 Other specified dorsopathies, lumbar region: Secondary | ICD-10-CM | POA: Diagnosis not present

## 2022-03-04 DIAGNOSIS — M9905 Segmental and somatic dysfunction of pelvic region: Secondary | ICD-10-CM | POA: Diagnosis not present

## 2022-03-04 DIAGNOSIS — M9903 Segmental and somatic dysfunction of lumbar region: Secondary | ICD-10-CM | POA: Diagnosis not present

## 2022-03-04 DIAGNOSIS — M9904 Segmental and somatic dysfunction of sacral region: Secondary | ICD-10-CM | POA: Diagnosis not present

## 2022-03-05 ENCOUNTER — Other Ambulatory Visit (HOSPITAL_COMMUNITY): Payer: Self-pay

## 2022-03-10 ENCOUNTER — Other Ambulatory Visit (HOSPITAL_COMMUNITY): Payer: Self-pay

## 2022-03-10 ENCOUNTER — Other Ambulatory Visit: Payer: Self-pay | Admitting: Psychiatry

## 2022-03-10 MED ORDER — ALPRAZOLAM 1 MG PO TABS
ORAL_TABLET | ORAL | 1 refills | Status: DC
  Filled 2022-03-15: qty 105, 30d supply, fill #0
  Filled 2022-04-16: qty 105, 30d supply, fill #1
  Filled ????-??-??: fill #0

## 2022-03-12 ENCOUNTER — Other Ambulatory Visit (HOSPITAL_COMMUNITY): Payer: Self-pay

## 2022-03-15 ENCOUNTER — Other Ambulatory Visit (HOSPITAL_COMMUNITY): Payer: Self-pay

## 2022-03-18 ENCOUNTER — Ambulatory Visit (INDEPENDENT_AMBULATORY_CARE_PROVIDER_SITE_OTHER): Payer: Medicare Other | Admitting: Bariatrics

## 2022-03-18 ENCOUNTER — Ambulatory Visit (INDEPENDENT_AMBULATORY_CARE_PROVIDER_SITE_OTHER): Payer: Medicaid Other | Admitting: Bariatrics

## 2022-03-19 ENCOUNTER — Encounter (INDEPENDENT_AMBULATORY_CARE_PROVIDER_SITE_OTHER): Payer: Self-pay | Admitting: Bariatrics

## 2022-03-19 ENCOUNTER — Ambulatory Visit (INDEPENDENT_AMBULATORY_CARE_PROVIDER_SITE_OTHER): Payer: Medicare Other | Admitting: Bariatrics

## 2022-03-19 ENCOUNTER — Other Ambulatory Visit (HOSPITAL_COMMUNITY): Payer: Self-pay

## 2022-03-19 ENCOUNTER — Encounter (INDEPENDENT_AMBULATORY_CARE_PROVIDER_SITE_OTHER): Payer: Self-pay

## 2022-03-19 VITALS — BP 116/79 | HR 66 | Temp 98.1°F | Ht 64.0 in | Wt 217.0 lb

## 2022-03-19 DIAGNOSIS — E8881 Metabolic syndrome: Secondary | ICD-10-CM | POA: Diagnosis not present

## 2022-03-19 DIAGNOSIS — Z7984 Long term (current) use of oral hypoglycemic drugs: Secondary | ICD-10-CM | POA: Diagnosis not present

## 2022-03-19 DIAGNOSIS — F3289 Other specified depressive episodes: Secondary | ICD-10-CM | POA: Diagnosis not present

## 2022-03-19 DIAGNOSIS — E559 Vitamin D deficiency, unspecified: Secondary | ICD-10-CM

## 2022-03-19 DIAGNOSIS — E669 Obesity, unspecified: Secondary | ICD-10-CM | POA: Diagnosis not present

## 2022-03-19 DIAGNOSIS — Z6837 Body mass index (BMI) 37.0-37.9, adult: Secondary | ICD-10-CM

## 2022-03-19 MED ORDER — METFORMIN HCL 500 MG PO TABS
500.0000 mg | ORAL_TABLET | Freq: Two times a day (BID) | ORAL | 0 refills | Status: DC
  Filled 2022-03-19: qty 60, 30d supply, fill #0

## 2022-03-19 MED ORDER — VITAMIN D (ERGOCALCIFEROL) 1.25 MG (50000 UNIT) PO CAPS
50000.0000 [IU] | ORAL_CAPSULE | ORAL | 0 refills | Status: DC
  Filled 2022-03-19: qty 4, 28d supply, fill #0

## 2022-03-25 NOTE — Progress Notes (Unsigned)
Chief Complaint:   OBESITY Jacqueline Good is here to discuss her progress with her obesity treatment plan along with follow-up of her obesity related diagnoses. Jacqueline Good is on the Category 2 Plan and states she is following her eating plan approximately 50% of the time. Jacqueline Good states she is not currently exercising.  Today's visit was #: 15 Starting weight: 238 lbs Starting date: 03/21/2021 Today's weight: 217 lbs Today's date: 03/19/22 Total lbs lost to date: 21  Total lbs lost since last in-office visit: +6  Interim History: She is up 6 pounds since her last visit.  Per the bioimpedance, she is up about 2 pounds of muscle.  She has cooked for her brother.  Subjective:   1. Vitamin D deficiency Taking as ordered.  2. Insulin resistance Taking metformin.  3. Other depression, emotional eating Was put on Wellbutrin/not taking.  Did not think it helped.  Assessment/Plan:   1. Vitamin D deficiency Refill - Vitamin D, Ergocalciferol, (DRISDOL) 1.25 MG (50000 UNIT) CAPS capsule; Take 1 capsule by mouth every 7 days.  Dispense: 4 capsule; Refill: 0  2. Insulin resistance Refill - metFORMIN (GLUCOPHAGE) 500 MG tablet; Take 1 tablet by mouth 2  times daily with a meal.  Dispense: 60 tablet; Refill: 0  3. Other depression, emotional eating Will remain on Wellbutrin.  4. Obesity, Current BMI 37.3 1.  Will adhere to the plan 80 to 90% of the time. 2.  Mindful eating. 3.  Increase water and protein.  Jacqueline Good is currently in the action stage of change. As such, her goal is to continue with weight loss efforts. She has agreed to the Category 2 Plan.   Exercise goals: None due to pain in back and legs.  Behavioral modification strategies: increasing lean protein intake, decreasing simple carbohydrates, increasing vegetables, increasing water intake, decreasing eating out, no skipping meals, meal planning and cooking strategies, keeping healthy foods in the home, and planning for  success.  Jacqueline Good has agreed to follow-up with our clinic in 4 weeks. She was informed of the importance of frequent follow-up visits to maximize her success with intensive lifestyle modifications for her multiple health conditions.    Objective:   Blood pressure 116/79, pulse 66, temperature 98.1 F (36.7 C), height '5\' 4"'$  (1.626 m), weight 217 lb (98.4 kg), SpO2 96 %. Body mass index is 37.25 kg/m.  General: Cooperative, alert, well developed, in no acute distress. HEENT: Conjunctivae and lids unremarkable. Cardiovascular: Regular rhythm.  Lungs: Normal work of breathing. Neurologic: No focal deficits.   Lab Results  Component Value Date   CREATININE 1.02 (H) 12/26/2021   BUN 23 12/26/2021   NA 141 12/26/2021   K 3.8 12/26/2021   CL 105 12/26/2021   CO2 28 12/26/2021   Lab Results  Component Value Date   ALT 12 12/26/2021   AST 14 (L) 12/26/2021   ALKPHOS 78 12/26/2021   BILITOT 0.7 12/26/2021   Lab Results  Component Value Date   HGBA1C 5.1 11/27/2021   HGBA1C 5.5 03/21/2021   HGBA1C 5.4 04/27/2014   Lab Results  Component Value Date   INSULIN 11.7 11/27/2021   INSULIN 31.4 (H) 03/21/2021   Lab Results  Component Value Date   TSH 2.200 03/21/2021   Lab Results  Component Value Date   CHOL 195 11/27/2021   HDL 44 11/27/2021   LDLCALC 127 (H) 11/27/2021   TRIG 136 11/27/2021   CHOLHDL 3.2 02/18/2019   Lab Results  Component Value Date  VD25OH 33.2 11/27/2021   VD25OH 14.4 (L) 03/21/2021   Lab Results  Component Value Date   WBC 6.4 12/26/2021   HGB 12.9 12/26/2021   HCT 39.6 12/26/2021   MCV 87.4 12/26/2021   PLT 211 12/26/2021   No results found for: "IRON", "TIBC", "FERRITIN"  Attestation Statements:   Reviewed by clinician on day of visit: allergies, medications, problem list, medical history, surgical history, family history, social history, and previous encounter notes.  I, Dawn Whitmire, FNP-C, am acting as transcriptionist for Dr.  Jearld Lesch.  I have reviewed the above documentation for accuracy and completeness, and I agree with the above. Jearld Lesch, DO

## 2022-03-27 ENCOUNTER — Encounter (INDEPENDENT_AMBULATORY_CARE_PROVIDER_SITE_OTHER): Payer: Self-pay | Admitting: Bariatrics

## 2022-04-05 ENCOUNTER — Other Ambulatory Visit (HOSPITAL_COMMUNITY): Payer: Self-pay

## 2022-04-16 ENCOUNTER — Other Ambulatory Visit: Payer: Self-pay | Admitting: Psychiatry

## 2022-04-16 ENCOUNTER — Other Ambulatory Visit (HOSPITAL_COMMUNITY): Payer: Self-pay

## 2022-04-16 DIAGNOSIS — F330 Major depressive disorder, recurrent, mild: Secondary | ICD-10-CM

## 2022-04-16 DIAGNOSIS — F411 Generalized anxiety disorder: Secondary | ICD-10-CM

## 2022-04-16 MED ORDER — VENLAFAXINE HCL ER 75 MG PO CP24
225.0000 mg | ORAL_CAPSULE | Freq: Every day | ORAL | 1 refills | Status: DC
  Filled 2022-04-16: qty 270, 90d supply, fill #0
  Filled 2022-07-15: qty 270, 90d supply, fill #1

## 2022-04-16 MED ORDER — MIRTAZAPINE 30 MG PO TABS
30.0000 mg | ORAL_TABLET | Freq: Every day | ORAL | 1 refills | Status: DC
  Filled 2022-04-16: qty 90, 90d supply, fill #0
  Filled 2022-07-15: qty 90, 90d supply, fill #1

## 2022-04-17 ENCOUNTER — Other Ambulatory Visit (HOSPITAL_COMMUNITY): Payer: Self-pay

## 2022-04-17 ENCOUNTER — Ambulatory Visit (INDEPENDENT_AMBULATORY_CARE_PROVIDER_SITE_OTHER): Payer: Medicare Other | Admitting: Bariatrics

## 2022-04-17 MED ORDER — PROAIR RESPICLICK 108 (90 BASE) MCG/ACT IN AEPB
INHALATION_SPRAY | RESPIRATORY_TRACT | 0 refills | Status: DC
  Filled 2022-04-17: qty 1, 30d supply, fill #0

## 2022-04-23 ENCOUNTER — Encounter: Payer: Self-pay | Admitting: Bariatrics

## 2022-04-23 ENCOUNTER — Ambulatory Visit (INDEPENDENT_AMBULATORY_CARE_PROVIDER_SITE_OTHER): Payer: Medicare Other | Admitting: Bariatrics

## 2022-04-23 ENCOUNTER — Other Ambulatory Visit (HOSPITAL_COMMUNITY): Payer: Self-pay

## 2022-04-23 VITALS — BP 132/92 | HR 71 | Temp 97.8°F | Ht 64.0 in | Wt 216.0 lb

## 2022-04-23 DIAGNOSIS — E88819 Insulin resistance, unspecified: Secondary | ICD-10-CM

## 2022-04-23 DIAGNOSIS — E559 Vitamin D deficiency, unspecified: Secondary | ICD-10-CM

## 2022-04-23 DIAGNOSIS — E8881 Metabolic syndrome: Secondary | ICD-10-CM

## 2022-04-23 DIAGNOSIS — Z6837 Body mass index (BMI) 37.0-37.9, adult: Secondary | ICD-10-CM

## 2022-04-23 DIAGNOSIS — R632 Polyphagia: Secondary | ICD-10-CM | POA: Diagnosis not present

## 2022-04-23 DIAGNOSIS — E669 Obesity, unspecified: Secondary | ICD-10-CM

## 2022-04-23 DIAGNOSIS — F3289 Other specified depressive episodes: Secondary | ICD-10-CM

## 2022-04-23 DIAGNOSIS — Z6841 Body Mass Index (BMI) 40.0 and over, adult: Secondary | ICD-10-CM

## 2022-04-23 MED ORDER — BUPROPION HCL ER (SR) 150 MG PO TB12
150.0000 mg | ORAL_TABLET | Freq: Every day | ORAL | 0 refills | Status: DC
  Filled 2022-04-23: qty 30, 30d supply, fill #0

## 2022-04-23 MED ORDER — VITAMIN D (ERGOCALCIFEROL) 1.25 MG (50000 UNIT) PO CAPS
50000.0000 [IU] | ORAL_CAPSULE | ORAL | 0 refills | Status: DC
  Filled 2022-04-23: qty 4, 28d supply, fill #0

## 2022-04-23 MED ORDER — PHENTERMINE HCL 15 MG PO CAPS
15.0000 mg | ORAL_CAPSULE | ORAL | 0 refills | Status: DC
  Filled 2022-04-23: qty 30, 30d supply, fill #0

## 2022-04-23 MED ORDER — METFORMIN HCL 500 MG PO TABS
500.0000 mg | ORAL_TABLET | Freq: Two times a day (BID) | ORAL | 0 refills | Status: DC
  Filled 2022-04-23: qty 60, 30d supply, fill #0

## 2022-04-23 MED ORDER — TOPIRAMATE 50 MG PO TABS
50.0000 mg | ORAL_TABLET | Freq: Every day | ORAL | 0 refills | Status: DC
  Filled 2022-04-23: qty 30, 30d supply, fill #0

## 2022-04-30 ENCOUNTER — Encounter: Payer: Self-pay | Admitting: Bariatrics

## 2022-04-30 NOTE — Progress Notes (Signed)
Chief Complaint:   OBESITY Jacqueline Good is here to discuss her progress with her obesity treatment plan along with follow-up of her obesity related diagnoses. Jacqueline Good is on the Category 2 Plan and states she is following her eating plan approximately 50% of the time. Jacqueline Good states she is doing arm weights for 5 minutes 5 times per week.  Today's visit was #: 64 Starting weight: 238 lbs Starting date: 03/21/2021 Today's weight: 216 lbs Today's date: 04/23/2022 Total lbs lost to date: 22 Total lbs lost since last in-office visit: 1  Interim History: Jacqueline Good is down 1 lb since her last visit. She is getting in adequate protein.  Subjective:   1. Insulin resistance Jacqueline Good is taking metformin.   2. Vitamin D deficiency Jacqueline Good is taking Vitamin D as directed.   3. Polyphagia Jacqueline Good has no coverage for GLP-1's. She denies cardiac issues, and she is not on blood pressure medications.   4. Other depression, emotional eating Jacqueline Good is taking her medications as directed and she notes it helps with stress eating.   Assessment/Plan:   1. Insulin resistance Jacqueline Good will continue metformin, and we will refill for 1 month.  - metFORMIN (GLUCOPHAGE) 500 MG tablet; Take 1 tablet by mouth 2  times daily with a meal.  Dispense: 60 tablet; Refill: 0  2. Vitamin D deficiency We will refill prescription Vitamin D for 1 month. Jacqueline Good will follow-up for routine testing of Vitamin D, at least 2-3 times per year to avoid over-replacement.  - Vitamin D, Ergocalciferol, (DRISDOL) 1.25 MG (50000 UNIT) CAPS capsule; Take 1 capsule by mouth every 7 days.  Dispense: 4 capsule; Refill: 0  3. Polyphagia Jacqueline Good agreed to start phentermine 15 mg q AM with no refills.   - phentermine 15 MG capsule; Take 1 capsule (15 mg total) by mouth every morning.  Dispense: 30 capsule; Refill: 0  4. Other depression, emotional eating Jacqueline Good will continue her medications, and we will refill Topamax and  Wellbutrin SR for 1 month.  - buPROPion (WELLBUTRIN SR) 150 MG 12 hr tablet; Take 1 tablet by mouth daily.  Dispense: 30 tablet; Refill: 0 - topiramate (TOPAMAX) 50 MG tablet; Take 1 tablet by mouth daily with supper.  Dispense: 30 tablet; Refill: 0  5. Obesity, Current BMI 37.1 Jacqueline Good is currently in the action stage of change. As such, her goal is to continue with weight loss efforts. She has agreed to the Category 2 Plan.   She will adhere to the plan 80-90%. She will decrease her carbohydrates.   We discussed various medication options to help Jacqueline Good with her weight loss efforts and we both agreed to start phentermine 15 mg daily with no refills.  - phentermine 15 MG capsule; Take 1 capsule (15 mg total) by mouth every morning.  Dispense: 30 capsule; Refill: 0  Exercise goals: As is.   Behavioral modification strategies: increasing lean protein intake, decreasing simple carbohydrates, increasing vegetables, increasing water intake, decreasing eating out, no skipping meals, meal planning and cooking strategies, keeping healthy foods in the home, and planning for success.  Jacqueline Good has agreed to follow-up with our clinic in 4 weeks. She was informed of the importance of frequent follow-up visits to maximize her success with intensive lifestyle modifications for her multiple health conditions.   Objective:   Blood pressure (!) 132/92, pulse 71, temperature 97.8 F (36.6 C), height '5\' 4"'$  (1.626 m), weight 216 lb (98 kg), SpO2 98 %. Body mass index is 37.08 kg/m.  General:  Cooperative, alert, well developed, in no acute distress. HEENT: Conjunctivae and lids unremarkable. Cardiovascular: Regular rhythm.  Lungs: Normal work of breathing. Neurologic: No focal deficits.   Lab Results  Component Value Date   CREATININE 1.02 (H) 12/26/2021   BUN 23 12/26/2021   NA 141 12/26/2021   K 3.8 12/26/2021   CL 105 12/26/2021   CO2 28 12/26/2021   Lab Results  Component Value Date   ALT  12 12/26/2021   AST 14 (L) 12/26/2021   ALKPHOS 78 12/26/2021   BILITOT 0.7 12/26/2021   Lab Results  Component Value Date   HGBA1C 5.1 11/27/2021   HGBA1C 5.5 03/21/2021   HGBA1C 5.4 04/27/2014   Lab Results  Component Value Date   INSULIN 11.7 11/27/2021   INSULIN 31.4 (H) 03/21/2021   Lab Results  Component Value Date   TSH 2.200 03/21/2021   Lab Results  Component Value Date   CHOL 195 11/27/2021   HDL 44 11/27/2021   LDLCALC 127 (H) 11/27/2021   TRIG 136 11/27/2021   CHOLHDL 3.2 02/18/2019   Lab Results  Component Value Date   VD25OH 33.2 11/27/2021   VD25OH 14.4 (L) 03/21/2021   Lab Results  Component Value Date   WBC 6.4 12/26/2021   HGB 12.9 12/26/2021   HCT 39.6 12/26/2021   MCV 87.4 12/26/2021   PLT 211 12/26/2021   No results found for: "IRON", "TIBC", "FERRITIN"  Attestation Statements:   Reviewed by clinician on day of visit: allergies, medications, problem list, medical history, surgical history, family history, social history, and previous encounter notes.   Jacqueline Good, am acting as Location manager for [employer], DO.  I have reviewed the above documentation for accuracy and completeness, and I agree with the above. Jacqueline Lesch, DO

## 2022-05-03 NOTE — Progress Notes (Unsigned)
Virtual Visit via Video Note  I connected with Jacqueline Good on 05/07/22 at  2:00 PM EDT by a video enabled telemedicine application and verified that I am speaking with the correct person using two identifiers.  Location: Patient: home Provider: office Persons participated in the visit- patient, provider    I discussed the limitations of evaluation and management by telemedicine and the availability of in person appointments. The patient expressed understanding and agreed to proceed.   I discussed the assessment and treatment plan with the patient. The patient was provided an opportunity to ask questions and all were answered. The patient agreed with the plan and demonstrated an understanding of the instructions.   The patient was advised to call back or seek an in-person evaluation if the symptoms worsen or if the condition fails to improve as anticipated.  I provided 21 minutes of non-face-to-face time during this encounter.   Norman Clay, MD    [facility] MD/PA/NP OP Progress Note  05/07/2022 2:33 PM Jacqueline Good  MRN:  [id number]  Chief Complaint:  Chief Complaint  Patient presents with   Depression   Follow-up   HPI:  This is a follow-up appointment for depression and anxiety.  She states that she is concerned about her brother.  He is missing for 10 days.  He does not answer the phone.  She is wary of contacting the police as he has some warrants.  She feels stressed out.  She tries not to show it to her mother as she does not want her to be concerned.  She continues to struggle with pain.  She has insomnia.  She has fair appetite.  She feels anxious and depressed.  She denies SI.  She denies panic attacks.  She would like to come off venlafaxine as she does not think it is helping, although she denies side effects.  She does not think she can come off Xanax due to her significant anxiety.    Daily routine: she denies having routine, doing housed hold chores, visiting her  mother  Employment: used to work as Research scientist (physical sciences), last in 2017; although the patient was terminated, it was a good timing for the patient as she was taking care of her cousin, who later moved into nursing home.  Support: Household: by herself. Her mother lives next door Marital status: single  Number of children:1 daughter, who lives in [place]. She has estranged relationship with the father of her daughter.  She grew up in [place].  Her parents got divorced when she was 29-year-old.  Although she did not like them arguing with each other, she reports good relationship with her mother, who raised the patient and other siblings.   Visit Diagnosis:    ICD-10-CM   1. MDD (major depressive disorder), recurrent episode, moderate (HCC)  F33.1     2. Insomnia, unspecified type  G47.00 zolpidem (AMBIEN) 10 MG tablet    3. Generalized anxiety disorder  F41.1       Past Psychiatric History: Please see initial evaluation for full details. I have reviewed the history. No updates at this time.     Past Medical History:  Past Medical History:  Diagnosis Date   Anemia    Anxiety    Arthritis    "all over" (08/12/2018)   Asthma    Back pain    Benign paroxysmal positional vertigo 10/31/2014   Breast cancer, right breast ([place]) dx'd 02/2018   Chronic bronchitis ([place])    Depression  Family history of breast cancer    Family history of prostate cancer    Family history of uterine cancer    GERD (gastroesophageal reflux disease)    History of blood transfusion    "several; related to low blood" (08/12/2018)   History of radiation therapy 11/15/18- 12/17/18   Right Chest wall, SCV, PAB. 25 fractions.    Lymphedema of right arm    Palpitations    Personal history of chemotherapy    Personal history of radiation therapy     Past Surgical History:  Procedure Laterality Date   AXILLARY LYMPH NODE DISSECTION Right 08/12/2018   Procedure: AXILLARY LYMPH NODE DISSECTION;  Surgeon: Alphonsa Overall, MD;   Location: [place];  Service: General;  Laterality: Right;   BREAST BIOPSY Right 03/2018   BREAST RECONSTRUCTION WITH PLACEMENT OF TISSUE EXPANDER AND FLEX HD (ACELLULAR HYDRATED DERMIS) Right 08/12/2018   Procedure: RIGHT BREAST RECONSTRUCTION WITH PLACEMENT OF TISSUE EXPANDER AND FLEX HD (ACELLULAR HYDRATED DERMIS);  Surgeon: Wallace Going, DO;  Location: [place];  Service: Plastics;  Laterality: Right;   BREAST REDUCTION SURGERY Left 09/28/2019   Procedure: LEFT MAMMARY REDUCTION/MASTOPEXY  (BREAST);  Surgeon: Wallace Going, DO;  Location: [place];  Service: Plastics;  Laterality: Left;   DILATION AND CURETTAGE OF UTERUS     ENDOMETRIAL ABLATION     IR IMAGING GUIDED PORT INSERTION  03/18/2018   MASTECTOMY MODIFIED RADICAL Right 08/12/2018   Procedure: RIGHT MODIFIED RADICAL MASTECTOMY;  Surgeon: Alphonsa Overall, MD;  Location: [place];  Service: General;  Laterality: Right;   MASTECTOMY MODIFIED RADICAL Right 08/12/2018   w/axillary LND   MYOMECTOMY     REDUCTION MAMMAPLASTY Left 2021   REMOVAL OF TISSUE EXPANDER AND PLACEMENT OF IMPLANT Right 10/26/2018   Procedure: Removal of right breast expander;  Surgeon: Wallace Going, DO;  Location: [place];  Service: Plastics;  Laterality: Right;  75 min, please    Family Psychiatric History: Please see initial evaluation for full details. I have reviewed the history. No updates at this time.     Family History:  Family History  Problem Relation Age of Onset   Lupus Sister    Heart disease Sister    Breast cancer Mother 62   Prostate cancer Paternal Uncle    Diabetes Maternal Grandmother    Heart Problems Maternal Grandmother    Prostate cancer Maternal Grandfather    Prostate cancer Paternal Grandfather    Prostate cancer Other        MGFs brother   Breast cancer Other        Mat great-grandmother's sister   Uterine cancer Maternal Great-grandmother    Prostate cancer Other        PGFs brother    Social  History:  Social History   Socioeconomic History   Marital status: Single    Spouse name: Not on file   Number of children: Not on file   Years of education: Not on file   Highest education level: Not on file  Occupational History   Not on file  Tobacco Use   Smoking status: Former    Packs/day: 1.00    Years: 38.00    Total pack years: 38.00    Types: Cigarettes    Quit date: 05/15/2018    Years since quitting: 3.9   Smokeless tobacco: Never  Vaping Use   Vaping Use: Never used  Substance and Sexual Activity   Alcohol use: Yes  Comment: occ   Drug use: No   Sexual activity: Not Currently    Birth control/protection: None  Other Topics Concern   Not on file  Social History Narrative   Not on file   Social Determinants of Health   Financial Resource Strain: Not on file  Food Insecurity: Not on file  Transportation Needs: No Transportation Needs (09/07/2018)   PRAPARE - Transportation    Lack of Transportation (Medical): No    Lack of Transportation (Non-Medical): No  Physical Activity: Not on file  Stress: Not on file  Social Connections: Not on file    Allergies:  Allergies  Allergen Reactions   Gadolinium Derivatives Itching and Cough    Pt began sneezing and coughing as soon as MRI contrast was injected. Severe nasal congestion. No rash or hives no SOB.    Hydrocodone Hives and Itching    Pt says she can take it with benadryl.    Iodinated Contrast Media Itching    Metabolic Disorder Labs: Lab Results  Component Value Date   HGBA1C 5.1 11/27/2021   No results found for: "PROLACTIN" Lab Results  Component Value Date   CHOL 195 11/27/2021   TRIG 136 11/27/2021   HDL 44 11/27/2021   CHOLHDL 3.2 02/18/2019   VLDL 23 02/18/2019   LDLCALC 127 (H) 11/27/2021   LDLCALC 93 03/21/2021   Lab Results  Component Value Date   TSH 2.200 03/21/2021   TSH 0.58 09/24/2016    Therapeutic Level Labs: No results found for: "LITHIUM" No results found for:  "VALPROATE" No results found for: "CBMZ"  Current Medications: Current Outpatient Medications  Medication Sig Dispense Refill   Albuterol Sulfate (PROAIR RESPICLICK) 786 (90 Base) MCG/ACT AEPB Inhale 2 puffs into the lungs every 4 hours as needed 1 each 0   [START ON 05/17/2022] ALPRAZolam (XANAX) 1 MG tablet May take 1 tablet by mouth 3 times daily as needed for anxiety. May also take 1/2 tablet daily as needed for anxiety. 105 tablet 0   buPROPion (WELLBUTRIN SR) 150 MG 12 hr tablet Take 1 tablet by mouth daily. 30 tablet 0   esomeprazole (NEXIUM) 40 MG capsule TAKE 1 CAPSULE (40 MG TOTAL) BY MOUTH DAILY. 30 capsule 2   HYDROcodone-acetaminophen (NORCO/VICODIN) 5-325 MG tablet Take 1 tablet by mouth twice a day as needed 10 tablet 0   HYDROcodone-acetaminophen (NORCO/VICODIN) 5-325 MG tablet Take 1 tablet by mouth twice a day as needed. 10 tablet 0   LATUDA 40 MG TABS tablet Take 1 tablet (40 mg total) by mouth daily with breakfast. 90 tablet 0   loratadine (CLARITIN) 10 MG tablet Take 1 tablet (10 mg total) by mouth daily as needed for allergies. 30 tablet 11   metFORMIN (GLUCOPHAGE) 500 MG tablet Take 1 tablet by mouth 2  times daily with a meal. 60 tablet 0   mirtazapine (REMERON) 30 MG tablet Take 1 tablet (30 mg total) by mouth at bedtime. 90 tablet 1   Multiple Vitamin (MULTIVITAMIN) capsule Take 1 capsule by mouth daily.     omeprazole (PRILOSEC) 40 MG capsule TAKE 1 CAPSULE BY MOUTH DAILY. 30 capsule 2   phentermine 15 MG capsule Take 1 capsule (15 mg total) by mouth every morning. 30 capsule 0   propranolol (INDERAL) 40 MG tablet Take 40 mg by mouth daily as needed (heart palpitation.).      tiZANidine (ZANAFLEX) 4 MG tablet Take 1 tablet by mouth twice daily as needed. 30 tablet 0   topiramate (  TOPAMAX) 50 MG tablet Take 1 tablet by mouth daily with supper. 30 tablet 0   venlafaxine XR (EFFEXOR-XR) 75 MG 24 hr capsule Take 3 capsules (225 mg total) by mouth daily with breakfast. 270  capsule 1   Vitamin D, Ergocalciferol, (DRISDOL) 1.25 MG (50000 UNIT) CAPS capsule Take 1 capsule by mouth every 7 days. 4 capsule 0   [START ON 05/17/2022] zolpidem (AMBIEN) 10 MG tablet Take 1 tablet by mouth at bedtime as needed. 30 tablet 0   No current facility-administered medications for this visit.     Musculoskeletal: Strength & Muscle Tone:  N/A Gait & Station:  N/A Patient leans: N/A  Psychiatric Specialty Exam: Review of Systems  Psychiatric/Behavioral:  Positive for dysphoric mood and sleep disturbance. Negative for agitation, behavioral problems, confusion, decreased concentration, hallucinations, self-injury and suicidal ideas. The patient is nervous/anxious. The patient is not hyperactive.   All other systems reviewed and are negative.   There were no vitals taken for this visit.There is no height or weight on file to calculate BMI.  General Appearance: Fairly Groomed  Eye Contact:  Good  Speech:  Clear and Coherent  Volume:  Normal  Mood:  Depressed  Affect:  Appropriate, Congruent, and down  Thought Process:  Coherent  Orientation:  Full (Time, Place, and Person)  Thought Content: Logical   Suicidal Thoughts:  No  Homicidal Thoughts:  No  Memory:  Immediate;   Good  Judgement:  Good  Insight:  Good  Psychomotor Activity:  Normal  Concentration:  Concentration: Good and Attention Span: Good  Recall:  Good  Fund of Knowledge: Good  Language: Good  Akathisia:  No  Handed:  Right  AIMS (if indicated): not done  Assets:  Communication Skills Desire for Improvement  ADL's:  Intact  Cognition: WNL  Sleep:  Poor   Screenings: GAD-7    Flowsheet Row Office Visit from 04/22/2018 in [facility] Office Visit from 06/03/2017 in [facility] Office Visit from 09/24/2016 in [facility] Office Visit from 03/27/2016 in [facility]  Total GAD-7 Score '11  3 15 16      '$ PHQ2-9    Flowsheet Row Video Visit from 09/23/2021 in [facility] Video Visit from 04/09/2021 in [facility] Office Visit from 03/21/2021 in [facility] Video Visit from 11/07/2020 in [facility] Office Visit from 04/22/2018 in [facility]  PHQ-2 Total Score '4 6 1 4 5  '$ PHQ-9 Total Score '9 11 4 11 15      '$ Flowsheet Row Video Visit from 01/08/2021 in [facility] No Risk        Assessment and Plan:  Jacqueline Good is a 61 y.o. year old female with a history of depression, OCD, estrogen negative T2 pN1, stage IIB invasive ductal carcinoma, diagnosed 02/25/2018 s/p mastectomy 08/2018, s/p neoadjuvant chemo immunotherapy/on trastzumab, asthma, OA, chronic back pain, who presents for follow up appointment for below.   2. MDD (major depressive disorder), recurrent episode, moderate ([place]) 3. Generalized anxiety disorder She reports worsening in anxiety in the context of her brother with history of drug use, who is missing without any notice.  Other psychosocial stressors includes conflict with her siblings, unemployment, chronic pain, mother's medical condition.  She asked [place] again to taper off venlafaxine, although she denies any side effect.  After discussion at length, she agrees to stay on venlafaxine and mirtazapine to target depression and anxiety.  It was also discussed with the patient that Xanax will be tapered down to avoid long-term side effect at the next visit.  Although she will greatly benefit from CBT, she is not interested in this.  She declined to come in person visit as she does not drive highway.   1. Insomnia, unspecified type Worsening in the context of worsening in an anxiety.  Will continue current dose of Ambien to target insomnia. She is not interested in evaluation of sleep apnea,  although it was recommended due to her snoring in the past.    # inattention Unchanged. She has a history of worsening in concentration over the past few years before chemotherapy.  No history of IEP/ADHD.  The etiology is likely multifactorial given her ongoing mood symptoms.  She is not interested in a neuropsychological evaluation.      Plan Continue venlafaxine 225 mg daily Continue latuda 40 mg daily  Continue mirtazapine 30 mg at night  Continue Xanax 1 mg three times a day (and additional 0.5 mg daily) as needed for anxiety    Continue Ambien 10 mg at night as needed for insomnia Next appointment: 11/6 at 1 PM for 30 mins, video. She declined in person visit as she does not drive highway.    Past trials of medication: sertraline, fluoxetine, lexapro, Buspar, venlafaxine, duloxetine, bupropion, quetiapine, Abilify   The patient demonstrates the following risk factors for suicide: Chronic risk factors for suicide include: psychiatric disorder of depression and medical illness of breast cancer. Acute risk factors for suicide include: unemployment and loss (financial, interpersonal, professional). Protective factors for this patient include: positive social support, responsibility to others (children, family), coping skills and hope for the future. Considering these factors, the overall suicide risk at this point appears to be low. Patient is appropriate for outpatient follow up.      Collaboration of Care: Collaboration of Care: Other reviewed notes in Epic  Patient/Guardian was advised Release of Information must be obtained prior to any record release in order to collaborate their care with an outside provider. Patient/Guardian was advised if they have not already done so to contact the registration department to sign all necessary forms in order for [place] to release information regarding their care.   Consent: Patient/Guardian gives verbal consent for treatment and assignment of benefits  for services provided during this visit. Patient/Guardian expressed understanding and agreed to proceed.    Norman Clay, MD 05/07/2022, 2:33 PM

## 2022-05-07 ENCOUNTER — Telehealth (INDEPENDENT_AMBULATORY_CARE_PROVIDER_SITE_OTHER): Payer: Medicare Other | Admitting: Psychiatry

## 2022-05-07 ENCOUNTER — Other Ambulatory Visit (HOSPITAL_COMMUNITY): Payer: Self-pay

## 2022-05-07 ENCOUNTER — Encounter: Payer: Self-pay | Admitting: Psychiatry

## 2022-05-07 DIAGNOSIS — F411 Generalized anxiety disorder: Secondary | ICD-10-CM | POA: Diagnosis not present

## 2022-05-07 DIAGNOSIS — G4700 Insomnia, unspecified: Secondary | ICD-10-CM | POA: Diagnosis not present

## 2022-05-07 DIAGNOSIS — F331 Major depressive disorder, recurrent, moderate: Secondary | ICD-10-CM | POA: Diagnosis not present

## 2022-05-07 MED ORDER — ZOLPIDEM TARTRATE 10 MG PO TABS
10.0000 mg | ORAL_TABLET | Freq: Every evening | ORAL | 0 refills | Status: DC | PRN
  Filled 2022-05-07 – 2022-05-14 (×2): qty 30, 30d supply, fill #0

## 2022-05-07 MED ORDER — LATUDA 40 MG PO TABS
40.0000 mg | ORAL_TABLET | Freq: Every day | ORAL | 0 refills | Status: DC
  Filled 2022-05-07: qty 30, 30d supply, fill #0
  Filled 2022-07-15: qty 30, 30d supply, fill #1

## 2022-05-07 MED ORDER — ALPRAZOLAM 1 MG PO TABS
ORAL_TABLET | ORAL | 0 refills | Status: DC
Start: 2022-05-17 — End: 2022-06-16
  Filled 2022-05-17: qty 105, 30d supply, fill #0

## 2022-05-08 ENCOUNTER — Other Ambulatory Visit (HOSPITAL_COMMUNITY): Payer: Self-pay

## 2022-05-12 ENCOUNTER — Other Ambulatory Visit (HOSPITAL_COMMUNITY): Payer: Self-pay

## 2022-05-12 DIAGNOSIS — Z0000 Encounter for general adult medical examination without abnormal findings: Secondary | ICD-10-CM | POA: Diagnosis not present

## 2022-05-12 DIAGNOSIS — J4540 Moderate persistent asthma, uncomplicated: Secondary | ICD-10-CM | POA: Diagnosis not present

## 2022-05-12 DIAGNOSIS — I10 Essential (primary) hypertension: Secondary | ICD-10-CM | POA: Diagnosis not present

## 2022-05-12 DIAGNOSIS — Z87891 Personal history of nicotine dependence: Secondary | ICD-10-CM | POA: Diagnosis not present

## 2022-05-12 DIAGNOSIS — Z23 Encounter for immunization: Secondary | ICD-10-CM | POA: Diagnosis not present

## 2022-05-12 MED ORDER — FLUTICASONE-SALMETEROL 100-50 MCG/ACT IN AEPB
1.0000 | INHALATION_SPRAY | Freq: Two times a day (BID) | RESPIRATORY_TRACT | 4 refills | Status: DC
  Filled 2022-05-12: qty 60, 30d supply, fill #0
  Filled 2022-06-18: qty 60, 30d supply, fill #1
  Filled 2022-07-30 (×2): qty 60, 30d supply, fill #2

## 2022-05-13 ENCOUNTER — Other Ambulatory Visit (HOSPITAL_COMMUNITY): Payer: Self-pay

## 2022-05-13 ENCOUNTER — Other Ambulatory Visit: Payer: Self-pay | Admitting: Physician Assistant

## 2022-05-13 DIAGNOSIS — Z87891 Personal history of nicotine dependence: Secondary | ICD-10-CM

## 2022-05-14 ENCOUNTER — Other Ambulatory Visit (HOSPITAL_COMMUNITY): Payer: Self-pay

## 2022-05-15 ENCOUNTER — Other Ambulatory Visit (HOSPITAL_COMMUNITY): Payer: Self-pay

## 2022-05-17 ENCOUNTER — Other Ambulatory Visit (HOSPITAL_COMMUNITY): Payer: Self-pay

## 2022-05-19 ENCOUNTER — Other Ambulatory Visit (HOSPITAL_COMMUNITY): Payer: Self-pay

## 2022-05-22 ENCOUNTER — Other Ambulatory Visit (HOSPITAL_COMMUNITY): Payer: Self-pay

## 2022-05-22 ENCOUNTER — Encounter: Payer: Self-pay | Admitting: Bariatrics

## 2022-05-22 ENCOUNTER — Ambulatory Visit (INDEPENDENT_AMBULATORY_CARE_PROVIDER_SITE_OTHER): Payer: Medicare Other | Admitting: Bariatrics

## 2022-05-22 DIAGNOSIS — F3289 Other specified depressive episodes: Secondary | ICD-10-CM | POA: Diagnosis not present

## 2022-05-22 DIAGNOSIS — R632 Polyphagia: Secondary | ICD-10-CM

## 2022-05-22 DIAGNOSIS — Z6835 Body mass index (BMI) 35.0-35.9, adult: Secondary | ICD-10-CM

## 2022-05-22 DIAGNOSIS — E669 Obesity, unspecified: Secondary | ICD-10-CM

## 2022-05-22 DIAGNOSIS — E559 Vitamin D deficiency, unspecified: Secondary | ICD-10-CM

## 2022-05-22 DIAGNOSIS — E88819 Insulin resistance, unspecified: Secondary | ICD-10-CM | POA: Diagnosis not present

## 2022-05-22 MED ORDER — VITAMIN D (ERGOCALCIFEROL) 1.25 MG (50000 UNIT) PO CAPS
50000.0000 [IU] | ORAL_CAPSULE | ORAL | 0 refills | Status: DC
  Filled 2022-05-22: qty 4, 28d supply, fill #0

## 2022-05-22 MED ORDER — PHENTERMINE HCL 15 MG PO CAPS
15.0000 mg | ORAL_CAPSULE | ORAL | 0 refills | Status: DC
  Filled 2022-05-22: qty 30, 30d supply, fill #0

## 2022-05-22 MED ORDER — TOPIRAMATE 50 MG PO TABS
50.0000 mg | ORAL_TABLET | Freq: Every day | ORAL | 0 refills | Status: DC
  Filled 2022-05-22: qty 30, 30d supply, fill #0

## 2022-05-22 MED ORDER — METFORMIN HCL 500 MG PO TABS
500.0000 mg | ORAL_TABLET | Freq: Two times a day (BID) | ORAL | 0 refills | Status: DC
  Filled 2022-05-22: qty 60, 30d supply, fill #0

## 2022-05-26 ENCOUNTER — Other Ambulatory Visit (HOSPITAL_COMMUNITY): Payer: Self-pay

## 2022-05-26 DIAGNOSIS — M545 Low back pain, unspecified: Secondary | ICD-10-CM | POA: Diagnosis not present

## 2022-05-26 DIAGNOSIS — Z1159 Encounter for screening for other viral diseases: Secondary | ICD-10-CM | POA: Diagnosis not present

## 2022-05-26 DIAGNOSIS — M129 Arthropathy, unspecified: Secondary | ICD-10-CM | POA: Diagnosis not present

## 2022-05-26 DIAGNOSIS — E559 Vitamin D deficiency, unspecified: Secondary | ICD-10-CM | POA: Diagnosis not present

## 2022-05-26 DIAGNOSIS — Z79899 Other long term (current) drug therapy: Secondary | ICD-10-CM | POA: Diagnosis not present

## 2022-05-26 DIAGNOSIS — Z131 Encounter for screening for diabetes mellitus: Secondary | ICD-10-CM | POA: Diagnosis not present

## 2022-05-26 MED ORDER — OXYCODONE-ACETAMINOPHEN 10-325 MG PO TABS
1.0000 | ORAL_TABLET | Freq: Three times a day (TID) | ORAL | 0 refills | Status: DC | PRN
  Filled 2022-05-26: qty 45, 15d supply, fill #0

## 2022-05-27 ENCOUNTER — Other Ambulatory Visit (HOSPITAL_COMMUNITY): Payer: Self-pay

## 2022-05-27 DIAGNOSIS — E559 Vitamin D deficiency, unspecified: Secondary | ICD-10-CM | POA: Diagnosis not present

## 2022-05-27 DIAGNOSIS — M545 Low back pain, unspecified: Secondary | ICD-10-CM | POA: Diagnosis not present

## 2022-05-27 DIAGNOSIS — Z131 Encounter for screening for diabetes mellitus: Secondary | ICD-10-CM | POA: Diagnosis not present

## 2022-05-27 DIAGNOSIS — Z79899 Other long term (current) drug therapy: Secondary | ICD-10-CM | POA: Diagnosis not present

## 2022-05-27 DIAGNOSIS — M129 Arthropathy, unspecified: Secondary | ICD-10-CM | POA: Diagnosis not present

## 2022-05-27 DIAGNOSIS — Z1159 Encounter for screening for other viral diseases: Secondary | ICD-10-CM | POA: Diagnosis not present

## 2022-05-27 MED ORDER — NALOXONE HCL 4 MG/0.1ML NA LIQD
NASAL | 1 refills | Status: DC
  Filled 2022-05-27: qty 2, 30d supply, fill #0

## 2022-05-28 ENCOUNTER — Other Ambulatory Visit (HOSPITAL_COMMUNITY): Payer: Self-pay

## 2022-05-28 NOTE — Progress Notes (Unsigned)
Chief Complaint:   OBESITY Jacqueline Good is here to discuss her progress with her obesity treatment plan along with follow-up of her obesity related diagnoses. Jacqueline Good is on the Category 2 Plan and states she is following her eating plan approximately 70% of the time. Jacqueline Good states she is doing arm weights for 3-5 minutes 3-5 times per week.  Today's visit was #: 71 Starting weight: 238 lbs Starting date: 03/21/2021 Today's weight: 208 lbs Today's date: 05/22/2022 Total lbs lost to date: 30 Total lbs lost since last in-office visit: 8  Interim History: Jacqueline Good is down 8 pounds since her last visit.  Subjective:   1. Insulin resistance Jacqueline Good is taking her medication as directed with no side effects.  2. Polyphagia Jacqueline Good is taking phentermine as directed.  3. Vitamin D deficiency Jacqueline Good notes fatigue.  She is taking vitamin D.  4. Other depression, emotional eating Jacqueline Good is taking Topamax with no side effects.  She notes her medication is helping with cravings.  Assessment/Plan:   1. Insulin resistance Jacqueline Good will continue metformin 500 mg twice daily, and we will refill for 1 month.  - metFORMIN (GLUCOPHAGE) 500 MG tablet; Take 1 tablet (500 mg total) by mouth 2 (two) times daily with meals.  Dispense: 60 tablet; Refill: 0  2. Polyphagia Jacqueline Good will continue her medications, and we will refill metformin, phentermine, and Topamax for 1 month.  - metFORMIN (GLUCOPHAGE) 500 MG tablet; Take 1 tablet (500 mg total) by mouth 2 (two) times daily with meals.  Dispense: 60 tablet; Refill: 0 - phentermine 15 MG capsule; Take 1 capsule (15 mg total) by mouth every morning.  Dispense: 30 capsule; Refill: 0 - topiramate (TOPAMAX) 50 MG tablet; Take 1 tablet by mouth daily with supper.  Dispense: 30 tablet; Refill: 0  3. Vitamin D deficiency Jacqueline Good will continue prescription vitamin D 50,000 units once weekly, and we will refill for 1 month.  - Vitamin D, Ergocalciferol,  (DRISDOL) 1.25 MG (50000 UNIT) CAPS capsule; Take 1 capsule by mouth every 7 days.  Dispense: 4 capsule; Refill: 0  4. Other depression, emotional eating Jacqueline Good will continue Topamax 50 mg nightly, and we will refill for 1 month.  - topiramate (TOPAMAX) 50 MG tablet; Take 1 tablet by mouth daily with supper.  Dispense: 30 tablet; Refill: 0  5. Obesity, Current BMI 35.8 Jacqueline Good is currently in the action stage of change. As such, her goal is to continue with weight loss efforts. She has agreed to the Category 1 Plan.   She will adhere closely to the plan 80-90%.  We discussed various medication options to help Jacqueline Good with her weight loss efforts and we both agreed to continue phentermine 15 mg every morning, and we will refill for 1 month.  PDMP was checked.  - phentermine 15 MG capsule; Take 1 capsule (15 mg total) by mouth every morning.  Dispense: 30 capsule; Refill: 0  Exercise goals: As is.   Behavioral modification strategies: increasing lean protein intake, decreasing simple carbohydrates, increasing vegetables, increasing water intake, decreasing eating out, no skipping meals, meal planning and cooking strategies, keeping healthy foods in the home, and planning for success.  Jacqueline Good has agreed to follow-up with our clinic in 4 weeks. She was informed of the importance of frequent follow-up visits to maximize her success with intensive lifestyle modifications for her multiple health conditions.   Objective:   Blood pressure (!) 125/90, pulse 83, temperature 98.9 F (37.2 C), height '5\' 4"'$  (1.626 m), weight  208 lb (94.3 kg), SpO2 97 %. Body mass index is 35.7 kg/m.  General: Cooperative, alert, well developed, in no acute distress. HEENT: Conjunctivae and lids unremarkable. Cardiovascular: Regular rhythm.  Lungs: Normal work of breathing. Neurologic: No focal deficits.   Lab Results  Component Value Date   CREATININE 1.02 (H) 12/26/2021   BUN 23 12/26/2021   NA 141  12/26/2021   K 3.8 12/26/2021   CL 105 12/26/2021   CO2 28 12/26/2021   Lab Results  Component Value Date   ALT 12 12/26/2021   AST 14 (L) 12/26/2021   ALKPHOS 78 12/26/2021   BILITOT 0.7 12/26/2021   Lab Results  Component Value Date   HGBA1C 5.1 11/27/2021   HGBA1C 5.5 03/21/2021   HGBA1C 5.4 04/27/2014   Lab Results  Component Value Date   INSULIN 11.7 11/27/2021   INSULIN 31.4 (H) 03/21/2021   Lab Results  Component Value Date   TSH 2.200 03/21/2021   Lab Results  Component Value Date   CHOL 195 11/27/2021   HDL 44 11/27/2021   LDLCALC 127 (H) 11/27/2021   TRIG 136 11/27/2021   CHOLHDL 3.2 02/18/2019   Lab Results  Component Value Date   VD25OH 33.2 11/27/2021   VD25OH 14.4 (L) 03/21/2021   Lab Results  Component Value Date   WBC 6.4 12/26/2021   HGB 12.9 12/26/2021   HCT 39.6 12/26/2021   MCV 87.4 12/26/2021   PLT 211 12/26/2021   No results found for: "IRON", "TIBC", "FERRITIN"  Attestation Statements:   Reviewed by clinician on day of visit: allergies, medications, problem list, medical history, surgical history, family history, social history, and previous encounter notes.   Wilhemena Durie, am acting as Location manager for [employer], DO.  I have reviewed the above documentation for accuracy and completeness, and I agree with the above. Jearld Lesch, DO

## 2022-05-29 ENCOUNTER — Encounter: Payer: Self-pay | Admitting: Bariatrics

## 2022-06-09 ENCOUNTER — Other Ambulatory Visit: Payer: Medicaid Other

## 2022-06-11 ENCOUNTER — Other Ambulatory Visit (HOSPITAL_COMMUNITY): Payer: Self-pay

## 2022-06-11 DIAGNOSIS — Z79899 Other long term (current) drug therapy: Secondary | ICD-10-CM | POA: Diagnosis not present

## 2022-06-11 DIAGNOSIS — R7309 Other abnormal glucose: Secondary | ICD-10-CM | POA: Diagnosis not present

## 2022-06-11 DIAGNOSIS — M545 Low back pain, unspecified: Secondary | ICD-10-CM | POA: Diagnosis not present

## 2022-06-11 DIAGNOSIS — Z131 Encounter for screening for diabetes mellitus: Secondary | ICD-10-CM | POA: Diagnosis not present

## 2022-06-11 DIAGNOSIS — R030 Elevated blood-pressure reading, without diagnosis of hypertension: Secondary | ICD-10-CM | POA: Diagnosis not present

## 2022-06-11 DIAGNOSIS — G8929 Other chronic pain: Secondary | ICD-10-CM | POA: Diagnosis not present

## 2022-06-11 MED ORDER — OXYCODONE-ACETAMINOPHEN 10-325 MG PO TABS
1.0000 | ORAL_TABLET | Freq: Four times a day (QID) | ORAL | 0 refills | Status: DC | PRN
  Filled 2022-06-11: qty 120, 30d supply, fill #0

## 2022-06-14 NOTE — Progress Notes (Unsigned)
Virtual Visit via Video Note  I connected with Jacqueline Good on 06/16/22 at  1:00 PM EST by a video enabled telemedicine application and verified that I am speaking with the correct person using two identifiers.  Location: Patient: home Provider: office Persons participated in the visit- patient, provider    I discussed the limitations of evaluation and management by telemedicine and the availability of in person appointments. The patient expressed understanding and agreed to proceed.  I discussed the assessment and treatment plan with the patient. The patient was provided an opportunity to ask questions and all were answered. The patient agreed with the plan and demonstrated an understanding of the instructions.   The patient was advised to call back or seek an in-person evaluation if the symptoms worsen or if the condition fails to improve as anticipated.  I provided 15 minutes of non-face-to-face time during this encounter.   Norman Clay, MD    [facility] MD/PA/NP OP Progress Note  06/16/2022 1:36 PM Jacqueline Good  MRN:  [id number]  Chief Complaint:  Chief Complaint  Patient presents with   Depression   Follow-up   HPI:  This is a follow-up appointment for depression and insomnia.  She states that she has been doing the same.  When she was asked about the brother, who was missing at the last visit, she states that he is back again.  She states that she did not ask him what happened.  She states that he was "likely on binge (drug use)."  She reports insomnia due to not feeling safe at home.  Although she denies any concern of imminent danger, violence or concern about his brother, she is concerned about her brother's friends as she does not know what they might do in the future.  She feels "up and down (feel down at times.)"  She states that she has a lot of anxiety, and she tries to make the best of it.  She goes out grocery shopping.  Her mother is doing well.  She does not say any  word when she was advised that Xanax will be reduced.  When she was asked if she heard it, she states that she did, and she cannot say anything about it.  She declined to be seen by a therapist as she does not want to do it, and she does not think she needs it. Provided psychoeducation of how therapy can help her- she does not answer it, but states that she is not interested.  She denies alcohol use or drug use.  She denies SI.  She agrees with the plan as below.   Wt Readings from Last 3 Encounters:  05/22/22 208 lb (94.3 kg)  04/23/22 216 lb (98 kg)  03/19/22 217 lb (98.4 kg)     Daily routine: she denies having routine, doing housed hold chores, visiting her mother  Employment: used to work as Research scientist (physical sciences), last in 2017; although the patient was terminated, it was a good timing for the patient as she was taking care of her cousin, who later moved into nursing home.  Support: Household: by herself. Her mother lives next door Marital status: single  Number of children:1 daughter, who lives in [place]. She has estranged relationship with the father of her daughter.  She grew up in [place].  Her parents got divorced when she was 21-year-old.  Although she did not like them arguing with each other, she reports good relationship with her mother, who raised the patient and  other siblings.   Visit Diagnosis:    ICD-10-CM   1. MDD (major depressive disorder), recurrent episode, moderate (HCC)  F33.1     2. Insomnia, unspecified type  G47.00 zolpidem (AMBIEN) 10 MG tablet    3. Generalized anxiety disorder  F41.1       Past Psychiatric History: Please see initial evaluation for full details. I have reviewed the history. No updates at this time.     Past Medical History:  Past Medical History:  Diagnosis Date   Anemia    Anxiety    Arthritis    "all over" (08/12/2018)   Asthma    Back pain    Benign paroxysmal positional vertigo 10/31/2014   Breast cancer, right breast ([place]) dx'd 02/2018    Chronic bronchitis ([place])    Depression    Family history of breast cancer    Family history of prostate cancer    Family history of uterine cancer    GERD (gastroesophageal reflux disease)    History of blood transfusion    "several; related to low blood" (08/12/2018)   History of radiation therapy 11/15/18- 12/17/18   Right Chest wall, SCV, PAB. 25 fractions.    Lymphedema of right arm    Palpitations    Personal history of chemotherapy    Personal history of radiation therapy     Past Surgical History:  Procedure Laterality Date   AXILLARY LYMPH NODE DISSECTION Right 08/12/2018   Procedure: AXILLARY LYMPH NODE DISSECTION;  Surgeon: Alphonsa Overall, MD;  Location: [place];  Service: General;  Laterality: Right;   BREAST BIOPSY Right 03/2018   BREAST RECONSTRUCTION WITH PLACEMENT OF TISSUE EXPANDER AND FLEX HD (ACELLULAR HYDRATED DERMIS) Right 08/12/2018   Procedure: RIGHT BREAST RECONSTRUCTION WITH PLACEMENT OF TISSUE EXPANDER AND FLEX HD (ACELLULAR HYDRATED DERMIS);  Surgeon: Wallace Going, DO;  Location: [place];  Service: Plastics;  Laterality: Right;   BREAST REDUCTION SURGERY Left 09/28/2019   Procedure: LEFT MAMMARY REDUCTION/MASTOPEXY  (BREAST);  Surgeon: Wallace Going, DO;  Location: [place];  Service: Plastics;  Laterality: Left;   DILATION AND CURETTAGE OF UTERUS     ENDOMETRIAL ABLATION     IR IMAGING GUIDED PORT INSERTION  03/18/2018   MASTECTOMY MODIFIED RADICAL Right 08/12/2018   Procedure: RIGHT MODIFIED RADICAL MASTECTOMY;  Surgeon: Alphonsa Overall, MD;  Location: [place];  Service: General;  Laterality: Right;   MASTECTOMY MODIFIED RADICAL Right 08/12/2018   w/axillary LND   MYOMECTOMY     REDUCTION MAMMAPLASTY Left 2021   REMOVAL OF TISSUE EXPANDER AND PLACEMENT OF IMPLANT Right 10/26/2018   Procedure: Removal of right breast expander;  Surgeon: Wallace Going, DO;  Location: [place];  Service: Plastics;  Laterality: Right;  75 min, please    Family  Psychiatric History: Please see initial evaluation for full details. I have reviewed the history. No updates at this time.     Family History:  Family History  Problem Relation Age of Onset   Lupus Sister    Heart disease Sister    Breast cancer Mother 46   Prostate cancer Paternal Uncle    Diabetes Maternal Grandmother    Heart Problems Maternal Grandmother    Prostate cancer Maternal Grandfather    Prostate cancer Paternal Grandfather    Prostate cancer Other        MGFs brother   Breast cancer Other        Mat great-grandmother's sister   Uterine cancer Maternal Great-grandmother  Prostate cancer Other        PGFs brother    Social History:  Social History   Socioeconomic History   Marital status: Single    Spouse name: Not on file   Number of children: Not on file   Years of education: Not on file   Highest education level: Not on file  Occupational History   Not on file  Tobacco Use   Smoking status: Former    Packs/day: 1.00    Years: 38.00    Total pack years: 38.00    Types: Cigarettes    Quit date: 05/15/2018    Years since quitting: 4.0   Smokeless tobacco: Never  Vaping Use   Vaping Use: Never used  Substance and Sexual Activity   Alcohol use: Yes    Comment: occ   Drug use: No   Sexual activity: Not Currently    Birth control/protection: None  Other Topics Concern   Not on file  Social History Narrative   Not on file   Social Determinants of Health   Financial Resource Strain: Not on file  Food Insecurity: Not on file  Transportation Needs: No Transportation Needs (09/07/2018)   PRAPARE - Transportation    Lack of Transportation (Medical): No    Lack of Transportation (Non-Medical): No  Physical Activity: Not on file  Stress: Not on file  Social Connections: Not on file    Allergies:  Allergies  Allergen Reactions   Gadolinium Derivatives Itching and Cough    Pt began sneezing and coughing as soon as MRI contrast was injected.  Severe nasal congestion. No rash or hives no SOB.    Hydrocodone Hives and Itching    Pt says she can take it with benadryl.    Iodinated Contrast Media Itching    Metabolic Disorder Labs: Lab Results  Component Value Date   HGBA1C 5.1 11/27/2021   No results found for: "PROLACTIN" Lab Results  Component Value Date   CHOL 195 11/27/2021   TRIG 136 11/27/2021   HDL 44 11/27/2021   CHOLHDL 3.2 02/18/2019   VLDL 23 02/18/2019   LDLCALC 127 (H) 11/27/2021   LDLCALC 93 03/21/2021   Lab Results  Component Value Date   TSH 2.200 03/21/2021   TSH 0.58 09/24/2016    Therapeutic Level Labs: No results found for: "LITHIUM" No results found for: "VALPROATE" No results found for: "CBMZ"  Current Medications: Current Outpatient Medications  Medication Sig Dispense Refill   Albuterol Sulfate (PROAIR RESPICLICK) 672 (90 Base) MCG/ACT AEPB Inhale 2 puffs into the lungs every 4 hours as needed 1 each 0   [START ON 06/19/2022] ALPRAZolam (XANAX) 1 MG tablet Take 1 tablet (1 mg total) by mouth 3 (three) times daily as needed for anxiety. 90 tablet 1   buPROPion (WELLBUTRIN SR) 150 MG 12 hr tablet Take 1 tablet by mouth daily. 30 tablet 0   esomeprazole (NEXIUM) 40 MG capsule TAKE 1 CAPSULE (40 MG TOTAL) BY MOUTH DAILY. 30 capsule 2   fluticasone-salmeterol (ADVAIR) 100-50 MCG/ACT AEPB Inhale 1 puff into the lungs 2 (two) times daily. 60 each 4   HYDROcodone-acetaminophen (NORCO/VICODIN) 5-325 MG tablet Take 1 tablet by mouth twice a day as needed 10 tablet 0   HYDROcodone-acetaminophen (NORCO/VICODIN) 5-325 MG tablet Take 1 tablet by mouth twice a day as needed. 10 tablet 0   LATUDA 40 MG TABS tablet Take 1 tablet (40 mg total) by mouth daily with breakfast. 90 tablet 0   loratadine (CLARITIN)  10 MG tablet Take 1 tablet (10 mg total) by mouth daily as needed for allergies. 30 tablet 11   metFORMIN (GLUCOPHAGE) 500 MG tablet Take 1 tablet (500 mg total) by mouth 2 (two) times daily with meals.  60 tablet 0   mirtazapine (REMERON) 30 MG tablet Take 1 tablet (30 mg total) by mouth at bedtime. 90 tablet 1   Multiple Vitamin (MULTIVITAMIN) capsule Take 1 capsule by mouth daily.     naloxone (NARCAN) nasal spray 4 mg/0.1 mL Spray 1 spray into 1 nostril every 3 minutes as needed.   Alternate nostrils with each dose until help arrives 2 each 1   omeprazole (PRILOSEC) 40 MG capsule TAKE 1 CAPSULE BY MOUTH DAILY. 30 capsule 2   oxyCODONE-acetaminophen (PERCOCET) 10-325 MG tablet Take 1 tablet by mouth 3 (three) times daily as needed. 45 tablet 0   oxyCODONE-acetaminophen (PERCOCET) 10-325 MG tablet Take 1 tablet by mouth 4 (four) times daily as needed. 120 tablet 0   phentermine 15 MG capsule Take 1 capsule (15 mg total) by mouth every morning. 30 capsule 0   propranolol (INDERAL) 40 MG tablet Take 40 mg by mouth daily as needed (heart palpitation.).      tiZANidine (ZANAFLEX) 4 MG tablet Take 1 tablet by mouth twice daily as needed. 30 tablet 0   topiramate (TOPAMAX) 50 MG tablet Take 1 tablet by mouth daily with supper. 30 tablet 0   venlafaxine XR (EFFEXOR-XR) 75 MG 24 hr capsule Take 3 capsules (225 mg total) by mouth daily with breakfast. 270 capsule 1   Vitamin D, Ergocalciferol, (DRISDOL) 1.25 MG (50000 UNIT) CAPS capsule Take 1 capsule by mouth every 7 days. 4 capsule 0   [START ON 06/19/2022] zolpidem (AMBIEN) 10 MG tablet Take 1 tablet by mouth at bedtime as needed. 30 tablet 1   No current facility-administered medications for this visit.     Musculoskeletal: Strength & Muscle Tone:  N/A Gait & Station:  N/A Patient leans: N/A  Psychiatric Specialty Exam: Review of Systems  Psychiatric/Behavioral:  Positive for decreased concentration, dysphoric mood and sleep disturbance. Negative for agitation, behavioral problems, confusion, hallucinations, self-injury and suicidal ideas. The patient is nervous/anxious. The patient is not hyperactive.   All other systems reviewed and are  negative.   There were no vitals taken for this visit.There is no height or weight on file to calculate BMI.  General Appearance: Fairly Groomed  Eye Contact:  Good  Speech:  Clear and Coherent  Volume:  Normal  Mood:  Anxious  Affect:  Appropriate, Congruent, and tense  Thought Process:  Coherent  Orientation:  Full (Time, Place, and Person)  Thought Content: Logical   Suicidal Thoughts:  No  Homicidal Thoughts:  No  Memory:  Immediate;   Good  Judgement:  Good  Insight:  Present  Psychomotor Activity:  Normal  Concentration:  Concentration: Good and Attention Span: Good  Recall:  Good  Fund of Knowledge: Good  Language: Good  Akathisia:  No  Handed:  Right  AIMS (if indicated): not done  Assets:  Communication Skills Desire for Improvement  ADL's:  Intact  Cognition: WNL  Sleep:  Fair   Screenings: GAD-7    Personnel officer Visit from 04/22/2018 in [place] Office Visit from 06/03/2017 in [place] Office Visit from 09/24/2016 in [place] Office Visit from 03/27/2016 in [place]  Total GAD-7  Score '11 3 15 16      '$ PHQ2-9    Flowsheet Row Video Visit from 09/23/2021 in [place] Video Visit from 04/09/2021 in [place] Office Visit from 03/21/2021 in [place] Video Visit from 11/07/2020 in [place] Office Visit from 04/22/2018 in [place]  PHQ-2 Total Score '4 6 1 4 5  '$ PHQ-9 Total Score '9 11 4 11 15      '$ Flowsheet Row Video Visit from 01/08/2021 in [place] No Risk        Assessment and Plan:  Jacqueline Good is a 61 y.o. year old female with a history of depression, OCD, estrogen negative T2 pN1, stage IIB invasive ductal carcinoma,  diagnosed 02/25/2018 s/p mastectomy 08/2018, s/p neoadjuvant chemo immunotherapy/on trastzumab, asthma, OA, chronic back pain, who presents for follow up appointment for below.     2. MDD (major depressive disorder), recurrent episode, moderate ([place]) 3. Generalized anxiety disorder She continues to report depressive symptoms and then anxiety, although exam is notable for calmer affect in the context of finding her brother, who was missing at the last visit. Other psychosocial stressors includes conflict with her siblings, unemployment, chronic pain, mother's medical condition.  Will continue current dose of venlafaxine, mirtazapine to target depression and then anxiety.  Will lower the total dose of Xanax to avoid potential risk of respiratory suppression with concomitant use of opioid.  Although she did not comment about this, she verbalized understanding about the plans.  Although she will greatly benefit from CBT, she declined to see any therapist.  She also declined to come in person visit as she does not drive highway.  She verbalized understanding that she may be referred to other local office in the future if any difficulty in connection with video visit.   1. Insomnia, unspecified type Unchanged since the last visit.  Will continue current dose of Ambien to target insomnia.  Although she reports history of snoring, she declined and for evaluation of sleep apnea.    # inattention Unchanged. She has a history of worsening in concentration over the past few years before chemotherapy.  No history of IEP/ADHD.  The etiology is likely multifactorial given her ongoing mood symptoms.  She is not interested in a neuropsychological evaluation.      Plan Continue venlafaxine 225 mg daily Continue latuda 40 mg daily  Continue mirtazapine 30 mg at night  Decrease Xanax 1 mg three times a day (was taking additional 0.5 mg daily) as needed for anxiety    Continue Ambien 10 mg at night as needed for  insomnia Next appointment: 1/5 at 11 AM, video. She declined in person visit as she does not drive highway.    Past trials of medication: sertraline, fluoxetine, lexapro, Buspar, venlafaxine, duloxetine, bupropion, quetiapine, Abilify   The patient demonstrates the following risk factors for suicide: Chronic risk factors for suicide include: psychiatric disorder of depression and medical illness of breast cancer. Acute risk factors for suicide include: unemployment and loss (financial, interpersonal, professional). Protective factors for this patient include: positive social support, responsibility to others (children, family), coping skills and hope for the future. Considering these factors, the overall suicide risk at this point appears to be low. Patient is appropriate for outpatient follow up.  I have utilized the [place] Controlled Substances Reporting System (PMP AWARxE) to confirm adherence regarding the patient's medication. My review reveals  appropriate prescription fills.   This clinician has discussed the side effect associated with medication prescribed during this encounter. Please refer to notes in the previous encounters for more details.     Collaboration of Care: Collaboration of Care: Other reviewed notes in Epic  Patient/Guardian was advised Release of Information must be obtained prior to any record release in order to collaborate their care with an outside provider. Patient/Guardian was advised if they have not already done so to contact the registration department to sign all necessary forms in order for [place] to release information regarding their care.   Consent: Patient/Guardian gives verbal consent for treatment and assignment of benefits for services provided during this visit. Patient/Guardian expressed understanding and agreed to proceed.    Norman Clay, MD 06/16/2022, 1:36 PM

## 2022-06-16 ENCOUNTER — Other Ambulatory Visit (HOSPITAL_COMMUNITY): Payer: Self-pay

## 2022-06-16 ENCOUNTER — Encounter: Payer: Self-pay | Admitting: Psychiatry

## 2022-06-16 ENCOUNTER — Telehealth (INDEPENDENT_AMBULATORY_CARE_PROVIDER_SITE_OTHER): Payer: Medicare Other | Admitting: Psychiatry

## 2022-06-16 DIAGNOSIS — F411 Generalized anxiety disorder: Secondary | ICD-10-CM

## 2022-06-16 DIAGNOSIS — G4700 Insomnia, unspecified: Secondary | ICD-10-CM | POA: Diagnosis not present

## 2022-06-16 DIAGNOSIS — F331 Major depressive disorder, recurrent, moderate: Secondary | ICD-10-CM

## 2022-06-16 MED ORDER — ALPRAZOLAM 1 MG PO TABS
1.0000 mg | ORAL_TABLET | Freq: Three times a day (TID) | ORAL | 1 refills | Status: DC | PRN
  Filled 2022-06-19: qty 90, 30d supply, fill #0
  Filled 2022-07-15: qty 90, 30d supply, fill #1

## 2022-06-16 MED ORDER — ZOLPIDEM TARTRATE 10 MG PO TABS
10.0000 mg | ORAL_TABLET | Freq: Every evening | ORAL | 1 refills | Status: DC | PRN
  Filled 2022-06-16: qty 30, 30d supply, fill #0
  Filled 2022-07-15: qty 30, 30d supply, fill #1

## 2022-06-16 NOTE — Patient Instructions (Signed)
Continue venlafaxine 225 mg daily Continue latuda 40 mg daily  Continue mirtazapine 30 mg at night  Decrease Xanax 1 mg three times a day as needed for anxiety Continue Ambien 10 mg at night as needed for insomnia Next appointment: 1/5 at 11 AM

## 2022-06-18 ENCOUNTER — Other Ambulatory Visit (HOSPITAL_COMMUNITY): Payer: Self-pay

## 2022-06-18 ENCOUNTER — Other Ambulatory Visit: Payer: Self-pay | Admitting: Bariatrics

## 2022-06-18 DIAGNOSIS — F3289 Other specified depressive episodes: Secondary | ICD-10-CM

## 2022-06-18 DIAGNOSIS — R632 Polyphagia: Secondary | ICD-10-CM

## 2022-06-19 ENCOUNTER — Encounter: Payer: Self-pay | Admitting: Bariatrics

## 2022-06-19 ENCOUNTER — Other Ambulatory Visit (HOSPITAL_COMMUNITY): Payer: Self-pay

## 2022-06-19 ENCOUNTER — Ambulatory Visit (INDEPENDENT_AMBULATORY_CARE_PROVIDER_SITE_OTHER): Payer: Medicare Other | Admitting: Bariatrics

## 2022-06-19 VITALS — BP 125/84 | HR 85 | Temp 98.0°F | Ht 64.0 in | Wt 207.0 lb

## 2022-06-19 DIAGNOSIS — R632 Polyphagia: Secondary | ICD-10-CM | POA: Diagnosis not present

## 2022-06-19 DIAGNOSIS — Z6835 Body mass index (BMI) 35.0-35.9, adult: Secondary | ICD-10-CM

## 2022-06-19 DIAGNOSIS — E559 Vitamin D deficiency, unspecified: Secondary | ICD-10-CM

## 2022-06-19 DIAGNOSIS — E669 Obesity, unspecified: Secondary | ICD-10-CM

## 2022-06-19 DIAGNOSIS — F3289 Other specified depressive episodes: Secondary | ICD-10-CM

## 2022-06-19 MED ORDER — VITAMIN D (ERGOCALCIFEROL) 1.25 MG (50000 UNIT) PO CAPS
50000.0000 [IU] | ORAL_CAPSULE | ORAL | 0 refills | Status: DC
  Filled 2022-06-19 – 2022-07-30 (×2): qty 4, 28d supply, fill #0

## 2022-06-19 MED ORDER — PHENTERMINE HCL 15 MG PO CAPS
15.0000 mg | ORAL_CAPSULE | ORAL | 0 refills | Status: DC
  Filled 2022-06-19: qty 30, 30d supply, fill #0

## 2022-06-19 MED ORDER — TOPIRAMATE 50 MG PO TABS
50.0000 mg | ORAL_TABLET | Freq: Every day | ORAL | 0 refills | Status: DC
  Filled 2022-06-19: qty 30, 30d supply, fill #0

## 2022-06-30 ENCOUNTER — Telehealth (INDEPENDENT_AMBULATORY_CARE_PROVIDER_SITE_OTHER): Payer: Self-pay | Admitting: Bariatrics

## 2022-06-30 NOTE — Telephone Encounter (Signed)
Patient is returning your call. Could you call her back?

## 2022-06-30 NOTE — Telephone Encounter (Signed)
Patient would like a call back to ask some question. Patient did not mention her questions

## 2022-06-30 NOTE — Telephone Encounter (Signed)
Left message for patient to return call.

## 2022-07-02 ENCOUNTER — Other Ambulatory Visit: Payer: Self-pay | Admitting: Physician Assistant

## 2022-07-02 ENCOUNTER — Encounter: Payer: Self-pay | Admitting: Bariatrics

## 2022-07-02 DIAGNOSIS — Z1231 Encounter for screening mammogram for malignant neoplasm of breast: Secondary | ICD-10-CM

## 2022-07-02 NOTE — Progress Notes (Signed)
Chief Complaint:   OBESITY Jacqueline Good is here to discuss her progress with her obesity treatment plan along with follow-up of her obesity related diagnoses. Jacqueline Good is on the Category 1 Plan and states she is following her eating plan approximately 50% of the time. Jacqueline Good states she is arm lifts for 3 minutes 3-5 times per week.  Today's visit was #: 18 Starting weight: 238 lbs Starting date: 03/21/2021 Today's weight: 207 lbs Today's date: 06/19/2022 Total lbs lost to date: 31 Total lbs lost since last in-office visit: 1  Interim History: Jacqueline Good is down 1 lbs since her last visit. She has been on vacation for 2 weeks.   Subjective:   1. Vitamin D deficiency Jacqueline Good is taking Vitamin D as directed.   2. Polyphagia Jacqueline Good is taking Topamax and Phentermine. PDMP was checked.   3. Other depression, emotional eating Jacqueline Good notes decrease in cravings.  Assessment/Plan:   1. Vitamin D deficiency Jacqueline Good will continue prescription Vitamin D 50,000 IU every week, and we will refill for 1 month.   - Vitamin D, Ergocalciferol, (DRISDOL) 1.25 MG (50000 UNIT) CAPS capsule; Take 1 capsule by mouth every 7 days.  Dispense: 4 capsule; Refill: 0  2. Polyphagia Jacqueline Good will continue Topamax and phentermine, and we will refill for 1 month.   - topiramate (TOPAMAX) 50 MG tablet; Take 1 tablet by mouth daily with supper.  Dispense: 30 tablet; Refill: 0 - phentermine 15 MG capsule; Take 1 capsule (15 mg total) by mouth every morning.  Dispense: 30 capsule; Refill: 0  3. Other depression, emotional eating We will refill Topamax 50 mg once daily for 1 month.   - topiramate (TOPAMAX) 50 MG tablet; Take 1 tablet by mouth daily with supper.  Dispense: 30 tablet; Refill: 0  4. Obesity, Current BMI 35.6 Jacqueline Good is currently in the action stage of change. As such, her goal is to continue with weight loss efforts. She has agreed to the Category 1 Plan.   Meal planning was discussed. She will  adhere closely to the plan. PDMP was checked.   Jacqueline Good will continue phentermine.   - phentermine 15 MG capsule; Take 1 capsule (15 mg total) by mouth every morning.  Dispense: 30 capsule; Refill: 0  Exercise goals: As is.   Behavioral modification strategies: increasing lean protein intake, decreasing simple carbohydrates, increasing vegetables, increasing water intake, decreasing eating out, no skipping meals, meal planning and cooking strategies, keeping healthy foods in the home, and planning for success.  Jacqueline Good has agreed to follow-up with our clinic in 4 weeks. She was informed of the importance of frequent follow-up visits to maximize her success with intensive lifestyle modifications for her multiple health conditions.   Objective:   Blood pressure 125/84, pulse 85, temperature 98 F (36.7 C), height '5\' 4"'$  (1.626 m), weight 207 lb (93.9 kg), SpO2 97 %. Body mass index is 35.53 kg/m.  General: Cooperative, alert, well developed, in no acute distress. HEENT: Conjunctivae and lids unremarkable. Cardiovascular: Regular rhythm.  Lungs: Normal work of breathing. Neurologic: No focal deficits.   Lab Results  Component Value Date   CREATININE 1.02 (H) 12/26/2021   BUN 23 12/26/2021   NA 141 12/26/2021   K 3.8 12/26/2021   CL 105 12/26/2021   CO2 28 12/26/2021   Lab Results  Component Value Date   ALT 12 12/26/2021   AST 14 (L) 12/26/2021   ALKPHOS 78 12/26/2021   BILITOT 0.7 12/26/2021   Lab Results  Component  Value Date   HGBA1C 5.1 11/27/2021   HGBA1C 5.5 03/21/2021   HGBA1C 5.4 04/27/2014   Lab Results  Component Value Date   INSULIN 11.7 11/27/2021   INSULIN 31.4 (H) 03/21/2021   Lab Results  Component Value Date   TSH 2.200 03/21/2021   Lab Results  Component Value Date   CHOL 195 11/27/2021   HDL 44 11/27/2021   LDLCALC 127 (H) 11/27/2021   TRIG 136 11/27/2021   CHOLHDL 3.2 02/18/2019   Lab Results  Component Value Date   VD25OH 33.2  11/27/2021   VD25OH 14.4 (L) 03/21/2021   Lab Results  Component Value Date   WBC 6.4 12/26/2021   HGB 12.9 12/26/2021   HCT 39.6 12/26/2021   MCV 87.4 12/26/2021   PLT 211 12/26/2021   No results found for: "IRON", "TIBC", "FERRITIN"  Attestation Statements:   Reviewed by clinician on day of visit: allergies, medications, problem list, medical history, surgical history, family history, social history, and previous encounter notes.   Wilhemena Durie, am acting as Location manager for [employer], DO.  I have reviewed the above documentation for accuracy and completeness, and I agree with the above. Jearld Lesch, DO

## 2022-07-14 ENCOUNTER — Other Ambulatory Visit (HOSPITAL_COMMUNITY): Payer: Self-pay

## 2022-07-14 DIAGNOSIS — Z79899 Other long term (current) drug therapy: Secondary | ICD-10-CM | POA: Diagnosis not present

## 2022-07-14 DIAGNOSIS — M545 Low back pain, unspecified: Secondary | ICD-10-CM | POA: Diagnosis not present

## 2022-07-14 DIAGNOSIS — G8929 Other chronic pain: Secondary | ICD-10-CM | POA: Diagnosis not present

## 2022-07-14 DIAGNOSIS — Z131 Encounter for screening for diabetes mellitus: Secondary | ICD-10-CM | POA: Diagnosis not present

## 2022-07-14 MED ORDER — OXYCODONE-ACETAMINOPHEN 10-325 MG PO TABS
1.0000 | ORAL_TABLET | Freq: Four times a day (QID) | ORAL | 0 refills | Status: DC | PRN
  Filled 2022-07-14: qty 60, 15d supply, fill #0

## 2022-07-15 ENCOUNTER — Other Ambulatory Visit (HOSPITAL_COMMUNITY): Payer: Self-pay

## 2022-07-15 DIAGNOSIS — Z79899 Other long term (current) drug therapy: Secondary | ICD-10-CM | POA: Diagnosis not present

## 2022-07-16 ENCOUNTER — Other Ambulatory Visit (HOSPITAL_COMMUNITY): Payer: Self-pay

## 2022-07-17 ENCOUNTER — Ambulatory Visit: Payer: Medicaid Other | Admitting: Bariatrics

## 2022-07-18 ENCOUNTER — Other Ambulatory Visit (HOSPITAL_COMMUNITY): Payer: Self-pay

## 2022-07-24 ENCOUNTER — Encounter: Payer: Self-pay | Admitting: Bariatrics

## 2022-07-24 ENCOUNTER — Ambulatory Visit (INDEPENDENT_AMBULATORY_CARE_PROVIDER_SITE_OTHER): Payer: Medicare Other | Admitting: Bariatrics

## 2022-07-24 VITALS — BP 159/101 | HR 92 | Temp 98.2°F | Ht 64.0 in | Wt 210.0 lb

## 2022-07-24 DIAGNOSIS — F3289 Other specified depressive episodes: Secondary | ICD-10-CM

## 2022-07-24 DIAGNOSIS — R632 Polyphagia: Secondary | ICD-10-CM | POA: Diagnosis not present

## 2022-07-24 DIAGNOSIS — R030 Elevated blood-pressure reading, without diagnosis of hypertension: Secondary | ICD-10-CM | POA: Diagnosis not present

## 2022-07-24 DIAGNOSIS — Z6836 Body mass index (BMI) 36.0-36.9, adult: Secondary | ICD-10-CM

## 2022-07-24 DIAGNOSIS — E669 Obesity, unspecified: Secondary | ICD-10-CM

## 2022-07-24 DIAGNOSIS — E559 Vitamin D deficiency, unspecified: Secondary | ICD-10-CM

## 2022-07-25 ENCOUNTER — Other Ambulatory Visit: Payer: Self-pay | Admitting: Bariatrics

## 2022-07-25 ENCOUNTER — Other Ambulatory Visit (HOSPITAL_COMMUNITY): Payer: Self-pay

## 2022-07-25 DIAGNOSIS — F3289 Other specified depressive episodes: Secondary | ICD-10-CM

## 2022-07-25 DIAGNOSIS — E88819 Insulin resistance, unspecified: Secondary | ICD-10-CM

## 2022-07-25 DIAGNOSIS — R632 Polyphagia: Secondary | ICD-10-CM

## 2022-07-28 ENCOUNTER — Other Ambulatory Visit (HOSPITAL_COMMUNITY): Payer: Self-pay

## 2022-07-28 DIAGNOSIS — R030 Elevated blood-pressure reading, without diagnosis of hypertension: Secondary | ICD-10-CM | POA: Diagnosis not present

## 2022-07-28 DIAGNOSIS — Z79899 Other long term (current) drug therapy: Secondary | ICD-10-CM | POA: Diagnosis not present

## 2022-07-28 DIAGNOSIS — M545 Low back pain, unspecified: Secondary | ICD-10-CM | POA: Diagnosis not present

## 2022-07-28 DIAGNOSIS — G8929 Other chronic pain: Secondary | ICD-10-CM | POA: Diagnosis not present

## 2022-07-28 MED ORDER — OXYCODONE-ACETAMINOPHEN 10-325 MG PO TABS
1.0000 | ORAL_TABLET | Freq: Four times a day (QID) | ORAL | 0 refills | Status: DC | PRN
  Filled 2022-07-28: qty 120, 30d supply, fill #0

## 2022-07-30 ENCOUNTER — Other Ambulatory Visit (HOSPITAL_COMMUNITY): Payer: Self-pay

## 2022-07-30 MED ORDER — PROAIR RESPICLICK 108 (90 BASE) MCG/ACT IN AEPB
2.0000 | INHALATION_SPRAY | RESPIRATORY_TRACT | 1 refills | Status: DC | PRN
  Filled 2022-07-30: qty 1, 17d supply, fill #0

## 2022-08-01 ENCOUNTER — Other Ambulatory Visit (HOSPITAL_COMMUNITY): Payer: Self-pay

## 2022-08-01 ENCOUNTER — Other Ambulatory Visit: Payer: Self-pay | Admitting: Adult Health

## 2022-08-01 MED ORDER — ESOMEPRAZOLE MAGNESIUM 40 MG PO CPDR
40.0000 mg | DELAYED_RELEASE_CAPSULE | Freq: Every day | ORAL | 2 refills | Status: AC
  Filled 2022-08-01: qty 30, 30d supply, fill #0
  Filled 2022-11-13: qty 30, 30d supply, fill #1

## 2022-08-05 ENCOUNTER — Other Ambulatory Visit (HOSPITAL_COMMUNITY): Payer: Self-pay

## 2022-08-05 MED ORDER — PHENTERMINE HCL 15 MG PO CAPS
15.0000 mg | ORAL_CAPSULE | ORAL | 0 refills | Status: DC
  Filled 2022-08-05: qty 30, 30d supply, fill #0

## 2022-08-05 MED ORDER — VITAMIN D (ERGOCALCIFEROL) 1.25 MG (50000 UNIT) PO CAPS
50000.0000 [IU] | ORAL_CAPSULE | ORAL | 0 refills | Status: DC
  Filled 2022-08-05: qty 4, 28d supply, fill #0

## 2022-08-05 MED ORDER — TOPIRAMATE 50 MG PO TABS
50.0000 mg | ORAL_TABLET | Freq: Every day | ORAL | 0 refills | Status: DC
  Filled 2022-08-05: qty 30, 30d supply, fill #0

## 2022-08-05 NOTE — Progress Notes (Signed)
Chief Complaint:   OBESITY Jacqueline Good is here to discuss her progress with her obesity treatment plan along with follow-up of her obesity related diagnoses. Jacqueline Good is on the Category 1 Plan and states she is following her eating plan approximately 70% of the time. Jacqueline Good states she is lifting weights and walking for 3 minutes 3-5 times per week.  Today's visit was #: 14 Starting weight: 238 lbs Starting date: 03/21/2021 Today's weight: 210 lbs Today's date: 07/24/2022 Total lbs lost to date: 28 Total lbs lost since last in-office visit: 0  Interim History: Jacqueline Good is up 3 pounds since her last visit.  She denies any issues.  Subjective:   1. Elevated blood pressure reading Jacqueline Good is on phentermine.  Her blood pressure was 159/101, and repeat reading is 140/89.  2. Vitamin D deficiency Jacqueline Good is taking prescription vitamin D, and she notes minimal sun exposure.  3. Other depression, emotional eating Jacqueline Good is taking Topamax, and she notes her emotional eating is more controlled.  Jacqueline Good took 2 phentermine instead of 1 this morning and her blood pressure is elevated.  Assessment/Plan:   1. Elevated blood pressure reading We will follow-up on her blood pressure at her next visit.  2. Vitamin D deficiency We will refill prescription vitamin D 50,000 IU every week for 1 month.  3. Other depression, emotional eating We will refill Topamax 50 mg once daily for 1 month.  4. Polyphagia We will refill phentermine 15 mg once daily for 1 month. Jacqueline Good is to not take 2 tablets of phentermine at 1 time.  She is to recheck her blood pressure at home.  5. Obesity, Current BMI 36.0 Jacqueline Good is currently in the action stage of change. As such, her goal is to continue with weight loss efforts. She has agreed to change to following a lower carbohydrate, vegetable and lean protein rich diet plan.   Meal planning and mindful eating were discussed.  She may use the air  Jacqueline Good.  Exercise goals: As is.   Behavioral modification strategies: increasing lean protein intake, decreasing simple carbohydrates, increasing vegetables, increasing water intake, decreasing eating out, no skipping meals, meal planning and cooking strategies, keeping healthy foods in the home, and planning for success.  Jacqueline Good has agreed to follow-up with our clinic in 2 to 3 weeks. She was informed of the importance of frequent follow-up visits to maximize her success with intensive lifestyle modifications for her multiple health conditions.   Objective:   Blood pressure (!) 159/101, pulse 92, temperature 98.2 F (36.8 C), height '5\' 4"'$  (1.626 m), weight 210 lb (95.3 kg), SpO2 96 %. Body mass index is 36.05 kg/m.  General: Cooperative, alert, well developed, in no acute distress. HEENT: Conjunctivae and lids unremarkable. Cardiovascular: Regular rhythm.  Lungs: Normal work of breathing. Neurologic: No focal deficits.   Lab Results  Component Value Date   CREATININE 1.02 (H) 12/26/2021   BUN 23 12/26/2021   NA 141 12/26/2021   K 3.8 12/26/2021   CL 105 12/26/2021   CO2 28 12/26/2021   Lab Results  Component Value Date   ALT 12 12/26/2021   AST 14 (L) 12/26/2021   ALKPHOS 78 12/26/2021   BILITOT 0.7 12/26/2021   Lab Results  Component Value Date   HGBA1C 5.1 11/27/2021   HGBA1C 5.5 03/21/2021   HGBA1C 5.4 04/27/2014   Lab Results  Component Value Date   INSULIN 11.7 11/27/2021   INSULIN 31.4 (H) 03/21/2021   Lab Results  Component Value Date   TSH 2.200 03/21/2021   Lab Results  Component Value Date   CHOL 195 11/27/2021   HDL 44 11/27/2021   LDLCALC 127 (H) 11/27/2021   TRIG 136 11/27/2021   CHOLHDL 3.2 02/18/2019   Lab Results  Component Value Date   VD25OH 33.2 11/27/2021   VD25OH 14.4 (L) 03/21/2021   Lab Results  Component Value Date   WBC 6.4 12/26/2021   HGB 12.9 12/26/2021   HCT 39.6 12/26/2021   MCV 87.4 12/26/2021   PLT 211 12/26/2021    No results found for: "IRON", "TIBC", "FERRITIN"  Attestation Statements:   Reviewed by clinician on day of visit: allergies, medications, problem list, medical history, surgical history, family history, social history, and previous encounter notes.   Wilhemena Durie, am acting as Location manager for [employer], DO.  I have reviewed the above documentation for accuracy and completeness, and I agree with the above. Jearld Lesch, DO

## 2022-08-12 ENCOUNTER — Other Ambulatory Visit (HOSPITAL_COMMUNITY): Payer: Self-pay

## 2022-08-12 NOTE — Progress Notes (Signed)
Virtual Visit via Video Note  I connected with Jacqueline Good on 08/15/22 at 11:00 AM EST by a video enabled telemedicine application and verified that I am speaking with the correct person using two identifiers.  Location: Patient: car Provider: office Persons participated in the visit- patient, provider    I discussed the limitations of evaluation and management by telemedicine and the availability of in person appointments. The patient expressed understanding and agreed to proceed.    I discussed the assessment and treatment plan with the patient. The patient was provided an opportunity to ask questions and all were answered. The patient agreed with the plan and demonstrated an understanding of the instructions.   The patient was advised to call back or seek an in-person evaluation if the symptoms worsen or if the condition fails to improve as anticipated.  I provided 21 minutes of non-face-to-face time during this encounter.   Norman Clay, MD    [facility] MD/PA/NP OP Progress Note  08/15/2022 12:00 PM Jacqueline Good  MRN:  [id number]  Chief Complaint:  Chief Complaint  Patient presents with   Follow-up   HPI:  This is a follow-up appointment for depression and anxiety.  She states that she was not doing well.  Her mother was depressed due to anniversary of loss of her own mother on [place] Day.  She also talks about her father, who thinks nobody loves him.  He lives in [place], and she is planning to visit him.  She agrees that she has not much space to take care of herself.  She states that they are always priority when she was introduced the concept of self compassion.  Her brother is not there at her place every day.  She believes he is doing better with addiction.  She states that she always feels depressed, which is normal for her, although it is sad to say. She thinks her mood will be better once she sees her father, and declines to adjust her medication.  She has  occasional insomnia.  She has been doing well with anxiety despite lowering the dose of Xanax.  She denies SI.  She denies alcohol use or drug use.  She has not been able to do much exercise due to back pain.    Daily routine: she denies having routine, doing housed hold chores, visiting her mother  Employment: used to work as Research scientist (physical sciences), last in 2017; although the patient was terminated, it was a good timing for the patient as she was taking care of her cousin, who later moved into nursing home.  Support: Household: by herself. Her mother lives next door Marital status: single  Number of children:1 daughter, who lives in [place]. She has estranged relationship with the father of her daughter.  She grew up in [place].  Her parents got divorced when she was 14-year-old.  Although she did not like them arguing with each other, she reports good relationship with her mother, who raised the patient and other siblings.   Wt Readings from Last 3 Encounters:  07/24/22 210 lb (95.3 kg)  06/19/22 207 lb (93.9 kg)  05/22/22 208 lb (94.3 kg)     Visit Diagnosis:    ICD-10-CM   1. MDD (major depressive disorder), recurrent episode, moderate (HCC)  F33.1     2. Insomnia, unspecified type  G47.00 zolpidem (AMBIEN) 10 MG tablet    3. Generalized anxiety disorder  F41.1       Past Psychiatric History: Please see  initial evaluation for full details. I have reviewed the history. No updates at this time.     Past Medical History:  Past Medical History:  Diagnosis Date   Anemia    Anxiety    Arthritis    "all over" (08/12/2018)   Asthma    Back pain    Benign paroxysmal positional vertigo 10/31/2014   Breast cancer, right breast ([place]) dx'd 02/2018   Chronic bronchitis ([place])    Depression    Family history of breast cancer    Family history of prostate cancer    Family history of uterine cancer    GERD (gastroesophageal reflux disease)    History of blood transfusion    "several; related to low  blood" (08/12/2018)   History of radiation therapy 11/15/18- 12/17/18   Right Chest wall, SCV, PAB. 25 fractions.    Lymphedema of right arm    Palpitations    Personal history of chemotherapy    Personal history of radiation therapy     Past Surgical History:  Procedure Laterality Date   AXILLARY LYMPH NODE DISSECTION Right 08/12/2018   Procedure: AXILLARY LYMPH NODE DISSECTION;  Surgeon: Alphonsa Overall, MD;  Location: [facility];  Service: General;  Laterality: Right;   BREAST BIOPSY Right 03/2018   BREAST RECONSTRUCTION WITH PLACEMENT OF TISSUE EXPANDER AND FLEX HD (ACELLULAR HYDRATED DERMIS) Right 08/12/2018   Procedure: RIGHT BREAST RECONSTRUCTION WITH PLACEMENT OF TISSUE EXPANDER AND FLEX HD (ACELLULAR HYDRATED DERMIS);  Surgeon: Wallace Going, DO;  Location: [place];  Service: Plastics;  Laterality: Right;   BREAST REDUCTION SURGERY Left 09/28/2019   Procedure: LEFT MAMMARY REDUCTION/MASTOPEXY  (BREAST);  Surgeon: Wallace Going, DO;  Location: [facility];  Service: Plastics;  Laterality: Left;   DILATION AND CURETTAGE OF UTERUS     ENDOMETRIAL ABLATION     IR IMAGING GUIDED PORT INSERTION  03/18/2018   MASTECTOMY MODIFIED RADICAL Right 08/12/2018   Procedure: RIGHT MODIFIED RADICAL MASTECTOMY;  Surgeon: Alphonsa Overall, MD;  Location: [facility];  Service: General;  Laterality: Right;   MASTECTOMY MODIFIED RADICAL Right 08/12/2018   w/axillary LND   MYOMECTOMY     REDUCTION MAMMAPLASTY Left 2021   REMOVAL OF TISSUE EXPANDER AND PLACEMENT OF IMPLANT Right 10/26/2018   Procedure: Removal of right breast expander;  Surgeon: Wallace Going, DO;  Location: [facility];  Service: Plastics;  Laterality: Right;  75 min, please    Family Psychiatric History: Please see initial evaluation for full details. I have reviewed the history. No updates at this time.     Family History:  Family History  Problem Relation Age of Onset   Lupus Sister    Heart disease Sister    Breast cancer  Mother 99   Prostate cancer Paternal Uncle    Diabetes Maternal Grandmother    Heart Problems Maternal Grandmother    Prostate cancer Maternal Grandfather    Prostate cancer Paternal Grandfather    Prostate cancer Other        MGFs brother   Breast cancer Other        Mat great-grandmother's sister   Uterine cancer Maternal Great-grandmother    Prostate cancer Other        PGFs brother    Social History:  Social History   Socioeconomic History   Marital status: Single    Spouse name: Not on file   Number of children: Not on file   Years of education: Not on file   Highest  education level: Not on file  Occupational History   Not on file  Tobacco Use   Smoking status: Former    Packs/day: 1.00    Years: 38.00    Total pack years: 38.00    Types: Cigarettes    Quit date: 05/15/2018    Years since quitting: 4.2   Smokeless tobacco: Never  Vaping Use   Vaping Use: Never used  Substance and Sexual Activity   Alcohol use: Yes    Comment: occ   Drug use: No   Sexual activity: Not Currently    Birth control/protection: None  Other Topics Concern   Not on file  Social History Narrative   Not on file   Social Determinants of Health   Financial Resource Strain: Not on file  Food Insecurity: Not on file  Transportation Needs: No Transportation Needs (09/07/2018)   PRAPARE - Transportation    Lack of Transportation (Medical): No    Lack of Transportation (Non-Medical): No  Physical Activity: Not on file  Stress: Not on file  Social Connections: Not on file    Allergies:  Allergies  Allergen Reactions   Gadolinium Derivatives Itching and Cough    Pt began sneezing and coughing as soon as MRI contrast was injected. Severe nasal congestion. No rash or hives no SOB.    Hydrocodone Hives and Itching    Pt says she can take it with benadryl.    Iodinated Contrast Media Itching    Metabolic Disorder Labs: Lab Results  Component Value Date   HGBA1C 5.1 11/27/2021    No results found for: "PROLACTIN" Lab Results  Component Value Date   CHOL 195 11/27/2021   TRIG 136 11/27/2021   HDL 44 11/27/2021   CHOLHDL 3.2 02/18/2019   VLDL 23 02/18/2019   LDLCALC 127 (H) 11/27/2021   LDLCALC 93 03/21/2021   Lab Results  Component Value Date   TSH 2.200 03/21/2021   TSH 0.58 09/24/2016    Therapeutic Level Labs: No results found for: "LITHIUM" No results found for: "VALPROATE" No results found for: "CBMZ"  Current Medications: Current Outpatient Medications  Medication Sig Dispense Refill   Albuterol Sulfate (PROAIR RESPICLICK) 573 (90 Base) MCG/ACT AEPB Inhale 2 puffs into the lungs every 4 (four) hours as needed. 1 each 1   ALPRAZolam (XANAX) 1 MG tablet Take 1 tablet (1 mg total) by mouth 3 (three) times daily as needed for anxiety. 90 tablet 2   buPROPion (WELLBUTRIN SR) 150 MG 12 hr tablet Take 1 tablet by mouth daily. 30 tablet 0   esomeprazole (NEXIUM) 40 MG capsule TAKE 1 CAPSULE (40 MG TOTAL) BY MOUTH DAILY. 30 capsule 2   esomeprazole (NEXIUM) 40 MG capsule Take 1 capsule (40 mg total) by mouth daily. 30 capsule 2   fluticasone-salmeterol (ADVAIR) 100-50 MCG/ACT AEPB Inhale 1 puff into the lungs 2 (two) times daily. 60 each 4   HYDROcodone-acetaminophen (NORCO/VICODIN) 5-325 MG tablet Take 1 tablet by mouth twice a day as needed 10 tablet 0   HYDROcodone-acetaminophen (NORCO/VICODIN) 5-325 MG tablet Take 1 tablet by mouth twice a day as needed. 10 tablet 0   [START ON 08/25/2022] LATUDA 40 MG TABS tablet Take 1 tablet (40 mg total) by mouth daily with breakfast. 90 tablet 0   loratadine (CLARITIN) 10 MG tablet Take 1 tablet (10 mg total) by mouth daily as needed for allergies. 30 tablet 11   metFORMIN (GLUCOPHAGE) 500 MG tablet Take 1 tablet (500 mg total) by mouth 2 (two)  times daily with meals. 60 tablet 0   mirtazapine (REMERON) 30 MG tablet Take 1 tablet (30 mg total) by mouth at bedtime. 90 tablet 1   Multiple Vitamin (MULTIVITAMIN)  capsule Take 1 capsule by mouth daily.     naloxone (NARCAN) nasal spray 4 mg/0.1 mL Spray 1 spray into 1 nostril every 3 minutes as needed.   Alternate nostrils with each dose until help arrives 2 each 1   omeprazole (PRILOSEC) 40 MG capsule TAKE 1 CAPSULE BY MOUTH DAILY. 30 capsule 2   oxyCODONE-acetaminophen (PERCOCET) 10-325 MG tablet Take 1 tablet by mouth 3 (three) times daily as needed. 45 tablet 0   oxyCODONE-acetaminophen (PERCOCET) 10-325 MG tablet Take 1 tablet by mouth 4 (four) times daily as needed. 120 tablet 0   phentermine 15 MG capsule Take 1 capsule (15 mg total) by mouth every morning. 30 capsule 0   propranolol (INDERAL) 40 MG tablet Take 40 mg by mouth daily as needed (heart palpitation.).      tiZANidine (ZANAFLEX) 4 MG tablet Take 1 tablet by mouth twice daily as needed. 30 tablet 0   topiramate (TOPAMAX) 50 MG tablet Take 1 tablet by mouth daily with supper. 30 tablet 0   venlafaxine XR (EFFEXOR-XR) 75 MG 24 hr capsule Take 3 capsules (225 mg total) by mouth daily with breakfast. 270 capsule 1   Vitamin D, Ergocalciferol, (DRISDOL) 1.25 MG (50000 UNIT) CAPS capsule Take 1 capsule by mouth every 7 days. 4 capsule 0   [START ON 08/18/2022] zolpidem (AMBIEN) 10 MG tablet Take 1 tablet (10 mg total) by mouth at bedtime as needed. 30 tablet 2   No current facility-administered medications for this visit.     Musculoskeletal: Strength & Muscle Tone:  N/A Gait & Station:  N/A Patient leans: N/A  Psychiatric Specialty Exam: Review of Systems  Psychiatric/Behavioral:  Positive for dysphoric mood and sleep disturbance. Negative for agitation, behavioral problems, confusion, decreased concentration, hallucinations, self-injury and suicidal ideas. The patient is nervous/anxious. The patient is not hyperactive.   All other systems reviewed and are negative.   There were no vitals taken for this visit.There is no height or weight on file to calculate BMI.  General Appearance:  Fairly Groomed  Eye Contact:  Good  Speech:  Clear and Coherent  Volume:  Normal  Mood:  Depressed  Affect:  Appropriate, Congruent, and Tearful  Thought Process:  Coherent  Orientation:  Full (Time, Place, and Person)  Thought Content: Logical   Suicidal Thoughts:  No  Homicidal Thoughts:  No  Memory:  Immediate;   Good  Judgement:  Good  Insight:  Good  Psychomotor Activity:  Normal  Concentration:  Concentration: Good and Attention Span: Good  Recall:  Good  Fund of Knowledge: Good  Language: Good  Akathisia:  No  Handed:  Right  AIMS (if indicated): not done  Assets:  Communication Skills Desire for Improvement  ADL's:  Intact  Cognition: WNL  Sleep:  Poor   Screenings: GAD-7    Flowsheet Row Office Visit from 04/22/2018 in [facility] Office Visit from 06/03/2017 in [facility] Office Visit from 09/24/2016 in [facility] Office Visit from 03/27/2016 in [facility]  Total GAD-7 Score '11 3 15 16      '$ PHQ2-9    Flowsheet Row Video Visit from 09/23/2021 in [facility] Video Visit from 04/09/2021 in [facility] Office Visit from 03/21/2021 in [facility] Video Visit from 11/07/2020 in [facility] Office Visit from 04/22/2018 in [facility]  PHQ-2 Total Score '4 6 1 4 5  '$ PHQ-9 Total Score '9 11 4 11 15      '$ Flowsheet Row Video Visit from 01/08/2021 in [facility] No Risk        Assessment and Plan:  Jacqueline Good is a 62 y.o. year old female with a history of depression, OCD, estrogen negative T2 pN1, stage IIB invasive ductal carcinoma, diagnosed 02/25/2018 s/p mastectomy 08/2018, s/p neoadjuvant chemo immunotherapy/on trastzumab, asthma, OA, chronic back pain, who presents  for follow up appointment for below.    2. MDD (major depressive disorder), recurrent episode, moderate ([place]) 3. Generalized anxiety disorder She continues to report depressive symptoms and anxiety since the last visit.  Psychosocial stressors includes her mother, who is in grief due to anniversary of loss of her mother, and father in [place], who is struggling with loneliness. Other psychosocial stressors includes conflict with her siblings, unemployment, chronic pain, mother's medical condition.  Although she may benefit from adjustment of her medications such as lithium, she does not want to try any medication adjustment at this time.  Will continue current dose of venlafaxine, mirtazapine to target depression and anxiety.  Will continue Latuda as adjunctive treatment for depression.  She has been successfully able to taper down use of Xanax.  Will continue the current dose at this time with the hope to taper it off in the future.  Although she will greatly benefit from CBT, she declined to see any therapist.  She also declined to come in person visit as she does not drive highway.   1. Insomnia, unspecified type Slightly worsening.  Will continue current dose of Ambien to target insomnia. Although she reports history of snoring, she declined and for evaluation of sleep apnea.   # inattention Unchanged. She has a history of worsening in concentration over the past few years before chemotherapy.  No history of IEP/ADHD.  The etiology is likely multifactorial given her ongoing mood symptoms.  She is not interested in a neuropsychological evaluation.      Plan Continue venlafaxine 225 mg daily Continue latuda 40 mg daily  Continue mirtazapine 30 mg at night  Continue Xanax 1 mg three times a day as needed for anxiety    Continue Ambien 10 mg at night as needed for insomnia Next appointment: 3/29 at 11 am, video. She declined in person visit as she does not drive highway. She asks to have  less visit, although she is amenable to contact the clinic for sooner appointment if any worsening in her mood. - on oxycodone 10-325 qid   Past trials of medication: sertraline, fluoxetine, lexapro, Buspar, venlafaxine, duloxetine, bupropion, quetiapine, Abilify   The patient demonstrates the following risk factors for suicide: Chronic risk factors for suicide include: psychiatric disorder of depression and medical illness of breast cancer. Acute risk factors for suicide include: unemployment and loss (financial, interpersonal, professional). Protective factors for this patient include: positive social support, responsibility to others (children, family), coping skills and hope for the future. Considering these factors, the overall suicide risk at this point appears to be low. Patient is appropriate for outpatient follow up.     Collaboration of Care: Collaboration of Care: Other reviewed notes in Epic  Patient/Guardian was advised Release of Information must be  obtained prior to any record release in order to collaborate their care with an outside provider. Patient/Guardian was advised if they have not already done so to contact the registration department to sign all necessary forms in order for [place] to release information regarding their care.   Consent: Patient/Guardian gives verbal consent for treatment and assignment of benefits for services provided during this visit. Patient/Guardian expressed understanding and agreed to proceed.    Norman Clay, MD 08/15/2022, 12:00 PM

## 2022-08-13 ENCOUNTER — Telehealth: Payer: Self-pay

## 2022-08-13 ENCOUNTER — Other Ambulatory Visit (HOSPITAL_COMMUNITY): Payer: Self-pay

## 2022-08-13 NOTE — Telephone Encounter (Signed)
received notice on the web from [email or web address] that the vraylar 1.'5mg'$  was approved. pa # [id number] is approved to 08-11-23

## 2022-08-14 ENCOUNTER — Ambulatory Visit: Payer: Medicare Other | Admitting: Bariatrics

## 2022-08-15 ENCOUNTER — Telehealth (INDEPENDENT_AMBULATORY_CARE_PROVIDER_SITE_OTHER): Payer: Medicare Other | Admitting: Psychiatry

## 2022-08-15 ENCOUNTER — Encounter: Payer: Self-pay | Admitting: Psychiatry

## 2022-08-15 ENCOUNTER — Other Ambulatory Visit (HOSPITAL_COMMUNITY): Payer: Self-pay

## 2022-08-15 DIAGNOSIS — F411 Generalized anxiety disorder: Secondary | ICD-10-CM

## 2022-08-15 DIAGNOSIS — F331 Major depressive disorder, recurrent, moderate: Secondary | ICD-10-CM

## 2022-08-15 DIAGNOSIS — G4700 Insomnia, unspecified: Secondary | ICD-10-CM | POA: Diagnosis not present

## 2022-08-15 MED ORDER — LATUDA 40 MG PO TABS
40.0000 mg | ORAL_TABLET | Freq: Every day | ORAL | 0 refills | Status: DC
  Filled 2022-08-15: qty 90, 90d supply, fill #0
  Filled 2022-09-12: qty 30, 30d supply, fill #0
  Filled 2022-09-18 (×2): qty 90, 90d supply, fill #0

## 2022-08-15 MED ORDER — ALPRAZOLAM 1 MG PO TABS
1.0000 mg | ORAL_TABLET | Freq: Three times a day (TID) | ORAL | 2 refills | Status: DC | PRN
  Filled 2022-08-15: qty 90, 30d supply, fill #0
  Filled 2022-09-12: qty 90, 30d supply, fill #1
  Filled 2022-10-14: qty 90, 30d supply, fill #2

## 2022-08-15 MED ORDER — ZOLPIDEM TARTRATE 10 MG PO TABS
10.0000 mg | ORAL_TABLET | Freq: Every evening | ORAL | 2 refills | Status: DC | PRN
  Filled 2022-08-15: qty 30, 30d supply, fill #0
  Filled 2022-09-12: qty 30, 30d supply, fill #1
  Filled 2022-10-14: qty 30, 30d supply, fill #2

## 2022-08-15 NOTE — Patient Instructions (Signed)
Continue venlafaxine 225 mg daily Continue latuda 40 mg daily  Continue mirtazapine 30 mg at night  Decrease Xanax 1 mg three times a day as needed for anxiety    Continue Ambien 10 mg at night as needed for insomnia Next appointment: 3/29 at 11 am,

## 2022-08-16 ENCOUNTER — Other Ambulatory Visit (HOSPITAL_COMMUNITY): Payer: Self-pay

## 2022-08-18 ENCOUNTER — Other Ambulatory Visit (HOSPITAL_COMMUNITY): Payer: Self-pay

## 2022-08-21 ENCOUNTER — Other Ambulatory Visit (HOSPITAL_COMMUNITY): Payer: Self-pay

## 2022-08-21 ENCOUNTER — Encounter: Payer: Self-pay | Admitting: Bariatrics

## 2022-08-21 ENCOUNTER — Ambulatory Visit (INDEPENDENT_AMBULATORY_CARE_PROVIDER_SITE_OTHER): Payer: 59 | Admitting: Bariatrics

## 2022-08-21 VITALS — BP 141/91 | HR 81 | Temp 98.0°F | Ht 64.0 in | Wt 209.0 lb

## 2022-08-21 DIAGNOSIS — E669 Obesity, unspecified: Secondary | ICD-10-CM

## 2022-08-21 DIAGNOSIS — I10 Essential (primary) hypertension: Secondary | ICD-10-CM | POA: Diagnosis not present

## 2022-08-21 DIAGNOSIS — R632 Polyphagia: Secondary | ICD-10-CM | POA: Diagnosis not present

## 2022-08-21 DIAGNOSIS — E88819 Insulin resistance, unspecified: Secondary | ICD-10-CM

## 2022-08-21 DIAGNOSIS — Z6835 Body mass index (BMI) 35.0-35.9, adult: Secondary | ICD-10-CM

## 2022-08-21 DIAGNOSIS — F3289 Other specified depressive episodes: Secondary | ICD-10-CM | POA: Diagnosis not present

## 2022-08-21 DIAGNOSIS — E559 Vitamin D deficiency, unspecified: Secondary | ICD-10-CM

## 2022-08-21 MED ORDER — VITAMIN D (ERGOCALCIFEROL) 1.25 MG (50000 UNIT) PO CAPS
50000.0000 [IU] | ORAL_CAPSULE | ORAL | 0 refills | Status: DC
  Filled 2022-08-21 – 2022-10-05 (×2): qty 5, 35d supply, fill #0

## 2022-08-21 MED ORDER — TOPIRAMATE 50 MG PO TABS
50.0000 mg | ORAL_TABLET | Freq: Every day | ORAL | 0 refills | Status: DC
  Filled 2022-08-21: qty 30, 30d supply, fill #0

## 2022-08-21 MED ORDER — WEGOVY 0.25 MG/0.5ML ~~LOC~~ SOAJ
0.2500 mg | SUBCUTANEOUS | 0 refills | Status: DC
  Filled 2022-08-21 – 2022-09-30 (×3): qty 2, 28d supply, fill #0

## 2022-08-21 MED ORDER — CHLORTHALIDONE 25 MG PO TABS
25.0000 mg | ORAL_TABLET | Freq: Every day | ORAL | 0 refills | Status: DC
  Filled 2022-08-21: qty 30, 30d supply, fill #0

## 2022-08-22 ENCOUNTER — Other Ambulatory Visit (HOSPITAL_COMMUNITY): Payer: Self-pay

## 2022-08-25 ENCOUNTER — Other Ambulatory Visit (HOSPITAL_COMMUNITY): Payer: Self-pay

## 2022-08-27 ENCOUNTER — Other Ambulatory Visit (HOSPITAL_COMMUNITY): Payer: Self-pay

## 2022-08-28 ENCOUNTER — Other Ambulatory Visit (HOSPITAL_COMMUNITY): Payer: Self-pay

## 2022-08-28 DIAGNOSIS — G8929 Other chronic pain: Secondary | ICD-10-CM | POA: Diagnosis not present

## 2022-08-28 DIAGNOSIS — Z79899 Other long term (current) drug therapy: Secondary | ICD-10-CM | POA: Diagnosis not present

## 2022-08-28 DIAGNOSIS — M545 Low back pain, unspecified: Secondary | ICD-10-CM | POA: Diagnosis not present

## 2022-08-28 MED ORDER — OXYCODONE-ACETAMINOPHEN 10-325 MG PO TABS
1.0000 | ORAL_TABLET | Freq: Four times a day (QID) | ORAL | 0 refills | Status: DC | PRN
  Filled 2022-08-28: qty 120, 30d supply, fill #0

## 2022-08-29 ENCOUNTER — Ambulatory Visit
Admission: RE | Admit: 2022-08-29 | Discharge: 2022-08-29 | Disposition: A | Discharge status: Home / Self Care | Payer: 59 | Source: Ambulatory Visit | Attending: Physician Assistant | Admitting: Physician Assistant

## 2022-08-29 ENCOUNTER — Ambulatory Visit: Payer: 59

## 2022-08-29 DIAGNOSIS — Z1231 Encounter for screening mammogram for malignant neoplasm of breast: Secondary | ICD-10-CM | POA: Diagnosis not present

## 2022-09-01 DIAGNOSIS — Z79899 Other long term (current) drug therapy: Secondary | ICD-10-CM | POA: Diagnosis not present

## 2022-09-01 NOTE — Progress Notes (Signed)
Chief Complaint:   OBESITY Jacqueline Good is here to discuss her progress with her obesity treatment plan along with follow-up of her obesity related diagnoses. Jacqueline Good is on following a lower carbohydrate, vegetable and lean protein rich diet plan and states she is following her eating plan approximately 25% of the time. Jacqueline Good states she is doing back exercises and stretches 5 minutes 2-3 times per week.  Today's visit was #: 20 Starting weight: 238 lbs Starting date: 03/21/2021 Today's weight: 209 lbs Today's date: 08/21/2022 Total lbs lost to date: 29 Total lbs lost since last in-office visit: 1  Interim History: Jacqueline Good is down 1 lb since her last visit.  Subjective:   1. Vitamin D deficiency Envy is taking prescription Vitamin D.  2. Other depression, emotional eating Jacqueline Good is taking Topamax and notes emotional eating is controlled.  Jacqueline Good will discontinue phentermine due to elevated BP.   4. Essential hypertension Jacqueline Good has had several high BP readings.  Assessment/Plan:   1. Vitamin D deficiency We will refill Vitamin D 50,000 IU for 1 month.   Refill- Vitamin D, Ergocalciferol, (DRISDOL) 1.25 MG (50000 UNIT) CAPS capsule; Take 1 capsule by mouth every 7 days.  Dispense: 5 capsule; Refill: 0  2. Other depression, emotional eating Continue current treatment plan.  Refill- topiramate (TOPAMAX) 50 MG tablet; Take 1 tablet by mouth daily with supper.  Dispense: 30 tablet; Refill: 0  3. Polyphagia Discontinue phentermine. Start Wegovy- no contraindications.   Refill- topiramate (TOPAMAX) 50 MG tablet; Take 1 tablet by mouth daily with supper.  Dispense: 30 tablet; Refill: 0 Start- Semaglutide-Weight Management (WEGOVY) 0.25 MG/0.5ML SOAJ; Inject 0.25 mg into the skin once a week.  Dispense: 2 mL; Refill: 0  4. Essential hypertension Jacqueline Good will start [place] 25 mg daily.  Start- chlorthalidone (HYGROTON) 25 MG tablet; Take 1 tablet (25 mg  total) by mouth daily.  Dispense: 30 tablet; Refill: 0  5. Obesity, Current BMI 35.9 Jacqueline Good is currently in the action stage of change. As such, her goal is to continue with weight loss efforts. She has agreed to the Category 1 Plan.   Meal planning. Will adhere closely to the plan.  Exercise goals:  As is  Behavioral modification strategies: increasing lean protein intake, decreasing simple carbohydrates, increasing vegetables, increasing water intake, decreasing eating out, no skipping meals, meal planning and cooking strategies, keeping healthy foods in the home, and planning for success.  Jacqueline Good has agreed to follow-up with our clinic in 2-3 weeks. She was informed of the importance of frequent follow-up visits to maximize her success with intensive lifestyle modifications for her multiple health conditions.   Objective:   Blood pressure (!) 141/91, pulse 81, temperature 98 F (36.7 C), height '5\' 4"'$  (1.626 m), weight 209 lb (94.8 kg), SpO2 97 %. Body mass index is 35.87 kg/m.  General: Cooperative, alert, well developed, in no acute distress. HEENT: Conjunctivae and lids unremarkable. Cardiovascular: Regular rhythm.  Lungs: Normal work of breathing. Neurologic: No focal deficits.   Lab Results  Component Value Date   CREATININE 1.02 (H) 12/26/2021   BUN 23 12/26/2021   NA 141 12/26/2021   K 3.8 12/26/2021   CL 105 12/26/2021   CO2 28 12/26/2021   Lab Results  Component Value Date   ALT 12 12/26/2021   AST 14 (L) 12/26/2021   ALKPHOS 78 12/26/2021   BILITOT 0.7 12/26/2021   Lab Results  Component Value Date   HGBA1C 5.1 11/27/2021  HGBA1C 5.5 03/21/2021   HGBA1C 5.4 04/27/2014   Lab Results  Component Value Date   INSULIN 11.7 11/27/2021   INSULIN 31.4 (H) 03/21/2021   Lab Results  Component Value Date   TSH 2.200 03/21/2021   Lab Results  Component Value Date   CHOL 195 11/27/2021   HDL 44 11/27/2021   LDLCALC 127 (H) 11/27/2021   TRIG 136  11/27/2021   CHOLHDL 3.2 02/18/2019   Lab Results  Component Value Date   VD25OH 33.2 11/27/2021   VD25OH 14.4 (L) 03/21/2021   Lab Results  Component Value Date   WBC 6.4 12/26/2021   HGB 12.9 12/26/2021   HCT 39.6 12/26/2021   MCV 87.4 12/26/2021   PLT 211 12/26/2021    Attestation Statements:   Reviewed by clinician on day of visit: allergies, medications, problem list, medical history, surgical history, family history, social history, and previous encounter notes.  I, Kathlene November, BS, CMA, am acting as transcriptionist for [employer], DO.  I have reviewed the above documentation for accuracy and completeness, and I agree with the above. Jearld Lesch, DO

## 2022-09-02 ENCOUNTER — Encounter: Payer: Self-pay | Admitting: Bariatrics

## 2022-09-04 ENCOUNTER — Other Ambulatory Visit (HOSPITAL_COMMUNITY): Payer: Self-pay

## 2022-09-04 DIAGNOSIS — R519 Headache, unspecified: Secondary | ICD-10-CM | POA: Diagnosis not present

## 2022-09-05 ENCOUNTER — Other Ambulatory Visit: Payer: Self-pay | Admitting: Physician Assistant

## 2022-09-05 DIAGNOSIS — R519 Headache, unspecified: Secondary | ICD-10-CM

## 2022-09-08 ENCOUNTER — Other Ambulatory Visit (HOSPITAL_COMMUNITY): Payer: Self-pay

## 2022-09-08 MED ORDER — POTASSIUM CHLORIDE CRYS ER 10 MEQ PO TBCR
EXTENDED_RELEASE_TABLET | ORAL | 0 refills | Status: AC
  Filled 2022-09-08: qty 10, 10d supply, fill #0

## 2022-09-09 ENCOUNTER — Other Ambulatory Visit (HOSPITAL_COMMUNITY): Payer: Self-pay

## 2022-09-10 ENCOUNTER — Other Ambulatory Visit: Payer: Self-pay

## 2022-09-10 ENCOUNTER — Encounter: Payer: Self-pay | Admitting: Neurology

## 2022-09-12 ENCOUNTER — Other Ambulatory Visit: Payer: Self-pay

## 2022-09-12 ENCOUNTER — Other Ambulatory Visit (HOSPITAL_COMMUNITY): Payer: Self-pay

## 2022-09-15 ENCOUNTER — Ambulatory Visit: Payer: 59 | Admitting: Neurology

## 2022-09-15 ENCOUNTER — Telehealth: Payer: Self-pay

## 2022-09-15 NOTE — Telephone Encounter (Signed)
Medication management - Prior authorization for patient's prescribed Latuda 40 mg, one a day completed online with CoverMyMeds and sent to [employer] for pending approval.

## 2022-09-17 ENCOUNTER — Other Ambulatory Visit (HOSPITAL_COMMUNITY): Payer: Self-pay

## 2022-09-17 NOTE — Telephone Encounter (Signed)
Could you find out why it is not approved? She has been on this for the past few years.

## 2022-09-17 NOTE — Telephone Encounter (Signed)
the latuda '40mg'$  was not approved.

## 2022-09-18 ENCOUNTER — Other Ambulatory Visit: Payer: Self-pay

## 2022-09-18 ENCOUNTER — Other Ambulatory Visit (HOSPITAL_COMMUNITY): Payer: Self-pay

## 2022-09-18 ENCOUNTER — Other Ambulatory Visit (HOSPITAL_BASED_OUTPATIENT_CLINIC_OR_DEPARTMENT_OTHER): Payer: Self-pay

## 2022-09-18 NOTE — Telephone Encounter (Signed)
had to submit a appeal- pending.

## 2022-09-22 ENCOUNTER — Other Ambulatory Visit (HOSPITAL_COMMUNITY): Payer: Self-pay

## 2022-09-22 ENCOUNTER — Telehealth: Payer: Self-pay | Admitting: Psychiatry

## 2022-09-22 NOTE — Telephone Encounter (Signed)
Received a letter from [employer], denial of latuda.  She needs to try 3 of the covered drugs: asenapine, caplyta, fanapt, olanzapine, paliperidone, quetiapine, rexulti, risperidone, vraylar, ziprasidone.

## 2022-09-22 NOTE — Telephone Encounter (Signed)
left message with the information and asked if she would please call the office back.

## 2022-09-22 NOTE — Telephone Encounter (Signed)
Could you please reach out to the patient? Jacqueline Good has been denied by [employer], and she needs to try another medication first. I would recommend Geodon at this time, as it has a lower risk of weight gain compared to other options on the list they provided. However, before starting this medication, she will need to obtain an EKG for safety reasons. Could you inquire if she can ask her primary care provider to perform this test? Although I'm willing to arrange for it to be done at [facility], she has previously declined to come to [place]. Thanks.

## 2022-09-25 ENCOUNTER — Other Ambulatory Visit (HOSPITAL_COMMUNITY): Payer: Self-pay

## 2022-09-25 ENCOUNTER — Ambulatory Visit: Payer: 59 | Admitting: Bariatrics

## 2022-09-25 ENCOUNTER — Other Ambulatory Visit: Payer: Self-pay

## 2022-09-26 ENCOUNTER — Other Ambulatory Visit (HOSPITAL_COMMUNITY): Payer: Self-pay

## 2022-09-26 DIAGNOSIS — Z131 Encounter for screening for diabetes mellitus: Secondary | ICD-10-CM | POA: Diagnosis not present

## 2022-09-26 DIAGNOSIS — Z79899 Other long term (current) drug therapy: Secondary | ICD-10-CM | POA: Diagnosis not present

## 2022-09-26 DIAGNOSIS — G8929 Other chronic pain: Secondary | ICD-10-CM | POA: Diagnosis not present

## 2022-09-26 DIAGNOSIS — M545 Low back pain, unspecified: Secondary | ICD-10-CM | POA: Diagnosis not present

## 2022-09-26 MED ORDER — OXYCODONE-ACETAMINOPHEN 10-325 MG PO TABS
1.0000 | ORAL_TABLET | Freq: Four times a day (QID) | ORAL | 0 refills | Status: DC | PRN
  Filled 2022-09-26: qty 120, 30d supply, fill #0

## 2022-09-27 ENCOUNTER — Other Ambulatory Visit: Payer: 59

## 2022-09-29 ENCOUNTER — Ambulatory Visit
Admission: RE | Admit: 2022-09-29 | Discharge: 2022-09-29 | Disposition: A | Discharge status: Home / Self Care | Payer: 59 | Source: Ambulatory Visit | Attending: Physician Assistant | Admitting: Physician Assistant

## 2022-09-29 ENCOUNTER — Encounter (INDEPENDENT_AMBULATORY_CARE_PROVIDER_SITE_OTHER): Payer: Self-pay

## 2022-09-29 ENCOUNTER — Other Ambulatory Visit: Payer: Self-pay | Admitting: Physician Assistant

## 2022-09-29 DIAGNOSIS — R059 Cough, unspecified: Secondary | ICD-10-CM | POA: Diagnosis not present

## 2022-09-29 DIAGNOSIS — R051 Acute cough: Secondary | ICD-10-CM

## 2022-09-29 DIAGNOSIS — R062 Wheezing: Secondary | ICD-10-CM | POA: Diagnosis not present

## 2022-09-30 ENCOUNTER — Other Ambulatory Visit (HOSPITAL_COMMUNITY): Payer: Self-pay

## 2022-09-30 DIAGNOSIS — Z79899 Other long term (current) drug therapy: Secondary | ICD-10-CM | POA: Diagnosis not present

## 2022-09-30 MED ORDER — PROMETHAZINE-DM 6.25-15 MG/5ML PO SYRP
5.0000 mL | ORAL_SOLUTION | Freq: Four times a day (QID) | ORAL | 0 refills | Status: AC
  Filled 2022-09-30: qty 200, 10d supply, fill #0

## 2022-10-02 ENCOUNTER — Telehealth: Payer: Self-pay

## 2022-10-02 ENCOUNTER — Ambulatory Visit (INDEPENDENT_AMBULATORY_CARE_PROVIDER_SITE_OTHER): Payer: 59 | Admitting: Bariatrics

## 2022-10-02 ENCOUNTER — Other Ambulatory Visit (HOSPITAL_COMMUNITY): Payer: Self-pay

## 2022-10-02 VITALS — BP 137/83 | HR 83 | Temp 98.6°F | Ht 64.0 in | Wt 201.0 lb

## 2022-10-02 DIAGNOSIS — Z6835 Body mass index (BMI) 35.0-35.9, adult: Secondary | ICD-10-CM | POA: Diagnosis not present

## 2022-10-02 DIAGNOSIS — E88819 Insulin resistance, unspecified: Secondary | ICD-10-CM

## 2022-10-02 DIAGNOSIS — I10 Essential (primary) hypertension: Secondary | ICD-10-CM | POA: Diagnosis not present

## 2022-10-02 DIAGNOSIS — E669 Obesity, unspecified: Secondary | ICD-10-CM

## 2022-10-02 MED ORDER — TRULICITY 1.5 MG/0.5ML ~~LOC~~ SOAJ
1.5000 mg | SUBCUTANEOUS | 0 refills | Status: DC
  Filled 2022-10-02: qty 2, 28d supply, fill #0

## 2022-10-02 NOTE — Progress Notes (Signed)
NEUROLOGY CONSULTATION NOTE  Jacqueline Good MRN: [id number] DOB: Jun 20, 1961  Referring provider: Thayer Ohm, PA Primary care provider: Thayer Ohm, PA  Reason for consult:  headache  Assessment/Plan:   Chronic tension type headache, not intractable - likely triggered by underlying stress (worried about her elderly parents)  MRI of brain ordered by her PCP scheduled for tomorrow. Increase topiramate to '75mg'$  at bedtime.  If no improvement in 4 weeks, we can increase to '100mg'$  at bedtime.  If ineffective, other options would be to add tizanidine three times daily or switch from mirtazapine to nortriptyline.   We discussed risk of rebound headache.  She is prescribed opioids for chronic back pain. Keep headache diary Follow up 4-5 months.   Subjective:  Jacqueline Good is a 62 year old right-handed female with chronic back pain who presents for headache.  History supplemented by referring provider's note.  Onset:  2 months ago.  No prior history. No preceding head trauma.  However, she reports a lot of stress.  More recently, in particular, she is worried about losing her parents as they are elderly.   Location:  around the eyes, maxillary regions, started radiating to back of head and front of head Quality:  throbbing Intensity:  moderate Aura:  absent Prodrome:  absent Associated symptoms:  on occasion, photophobia.  She denies associated unilateral numbness or weakness.  In general, vision seems a little more blurred.  Has upcoming eye appointment Duration:  20 minutes Frequency:  They were occurring 3-4 times a day, now 1 to 2 times a day. Triggers:  wearing hats Relieving factors:  no Activity:  able to function  She has an MRI of the brain scheduled tomorrow.    Past NSAIDS/analgesics:  meloxicam, naproxen Past abortive triptans:  none Past abortive ergotamine:  none Past muscle relaxants:  tizanidine Past anti-emetic:  none Past antihypertensive  medications:  none Past antidepressant medications:  Cymbalta Past anticonvulsant medications:  gabapentin Past anti-CGRP:  none   Current NSAIDS/analgesics:  Tylenol, Norco, oxycodone Current triptans:  none Current ergotamine:  none Current anti-emetic:  none Current muscle relaxants:  none Current Antihypertensive medications:  propranolol '40mg'$  daily PRN (palpitations) Current Antidepressant medications:  venlafaxine '225mg'$  daily, bupropion, mirtazapine '30mg'$  QHS Current Anticonvulsant medications:  topiramate '50mg'$  at bedtime (for weight loss) Current anti-CGRP:  none Current Vitamins/Herbal/Supplements:  MVI Current Antihistamines/Decongestants:  Claritin Other therapy:  none Birth control:  none Other medications:  Ambien, Latuda, Xanax, Narcan   Caffeine:  Coffee on weekends.  No soda or tea Diet:  Drinks plenty of water.   Exercise:  cannot due to back pain Depression:  yes; Anxiety:  yes.  As stated above. Other pain:  chronic back pain Sleep hygiene:  Good but sometimes interrupted sleep.  "Brain won't shut down".  Takes mirtazapine. Family history of headache:  no      PAST MEDICAL HISTORY: Past Medical History:  Diagnosis Date   Anemia    Anxiety    Arthritis    "all over" (08/12/2018)   Asthma    Back pain    Benign paroxysmal positional vertigo 10/31/2014   Breast cancer, right breast ([place]) dx'd 02/2018   Chronic bronchitis ([place])    Depression    Family history of breast cancer    Family history of prostate cancer    Family history of uterine cancer    GERD (gastroesophageal reflux disease)    History of blood transfusion    "several; related to low blood" (  08/12/2018)   History of radiation therapy 11/15/18- 12/17/18   Right Chest wall, SCV, PAB. 25 fractions.    Lymphedema of right arm    Palpitations    Personal history of chemotherapy    Personal history of radiation therapy     PAST SURGICAL HISTORY: Past Surgical History:  Procedure Laterality Date    AXILLARY LYMPH NODE DISSECTION Right 08/12/2018   Procedure: AXILLARY LYMPH NODE DISSECTION;  Surgeon: Alphonsa Overall, MD;  Location: [place];  Service: General;  Laterality: Right;   BREAST BIOPSY Right 03/2018   BREAST RECONSTRUCTION WITH PLACEMENT OF TISSUE EXPANDER AND FLEX HD (ACELLULAR HYDRATED DERMIS) Right 08/12/2018   Procedure: RIGHT BREAST RECONSTRUCTION WITH PLACEMENT OF TISSUE EXPANDER AND FLEX HD (ACELLULAR HYDRATED DERMIS);  Surgeon: Wallace Going, DO;  Location: [place];  Service: Plastics;  Laterality: Right;   BREAST REDUCTION SURGERY Left 09/28/2019   Procedure: LEFT MAMMARY REDUCTION/MASTOPEXY  (BREAST);  Surgeon: Wallace Going, DO;  Location: [place];  Service: Plastics;  Laterality: Left;   DILATION AND CURETTAGE OF UTERUS     ENDOMETRIAL ABLATION     IR IMAGING GUIDED PORT INSERTION  03/18/2018   MASTECTOMY MODIFIED RADICAL Right 08/12/2018   Procedure: RIGHT MODIFIED RADICAL MASTECTOMY;  Surgeon: Alphonsa Overall, MD;  Location: [place];  Service: General;  Laterality: Right;   MASTECTOMY MODIFIED RADICAL Right 08/12/2018   w/axillary LND   MYOMECTOMY     REDUCTION MAMMAPLASTY Left 2021   REMOVAL OF TISSUE EXPANDER AND PLACEMENT OF IMPLANT Right 10/26/2018   Procedure: Removal of right breast expander;  Surgeon: Wallace Going, DO;  Location: [place];  Service: Plastics;  Laterality: Right;  75 min, please    MEDICATIONS: Current Outpatient Medications on File Prior to Visit  Medication Sig Dispense Refill   Albuterol Sulfate (PROAIR RESPICLICK) [id number] (90 Base) MCG/ACT AEPB Inhale 2 puffs into the lungs every 4 (four) hours as needed. 1 each 1   ALPRAZolam (XANAX) 1 MG tablet Take 1 tablet (1 mg total) by mouth 3 (three) times daily as needed for anxiety. 90 tablet 2   buPROPion (WELLBUTRIN SR) 150 MG 12 hr tablet Take 1 tablet by mouth daily. 30 tablet 0   chlorthalidone (HYGROTON) 25 MG tablet Take 1 tablet (25 mg total) by mouth daily. 30 tablet 0    esomeprazole (NEXIUM) 40 MG capsule TAKE 1 CAPSULE (40 MG TOTAL) BY MOUTH DAILY. 30 capsule 2   esomeprazole (NEXIUM) 40 MG capsule Take 1 capsule (40 mg total) by mouth daily. 30 capsule 2   fluticasone-salmeterol (ADVAIR) 100-50 MCG/ACT AEPB Inhale 1 puff into the lungs 2 (two) times daily. 60 each 4   HYDROcodone-acetaminophen (NORCO/VICODIN) 5-325 MG tablet Take 1 tablet by mouth twice a day as needed 10 tablet 0   HYDROcodone-acetaminophen (NORCO/VICODIN) 5-325 MG tablet Take 1 tablet by mouth twice a day as needed. 10 tablet 0   LATUDA 40 MG TABS tablet Take 1 tablet (40 mg total) by mouth daily with breakfast. 90 tablet 0   loratadine (CLARITIN) 10 MG tablet Take 1 tablet (10 mg total) by mouth daily as needed for allergies. 30 tablet 11   metFORMIN (GLUCOPHAGE) 500 MG tablet Take 1 tablet (500 mg total) by mouth 2 (two) times daily with meals. 60 tablet 0   mirtazapine (REMERON) 30 MG tablet Take 1 tablet (30 mg total) by mouth at bedtime. 90 tablet 1   Multiple Vitamin (MULTIVITAMIN) capsule Take 1 capsule by mouth daily.  naloxone (NARCAN) nasal spray 4 mg/0.1 mL Spray 1 spray into 1 nostril every 3 minutes as needed.   Alternate nostrils with each dose until help arrives 2 each 1   omeprazole (PRILOSEC) 40 MG capsule TAKE 1 CAPSULE BY MOUTH DAILY. 30 capsule 2   oxyCODONE-acetaminophen (PERCOCET) 10-325 MG tablet Take 1 tablet by mouth 3 (three) times daily as needed. 45 tablet 0   oxyCODONE-acetaminophen (PERCOCET) 10-325 MG tablet Take 1 tablet by mouth 4 times daily as needed. 120 tablet 0   potassium chloride (KLOR-CON M) 10 MEQ tablet Take 1 tablet by mouth with food once daily 10 tablet 0   promethazine-dextromethorphan (PROMETHAZINE-DM) 6.25-15 MG/5ML syrup Take 5 mLs by mouth every 6 (six) hours for 10 days. 200 mL 0   propranolol (INDERAL) 40 MG tablet Take 40 mg by mouth daily as needed (heart palpitation.).      Semaglutide-Weight Management (WEGOVY) 0.25 MG/0.5ML SOAJ  Inject 0.25 mg into the skin once a week. 2 mL 0   tiZANidine (ZANAFLEX) 4 MG tablet Take 1 tablet by mouth twice daily as needed. 30 tablet 0   topiramate (TOPAMAX) 50 MG tablet Take 1 tablet by mouth daily with supper. 30 tablet 0   venlafaxine XR (EFFEXOR-XR) 75 MG 24 hr capsule Take 3 capsules (225 mg total) by mouth daily with breakfast. 270 capsule 1   Vitamin D, Ergocalciferol, (DRISDOL) 1.25 MG (50000 UNIT) CAPS capsule Take 1 capsule by mouth every 7 days. 5 capsule 0   zolpidem (AMBIEN) 10 MG tablet Take 1 tablet (10 mg total) by mouth at bedtime as needed. 30 tablet 2   No current facility-administered medications on file prior to visit.    ALLERGIES: Allergies  Allergen Reactions   Gadolinium Derivatives Itching and Cough    Pt began sneezing and coughing as soon as MRI contrast was injected. Severe nasal congestion. No rash or hives no SOB.    Hydrocodone Hives and Itching    Pt says she can take it with benadryl.    Iodinated Contrast Media Itching    FAMILY HISTORY: Family History  Problem Relation Age of Onset   Lupus Sister    Heart disease Sister    Breast cancer Mother 62   Prostate cancer Paternal Uncle    Diabetes Maternal Grandmother    Heart Problems Maternal Grandmother    Prostate cancer Maternal Grandfather    Prostate cancer Paternal Grandfather    Prostate cancer Other        MGFs brother   Breast cancer Other        Mat great-grandmother's sister   Uterine cancer Maternal Great-grandmother    Prostate cancer Other        PGFs brother    Objective:  Blood pressure 129/85, pulse 80, height '5\' 4"'$  (1.626 m), weight 207 lb (93.9 kg), SpO2 93 %. General: No acute distress.  Patient appears well-groomed.   Head:  Normocephalic/atraumatic Eyes:  fundi examined but not visualized Neck: supple, no paraspinal tenderness, full range of motion Back: No paraspinal tenderness Heart: regular rate and rhythm Lungs: Clear to auscultation  bilaterally. Vascular: No carotid bruits. Neurological Exam: Mental status: alert and oriented to person, place, and time, speech fluent and not dysarthric, language intact. Cranial nerves: CN I: not tested CN II: pupils equal, round and reactive to light, visual fields intact CN III, IV, VI:  full range of motion, no nystagmus, no ptosis CN V: facial sensation intact. CN VII: upper and lower face symmetric  CN VIII: hearing intact CN IX, X: gag intact, uvula midline CN XI: sternocleidomastoid and trapezius muscles intact CN XII: tongue midline Bulk & Tone: normal, no fasciculations. Motor:  muscle strength 5/5 throughout Sensation:  Pinprick, temperature and vibratory sensation intact. Deep Tendon Reflexes:  2+ throughout,  toes downgoing.   Finger to nose testing:  Without dysmetria.   Heel to shin:  Without dysmetria.   Gait:  Normal station and stride.  Romberg negative.    Thank you for allowing me to take part in the care of this patient.  Metta Clines, DO  CC: Thayer Ohm, PA

## 2022-10-02 NOTE — Progress Notes (Deleted)
Office: [phone]  /  Fax: [phone]  WEIGHT SUMMARY AND BIOMETRICS  Weight Lost Since Last Visit: 8lb  No data recorded  Vitals Temp: 98.6 F (37 C) BP: 137/83 Pulse Rate: 83 SpO2: 96 %   Anthropometric Measurements Height: '5\' 4"'$  (1.626 m) Weight: 201 lb (91.2 kg) BMI (Calculated): 34.48 Weight at Last Visit: 209lb Weight Lost Since Last Visit: 8lb   Body Composition  Body Fat %: 47.2 % Fat Mass (lbs): 95 lbs Muscle Mass (lbs): 100.8 lbs Total Body Water (lbs): 70.4 lbs Visceral Fat Rating : 13   Other Clinical Data Fasting: No Today's Visit #: 21     HPI  Chief Complaint: OBESITY  Jacqueline Good is here to discuss her progress with her obesity treatment plan. She is on the the Category 1 Plan and states she is following her eating plan approximately 70 % of the time. She states she is exercising 0 minutes 0 days per week.   Interval History:  Since last office visit she ***   Pharmacotherapy for weight loss: She {srtis (Optional):29129} currently taking {srtpreviousweightlossmeds (Optional):29124} for medical weight loss.  Denies side effects.    Previous pharmacotherapy for medical weight loss:  {srtpreviousweightlossmeds (Optional):29124}  She stopped *** due to side effects of ***.  Bariatric surgery:  Patient is status post {srtweightlosssurgery:29125} by Dr.  Marland Kitchen in ***.  Her highest weight prior to surgery was *** and her nadir weight after surgery was ***.  She is taking {srtvitamins (Optional):29126}.  She reports {srtrestriction (Optional):29127} restriction.     PHYSICAL EXAM:  Blood pressure 137/83, pulse 83, temperature 98.6 F (37 C), height '5\' 4"'$  (1.626 m), weight 201 lb (91.2 kg), SpO2 96 %. Body mass index is 34.5 kg/m.  General: She is overweight, cooperative, alert, well developed, and in no acute distress. PSYCH: Has normal mood, affect and thought process.   Extremities: No edema.  Neurologic: No gross sensory or motor  deficits. No tremors or fasciculations noted.    DIAGNOSTIC DATA REVIEWED:  BMET    Component Value Date/Time   NA 141 12/26/2021 1114   NA 142 11/27/2021 0759   K 3.8 12/26/2021 1114   CL 105 12/26/2021 1114   CO2 28 12/26/2021 1114   GLUCOSE 94 12/26/2021 1114   BUN 23 12/26/2021 1114   BUN 22 11/27/2021 0759   CREATININE 1.02 (H) 12/26/2021 1114   CREATININE 0.83 09/24/2016 1151   CALCIUM 9.5 12/26/2021 1114   GFRNONAA >60 12/26/2021 1114   GFRNONAA 79 09/24/2016 1151   GFRAA >60 02/09/2020 1409   GFRAA >89 09/24/2016 1151   Lab Results  Component Value Date   HGBA1C 5.1 11/27/2021   HGBA1C 5.4 04/27/2014   Lab Results  Component Value Date   INSULIN 11.7 11/27/2021   INSULIN 31.4 (H) 03/21/2021   Lab Results  Component Value Date   TSH 2.200 03/21/2021   CBC    Component Value Date/Time   WBC 6.4 12/26/2021 1114   WBC 6.7 08/15/2020 1441   RBC 4.53 12/26/2021 1114   HGB 12.9 12/26/2021 1114   HCT 39.6 12/26/2021 1114   PLT 211 12/26/2021 1114   MCV 87.4 12/26/2021 1114   MCH 28.5 12/26/2021 1114   MCHC 32.6 12/26/2021 1114   RDW 13.4 12/26/2021 1114   Iron Studies No results found for: "IRON", "TIBC", "FERRITIN", "IRONPCTSAT" Lipid Panel     Component Value Date/Time   CHOL 195 11/27/2021 0759   TRIG 136 11/27/2021 0759   HDL 44  11/27/2021 0759   CHOLHDL 3.2 02/18/2019 1035   VLDL 23 02/18/2019 1035   LDLCALC 127 (H) 11/27/2021 0759   Hepatic Function Panel     Component Value Date/Time   PROT 7.5 12/26/2021 1114   PROT 7.1 11/27/2021 0759   ALBUMIN 4.3 12/26/2021 1114   ALBUMIN 4.6 11/27/2021 0759   AST 14 (L) 12/26/2021 1114   ALT 12 12/26/2021 1114   ALKPHOS 78 12/26/2021 1114   BILITOT 0.7 12/26/2021 1114      Component Value Date/Time   TSH 2.200 03/21/2021 0916   Nutritional Lab Results  Component Value Date   VD25OH 33.2 11/27/2021   VD25OH 14.4 (L) 03/21/2021     ASSESSMENT AND PLAN  TREATMENT PLAN FOR  OBESITY:  Recommended Dietary Goals  Jacqueline Good is currently in the action stage of change. As such, her goal is to continue weight management plan. She has agreed to {MWMwtlossportion/plan2:23431}.  Behavioral Intervention  We discussed the following Behavioral Modification Strategies today: {EMWMwtlossstrategies:28914::"increasing lean protein intake","increasing vegetables","increase water intake","work on meal planning and easy cooking plans","think about ways to increase physical activity"}.  Additional resources provided today: NA  Recommended Physical Activity Goals  Jacqueline Good has been advised to work up to 150 minutes of moderate intensity aerobic activity a week and strengthening exercises 2-3 times per week for cardiovascular health, weight loss maintenance and preservation of muscle mass.   She has agreed to {EMEXERCISE:28847::"increase physical activity in their day and reduce sedentary time (increase NEAT). "}   Pharmacotherapy We discussed various medication options to help Jacqueline Good with her weight loss efforts and we both agreed to ***.  ASSOCIATED CONDITIONS ADDRESSED TODAY  Action/Plan  There are no diagnoses linked to this encounter.       No follow-ups on file.Marland Kitchen She was informed of the importance of frequent follow up visits to maximize her success with intensive lifestyle modifications for her multiple health conditions.   ATTESTASTION STATEMENTS:  Reviewed by clinician on day of visit: allergies, medications, problem list, medical history, surgical history, family history, social history, and previous encounter notes.   Time spent on visit including pre-visit chart review and post-visit care and charting was *** minutes.    [name]. Tickerhoff FNP-C

## 2022-10-02 NOTE — Telephone Encounter (Signed)
Started prior authorization for [employer]

## 2022-10-02 NOTE — Progress Notes (Unsigned)
Office: [phone]  /  Fax: [phone]  WEIGHT SUMMARY AND BIOMETRICS  Weight Lost Since Last Visit: 8lb   Medical Weight Loss Height: '5\' 4"'$  (1.626 m) Temp: 98.6 F (37 C) Pulse Rate: 83 BP: 137/83 SpO2: 96 % Fasting: No Today's Visit #: 21 Weight at Last Visit: 209lb Weight Lost Since Last Visit: 8lb  Body Fat %: 47.2 % Fat Mass (lbs): 95 lbs Muscle Mass (lbs): 100.8 lbs Total Body Water (lbs): 70.4 lbs Visceral Fat Rating : 13     HPI  Chief Complaint: OBESITY  Jacqueline Good is here to discuss her progress with her obesity treatment plan. She is on the the Category 1 Plan and states she is following her eating plan approximately 70 % of the time. She states she is exercising  minutes 0 days per week.   Interval History:  Since last office visit she has lost 8 lbs since her last visit.    Pharmacotherapy for weight loss: She is currently taking  Metformin   for medical weight loss.  Denies side effects.    Previous pharmacotherapy for medical weight loss:  Phentermine, Topamax, and Wellbutrin  She stopped all the medications above due to lack of weight loss, and the Phentermine raised her blood pressure.    PHYSICAL EXAM:  Blood pressure 137/83, pulse 83, temperature 98.6 F (37 C), height '5\' 4"'$  (1.626 m), weight 201 lb (91.2 kg), SpO2 96 %. Body mass index is 34.5 kg/m.  General: She is overweight, cooperative, alert, well developed, and in no acute distress. PSYCH: Has normal mood, affect and thought process.   Extremities: No edema.  Neurologic: No gross sensory or motor deficits. No tremors or fasciculations noted.    DIAGNOSTIC DATA REVIEWED:  BMET    Component Value Date/Time   NA 141 12/26/2021 1114   NA 142 11/27/2021 0759   K 3.8 12/26/2021 1114   CL 105 12/26/2021 1114   CO2 28 12/26/2021 1114   GLUCOSE 94 12/26/2021 1114   BUN 23 12/26/2021 1114   BUN 22 11/27/2021 0759   CREATININE 1.02 (H) 12/26/2021 1114   CREATININE 0.83  09/24/2016 1151   CALCIUM 9.5 12/26/2021 1114   GFRNONAA >60 12/26/2021 1114   GFRNONAA 79 09/24/2016 1151   GFRAA >60 02/09/2020 1409   GFRAA >89 09/24/2016 1151   Lab Results  Component Value Date   HGBA1C 5.1 11/27/2021   HGBA1C 5.4 04/27/2014   Lab Results  Component Value Date   INSULIN 11.7 11/27/2021   INSULIN 31.4 (H) 03/21/2021   Lab Results  Component Value Date   TSH 2.200 03/21/2021   CBC    Component Value Date/Time   WBC 6.4 12/26/2021 1114   WBC 6.7 08/15/2020 1441   RBC 4.53 12/26/2021 1114   HGB 12.9 12/26/2021 1114   HCT 39.6 12/26/2021 1114   PLT 211 12/26/2021 1114   MCV 87.4 12/26/2021 1114   MCH 28.5 12/26/2021 1114   MCHC 32.6 12/26/2021 1114   RDW 13.4 12/26/2021 1114   Iron Studies No results found for: "IRON", "TIBC", "FERRITIN", "IRONPCTSAT" Lipid Panel     Component Value Date/Time   CHOL 195 11/27/2021 0759   TRIG 136 11/27/2021 0759   HDL 44 11/27/2021 0759   CHOLHDL 3.2 02/18/2019 1035   VLDL 23 02/18/2019 1035   LDLCALC 127 (H) 11/27/2021 0759   Hepatic Function Panel     Component Value Date/Time   PROT 7.5 12/26/2021 1114   PROT 7.1 11/27/2021 0759  ALBUMIN 4.3 12/26/2021 1114   ALBUMIN 4.6 11/27/2021 0759   AST 14 (L) 12/26/2021 1114   ALT 12 12/26/2021 1114   ALKPHOS 78 12/26/2021 1114   BILITOT 0.7 12/26/2021 1114      Component Value Date/Time   TSH 2.200 03/21/2021 0916   Nutritional Lab Results  Component Value Date   VD25OH 33.2 11/27/2021   VD25OH 14.4 (L) 03/21/2021     ASSESSMENT AND PLAN  Hypertension Hypertension reasonably well controlled.  Medication(s): Propranolol   BP Readings from Last 3 Encounters:  10/03/22 129/85  10/02/22 137/83  08/21/22 (!) 141/91   Lab Results  Component Value Date   CREATININE 1.02 (H) 12/26/2021   CREATININE 1.05 (H) 11/27/2021   CREATININE 1.03 (H) 06/27/2021   No results found for: "GFR"  Plan: 1.Continue all antihypertensives at current  dosages. 2. No added salt.    Insulin Resistance [place] has had elevated fasting insulin readings. Goal is HgbA1c < 5.7, fasting insulin at l0 or less, and preferably at 5.  She reports polyphagia at times, and still has some cravings.  Medication(s): Will continue Metformin and will begin Trulicity [id number] mg into the skin weekly. Disp 2 ml with O refills.  Lab Results  Component Value Date   HGBA1C 5.1 11/27/2021   Lab Results  Component Value Date   INSULIN 11.7 11/27/2021   INSULIN 31.4 (H) 03/21/2021    Plan Will work on the agreed upon plan. Will minimize refined carbohydrates ( sweets and starches), and focus more on complex carbohydrates.   Generalized Obesity: See plan and exercise.   Class 2 obesity with BMI of 35.   TREATMENT PLAN FOR OBESITY:  Recommended Dietary Goals  Komal is currently in the action stage of change. As such, her goal is to continue weight management plan. She has agreed to the Category 1 Plan.  Behavioral Intervention  We discussed the following Behavioral Modification Strategies today: increasing lean protein intake, increasing vegetables, increasing fiber rich foods, avoiding skipping meals, increasing water intake, work on meal planning and easy cooking plans, increase water intake, and think about ways to increase physical activity.  Additional resources provided today: NA  Recommended Physical Activity Goals  Ugochi has been advised to work up to 150 minutes of moderate intensity aerobic activity a week and strengthening exercises 2-3 times per week for cardiovascular health, weight loss maintenance and preservation of muscle mass.   She has agreed to increase physical activity in their day and reduce sedentary time (increase NEAT).  and continue physical activity as is.    She will follow-up in 4 weeks.   She was informed of the importance of frequent follow up visits to maximize her success with intensive lifestyle modifications  for her multiple health conditions.   ATTESTATION STATEMENTS:  Reviewed by clinician on day of visit: allergies, medications, problem list, medical history, surgical history, family history, social history, and previous encounter notes.  Jerrika Ledlow A. Ruben ImO.

## 2022-10-03 ENCOUNTER — Ambulatory Visit (INDEPENDENT_AMBULATORY_CARE_PROVIDER_SITE_OTHER): Payer: 59 | Admitting: Neurology

## 2022-10-03 ENCOUNTER — Encounter: Payer: Self-pay | Admitting: Neurology

## 2022-10-03 ENCOUNTER — Other Ambulatory Visit (HOSPITAL_COMMUNITY): Payer: Self-pay

## 2022-10-03 VITALS — BP 129/85 | HR 80 | Ht 64.0 in | Wt 207.0 lb

## 2022-10-03 DIAGNOSIS — G44229 Chronic tension-type headache, not intractable: Secondary | ICD-10-CM

## 2022-10-03 MED ORDER — TOPIRAMATE 50 MG PO TABS
75.0000 mg | ORAL_TABLET | Freq: Every day | ORAL | 5 refills | Status: AC
  Filled 2022-10-03: qty 45, 30d supply, fill #0

## 2022-10-03 NOTE — Patient Instructions (Signed)
Increase topiramate to '75mg'$  at bedtime (take 1 1/2 tablets at bedtime).  If no improvement in 4 weeks, contact me and we can increase dose If possible, limit use of pain relievers to no more than 2 days out of week to prevent risk of rebound or medication-overuse headache. Keep headache diary Follow up 4 to 5 months.

## 2022-10-04 ENCOUNTER — Ambulatory Visit
Admission: RE | Admit: 2022-10-04 | Discharge: 2022-10-04 | Disposition: A | Discharge status: Home / Self Care | Payer: 59 | Source: Ambulatory Visit | Attending: Physician Assistant | Admitting: Physician Assistant

## 2022-10-04 ENCOUNTER — Other Ambulatory Visit (HOSPITAL_COMMUNITY): Payer: Self-pay

## 2022-10-04 DIAGNOSIS — R519 Headache, unspecified: Secondary | ICD-10-CM | POA: Diagnosis not present

## 2022-10-05 ENCOUNTER — Other Ambulatory Visit (HOSPITAL_COMMUNITY): Payer: Self-pay

## 2022-10-05 ENCOUNTER — Other Ambulatory Visit: Payer: Self-pay | Admitting: Bariatrics

## 2022-10-05 DIAGNOSIS — E88819 Insulin resistance, unspecified: Secondary | ICD-10-CM

## 2022-10-05 DIAGNOSIS — F3289 Other specified depressive episodes: Secondary | ICD-10-CM

## 2022-10-05 DIAGNOSIS — R632 Polyphagia: Secondary | ICD-10-CM

## 2022-10-06 ENCOUNTER — Other Ambulatory Visit (HOSPITAL_COMMUNITY): Payer: Self-pay

## 2022-10-06 ENCOUNTER — Encounter: Payer: Self-pay | Admitting: Bariatrics

## 2022-10-06 ENCOUNTER — Other Ambulatory Visit: Payer: Self-pay

## 2022-10-06 MED ORDER — METFORMIN HCL 500 MG PO TABS
500.0000 mg | ORAL_TABLET | Freq: Two times a day (BID) | ORAL | 0 refills | Status: DC
  Filled 2022-10-06: qty 60, 30d supply, fill #0

## 2022-10-06 MED ORDER — BUPROPION HCL ER (SR) 150 MG PO TB12
150.0000 mg | ORAL_TABLET | Freq: Every day | ORAL | 0 refills | Status: DC
  Filled 2022-10-06: qty 30, 30d supply, fill #0

## 2022-10-07 ENCOUNTER — Other Ambulatory Visit (HOSPITAL_COMMUNITY): Payer: Self-pay

## 2022-10-08 ENCOUNTER — Other Ambulatory Visit (HOSPITAL_COMMUNITY): Payer: Self-pay

## 2022-10-11 ENCOUNTER — Other Ambulatory Visit (HOSPITAL_COMMUNITY): Payer: Self-pay

## 2022-10-13 ENCOUNTER — Other Ambulatory Visit (HOSPITAL_COMMUNITY): Payer: Self-pay

## 2022-10-14 ENCOUNTER — Other Ambulatory Visit (HOSPITAL_COMMUNITY): Payer: Self-pay

## 2022-10-16 ENCOUNTER — Encounter (HOSPITAL_COMMUNITY): Payer: Self-pay | Admitting: Emergency Medicine

## 2022-10-16 ENCOUNTER — Ambulatory Visit (INDEPENDENT_AMBULATORY_CARE_PROVIDER_SITE_OTHER): Payer: 59

## 2022-10-16 ENCOUNTER — Ambulatory Visit (HOSPITAL_COMMUNITY)
Admission: EM | Admit: 2022-10-16 | Discharge: 2022-10-16 | Disposition: A | Discharge status: Home / Self Care | Payer: 59 | Attending: Sports Medicine | Admitting: Sports Medicine

## 2022-10-16 DIAGNOSIS — M25551 Pain in right hip: Secondary | ICD-10-CM | POA: Diagnosis not present

## 2022-10-16 DIAGNOSIS — M25512 Pain in left shoulder: Secondary | ICD-10-CM

## 2022-10-16 DIAGNOSIS — M25511 Pain in right shoulder: Secondary | ICD-10-CM | POA: Diagnosis not present

## 2022-10-16 DIAGNOSIS — W19XXXA Unspecified fall, initial encounter: Secondary | ICD-10-CM

## 2022-10-16 DIAGNOSIS — M25561 Pain in right knee: Secondary | ICD-10-CM

## 2022-10-16 DIAGNOSIS — M25559 Pain in unspecified hip: Secondary | ICD-10-CM

## 2022-10-16 DIAGNOSIS — M25562 Pain in left knee: Secondary | ICD-10-CM | POA: Diagnosis not present

## 2022-10-16 DIAGNOSIS — M1712 Unilateral primary osteoarthritis, left knee: Secondary | ICD-10-CM | POA: Diagnosis not present

## 2022-10-16 DIAGNOSIS — H25813 Combined forms of age-related cataract, bilateral: Secondary | ICD-10-CM | POA: Diagnosis not present

## 2022-10-16 DIAGNOSIS — M1711 Unilateral primary osteoarthritis, right knee: Secondary | ICD-10-CM | POA: Diagnosis not present

## 2022-10-16 NOTE — ED Triage Notes (Signed)
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 Pt c/o entire body hurting today. Denies hitting head or LOC or taking blood thinners. Took oxycodone for pain

## 2022-10-16 NOTE — Discharge Instructions (Addendum)
For your shoulder knee and hip pain I recommend some ice, 20 minutes on 20 minutes off a couple times a day.  I also recommend calling her pain management clinic to see their recommendation for any other pain medications. But uses knee braces when you know you are going to be up and walking around.  I would not sleep in them.  Work on gentle range of motion in her knees and shoulders.  Follow-up with your primary care provider in 5 days if you have no improvement in your pain.

## 2022-10-16 NOTE — ED Provider Notes (Addendum)
[facility]    CSN: [id number] Arrival date & time: 10/16/22  [id number]      History   Chief Complaint Chief Complaint  Patient presents with   Fall    HPI Jacqueline Good is a 62 y.o. female.   Ms. Leverette is here today with chief complaint of bilateral shoulder, bilateral knee, right hip pain after mechanical fall yesterday.  She was walking into a business yesterday when she tripped over a small lip in the floor.  She fell forward flat onto her knees with her arms outstretched.  She had some pain immediately and had some all over body pain last night with some difficulty sleeping.  She reports she has some pain with ambulation and feels like her knees are going to buckle when she is walking.  She took her previously prescribed OxyContin for her chronic back pain which she follows with pain management for but she reports did not resolve her pain.  She was told by her neurologist not to take any more Tylenol so she has not tried that.  She has tried topical Voltaren in the past but reports symptoms does not help her along with lidocaine patches which she reports have stopped working for her pain.  She is here today when he checked out close her pain persisted after yesterday.   Fall    Past Medical History:  Diagnosis Date   Anemia    Anxiety    Arthritis    "all over" (08/12/2018)   Asthma    Back pain    Benign paroxysmal positional vertigo 10/31/2014   Breast cancer, right breast ([place]) dx'd 02/2018   Chronic bronchitis ([place])    Depression    Family history of breast cancer    Family history of prostate cancer    Family history of uterine cancer    GERD (gastroesophageal reflux disease)    History of blood transfusion    "several; related to low blood" (08/12/2018)   History of radiation therapy 11/15/18- 12/17/18   Right Chest wall, SCV, PAB. 25 fractions.    Lymphedema of right arm    Palpitations    Personal history of chemotherapy    Personal history of radiation  therapy     Patient Active Problem List   Diagnosis Date Noted   Essential hypertension 08/21/2022   Polyphagia 04/23/2022   Depression 01/08/2022   Chronic superficial gastritis without bleeding 08/06/2021   Gastroduodenitis 08/06/2021   Dysphagia 08/06/2021   Stricture of esophagus 08/06/2021   Personal history of malignant neoplasm of breast 08/06/2021   Insulin resistance 04/26/2021   Vitamin D deficiency 03/26/2021   Postoperative breast asymmetry 09/13/2019   Genetic testing 10/01/2018   Family history of breast cancer    Family history of uterine cancer    Family history of prostate cancer    S/P mastectomy, right 08/20/2018   Acquired absence of right breast 08/20/2018   MDD (major depressive disorder), recurrent episode, moderate ([place]) 06/13/2018   Generalized anxiety disorder 06/13/2018   OCD (obsessive compulsive disorder) 06/13/2018   Insomnia 06/13/2018   Malignant neoplasm of upper-outer quadrant of right breast in female, estrogen receptor negative ([place]) 03/02/2018   Chronic bilateral low back pain without sciatica 06/03/2017   Arthritis 04/27/2014   Fatigue 06/10/2013   Class 3 severe obesity with serious comorbidity and body mass index (BMI) of 40.0 to 44.9 in adult ([place]) 06/10/2013   Asthma, chronic obstructive, with acute exacerbation ([place]) 06/10/2013   Allergy 06/10/2013  Past Surgical History:  Procedure Laterality Date   AXILLARY LYMPH NODE DISSECTION Right 08/12/2018   Procedure: AXILLARY LYMPH NODE DISSECTION;  Surgeon: Alphonsa Overall, MD;  Location: [facility];  Service: General;  Laterality: Right;   BREAST BIOPSY Right 03/2018   BREAST RECONSTRUCTION WITH PLACEMENT OF TISSUE EXPANDER AND FLEX HD (ACELLULAR HYDRATED DERMIS) Right 08/12/2018   Procedure: RIGHT BREAST RECONSTRUCTION WITH PLACEMENT OF TISSUE EXPANDER AND FLEX HD (ACELLULAR HYDRATED DERMIS);  Surgeon: Wallace Going, DO;  Location: [facility];  Service: Plastics;  Laterality: Right;   BREAST  REDUCTION SURGERY Left 09/28/2019   Procedure: LEFT MAMMARY REDUCTION/MASTOPEXY  (BREAST);  Surgeon: Wallace Going, DO;  Location: [facility];  Service: Plastics;  Laterality: Left;   DILATION AND CURETTAGE OF UTERUS     ENDOMETRIAL ABLATION     IR IMAGING GUIDED PORT INSERTION  03/18/2018   MASTECTOMY MODIFIED RADICAL Right 08/12/2018   Procedure: RIGHT MODIFIED RADICAL MASTECTOMY;  Surgeon: Alphonsa Overall, MD;  Location: [facility];  Service: General;  Laterality: Right;   MASTECTOMY MODIFIED RADICAL Right 08/12/2018   w/axillary LND   MYOMECTOMY     REDUCTION MAMMAPLASTY Left 2021   REMOVAL OF TISSUE EXPANDER AND PLACEMENT OF IMPLANT Right 10/26/2018   Procedure: Removal of right breast expander;  Surgeon: Wallace Going, DO;  Location: [facility];  Service: Plastics;  Laterality: Right;  75 min, please    OB History     Gravida  2   Para  1   Term  1   Preterm      AB  1   Living  1      SAB      IAB  1   Ectopic      Multiple      Live Births  1            Home Medications    Prior to Admission medications   Medication Sig Start Date End Date Taking? Authorizing Provider  Albuterol Sulfate (PROAIR RESPICLICK) [id number] (90 Base) MCG/ACT AEPB Inhale 2 puffs into the lungs every 4 (four) hours as needed. 07/30/22     ALPRAZolam (XANAX) 1 MG tablet Take 1 tablet (1 mg total) by mouth 3 (three) times daily as needed for anxiety. 08/15/22 11/19/22  Norman Clay, MD  buPROPion (WELLBUTRIN SR) 150 MG 12 hr tablet Take 1 tablet by mouth daily. 10/06/22 11/06/22  Jearld Lesch A, DO  chlorthalidone (HYGROTON) 25 MG tablet Take 1 tablet (25 mg total) by mouth daily. 08/21/22   Jearld Lesch A, DO  Dulaglutide (TRULICITY) 1.5 0000000 SOPN Inject 1.5 mg into the skin once a week. 10/02/22   Jearld Lesch A, DO  esomeprazole (NEXIUM) 40 MG capsule TAKE 1 CAPSULE (40 MG TOTAL) BY MOUTH DAILY. 06/10/21 10/03/22  [name], [name], MD  esomeprazole (NEXIUM) 40 MG capsule  Take 1 capsule (40 mg total) by mouth daily. 08/01/22   Gardenia Phlegm, NP  fluticasone-salmeterol (ADVAIR) 100-50 MCG/ACT AEPB Inhale 1 puff into the lungs 2 (two) times daily. 05/12/22     HYDROcodone-acetaminophen (NORCO/VICODIN) 5-325 MG tablet Take 1 tablet by mouth twice a day as needed 02/06/22     HYDROcodone-acetaminophen (NORCO/VICODIN) 5-325 MG tablet Take 1 tablet by mouth twice a day as needed. 02/27/22     LATUDA 40 MG TABS tablet Take 1 tablet (40 mg total) by mouth daily with breakfast. 08/25/22 12/26/22  Norman Clay, MD  loratadine (CLARITIN) 10 MG tablet Take 1 tablet (  10 mg total) by mouth daily as needed for allergies. 12/22/18   Kerin Perna, NP  metFORMIN (GLUCOPHAGE) 500 MG tablet Take 1 tablet (500 mg total) by mouth 2 (two) times daily with meals. 10/06/22   Jearld Lesch A, DO  mirtazapine (REMERON) 30 MG tablet Take 1 tablet (30 mg total) by mouth at bedtime. 04/16/22 10/13/22  Norman Clay, MD  Multiple Vitamin (MULTIVITAMIN) capsule Take 1 capsule by mouth daily.    [provider]  naloxone [facility]) nasal spray 4 mg/0.1 mL Spray 1 spray into 1 nostril every 3 minutes as needed.   Alternate nostrils with each dose until help arrives 05/27/22     omeprazole (PRILOSEC) 40 MG capsule TAKE 1 CAPSULE BY MOUTH DAILY. 11/15/18   Gardenia Phlegm, NP  oxyCODONE-acetaminophen (PERCOCET) 10-325 MG tablet Take 1 tablet by mouth 3 (three) times daily as needed. 05/26/22     oxyCODONE-acetaminophen (PERCOCET) 10-325 MG tablet Take 1 tablet by mouth 4 times daily as needed. 09/26/22     potassium chloride (KLOR-CON M) 10 MEQ tablet Take 1 tablet by mouth with food once daily 09/08/22     propranolol (INDERAL) 40 MG tablet Take 40 mg by mouth daily as needed (heart palpitation.).  07/30/18   [provider]  topiramate (TOPAMAX) 50 MG tablet Take 1.5 tablets (75 mg total) by mouth at bedtime. 10/03/22   Pieter Partridge, DO  venlafaxine XR (EFFEXOR-XR) 75 MG 24  hr capsule Take 3 capsules (225 mg total) by mouth daily with breakfast. 04/16/22 10/13/22  Norman Clay, MD  Vitamin D, Ergocalciferol, (DRISDOL) 1.25 MG (50000 UNIT) CAPS capsule Take 1 capsule by mouth every 7 days. 08/21/22   Jearld Lesch A, DO  zolpidem (AMBIEN) 10 MG tablet Take 1 tablet (10 mg total) by mouth at bedtime as needed. 08/18/22 11/19/22  Norman Clay, MD    Family History Family History  Problem Relation Age of Onset   Lupus Sister    Heart disease Sister    Breast cancer Mother 82   Prostate cancer Paternal Uncle    Diabetes Maternal Grandmother    Heart Problems Maternal Grandmother    Prostate cancer Maternal Grandfather    Prostate cancer Paternal Grandfather    Prostate cancer Other        MGFs brother   Breast cancer Other        Mat great-grandmother's sister   Uterine cancer Maternal Great-grandmother    Prostate cancer Other        PGFs brother    Social History Social History   Tobacco Use   Smoking status: Former    Packs/day: 1.00    Years: 38.00    Total pack years: 38.00    Types: Cigarettes    Quit date: 05/15/2018    Years since quitting: 4.4   Smokeless tobacco: Never  Vaping Use   Vaping Use: Never used  Substance Use Topics   Alcohol use: Not Currently    Comment: occasional drink   Drug use: No     Allergies   Gadolinium derivatives, Hydrocodone, and Iodinated contrast media   Review of Systems Review of Systems as listed above in HPI   Physical Exam Triage Vital Signs ED Triage Vitals  Enc Vitals Group     BP 10/16/22 0856 110/89     Pulse Rate 10/16/22 0856 79     Resp 10/16/22 0856 18     Temp 10/16/22 0856 98.2 F (36.8 C)  Temp Source 10/16/22 0856 Oral     SpO2 10/16/22 0856 95 %     Weight --      Height --      Head Circumference --      Peak Flow --      Pain Score 10/16/22 0855 10     Pain Loc --      Pain Edu? --      Excl. in [place]? --    No data found.  Updated Vital Signs BP 110/89 (BP Location:  Left Arm)   Pulse 79   Temp 98.2 F (36.8 C) (Oral)   Resp 18   SpO2 95%   Physical Exam Vitals reviewed.  Constitutional:      Appearance: Normal appearance. She is obese.  HENT:     Head: Normocephalic.  Eyes:     Pupils: Pupils are equal, round, and reactive to light.  Cardiovascular:     Rate and Rhythm: Normal rate.  Pulmonary:     Effort: Pulmonary effort is normal.  Musculoskeletal:     Comments: Bilateral shoulders: Decreased range of motion with forward flexion to approximately 165 degrees secondary to pain, patient with a history of mastectomy on the right side some decreased range of motion at baseline.  Decreased range of motion with abduction bilaterally secondary to pain.  Diffuse tenderness to palpation of bilateral shoulders.  Grip strength 5/5. Bilateral knees: No obvious deformity or asymmetry.  No ecchymosis, edema or effusion.  Tenderness to palpation inferior patella pole bilaterally and bilateral joint line tenderness on the right knee, medial joint line tenderness on the left.  She has range of motion from about 0 degrees to 100 flexion.  She is able to complete straight leg raise bilaterally.  Painful range of motion.  Stable to varus valgus stress.  Antalgic gait Right hip: Tenderness to palpation over the lateral hip.  Slightly decreased hip flexion secondary to pain.  She is able to ambulate without assistive device.  Neurological:     Mental Status: She is alert.    UC Treatments / Results  Labs (all labs ordered are listed, but only abnormal results are displayed) Labs Reviewed - No data to display  EKG   Radiology DG Knee AP/LAT W/Sun[place] Left  Result Date: 10/16/2022 CLINICAL DATA:  Bilateral knee pain since falling yesterday. EXAM: LEFT KNEE 3 VIEWS; RIGHT KNEE 3 VIEWS COMPARISON:  Bilateral knee radiographs 12/30/2010. FINDINGS: Right knee: The mineralization and alignment are normal. There is no evidence of acute fracture or dislocation. Mild  patellofemoral degenerative changes. Chronic spurring of the medial femoral epicondyle consistent with remote MCL injury. No significant joint effusion or focal soft tissue abnormality. Left knee: The mineralization and alignment are normal. There is no evidence of acute fracture or dislocation. Mild tricompartmental joint space narrowing. No significant joint effusion or focal soft tissue abnormality. IMPRESSION: No evidence of acute fracture or dislocation in either knee. Mild degenerative changes as described. Electronically Signed   By: Richardean Sale M.D.   On: 10/16/2022 09:55   DG Knee AP/LAT W/Sunrise Right  Result Date: 10/16/2022 CLINICAL DATA:  Bilateral knee pain since falling yesterday. EXAM: LEFT KNEE 3 VIEWS; RIGHT KNEE 3 VIEWS COMPARISON:  Bilateral knee radiographs 12/30/2010. FINDINGS: Right knee: The mineralization and alignment are normal. There is no evidence of acute fracture or dislocation. Mild patellofemoral degenerative changes. Chronic spurring of the medial femoral epicondyle consistent with remote MCL injury. No significant joint effusion or focal soft tissue abnormality.  Left knee: The mineralization and alignment are normal. There is no evidence of acute fracture or dislocation. Mild tricompartmental joint space narrowing. No significant joint effusion or focal soft tissue abnormality. IMPRESSION: No evidence of acute fracture or dislocation in either knee. Mild degenerative changes as described. Electronically Signed   By: Richardean Sale M.D.   On: 10/16/2022 09:55    Procedures Procedures (including critical care time)  Medications Ordered in UC Medications - No data to display  Initial Impression / Assessment and Plan / UC Course  I have reviewed the triage vital signs and the nursing notes.  Pertinent labs & imaging results that were available during my care of the patient were reviewed by me and considered in my medical decision making (see chart for details).      Bilateral shoulder, right hip and bilateral knee pain after mechanical fall 3 view x-rays bilateral knees: No obvious fracture.  Mild- moderate tricompartmental osteoarthritis, greatest in the patellofemoral compartment. Remote MCL injury in the right knee, likely chronic but we will have her follow-up with her primary care provider. For her bilateral knee pain and buckling, likely secondary to acute flare of chronic osteoarthritis secondary to fall, she was given 2 hinged knee braces.  She ambulated with them and reported some improvement in pain and stability.  Patient is currently on opioid medication for chronic back pain managed by pain management group.  I recommend she call their office to discuss what she may add to her current regimen to help with her increase in pain.  We discussed topical Voltaren, lidocaine patches and Tylenol which she did not want to add to her regimen at this time.  I recommend she use ice for her shoulders knees and hip.  20 minutes on 20 minutes off with a barrier between the skin.   Follow-up with her primary care provider if pain worsens or fails to improve over the next 5 days.  She verbalized understanding.  Final Clinical Impressions(s) / UC Diagnoses   Final diagnoses:  Fall, initial encounter  Acute pain of both shoulders  Acute pain of both knees  Lateral pain of hip     Discharge Instructions      For your shoulder knee and hip pain I recommend some ice, 20 minutes on 20 minutes off a couple times a day.  I also recommend calling her pain management clinic to see their recommendation for any other pain medications. But uses knee braces when you know you are going to be up and walking around.  I would not sleep in them.  Work on gentle range of motion in her knees and shoulders.  Follow-up with your primary care provider in 5 days if you have no improvement in your pain.     ED Prescriptions   None    PDMP not reviewed this encounter.    Elmore Guise, DO 10/16/22 1004    Rodena Goldmann A, DO 10/16/22 1006

## 2022-10-20 DIAGNOSIS — M542 Cervicalgia: Secondary | ICD-10-CM | POA: Diagnosis not present

## 2022-10-20 DIAGNOSIS — M25511 Pain in right shoulder: Secondary | ICD-10-CM | POA: Diagnosis not present

## 2022-10-20 DIAGNOSIS — W19XXXD Unspecified fall, subsequent encounter: Secondary | ICD-10-CM | POA: Diagnosis not present

## 2022-10-20 DIAGNOSIS — M25512 Pain in left shoulder: Secondary | ICD-10-CM | POA: Diagnosis not present

## 2022-10-21 ENCOUNTER — Other Ambulatory Visit: Payer: Self-pay | Admitting: Physician Assistant

## 2022-10-21 ENCOUNTER — Ambulatory Visit
Admission: RE | Admit: 2022-10-21 | Discharge: 2022-10-21 | Disposition: A | Discharge status: Home / Self Care | Payer: 59 | Source: Ambulatory Visit | Attending: Physician Assistant | Admitting: Physician Assistant

## 2022-10-21 DIAGNOSIS — M25511 Pain in right shoulder: Secondary | ICD-10-CM

## 2022-10-21 DIAGNOSIS — M19012 Primary osteoarthritis, left shoulder: Secondary | ICD-10-CM | POA: Diagnosis not present

## 2022-10-21 DIAGNOSIS — M19011 Primary osteoarthritis, right shoulder: Secondary | ICD-10-CM | POA: Diagnosis not present

## 2022-10-21 DIAGNOSIS — M47812 Spondylosis without myelopathy or radiculopathy, cervical region: Secondary | ICD-10-CM | POA: Diagnosis not present

## 2022-10-21 DIAGNOSIS — M25512 Pain in left shoulder: Secondary | ICD-10-CM

## 2022-10-21 DIAGNOSIS — M542 Cervicalgia: Secondary | ICD-10-CM

## 2022-10-21 DIAGNOSIS — M25711 Osteophyte, right shoulder: Secondary | ICD-10-CM | POA: Diagnosis not present

## 2022-10-23 DIAGNOSIS — G8929 Other chronic pain: Secondary | ICD-10-CM | POA: Diagnosis not present

## 2022-10-23 DIAGNOSIS — M545 Low back pain, unspecified: Secondary | ICD-10-CM | POA: Diagnosis not present

## 2022-10-23 DIAGNOSIS — Z79899 Other long term (current) drug therapy: Secondary | ICD-10-CM | POA: Diagnosis not present

## 2022-10-23 DIAGNOSIS — R030 Elevated blood-pressure reading, without diagnosis of hypertension: Secondary | ICD-10-CM | POA: Diagnosis not present

## 2022-10-23 DIAGNOSIS — Z131 Encounter for screening for diabetes mellitus: Secondary | ICD-10-CM | POA: Diagnosis not present

## 2022-10-24 ENCOUNTER — Other Ambulatory Visit (HOSPITAL_COMMUNITY): Payer: Self-pay

## 2022-10-24 MED ORDER — OXYCODONE-ACETAMINOPHEN 10-325 MG PO TABS
ORAL_TABLET | ORAL | 0 refills | Status: DC
  Filled 2022-10-24 – 2022-10-27 (×2): qty 120, 30d supply, fill #0

## 2022-10-27 ENCOUNTER — Other Ambulatory Visit (HOSPITAL_COMMUNITY): Payer: Self-pay

## 2022-10-27 ENCOUNTER — Other Ambulatory Visit: Payer: Self-pay

## 2022-10-27 DIAGNOSIS — R296 Repeated falls: Secondary | ICD-10-CM | POA: Diagnosis not present

## 2022-10-27 DIAGNOSIS — M542 Cervicalgia: Secondary | ICD-10-CM | POA: Diagnosis not present

## 2022-10-27 DIAGNOSIS — M25511 Pain in right shoulder: Secondary | ICD-10-CM | POA: Diagnosis not present

## 2022-10-27 DIAGNOSIS — M25561 Pain in right knee: Secondary | ICD-10-CM | POA: Diagnosis not present

## 2022-10-27 MED ORDER — MELOXICAM 15 MG PO TABS
15.0000 mg | ORAL_TABLET | Freq: Every day | ORAL | 0 refills | Status: AC
  Filled 2022-10-27: qty 30, 30d supply, fill #0

## 2022-11-03 ENCOUNTER — Ambulatory Visit (INDEPENDENT_AMBULATORY_CARE_PROVIDER_SITE_OTHER): Payer: 59 | Admitting: Bariatrics

## 2022-11-03 ENCOUNTER — Other Ambulatory Visit (HOSPITAL_COMMUNITY): Payer: Self-pay

## 2022-11-03 ENCOUNTER — Encounter: Payer: Self-pay | Admitting: Bariatrics

## 2022-11-03 VITALS — BP 132/84 | HR 69 | Temp 98.1°F | Ht 64.0 in | Wt 197.0 lb

## 2022-11-03 DIAGNOSIS — R632 Polyphagia: Secondary | ICD-10-CM | POA: Diagnosis not present

## 2022-11-03 DIAGNOSIS — I10 Essential (primary) hypertension: Secondary | ICD-10-CM | POA: Diagnosis not present

## 2022-11-03 DIAGNOSIS — F3289 Other specified depressive episodes: Secondary | ICD-10-CM

## 2022-11-03 DIAGNOSIS — E559 Vitamin D deficiency, unspecified: Secondary | ICD-10-CM | POA: Diagnosis not present

## 2022-11-03 DIAGNOSIS — Z6833 Body mass index (BMI) 33.0-33.9, adult: Secondary | ICD-10-CM

## 2022-11-03 DIAGNOSIS — E88819 Insulin resistance, unspecified: Secondary | ICD-10-CM | POA: Diagnosis not present

## 2022-11-03 MED ORDER — TRULICITY 3 MG/0.5ML ~~LOC~~ SOAJ
3.0000 mg | SUBCUTANEOUS | 0 refills | Status: DC
  Filled 2022-11-03: qty 2, 28d supply, fill #0

## 2022-11-03 MED ORDER — METFORMIN HCL 500 MG PO TABS
500.0000 mg | ORAL_TABLET | Freq: Two times a day (BID) | ORAL | 0 refills | Status: DC
  Filled 2022-11-03: qty 60, 30d supply, fill #0

## 2022-11-03 MED ORDER — VITAMIN D (ERGOCALCIFEROL) 1.25 MG (50000 UNIT) PO CAPS
50000.0000 [IU] | ORAL_CAPSULE | ORAL | 0 refills | Status: DC
  Filled 2022-11-03: qty 5, 35d supply, fill #0

## 2022-11-03 MED ORDER — BUPROPION HCL ER (SR) 150 MG PO TB12
150.0000 mg | ORAL_TABLET | Freq: Every day | ORAL | 0 refills | Status: DC
  Filled 2022-11-03: qty 30, 30d supply, fill #0

## 2022-11-03 MED ORDER — CHLORTHALIDONE 25 MG PO TABS
25.0000 mg | ORAL_TABLET | Freq: Every day | ORAL | 0 refills | Status: DC
  Filled 2022-11-03: qty 30, 30d supply, fill #0

## 2022-11-04 NOTE — Progress Notes (Unsigned)
Chief Complaint:   OBESITY Jacqueline Good is here to discuss her progress with her obesity treatment plan along with follow-up of her obesity related diagnoses. Jacqueline Good is on the Category 1 Plan and states she is following her eating plan approximately 70% of the time. Jacqueline Good states she is doing 0 minutes 0 times per week.  Today's visit was #: 22 Starting weight: 238 lbs Starting date: 03/21/2021 Today's weight: 197 lbs Today's date: 11/03/2022 Total lbs lost to date: 41 Total lbs lost since last in-office visit: 4  Interim History: Jacqueline Good is down 4 pounds since her last visit and she is doing well overall.  She denies any struggles.  Subjective:   1. Vitamin D deficiency [name] is taking prescription vitamin D.  2. Insulin resistance Jacqueline Good is taking metformin.  3. Polyphagia Jacqueline Good is taking Trulicity, and she denies side effects.  4. Essential hypertension Jacqueline Good is taking chlorthalidone.  5. Other depression, emotional eating Jacqueline Good is taking Wellbutrin.  Assessment/Plan:   1. Vitamin D deficiency Jacqueline Good will continue prescription vitamin D, and we will refill for 1 month.  - Vitamin D, Ergocalciferol, (DRISDOL) 1.25 MG (50000 UNIT) CAPS capsule; Take 1 capsule by mouth every 7 days.  Dispense: 5 capsule; Refill: 0  2. Insulin resistance Jacqueline Good will continue metformin, and we will refill for 1 month.  - metFORMIN (GLUCOPHAGE) 500 MG tablet; Take 1 tablet (500 mg total) by mouth 2 (two) times daily with meals.  Dispense: 60 tablet; Refill: 0  3. Polyphagia Jacqueline Good will continue her medications, and we will refill metformin and Trulicity for 1 month.  - metFORMIN (GLUCOPHAGE) 500 MG tablet; Take 1 tablet (500 mg total) by mouth 2 (two) times daily with meals.  Dispense: 60 tablet; Refill: 0 - Dulaglutide (TRULICITY) 3 0000000 SOPN; Inject 3 mg as directed once a week.  Dispense: 2 mL; Refill: 0  4. Essential hypertension Jacqueline Good will continue  chlorthalidone, and we will refill for 1 month.  - chlorthalidone (HYGROTON) 25 MG tablet; Take 1 tablet (25 mg total) by mouth daily.  Dispense: 30 tablet; Refill: 0  5. Other depression, emotional eating Jacqueline Good will continue Wellbutrin SR, and we will refill for 1 month.  - buPROPion (WELLBUTRIN SR) 150 MG 12 hr tablet; Take 1 tablet by mouth daily.  Dispense: 30 tablet; Refill: 0  6. Morbid obesity ([place])  7. BMI 33.0-33.9,adult Jacqueline Good is currently in the action stage of change. As such, her goal is to continue with weight loss efforts. She has agreed to the Category 1 Plan.   Intentional eating and mindful eating were discussed.  Exercise goals: No exercise has been prescribed at this time.  Behavioral modification strategies: increasing lean protein intake, decreasing simple carbohydrates, increasing vegetables, increasing water intake, decreasing eating out, no skipping meals, meal planning and cooking strategies, keeping healthy foods in the home, and planning for success.  Jacqueline Good has agreed to follow-up with our clinic in 4 weeks. She was informed of the importance of frequent follow-up visits to maximize her success with intensive lifestyle modifications for her multiple health conditions.   Objective:   Blood pressure 132/84, pulse 69, temperature 98.1 F (36.7 C), height 5\' 4"  (1.626 m), weight 197 lb (89.4 kg), SpO2 99 %. Body mass index is 33.81 kg/m.  General: Cooperative, alert, well developed, in no acute distress. HEENT: Conjunctivae and lids unremarkable. Cardiovascular: Regular rhythm.  Lungs: Normal work of breathing. Neurologic: No focal deficits.   Lab Results  Component Value Date  CREATININE 1.02 (H) 12/26/2021   BUN 23 12/26/2021   NA 141 12/26/2021   K 3.8 12/26/2021   CL 105 12/26/2021   CO2 28 12/26/2021   Lab Results  Component Value Date   ALT 12 12/26/2021   AST 14 (L) 12/26/2021   ALKPHOS 78 12/26/2021   BILITOT 0.7 12/26/2021    Lab Results  Component Value Date   HGBA1C 5.1 11/27/2021   HGBA1C 5.5 03/21/2021   HGBA1C 5.4 04/27/2014   Lab Results  Component Value Date   INSULIN 11.7 11/27/2021   INSULIN 31.4 (H) 03/21/2021   Lab Results  Component Value Date   TSH 2.200 03/21/2021   Lab Results  Component Value Date   CHOL 195 11/27/2021   HDL 44 11/27/2021   LDLCALC 127 (H) 11/27/2021   TRIG 136 11/27/2021   CHOLHDL 3.2 02/18/2019   Lab Results  Component Value Date   VD25OH 33.2 11/27/2021   VD25OH 14.4 (L) 03/21/2021   Lab Results  Component Value Date   WBC 6.4 12/26/2021   HGB 12.9 12/26/2021   HCT 39.6 12/26/2021   MCV 87.4 12/26/2021   PLT 211 12/26/2021   No results found for: "IRON", "TIBC", "FERRITIN"  Attestation Statements:   Reviewed by clinician on day of visit: allergies, medications, problem list, medical history, surgical history, family history, social history, and previous encounter notes.   Wilhemena Durie, am acting as Location manager for [employer], DO.  I have reviewed the above documentation for accuracy and completeness, and I agree with the above. Jearld Lesch, DO

## 2022-11-06 ENCOUNTER — Encounter: Payer: Self-pay | Admitting: Bariatrics

## 2022-11-06 DIAGNOSIS — M542 Cervicalgia: Secondary | ICD-10-CM | POA: Diagnosis not present

## 2022-11-06 DIAGNOSIS — M25512 Pain in left shoulder: Secondary | ICD-10-CM | POA: Diagnosis not present

## 2022-11-06 NOTE — Progress Notes (Signed)
Virtual Visit via Video Note  I connected with Jacqueline Good on 11/07/22 at 11:00 AM EDT by a video enabled telemedicine application and verified that I am speaking with the correct person using two identifiers.  Location: Patient: home Provider: office Persons participated in the visit- patient, provider    I discussed the limitations of evaluation and management by telemedicine and the availability of in person appointments. The patient expressed understanding and agreed to proceed.     I discussed the assessment and treatment plan with the patient. The patient was provided an opportunity to ask questions and all were answered. The patient agreed with the plan and demonstrated an understanding of the instructions.   The patient was advised to call back or seek an in-person evaluation if the symptoms worsen or if the condition fails to improve as anticipated.  I provided 24 minutes of non-face-to-face time during this encounter.   Norman Clay, MD      [facility] MD/PA/NP OP Progress Note  11/07/2022 12:02 PM Jacqueline Good  MRN:  [id number]  Chief Complaint:  Chief Complaint  Patient presents with   Follow-up   HPI:  - she went to ED due to mechanical fall This is a follow-up appointment for depression, anxiety and insomnia.  She states that she had a fall, and she is in pain.  The floor was uneven.  She has braces, and is seen by physical therapist.  She is working on home exercise.  She is hoping to see her father.  When she is asked whether she sees her mother, she answers "of course."  She states that she has been doing good.  Her brother continues to use a drug.  She is in the room, and tries to limit the interaction with him.  She states that although it has been always stressful, she tries to dealt it the best she can.  She has middle insomnia, although she denies snoring at this time.  She feels depressed.  She states that her anxiety has been the same.  She has panic  attacks 3 times per month.  It is aggravated by the noises were due to her brother.  She tries to pace breathing.  She declined to go outside as she just prefers to stay in the house. She denies SI. She denies alcohol use or drug use.  Upon reviewing her medication, she states that she takes Xanax instead of mirtazapine for sleep.  She still has several tablets left.  She took twice this week.  When she is informed that the Xanax dose will be reduced, she states that anybody could fall due to the even platform.  She tripped and denies any dizziness.  However, she states that she does not have any control over her medication, and verbalized understanding that the total dose of Xanax will be reduced.  She states that she will not take mirtazapine now that Xanax is what use.  She states that she feels tired of taking the medication.   Blood pressure 129/85, 09/2022 Wt Readings from Last 3 Encounters:  11/03/22 197 lb (89.4 kg)  10/03/22 207 lb (93.9 kg)  10/02/22 201 lb (91.2 kg)     Visit Diagnosis:    ICD-10-CM   1. MDD (major depressive disorder), recurrent episode, moderate (HCC)  F33.1     2. Insomnia, unspecified type  G47.00 zolpidem (AMBIEN) 10 MG tablet    3. Generalized anxiety disorder  F41.1 mirtazapine (REMERON) 30 MG tablet  4. Benzodiazepine dependence ([place])  F13.20     5. MDD (major depressive disorder), recurrent episode, mild (HCC)  F33.0 mirtazapine (REMERON) 30 MG tablet      Past Psychiatric History: Please see initial evaluation for full details. I have reviewed the history. No updates at this time.     Past Medical History:  Past Medical History:  Diagnosis Date   Anemia    Anxiety    Arthritis    "all over" (08/12/2018)   Asthma    Back pain    Benign paroxysmal positional vertigo 10/31/2014   Breast cancer, right breast ([place]) dx'd 02/2018   Chronic bronchitis ([place])    Depression    Family history of breast cancer    Family history of prostate cancer     Family history of uterine cancer    GERD (gastroesophageal reflux disease)    History of blood transfusion    "several; related to low blood" (08/12/2018)   History of radiation therapy 11/15/18- 12/17/18   Right Chest wall, SCV, PAB. 25 fractions.    Lymphedema of right arm    Palpitations    Personal history of chemotherapy    Personal history of radiation therapy     Past Surgical History:  Procedure Laterality Date   AXILLARY LYMPH NODE DISSECTION Right 08/12/2018   Procedure: AXILLARY LYMPH NODE DISSECTION;  Surgeon: Alphonsa Overall, MD;  Location: [facility];  Service: General;  Laterality: Right;   BREAST BIOPSY Right 03/2018   BREAST RECONSTRUCTION WITH PLACEMENT OF TISSUE EXPANDER AND FLEX HD (ACELLULAR HYDRATED DERMIS) Right 08/12/2018   Procedure: RIGHT BREAST RECONSTRUCTION WITH PLACEMENT OF TISSUE EXPANDER AND FLEX HD (ACELLULAR HYDRATED DERMIS);  Surgeon: Wallace Going, DO;  Location: [facility];  Service: Plastics;  Laterality: Right;   BREAST REDUCTION SURGERY Left 09/28/2019   Procedure: LEFT MAMMARY REDUCTION/MASTOPEXY  (BREAST);  Surgeon: Wallace Going, DO;  Location: [facility];  Service: Plastics;  Laterality: Left;   DILATION AND CURETTAGE OF UTERUS     ENDOMETRIAL ABLATION     IR IMAGING GUIDED PORT INSERTION  03/18/2018   MASTECTOMY MODIFIED RADICAL Right 08/12/2018   Procedure: RIGHT MODIFIED RADICAL MASTECTOMY;  Surgeon: Alphonsa Overall, MD;  Location: [facility];  Service: General;  Laterality: Right;   MASTECTOMY MODIFIED RADICAL Right 08/12/2018   w/axillary LND   MYOMECTOMY     REDUCTION MAMMAPLASTY Left 2021   REMOVAL OF TISSUE EXPANDER AND PLACEMENT OF IMPLANT Right 10/26/2018   Procedure: Removal of right breast expander;  Surgeon: Wallace Going, DO;  Location: [facility];  Service: Plastics;  Laterality: Right;  75 min, please    Family Psychiatric History: Please see initial evaluation for full details. I have reviewed the history. No updates at this  time.     Family History:  Family History  Problem Relation Age of Onset   Lupus Sister    Heart disease Sister    Breast cancer Mother 55   Prostate cancer Paternal Uncle    Diabetes Maternal Grandmother    Heart Problems Maternal Grandmother    Prostate cancer Maternal Grandfather    Prostate cancer Paternal Grandfather    Prostate cancer Other        MGFs brother   Breast cancer Other        Mat great-grandmother's sister   Uterine cancer Maternal Great-grandmother    Prostate cancer Other        PGFs brother    Social History:  Social History  Socioeconomic History   Marital status: Single    Spouse name: Not on file   Number of children: Not on file   Years of education: Not on file   Highest education level: Not on file  Occupational History   Not on file  Tobacco Use   Smoking status: Former    Packs/day: 1.00    Years: 38.00    Additional pack years: 0.00    Total pack years: 38.00    Types: Cigarettes    Quit date: 05/15/2018    Years since quitting: 4.4   Smokeless tobacco: Never  Vaping Use   Vaping Use: Never used  Substance and Sexual Activity   Alcohol use: Not Currently    Comment: occasional drink   Drug use: No   Sexual activity: Not Currently    Birth control/protection: None  Other Topics Concern   Not on file  Social History Narrative   Right Handed   Lives in a one story home   Drinks a cup of coffee every so often    Social Determinants of Health   Financial Resource Strain: Not on file  Food Insecurity: Not on file  Transportation Needs: No Transportation Needs (09/07/2018)   PRAPARE - Hydrologist (Medical): No    Lack of Transportation (Non-Medical): No  Physical Activity: Not on file  Stress: Not on file  Social Connections: Not on file    Allergies:  Allergies  Allergen Reactions   Gadolinium Derivatives Itching and Cough    Pt began sneezing and coughing as soon as MRI contrast was  injected. Severe nasal congestion. No rash or hives no SOB.    Hydrocodone Hives and Itching    Pt says she can take it with benadryl.    Iodinated Contrast Media Itching    Metabolic Disorder Labs: Lab Results  Component Value Date   HGBA1C 5.1 11/27/2021   No results found for: "PROLACTIN" Lab Results  Component Value Date   CHOL 195 11/27/2021   TRIG 136 11/27/2021   HDL 44 11/27/2021   CHOLHDL 3.2 02/18/2019   VLDL 23 02/18/2019   LDLCALC 127 (H) 11/27/2021   LDLCALC 93 03/21/2021   Lab Results  Component Value Date   TSH 2.200 03/21/2021   TSH 0.58 09/24/2016    Therapeutic Level Labs: No results found for: "LITHIUM" No results found for: "VALPROATE" No results found for: "CBMZ"  Current Medications: Current Outpatient Medications  Medication Sig Dispense Refill   Albuterol Sulfate (PROAIR RESPICLICK) [id number] (90 Base) MCG/ACT AEPB Inhale 2 puffs into the lungs every 4 (four) hours as needed. 1 each 1   [START ON 11/19/2022] ALPRAZolam (XANAX) 1 MG tablet May take 1 tablet (1 mg total) by mouth 2 (two) times daily as needed for anxiety. May also take 0.5 tablets (0.5 mg total) daily as needed for anxiety. 75 tablet 1   buPROPion (WELLBUTRIN SR) 150 MG 12 hr tablet Take 1 tablet by mouth daily. 30 tablet 0   chlorthalidone (HYGROTON) 25 MG tablet Take 1 tablet (25 mg total) by mouth daily. 30 tablet 0   Dulaglutide (TRULICITY) 3 0000000 SOPN Inject 3 mg as directed once a week. 2 mL 0   esomeprazole (NEXIUM) 40 MG capsule TAKE 1 CAPSULE (40 MG TOTAL) BY MOUTH DAILY. 30 capsule 2   esomeprazole (NEXIUM) 40 MG capsule Take 1 capsule (40 mg total) by mouth daily. 30 capsule 2   fluticasone-salmeterol (ADVAIR) 100-50 MCG/ACT AEPB Inhale 1  puff into the lungs 2 (two) times daily. 60 each 4   HYDROcodone-acetaminophen (NORCO/VICODIN) 5-325 MG tablet Take 1 tablet by mouth twice a day as needed 10 tablet 0   HYDROcodone-acetaminophen (NORCO/VICODIN) 5-325 MG tablet Take 1 tablet  by mouth twice a day as needed. 10 tablet 0   LATUDA 40 MG TABS tablet Take 1 tablet (40 mg total) by mouth daily with breakfast. 90 tablet 0   loratadine (CLARITIN) 10 MG tablet Take 1 tablet (10 mg total) by mouth daily as needed for allergies. 30 tablet 11   meloxicam (MOBIC) 15 MG tablet Take 1 tablet (15 mg total) by mouth daily. 30 tablet 0   metFORMIN (GLUCOPHAGE) 500 MG tablet Take 1 tablet (500 mg total) by mouth 2 (two) times daily with meals. 60 tablet 0   mirtazapine (REMERON) 30 MG tablet Take 1 tablet (30 mg total) by mouth at bedtime. 90 tablet 0   Multiple Vitamin (MULTIVITAMIN) capsule Take 1 capsule by mouth daily.     naloxone (NARCAN) nasal spray 4 mg/0.1 mL Spray 1 spray into 1 nostril every 3 minutes as needed.   Alternate nostrils with each dose until help arrives 2 each 1   omeprazole (PRILOSEC) 40 MG capsule TAKE 1 CAPSULE BY MOUTH DAILY. 30 capsule 2   oxyCODONE-acetaminophen (PERCOCET) 10-325 MG tablet Take 1 tablet by mouth 3 (three) times daily as needed. 45 tablet 0   oxyCODONE-acetaminophen (PERCOCET) 10-325 MG tablet Take 1 tablet by mouth 4 times daily as needed. 120 tablet 0   oxyCODONE-acetaminophen (PERCOCET) 10-325 MG tablet Take 1 tablet by mouth 4 times daily, as needed 10/26/22 120 tablet 0   potassium chloride (KLOR-CON M) 10 MEQ tablet Take 1 tablet by mouth with food once daily 10 tablet 0   propranolol (INDERAL) 40 MG tablet Take 40 mg by mouth daily as needed (heart palpitation.).      topiramate (TOPAMAX) 50 MG tablet Take 1.5 tablets (75 mg total) by mouth at bedtime. 45 tablet 5   venlafaxine XR (EFFEXOR-XR) 75 MG 24 hr capsule Take 3 capsules (225 mg total) by mouth daily with breakfast. 270 capsule 1   Vitamin D, Ergocalciferol, (DRISDOL) 1.25 MG (50000 UNIT) CAPS capsule Take 1 capsule by mouth every 7 days. 5 capsule 0   [START ON 11/13/2022] zolpidem (AMBIEN) 10 MG tablet Take 1 tablet (10 mg total) by mouth at bedtime as needed. 30 tablet 1   No  current facility-administered medications for this visit.     Musculoskeletal: Strength & Muscle Tone:  N/A Gait & Station:  N/A Patient leans: N/A  Psychiatric Specialty Exam: Review of Systems  Psychiatric/Behavioral:  Positive for dysphoric mood and sleep disturbance. Negative for agitation, behavioral problems, confusion, decreased concentration, hallucinations, self-injury and suicidal ideas. The patient is nervous/anxious. The patient is not hyperactive.   All other systems reviewed and are negative.   There were no vitals taken for this visit.There is no height or weight on file to calculate BMI.  General Appearance: Fairly Groomed  Eye Contact:  Good  Speech:  Clear and Coherent  Volume:  Normal  Mood:   depressed  Affect:  Appropriate, Congruent, and slightly restricted  Thought Process:  Coherent  Orientation:  Full (Time, Place, and Person)  Thought Content: Logical   Suicidal Thoughts:  No  Homicidal Thoughts:  No  Memory:  Immediate;   Good  Judgement:  Good  Insight:  Fair  Psychomotor Activity:  Normal  Concentration:  Concentration: Good  and Attention Span: Good  Recall:  Good  Fund of Knowledge: Good  Language: Good  Akathisia:  No  Handed:  Right  AIMS (if indicated): not done  Assets:  Communication Skills Desire for Improvement  ADL's:  Intact  Cognition: WNL  Sleep:  Poor   Screenings: GAD-7    Flowsheet Row Office Visit from 04/22/2018 in [facility] Office Visit from 06/03/2017 in [facility] Office Visit from 09/24/2016 in [facility] Office Visit from 03/27/2016 in [facility]  Total GAD-7 Score 11 3 15 16       PHQ2-9    Flowsheet Row Video Visit from 09/23/2021 in [facility] Video Visit from 04/09/2021 in [facility] Office  Visit from 03/21/2021 in [facility] Video Visit from 11/07/2020 in [facility] Office Visit from 04/22/2018 in [facility]  PHQ-2 Total Score 4 6 1 4 5   PHQ-9 Total Score 9 11 4 11 15       Flowsheet Row ED from 10/16/2022 in [facility] Video Visit from 01/08/2021 in [facility] No Risk No Risk        Assessment and Plan:  Jacqueline Good is a 62 y.o. year old female with a history of depression, OCD, estrogen negative T2 pN1, stage IIB invasive ductal carcinoma, diagnosed 02/25/2018 s/p mastectomy 08/2018, s/p neoadjuvant chemo immunotherapy/on trastzumab, asthma, OA, chronic back pain, who presents for follow up appointment for below.    2. MDD (major depressive disorder), recurrent episode, moderate ([place]) 3. Generalized anxiety disorder Acute stressors include: brother with substance use  Other stressors include:  father in [place], who struggles with loneliness, unemployment, chronic pain   History: she declines therapy, in person visit   She continues to report depressive symptoms and anxiety, although it has been relatively manageable since the last visit.  Upon reviewing her medication, she has not been taking it has been consistently.  She declined to do so when she is informed that Xanax will be tapered down.  Will continue venlafaxine to target depression and anxiety.  Will continue Latuda as adjunctive treatment for depression/off-label use.   4. Benzodiazepine dependence ([place]) - reduced 11/2022  She reports significant dissatisfaction regarding the plan to taper down Xanax.  Will taper down the dose especially given recent incident of fall. Athough it was reportedly mechanical fall due to uneven floor, she is informed of the long-term risk of tolerance, fall, cognitive impairment and  respiratory suppression with concomitant use of opioid.   1. Insomnia, unspecified type - she declines referral for sleep evaluation Worsening.  Will continue current dose of Ambien at this time to target insomnia.  Discussed potential risk of fall.  Will consider tapering off this medication after she is able to taper off Xanax.    # inattention Unchanged. She has a history of worsening in concentration over the past few years before chemotherapy.  No history of IEP/ADHD.  The etiology is likely multifactorial given her ongoing mood symptoms.  She is not interested in a neuropsychological evaluation.      Plan Continue venlafaxine 225 mg daily - 30 tabs left Continue Latuda 40 mg daily  Continue mirtazapine 30 mg at night -15 tabs  left. She declined to continue this medication Decrease Xanax 1 mg bidprn and 0.5 mg daily prn for anxiety (reduced from 1 mg TID) Continue Ambien 10 mg at night as needed for insomnia Next appointment: 5/29 at 10 am, video. She declined in person visit as she does not drive highway. She asks to have less visit, although she is amenable to contact the clinic for sooner appointment if any worsening in her mood. - on oxycodone 10-325 qid    Past trials of medication: sertraline, fluoxetine, lexapro, Buspar, venlafaxine, duloxetine, bupropion, quetiapine, Abilify, gabapentin   The patient demonstrates the following risk factors for suicide: Chronic risk factors for suicide include: psychiatric disorder of depression and medical illness of breast cancer. Acute risk factors for suicide include: unemployment and loss (financial, interpersonal, professional). Protective factors for this patient include: positive social support, responsibility to others (children, family), coping skills and hope for the future. Considering these factors, the overall suicide risk at this point appears to be low. Patient is appropriate for outpatient follow up.      Collaboration of Care:  Collaboration of Care: Other reviewed notes in Epic  Patient/Guardian was advised Release of Information must be obtained prior to any record release in order to collaborate their care with an outside provider. Patient/Guardian was advised if they have not already done so to contact the registration department to sign all necessary forms in order for [place] to release information regarding their care.   Consent: Patient/Guardian gives verbal consent for treatment and assignment of benefits for services provided during this visit. Patient/Guardian expressed understanding and agreed to proceed.    Norman Clay, MD 11/07/2022, 12:02 PM

## 2022-11-07 ENCOUNTER — Telehealth (INDEPENDENT_AMBULATORY_CARE_PROVIDER_SITE_OTHER): Payer: 59 | Admitting: Psychiatry

## 2022-11-07 ENCOUNTER — Encounter: Payer: Self-pay | Admitting: Psychiatry

## 2022-11-07 ENCOUNTER — Other Ambulatory Visit (HOSPITAL_COMMUNITY): Payer: Self-pay

## 2022-11-07 DIAGNOSIS — F330 Major depressive disorder, recurrent, mild: Secondary | ICD-10-CM

## 2022-11-07 DIAGNOSIS — F331 Major depressive disorder, recurrent, moderate: Secondary | ICD-10-CM | POA: Diagnosis not present

## 2022-11-07 DIAGNOSIS — F1320 Sedative, hypnotic or anxiolytic dependence, uncomplicated: Secondary | ICD-10-CM

## 2022-11-07 DIAGNOSIS — G4700 Insomnia, unspecified: Secondary | ICD-10-CM

## 2022-11-07 DIAGNOSIS — F411 Generalized anxiety disorder: Secondary | ICD-10-CM | POA: Diagnosis not present

## 2022-11-07 MED ORDER — ALPRAZOLAM 1 MG PO TABS
ORAL_TABLET | ORAL | 1 refills | Status: DC
  Filled 2022-11-19: qty 75, 30d supply, fill #0

## 2022-11-07 MED ORDER — ZOLPIDEM TARTRATE 10 MG PO TABS
10.0000 mg | ORAL_TABLET | Freq: Every evening | ORAL | 1 refills | Status: DC | PRN
  Filled 2022-11-07 – 2022-11-13 (×2): qty 30, 30d supply, fill #0
  Filled 2023-01-08: qty 30, 30d supply, fill #1

## 2022-11-07 MED ORDER — VENLAFAXINE HCL ER 75 MG PO CP24
225.0000 mg | ORAL_CAPSULE | Freq: Every day | ORAL | 1 refills | Status: DC
  Filled 2022-11-07: qty 270, 90d supply, fill #0

## 2022-11-07 MED ORDER — MIRTAZAPINE 30 MG PO TABS
30.0000 mg | ORAL_TABLET | Freq: Every day | ORAL | 0 refills | Status: DC
  Filled 2022-11-07: qty 90, 90d supply, fill #0

## 2022-11-07 NOTE — Patient Instructions (Signed)
Continue venlafaxine 225 mg daily  Continue latuda 40 mg daily  Continue mirtazapine 30 mg at night  Decrease Xanax 1 mg twice a day and 0.5 mg once a day as needed for anxiety Continue Ambien 10 mg at night as needed for insomnia Next appointment: 5/29 at 10 am, video

## 2022-11-13 ENCOUNTER — Other Ambulatory Visit (HOSPITAL_COMMUNITY): Payer: Self-pay

## 2022-11-13 DIAGNOSIS — M25562 Pain in left knee: Secondary | ICD-10-CM | POA: Diagnosis not present

## 2022-11-13 DIAGNOSIS — M25512 Pain in left shoulder: Secondary | ICD-10-CM | POA: Diagnosis not present

## 2022-11-13 DIAGNOSIS — M25561 Pain in right knee: Secondary | ICD-10-CM | POA: Diagnosis not present

## 2022-11-13 DIAGNOSIS — M25511 Pain in right shoulder: Secondary | ICD-10-CM | POA: Diagnosis not present

## 2022-11-13 DIAGNOSIS — M542 Cervicalgia: Secondary | ICD-10-CM | POA: Diagnosis not present

## 2022-11-18 ENCOUNTER — Other Ambulatory Visit (HOSPITAL_COMMUNITY): Payer: Self-pay

## 2022-11-18 ENCOUNTER — Other Ambulatory Visit: Payer: Self-pay

## 2022-11-18 DIAGNOSIS — M542 Cervicalgia: Secondary | ICD-10-CM | POA: Diagnosis not present

## 2022-11-18 DIAGNOSIS — M25512 Pain in left shoulder: Secondary | ICD-10-CM | POA: Diagnosis not present

## 2022-11-18 DIAGNOSIS — M25511 Pain in right shoulder: Secondary | ICD-10-CM | POA: Diagnosis not present

## 2022-11-18 DIAGNOSIS — M25562 Pain in left knee: Secondary | ICD-10-CM | POA: Diagnosis not present

## 2022-11-18 DIAGNOSIS — M25561 Pain in right knee: Secondary | ICD-10-CM | POA: Diagnosis not present

## 2022-11-19 ENCOUNTER — Other Ambulatory Visit (HOSPITAL_COMMUNITY): Payer: Self-pay

## 2022-11-19 ENCOUNTER — Other Ambulatory Visit: Payer: Self-pay

## 2022-11-20 DIAGNOSIS — M170 Bilateral primary osteoarthritis of knee: Secondary | ICD-10-CM | POA: Diagnosis not present

## 2022-11-20 DIAGNOSIS — M19012 Primary osteoarthritis, left shoulder: Secondary | ICD-10-CM | POA: Diagnosis not present

## 2022-11-20 DIAGNOSIS — M19011 Primary osteoarthritis, right shoulder: Secondary | ICD-10-CM | POA: Diagnosis not present

## 2022-11-25 DIAGNOSIS — Z79899 Other long term (current) drug therapy: Secondary | ICD-10-CM | POA: Diagnosis not present

## 2022-11-25 DIAGNOSIS — M545 Low back pain, unspecified: Secondary | ICD-10-CM | POA: Diagnosis not present

## 2022-11-25 DIAGNOSIS — G8929 Other chronic pain: Secondary | ICD-10-CM | POA: Diagnosis not present

## 2022-11-26 ENCOUNTER — Other Ambulatory Visit (HOSPITAL_COMMUNITY): Payer: Self-pay

## 2022-11-26 DIAGNOSIS — Z79899 Other long term (current) drug therapy: Secondary | ICD-10-CM | POA: Diagnosis not present

## 2022-11-26 MED ORDER — OXYCODONE-ACETAMINOPHEN 10-325 MG PO TABS
1.0000 | ORAL_TABLET | Freq: Four times a day (QID) | ORAL | 0 refills | Status: DC | PRN
  Filled 2022-11-26: qty 120, 30d supply, fill #0

## 2022-11-26 MED ORDER — NALOXONE HCL 4 MG/0.1ML NA LIQD
NASAL | 1 refills | Status: AC
  Filled 2022-11-26: qty 2, 1d supply, fill #0

## 2022-11-28 DIAGNOSIS — Z79899 Other long term (current) drug therapy: Secondary | ICD-10-CM | POA: Diagnosis not present

## 2022-12-08 ENCOUNTER — Encounter: Payer: Self-pay | Admitting: Bariatrics

## 2022-12-08 ENCOUNTER — Other Ambulatory Visit (HOSPITAL_COMMUNITY): Payer: Self-pay

## 2022-12-08 ENCOUNTER — Ambulatory Visit (INDEPENDENT_AMBULATORY_CARE_PROVIDER_SITE_OTHER): Payer: 59 | Admitting: Bariatrics

## 2022-12-08 VITALS — BP 105/73 | HR 81 | Temp 97.9°F | Ht 64.0 in | Wt 188.0 lb

## 2022-12-08 DIAGNOSIS — F3289 Other specified depressive episodes: Secondary | ICD-10-CM

## 2022-12-08 DIAGNOSIS — E88819 Insulin resistance, unspecified: Secondary | ICD-10-CM | POA: Diagnosis not present

## 2022-12-08 DIAGNOSIS — E559 Vitamin D deficiency, unspecified: Secondary | ICD-10-CM

## 2022-12-08 DIAGNOSIS — R632 Polyphagia: Secondary | ICD-10-CM

## 2022-12-08 DIAGNOSIS — E669 Obesity, unspecified: Secondary | ICD-10-CM

## 2022-12-08 DIAGNOSIS — I10 Essential (primary) hypertension: Secondary | ICD-10-CM

## 2022-12-08 DIAGNOSIS — Z6832 Body mass index (BMI) 32.0-32.9, adult: Secondary | ICD-10-CM

## 2022-12-08 MED ORDER — BUPROPION HCL ER (SR) 150 MG PO TB12
150.0000 mg | ORAL_TABLET | Freq: Every day | ORAL | 0 refills | Status: DC
  Filled 2022-12-08: qty 30, 30d supply, fill #0

## 2022-12-08 MED ORDER — CHLORTHALIDONE 25 MG PO TABS
25.0000 mg | ORAL_TABLET | Freq: Every day | ORAL | 0 refills | Status: DC
  Filled 2022-12-08: qty 30, 30d supply, fill #0

## 2022-12-08 MED ORDER — TRULICITY 3 MG/0.5ML ~~LOC~~ SOAJ
3.0000 mg | SUBCUTANEOUS | 0 refills | Status: DC
  Filled 2022-12-08: qty 2, 28d supply, fill #0

## 2022-12-08 MED ORDER — METFORMIN HCL 500 MG PO TABS
500.0000 mg | ORAL_TABLET | Freq: Two times a day (BID) | ORAL | 0 refills | Status: DC
  Filled 2022-12-08: qty 60, 30d supply, fill #0

## 2022-12-08 MED ORDER — VITAMIN D (ERGOCALCIFEROL) 1.25 MG (50000 UNIT) PO CAPS
50000.0000 [IU] | ORAL_CAPSULE | ORAL | 0 refills | Status: DC
  Filled 2022-12-08: qty 5, 35d supply, fill #0

## 2022-12-09 ENCOUNTER — Other Ambulatory Visit (HOSPITAL_COMMUNITY): Payer: Self-pay

## 2022-12-09 ENCOUNTER — Encounter: Payer: Self-pay | Admitting: Bariatrics

## 2022-12-09 NOTE — Progress Notes (Signed)
Chief Complaint:   OBESITY Jacqueline Good is here to discuss her progress with her obesity treatment plan along with follow-up of her obesity related diagnoses. Jacqueline Good is on the Category 1 Plan and states she is following her eating plan approximately 60% of the time. Jacqueline Good states she is doing physical therapy for 45 minutes 4 times per week.  Today's visit was #: 23 Starting weight: 238 lbs Starting date: 03/21/2021 Today's weight: 188 lbs Today's date: 12/08/2022 Total lbs lost to date: 50 Total lbs lost since last in-office visit: 9  Interim History: [place] os down 9 lbs since her last visit. She is down a total of 50 lbs. She is getting adequate fruits and vegetables.   Subjective:   1. Essential hypertension Jacqueline Good's blood pressure is well controlled.   2. Polyphagia Jacqueline Good notes Trulicity helps with her appetite and insulin resistance.   3. Insulin resistance Jacqueline Good is taking metformin.  4. Vitamin D deficiency Jacqueline Good is taking Vitamin D as directed. She notes minimal fatigue.   5. Other depression, emotional eating Jacqueline Good is taking as directed.   Assessment/Plan:   1. Essential hypertension We will refill chlorthalidone 25 mg once daily for 1 month.   - chlorthalidone (HYGROTON) 25 MG tablet; Take 1 tablet (25 mg total) by mouth daily.  Dispense: 30 tablet; Refill: 0  2. Polyphagia We will refill Trulicity and metformin for 1 month.   - Dulaglutide (TRULICITY) 3 MG/0.5ML SOPN; Inject 3 mg as directed once a week.  Dispense: 2 mL; Refill: 0 - metFORMIN (GLUCOPHAGE) 500 MG tablet; Take 1 tablet (500 mg total) by mouth 2 (two) times daily with meals.  Dispense: 60 tablet; Refill: 0  3. Insulin resistance We will refill metformin for 1 month.   - metFORMIN (GLUCOPHAGE) 500 MG tablet; Take 1 tablet (500 mg total) by mouth 2 (two) times daily with meals.  Dispense: 60 tablet; Refill: 0  4. Vitamin D deficiency We will refill prescription Vitamin D for 1  month.   - Vitamin D, Ergocalciferol, (DRISDOL) 1.25 MG (50000 UNIT) CAPS capsule; Take 1 capsule by mouth every 7 days.  Dispense: 5 capsule; Refill: 0  5. Other depression, emotional eating We will refill Wellbutrin SR for 1 month.   - buPROPion (WELLBUTRIN SR) 150 MG 12 hr tablet; Take 1 tablet by mouth daily.  Dispense: 30 tablet; Refill: 0  6. Generalized obesity  7. BMI 32.0-32.9,adult Jacqueline Good is currently in the action stage of change. As such, her goal is to continue with weight loss efforts. She has agreed to the Category 1 Plan.   Meal planning and intentional eating were discussed. She will keep her water and protein intake high. We will check fasting labs at her next visit.   Exercise goals: As is.   Behavioral modification strategies: increasing lean protein intake, decreasing simple carbohydrates, increasing vegetables, increasing water intake, decreasing eating out, no skipping meals, meal planning and cooking strategies, keeping healthy foods in the home, and planning for success.  Jacqueline Good has agreed to follow-up with our clinic in 4 weeks. She was informed of the importance of frequent follow-up visits to maximize her success with intensive lifestyle modifications for her multiple health conditions.   Objective:   Blood pressure 105/73, pulse 81, temperature 97.9 F (36.6 C), height 5\' 4"  (1.626 m), weight 188 lb (85.3 kg), SpO2 97 %. Body mass index is 32.27 kg/m.  General: Cooperative, alert, well developed, in no acute distress. HEENT: Conjunctivae and lids unremarkable. Cardiovascular:  Regular rhythm.  Lungs: Normal work of breathing. Neurologic: No focal deficits.   Lab Results  Component Value Date   CREATININE 1.02 (H) 12/26/2021   BUN 23 12/26/2021   NA 141 12/26/2021   K 3.8 12/26/2021   CL 105 12/26/2021   CO2 28 12/26/2021   Lab Results  Component Value Date   ALT 12 12/26/2021   AST 14 (L) 12/26/2021   ALKPHOS 78 12/26/2021   BILITOT 0.7  12/26/2021   Lab Results  Component Value Date   HGBA1C 5.1 11/27/2021   HGBA1C 5.5 03/21/2021   HGBA1C 5.4 04/27/2014   Lab Results  Component Value Date   INSULIN 11.7 11/27/2021   INSULIN 31.4 (H) 03/21/2021   Lab Results  Component Value Date   TSH 2.200 03/21/2021   Lab Results  Component Value Date   CHOL 195 11/27/2021   HDL 44 11/27/2021   LDLCALC 127 (H) 11/27/2021   TRIG 136 11/27/2021   CHOLHDL 3.2 02/18/2019   Lab Results  Component Value Date   VD25OH 33.2 11/27/2021   VD25OH 14.4 (L) 03/21/2021   Lab Results  Component Value Date   WBC 6.4 12/26/2021   HGB 12.9 12/26/2021   HCT 39.6 12/26/2021   MCV 87.4 12/26/2021   PLT 211 12/26/2021   No results found for: "IRON", "TIBC", "FERRITIN"  Attestation Statements:   Reviewed by clinician on day of visit: allergies, medications, problem list, medical history, surgical history, family history, social history, and previous encounter notes.   Trude Mcburney, am acting as Energy manager for [employer], DO.  I have reviewed the above documentation for accuracy and completeness, and I agree with the above. [name], DO

## 2022-12-10 ENCOUNTER — Telehealth: Payer: Self-pay

## 2022-12-10 NOTE — Addendum Note (Signed)
Addended byIva Lento T on: 12/10/2022 10:16 AM   Modules accepted: Orders, Level of Service

## 2022-12-10 NOTE — Telephone Encounter (Signed)
Left message for patient to return call.

## 2022-12-10 NOTE — Telephone Encounter (Signed)
Patient called in and stated she had some questions from her last appt. Patient would like a call back at [phone]. Patient was seen on 12/08/22 with Dr. Manson Passey.

## 2022-12-10 NOTE — Telephone Encounter (Signed)
This encounter was created in error - please disregard.

## 2022-12-11 NOTE — Telephone Encounter (Signed)
Patient called in and stated she was 188 on our scale and got on another was 192. I notified patient that our scales take off 3lbs for clothing so a regular scale may read a little heaver. Patient verbalized understanding.

## 2022-12-24 ENCOUNTER — Other Ambulatory Visit (HOSPITAL_COMMUNITY): Payer: Self-pay

## 2022-12-24 DIAGNOSIS — G8929 Other chronic pain: Secondary | ICD-10-CM | POA: Diagnosis not present

## 2022-12-24 DIAGNOSIS — R030 Elevated blood-pressure reading, without diagnosis of hypertension: Secondary | ICD-10-CM | POA: Diagnosis not present

## 2022-12-24 DIAGNOSIS — M545 Low back pain, unspecified: Secondary | ICD-10-CM | POA: Diagnosis not present

## 2022-12-24 DIAGNOSIS — Z79899 Other long term (current) drug therapy: Secondary | ICD-10-CM | POA: Diagnosis not present

## 2022-12-24 MED ORDER — OXYCODONE-ACETAMINOPHEN 10-325 MG PO TABS
1.0000 | ORAL_TABLET | Freq: Four times a day (QID) | ORAL | 0 refills | Status: AC | PRN
  Filled 2022-12-26: qty 120, 30d supply, fill #0

## 2022-12-26 ENCOUNTER — Other Ambulatory Visit: Payer: Self-pay

## 2022-12-26 ENCOUNTER — Telehealth: Payer: Self-pay | Admitting: Adult Health

## 2022-12-26 ENCOUNTER — Other Ambulatory Visit (HOSPITAL_COMMUNITY): Payer: Self-pay

## 2022-12-26 DIAGNOSIS — Z79899 Other long term (current) drug therapy: Secondary | ICD-10-CM | POA: Diagnosis not present

## 2022-12-26 NOTE — Telephone Encounter (Signed)
Rescheduled appointments per incoming call. Patient is aware of the changes made to her upcoming appointments.

## 2022-12-27 ENCOUNTER — Other Ambulatory Visit (HOSPITAL_COMMUNITY): Payer: Self-pay

## 2022-12-29 ENCOUNTER — Inpatient Hospital Stay: Payer: 59

## 2022-12-29 ENCOUNTER — Inpatient Hospital Stay: Payer: 59 | Admitting: Adult Health

## 2023-01-03 NOTE — Progress Notes (Unsigned)
Virtual Visit via Video Note  I connected with Jacqueline Good on 01/06/23 at  3:30 PM EDT by a video enabled telemedicine application and verified that I am speaking with the correct person using two identifiers.  Location: Patient: home Provider: office Persons participated in the visit- patient, provider    I discussed the limitations of evaluation and management by telemedicine and the availability of in person appointments. The patient expressed understanding and agreed to proceed.     I discussed the assessment and treatment plan with the patient. The patient was provided an opportunity to ask questions and all were answered. The patient agreed with the plan and demonstrated an understanding of the instructions.   The patient was advised to call back or seek an in-person evaluation if the symptoms worsen or if the condition fails to improve as anticipated.  I provided 20 minutes of non-face-to-face time during this encounter.   Neysa Hotter, MD    [facility] MD/PA/NP OP Progress Note  01/06/2023 4:20 PM Jacqueline Good  MRN:  [id number]  Chief Complaint:  Chief Complaint  Patient presents with   Follow-up   HPI:  This is a follow-up appointment for depression, anxiety and insomnia.  She states that she is not doing good.  However, she does not want to talk about.  When she was asked about the reason, she states that she was guarded as this Clinical research associate used things against her at the previous visit.  She states that she was told she was overmedicated by this Clinical research associate.  When this writer tried to convey the intent of reducing his dose of Xanax, which was to reduce the risk of fall, she adamantly denied that this Clinical research associate had said it and insisted that she was overmedicated. She ruminated on this.  She has been stressed due to noises.  And her stress level is high due to finances.  However, she does not want to talk about this as this Clinical research associate will use it against her.  She states that she just need a  medication, and she is not interested in therapy.  When informed that I need to know what is going on to safely prescribe the medication, she stated, 'You are not doing me any good then.' When asked if she would like to transfer care, she responded, 'I don't know,' and added, 'We'll see.  She has insomnia.  She has decrease in appetite.  She is working on reducing the weight, and feels good about weight loss.  She denies SI.  She denies alcohol use or drug use.  Despite stating that she would discontinue mirtazapine at the previous visit, she has been taking it daily. When asked about this, she responded that she has continued to take it.   Visit Diagnosis:    ICD-10-CM   1. MDD (major depressive disorder), recurrent episode, moderate (HCC)  F33.1     2. Generalized anxiety disorder  F41.1     3. Benzodiazepine dependence (HCC)  F13.20     4. Insomnia, unspecified type  G47.00       Past Psychiatric History: Please see initial evaluation for full details. I have reviewed the history. No updates at this time.     Past Medical History:  Past Medical History:  Diagnosis Date   Anemia    Anxiety    Arthritis    "all over" (08/12/2018)   Asthma    Back pain    Benign paroxysmal positional vertigo 10/31/2014   Breast cancer, right  breast (HCC) dx'd 02/2018   Chronic bronchitis (HCC)    Depression    Family history of breast cancer    Family history of prostate cancer    Family history of uterine cancer    GERD (gastroesophageal reflux disease)    History of blood transfusion    "several; related to low blood" (08/12/2018)   History of radiation therapy 11/15/18- 12/17/18   Right Chest wall, SCV, PAB. 25 fractions.    Lymphedema of right arm    Palpitations    Personal history of chemotherapy    Personal history of radiation therapy     Past Surgical History:  Procedure Laterality Date   AXILLARY LYMPH NODE DISSECTION Right 08/12/2018   Procedure: AXILLARY LYMPH NODE DISSECTION;  Surgeon:  Ovidio Kin, MD;  Location: [facility] OR;  Service: General;  Laterality: Right;   BREAST BIOPSY Right 03/2018   BREAST RECONSTRUCTION WITH PLACEMENT OF TISSUE EXPANDER AND FLEX HD (ACELLULAR HYDRATED DERMIS) Right 08/12/2018   Procedure: RIGHT BREAST RECONSTRUCTION WITH PLACEMENT OF TISSUE EXPANDER AND FLEX HD (ACELLULAR HYDRATED DERMIS);  Surgeon: Peggye Form, DO;  Location: [facility] OR;  Service: Plastics;  Laterality: Right;   BREAST REDUCTION SURGERY Left 09/28/2019   Procedure: LEFT MAMMARY REDUCTION/MASTOPEXY  (BREAST);  Surgeon: Peggye Form, DO;  Location: [facility];  Service: Plastics;  Laterality: Left;   DILATION AND CURETTAGE OF UTERUS     ENDOMETRIAL ABLATION     IR IMAGING GUIDED PORT INSERTION  03/18/2018   MASTECTOMY MODIFIED RADICAL Right 08/12/2018   Procedure: RIGHT MODIFIED RADICAL MASTECTOMY;  Surgeon: Ovidio Kin, MD;  Location: [facility] OR;  Service: General;  Laterality: Right;   MASTECTOMY MODIFIED RADICAL Right 08/12/2018   w/axillary LND   MYOMECTOMY     REDUCTION MAMMAPLASTY Left 2021   REMOVAL OF TISSUE EXPANDER AND PLACEMENT OF IMPLANT Right 10/26/2018   Procedure: Removal of right breast expander;  Surgeon: Peggye Form, DO;  Location: [facility] OR;  Service: Plastics;  Laterality: Right;  75 min, please    Family Psychiatric History: Please see initial evaluation for full details. I have reviewed the history. No updates at this time.     Family History:  Family History  Problem Relation Age of Onset   Lupus Sister    Heart disease Sister    Breast cancer Mother 94   Prostate cancer Paternal Uncle    Diabetes Maternal Grandmother    Heart Problems Maternal Grandmother    Prostate cancer Maternal Grandfather    Prostate cancer Paternal Grandfather    Prostate cancer Other        MGFs brother   Breast cancer Other        Mat great-grandmother's sister   Uterine cancer Maternal Great-grandmother    Prostate cancer Other        PGFs  brother    Social History:  Social History   Socioeconomic History   Marital status: Single    Spouse name: Not on file   Number of children: Not on file   Years of education: Not on file   Highest education level: Not on file  Occupational History   Not on file  Tobacco Use   Smoking status: Former    Packs/day: 1.00    Years: 38.00    Additional pack years: 0.00    Total pack years: 38.00    Types: Cigarettes    Quit date: 05/15/2018    Years since quitting: 4.6   Smokeless tobacco:  Never  Vaping Use   Vaping Use: Never used  Substance and Sexual Activity   Alcohol use: Not Currently    Comment: occasional drink   Drug use: No   Sexual activity: Not Currently    Birth control/protection: None  Other Topics Concern   Not on file  Social History Narrative   Right Handed   Lives in a one story home   Drinks a cup of coffee every so often    Social Determinants of Health   Financial Resource Strain: Not on file  Food Insecurity: Not on file  Transportation Needs: No Transportation Needs (09/07/2018)   PRAPARE - Administrator, Civil Service (Medical): No    Lack of Transportation (Non-Medical): No  Physical Activity: Not on file  Stress: Not on file  Social Connections: Not on file    Allergies:  Allergies  Allergen Reactions   Gadolinium Derivatives Itching and Cough    Pt began sneezing and coughing as soon as MRI contrast was injected. Severe nasal congestion. No rash or hives no SOB.    Hydrocodone Hives and Itching    Pt says she can take it with benadryl.    Iodinated Contrast Media Itching    Metabolic Disorder Labs: Lab Results  Component Value Date   HGBA1C 5.1 11/27/2021   No results found for: "PROLACTIN" Lab Results  Component Value Date   CHOL 195 11/27/2021   TRIG 136 11/27/2021   HDL 44 11/27/2021   CHOLHDL 3.2 02/18/2019   VLDL 23 02/18/2019   LDLCALC 127 (H) 11/27/2021   LDLCALC 93 03/21/2021   Lab Results   Component Value Date   TSH 2.200 03/21/2021   TSH 0.58 09/24/2016    Therapeutic Level Labs: No results found for: "LITHIUM" No results found for: "VALPROATE" No results found for: "CBMZ"  Current Medications: Current Outpatient Medications  Medication Sig Dispense Refill   Albuterol Sulfate (PROAIR RESPICLICK) 108 (90 Base) MCG/ACT AEPB Inhale 2 puffs into the lungs every 4 (four) hours as needed. 1 each 1   [START ON 01/18/2023] ALPRAZolam (XANAX) 1 MG tablet May take 1 tablet (1 mg total) by mouth 2 (two) times daily as needed for anxiety. May also take 0.5 tablets (0.5 mg total) daily as needed for anxiety. 75 tablet 1   buPROPion (WELLBUTRIN SR) 150 MG 12 hr tablet Take 1 tablet by mouth daily. 30 tablet 0   chlorthalidone (HYGROTON) 25 MG tablet Take 1 tablet (25 mg total) by mouth daily. 30 tablet 0   Dulaglutide (TRULICITY) 3 MG/0.5ML SOPN Inject 3 mg as directed once a week. 2 mL 0   esomeprazole (NEXIUM) 40 MG capsule TAKE 1 CAPSULE (40 MG TOTAL) BY MOUTH DAILY. 30 capsule 2   esomeprazole (NEXIUM) 40 MG capsule Take 1 capsule (40 mg total) by mouth daily. 30 capsule 2   fluticasone-salmeterol (ADVAIR) 100-50 MCG/ACT AEPB Inhale 1 puff into the lungs 2 (two) times daily. 60 each 4   HYDROcodone-acetaminophen (NORCO/VICODIN) 5-325 MG tablet Take 1 tablet by mouth twice a day as needed 10 tablet 0   HYDROcodone-acetaminophen (NORCO/VICODIN) 5-325 MG tablet Take 1 tablet by mouth twice a day as needed. 10 tablet 0   LATUDA 40 MG TABS tablet Take 1 tablet (40 mg total) by mouth daily with breakfast. 90 tablet 0   loratadine (CLARITIN) 10 MG tablet Take 1 tablet (10 mg total) by mouth daily as needed for allergies. 30 tablet 11   meloxicam (MOBIC) 15 MG  tablet Take 1 tablet (15 mg total) by mouth daily. 30 tablet 0   metFORMIN (GLUCOPHAGE) 500 MG tablet Take 1 tablet (500 mg total) by mouth 2 (two) times daily with meals. 60 tablet 0   mirtazapine (REMERON) 30 MG tablet Take 1 tablet  (30 mg total) by mouth at bedtime. 90 tablet 0   Multiple Vitamin (MULTIVITAMIN) capsule Take 1 capsule by mouth daily.     naloxone (NARCAN) nasal spray 4 mg/0.1 mL Spray 1 spray into 1 nostril every 3 minutes as needed.   Alternate nostrils with each dose until help arrives 2 each 1   naloxone (NARCAN) nasal spray 4 mg/0.1 mL Spray 1 dose into ONE nostril every 3 minutes alternate nostrils with each dose until help arrives 2 each 1   omeprazole (PRILOSEC) 40 MG capsule TAKE 1 CAPSULE BY MOUTH DAILY. 30 capsule 2   oxyCODONE-acetaminophen (PERCOCET) 10-325 MG tablet Take 1 tablet by mouth 3 (three) times daily as needed. 45 tablet 0   oxyCODONE-acetaminophen (PERCOCET) 10-325 MG tablet Take 1 tablet by mouth 4 times daily as needed. 120 tablet 0   oxyCODONE-acetaminophen (PERCOCET) 10-325 MG tablet Take 1 tablet by mouth 4 times daily, as needed 10/26/22 120 tablet 0   oxyCODONE-acetaminophen (PERCOCET) 10-325 MG tablet Take 1 tablet by mouth 4 (four) times daily as needed. 120 tablet 0   potassium chloride (KLOR-CON M) 10 MEQ tablet Take 1 tablet by mouth with food once daily 10 tablet 0   propranolol (INDERAL) 40 MG tablet Take 40 mg by mouth daily as needed (heart palpitation.).      topiramate (TOPAMAX) 50 MG tablet Take 1.5 tablets (75 mg total) by mouth at bedtime. 45 tablet 5   venlafaxine XR (EFFEXOR-XR) 75 MG 24 hr capsule Take 3 capsules (225 mg total) by mouth daily with breakfast. 270 capsule 1   Vitamin D, Ergocalciferol, (DRISDOL) 1.25 MG (50000 UNIT) CAPS capsule Take 1 capsule by mouth every 7 days. 5 capsule 0   zolpidem (AMBIEN) 10 MG tablet Take 1 tablet (10 mg total) by mouth at bedtime as needed. 30 tablet 1   No current facility-administered medications for this visit.     Musculoskeletal: Strength & Muscle Tone:  N/A Gait & Station:  N/A Patient leans: N/A  Psychiatric Specialty Exam: Review of Systems  Psychiatric/Behavioral:  Positive for dysphoric mood and sleep  disturbance. Negative for agitation, behavioral problems, confusion, decreased concentration, hallucinations, self-injury and suicidal ideas. The patient is nervous/anxious. The patient is not hyperactive.   All other systems reviewed and are negative.   There were no vitals taken for this visit.There is no height or weight on file to calculate BMI.  General Appearance: Fairly Groomed  Eye Contact:  Good  Speech:  Clear and Coherent, loud  Volume:  Normal  Mood:  Anxious  Affect:  Appropriate, Congruent, and irritable  Thought Process:  Coherent  Orientation:  Full (Time, Place, and Person)  Thought Content: Logical   Suicidal Thoughts:  No  Homicidal Thoughts:  No  Memory:  Immediate;   Good  Judgement:  Fair  Insight:  Shallow  Psychomotor Activity:  Normal  Concentration:  Concentration: Good and Attention Span: Good  Recall:  Good  Fund of Knowledge: Good  Language: Good  Akathisia:  No  Handed:  Right  AIMS (if indicated): not done  Assets:  Communication Skills Desire for Improvement  ADL's:  Intact  Cognition: WNL  Sleep:  Poor   Screenings: GAD-7  Flowsheet Row Office Visit from 04/22/2018 in [facility] Office Visit from 06/03/2017 in [facility] Office Visit from 09/24/2016 in [facility] Office Visit from 03/27/2016 in [facility]  Total GAD-7 Score 11 3 15 16       PHQ2-9    Flowsheet Row Video Visit from 09/23/2021 in [facility] Video Visit from 04/09/2021 in [facility] Office Visit from 03/21/2021 in [facility] Video Visit from 11/07/2020 in [facility] Office Visit from 04/22/2018 in [facility]  PHQ-2 Total Score 4 6 1 4 5   PHQ-9  Total Score 9 11 4 11 15       Flowsheet Row ED from 10/16/2022 in [facility] Video Visit from 01/08/2021 in [facility]  C-SSRS RISK CATEGORY No Risk No Risk        Assessment and Plan:  Jacqueline Good is a 62 y.o. year old female with a history of depression, OCD, estrogen negative T2 pN1, stage IIB invasive ductal carcinoma, diagnosed 02/25/2018 s/p mastectomy 08/2018, s/p neoadjuvant chemo immunotherapy/on trastzumab, asthma, OA, chronic back pain, who presents for follow up appointment for below.   1. MDD (major depressive disorder), recurrent episode, moderate (HCC) 2. Generalized anxiety disorder Acute stressors include: brother with substance use  Other stressors include:  father in [place], who struggles with loneliness, unemployment, chronic pain   History: Tx from [facility], she declines therapy, in person visit. Originally on venlafaxine 225 mg daily, Buspar 15 mg three times a day, xanax 3.5 mg per day, Ambien 10 mg at night,    Exam is notable for irritability, and she declined to elaborate regarding her psychosocial stressors.  Discussed with the patient the limitations of safely prescribing medications without fully understanding her situation. Also discussed the option of transferring to another practice due to this challenge and difficulty in establishing therapeutic alliance. She expressed understanding and agreed to consider this until the next visit, as she is unsure of what she wants to do.  Will continue venlafaxine, mirtazapine, Latuda to target depression.    3. Benzodiazepine dependence (HCC) - reduced from 1 mg TID 11/2022  She reports worsening in anxiety in the setting of tapering down Xanax, although she also reports other stressors which she does not disclose.  She denies any fall since the last visit after she had mechanical fall due to uneven floor prior  to this visit.  Will plan to taper down at the  next visit to minimize the risk of fall, tolerance, respiratory suppression with concomitant use of opioid.   4. Insomnia, unspecified type - reduced 11/2022    - she declines referral for sleep evaluation Worsening.  Will continue current dose of Ambien as needed to target insomnia. Discussed potential risk of fall.  Will consider tapering off this medication after she is able to taper off Xanax.    # inattention Unchanged. She has a history of worsening in concentration over the past few years before chemotherapy.  No history of IEP/ADHD.  The etiology is likely multifactorial given her ongoing mood symptoms.  She is not interested in a neuropsychological evaluation.      Plan Continue venlafaxine 225 mg daily - 30 tabs left Continue Latuda 40 mg  daily  Continue mirtazapine 30 mg at night -15 tabs left. She declined to continue this medication Continue Xanax 1 mg bidprn and 0.5 mg daily prn for anxiety  Continue Ambien 10 mg at night as needed for insomnia 4/4- one refill left Next appointment: 7/24 at 10 am, video. She declined in person visit as she does not drive highway.  - on oxycodone 10-325 qid    Past trials of medication: sertraline, fluoxetine, lexapro, Buspar, venlafaxine, duloxetine, bupropion, quetiapine, Abilify, gabapentin   The patient demonstrates the following risk factors for suicide: Chronic risk factors for suicide include: psychiatric disorder of depression and medical illness of breast cancer. Acute risk factors for suicide include: unemployment and loss (financial, interpersonal, professional). Protective factors for this patient include: positive social support, responsibility to others (children, family), coping skills and hope for the future. Considering these factors, the overall suicide risk at this point appears to be low. Patient is appropriate for outpatient follow up.    Collaboration of Care: Collaboration of Care: Other reviewed notes in  Epic  Patient/Guardian was advised Release of Information must be obtained prior to any record release in order to collaborate their care with an outside provider. Patient/Guardian was advised if they have not already done so to contact the registration department to sign all necessary forms in order for [place] to release information regarding their care.   Consent: Patient/Guardian gives verbal consent for treatment and assignment of benefits for services provided during this visit. Patient/Guardian expressed understanding and agreed to proceed.    Neysa Hotter, MD 01/06/2023, 4:20 PM

## 2023-01-06 ENCOUNTER — Encounter: Payer: Self-pay | Admitting: Psychiatry

## 2023-01-06 ENCOUNTER — Other Ambulatory Visit (HOSPITAL_COMMUNITY): Payer: Self-pay

## 2023-01-06 ENCOUNTER — Telehealth (INDEPENDENT_AMBULATORY_CARE_PROVIDER_SITE_OTHER): Payer: 59 | Admitting: Psychiatry

## 2023-01-06 DIAGNOSIS — F411 Generalized anxiety disorder: Secondary | ICD-10-CM | POA: Diagnosis not present

## 2023-01-06 DIAGNOSIS — F1320 Sedative, hypnotic or anxiolytic dependence, uncomplicated: Secondary | ICD-10-CM

## 2023-01-06 DIAGNOSIS — G4700 Insomnia, unspecified: Secondary | ICD-10-CM | POA: Diagnosis not present

## 2023-01-06 DIAGNOSIS — F331 Major depressive disorder, recurrent, moderate: Secondary | ICD-10-CM | POA: Diagnosis not present

## 2023-01-06 MED ORDER — LATUDA 40 MG PO TABS
40.0000 mg | ORAL_TABLET | Freq: Every day | ORAL | 0 refills | Status: DC
  Filled 2023-01-06: qty 90, 90d supply, fill #0

## 2023-01-06 MED ORDER — ALPRAZOLAM 1 MG PO TABS
ORAL_TABLET | ORAL | 1 refills | Status: DC
  Filled 2023-01-19: qty 75, 30d supply, fill #0
  Filled 2023-03-11: qty 75, 30d supply, fill #1

## 2023-01-07 ENCOUNTER — Other Ambulatory Visit (HOSPITAL_COMMUNITY): Payer: Self-pay

## 2023-01-07 ENCOUNTER — Telehealth: Payer: 59 | Admitting: Psychiatry

## 2023-01-08 ENCOUNTER — Encounter: Payer: Self-pay | Admitting: Bariatrics

## 2023-01-08 ENCOUNTER — Ambulatory Visit (INDEPENDENT_AMBULATORY_CARE_PROVIDER_SITE_OTHER): Payer: 59 | Admitting: Bariatrics

## 2023-01-08 ENCOUNTER — Other Ambulatory Visit (HOSPITAL_COMMUNITY): Payer: Self-pay

## 2023-01-08 VITALS — BP 117/74 | HR 66 | Temp 97.8°F | Ht 64.0 in | Wt 187.0 lb

## 2023-01-08 DIAGNOSIS — E559 Vitamin D deficiency, unspecified: Secondary | ICD-10-CM

## 2023-01-08 DIAGNOSIS — E88819 Insulin resistance, unspecified: Secondary | ICD-10-CM

## 2023-01-08 DIAGNOSIS — R632 Polyphagia: Secondary | ICD-10-CM

## 2023-01-08 DIAGNOSIS — E669 Obesity, unspecified: Secondary | ICD-10-CM

## 2023-01-08 DIAGNOSIS — Z6834 Body mass index (BMI) 34.0-34.9, adult: Secondary | ICD-10-CM

## 2023-01-08 DIAGNOSIS — I10 Essential (primary) hypertension: Secondary | ICD-10-CM

## 2023-01-08 DIAGNOSIS — E7800 Pure hypercholesterolemia, unspecified: Secondary | ICD-10-CM

## 2023-01-08 DIAGNOSIS — F3289 Other specified depressive episodes: Secondary | ICD-10-CM

## 2023-01-08 DIAGNOSIS — Z6832 Body mass index (BMI) 32.0-32.9, adult: Secondary | ICD-10-CM

## 2023-01-08 MED ORDER — BUPROPION HCL ER (SR) 150 MG PO TB12
150.0000 mg | ORAL_TABLET | Freq: Every day | ORAL | 0 refills | Status: DC
  Filled 2023-01-08: qty 30, 30d supply, fill #0

## 2023-01-08 MED ORDER — CHLORTHALIDONE 25 MG PO TABS
25.0000 mg | ORAL_TABLET | Freq: Every day | ORAL | 0 refills | Status: DC
  Filled 2023-01-08: qty 30, 30d supply, fill #0

## 2023-01-08 MED ORDER — TRULICITY 3 MG/0.5ML ~~LOC~~ SOAJ
3.0000 mg | SUBCUTANEOUS | 0 refills | Status: DC
  Filled 2023-01-08 – 2023-01-12 (×2): qty 2, 28d supply, fill #0

## 2023-01-08 MED ORDER — METFORMIN HCL 500 MG PO TABS
500.0000 mg | ORAL_TABLET | Freq: Two times a day (BID) | ORAL | 0 refills | Status: DC
  Filled 2023-01-08: qty 60, 30d supply, fill #0

## 2023-01-08 MED ORDER — VITAMIN D (ERGOCALCIFEROL) 1.25 MG (50000 UNIT) PO CAPS
50000.0000 [IU] | ORAL_CAPSULE | ORAL | 0 refills | Status: DC
  Filled 2023-01-08: qty 5, 35d supply, fill #0

## 2023-01-08 NOTE — Progress Notes (Addendum)
WEIGHT SUMMARY AND BIOMETRICS  Weight Lost Since Last Visit: 1lb    Vitals Temp: 97.8 F (36.6 C) BP: 117/74 Pulse Rate: 66 SpO2: 98 %   Anthropometric Measurements Height: 5\' 4"  (1.626 m) Weight: 187 lb (84.8 kg) BMI (Calculated): 32.08 Weight at Last Visit: 188lb Weight Lost Since Last Visit: 1lb Starting Weight: 238lb Total Weight Loss (lbs): 51 lb (23.1 kg)   Body Composition  Body Fat %: 46.6 % Fat Mass (lbs): 87.2 lbs Muscle Mass (lbs): 94.8 lbs Total Body Water (lbs): 72 lbs Visceral Fat Rating : 12   Other Clinical Data Fasting: yes Labs: yes Today's Visit #: 24 Starting Date: 03/21/21    OBESITY Jacqueline Good is here to discuss her progress with her obesity treatment plan along with follow-up of her obesity related diagnoses.     Nutrition Plan: the Category 1 plan - 75% adherence.  Current exercise:  Stretches.  Interim History:  She is down 1 lb since her last visit.  Eating all of the food on the plan., Meeting protein goals., and Water intake is inadequate.  Pharmacotherapy: Jacqueline Good is on Trulicity 3.0 mg SQ weekly and Metformin 500 mg twice daily with meals Adverse side effects: None Hunger is moderately controlled.  Cravings are moderately controlled.  Assessment/Plan:   Insulin resistance  Jacqueline Good has had elevated fasting insulin readings. Goal is HgbA1c < 5.7, fasting insulin at l0 or less, and preferably at 5.  She reports  polyphagia, but improved with medications.  Medication(s): Trulicity and Metformin Lab Results  Component Value Date   HGBA1C 5.1 11/27/2021   Lab Results  Component Value Date   INSULIN 11.7 11/27/2021   INSULIN 31.4 (H) 03/21/2021    Plan Medication(s): Rx: Trulicity 3.0 mg SQ weekly and Metformin 500 mg twice daily with meals ( 1 month supply with no refills.  Will work on the agreed upon plan. Will minimize refined carbohydrates ( sweets and starches), and focus more on complex  carbohydrates.  Increase the micronutrients found in leafy greens, which include magnesium, polyphenols, and vitamin C which have been postulated to help with insulin sensitivity. Minimize "fast food" and cook more meals at home.  Increase fiber to 25 to 30 grams daily.  Information sheet on " Insulin Resistance and Prediabetes".    2.  Polyphagia  She has experienced some hunger, but improved with medications.  Plan:  Continue medications.  Will minimize carbohydrates.   3. Other depression, emotional eating  Jacqueline Good has had issues with stress eating and boredom eating. Currently this is moderately controlled. Overall mood is stable. Denies suicidal/homicidal ideation. Medication(s): Wellbutrin 150 mg daily in the am 1 month supply with no refills.   Plan:  Specifically regarding patient's less desirable eating habits and patterns, we employed the technique of small changes when she cannot fully commit to her prudent nutritional plan. Discussed distractions to curb eating behaviors. Discussed activities to do with one's hands in the evening  Be sure to get adequate rest as lack of rest can trigger appetite.  Have plan in place for stressful events.  Consider other rewards besides food.     4.Hypertension:   Taking medications as directed. Controlled/Uncontrolled.   Plan: Continue medications as directed. No added salt.     5.Elevated Cholesterol:   No medications.    Plan: Will check lipids. Continue medications. Continue diet and exercise.    6.Vitamin D deficiency:   Taking vitamin D prescription/OTC  Plan: Continue vitamin D. Will check vitamin D.  Rx: Vitamin D 50,000 IU 1 weekly # 5 X 0 refills.    Generalized Obesity: Current BMI BMI (Calculated): 32.08   Pharmacotherapy Plan Continue and refill  Trulicity 3.0 mg SQ weekly and Metformin 500 mg twice daily with meals  Jacqueline Good is currently in the action stage of change. As such, her goal is to continue  with weight loss efforts.  She has agreed to the Category 1 plan.  Exercise goals: All adults should avoid inactivity. Some physical activity is better than none, and adults who participate in any amount of physical activity gain some health benefits.  Behavioral modification strategies: better snacking choices and planning for success.  Jacqueline Good has agreed to follow-up with our clinic in 4 weeks.        Objective:   VITALS: Per patient if applicable, see vitals. GENERAL: Alert and in no acute distress. CARDIOPULMONARY: No increased WOB. Speaking in clear sentences.  PSYCH: Pleasant and cooperative. Speech normal rate and rhythm. Affect is appropriate. Insight and judgement are appropriate. Attention is focused, linear, and appropriate.  NEURO: Oriented as arrived to appointment on time with no prompting.   Attestation Statements:   This was prepared with the assistance of Engineer, civil (consulting).  Occasional wrong-word or sound-a-like substitutions may have occurred due to the inherent limitations of voice recognition software.   [name], DO

## 2023-01-09 LAB — COMPREHENSIVE METABOLIC PANEL
ALT: 12 IU/L (ref 0–32)
AST: 14 IU/L (ref 0–40)
Albumin/Globulin Ratio: 1.9 (ref 1.2–2.2)
Albumin: 4.3 g/dL (ref 3.9–4.9)
Alkaline Phosphatase: 79 IU/L (ref 44–121)
BUN/Creatinine Ratio: 25 (ref 12–28)
BUN: 23 mg/dL (ref 8–27)
Bilirubin Total: 0.6 mg/dL (ref 0.0–1.2)
CO2: 23 mmol/L (ref 20–29)
Calcium: 9.4 mg/dL (ref 8.7–10.3)
Chloride: 105 mmol/L (ref 96–106)
Creatinine, Ser: 0.92 mg/dL (ref 0.57–1.00)
Globulin, Total: 2.3 g/dL (ref 1.5–4.5)
Glucose: 77 mg/dL (ref 70–99)
Potassium: 4.3 mmol/L (ref 3.5–5.2)
Sodium: 143 mmol/L (ref 134–144)
Total Protein: 6.6 g/dL (ref 6.0–8.5)
eGFR: 70 mL/min/{1.73_m2} (ref >59)

## 2023-01-09 LAB — LIPID PANEL WITH LDL/HDL RATIO
Cholesterol, Total: 170 mg/dL (ref 100–199)
HDL: 48 mg/dL (ref >39)
LDL Chol Calc (NIH): 102 mg/dL — ABNORMAL HIGH (ref 0–99)
LDL/HDL Ratio: 2.1 ratio (ref 0.0–3.2)
Triglycerides: 109 mg/dL (ref 0–149)
VLDL Cholesterol Cal: 20 mg/dL (ref 5–40)

## 2023-01-09 LAB — TSH+T4F+T3FREE
Free T4: 1.19 ng/dL (ref 0.82–1.77)
T3, Free: 3 pg/mL (ref 2.0–4.4)
TSH: 0.641 u[IU]/mL (ref 0.450–4.500)

## 2023-01-09 LAB — HEMOGLOBIN A1C
Est. average glucose Bld gHb Est-mCnc: 105 mg/dL
Hgb A1c MFr Bld: 5.3 % (ref 4.8–5.6)

## 2023-01-09 LAB — VITAMIN D 25 HYDROXY (VIT D DEFICIENCY, FRACTURES): Vit D, 25-Hydroxy: 58.3 ng/mL (ref 30.0–100.0)

## 2023-01-09 LAB — INSULIN, RANDOM: INSULIN: 9.2 u[IU]/mL (ref 2.6–24.9)

## 2023-01-12 ENCOUNTER — Other Ambulatory Visit (HOSPITAL_COMMUNITY): Payer: Self-pay

## 2023-01-13 ENCOUNTER — Other Ambulatory Visit: Payer: Self-pay

## 2023-01-13 ENCOUNTER — Encounter: Payer: Self-pay | Admitting: Adult Health

## 2023-01-13 ENCOUNTER — Inpatient Hospital Stay: Payer: 59 | Attending: Adult Health

## 2023-01-13 ENCOUNTER — Inpatient Hospital Stay (HOSPITAL_BASED_OUTPATIENT_CLINIC_OR_DEPARTMENT_OTHER): Payer: 59 | Admitting: Adult Health

## 2023-01-13 VITALS — BP 135/83 | HR 71 | Temp 97.7°F | Resp 16 | Ht 64.0 in | Wt 191.3 lb

## 2023-01-13 DIAGNOSIS — C50411 Malignant neoplasm of upper-outer quadrant of right female breast: Secondary | ICD-10-CM

## 2023-01-13 DIAGNOSIS — Z171 Estrogen receptor negative status [ER-]: Secondary | ICD-10-CM

## 2023-01-13 DIAGNOSIS — Z87891 Personal history of nicotine dependence: Secondary | ICD-10-CM | POA: Insufficient documentation

## 2023-01-13 LAB — CBC WITH DIFFERENTIAL (CANCER CENTER ONLY)
Abs Immature Granulocytes: 0.01 10*3/uL (ref 0.00–0.07)
Basophils Absolute: 0 10*3/uL (ref 0.0–0.1)
Basophils Relative: 0 %
Eosinophils Absolute: 0.1 10*3/uL (ref 0.0–0.5)
Eosinophils Relative: 1 %
HCT: 37.9 % (ref 36.0–46.0)
Hemoglobin: 12.7 g/dL (ref 12.0–15.0)
Immature Granulocytes: 0 %
Lymphocytes Relative: 19 %
Lymphs Abs: 1.3 10*3/uL (ref 0.7–4.0)
MCH: 28.6 pg (ref 26.0–34.0)
MCHC: 33.5 g/dL (ref 30.0–36.0)
MCV: 85.4 fL (ref 80.0–100.0)
Monocytes Absolute: 0.5 10*3/uL (ref 0.1–1.0)
Monocytes Relative: 7 %
Neutro Abs: 5 10*3/uL (ref 1.7–7.7)
Neutrophils Relative %: 73 %
Platelet Count: 218 10*3/uL (ref 150–400)
RBC: 4.44 MIL/uL (ref 3.87–5.11)
RDW: 14.2 % (ref 11.5–15.5)
WBC Count: 6.9 10*3/uL (ref 4.0–10.5)
nRBC: 0 % (ref 0.0–0.2)

## 2023-01-13 LAB — CMP (CANCER CENTER ONLY)
ALT: 20 U/L (ref 0–44)
AST: 21 U/L (ref 15–41)
Albumin: 4.2 g/dL (ref 3.5–5.0)
Alkaline Phosphatase: 76 U/L (ref 38–126)
Anion gap: 7 (ref 5–15)
BUN: 19 mg/dL (ref 8–23)
CO2: 27 mmol/L (ref 22–32)
Calcium: 9.7 mg/dL (ref 8.9–10.3)
Chloride: 108 mmol/L (ref 98–111)
Creatinine: 0.95 mg/dL (ref 0.44–1.00)
GFR, Estimated: >60 mL/min (ref >60)
Glucose, Bld: 92 mg/dL (ref 70–99)
Potassium: 4.1 mmol/L (ref 3.5–5.1)
Sodium: 142 mmol/L (ref 135–145)
Total Bilirubin: 0.5 mg/dL (ref 0.3–1.2)
Total Protein: 7 g/dL (ref 6.5–8.1)

## 2023-01-13 NOTE — Progress Notes (Signed)
[facility] Cancer Follow up:    Jacqueline Officer, PA [address] [place] [address]   DIAGNOSIS:  Cancer Staging  Malignant neoplasm of upper-outer quadrant of right breast in female, estrogen receptor negative (HCC) Staging form: Breast, AJCC 8th Edition - Clinical stage from 03/03/2018: Stage IIA (cT30mi, cN1, cM0, G2, ER-, PR-, HER2+) - Unsigned Histologic grading system: 3 grade system Laterality: Right Stage used in treatment planning: Yes National guidelines used in treatment planning: Yes Type of national guideline used in treatment planning: NCCN - Pathologic stage from 08/12/2018: No Stage Recommended (ypT0, pN0, cM0) - Unsigned Stage prefix: Post-therapy   SUMMARY OF ONCOLOGIC HISTORY: Oncology History  Malignant neoplasm of upper-outer quadrant of right breast in female, estrogen receptor negative (HCC)  02/25/2018 Initial Diagnosis   Routine screening mammography revealed right breast abnormality. Diagnostic mammogram and ultrasound revealed a breast mass with a second 0.6 cm lesion. Biopsy ([id number]) revealed IDC, grade 2 arising in the background of high grade DCIS. ER 0%, PR 0%, Ki67 50%. HER2 positive by FISH.    03/23/2018 - 03/31/2019 Chemotherapy   The patient had palonosetron (ALOXI) injection 0.25 mg, 0.25 mg, Intravenous,  Once, 6 of 6 cycles  Administration: 0.25 mg (03/23/2018), 0.25 mg (04/13/2018), 0.25 mg (05/04/2018), 0.25 mg (06/24/2018), 0.25 mg (07/12/2018), 0.25 mg (05/31/2018)    pegfilgrastim (NEULASTA) injection 6 mg, 6 mg, Subcutaneous, Once, 4 of 4 cycles  Administration: 6 mg (03/25/2018), 6 mg (04/15/2018), 6 mg (05/06/2018), 6 mg (07/14/2018)    trastuzumab (HERCEPTIN) 750 mg in sodium chloride 0.9 % 250 mL chemo infusion, 777 mg, Intravenous,  Once, 16 of 16 cycles  Administration: 750 mg (03/23/2018), 600 mg (04/13/2018), 600 mg (05/04/2018), 600 mg (06/24/2018), 600 mg (07/12/2018), 600 mg (08/06/2018), 588 mg  (09/22/2018), 600 mg (12/15/2018), 600 mg (01/28/2019), 600 mg (02/17/2019), 600 mg (05/31/2018), 588 mg (09/01/2018), 600 mg (01/05/2019), 588 mg (10/13/2018), 600 mg (03/10/2019), 600 mg (03/31/2019)    CARBOplatin (PARAPLATIN) 720 mg in sodium chloride 0.9 % 250 mL chemo infusion, 720 mg (100 % of original dose 721 mg), Intravenous,  Once, 6 of 6 cycles  Dose modification:   (original dose 721 mg, Cycle 1)  Administration: 720 mg (03/23/2018), 650 mg (04/13/2018), 670 mg (05/04/2018), 710 mg (06/24/2018), 620 mg (07/12/2018), 710 mg (05/31/2018)    DOCEtaxel (TAXOTERE) 160 mg in sodium chloride 0.9 % 250 mL chemo infusion, 75 mg/m2 = 160 mg, Intravenous,  Once, 3 of 3 cycles  Administration: 160 mg (03/23/2018), 160 mg (04/13/2018), 160 mg (05/04/2018)    gemcitabine (GEMZAR) 1,672 mg in sodium chloride 0.9 % 250 mL chemo infusion, 800 mg/m2 = 1,672 mg (100 % of original dose 800 mg/m2), Intravenous,  Once, 3 of 3 cycles  Dose modification: 800 mg/m2 (original dose 800 mg/m2, Cycle 4, Reason: Provider Judgment)  Administration: 1,672 mg (05/31/2018), 1,672 mg (06/24/2018), 1,672 mg (07/12/2018)    pertuzumab (PERJETA) 840 mg in sodium chloride 0.9 % 250 mL chemo infusion, 840 mg, Intravenous, Once, 1 of 1 cycle  Administration: 840 mg (03/23/2018)    fosaprepitant (EMEND) 150 mg, dexamethasone (DECADRON) 12 mg in sodium chloride 0.9 % 145 mL IVPB, , Intravenous,  Once, 6 of 6 cycles  Administration:  (03/23/2018),  (04/13/2018),  (05/04/2018),  (06/24/2018),  (07/12/2018),  (05/31/2018)  for chemotherapy treatment.     08/12/2018 Surgery   Right modified radical mastectomy. 0/11 lymph nodes positive for carcinoma. Stage ypT0 ypN0 M0.    09/29/2018  Genetic Testing   Negative genetic testing on the common hereditary cancer panel.  The Hereditary Gene Panel offered by [facility] includes sequencing and/or deletion duplication testing of the following 47 genes: APC, ATM, AXIN2, BARD1, BMPR1A, BRCA1, BRCA2,  BRIP1, CDH1, CDK4, CDKN2A (p14ARF), CDKN2A (p16INK4a), CHEK2, CTNNA1, DICER1, EPCAM (Deletion/duplication testing only), GREM1 (promoter region deletion/duplication testing only), KIT, MEN1, MLH1, MSH2, MSH3, MSH6, MUTYH, NBN, NF1, NHTL1, PALB2, PDGFRA, PMS2, POLD1, POLE, PTEN, RAD50, RAD51C, RAD51D, SDHB, SDHC, SDHD, SMAD4, SMARCA4. STK11, TP53, TSC1, TSC2, and VHL.  The following genes were evaluated for sequence changes only: SDHA and HOXB13 c.251G>A variant only. The report date is September 29, 2018.   11/15/2018 - 12/17/2018 Radiation Therapy   The patient initially received a dose of 50 Gy in 25 fractions to the breast using whole-breast tangent fields. This was delivered using a 3-D conformal technique. The patient also received a dose of 50 Gy in 25 fractions to the axilla. The total dose was 100 Gy.      CURRENT THERAPY: observation  INTERVAL HISTORY: Jacqueline Good 62 y.o. female returns for f/u of her history of breast cancer.    Her most recent mammogram occurred on January 22nd 2024 demonstrating no mammographic evidence of malignancy and breast density category C.  She takes percocet for her chronic back pain.  She sees the pain clinic and follows with Dr. Allena Katz.  She also notes that she fell on her face at [employer] back in March.  She underwent x-rays that were negative and this was followed by PT for her shoulder pain.     Patient Active Problem List   Diagnosis Date Noted   BMI 33.0-33.9,adult 11/03/2022   Essential hypertension 08/21/2022   Polyphagia 04/23/2022   Depression 01/08/2022   Chronic superficial gastritis without bleeding 08/06/2021   Gastroduodenitis 08/06/2021   Dysphagia 08/06/2021   Stricture of esophagus 08/06/2021   Personal history of malignant neoplasm of breast 08/06/2021   Insulin resistance 04/26/2021   Vitamin D deficiency 03/26/2021   Postoperative breast asymmetry 09/13/2019   Genetic testing 10/01/2018   Family history of breast cancer     Family history of uterine cancer    Family history of prostate cancer    S/P mastectomy, right 08/20/2018   Acquired absence of right breast 08/20/2018   MDD (major depressive disorder), recurrent episode, moderate (HCC) 06/13/2018   Generalized anxiety disorder 06/13/2018   OCD (obsessive compulsive disorder) 06/13/2018   Insomnia 06/13/2018   Malignant neoplasm of upper-outer quadrant of right breast in female, estrogen receptor negative (HCC) 03/02/2018   Chronic bilateral low back pain without sciatica 06/03/2017   Arthritis 04/27/2014   Fatigue 06/10/2013   Morbid obesity (HCC) 06/10/2013   Asthma, chronic obstructive, with acute exacerbation (HCC) 06/10/2013   Allergy 06/10/2013    is allergic to gadolinium derivatives, hydrocodone, and iodinated contrast media.  MEDICAL HISTORY: Past Medical History:  Diagnosis Date   Anemia    Anxiety    Arthritis    "all over" (08/12/2018)   Asthma    Back pain    Benign paroxysmal positional vertigo 10/31/2014   Breast cancer, right breast (HCC) dx'd 02/2018   Chronic bronchitis (HCC)    Depression    Family history of breast cancer    Family history of prostate cancer    Family history of uterine cancer    GERD (gastroesophageal reflux disease)    History of blood transfusion    "several; related to low blood" (08/12/2018)  History of radiation therapy 11/15/18- 12/17/18   Right Chest wall, SCV, PAB. 25 fractions.    Lymphedema of right arm    Palpitations    Personal history of chemotherapy    Personal history of radiation therapy     SURGICAL HISTORY: Past Surgical History:  Procedure Laterality Date   AXILLARY LYMPH NODE DISSECTION Right 08/12/2018   Procedure: AXILLARY LYMPH NODE DISSECTION;  Surgeon: Ovidio Kin, MD;  Location: [facility] OR;  Service: General;  Laterality: Right;   BREAST BIOPSY Right 03/2018   BREAST RECONSTRUCTION WITH PLACEMENT OF TISSUE EXPANDER AND FLEX HD (ACELLULAR HYDRATED DERMIS) Right 08/12/2018    Procedure: RIGHT BREAST RECONSTRUCTION WITH PLACEMENT OF TISSUE EXPANDER AND FLEX HD (ACELLULAR HYDRATED DERMIS);  Surgeon: Peggye Form, DO;  Location: [facility] OR;  Service: Plastics;  Laterality: Right;   BREAST REDUCTION SURGERY Left 09/28/2019   Procedure: LEFT MAMMARY REDUCTION/MASTOPEXY  (BREAST);  Surgeon: Peggye Form, DO;  Location: [facility];  Service: Plastics;  Laterality: Left;   DILATION AND CURETTAGE OF UTERUS     ENDOMETRIAL ABLATION     IR IMAGING GUIDED PORT INSERTION  03/18/2018   MASTECTOMY MODIFIED RADICAL Right 08/12/2018   Procedure: RIGHT MODIFIED RADICAL MASTECTOMY;  Surgeon: Ovidio Kin, MD;  Location: [facility] OR;  Service: General;  Laterality: Right;   MASTECTOMY MODIFIED RADICAL Right 08/12/2018   w/axillary LND   MYOMECTOMY     REDUCTION MAMMAPLASTY Left 2021   REMOVAL OF TISSUE EXPANDER AND PLACEMENT OF IMPLANT Right 10/26/2018   Procedure: Removal of right breast expander;  Surgeon: Peggye Form, DO;  Location: [facility] OR;  Service: Plastics;  Laterality: Right;  75 min, please    SOCIAL HISTORY: Social History   Socioeconomic History   Marital status: Single    Spouse name: Not on file   Number of children: Not on file   Years of education: Not on file   Highest education level: Not on file  Occupational History   Not on file  Tobacco Use   Smoking status: Former    Packs/day: 1.00    Years: 38.00    Additional pack years: 0.00    Total pack years: 38.00    Types: Cigarettes    Quit date: 05/15/2018    Years since quitting: 4.6   Smokeless tobacco: Never  Vaping Use   Vaping Use: Never used  Substance and Sexual Activity   Alcohol use: Not Currently    Comment: occasional drink   Drug use: No   Sexual activity: Not Currently    Birth control/protection: None  Other Topics Concern   Not on file  Social History Narrative   Right Handed   Lives in a one story home   Drinks a cup of coffee every so often    Social  Determinants of Health   Financial Resource Strain: Not on file  Food Insecurity: Not on file  Transportation Needs: No Transportation Needs (09/07/2018)   PRAPARE - Administrator, Civil Service (Medical): No    Lack of Transportation (Non-Medical): No  Physical Activity: Not on file  Stress: Not on file  Social Connections: Not on file  Intimate Partner Violence: Not At Risk (09/07/2018)   Humiliation, Afraid, Rape, and Kick questionnaire    Fear of Current or Ex-Partner: No    Emotionally Abused: No    Physically Abused: No    Sexually Abused: No    FAMILY HISTORY: Family History  Problem Relation Age of  Onset   Lupus Sister    Heart disease Sister    Breast cancer Mother 37   Prostate cancer Paternal Uncle    Diabetes Maternal Grandmother    Heart Problems Maternal Grandmother    Prostate cancer Maternal Grandfather    Prostate cancer Paternal Grandfather    Prostate cancer Other        MGFs brother   Breast cancer Other        Mat great-grandmother's sister   Uterine cancer Maternal Great-grandmother    Prostate cancer Other        PGFs brother    Review of Systems  Constitutional:  Negative for appetite change, chills, fatigue, fever and unexpected weight change.  HENT:   Negative for hearing loss, lump/mass and trouble swallowing.   Eyes:  Negative for eye problems and icterus.  Respiratory:  Negative for chest tightness, cough and shortness of breath.   Cardiovascular:  Negative for chest pain, leg swelling and palpitations.  Gastrointestinal:  Negative for abdominal distention, abdominal pain, constipation, diarrhea, nausea and vomiting.  Endocrine: Negative for hot flashes.  Genitourinary:  Negative for difficulty urinating.   Musculoskeletal:  Positive for back pain (chronic and unchanged). Negative for arthralgias.  Skin:  Negative for itching and rash.  Neurological:  Negative for dizziness, extremity weakness, headaches and numbness.   Hematological:  Negative for adenopathy. Does not bruise/bleed easily.  Psychiatric/Behavioral:  Negative for depression. The patient is not nervous/anxious.       PHYSICAL EXAMINATION   Onc Performance Status - 01/13/23 1519       ECOG Perf Status   ECOG Perf Status Restricted in physically strenuous activity but ambulatory and able to carry out work of a light or sedentary nature, e.g., light house work, office work      KPS SCALE   KPS % SCORE Able to carry on normal activity, minor s/s of disease             Vitals:   01/13/23 1509  BP: 135/83  Pulse: 71  Resp: 16  Temp: 97.7 F (36.5 C)  SpO2: 98%    Physical Exam Constitutional:      General: She is not in acute distress.    Appearance: Normal appearance. She is not toxic-appearing.  HENT:     Head: Normocephalic and atraumatic.     Mouth/Throat:     Mouth: Mucous membranes are moist.     Pharynx: Oropharynx is clear. No oropharyngeal exudate or posterior oropharyngeal erythema.  Eyes:     General: No scleral icterus. Cardiovascular:     Rate and Rhythm: Normal rate and regular rhythm.     Pulses: Normal pulses.     Heart sounds: Normal heart sounds.  Pulmonary:     Effort: Pulmonary effort is normal.     Breath sounds: Normal breath sounds.  Chest:     Comments: Right breast s/p mastectomy and radiation, no sign of local recurrence, left breast benign Abdominal:     General: Abdomen is flat. Bowel sounds are normal. There is no distension.     Palpations: Abdomen is soft.     Tenderness: There is no abdominal tenderness.  Musculoskeletal:        General: No swelling.     Cervical back: Neck supple.  Lymphadenopathy:     Cervical: No cervical adenopathy.  Skin:    General: Skin is warm and dry.     Findings: No rash.  Neurological:     General: No  focal deficit present.     Mental Status: She is alert.  Psychiatric:        Mood and Affect: Mood normal.        Behavior: Behavior normal.      LABORATORY DATA:  CBC    Component Value Date/Time   WBC 6.9 01/13/2023 1404   WBC 6.7 08/15/2020 1441   RBC 4.44 01/13/2023 1404   HGB 12.7 01/13/2023 1404   HCT 37.9 01/13/2023 1404   PLT 218 01/13/2023 1404   MCV 85.4 01/13/2023 1404   MCH 28.6 01/13/2023 1404   MCHC 33.5 01/13/2023 1404   RDW 14.2 01/13/2023 1404   LYMPHSABS 1.3 01/13/2023 1404   MONOABS 0.5 01/13/2023 1404   EOSABS 0.1 01/13/2023 1404   BASOSABS 0.0 01/13/2023 1404    CMP     Component Value Date/Time   NA 142 01/13/2023 1404   NA 143 01/08/2023 1155   K 4.1 01/13/2023 1404   CL 108 01/13/2023 1404   CO2 27 01/13/2023 1404   GLUCOSE 92 01/13/2023 1404   BUN 19 01/13/2023 1404   BUN 23 01/08/2023 1155   CREATININE 0.95 01/13/2023 1404   CREATININE 0.83 09/24/2016 1151   CALCIUM 9.7 01/13/2023 1404   PROT 7.0 01/13/2023 1404   PROT 6.6 01/08/2023 1155   ALBUMIN 4.2 01/13/2023 1404   ALBUMIN 4.3 01/08/2023 1155   AST 21 01/13/2023 1404   ALT 20 01/13/2023 1404   ALKPHOS 76 01/13/2023 1404   BILITOT 0.5 01/13/2023 1404   GFRNONAA >60 01/13/2023 1404   GFRNONAA 79 09/24/2016 1151   GFRAA >60 02/09/2020 1409   GFRAA >89 09/24/2016 1151     ASSESSMENT and THERAPY PLAN:   Malignant neoplasm of upper-outer quadrant of right breast in female, estrogen receptor negative (HCC) Jacqueline Good is a 62 year old woman with h/o stage IIA ER/PR negative, HER2 positive right breast cancer diagnosed in 2019 s/p neoadjuvant chemotherapy, right mastectomy, and adjuvant radiation therapy.    Jacqueline Good has no clinical or radiographic sign of breast cancer recurrence.  She will continue to undergo annual mammograms.  We discussed health maintenance.    Jacqueline Good will return in 1 year for f/u.  She knows to call for any questions or concerns that may arise between now and her next appointment.     All questions were answered. The patient knows to call the clinic with any problems, questions or concerns. We can  certainly see the patient much sooner if necessary.  Total encounter time:20 minutes*in face-to-face visit time, chart review, lab review, care coordination, order entry, and documentation of the encounter time.    Jacqueline Anes, NP 01/13/23 5:12 PM Medical Oncology and Hematology [facility] [address] [place], [place] [address] Tel. [phone]    Fax. [phone]  *Total Encounter Time as defined by the Centers for Medicare and Medicaid Services includes, in addition to the face-to-face time of a patient visit (documented in the note above) non-face-to-face time: obtaining and reviewing outside history, ordering and reviewing medications, tests or procedures, care coordination (communications with other health care professionals or caregivers) and documentation in the medical record.

## 2023-01-13 NOTE — Assessment & Plan Note (Signed)
Jacqueline Good is a 62 year old woman with h/o stage IIA ER/PR negative, HER2 positive right breast cancer diagnosed in 2019 s/p neoadjuvant chemotherapy, right mastectomy, and adjuvant radiation therapy.    Jacqueline Good has no clinical or radiographic sign of breast cancer recurrence.  She will continue to undergo annual mammograms.  We discussed health maintenance.    Jacqueline Good will return in 1 year for f/u.  She knows to call for any questions or concerns that may arise between now and her next appointment.

## 2023-01-14 ENCOUNTER — Ambulatory Visit: Payer: 59 | Admitting: Neurology

## 2023-01-15 ENCOUNTER — Telehealth: Payer: Self-pay | Admitting: Adult Health

## 2023-01-15 ENCOUNTER — Other Ambulatory Visit (HOSPITAL_COMMUNITY): Payer: Self-pay

## 2023-01-15 NOTE — Telephone Encounter (Signed)
Scheduled appointment per 6/4 los. Patient is aware of the made appointment.

## 2023-01-17 ENCOUNTER — Other Ambulatory Visit (HOSPITAL_COMMUNITY): Payer: Self-pay

## 2023-01-19 ENCOUNTER — Other Ambulatory Visit: Payer: Self-pay

## 2023-01-19 ENCOUNTER — Other Ambulatory Visit (HOSPITAL_COMMUNITY): Payer: Self-pay

## 2023-01-20 DIAGNOSIS — M542 Cervicalgia: Secondary | ICD-10-CM | POA: Diagnosis not present

## 2023-01-20 DIAGNOSIS — M5417 Radiculopathy, lumbosacral region: Secondary | ICD-10-CM | POA: Diagnosis not present

## 2023-01-23 ENCOUNTER — Other Ambulatory Visit (HOSPITAL_COMMUNITY): Payer: Self-pay

## 2023-01-23 DIAGNOSIS — G8929 Other chronic pain: Secondary | ICD-10-CM | POA: Diagnosis not present

## 2023-01-23 DIAGNOSIS — R5383 Other fatigue: Secondary | ICD-10-CM | POA: Diagnosis not present

## 2023-01-23 DIAGNOSIS — M545 Low back pain, unspecified: Secondary | ICD-10-CM | POA: Diagnosis not present

## 2023-01-23 DIAGNOSIS — Z79899 Other long term (current) drug therapy: Secondary | ICD-10-CM | POA: Diagnosis not present

## 2023-01-23 DIAGNOSIS — E7800 Pure hypercholesterolemia, unspecified: Secondary | ICD-10-CM | POA: Diagnosis not present

## 2023-01-23 DIAGNOSIS — E559 Vitamin D deficiency, unspecified: Secondary | ICD-10-CM | POA: Diagnosis not present

## 2023-01-23 MED ORDER — OXYCODONE-ACETAMINOPHEN 10-325 MG PO TABS
ORAL_TABLET | ORAL | 0 refills | Status: AC
  Filled 2023-01-23: qty 120, 30d supply, fill #0

## 2023-02-05 DIAGNOSIS — M5416 Radiculopathy, lumbar region: Secondary | ICD-10-CM | POA: Diagnosis not present

## 2023-02-06 ENCOUNTER — Other Ambulatory Visit: Payer: Self-pay

## 2023-02-06 ENCOUNTER — Other Ambulatory Visit: Payer: Self-pay | Admitting: Psychiatry

## 2023-02-06 ENCOUNTER — Other Ambulatory Visit (HOSPITAL_COMMUNITY): Payer: Self-pay

## 2023-02-06 DIAGNOSIS — G4700 Insomnia, unspecified: Secondary | ICD-10-CM

## 2023-02-09 ENCOUNTER — Other Ambulatory Visit (HOSPITAL_COMMUNITY): Payer: Self-pay

## 2023-02-09 ENCOUNTER — Encounter: Payer: Self-pay | Admitting: Bariatrics

## 2023-02-09 ENCOUNTER — Ambulatory Visit (INDEPENDENT_AMBULATORY_CARE_PROVIDER_SITE_OTHER): Payer: 59 | Admitting: Bariatrics

## 2023-02-09 DIAGNOSIS — Z6834 Body mass index (BMI) 34.0-34.9, adult: Secondary | ICD-10-CM

## 2023-02-09 DIAGNOSIS — E88819 Insulin resistance, unspecified: Secondary | ICD-10-CM

## 2023-02-09 DIAGNOSIS — I10 Essential (primary) hypertension: Secondary | ICD-10-CM

## 2023-02-09 DIAGNOSIS — E559 Vitamin D deficiency, unspecified: Secondary | ICD-10-CM

## 2023-02-09 DIAGNOSIS — Z6831 Body mass index (BMI) 31.0-31.9, adult: Secondary | ICD-10-CM

## 2023-02-09 DIAGNOSIS — F3289 Other specified depressive episodes: Secondary | ICD-10-CM

## 2023-02-09 DIAGNOSIS — E669 Obesity, unspecified: Secondary | ICD-10-CM

## 2023-02-09 DIAGNOSIS — R632 Polyphagia: Secondary | ICD-10-CM

## 2023-02-09 MED ORDER — TRULICITY 3 MG/0.5ML ~~LOC~~ SOAJ
3.0000 mg | SUBCUTANEOUS | 0 refills | Status: DC
Start: 2023-02-09 — End: 2023-03-19
  Filled 2023-02-09: qty 2, 28d supply, fill #0

## 2023-02-09 MED ORDER — VITAMIN D (ERGOCALCIFEROL) 1.25 MG (50000 UNIT) PO CAPS
50000.0000 [IU] | ORAL_CAPSULE | ORAL | 0 refills | Status: DC
  Filled 2023-02-09: qty 5, 35d supply, fill #0

## 2023-02-09 MED ORDER — METFORMIN HCL 500 MG PO TABS
500.0000 mg | ORAL_TABLET | Freq: Two times a day (BID) | ORAL | 0 refills | Status: DC
  Filled 2023-02-09: qty 60, 30d supply, fill #0

## 2023-02-09 MED ORDER — CHLORTHALIDONE 25 MG PO TABS
25.0000 mg | ORAL_TABLET | Freq: Every day | ORAL | 0 refills | Status: DC
Start: 2023-02-09 — End: 2023-03-19
  Filled 2023-02-09: qty 30, 30d supply, fill #0

## 2023-02-09 NOTE — Progress Notes (Signed)
WEIGHT SUMMARY AND BIOMETRICS  Weight Lost Since Last Visit: 6lb  Vitals Temp: 98.1 F (36.7 C) BP: 114/76 Pulse Rate: 69 SpO2: 97 %   Anthropometric Measurements Height: 5\' 4"  (1.626 m) Weight: 181 lb (82.1 kg) BMI (Calculated): 31.05 Weight at Last Visit: 187lb Weight Lost Since Last Visit: 6lb Starting Weight: 238lb Total Weight Loss (lbs): 57 lb (25.9 kg)   Body Composition  Body Fat %: 31.4 % Fat Mass (lbs): 57 lbs Muscle Mass (lbs): 118.2 lbs Total Body Water (lbs): 72.4 lbs Visceral Fat Rating : 9   Other Clinical Data Fasting: no Labs: no Today's Visit #: 25 Starting Date: 03/21/21    OBESITY Jacqueline Good is here to discuss her progress with her obesity treatment plan along with follow-up of her obesity related diagnoses.     Nutrition Plan: the Category 1 plan - 70% adherence.  Current exercise:  Chair exercises.  Interim History:  She is down another 6 lbs and doing well overall.  Is not skipping meals, Water intake is adequate., Denies polyphagia, and Denies excessive cravings.  Pharmacotherapy: Gissel is on Trulicity 3.0 mg SQ weekly Adverse side effects: None Hunger is moderately controlled.  Cravings are moderately controlled.  Assessment/Plan:    1. Vitamin D deficiency Vitamin D Deficiency Vitamin D is at goal of 50.  Most recent vitamin D level was 58.3. She is on  prescription ergocalciferol 50,000 IU weekly. Lab Results  Component Value Date   VD25OH 58.3 01/08/2023   VD25OH 33.2 11/27/2021   VD25OH 14.4 (L) 03/21/2021    Plan: Refill prescription vitamin D 50,000 IU weekly.   2. Insulin resistance Insulin Resistance Jacqueline Good has had elevated fasting insulin readings. Goal is HgbA1c < 5.7, fasting insulin at l0 or less, and preferably at 5.  She reports denies polyphagia. Medication(s): Trulicity Lab Results  Component Value Date   HGBA1C 5.3 01/08/2023   Lab Results  Component Value Date   INSULIN 9.2  01/08/2023   INSULIN 11.7 11/27/2021   INSULIN 31.4 (H) 03/21/2021    Plan Medication(s): Trulicity 3.0 mg SQ weekly Will work on the agreed upon plan. Will minimize refined carbohydrates ( sweets and starches), and focus more on complex carbohydrates.  Increase the micronutrients found in leafy greens, which include magnesium, polyphenols, and vitamin C which have been postulated to help with insulin sensitivity. Minimize "fast food" and cook more meals at home.  Increase fiber to 25 to 30 grams daily.  Information sheet on " Insulin Resistance and Prediabetes".    3. Polyphagia Polyphagia Jacqueline Good endorses excessive hunger.  Medication(s): Trulicity Effects of medication:  moderately controlled. Cravings are moderately controlled.   Plan: Medication(s): Trulicity 3.0 mg SQ weekly Will increase water, protein and fiber to help assuage hunger.  Will minimize foods that have a high glucose index/load to minimize reactive hypoglycemia.   4. Essential hypertension Hypertension Hypertension well controlled.  Medication(s): Chlorthalidone 25 mg  BP Readings from Last 3 Encounters:  02/09/23 114/76  01/13/23 135/83  01/08/23 117/74   Lab Results  Component Value Date   CREATININE 0.95 01/13/2023   CREATININE 0.92 01/08/2023   CREATININE 1.02 (H) 12/26/2021   No results found for: "GFR"  Plan: Continue all antihypertensives at current dosages. No added salt. Will keep sodium content to 1,500 mg or less per day.    5. Other depression, emotional eating Eating disorder/emotional eating Jacqueline Good has had issues with stress eating and emotional eating. Currently this is moderately controlled. Overall mood is stable.  Denies suicidal/homicidal ideation. Medication(s): none  Plan:  Discussed distractions to curb eating behaviors. Discussed activities to do with one's hands in the evening  Be sure to get adequate rest as lack of rest can trigger appetite.  Have plan in place  for stressful events.  Consider other rewards besides food.     Generalized Obesity: Current BMI BMI (Calculated): 31.05   Pharmacotherapy Plan Continue and refill  Trulicity 3.0 mg SQ weekly  Jacqueline Good is currently in the action stage of change. As such, her goal is to continue with weight loss efforts.  She has agreed to the Category 1 plan.  Exercise goals: For substantial health benefits, adults should do at least 150 minutes (2 hours and 30 minutes) a week of moderate-intensity, or 75 minutes (1 hour and 15 minutes) a week of vigorous-intensity aerobic physical activity, or an equivalent combination of moderate- and vigorous-intensity aerobic activity. Aerobic activity should be performed in episodes of at least 10 minutes, and preferably, it should be spread throughout the week.  Behavioral modification strategies: increasing lean protein intake, increase water intake, better snacking choices, planning for success, and increasing vegetables.  Jacqueline Good has agreed to follow-up with our clinic in 4 weeks.        Objective:   VITALS: Per patient if applicable, see vitals. GENERAL: Alert and in no acute distress. CARDIOPULMONARY: No increased WOB. Speaking in clear sentences.  PSYCH: Pleasant and cooperative. Speech normal rate and rhythm. Affect is appropriate. Insight and judgement are appropriate. Attention is focused, linear, and appropriate.  NEURO: Oriented as arrived to appointment on time with no prompting.   Attestation Statements:   This was prepared with the assistance of Engineer, civil (consulting).  Occasional wrong-word or sound-a-like substitutions may have occurred due to the inherent limitations of voice recognition software.   [name], DO

## 2023-02-10 ENCOUNTER — Telehealth: Payer: Self-pay

## 2023-02-10 ENCOUNTER — Other Ambulatory Visit (HOSPITAL_COMMUNITY): Payer: Self-pay

## 2023-02-10 ENCOUNTER — Other Ambulatory Visit: Payer: Self-pay | Admitting: Psychiatry

## 2023-02-10 DIAGNOSIS — G4700 Insomnia, unspecified: Secondary | ICD-10-CM

## 2023-02-10 MED ORDER — ZOLPIDEM TARTRATE 10 MG PO TABS
10.0000 mg | ORAL_TABLET | Freq: Every evening | ORAL | 0 refills | Status: DC | PRN
Start: 2023-02-10 — End: 2023-03-16
  Filled 2023-02-10: qty 30, 30d supply, fill #0

## 2023-02-10 NOTE — Telephone Encounter (Signed)
Ordered

## 2023-02-10 NOTE — Telephone Encounter (Signed)
Medication refill - Patient left a message she is in need of a new Ambien 10 mg order, last prescribed 11/13/22 for #30 with no refills. Patient last seen 01/06/23 and returns next on 03/04/23. Patient stated she has been contacting her pharmacy to send request order but has not heard anything back and needs refill as soon as possible.

## 2023-02-10 NOTE — Telephone Encounter (Signed)
Medication management - Telephone call with patient to inform Dr. Vanetta Shawl had sent in pt's needed new Ambien order to her [facility]

## 2023-02-18 ENCOUNTER — Other Ambulatory Visit (HOSPITAL_COMMUNITY): Payer: Self-pay

## 2023-02-23 NOTE — Progress Notes (Deleted)
BH MD/PA/NP OP Progress Note  02/23/2023 12:40 PM Jacqueline Good  MRN:  [id number]  Chief Complaint: No chief complaint on file.  HPI: *** Visit Diagnosis: No diagnosis found.  Past Psychiatric History: Please see initial evaluation for full details. I have reviewed the history. No updates at this time.     Past Medical History:  Past Medical History:  Diagnosis Date   Anemia    Anxiety    Arthritis    "all over" (08/12/2018)   Asthma    Back pain    Benign paroxysmal positional vertigo 10/31/2014   Breast cancer, right breast (HCC) dx'd 02/2018   Chronic bronchitis (HCC)    Depression    Family history of breast cancer    Family history of prostate cancer    Family history of uterine cancer    GERD (gastroesophageal reflux disease)    History of blood transfusion    "several; related to low blood" (08/12/2018)   History of radiation therapy 11/15/18- 12/17/18   Right Chest wall, SCV, PAB. 25 fractions.    Lymphedema of right arm    Palpitations    Personal history of chemotherapy    Personal history of radiation therapy     Past Surgical History:  Procedure Laterality Date   AXILLARY LYMPH NODE DISSECTION Right 08/12/2018   Procedure: AXILLARY LYMPH NODE DISSECTION;  Surgeon: Ovidio Kin, MD;  Location: [facility] OR;  Service: General;  Laterality: Right;   BREAST BIOPSY Right 03/2018   BREAST RECONSTRUCTION WITH PLACEMENT OF TISSUE EXPANDER AND FLEX HD (ACELLULAR HYDRATED DERMIS) Right 08/12/2018   Procedure: RIGHT BREAST RECONSTRUCTION WITH PLACEMENT OF TISSUE EXPANDER AND FLEX HD (ACELLULAR HYDRATED DERMIS);  Surgeon: Peggye Form, DO;  Location: [facility] OR;  Service: Plastics;  Laterality: Right;   BREAST REDUCTION SURGERY Left 09/28/2019   Procedure: LEFT MAMMARY REDUCTION/MASTOPEXY  (BREAST);  Surgeon: Peggye Form, DO;  Location: [facility];  Service: Plastics;  Laterality: Left;   DILATION AND CURETTAGE OF UTERUS     ENDOMETRIAL ABLATION     IR IMAGING  GUIDED PORT INSERTION  03/18/2018   MASTECTOMY MODIFIED RADICAL Right 08/12/2018   Procedure: RIGHT MODIFIED RADICAL MASTECTOMY;  Surgeon: Ovidio Kin, MD;  Location[facility] OR;  Service: General;  Laterality: Right;   MASTECTOMY MODIFIED RADICAL Right 08/12/2018   w/axillary LND   MYOMECTOMY     REDUCTION MAMMAPLASTY Left 2021   REMOVAL OF TISSUE EXPANDER AND PLACEMENT OF IMPLANT Right 10/26/2018   Procedure: Removal of right breast expander;  Surgeon: Peggye Form, DO;  Location: [facility] OR;  Service: Plastics;  Laterality: Right;  75 min, please    Family Psychiatric History: Please see initial evaluation for full details. I have reviewed the history. No updates at this time.     Family History:  Family History  Problem Relation Age of Onset   Lupus Sister    Heart disease Sister    Breast cancer Mother 76   Prostate cancer Paternal Uncle    Diabetes Maternal Grandmother    Heart Problems Maternal Grandmother    Prostate cancer Maternal Grandfather    Prostate cancer Paternal Grandfather    Prostate cancer Other        MGFs brother   Breast cancer Other        Mat great-grandmother's sister   Uterine cancer Maternal Great-grandmother    Prostate cancer Other        PGFs brother    Social History:  Social  History   Socioeconomic History   Marital status: Single    Spouse name: Not on file   Number of children: Not on file   Years of education: Not on file   Highest education level: Not on file  Occupational History   Not on file  Tobacco Use   Smoking status: Former    Current packs/day: 0.00    Average packs/day: 1 pack/day for 38.0 years (38.0 ttl pk-yrs)    Types: Cigarettes    Start date: 05/15/1980    Quit date: 05/15/2018    Years since quitting: 4.7   Smokeless tobacco: Never  Vaping Use   Vaping status: Never Used  Substance and Sexual Activity   Alcohol use: Not Currently    Comment: occasional drink   Drug use: No   Sexual activity: Not Currently     Birth control/protection: None  Other Topics Concern   Not on file  Social History Narrative   Right Handed   Lives in a one story home   Drinks a cup of coffee every so often    Social Determinants of Health   Financial Resource Strain: Not on file  Food Insecurity: Not on file  Transportation Needs: No Transportation Needs (09/07/2018)   PRAPARE - Administrator, Civil Service (Medical): No    Lack of Transportation (Non-Medical): No  Physical Activity: Not on file  Stress: Not on file  Social Connections: Unknown (12/24/2021)   [facility]   Social Network    Social Network: Not on file    Allergies:  Allergies  Allergen Reactions   Gadolinium Derivatives Itching and Cough    Pt began sneezing and coughing as soon as MRI contrast was injected. Severe nasal congestion. No rash or hives no SOB.    Hydrocodone Hives and Itching    Pt says she can take it with benadryl.    Iodinated Contrast Media Itching    Metabolic Disorder Labs: Lab Results  Component Value Date   HGBA1C 5.3 01/08/2023   No results found for: "PROLACTIN" Lab Results  Component Value Date   CHOL 170 01/08/2023   TRIG 109 01/08/2023   HDL 48 01/08/2023   CHOLHDL 3.2 02/18/2019   VLDL 23 02/18/2019   LDLCALC 102 (H) 01/08/2023   LDLCALC 127 (H) 11/27/2021   Lab Results  Component Value Date   TSH 0.641 01/08/2023   TSH 2.200 03/21/2021    Therapeutic Level Labs: No results found for: "LITHIUM" No results found for: "VALPROATE" No results found for: "CBMZ"  Current Medications: Current Outpatient Medications  Medication Sig Dispense Refill   Albuterol Sulfate (PROAIR RESPICLICK) 108 (90 Base) MCG/ACT AEPB Inhale 2 puffs into the lungs every 4 (four) hours as needed. 1 each 1   ALPRAZolam (XANAX) 1 MG tablet May take 1 tablet (1 mg total) by mouth 2 (two) times daily as needed for anxiety. May also take 0.5 tablets (0.5 mg total) daily as needed for anxiety. 75  tablet 1   buPROPion (WELLBUTRIN SR) 150 MG 12 hr tablet Take 1 tablet by mouth daily. 30 tablet 0   chlorthalidone (HYGROTON) 25 MG tablet Take 1 tablet (25 mg) by mouth daily. 30 tablet 0   Dulaglutide (TRULICITY) 3 MG/0.5ML SOPN Inject 3 mg (0.5 ml) as directed once a week. 2 mL 0   esomeprazole (NEXIUM) 40 MG capsule Take 1 capsule (40 mg total) by mouth daily. 30 capsule 2   fluticasone-salmeterol (ADVAIR) 100-50 MCG/ACT AEPB  Inhale 1 puff into the lungs 2 (two) times daily. 60 each 4   HYDROcodone-acetaminophen (NORCO/VICODIN) 5-325 MG tablet Take 1 tablet by mouth twice a day as needed. 10 tablet 0   LATUDA 40 MG TABS tablet Take 1 tablet (40 mg total) by mouth daily with breakfast. 90 tablet 0   loratadine (CLARITIN) 10 MG tablet Take 1 tablet (10 mg total) by mouth daily as needed for allergies. 30 tablet 11   meloxicam (MOBIC) 15 MG tablet Take 1 tablet (15 mg total) by mouth daily. 30 tablet 0   metFORMIN (GLUCOPHAGE) 500 MG tablet Take 1 tablet (500 mg) by mouth 2 times daily with meals. 60 tablet 0   mirtazapine (REMERON) 30 MG tablet Take 1 tablet (30 mg total) by mouth at bedtime. 90 tablet 0   Multiple Vitamin (MULTIVITAMIN) capsule Take 1 capsule by mouth daily.     naloxone (NARCAN) nasal spray 4 mg/0.1 mL Spray 1 dose into ONE nostril every 3 minutes alternate nostrils with each dose until help arrives 2 each 1   omeprazole (PRILOSEC) 40 MG capsule TAKE 1 CAPSULE BY MOUTH DAILY. 30 capsule 2   oxyCODONE-acetaminophen (PERCOCET) 10-325 MG tablet Take 1 tablet by mouth 4 (four) times daily as needed. 120 tablet 0   oxyCODONE-acetaminophen (PERCOCET) 10-325 MG tablet Take 1 tablet by mouth 4 times daily as needed 120 tablet 0   potassium chloride (KLOR-CON M) 10 MEQ tablet Take 1 tablet by mouth with food once daily 10 tablet 0   propranolol (INDERAL) 40 MG tablet Take 40 mg by mouth daily as needed (heart palpitation.).     topiramate (TOPAMAX) 50 MG tablet Take 1.5 tablets (75 mg  total) by mouth at bedtime. (Patient not taking: Reported on 02/09/2023) 45 tablet 5   venlafaxine XR (EFFEXOR-XR) 75 MG 24 hr capsule Take 3 capsules (225 mg total) by mouth daily with breakfast. 270 capsule 1   Vitamin D, Ergocalciferol, (DRISDOL) 1.25 MG (50000 UNIT) CAPS capsule Take 1 capsule by mouth every 7 days. 5 capsule 0   zolpidem (AMBIEN) 10 MG tablet Take 1 tablet (10 mg total) by mouth at bedtime as needed. 30 tablet 0   No current facility-administered medications for this visit.     Musculoskeletal: Strength & Muscle Tone:  N/A Gait & Station:  N/A Patient leans: N/A  Psychiatric Specialty Exam: Review of Systems  There were no vitals taken for this visit.There is no height or weight on file to calculate BMI.  General Appearance: {Appearance:22683}  Eye Contact:  {[facility] EYE CONTACT:22684}  Speech:  Clear and Coherent  Volume:  Normal  Mood:  {BHH MOOD:22306}  Affect:  {Affect (PAA):22687}  Thought Process:  Coherent  Orientation:  Full (Time, Place, and Person)  Thought Content: Logical   Suicidal Thoughts:  {ST/HT (PAA):22692}  Homicidal Thoughts:  {ST/HT (PAA):22692}  Memory:  Immediate;   Good  Judgement:  {Judgement (PAA):22694}  Insight:  {Insight (PAA):22695}  Psychomotor Activity:  Normal  Concentration:  Concentration: Good and Attention Span: Good  Recall:  Good  Fund of Knowledge: Good  Language: Good  Akathisia:  No  Handed:  Right  AIMS (if indicated): not done  Assets:  Communication Skills Desire for Improvement  ADL's:  Intact  Cognition: WNL  Sleep:  {B[facility] GOOD/FAIR/POOR:22877}   Screenings: GAD-7    Garment/textile technologist Visit from 04/22/2018 in [facility] Office Visit from 06/03/2017 in [facility] Office  Visit from 09/24/2016 in [facility] Office Visit from 03/27/2016 in [facility]  Total GAD-7 Score  11 3 15 16       PHQ2-9    Flowsheet Row Video Visit from 09/23/2021 in [facility] Video Visit from 04/09/2021 in [facility] Office Visit from 03/21/2021 in [facility] Video Visit from 11/07/2020 in [facility] Office Visit from 04/22/2018 in [facility]  PHQ-2 Total Score 4 6 1 4 5   PHQ-9 Total Score 9 11 4 11 15       Flowsheet Row ED from 10/16/2022 in [facility] Video Visit from 01/08/2021 in [facility]  C-SSRS RISK CATEGORY No Risk No Risk        Assessment and Plan:  Jacqueline Good is a 62 y.o. year old female with a history of depression, OCD, estrogen negative T2 pN1, stage IIB invasive ductal carcinoma, diagnosed 02/25/2018 s/p mastectomy 08/2018, s/p neoadjuvant chemo immunotherapy/on trastzumab, asthma, OA, chronic back pain, who presents for follow up appointment for below.    1. MDD (major depressive disorder), recurrent episode, moderate (HCC) 2. Generalized anxiety disorder Acute stressors include: brother with substance use  Other stressors include:  father in [place], who struggles with loneliness, unemployment, chronic pain   History: Tx from [facility], she declines therapy, in person visit. Originally on venlafaxine 225 mg daily, Buspar 15 mg three times a day, xanax 3.5 mg per day, Ambien 10 mg at night,    Exam is notable for irritability, and she declined to elaborate regarding her psychosocial stressors.  Discussed with the patient the limitations of safely prescribing medications without fully understanding her situation. Also discussed the option of transferring to another practice due to this challenge and difficulty in establishing therapeutic alliance. She expressed understanding and agreed to consider this  until the next visit, as she is unsure of what she wants to do.  Will continue venlafaxine, mirtazapine, Latuda to target depression.     3. Benzodiazepine dependence (HCC) - reduced from 1 mg TID 11/2022  She reports worsening in anxiety in the setting of tapering down Xanax, although she also reports other stressors which she does not disclose.  She denies any fall since the last visit after she had mechanical fall due to uneven floor prior  to this visit.  Will plan to taper down at the next visit to minimize the risk of fall, tolerance, respiratory suppression with concomitant use of opioid.    4. Insomnia, unspecified type - reduced 11/2022    - she declines referral for sleep evaluation Worsening.  Will continue current dose of Ambien as needed to target insomnia. Discussed potential risk of fall.  Will consider tapering off this medication after she is able to taper off Xanax.    # inattention Unchanged. She has a history of worsening in concentration over the past few years before chemotherapy.  No history of IEP/ADHD.  The etiology is likely multifactorial given her ongoing mood symptoms.  She is not interested in a neuropsychological evaluation.      Plan Continue venlafaxine 225 mg daily - 30 tabs left Continue Latuda 40 mg daily  Continue mirtazapine 30 mg at night -15 tabs left. She declined to continue this medication Continue Xanax 1 mg bidprn and 0.5  mg daily prn for anxiety  Continue Ambien 10 mg at night as needed for insomnia 4/4- one refill left Next appointment: 7/24 at 10 am, video. She declined in person visit as she does not drive highway.  - on oxycodone 10-325 qid    Past trials of medication: sertraline, fluoxetine, lexapro, Buspar, venlafaxine, duloxetine, bupropion, quetiapine, Abilify, gabapentin   The patient demonstrates the following risk factors for suicide: Chronic risk factors for suicide include: psychiatric disorder of depression and medical illness of  breast cancer. Acute risk factors for suicide include: unemployment and loss (financial, interpersonal, professional). Protective factors for this patient include: positive social support, responsibility to others (children, family), coping skills and hope for the future. Considering these factors, the overall suicide risk at this point appears to be low. Patient is appropriate for outpatient follow up.      Collaboration of Care: Collaboration of Care: {BH OP Collaboration of Care:21014065}  Patient/Guardian was advised Release of Information must be obtained prior to any record release in order to collaborate their care with an outside provider. Patient/Guardian was advised if they have not already done so to contact the registration department to sign all necessary forms in order for [place] to release information regarding their care.   Consent: Patient/Guardian gives verbal consent for treatment and assignment of benefits for services provided during this visit. Patient/Guardian expressed understanding and agreed to proceed.    Neysa Hotter, MD 02/23/2023, 12:40 PM

## 2023-02-25 ENCOUNTER — Other Ambulatory Visit (HOSPITAL_COMMUNITY): Payer: Self-pay

## 2023-02-25 DIAGNOSIS — Z79899 Other long term (current) drug therapy: Secondary | ICD-10-CM | POA: Diagnosis not present

## 2023-02-25 DIAGNOSIS — G8929 Other chronic pain: Secondary | ICD-10-CM | POA: Diagnosis not present

## 2023-02-25 DIAGNOSIS — M545 Low back pain, unspecified: Secondary | ICD-10-CM | POA: Diagnosis not present

## 2023-02-25 MED ORDER — OXYCODONE-ACETAMINOPHEN 10-325 MG PO TABS
1.0000 | ORAL_TABLET | Freq: Four times a day (QID) | ORAL | 0 refills | Status: AC | PRN
  Filled 2023-02-25: qty 120, 30d supply, fill #0

## 2023-03-04 ENCOUNTER — Telehealth: Payer: 59 | Admitting: Psychiatry

## 2023-03-11 ENCOUNTER — Ambulatory Visit: Payer: 59 | Admitting: Bariatrics

## 2023-03-11 ENCOUNTER — Other Ambulatory Visit (HOSPITAL_COMMUNITY): Payer: Self-pay

## 2023-03-11 ENCOUNTER — Ambulatory Visit (HOSPITAL_COMMUNITY)
Admission: EM | Admit: 2023-03-11 | Discharge: 2023-03-11 | Disposition: A | Discharge status: Home / Self Care | Payer: 59 | Attending: Family | Admitting: Family

## 2023-03-11 DIAGNOSIS — F411 Generalized anxiety disorder: Secondary | ICD-10-CM | POA: Diagnosis not present

## 2023-03-11 NOTE — Progress Notes (Signed)
   03/11/23 0853  [facility] Triage Screening (Walk-ins at [facility] only)  How Did You Hear About [place]? Self  What Is the Reason for Your Visit/Call Today? Pt presents to [facility] vol. Pt reports "I am in need of medication". Pt appears to be irritable and unable to elaborate her presenting concern at this time. Pt states, " I am in need of medication today and I do not appreciate being scanned down and my belongings taken from me, I am not a criminal." Per the pt request, she would like her medication "ASAP". Pt denies and substance use in the last 24 hrs. Pt also denies SI, HI, and AVH.  How Long Has This Been Causing You Problems? <Week  Have You Recently Had Any Thoughts About Hurting Yourself? No  Are You Planning to Commit Suicide/Harm Yourself At This time? No  Have you Recently Had Thoughts About Hurting Someone [name]? No  Are You Planning To Harm Someone At This Time? No  Are you currently experiencing any auditory, visual or other hallucinations? No  Have You Used Any Alcohol or Drugs in the Past 24 Hours? No  Do you have any current medical co-morbidities that require immediate attention? No  Clinician description of patient physical appearance/behavior: well grommed, irritable, angry  What Do You Feel Would Help You the Most Today? Medication(s)  If access to [facility] was not available, would you have sought care in the Emergency Department? No  Determination of Need Routine (7 days)  Options For Referral Medication Management

## 2023-03-11 NOTE — Progress Notes (Signed)
Virtual Visit via Video Note  I connected with Jacqueline Good on 03/16/23 at  1:00 PM EDT by a video enabled telemedicine application and verified that I am speaking with the correct person using two identifiers.  Location: Patient: home Provider: office Persons participated in the visit- patient, provider    I discussed the limitations of evaluation and management by telemedicine and the availability of in person appointments. The patient expressed understanding and agreed to proceed.   I discussed the assessment and treatment plan with the patient. The patient was provided an opportunity to ask questions and all were answered. The patient agreed with the plan and demonstrated an understanding of the instructions.   The patient was advised to call back or seek an in-person evaluation if the symptoms worsen or if the condition fails to improve as anticipated.  I provided 20 minutes of non-face-to-face time during this encounter.   Neysa Hotter, MD    [facility] MD/PA/NP OP Progress Note  03/16/2023 1:46 PM Jacqueline Good  MRN:  [id number]  Chief Complaint:  Chief Complaint  Patient presents with   Follow-up   HPI:  According to the chart review, the following events have occurred since the last visit: The patient was seen at [facility], asking for medication.   This is a follow-up appointment for depression, anxiety, insomnia.  She began with a big sigh, stating that she has emotional volcano.  She is concerned about her father in [place], who has Alzheimer.  He has been dealing with this issues for a while. She goes out in the middle of the night. His wife lives with him.  She is also struggling with pain.  She has been feeling frustrated, emotional, crying a lot and angry.  She agrees that she went to [facility] as she wanted to see a new provider.  Although she has not contacted yet, she was reportedly given referral resources.  She states that she cannot manage her emotion.  When inquiring about  interest in therapy, she states that she has already talked about it, and states that she is not interested.  When she was talked about her pain clinic, she asks the reason it was asked. She states that it does not need to be discussed as this provider does not prescribe pain medication. After having discussion, she agrees that the record will be sent to [place].  She denies SI, hallucinations.  She denies alcohol use or drug use.  She denies any fall.  She discontinued mirtazapine a few months ago. She has not noticed any change since then.  She agrees with the plan as below.   Visit Diagnosis:    ICD-10-CM   1. MDD (major depressive disorder), recurrent episode, moderate (HCC)  F33.1     2. Generalized anxiety disorder  F41.1     3. Benzodiazepine dependence (HCC)  F13.20     4. Insomnia, unspecified type  G47.00 zolpidem (AMBIEN) 10 MG tablet      Past Psychiatric History: Please see initial evaluation for full details. I have reviewed the history. No updates at this time.     Past Medical History:  Past Medical History:  Diagnosis Date   Anemia    Anxiety    Arthritis    "all over" (08/12/2018)   Asthma    Back pain    Benign paroxysmal positional vertigo 10/31/2014   Breast cancer, right breast (HCC) dx'd 02/2018   Chronic bronchitis (HCC)    Depression    Family  history of breast cancer    Family history of prostate cancer    Family history of uterine cancer    GERD (gastroesophageal reflux disease)    History of blood transfusion    "several; related to low blood" (08/12/2018)   History of radiation therapy 11/15/18- 12/17/18   Right Chest wall, SCV, PAB. 25 fractions.    Lymphedema of right arm    Palpitations    Personal history of chemotherapy    Personal history of radiation therapy     Past Surgical History:  Procedure Laterality Date   AXILLARY LYMPH NODE DISSECTION Right 08/12/2018   Procedure: AXILLARY LYMPH NODE DISSECTION;  Surgeon: Ovidio Kin, MD;  Location[facility] OR;   Service: General;  Laterality: Right;   BREAST BIOPSY Right 03/2018   BREAST RECONSTRUCTION WITH PLACEMENT OF TISSUE EXPANDER AND FLEX HD (ACELLULAR HYDRATED DERMIS) Right 08/12/2018   Procedure: RIGHT BREAST RECONSTRUCTION WITH PLACEMENT OF TISSUE EXPANDER AND FLEX HD (ACELLULAR HYDRATED DERMIS);  Surgeon: Peggye Form, DO;  Location: [facility] OR;  Service: Plastics;  Laterality: Right;   BREAST REDUCTION SURGERY Left 09/28/2019   Procedure: LEFT MAMMARY REDUCTION/MASTOPEXY  (BREAST);  Surgeon: Peggye Form, DO;  Location: [facility];  Service: Plastics;  Laterality: Left;   DILATION AND CURETTAGE OF UTERUS     ENDOMETRIAL ABLATION     IR IMAGING GUIDED PORT INSERTION  03/18/2018   MASTECTOMY MODIFIED RADICAL Right 08/12/2018   Procedure: RIGHT MODIFIED RADICAL MASTECTOMY;  Surgeon: Ovidio Kin, MD;  Location: [facility] OR;  Service: General;  Laterality: Right;   MASTECTOMY MODIFIED RADICAL Right 08/12/2018   w/axillary LND   MYOMECTOMY     REDUCTION MAMMAPLASTY Left 2021   REMOVAL OF TISSUE EXPANDER AND PLACEMENT OF IMPLANT Right 10/26/2018   Procedure: Removal of right breast expander;  Surgeon: Peggye Form, DO;  Location: [facility] OR;  Service: Plastics;  Laterality: Right;  75 min, please    Family Psychiatric History: Please see initial evaluation for full details. I have reviewed the history. No updates at this time.     Family History:  Family History  Problem Relation Age of Onset   Lupus Sister    Heart disease Sister    Breast cancer Mother 60   Prostate cancer Paternal Uncle    Diabetes Maternal Grandmother    Heart Problems Maternal Grandmother    Prostate cancer Maternal Grandfather    Prostate cancer Paternal Grandfather    Prostate cancer Other        MGFs brother   Breast cancer Other        Mat great-grandmother's sister   Uterine cancer Maternal Great-grandmother    Prostate cancer Other        PGFs brother    Social History:  Social  History   Socioeconomic History   Marital status: Single    Spouse name: Not on file   Number of children: Not on file   Years of education: Not on file   Highest education level: Not on file  Occupational History   Not on file  Tobacco Use   Smoking status: Former    Current packs/day: 0.00    Average packs/day: 1 pack/day for 38.0 years (38.0 ttl pk-yrs)    Types: Cigarettes    Start date: 05/15/1980    Quit date: 05/15/2018    Years since quitting: 4.8   Smokeless tobacco: Never  Vaping Use   Vaping status: Never Used  Substance and Sexual Activity  Alcohol use: Not Currently    Comment: occasional drink   Drug use: No   Sexual activity: Not Currently    Birth control/protection: None  Other Topics Concern   Not on file  Social History Narrative   Right Handed   Lives in a one story home   Drinks a cup of coffee every so often    Social Determinants of Health   Financial Resource Strain: Not on file  Food Insecurity: Not on file  Transportation Needs: No Transportation Needs (09/07/2018)   PRAPARE - Transportation    Lack of Transportation (Medical): No    Lack of Transportation (Non-Medical): No  Physical Activity: Not on file  Stress: Not on file  Social Connections: Unknown (12/24/2021)   Received from [facility]   Social Network    Social Network: Not on file    Allergies:  Allergies  Allergen Reactions   Gadolinium Derivatives Itching and Cough    Pt began sneezing and coughing as soon as MRI contrast was injected. Severe nasal congestion. No rash or hives no SOB.    Hydrocodone Hives and Itching    Pt says she can take it with benadryl.    Iodinated Contrast Media Itching    Metabolic Disorder Labs: Lab Results  Component Value Date   HGBA1C 5.3 01/08/2023   No results found for: "PROLACTIN" Lab Results  Component Value Date   CHOL 170 01/08/2023   TRIG 109 01/08/2023   HDL 48 01/08/2023   CHOLHDL 3.2 02/18/2019   VLDL 23 02/18/2019    LDLCALC 102 (H) 01/08/2023   LDLCALC 127 (H) 11/27/2021   Lab Results  Component Value Date   TSH 0.641 01/08/2023   TSH 2.200 03/21/2021    Therapeutic Level Labs: No results found for: "LITHIUM" No results found for: "VALPROATE" No results found for: "CBMZ"  Current Medications: Current Outpatient Medications  Medication Sig Dispense Refill   Albuterol Sulfate (PROAIR RESPICLICK) 108 (90 Base) MCG/ACT AEPB Inhale 2 puffs into the lungs every 4 (four) hours as needed. 1 each 1   ALPRAZolam (XANAX) 1 MG tablet May take 1 tablet (1 mg total) by mouth 2 (two) times daily as needed for anxiety. May also take 0.5 tablets (0.5 mg total) daily as needed for anxiety. 75 tablet 1   buPROPion (WELLBUTRIN SR) 150 MG 12 hr tablet Take 1 tablet by mouth daily. 30 tablet 0   chlorthalidone (HYGROTON) 25 MG tablet Take 1 tablet (25 mg) by mouth daily. 30 tablet 0   Dulaglutide (TRULICITY) 3 MG/0.5ML SOPN Inject 3 mg (0.5 ml) as directed once a week. 2 mL 0   esomeprazole (NEXIUM) 40 MG capsule Take 1 capsule (40 mg total) by mouth daily. 30 capsule 2   fluticasone-salmeterol (ADVAIR) 100-50 MCG/ACT AEPB Inhale 1 puff into the lungs 2 (two) times daily. 60 each 4   HYDROcodone-acetaminophen (NORCO/VICODIN) 5-325 MG tablet Take 1 tablet by mouth twice a day as needed. 10 tablet 0   [START ON 04/08/2023] LATUDA 40 MG TABS tablet Take 1 tablet (40 mg total) by mouth daily with breakfast. 90 tablet 0   loratadine (CLARITIN) 10 MG tablet Take 1 tablet (10 mg total) by mouth daily as needed for allergies. 30 tablet 11   meloxicam (MOBIC) 15 MG tablet Take 1 tablet (15 mg total) by mouth daily. 30 tablet 0   metFORMIN (GLUCOPHAGE) 500 MG tablet Take 1 tablet (500 mg) by mouth 2 times daily with meals. 60 tablet 0  Multiple Vitamin (MULTIVITAMIN) capsule Take 1 capsule by mouth daily.     naloxone (NARCAN) nasal spray 4 mg/0.1 mL Spray 1 dose into ONE nostril every 3 minutes alternate nostrils with each dose  until help arrives 2 each 1   omeprazole (PRILOSEC) 40 MG capsule TAKE 1 CAPSULE BY MOUTH DAILY. 30 capsule 2   oxyCODONE-acetaminophen (PERCOCET) 10-325 MG tablet Take 1 tablet by mouth 4 (four) times daily as needed. 120 tablet 0   oxyCODONE-acetaminophen (PERCOCET) 10-325 MG tablet Take 1 tablet by mouth 4 times daily as needed 120 tablet 0   oxyCODONE-acetaminophen (PERCOCET) 10-325 MG tablet Take 1 tablet by mouth 4 (four) times daily as needed. 120 tablet 0   potassium chloride (KLOR-CON M) 10 MEQ tablet Take 1 tablet by mouth with food once daily 10 tablet 0   propranolol (INDERAL) 40 MG tablet Take 40 mg by mouth daily as needed (heart palpitation.).     topiramate (TOPAMAX) 50 MG tablet Take 1.5 tablets (75 mg total) by mouth at bedtime. (Patient not taking: Reported on 02/09/2023) 45 tablet 5   venlafaxine XR (EFFEXOR-XR) 75 MG 24 hr capsule Take 3 capsules (225 mg total) by mouth daily with breakfast. 270 capsule 1   Vitamin D, Ergocalciferol, (DRISDOL) 1.25 MG (50000 UNIT) CAPS capsule Take 1 capsule by mouth every 7 days. 5 capsule 0   zolpidem (AMBIEN) 10 MG tablet Take 1 tablet (10 mg total) by mouth at bedtime as needed for sleep. 30 tablet 2   No current facility-administered medications for this visit.     Musculoskeletal: Strength & Muscle Tone:  N/A Gait & Station:  N/A Patient leans: N/A  Psychiatric Specialty Exam: Review of Systems  Psychiatric/Behavioral:  Positive for decreased concentration, dysphoric mood and sleep disturbance. Negative for agitation, behavioral problems, confusion, hallucinations, self-injury and suicidal ideas. The patient is nervous/anxious. The patient is not hyperactive.   All other systems reviewed and are negative.   There were no vitals taken for this visit.There is no height or weight on file to calculate BMI.  General Appearance: Fairly Groomed  Eye Contact:  Fair  Speech:  Clear and Coherent  Volume:  Normal  Mood:  Depressed   Affect:  Appropriate, Congruent, and irritable  Thought Process:  Coherent  Orientation:  Full (Time, Place, and Person)  Thought Content: Logical   Suicidal Thoughts:  No  Homicidal Thoughts:  No  Memory:  Immediate;   Good  Judgement:  Good  Insight:  Present  Psychomotor Activity:  Normal  Concentration:  Concentration: Good and Attention Span: Good  Recall:  Good  Fund of Knowledge: Good  Language: Good  Akathisia:  No  Handed:  Right  AIMS (if indicated): not done  Assets:  Communication Skills Desire for Improvement  ADL's:  Intact  Cognition: WNL  Sleep:  Poor   Screenings: GAD-7    Flowsheet Row Office Visit from 04/22/2018 in [facility] Office Visit from 06/03/2017 in [facility] Office Visit from 09/24/2016 in [facility] Office Visit from 03/27/2016 in [facility]  Total GAD-7 Score 11 3 15 16       PHQ2-9    Flowsheet Row Video Visit from 09/23/2021 in [facility] Video Visit from 04/09/2021 in [facility] Office Visit from 03/21/2021 in [facility] Video Visit  from 11/07/2020 in [facility] Office Visit from 04/22/2018 in [facility]  PHQ-2 Total Score 4 6 1 4 5   PHQ-9 Total Score 9 11 4 11 15       Flowsheet Row ED from 03/11/2023 in [facility] ED from 10/16/2022 in [facility] Video Visit from 01/08/2021 in [facility]  C-SSRS RISK CATEGORY No Risk No Risk No Risk        Assessment and Plan:  RAEL TILLY is a 62 y.o. year old female with a history of depression, OCD, estrogen negative T2 pN1, stage IIB invasive ductal carcinoma,  diagnosed 02/25/2018 s/p mastectomy 08/2018, s/p neoadjuvant chemo immunotherapy/on trastzumab, asthma, OA, chronic back pain, who presents for follow up appointment for below.   1. MDD (major depressive disorder), recurrent episode, moderate (HCC) 2. Generalized anxiety disorder Acute stressors include: brother with substance use, her father with alzheimer  Other stressors include:  father in [place], who struggles with loneliness, unemployment, chronic pain   History: Tx from [facility], she declines therapy, in person visit. Originally on venlafaxine 225 mg daily, Buspar 15 mg three times a day, xanax 3.5 mg per day, Ambien 10 mg at night    The exam is notable for her irritability, and she appeared guarded throughout.  She continues to experience depressive symptoms and anxiety in the context of stressors as above.  She has decided to transfer the care to other provider.  I believe this will be beneficial for her given challenges in establishing therapeutic alliance.  Will continue current dose of venlafaxine, Latuda to target depression.  Will hold mirtazapine at this time given she reports limited benefit from this medication, although she may try higher dose in the future if applicable.  Will continue current dose of Xanax at this time for anxiety.  Despite repeated discussions about the therapy, she remains uninterested.  3. Benzodiazepine dependence (HCC) - reduced from 1 mg TID 11/2022   She continues to experience occasional panic attacks.  Noted that the dose has been reduced a few months ago after she had mechanical fall, which she attributed to uneven floor.  Although further tapering down will be beneficial due to risk of fall, respiratory suppression with concomitant use of opioid, will continue the current dose at this time given her care will be transferred.  She agrees that the refill will be sent after this writer is able to review UDS.  She also expressed understanding to contact the new  provider for appointment given anticipated long waiting list.  She agrees to contact her primary care provider if she runs out of her medication before establishing the care to avoid benzodiazepine withdrawal.   # Insomnia - reduced 11/2022    - she declines referral for sleep evaluation    # inattention Unchanged. She has a history of worsening in concentration over the past few years before chemotherapy.  No history of IEP/ADHD.  The etiology is likely multifactorial given her ongoing mood symptoms.  She is not interested in a neuropsychological evaluation.      Plan Continue venlafaxine 225 mg daily  Continue Latuda 40 mg daily  Hold mirtazapine  Continue Xanax 1 mg bidprn and 0.5 mg daily prn for anxiety  Continue Ambien 10 mg at night as needed for insomnia  Obtain record/UDS result from [facility]. She expressed understanding that total of 3 months of  Xanax, Ambien from today will be prescribed after reviewing the result  Next appointment: N/A. She has decided to transfer her care to the other provider. She agrees to make an appointment given anticipated long waiting list.  - on oxycodone 10-325 qid     Past trials of medication: sertraline, fluoxetine, lexapro, Buspar, venlafaxine, duloxetine, bupropion, quetiapine, Abilify, gabapentin   The patient demonstrates the following risk factors for suicide: Chronic risk factors for suicide include: psychiatric disorder of depression and medical illness of breast cancer. Acute risk factors for suicide include: unemployment and loss (financial, interpersonal, professional). Protective factors for this patient include: positive social support, responsibility to others (children, family), coping skills and hope for the future. Considering these factors, the overall suicide risk at this point appears to be low. Patient is appropriate for outpatient follow up.   I have utilized the [place] Controlled Substances Reporting System (PMP AWARxE)  to confirm adherence regarding the patient's medication. My review reveals appropriate prescription fills.     Collaboration of Care: Collaboration of Care: Other reviewed notes in Epic  Patient/Guardian was advised Release of Information must be obtained prior to any record release in order to collaborate their care with an outside provider. Patient/Guardian was advised if they have not already done so to contact the registration department to sign all necessary forms in order for [place] to release information regarding their care.   Consent: Patient/Guardian gives verbal consent for treatment and assignment of benefits for services provided during this visit. Patient/Guardian expressed understanding and agreed to proceed.    Neysa Hotter, MD 03/16/2023, 1:46 PM

## 2023-03-11 NOTE — Discharge Instructions (Signed)

## 2023-03-11 NOTE — ED Notes (Signed)
Pt discharged with  AVS.  AVS reviewed prior to discharge.  Pt alert, oriented, and ambulatory.  Safety maintained.  °

## 2023-03-11 NOTE — ED Provider Notes (Signed)
Behavioral Health Urgent Care Medical Screening Exam  Patient Name: Jacqueline Good MRN: [id number] Date of Evaluation: 03/11/23 Chief Complaint:   Diagnosis:  Final diagnoses:  Generalized anxiety disorder    History of Present illness: Jacqueline Good is a 62 y.o. female. Patient presents voluntarily to [facility] for walk-in assessment.   Patient is assessed, face-to-face, by nurse practitioner. She is seated in assessment area, no acute distress.  Patient is alert and oriented, pleasant and cooperative during assessment.    Chart reviewed and patient discussed with Dr. Nelly Rout on 03/11/2023.  Tyera states "I need medication management, I do not know if I want to see my provider anymore, I know that I do not."  Patient reports she is uncertain whether she is diagnosed with bipolar disorder or major depressive disorder.  She has discussed both diagnoses with outpatient provider previously.  She reports ongoing difficulty with falling asleep.  This has gone on for several years.  She is compliant with current medications including Ambien, mirtazapine and Latuda.  She denies history of inpatient psychiatric hospitalization.  Family mental health history includes patient's brother who has been diagnosed with addiction disorder.  Patient currently followed by outpatient psychiatry Behavioral health outpatient, Dr. Vanetta Shawl.  She is not linked with individual counseling currently.  She does not want to be linked with individual counseling moving forward.  Patient states "I just need medication management."    Patient  presents with euthymic mood, congruent affect. She  denies suicidal and homicidal ideations. Denies history of suicide attempts, denies history of nonsuicidal self-harm behaviors.  Patient easily  contracts verbally for safety with this Clinical research associate.    Patient has normal speech and behavior.  She  denies auditory and visual hallucinations.  Patient is able  to converse coherently with goal-directed thoughts and no distractibility or preoccupation.  Denies symptoms of paranoia.  Objectively there is no evidence of psychosis/mania or delusional thinking.  Jacqueline Good resides in [place], she denies access to weapons. She receives disability income. She denies alcohol and substance use. Patient endorses average appetite.  Patient offered support and encouragement.  She declines any person to contact for collateral information at this time.  Continuous observation admission offered, patient declines.   Patient educated and verbalizes understanding of mental health resources and other crisis services in the community. They are instructed to call 911 and present to the nearest emergency room should patient experience any suicidal/homicidal ideation, auditory/visual/hallucinations, or detrimental worsening of mental health condition.       Flowsheet Row ED from 03/11/2023 in [facility] ED from 10/16/2022 in [facility] Video Visit from 01/08/2021 in confidential department  C-SSRS RISK CATEGORY No Risk No Risk No Risk       Psychiatric Specialty Exam  Presentation  General Appearance:Appropriate for Environment; Casual  Eye Contact:Good  Speech:Clear and Coherent; Normal Rate  Speech Volume:Normal  Handedness:Right   Mood and Affect  Mood: Euthymic  Affect: Appropriate; Congruent   Thought Process  Thought Processes: Coherent; Goal Directed; Linear  Descriptions of Associations:Intact  Orientation:Full (Time, Place and Person)  Thought Content:Logical; WDL    Hallucinations:None  Ideas of Reference:None  Suicidal Thoughts:No  Homicidal Thoughts:No   Sensorium  Memory: Immediate Good; Recent Good  Judgment: Good  Insight: Good   Executive Functions  Concentration: Good  Attention Span: Good  Recall: Good  Fund of  Knowledge: Good  Language: Good   Psychomotor Activity  Psychomotor Activity:  Restlessness   Assets  Assets: Manufacturing systems engineer; Desire for Improvement; Financial Resources/Insurance; Housing; Resilience; Social Support   Sleep  Sleep: Fair  Number of hours: No data recorded  Physical Exam: Physical Exam Vitals and nursing note reviewed.  Constitutional:      Appearance: Normal appearance. She is well-developed.  HENT:     Head: Normocephalic and atraumatic.     Nose: Nose normal.  Cardiovascular:     Rate and Rhythm: Normal rate.  Pulmonary:     Effort: Pulmonary effort is normal.  Musculoskeletal:        General: Normal range of motion.     Cervical back: Normal range of motion.  Skin:    General: Skin is warm and dry.  Neurological:     Mental Status: She is alert and oriented to person, place, and time.  Psychiatric:        Attention and Perception: Attention and perception normal.        Mood and Affect: Mood and affect normal.        Speech: Speech normal.        Behavior: Behavior normal. Behavior is cooperative.        Thought Content: Thought content normal.        Cognition and Memory: Cognition and memory normal.    Review of Systems  Constitutional: Negative.   HENT: Negative.    Eyes: Negative.   Respiratory: Negative.    Cardiovascular: Negative.   Gastrointestinal: Negative.   Genitourinary: Negative.   Musculoskeletal:  Positive for back pain.  Skin: Negative.   Neurological: Negative.   Psychiatric/Behavioral: Negative.     Blood pressure (!) 146/91, pulse 71, resp. rate 19, SpO2 100%. There is no height or weight on file to calculate BMI.  Musculoskeletal: Strength & Muscle Tone: within normal limits Gait & Station: normal Patient leans: N/A   [facility] MSE Discharge Disposition for Follow up and Recommendations: Based on my evaluation the patient does not appear to have an emergency medical condition and can be discharged with  resources and follow up care in outpatient services for Medication Management Follow-up with outpatient psychiatry, resources provided.  Continue current medications.  Lenard Lance, FNP 03/11/2023, 4:26 PM

## 2023-03-16 ENCOUNTER — Ambulatory Visit: Payer: 59 | Admitting: Bariatrics

## 2023-03-16 ENCOUNTER — Telehealth: Payer: 59 | Admitting: Psychiatry

## 2023-03-16 ENCOUNTER — Other Ambulatory Visit (HOSPITAL_COMMUNITY): Payer: Self-pay

## 2023-03-16 ENCOUNTER — Encounter: Payer: Self-pay | Admitting: Psychiatry

## 2023-03-16 DIAGNOSIS — F411 Generalized anxiety disorder: Secondary | ICD-10-CM

## 2023-03-16 DIAGNOSIS — F331 Major depressive disorder, recurrent, moderate: Secondary | ICD-10-CM | POA: Diagnosis not present

## 2023-03-16 DIAGNOSIS — F1320 Sedative, hypnotic or anxiolytic dependence, uncomplicated: Secondary | ICD-10-CM | POA: Diagnosis not present

## 2023-03-16 DIAGNOSIS — G4700 Insomnia, unspecified: Secondary | ICD-10-CM | POA: Diagnosis not present

## 2023-03-16 MED ORDER — LATUDA 40 MG PO TABS
40.0000 mg | ORAL_TABLET | Freq: Every day | ORAL | 0 refills | Status: DC
  Filled 2023-03-16: qty 90, 90d supply, fill #0

## 2023-03-16 MED ORDER — ZOLPIDEM TARTRATE 10 MG PO TABS
10.0000 mg | ORAL_TABLET | Freq: Every evening | ORAL | 2 refills | Status: AC | PRN
  Filled 2023-03-16 – 2023-03-17 (×2): qty 30, 30d supply, fill #0
  Filled 2023-05-13: qty 30, 30d supply, fill #1

## 2023-03-16 NOTE — Patient Instructions (Signed)
Continue venlafaxine 225 mg daily  Continue Latuda 40 mg daily  Hold mirtazapine  Continue Xanax 1 mg bidprn and 0.5 mg daily prn for anxiety  Continue Ambien 10 mg at night as needed for insomnia  Obtain record from [facility]. A refill of Xanax will be prescribed after reviewing the results, provided there are no concerns. Please contact the psychiatrist for the transfer of care. Please reach out to your primary care provider if you need an additional refill.

## 2023-03-17 ENCOUNTER — Other Ambulatory Visit: Payer: Self-pay

## 2023-03-17 ENCOUNTER — Other Ambulatory Visit (HOSPITAL_COMMUNITY): Payer: Self-pay

## 2023-03-19 ENCOUNTER — Ambulatory Visit (INDEPENDENT_AMBULATORY_CARE_PROVIDER_SITE_OTHER): Payer: 59 | Admitting: Bariatrics

## 2023-03-19 ENCOUNTER — Encounter: Payer: Self-pay | Admitting: Bariatrics

## 2023-03-19 ENCOUNTER — Other Ambulatory Visit (HOSPITAL_COMMUNITY): Payer: Self-pay

## 2023-03-19 DIAGNOSIS — K5900 Constipation, unspecified: Secondary | ICD-10-CM

## 2023-03-19 DIAGNOSIS — I10 Essential (primary) hypertension: Secondary | ICD-10-CM

## 2023-03-19 DIAGNOSIS — Z6830 Body mass index (BMI) 30.0-30.9, adult: Secondary | ICD-10-CM

## 2023-03-19 DIAGNOSIS — E88819 Insulin resistance, unspecified: Secondary | ICD-10-CM

## 2023-03-19 DIAGNOSIS — R632 Polyphagia: Secondary | ICD-10-CM

## 2023-03-19 DIAGNOSIS — E559 Vitamin D deficiency, unspecified: Secondary | ICD-10-CM | POA: Diagnosis not present

## 2023-03-19 DIAGNOSIS — Z6834 Body mass index (BMI) 34.0-34.9, adult: Secondary | ICD-10-CM

## 2023-03-19 DIAGNOSIS — E669 Obesity, unspecified: Secondary | ICD-10-CM

## 2023-03-19 DIAGNOSIS — F3289 Other specified depressive episodes: Secondary | ICD-10-CM

## 2023-03-19 MED ORDER — VITAMIN D (ERGOCALCIFEROL) 1.25 MG (50000 UNIT) PO CAPS
50000.0000 [IU] | ORAL_CAPSULE | ORAL | 0 refills | Status: DC
Start: 2023-03-19 — End: 2023-04-27
  Filled 2023-03-19: qty 5, 35d supply, fill #0

## 2023-03-19 MED ORDER — CHLORTHALIDONE 25 MG PO TABS
25.0000 mg | ORAL_TABLET | Freq: Every day | ORAL | 0 refills | Status: DC
Start: 2023-03-19 — End: 2023-04-27
  Filled 2023-03-19: qty 30, 30d supply, fill #0

## 2023-03-19 MED ORDER — METFORMIN HCL 500 MG PO TABS
500.0000 mg | ORAL_TABLET | Freq: Two times a day (BID) | ORAL | 0 refills | Status: DC
Start: 2023-03-19 — End: 2023-04-27
  Filled 2023-03-19: qty 60, 30d supply, fill #0

## 2023-03-19 MED ORDER — TRULICITY 4.5 MG/0.5ML ~~LOC~~ SOAJ
4.5000 mg | SUBCUTANEOUS | 0 refills | Status: DC
  Filled 2023-03-19: qty 2, 28d supply, fill #0

## 2023-03-19 MED ORDER — BUPROPION HCL ER (SR) 150 MG PO TB12
150.0000 mg | ORAL_TABLET | Freq: Every day | ORAL | 0 refills | Status: DC
Start: 2023-03-19 — End: 2023-05-26
  Filled 2023-03-19: qty 30, 30d supply, fill #0

## 2023-03-19 NOTE — Progress Notes (Signed)
WEIGHT SUMMARY AND BIOMETRICS  Weight Lost Since Last Visit: 1lb  Vitals Temp: 98 F (36.7 C) BP: 121/78 Pulse Rate: 78 SpO2: 97 %   Anthropometric Measurements Height: 5\' 4"  (1.626 m) Weight: 180 lb (81.6 kg) BMI (Calculated): 30.88 Weight at Last Visit: 181lb Weight Lost Since Last Visit: 1lb Starting Weight: 238lb Total Weight Loss (lbs): 58 lb (26.3 kg)   Body Composition  Body Fat %: 44.1 % Fat Mass (lbs): 79.4 lbs Muscle Mass (lbs): 95.4 lbs Total Body Water (lbs): 68.8 lbs Visceral Fat Rating : 12   Other Clinical Data Fasting: no Labs: no Today's Visit #: 26 Starting Date: 03/21/21    OBESITY Jacqueline Good is here to discuss her progress with her obesity treatment plan along with follow-up of her obesity related diagnoses.     Nutrition Plan: the Category 1 plan - 70% adherence.  Current exercise: none  Interim History:  She is down 1 additional lb since her last visit. She is having more craving for certain foods. She will normally have some protein and low sugar fruits.  Protein intake is as prescribed, Is skipping meals, and Water intake is adequate.  Pharmacotherapy: Jacqueline Good is on Metformin 500 mg twice daily with meals Adverse side effects: None Hunger is moderately controlled.  Cravings are moderately controlled.  Assessment/Plan:    1. Vitamin D deficiency  Vitamin D is at goal of 50.  Most recent vitamin D level was 58.3. She is on  prescription ergocalciferol 50,000 IU weekly. Lab Results  Component Value Date   VD25OH 58.3 01/08/2023   VD25OH 33.2 11/27/2021   VD25OH 14.4 (L) 03/21/2021    Plan: Refill prescription vitamin D 50,000 IU weekly.    2. Insulin resistance Insulin Resistance Jacqueline Good has had elevated fasting insulin readings. Goal is HgbA1c < 5.7, fasting insulin at l0 or less, and preferably at 5.  She reports some polyphagia, and cravings.  Medication(s): Trulicity and Metformin.  Lab Results   Component Value Date   HGBA1C 5.3 01/08/2023   Lab Results  Component Value Date   INSULIN 9.2 01/08/2023   INSULIN 11.7 11/27/2021   INSULIN 31.4 (H) 03/21/2021    Plan Medication(s): Metformin 500 mg twice daily with meals, and will increase her Trulicity form 3.0 to 4.5 mg daily.  Will minimize refined carbohydrates ( sweets and starches), and focus more on complex carbohydrates.  Increase the micronutrients found in leafy greens, which include magnesium, polyphenols, and vitamin C which have been postulated to help with insulin sensitivity. Minimize "fast food" and cook more meals at home.  Increase fiber to 25 to 30 grams daily.  Information sheet on " Belly fat ".   3. Polyphagia Polyphagia Jacqueline Good endorses excessive hunger.  Medication(s): Metformin and Trulicity Effects of medication:  moderately controlled. Cravings are moderately controlled.   Plan: Medication(s): Trulicity 4.5 mg SQ weekly (increase from 3 mg dose).  Will increase water, protein and fiber to help assuage hunger.  Will minimize foods that have a high glucose index/load to minimize reactive hypoglycemia.     4. Essential hypertension Hypertension Hypertension borderline controlled. She is typically controlled, but her blood pressure may be up secondary to driving in bad weather conditions.  Medication(s): Chlorthalidone 25 mg  BP Readings from Last 3 Encounters:  03/19/23 121/78  02/09/23 114/76  01/13/23 135/83   Lab Results  Component Value Date   CREATININE 0.95 01/13/2023   CREATININE 0.92 01/08/2023   CREATININE 1.02 (H) 12/26/2021   No results  found for: "GFR"  Plan:  Rx for Chlorthalidone 25 mg 1 daily # 30 with no refills.  Continue all antihypertensives at current dosages. No added salt. Will keep sodium content to 1,500 mg or less per day.    5. Other depression, emotional eating Eating disorder/emotional eating Jacqueline Good has had issues with stress eating, emotional eating,  and nighttime eating. Currently this is moderately controlled. Overall mood is stable. Denies suicidal/homicidal ideation. Medication(s): Topamax   Plan:  Specifically regarding patient's less desirable eating habits and patterns, we employed the technique of small changes when she cannot fully commit to her prudent nutritional plan. Consider a referral to Dr. Dewaine Conger, PhD psychologist to help with eating behaviors.  Discussed distractions to curb eating behaviors. Discussed activities to do with one's hands in the evening  Be sure to get adequate rest as lack of rest can trigger appetite.  Have plan in place for stressful events.  Consider other rewards besides food.     6. Constipation:   She takes pain medication and experiences severe constipation.   Plan: Will begin Miralax daily (Rx written) , and will begin Magnesium 400 mg OTC daily at night.    Generalized Obesity: Current BMI BMI (Calculated): 30.88   Pharmacotherapy Plan Continue and refill  Metformin 500 mg twice daily with meals  Jacqueline Good is currently in the action stage of change. As such, her goal is to continue with weight loss efforts.  She has agreed to the Category 1 plan.  Exercise goals: All adults should avoid inactivity. Some physical activity is better than none, and adults who participate in any amount of physical activity gain some health benefits.  Behavioral modification strategies: decrease junk food, get rid of junk food in the home, ways to avoid night time snacking, and avoiding temptations.  Jacqueline Good has agreed to follow-up with our clinic in 4 weeks.      Objective:   VITALS: Per patient if applicable, see vitals. GENERAL: Alert and in no acute distress. CARDIOPULMONARY: No increased WOB. Speaking in clear sentences.  PSYCH: Pleasant and cooperative. Speech normal rate and rhythm. Affect is appropriate. Insight and judgement are appropriate. Attention is focused, linear, and appropriate.   NEURO: Oriented as arrived to appointment on time with no prompting.   Attestation Statements:   This was prepared with the assistance of Engineer, civil (consulting).  Occasional wrong-word or sound-a-like substitutions may have occurred due to the inherent limitations of voice recognition software.   [name], DO

## 2023-03-25 ENCOUNTER — Telehealth: Payer: Self-pay | Admitting: Psychiatry

## 2023-03-25 NOTE — Telephone Encounter (Signed)
called patient. pt states that she will call back if she needs you to send the rx and for any other appointments. pt states she has test done every month at the pain clinic. but right now she may not come back or have you do any rx for her.

## 2023-03-25 NOTE — Telephone Encounter (Signed)
During our last visit, we discussed obtaining a ROI to get the records/results of the urine drug screening from her pain clinic. I don't see the form on file. Could you please contact the patient to follow up on this? It's very important so that I can proceed with ordering Xanax refills. Thanks.

## 2023-03-25 NOTE — Telephone Encounter (Signed)
During the visit, I discussed with the patient the need to send [place] her UDS results/records so that I can order a refill for longer than a month to avoid benzodiazepine withdrawal. I will not plan to provide any refills based on her comments.

## 2023-03-30 ENCOUNTER — Other Ambulatory Visit (HOSPITAL_COMMUNITY): Payer: Self-pay

## 2023-03-30 MED ORDER — OXYCODONE-ACETAMINOPHEN 10-325 MG PO TABS
1.0000 | ORAL_TABLET | Freq: Four times a day (QID) | ORAL | 0 refills | Status: DC | PRN
  Filled 2023-03-30: qty 120, 30d supply, fill #0

## 2023-03-31 ENCOUNTER — Other Ambulatory Visit: Payer: Self-pay

## 2023-04-02 NOTE — Progress Notes (Deleted)
NEUROLOGY FOLLOW UP OFFICE NOTE  Jacqueline Good [id number]  Assessment/Plan:   Chronic tension type headache, not intractable - likely triggered by underlying stress (worried about her elderly parents)   MRI of brain ordered by her PCP scheduled for tomorrow. Increase topiramate to 75mg  at bedtime.  If no improvement in 4 weeks, we can increase to 100mg  at bedtime.  If ineffective, other options would be to add tizanidine three times daily or switch from mirtazapine to nortriptyline.   We discussed risk of rebound headache.  She is prescribed opioids for chronic back pain. Keep headache diary Follow up 4-5 months.  Subjective:  Jacqueline Good is a 62 year old right-handed female with chronic back pain and anxiety who follows up for headache  UPDATE: MRI of brain without contrast on 10/04/2022 personally reviewed showed minor for age FLAIR hyperintensity in the periventricular white matter but overall unremarkable.  Increased topiramate.  *** Intensity:  *** Duration:  *** Frequency:  *** Frequency of abortive medication: *** Current NSAIDS/analgesics:  Tylenol, Norco, oxycodone Current triptans:  none Current ergotamine:  none Current anti-emetic:  none Current muscle relaxants:  none Current Antihypertensive medications:  propranolol 40mg  daily PRN (palpitations) Current Antidepressant medications:  venlafaxine 225mg  daily, bupropion Current Anticonvulsant medications:  none Current anti-CGRP:  none Current Vitamins/Herbal/Supplements:  MVI Current Antihistamines/Decongestants:  Claritin Other therapy:  none Birth control:  none Other medications:  Ambien, Latuda, Xanax, Narcan     Caffeine:  Coffee on weekends.  No soda or tea Diet:  Drinks plenty of water.   Exercise:  cannot due to back pain Depression:  yes; Anxiety:  yes.  As stated above. Other pain:  chronic back pain Sleep hygiene:  Good but sometimes interrupted sleep.  "Brain won't shut down".  Takes  mirtazapine.  HISTORY:  Onset:  2 months ago.  No prior history. No preceding head trauma.  However, she reports a lot of stress.  More recently, in particular, she is worried about losing her parents as they are elderly.   Location:  around the eyes, maxillary regions, started radiating to back of head and front of head Quality:  throbbing Intensity:  moderate Aura:  absent Prodrome:  absent Associated symptoms:  on occasion, photophobia.  She denies associated unilateral numbness or weakness.  In general, vision seems a little more blurred.  Has upcoming eye appointment Duration:  20 minutes Frequency:  They were occurring 3-4 times a day, now 1 to 2 times a day. Triggers:  wearing hats Relieving factors:  no Activity:  able to function   She has an MRI of the brain scheduled tomorrow.     Past NSAIDS/analgesics:  meloxicam, naproxen Past abortive triptans:  none Past abortive ergotamine:  none Past muscle relaxants:  tizanidine Past anti-emetic:  none Past antihypertensive medications:  none Past antidepressant medications:  Cymbalta, mirtazapine Past anticonvulsant medications:  topiramate ***, gabapentin Past anti-CGRP:  none      Family history of headache:  no  PAST MEDICAL HISTORY: Past Medical History:  Diagnosis Date   Anemia    Anxiety    Arthritis    "all over" (08/12/2018)   Asthma    Back pain    Benign paroxysmal positional vertigo 10/31/2014   Breast cancer, right breast (HCC) dx'd 02/2018   Chronic bronchitis (HCC)    Depression    Family history of breast cancer    Family history of prostate cancer    Family history of uterine cancer  GERD (gastroesophageal reflux disease)    History of blood transfusion    "several; related to low blood" (08/12/2018)   History of radiation therapy 11/15/18- 12/17/18   Right Chest wall, SCV, PAB. 25 fractions.    Lymphedema of right arm    Palpitations    Personal history of chemotherapy    Personal history of  radiation therapy     MEDICATIONS: Current Outpatient Medications on File Prior to Visit  Medication Sig Dispense Refill   Albuterol Sulfate (PROAIR RESPICLICK) 108 (90 Base) MCG/ACT AEPB Inhale 2 puffs into the lungs every 4 (four) hours as needed. 1 each 1   ALPRAZolam (XANAX) 1 MG tablet May take 1 tablet (1 mg total) by mouth 2 (two) times daily as needed for anxiety. May also take 0.5 tablets (0.5 mg total) daily as needed for anxiety. 75 tablet 1   buPROPion (WELLBUTRIN SR) 150 MG 12 hr tablet Take 1 tablet by mouth daily. 30 tablet 0   chlorthalidone (HYGROTON) 25 MG tablet Take 1 tablet (25 mg) by mouth daily. 30 tablet 0   Dulaglutide (TRULICITY) 4.5 MG/0.5ML SOPN Inject 4.5 mg subcutaneously once a week as directed. 2 mL 0   esomeprazole (NEXIUM) 40 MG capsule Take 1 capsule (40 mg total) by mouth daily. 30 capsule 2   fluticasone-salmeterol (ADVAIR) 100-50 MCG/ACT AEPB Inhale 1 puff into the lungs 2 (two) times daily. 60 each 4   HYDROcodone-acetaminophen (NORCO/VICODIN) 5-325 MG tablet Take 1 tablet by mouth twice a day as needed. 10 tablet 0   [START ON 04/08/2023] LATUDA 40 MG TABS tablet Take 1 tablet (40 mg total) by mouth daily with breakfast. 90 tablet 0   loratadine (CLARITIN) 10 MG tablet Take 1 tablet (10 mg total) by mouth daily as needed for allergies. 30 tablet 11   meloxicam (MOBIC) 15 MG tablet Take 1 tablet (15 mg total) by mouth daily. 30 tablet 0   metFORMIN (GLUCOPHAGE) 500 MG tablet Take 1 tablet (500 mg) by mouth 2 times daily with meals. 60 tablet 0   Multiple Vitamin (MULTIVITAMIN) capsule Take 1 capsule by mouth daily.     naloxone (NARCAN) nasal spray 4 mg/0.1 mL Spray 1 dose into ONE nostril every 3 minutes alternate nostrils with each dose until help arrives 2 each 1   omeprazole (PRILOSEC) 40 MG capsule TAKE 1 CAPSULE BY MOUTH DAILY. 30 capsule 2   oxyCODONE-acetaminophen (PERCOCET) 10-325 MG tablet Take 1 tablet by mouth 4 (four) times daily as needed. 120  tablet 0   oxyCODONE-acetaminophen (PERCOCET) 10-325 MG tablet Take 1 tablet by mouth 4 times daily as needed 120 tablet 0   oxyCODONE-acetaminophen (PERCOCET) 10-325 MG tablet Take 1 tablet by mouth 4 (four) times daily as needed. 120 tablet 0   oxyCODONE-acetaminophen (PERCOCET) 10-325 MG tablet Take 1 tablet by mouth 4 (four) times daily as needed. 120 tablet 0   potassium chloride (KLOR-CON M) 10 MEQ tablet Take 1 tablet by mouth with food once daily 10 tablet 0   propranolol (INDERAL) 40 MG tablet Take 40 mg by mouth daily as needed (heart palpitation.).     topiramate (TOPAMAX) 50 MG tablet Take 1.5 tablets (75 mg total) by mouth at bedtime. 45 tablet 5   venlafaxine XR (EFFEXOR-XR) 75 MG 24 hr capsule Take 3 capsules (225 mg total) by mouth daily with breakfast. 270 capsule 1   Vitamin D, Ergocalciferol, (DRISDOL) 1.25 MG (50000 UNIT) CAPS capsule Take 1 capsule by mouth every 7 days. 5 capsule  0   zolpidem (AMBIEN) 10 MG tablet Take 1 tablet (10 mg) by mouth at bedtime as needed for sleep. 30 tablet 2   No current facility-administered medications on file prior to visit.    ALLERGIES: Allergies  Allergen Reactions   Gadolinium Derivatives Itching and Cough    Pt began sneezing and coughing as soon as MRI contrast was injected. Severe nasal congestion. No rash or hives no SOB.    Hydrocodone Hives and Itching    Pt says she can take it with benadryl.    Iodinated Contrast Media Itching    FAMILY HISTORY: Family History  Problem Relation Age of Onset   Lupus Sister    Heart disease Sister    Breast cancer Mother 26   Prostate cancer Paternal Uncle    Diabetes Maternal Grandmother    Heart Problems Maternal Grandmother    Prostate cancer Maternal Grandfather    Prostate cancer Paternal Grandfather    Prostate cancer Other        MGFs brother   Breast cancer Other        Mat great-grandmother's sister   Uterine cancer Maternal Great-grandmother    Prostate cancer Other         PGFs brother      Objective:  *** General: No acute distress.  Patient appears ***-groomed.   Head:  Normocephalic/atraumatic Eyes:  Fundi examined but not visualized Neck: supple, no paraspinal tenderness, full range of motion Heart:  Regular rate and rhythm Lungs:  Clear to auscultation bilaterally Back: No paraspinal tenderness Neurological Exam: alert and oriented.  Speech fluent and not dysarthric, language intact.  CN II-XII intact. Bulk and tone normal, muscle strength 5/5 throughout.  Sensation to light touch intact.  Deep tendon reflexes 2+ throughout, toes downgoing.  Finger to nose testing intact.  Gait normal, Romberg negative.   Shon Millet, DO  CC: ***

## 2023-04-03 ENCOUNTER — Ambulatory Visit: Payer: 59 | Admitting: Neurology

## 2023-04-07 ENCOUNTER — Other Ambulatory Visit: Payer: Self-pay | Admitting: Psychiatry

## 2023-04-07 ENCOUNTER — Other Ambulatory Visit (HOSPITAL_COMMUNITY): Payer: Self-pay

## 2023-04-07 NOTE — Telephone Encounter (Signed)
Previously, she informed us that she did not need this refill. Could you please ask her if this is still the case? She mentioned that she would be transferring her care and, if she needed a refill as a bridge, she would need to send a record or UDS result from her pain clinic. However, we have not received these documents.

## 2023-04-09 ENCOUNTER — Other Ambulatory Visit (HOSPITAL_COMMUNITY): Payer: Self-pay

## 2023-04-10 ENCOUNTER — Telehealth: Payer: Self-pay | Admitting: Psychiatry

## 2023-04-10 ENCOUNTER — Other Ambulatory Visit (HOSPITAL_COMMUNITY): Payer: Self-pay

## 2023-04-10 MED ORDER — ALPRAZOLAM 1 MG PO TABS
ORAL_TABLET | ORAL | 1 refills | Status: AC
  Filled 2023-04-10: qty 75, 30d supply, fill #0
  Filled 2023-05-13: qty 75, 30d supply, fill #1

## 2023-04-10 NOTE — Telephone Encounter (Signed)
Pt.notified

## 2023-04-10 NOTE — Telephone Encounter (Signed)
Please contact the patient to inform her that a two-month supply of Xanax has been ordered. Advise her to schedule an appointment with her new psychiatrist and/or obtain future refills from her PCP, as I will not be able to prescribe any more refills. This was discussed during her last visit.  According to the nurse who spoke with the patient, she needs a xanax refill but is not getting [place] a urine test. She doesn't have another appointment until mid-September at her pain provider. We previously discussed reducing the Xanax dose and having a urine test ordered by me or a UDS report sent from her pain provider. As a courtesy, I initially ordered a one-month refill at the original dose, but she has not pursued either option. To avoid withdrawal symptoms, I will send in another two-month refill, totaling three months of prescriptions since her last visit. No further refills will be provided, as her care will be transferred, which was also discussed during her last visit.

## 2023-04-16 ENCOUNTER — Ambulatory Visit: Payer: 59 | Admitting: Bariatrics

## 2023-04-17 ENCOUNTER — Other Ambulatory Visit (HOSPITAL_COMMUNITY): Payer: Self-pay

## 2023-04-17 MED ORDER — BUPROPION HCL ER (XL) 300 MG PO TB24
ORAL_TABLET | ORAL | 1 refills | Status: DC
  Filled 2023-04-17: qty 30, 30d supply, fill #0
  Filled 2023-05-13: qty 30, 30d supply, fill #1

## 2023-04-17 MED ORDER — VENLAFAXINE HCL ER 75 MG PO CP24
ORAL_CAPSULE | ORAL | 1 refills | Status: AC
  Filled 2023-04-17: qty 90, 30d supply, fill #0
  Filled 2023-05-13: qty 90, 30d supply, fill #1

## 2023-04-17 MED ORDER — LURASIDONE HCL 40 MG PO TABS
40.0000 mg | ORAL_TABLET | Freq: Every evening | ORAL | 1 refills | Status: AC
  Filled 2023-04-17 – 2023-10-15 (×3): qty 30, 30d supply, fill #0

## 2023-04-27 ENCOUNTER — Other Ambulatory Visit (HOSPITAL_COMMUNITY): Payer: Self-pay

## 2023-04-27 ENCOUNTER — Ambulatory Visit (INDEPENDENT_AMBULATORY_CARE_PROVIDER_SITE_OTHER): Payer: 59 | Admitting: Bariatrics

## 2023-04-27 ENCOUNTER — Encounter: Payer: Self-pay | Admitting: Bariatrics

## 2023-04-27 DIAGNOSIS — E669 Obesity, unspecified: Secondary | ICD-10-CM

## 2023-04-27 DIAGNOSIS — I10 Essential (primary) hypertension: Secondary | ICD-10-CM

## 2023-04-27 DIAGNOSIS — E88819 Insulin resistance, unspecified: Secondary | ICD-10-CM

## 2023-04-27 DIAGNOSIS — R632 Polyphagia: Secondary | ICD-10-CM

## 2023-04-27 DIAGNOSIS — E559 Vitamin D deficiency, unspecified: Secondary | ICD-10-CM

## 2023-04-27 DIAGNOSIS — Z87891 Personal history of nicotine dependence: Secondary | ICD-10-CM

## 2023-04-27 DIAGNOSIS — Z6829 Body mass index (BMI) 29.0-29.9, adult: Secondary | ICD-10-CM

## 2023-04-27 DIAGNOSIS — Z6834 Body mass index (BMI) 34.0-34.9, adult: Secondary | ICD-10-CM

## 2023-04-27 DIAGNOSIS — F3289 Other specified depressive episodes: Secondary | ICD-10-CM

## 2023-04-27 MED ORDER — TRULICITY 4.5 MG/0.5ML ~~LOC~~ SOAJ
4.5000 mg | SUBCUTANEOUS | 0 refills | Status: DC
  Filled 2023-04-27: qty 2, 28d supply, fill #0

## 2023-04-27 MED ORDER — CHLORTHALIDONE 25 MG PO TABS
25.0000 mg | ORAL_TABLET | Freq: Every day | ORAL | 0 refills | Status: DC
Start: 2023-04-27 — End: 2023-05-26
  Filled 2023-04-27: qty 30, 30d supply, fill #0

## 2023-04-27 MED ORDER — METFORMIN HCL 500 MG PO TABS
500.0000 mg | ORAL_TABLET | Freq: Two times a day (BID) | ORAL | 0 refills | Status: DC
Start: 2023-04-27 — End: 2023-05-26
  Filled 2023-04-27: qty 60, 30d supply, fill #0

## 2023-04-27 MED ORDER — VITAMIN D (ERGOCALCIFEROL) 1.25 MG (50000 UNIT) PO CAPS
50000.0000 [IU] | ORAL_CAPSULE | ORAL | 0 refills | Status: DC
Start: 2023-04-27 — End: 2023-05-26
  Filled 2023-04-27: qty 5, 35d supply, fill #0

## 2023-04-27 NOTE — Progress Notes (Signed)
WEIGHT SUMMARY AND BIOMETRICS  Weight Lost Since Last Visit: 9lb   Vitals Temp: 97.9 F (36.6 C) BP: 110/76 Pulse Rate: 72 SpO2: 97 %   Anthropometric Measurements Height: 5\' 4"  (1.626 m) Weight: 171 lb (77.6 kg) BMI (Calculated): 29.34 Weight at Last Visit: 180lb Weight Lost Since Last Visit: 9lb Starting Weight: 238lb Total Weight Loss (lbs): 67 lb (30.4 kg)   Body Composition  Body Fat %: 42.9 % Fat Mass (lbs): 73.6 lbs Muscle Mass (lbs): 93 lbs Total Body Water (lbs): 64.2 lbs Visceral Fat Rating : 11   Other Clinical Data Fasting: no Labs: no Today's Visit #: 27 Starting Date: 03/21/21    OBESITY Jacqueline Good is here to discuss her progress with her obesity treatment plan along with follow-up of her obesity related diagnoses.     Nutrition Plan: the Category 1 plan - 70% adherence.  Current exercise: none  Interim History:  She is down 9 lbs since her last visit. Not eating all of the food on the plan., Protein intake is as prescribed, Is not skipping meals, Meeting protein goals., Water intake is adequate., Denies polyphagia, and Denies excessive cravings.  Pharmacotherapy: Jacqueline Good is on Trulicity 4.5 mg SQ weekly and Metformin 500 mg twice daily with meals Adverse side effects: None Hunger is moderately controlled.  Cravings are moderately controlled.  Assessment/Plan:   Vitamin D Deficiency Vitamin D is at goal of 50.  Most recent vitamin D level was 58.3. She is on  prescription ergocalciferol 50,000 IU weekly. Lab Results  Component Value Date   VD25OH 58.3 01/08/2023   VD25OH 33.2 11/27/2021   VD25OH 14.4 (L) 03/21/2021    Plan: Refill prescription vitamin D 50,000 IU weekly.    2.  Insulin Resistance Jacqueline Good has had elevated fasting insulin readings. Goal is HgbA1c < 5.7, fasting insulin at l0 or less, and preferably at 5.  She  denies polyphagia. Medication(s): Trulicity Lab Results  Component Value Date   HGBA1C 5.3  01/08/2023   Lab Results  Component Value Date   INSULIN 9.2 01/08/2023   INSULIN 11.7 11/27/2021   INSULIN 31.4 (H) 03/21/2021    Plan Medication(s): Trulicity 4.5 mg SQ weekly Will work on the agreed upon plan. Will minimize refined carbohydrates ( sweets and starches), and focus more on complex carbohydrates.  Increase the micronutrients found in leafy greens, which include magnesium, polyphenols, and vitamin C which have been postulated to help with insulin sensitivity. Minimize "fast food" and cook more meals at home.  Increase fiber to 25 to 30 grams daily.  Information sheet on " Insulin Resistance and Prediabetes".   Insulin resistance   3. Polyphagia Polyphagia Jacqueline Good endorses excessive hunger.  Medication(s): Trulicity Effects of medication:  well controlled. Cravings are well controlled.   Plan: Medication(s): Trulicity 4.5 mg SQ weekly and Metformin 500 mg twice daily with meals Will increase water, protein and fiber to help assuage hunger.  Will minimize foods that have a high glucose index/load to minimize reactive hypoglycemia.   4.. Essential hypertension Hypertension Hypertension well controlled.  Medication(s): Chlorthalidone 25 mg  BP Readings from Last 3 Encounters:  04/27/23 110/76  03/19/23 121/78  02/09/23 114/76   Lab Results  Component Value Date   CREATININE 0.95 01/13/2023   CREATININE 0.92 01/08/2023   CREATININE 1.02 (H) 12/26/2021   No results found for: "GFR"  Plan: Continue all antihypertensives at current dosages. No added salt. Will keep sodium content to 1,500 mg or less per day.  5. Other depression, emotional eating  Jacqueline Good has had issues with stress eating, emotional eating, and nighttime eating. Currently this is well controlled. Overall mood is stable. Denies suicidal/homicidal ideation. Medication(s): Wellbutrin (her psychiatrist increased her medication and changed to XL).  Plan:  Specifically regarding  patient's less desirable eating habits and patterns, we employed the technique of small changes when she cannot fully commit to her prudent nutritional plan. iscussed distractions to curb eating behaviors. Discussed activities to do with one's hands in the evening  Be sure to get adequate rest as lack of rest can trigger appetite.  Have plan in place for stressful events.  Consider other rewards besides food.      Generalized Obesity: Current BMI BMI (Calculated): 29.34   Pharmacotherapy Plan Continue and refill  Trulicity 4.5 mg SQ weekly  Jacqueline Good is currently in the action stage of change. As such, her goal is to continue with weight loss efforts.  She has agreed to the Category 1 plan.  Exercise goals: All adults should avoid inactivity. Some physical activity is better than none, and adults who participate in any amount of physical activity gain some health benefits.  Behavioral modification strategies: increasing lean protein intake, no meal skipping, meal planning , increase water intake, better snacking choices, planning for success, increasing vegetables, increasing fiber rich foods, ways to avoid boredom eating, and ways to avoid night time snacking.  Jacqueline Good has agreed to follow-up with our clinic in 4 weeks.      Objective:   VITALS: Per patient if applicable, see vitals. GENERAL: Alert and in no acute distress. CARDIOPULMONARY: No increased WOB. Speaking in clear sentences.  PSYCH: Pleasant and cooperative. Speech normal rate and rhythm. Affect is appropriate. Insight and judgement are appropriate. Attention is focused, linear, and appropriate.  NEURO: Oriented as arrived to appointment on time with no prompting.   Attestation Statements:   This was prepared with the assistance of Engineer, civil (consulting).  Occasional wrong-word or sound-a-like substitutions may have occurred due to the inherent limitations of voice recognition software.   [name], DO

## 2023-04-29 ENCOUNTER — Other Ambulatory Visit (HOSPITAL_COMMUNITY): Payer: Self-pay

## 2023-04-29 MED ORDER — OXYCODONE-ACETAMINOPHEN 10-325 MG PO TABS
1.0000 | ORAL_TABLET | Freq: Four times a day (QID) | ORAL | 0 refills | Status: AC | PRN
Start: 2023-04-29 — End: ?
  Filled 2023-04-29: qty 120, 30d supply, fill #0

## 2023-05-13 ENCOUNTER — Other Ambulatory Visit (HOSPITAL_COMMUNITY): Payer: Self-pay

## 2023-05-14 DIAGNOSIS — Z853 Personal history of malignant neoplasm of breast: Secondary | ICD-10-CM | POA: Diagnosis not present

## 2023-05-14 DIAGNOSIS — E7800 Pure hypercholesterolemia, unspecified: Secondary | ICD-10-CM | POA: Diagnosis not present

## 2023-05-14 DIAGNOSIS — Z87891 Personal history of nicotine dependence: Secondary | ICD-10-CM | POA: Diagnosis not present

## 2023-05-14 DIAGNOSIS — I890 Lymphedema, not elsewhere classified: Secondary | ICD-10-CM | POA: Diagnosis not present

## 2023-05-14 DIAGNOSIS — Z9181 History of falling: Secondary | ICD-10-CM | POA: Diagnosis not present

## 2023-05-14 DIAGNOSIS — Z23 Encounter for immunization: Secondary | ICD-10-CM | POA: Diagnosis not present

## 2023-05-14 DIAGNOSIS — I10 Essential (primary) hypertension: Secondary | ICD-10-CM | POA: Diagnosis not present

## 2023-05-14 DIAGNOSIS — J432 Centrilobular emphysema: Secondary | ICD-10-CM | POA: Diagnosis not present

## 2023-05-14 DIAGNOSIS — Z0000 Encounter for general adult medical examination without abnormal findings: Secondary | ICD-10-CM | POA: Diagnosis not present

## 2023-05-14 DIAGNOSIS — K219 Gastro-esophageal reflux disease without esophagitis: Secondary | ICD-10-CM | POA: Diagnosis not present

## 2023-05-14 DIAGNOSIS — K224 Dyskinesia of esophagus: Secondary | ICD-10-CM | POA: Diagnosis not present

## 2023-05-14 DIAGNOSIS — G8929 Other chronic pain: Secondary | ICD-10-CM | POA: Diagnosis not present

## 2023-05-14 DIAGNOSIS — J4520 Mild intermittent asthma, uncomplicated: Secondary | ICD-10-CM | POA: Diagnosis not present

## 2023-05-15 ENCOUNTER — Other Ambulatory Visit: Payer: Self-pay | Admitting: Physician Assistant

## 2023-05-15 DIAGNOSIS — Z87891 Personal history of nicotine dependence: Secondary | ICD-10-CM

## 2023-05-15 DIAGNOSIS — J432 Centrilobular emphysema: Secondary | ICD-10-CM

## 2023-05-22 ENCOUNTER — Other Ambulatory Visit (HOSPITAL_COMMUNITY): Payer: Self-pay

## 2023-05-22 MED ORDER — SERTRALINE HCL 50 MG PO TABS
ORAL_TABLET | ORAL | 0 refills | Status: AC
  Filled 2023-05-22: qty 45, 30d supply, fill #0

## 2023-05-23 ENCOUNTER — Other Ambulatory Visit (HOSPITAL_COMMUNITY): Payer: Self-pay

## 2023-05-26 ENCOUNTER — Other Ambulatory Visit (HOSPITAL_COMMUNITY): Payer: Self-pay

## 2023-05-26 ENCOUNTER — Encounter: Payer: Self-pay | Admitting: Bariatrics

## 2023-05-26 ENCOUNTER — Ambulatory Visit (INDEPENDENT_AMBULATORY_CARE_PROVIDER_SITE_OTHER): Payer: 59 | Admitting: Bariatrics

## 2023-05-26 VITALS — BP 114/74 | HR 72 | Temp 98.0°F | Ht 64.0 in | Wt 165.0 lb

## 2023-05-26 DIAGNOSIS — R632 Polyphagia: Secondary | ICD-10-CM | POA: Diagnosis not present

## 2023-05-26 DIAGNOSIS — F3289 Other specified depressive episodes: Secondary | ICD-10-CM

## 2023-05-26 DIAGNOSIS — I10 Essential (primary) hypertension: Secondary | ICD-10-CM

## 2023-05-26 DIAGNOSIS — E88819 Insulin resistance, unspecified: Secondary | ICD-10-CM | POA: Diagnosis not present

## 2023-05-26 DIAGNOSIS — E559 Vitamin D deficiency, unspecified: Secondary | ICD-10-CM

## 2023-05-26 DIAGNOSIS — E66811 Obesity, class 1: Secondary | ICD-10-CM | POA: Insufficient documentation

## 2023-05-26 DIAGNOSIS — Z6828 Body mass index (BMI) 28.0-28.9, adult: Secondary | ICD-10-CM

## 2023-05-26 MED ORDER — METFORMIN HCL 500 MG PO TABS
500.0000 mg | ORAL_TABLET | Freq: Two times a day (BID) | ORAL | 0 refills | Status: DC
Start: 2023-05-26 — End: 2023-06-23
  Filled 2023-05-26: qty 60, 30d supply, fill #0

## 2023-05-26 MED ORDER — TRULICITY 4.5 MG/0.5ML ~~LOC~~ SOAJ
4.5000 mg | SUBCUTANEOUS | 0 refills | Status: DC
  Filled 2023-05-26: qty 2, 28d supply, fill #0

## 2023-05-26 MED ORDER — CHLORTHALIDONE 25 MG PO TABS
25.0000 mg | ORAL_TABLET | Freq: Every day | ORAL | 0 refills | Status: DC
Start: 2023-05-26 — End: 2023-06-23
  Filled 2023-05-26: qty 30, 30d supply, fill #0

## 2023-05-26 MED ORDER — BUPROPION HCL ER (SR) 150 MG PO TB12
150.0000 mg | ORAL_TABLET | Freq: Every day | ORAL | 0 refills | Status: DC
  Filled 2023-05-26: qty 30, 30d supply, fill #0

## 2023-05-26 MED ORDER — VITAMIN D (ERGOCALCIFEROL) 1.25 MG (50000 UNIT) PO CAPS
50000.0000 [IU] | ORAL_CAPSULE | ORAL | 0 refills | Status: DC
  Filled 2023-05-26: qty 5, 35d supply, fill #0

## 2023-05-27 ENCOUNTER — Other Ambulatory Visit (HOSPITAL_COMMUNITY): Payer: Self-pay

## 2023-05-27 DIAGNOSIS — M545 Low back pain, unspecified: Secondary | ICD-10-CM | POA: Diagnosis not present

## 2023-05-27 DIAGNOSIS — G8929 Other chronic pain: Secondary | ICD-10-CM | POA: Diagnosis not present

## 2023-05-27 DIAGNOSIS — Z79899 Other long term (current) drug therapy: Secondary | ICD-10-CM | POA: Diagnosis not present

## 2023-05-27 MED ORDER — ALPRAZOLAM 1 MG PO TABS
ORAL_TABLET | ORAL | 0 refills | Status: DC
  Filled 2023-07-20: qty 75, 30d supply, fill #0

## 2023-05-27 MED ORDER — ZOLPIDEM TARTRATE 10 MG PO TABS: ORAL_TABLET | ORAL | 0 refills | Status: AC

## 2023-05-27 MED ORDER — OXYCODONE-ACETAMINOPHEN 10-325 MG PO TABS
1.0000 | ORAL_TABLET | Freq: Four times a day (QID) | ORAL | 0 refills | Status: AC | PRN
  Filled 2023-05-27: qty 120, 30d supply, fill #0

## 2023-05-27 NOTE — Progress Notes (Signed)
Chief Complaint:   OBESITY Jacqueline Good is here to discuss her progress with her obesity treatment plan along with follow-up of her obesity related diagnoses. [name] is on the Category 1 Plan and states she is following her eating plan approximately 80% of the time. [name] states she is doing chair exercise for 5 minutes 5 times per week.  Today's visit was #: 27 Starting weight: 238 lbs Starting date: 03/21/2021 Today's weight: 165 lbs Today's date: 05/26/2023 Total lbs lost to date: 73 Total lbs lost since last in-office visit: 6  Interim History: Patient is down 6 pounds.  She is getting to the maintenance phase of her weight loss.  She is getting adequate water and protein in.  Subjective:   1. Vitamin D deficiency Patient is taking vitamin D as directed, and she notes her energy has improved.  2. Insulin resistance Patient denies side effects of metformin.  3. Polyphagia Patient is taking Trulicity, and she notes normal appetite.  4. Essential hypertension Patient is taking chlorthalidone, and her blood pressure is well-controlled.  5. Other depression, emotional eating Patient is taking Wellbutrin as directed and she notes decreased cravings.  Assessment/Plan:   1. Vitamin D deficiency We will refill prescription vitamin D once weekly for 1 month.  - Vitamin D, Ergocalciferol, (DRISDOL) 1.25 MG (50000 UNIT) CAPS capsule; Take 1 capsule by mouth every 7 days.  Dispense: 5 capsule; Refill: 0  2. Insulin resistance We will refill metformin 500 mg twice daily for 1 month.  - metFORMIN (GLUCOPHAGE) 500 MG tablet; Take 1 tablet (500 mg) by mouth 2 times daily with meals.  Dispense: 60 tablet; Refill: 0  3. Polyphagia We will refill Trulicity 4.5 mg subcu weekly for 1 month; continue metformin.   - metFORMIN (GLUCOPHAGE) 500 MG tablet; Take 1 tablet (500 mg) by mouth 2 times daily with meals.  Dispense: 60 tablet; Refill: 0 - Dulaglutide (TRULICITY) 4.5 MG/0.5ML  SOPN; Inject 4.5 mg subcutaneously once a week as directed.  Dispense: 2 mL; Refill: 0  4. Essential hypertension We will refill chlorthalidone 25 mg once daily for 1 month.  - chlorthalidone (HYGROTON) 25 MG tablet; Take 1 tablet (25 mg) by mouth daily.  Dispense: 30 tablet; Refill: 0  5. Other depression, emotional eating We will refill Wellbutrin SR 150 mg once daily for 1 month.  - buPROPion (WELLBUTRIN SR) 150 MG 12 hr tablet; Take 1 tablet by mouth daily.  Dispense: 30 tablet; Refill: 0  6. Obesity, Class I, BMI 30-34.9 Jacqueline Good is currently in the action stage of change. As such, her goal is to continue with weight loss efforts. She has agreed to the Category 1 Plan.   Meal planning was discussed.  Patient is to add some more protein and watch her portions.  She will weigh herself at home weekly and set a goal (critical weight) no greater than 165 pounds.  Exercise goals: As is.   Behavioral modification strategies: increasing lean protein intake, decreasing simple carbohydrates, increasing vegetables, increasing water intake, decreasing eating out, no skipping meals, meal planning and cooking strategies, keeping healthy foods in the home, and planning for success.  Donnabelle has agreed to follow-up with our clinic in 4 weeks. She was informed of the importance of frequent follow-up visits to maximize her success with intensive lifestyle modifications for her multiple health conditions.   Objective:   Blood pressure 114/74, pulse 72, temperature 98 F (36.7 C), height 5\' 4"  (1.626 m), weight 165 lb (74.8 kg),  SpO2 95%. Body mass index is 28.32 kg/m.  General: Cooperative, alert, well developed, in no acute distress. HEENT: Conjunctivae and lids unremarkable. Cardiovascular: Regular rhythm.  Lungs: Normal work of breathing. Neurologic: No focal deficits.   Lab Results  Component Value Date   CREATININE 0.95 01/13/2023   BUN 19 01/13/2023   NA 142 01/13/2023   K 4.1  01/13/2023   CL 108 01/13/2023   CO2 27 01/13/2023   Lab Results  Component Value Date   ALT 20 01/13/2023   AST 21 01/13/2023   ALKPHOS 76 01/13/2023   BILITOT 0.5 01/13/2023   Lab Results  Component Value Date   HGBA1C 5.3 01/08/2023   HGBA1C 5.1 11/27/2021   HGBA1C 5.5 03/21/2021   HGBA1C 5.4 04/27/2014   Lab Results  Component Value Date   INSULIN 9.2 01/08/2023   INSULIN 11.7 11/27/2021   INSULIN 31.4 (H) 03/21/2021   Lab Results  Component Value Date   TSH 0.641 01/08/2023   Lab Results  Component Value Date   CHOL 170 01/08/2023   HDL 48 01/08/2023   LDLCALC 102 (H) 01/08/2023   TRIG 109 01/08/2023   CHOLHDL 3.2 02/18/2019   Lab Results  Component Value Date   VD25OH 58.3 01/08/2023   VD25OH 33.2 11/27/2021   VD25OH 14.4 (L) 03/21/2021   Lab Results  Component Value Date   WBC 6.9 01/13/2023   HGB 12.7 01/13/2023   HCT 37.9 01/13/2023   MCV 85.4 01/13/2023   PLT 218 01/13/2023   No results found for: "IRON", "TIBC", "FERRITIN"  Attestation Statements:   Reviewed by clinician on day of visit: allergies, medications, problem list, medical history, surgical history, family history, social history, and previous encounter notes.   Trude Mcburney, am acting as Energy manager for [employer], DO.  I have reviewed the above documentation for accuracy and completeness, and I agree with the above. [name], DO

## 2023-05-28 ENCOUNTER — Other Ambulatory Visit (HOSPITAL_COMMUNITY): Payer: Self-pay

## 2023-05-29 ENCOUNTER — Other Ambulatory Visit (HOSPITAL_COMMUNITY): Payer: Self-pay

## 2023-06-12 ENCOUNTER — Other Ambulatory Visit (HOSPITAL_COMMUNITY): Payer: Self-pay

## 2023-06-19 ENCOUNTER — Other Ambulatory Visit (HOSPITAL_COMMUNITY): Payer: Self-pay

## 2023-06-19 MED ORDER — ALPRAZOLAM 1 MG PO TABS
ORAL_TABLET | ORAL | 0 refills | Status: AC
  Filled 2023-06-19: qty 75, 30d supply, fill #0

## 2023-06-19 MED ORDER — SERTRALINE HCL 100 MG PO TABS
100.0000 mg | ORAL_TABLET | Freq: Every morning | ORAL | 1 refills | Status: DC
  Filled 2023-06-19: qty 30, 30d supply, fill #0
  Filled 2023-07-20: qty 30, 30d supply, fill #1

## 2023-06-19 MED ORDER — BUPROPION HCL ER (XL) 300 MG PO TB24
300.0000 mg | ORAL_TABLET | Freq: Every morning | ORAL | 1 refills | Status: DC
  Filled 2023-06-19: qty 30, 30d supply, fill #0
  Filled 2023-07-20: qty 30, 30d supply, fill #1

## 2023-06-19 MED ORDER — ZOLPIDEM TARTRATE 10 MG PO TABS
10.0000 mg | ORAL_TABLET | Freq: Every day | ORAL | 0 refills | Status: DC
  Filled 2023-06-19: qty 30, 30d supply, fill #0

## 2023-06-19 MED ORDER — LURASIDONE HCL 40 MG PO TABS
40.0000 mg | ORAL_TABLET | Freq: Every evening | ORAL | 1 refills | Status: AC
  Filled 2023-06-19: qty 30, 30d supply, fill #0
  Filled 2023-07-20: qty 30, 30d supply, fill #1

## 2023-06-22 ENCOUNTER — Other Ambulatory Visit (HOSPITAL_COMMUNITY): Payer: Self-pay

## 2023-06-23 ENCOUNTER — Encounter: Payer: Self-pay | Admitting: Bariatrics

## 2023-06-23 ENCOUNTER — Other Ambulatory Visit (HOSPITAL_COMMUNITY): Payer: Self-pay

## 2023-06-23 ENCOUNTER — Ambulatory Visit (INDEPENDENT_AMBULATORY_CARE_PROVIDER_SITE_OTHER): Payer: 59 | Admitting: Bariatrics

## 2023-06-23 DIAGNOSIS — E88819 Insulin resistance, unspecified: Secondary | ICD-10-CM | POA: Diagnosis not present

## 2023-06-23 DIAGNOSIS — E559 Vitamin D deficiency, unspecified: Secondary | ICD-10-CM | POA: Diagnosis not present

## 2023-06-23 DIAGNOSIS — R632 Polyphagia: Secondary | ICD-10-CM

## 2023-06-23 DIAGNOSIS — I10 Essential (primary) hypertension: Secondary | ICD-10-CM | POA: Diagnosis not present

## 2023-06-23 DIAGNOSIS — Z6827 Body mass index (BMI) 27.0-27.9, adult: Secondary | ICD-10-CM

## 2023-06-23 DIAGNOSIS — E669 Obesity, unspecified: Secondary | ICD-10-CM

## 2023-06-23 DIAGNOSIS — F3289 Other specified depressive episodes: Secondary | ICD-10-CM

## 2023-06-23 MED ORDER — METFORMIN HCL 500 MG PO TABS
500.0000 mg | ORAL_TABLET | Freq: Two times a day (BID) | ORAL | 0 refills | Status: DC
  Filled 2023-06-23: qty 60, 30d supply, fill #0

## 2023-06-23 MED ORDER — CHLORTHALIDONE 25 MG PO TABS
25.0000 mg | ORAL_TABLET | Freq: Every day | ORAL | 0 refills | Status: DC
  Filled 2023-06-23: qty 30, 30d supply, fill #0

## 2023-06-23 MED ORDER — TRULICITY 4.5 MG/0.5ML ~~LOC~~ SOAJ
4.5000 mg | SUBCUTANEOUS | 0 refills | Status: DC
  Filled 2023-06-23: qty 2, 28d supply, fill #0

## 2023-06-23 MED ORDER — VITAMIN D (ERGOCALCIFEROL) 1.25 MG (50000 UNIT) PO CAPS
50000.0000 [IU] | ORAL_CAPSULE | ORAL | 0 refills | Status: DC
  Filled 2023-06-23: qty 6, 84d supply, fill #0

## 2023-06-23 NOTE — Progress Notes (Signed)
WEIGHT SUMMARY AND BIOMETRICS  Weight Lost Since Last Visit: 2lb  Weight Gained Since Last Visit: 0   Vitals Temp: 97.9 F (36.6 C) BP: 129/84 Pulse Rate: 65 SpO2: 98 %   Anthropometric Measurements Height: 5\' 4"  (1.626 m) Weight: 163 lb (73.9 kg) BMI (Calculated): 27.97 Weight at Last Visit: 165lb Weight Lost Since Last Visit: 2lb Weight Gained Since Last Visit: 0 Starting Weight: 238lb Total Weight Loss (lbs): 75 lb (34 kg)   Body Composition  Body Fat %: 43.2 % Fat Mass (lbs): 70.4 lbs Muscle Mass (lbs): 87.8 lbs Total Body Water (lbs): 64.4 lbs Visceral Fat Rating : 11   Other Clinical Data Fasting: no Labs: no Today's Visit #: 39 Starting Date: 03/21/21    OBESITY Jacqueline Good is here to discuss her progress with her obesity treatment plan along with follow-up of her obesity related diagnoses.    Nutrition Plan: the Category 1 plan - 60% adherence.  Current exercise:  chair exercises.  Interim History:  She is down another 2 lbs since her last visit. She is currently at her goal.  Eating all of the food on the plan., Is not skipping meals, Meeting protein goals., and Water intake is adequate.   Pharmacotherapy: Jacqueline Good is on Trulicity 4.5 mg SQ weekly and Metformin 500 mg twice daily with meals Adverse side effects: None Hunger is moderately controlled.  Cravings are moderately controlled.  Assessment/Plan:   1. Vitamin D deficiency Vitamin D Deficiency Vitamin D is at goal of 50.  Most recent vitamin D level was 58.3. She is on  prescription ergocalciferol 50,000 IU weekly. Lab Results  Component Value Date   VD25OH 58.3 01/08/2023   VD25OH 33.2 11/27/2021   VD25OH 14.4 (L) 03/21/2021    Plan: Refill prescription vitamin D 50,000 IU weekly.   2. Insulin resistance Insulin Resistance Jacqueline Good has had elevated fasting insulin  readings. Goal is HgbA1c < 5.7, fasting insulin at l0 or less, and preferably at 5.  She  denies polyphagia. Medication(s): Trulicity and Metformin Lab Results  Component Value Date   HGBA1C 5.3 01/08/2023   Lab Results  Component Value Date   INSULIN 9.2 01/08/2023   INSULIN 11.7 11/27/2021   INSULIN 31.4 (H) 03/21/2021    Plan Medication(s): Trulicity 4.5 mg SQ weekly and Metformin 500 mg twice daily with meals Will work on the agreed upon plan. Will minimize refined carbohydrates ( sweets and starches), and focus more on complex carbohydrates.  Increase the micronutrients found in leafy greens, which include magnesium, polyphenols, and vitamin C which have been postulated to help with insulin sensitivity. Minimize "fast food" and cook more meals at home.  Increase fiber to 25 to 30 grams daily.  Information sheet on " Insulin Resistance and Prediabetes".    3. Polyphagia Polyphagia Jacqueline Good endorses excessive hunger.  Medication(s): Metformin and Trulicity  Effects of medication:  moderately controlled. Cravings are moderately controlled.   Plan: Medication(s): Trulicity 4.5 mg SQ weekly and Metformin 500 mg twice daily with meals Will increase water, protein and fiber to help assuage hunger.  Will minimize foods that have a high glucose index/load to minimize reactive hypoglycemia.   4. Essential hypertension Hypertension Hypertension stable.  Medication(s): Chlorthalidone 25 mg  BP Readings from Last 3 Encounters:  06/23/23 129/84  05/26/23 114/74  04/27/23 110/76   Lab Results  Component Value Date   CREATININE 0.95 01/13/2023   CREATININE 0.92 01/08/2023   CREATININE 1.02 (H) 12/26/2021   No results found for: "GFR"  Plan: Continue all antihypertensives at current dosages. Rx: Chlorthalidone 25 mg 1 daily, # 30 with no refills.  No added salt. Will keep sodium content to 1,500 mg or less per day.    5. Other depression, emotional eating Eating  disorder/emotional eating Jacqueline Good has had issues with stress eating, emotional eating, nighttime eating, and boredom eating. Currently this is well controlled. Overall mood is stable. Denies suicidal/homicidal ideation. Medication(s): Wellbutrin 150 mg daily in the am She also takes Topamax per another physician.   Plan:  Motivational interviewing as well as evidence-based interventions for health behavior change were utilized today including the discussion of self monitoring techniques, problem-solving barriers and SMART goal setting techniques.   Discussed distractions to curb eating behaviors. Be sure to get adequate rest as lack of rest can trigger appetite.  Have plan in place for stressful events.  Consider other rewards besides food.     Generalized Obesity: Current BMI BMI (Calculated): 27.97   Pharmacotherapy Plan Continue and refill  Trulicity 4.5 mg SQ weekly and Metformin 500 mg twice daily with meals  Jacqueline Good is currently in the action stage of change. As such, her goal is to maintain weight for now.  She has agreed to the Category 1 plan.  Exercise goals: All adults should avoid inactivity. Some physical activity is better than none, and adults who participate in any amount of physical activity gain some health benefits.  Behavioral modification strategies: increasing lean protein intake, no meal skipping, meal planning , increase water intake, better snacking choices, get rid of junk food in the home, and mindful eating.  Jacqueline Good has agreed to follow-up with our clinic in 4 weeks.     Objective:   VITALS: Per patient if applicable, see vitals. GENERAL: Alert and in no acute distress. CARDIOPULMONARY: No increased WOB. Speaking in clear sentences.  PSYCH: Pleasant and cooperative. Speech normal rate and rhythm. Affect is appropriate. Insight and judgement are appropriate. Attention is focused, linear, and appropriate.  NEURO: Oriented as arrived to appointment on time  with no prompting.   Attestation Statements:   This was prepared with the assistance of Engineer, civil (consulting).  Occasional wrong-word or sound-a-like substitutions may have occurred due to the inherent limitations of voice recognition    [name], DO

## 2023-06-29 ENCOUNTER — Other Ambulatory Visit (HOSPITAL_COMMUNITY): Payer: Self-pay

## 2023-06-29 DIAGNOSIS — G8929 Other chronic pain: Secondary | ICD-10-CM | POA: Diagnosis not present

## 2023-06-29 DIAGNOSIS — Z79899 Other long term (current) drug therapy: Secondary | ICD-10-CM | POA: Diagnosis not present

## 2023-06-29 DIAGNOSIS — M545 Low back pain, unspecified: Secondary | ICD-10-CM | POA: Diagnosis not present

## 2023-06-29 MED ORDER — OXYCODONE-ACETAMINOPHEN 10-325 MG PO TABS
1.0000 | ORAL_TABLET | Freq: Four times a day (QID) | ORAL | 0 refills | Status: DC | PRN
  Filled 2023-06-29: qty 120, 30d supply, fill #0

## 2023-07-20 ENCOUNTER — Other Ambulatory Visit (HOSPITAL_COMMUNITY): Payer: Self-pay

## 2023-07-20 ENCOUNTER — Other Ambulatory Visit: Payer: Self-pay

## 2023-07-21 ENCOUNTER — Ambulatory Visit: Payer: 59 | Admitting: Bariatrics

## 2023-07-21 ENCOUNTER — Other Ambulatory Visit (HOSPITAL_COMMUNITY): Payer: Self-pay

## 2023-07-21 MED ORDER — ZOLPIDEM TARTRATE 10 MG PO TABS
10.0000 mg | ORAL_TABLET | Freq: Every day | ORAL | 0 refills | Status: DC
  Filled 2023-07-21: qty 30, 30d supply, fill #0

## 2023-07-23 ENCOUNTER — Telehealth: Payer: Self-pay | Admitting: Adult Health

## 2023-07-24 ENCOUNTER — Other Ambulatory Visit (HOSPITAL_COMMUNITY): Payer: Self-pay

## 2023-07-27 ENCOUNTER — Other Ambulatory Visit (HOSPITAL_COMMUNITY): Payer: Self-pay

## 2023-07-27 DIAGNOSIS — G8929 Other chronic pain: Secondary | ICD-10-CM | POA: Diagnosis not present

## 2023-07-27 DIAGNOSIS — Z79899 Other long term (current) drug therapy: Secondary | ICD-10-CM | POA: Diagnosis not present

## 2023-07-27 DIAGNOSIS — M545 Low back pain, unspecified: Secondary | ICD-10-CM | POA: Diagnosis not present

## 2023-07-27 MED ORDER — OXYCODONE-ACETAMINOPHEN 10-325 MG PO TABS
1.0000 | ORAL_TABLET | Freq: Four times a day (QID) | ORAL | 0 refills | Status: AC | PRN
  Filled 2023-07-27: qty 120, 30d supply, fill #0

## 2023-07-29 DIAGNOSIS — Z79899 Other long term (current) drug therapy: Secondary | ICD-10-CM | POA: Diagnosis not present

## 2023-07-30 ENCOUNTER — Ambulatory Visit: Payer: 59 | Admitting: Bariatrics

## 2023-07-30 ENCOUNTER — Other Ambulatory Visit (HOSPITAL_COMMUNITY): Payer: Self-pay

## 2023-07-30 ENCOUNTER — Encounter: Payer: Self-pay | Admitting: Bariatrics

## 2023-07-30 DIAGNOSIS — E88819 Insulin resistance, unspecified: Secondary | ICD-10-CM

## 2023-07-30 DIAGNOSIS — Z6827 Body mass index (BMI) 27.0-27.9, adult: Secondary | ICD-10-CM

## 2023-07-30 DIAGNOSIS — R632 Polyphagia: Secondary | ICD-10-CM

## 2023-07-30 DIAGNOSIS — F3289 Other specified depressive episodes: Secondary | ICD-10-CM

## 2023-07-30 DIAGNOSIS — I10 Essential (primary) hypertension: Secondary | ICD-10-CM | POA: Diagnosis not present

## 2023-07-30 DIAGNOSIS — E669 Obesity, unspecified: Secondary | ICD-10-CM

## 2023-07-30 MED ORDER — VITAMIN D (ERGOCALCIFEROL) 1.25 MG (50000 UNIT) PO CAPS
50000.0000 [IU] | ORAL_CAPSULE | ORAL | 0 refills | Status: DC
  Filled 2023-07-30: qty 6, 84d supply, fill #0

## 2023-07-30 MED ORDER — METFORMIN HCL 500 MG PO TABS
500.0000 mg | ORAL_TABLET | Freq: Two times a day (BID) | ORAL | 0 refills | Status: DC
  Filled 2023-07-30: qty 60, 30d supply, fill #0

## 2023-07-30 MED ORDER — BUPROPION HCL ER (SR) 150 MG PO TB12
150.0000 mg | ORAL_TABLET | Freq: Every day | ORAL | 0 refills | Status: DC
  Filled 2023-07-30: qty 30, 30d supply, fill #0

## 2023-07-30 MED ORDER — TRULICITY 4.5 MG/0.5ML ~~LOC~~ SOAJ
4.5000 mg | SUBCUTANEOUS | 0 refills | Status: DC
  Filled 2023-07-30: qty 2, 28d supply, fill #0

## 2023-07-30 NOTE — Progress Notes (Signed)
WEIGHT SUMMARY AND BIOMETRICS  Weight Lost Since Last Visit: 2lb  Weight Gained Since Last Visit: 0   Vitals Temp: 98.3 F (36.8 C) BP: 122/82 Pulse Rate: 71 SpO2: 98 %   Anthropometric Measurements Height: 5\' 4"  (1.626 m) Weight: 161 lb (73 kg) BMI (Calculated): 27.62 Weight at Last Visit: 163lb Weight Lost Since Last Visit: 2lb Weight Gained Since Last Visit: 0 Starting Weight: 238lb Total Weight Loss (lbs): 77 lb (34.9 kg)   Body Composition  Body Fat %: 42.3 % Fat Mass (lbs): 68.4 lbs Muscle Mass (lbs): 88.6 lbs Total Body Water (lbs): 64.8 lbs Visceral Fat Rating : 10   Other Clinical Data Fasting: no Labs: no Today's Visit #: 30 Starting Date: 03/21/21    OBESITY Nickisha is here to discuss her progress with her obesity treatment plan along with follow-up of her obesity related diagnoses.    Nutrition Plan: the Category 1 plan - 50% adherence.  Current exercise: none  Interim History:  Eating all of the food on the plan., Is skipping meals, and Water intake is adequate.  She is down an additional 2 lbs since her last visit.   Pharmacotherapy: Myracle is on Trulicity 4.5 mg SQ weekly Adverse side effects: None Hunger is moderately controlled.  Cravings are moderately controlled.  Assessment/Plan:   1. Insulin resistance Insulin Resistance Oliana has had elevated fasting insulin readings. Goal is HgbA1c < 5.7, fasting insulin at l0 or less, and preferably at 5.  She denies polyphagia. Medication(s): Trulicity  Lab Results  Component Value Date   HGBA1C 5.3 01/08/2023   Lab Results  Component Value Date   INSULIN 9.2 01/08/2023   INSULIN 11.7 11/27/2021   INSULIN 31.4 (H) 03/21/2021    Plan Medication(s): Trulicity 4.5 mg SQ weekly and Metformin 500 mg twice daily with meals Will work on the agreed upon plan. Will  minimize refined carbohydrates ( sweets and starches), and focus more on complex carbohydrates.  Increase the micronutrients found in leafy greens, which include magnesium, polyphenols, and vitamin C which have been postulated to help with insulin sensitivity. Minimize "fast food" and cook more meals at home.  Increase fiber to 25 to 30 grams daily.   2. Polyphagia Polyphagia Velvie endorses excessive hunger.  Medication(s): Metformin and Trulicity. Effects of medication:  moderately controlled. Cravings are moderately controlled.   Plan: Medication(s): Trulicity 4.5 mg SQ weekly Will increase water, protein and fiber to help assuage hunger.  Will minimize foods that have a high glucose index/load to minimize reactive hypoglycemia.    3. Essential hypertension Hypertension Hypertension well controlled.  Medication(s): Chlorthalidone 25 mg  BP Readings from Last 3 Encounters:  07/30/23 122/82  06/23/23 129/84  05/26/23 114/74   Lab Results  Component Value Date   CREATININE 0.95 01/13/2023   CREATININE 0.92 01/08/2023   CREATININE 1.02 (H) 12/26/2021  Plan: She would like to stop her chlorthalidone at this time.  She states that she will check her blood pressures at home and will call us if her blood pressures are over 140/90. She will bring her blood pressure readings to her next visit No added salt. Will keep sodium content to 1,500 mg or less per day.    4. Other depression, emotional eating Eating disorder/emotional eating Milda has had issues with stress eating, emotional eating, and nighttime eating. Currently this is well controlled. Overall mood is stable. Denies suicidal/homicidal ideation. Medication(s): Wellbutrin 150 mg daily in the am  Plan:  Motivational interviewing as well as evidence-based interventions for health behavior change were utilized today including the discussion of self monitoring techniques, problem-solving barriers and SMART goal  setting techniques.  Discussed distractions to curb eating behaviors. Discussed activities to do with one's hands in the evening  Be sure to get adequate rest as lack of rest can trigger appetite.  Have plan in place for stressful events.  Consider other rewards besides food.       Generalized Obesity: Current BMI BMI (Calculated): 27.62   Pharmacotherapy Plan Continue and refill  Trulicity 4.5 mg SQ weekly and Metformin 500 mg twice daily with meals  Jameyah is currently in the action stage of change. As such, her goal is to continue with weight loss efforts.  She has agreed to the Category 1 plan.  Exercise goals: All adults should avoid inactivity. Some physical activity is better than none, and adults who participate in any amount of physical activity gain some health benefits. She will add in some weight training.  Behavioral modification strategies: increasing lean protein intake, decreasing simple carbohydrates , no meal skipping, meal planning , increasing vegetables, decrease snacking , and avoiding temptations.  Dezera has agreed to follow-up with our clinic in 4 weeks.    Objective:   VITALS: Per patient if applicable, see vitals. GENERAL: Alert and in no acute distress. CARDIOPULMONARY: No increased WOB. Speaking in clear sentences.  PSYCH: Pleasant and cooperative. Speech normal rate and rhythm. Affect is appropriate. Insight and judgement are appropriate. Attention is focused, linear, and appropriate.  NEURO: Oriented as arrived to appointment on time with no prompting.   Attestation Statements:    This was prepared with the assistance of Engineer, civil (consulting).  Occasional wrong-word or sound-a-like substitutions may have occurred due to the inherent limitations of voice recognition   [name], DO

## 2023-08-01 ENCOUNTER — Other Ambulatory Visit (HOSPITAL_COMMUNITY): Payer: Self-pay

## 2023-08-18 ENCOUNTER — Other Ambulatory Visit (HOSPITAL_COMMUNITY): Payer: Self-pay

## 2023-08-18 ENCOUNTER — Other Ambulatory Visit (HOSPITAL_BASED_OUTPATIENT_CLINIC_OR_DEPARTMENT_OTHER): Payer: Self-pay

## 2023-08-18 ENCOUNTER — Other Ambulatory Visit: Payer: Self-pay

## 2023-08-18 MED ORDER — ZOLPIDEM TARTRATE 10 MG PO TABS
10.0000 mg | ORAL_TABLET | Freq: Every day | ORAL | 2 refills | Status: AC
  Filled 2023-08-18 (×2): qty 30, 30d supply, fill #0
  Filled 2023-09-18: qty 30, 30d supply, fill #1
  Filled 2023-10-13 – 2023-10-16 (×2): qty 30, 30d supply, fill #2

## 2023-08-18 MED ORDER — LURASIDONE HCL 40 MG PO TABS
40.0000 mg | ORAL_TABLET | Freq: Every day | ORAL | 1 refills | Status: AC
  Filled 2023-08-18 (×2): qty 30, 30d supply, fill #0
  Filled 2023-09-18: qty 30, 30d supply, fill #1

## 2023-08-18 MED ORDER — ALPRAZOLAM 1 MG PO TABS
ORAL_TABLET | ORAL | 2 refills | Status: DC
  Filled 2023-08-18 (×2): qty 75, 30d supply, fill #0
  Filled 2023-09-18: qty 75, 30d supply, fill #1
  Filled 2023-10-13 – 2023-10-16 (×2): qty 75, 30d supply, fill #2

## 2023-08-18 MED ORDER — BUPROPION HCL ER (XL) 300 MG PO TB24
300.0000 mg | ORAL_TABLET | Freq: Every morning | ORAL | 1 refills | Status: DC
  Filled 2023-08-18 (×2): qty 30, 30d supply, fill #0
  Filled 2023-09-18: qty 30, 30d supply, fill #1

## 2023-08-18 MED ORDER — SERTRALINE HCL 100 MG PO TABS
150.0000 mg | ORAL_TABLET | Freq: Every morning | ORAL | 2 refills | Status: DC
  Filled 2023-08-18 (×2): qty 45, 30d supply, fill #0
  Filled 2023-09-18: qty 45, 30d supply, fill #1
  Filled 2023-10-13 – 2023-10-16 (×2): qty 45, 30d supply, fill #2

## 2023-08-27 ENCOUNTER — Other Ambulatory Visit (HOSPITAL_COMMUNITY): Payer: Self-pay

## 2023-08-27 DIAGNOSIS — G8929 Other chronic pain: Secondary | ICD-10-CM | POA: Diagnosis not present

## 2023-08-27 DIAGNOSIS — Z79899 Other long term (current) drug therapy: Secondary | ICD-10-CM | POA: Diagnosis not present

## 2023-08-27 DIAGNOSIS — M545 Low back pain, unspecified: Secondary | ICD-10-CM | POA: Diagnosis not present

## 2023-08-27 DIAGNOSIS — M542 Cervicalgia: Secondary | ICD-10-CM | POA: Diagnosis not present

## 2023-08-27 DIAGNOSIS — Z0000 Encounter for general adult medical examination without abnormal findings: Secondary | ICD-10-CM | POA: Diagnosis not present

## 2023-08-27 DIAGNOSIS — M546 Pain in thoracic spine: Secondary | ICD-10-CM | POA: Diagnosis not present

## 2023-08-27 MED ORDER — OXYCODONE-ACETAMINOPHEN 10-325 MG PO TABS
1.0000 | ORAL_TABLET | Freq: Four times a day (QID) | ORAL | 0 refills | Status: DC | PRN
  Filled 2023-08-27: qty 60, 15d supply, fill #0

## 2023-08-28 ENCOUNTER — Other Ambulatory Visit (HOSPITAL_COMMUNITY): Payer: Self-pay

## 2023-08-28 ENCOUNTER — Other Ambulatory Visit (HOSPITAL_BASED_OUTPATIENT_CLINIC_OR_DEPARTMENT_OTHER): Payer: Self-pay

## 2023-08-28 DIAGNOSIS — M546 Pain in thoracic spine: Secondary | ICD-10-CM | POA: Diagnosis not present

## 2023-08-28 DIAGNOSIS — M542 Cervicalgia: Secondary | ICD-10-CM | POA: Diagnosis not present

## 2023-08-28 DIAGNOSIS — M545 Low back pain, unspecified: Secondary | ICD-10-CM | POA: Diagnosis not present

## 2023-08-29 ENCOUNTER — Other Ambulatory Visit (HOSPITAL_COMMUNITY): Payer: Self-pay

## 2023-08-29 MED ORDER — OXYCODONE-ACETAMINOPHEN 10-325 MG PO TABS
1.0000 | ORAL_TABLET | Freq: Four times a day (QID) | ORAL | 0 refills | Status: DC | PRN
  Filled 2023-09-10: qty 60, 15d supply, fill #0

## 2023-08-31 ENCOUNTER — Encounter: Payer: Self-pay | Admitting: Bariatrics

## 2023-08-31 ENCOUNTER — Other Ambulatory Visit (HOSPITAL_COMMUNITY): Payer: Self-pay

## 2023-08-31 ENCOUNTER — Ambulatory Visit (INDEPENDENT_AMBULATORY_CARE_PROVIDER_SITE_OTHER): Payer: 59 | Admitting: Bariatrics

## 2023-08-31 VITALS — BP 112/73 | HR 71 | Temp 97.9°F | Ht 64.0 in | Wt 157.0 lb

## 2023-08-31 DIAGNOSIS — E7800 Pure hypercholesterolemia, unspecified: Secondary | ICD-10-CM

## 2023-08-31 DIAGNOSIS — Z6826 Body mass index (BMI) 26.0-26.9, adult: Secondary | ICD-10-CM

## 2023-08-31 DIAGNOSIS — R632 Polyphagia: Secondary | ICD-10-CM

## 2023-08-31 DIAGNOSIS — E88819 Insulin resistance, unspecified: Secondary | ICD-10-CM

## 2023-08-31 DIAGNOSIS — I10 Essential (primary) hypertension: Secondary | ICD-10-CM | POA: Diagnosis not present

## 2023-08-31 DIAGNOSIS — Z79899 Other long term (current) drug therapy: Secondary | ICD-10-CM | POA: Diagnosis not present

## 2023-08-31 DIAGNOSIS — E669 Obesity, unspecified: Secondary | ICD-10-CM

## 2023-08-31 DIAGNOSIS — F3289 Other specified depressive episodes: Secondary | ICD-10-CM

## 2023-08-31 DIAGNOSIS — E559 Vitamin D deficiency, unspecified: Secondary | ICD-10-CM | POA: Diagnosis not present

## 2023-08-31 MED ORDER — BUPROPION HCL ER (SR) 150 MG PO TB12
150.0000 mg | ORAL_TABLET | Freq: Every day | ORAL | 0 refills | Status: DC
  Filled 2023-08-31: qty 30, 30d supply, fill #0

## 2023-08-31 MED ORDER — METFORMIN HCL 500 MG PO TABS
500.0000 mg | ORAL_TABLET | Freq: Two times a day (BID) | ORAL | 0 refills | Status: DC
  Filled 2023-08-31: qty 60, 30d supply, fill #0

## 2023-08-31 MED ORDER — VITAMIN D (ERGOCALCIFEROL) 1.25 MG (50000 UNIT) PO CAPS
50000.0000 [IU] | ORAL_CAPSULE | ORAL | 0 refills | Status: DC
  Filled 2023-08-31: qty 6, 84d supply, fill #0

## 2023-08-31 MED ORDER — TRULICITY 4.5 MG/0.5ML ~~LOC~~ SOAJ
4.5000 mg | SUBCUTANEOUS | 0 refills | Status: DC
  Filled 2023-08-31: qty 2, 28d supply, fill #0

## 2023-08-31 NOTE — Progress Notes (Signed)
WEIGHT SUMMARY AND BIOMETRICS  Weight Lost Since Last Visit: 4lb  Weight Gained Since Last Visit: 0   Vitals Temp: 97.9 F (36.6 C) BP: 112/73 Pulse Rate: 71 SpO2: 99 %   Anthropometric Measurements Height: 5\' 4"  (1.626 m) Weight: 157 lb (71.2 kg) BMI (Calculated): 26.94 Weight at Last Visit: 161lb Weight Lost Since Last Visit: 4lb Weight Gained Since Last Visit: 0 Starting Weight: 238lb Total Weight Loss (lbs): 81 lb (36.7 kg)   Body Composition  Body Fat %: 41.4 % Fat Mass (lbs): 65 lbs Muscle Mass (lbs): 87.4 lbs Total Body Water (lbs): 65.8 lbs Visceral Fat Rating : 10   Other Clinical Data Fasting: yes Labs: yes Today's Visit #: 31 Starting Date: 03/21/21    OBESITY Jacqueline Good is here to discuss her progress with her obesity treatment plan along with follow-up of her obesity related diagnoses.    Nutrition Plan: the Category 1 plan - 60% adherence.  Current exercise: weightlifting  Interim History:  She is down another 4 lbs since her last visit.  Not eating all of the food on the plan., Protein intake is less than prescribed., Is skipping meals, Not journaling consistently., and Denies polyphagia   Pharmacotherapy: Jacqueline Good is on Trulicity 4.5 mg SQ weekly and Metformin 500 mg twice daily with meals Adverse side effects: None Hunger is well controlled.  Cravings are moderately controlled.  Assessment/Plan:   Insulin Resistance Jacqueline Good has had elevated fasting insulin readings. Goal is HgbA1c < 5.7, fasting insulin at l0 or less, and preferably at 5.  She  denies polyphagia. Medication(s):  Metformin and Trulicity Lab Results  Component Value Date   HGBA1C 5.3 01/08/2023   Lab Results  Component Value Date   INSULIN 9.2 01/08/2023   INSULIN 11.7 11/27/2021   INSULIN 31.4 (H) 03/21/2021    Plan Medication(s): Trulicity 4.5 mg  SQ weekly and Metformin 500 mg twice daily with meals Will work on the agreed upon plan. Will minimize refined carbohydrates ( sweets and starches), and focus more on complex carbohydrates.  Increase the micronutrients found in leafy greens, which include magnesium, polyphenols, and vitamin C which have been postulated to help with insulin sensitivity. Minimize "fast food" and cook more meals at home.  Increase fiber to 25 to 30 grams daily.  Information sheet on " Insulin Resistance and Prediabetes".    Polyphagia Jacqueline Good endorses excessive hunger.  Medication(s): Metformin and Trulicity  Effects of medication:  appetite well controlled. Cravings are moderately controlled.   Plan: Medication(s): Trulicity 4.5 mg SQ weekly and Metformin 500 mg twice daily with meals Will increase water, protein and fiber to help assuage hunger.  Will minimize foods that have a high glucose index/load to minimize reactive hypoglycemia.   Eating disorder/emotional eating Jacqueline Good has had issues with emotional eating and boredom eating. Currently this is moderately controlled. Overall mood is stable. Denies  suicidal/homicidal ideation. Medication(s): Wellbutrin 150 mg daily in the am  Plan:  Motivational interviewing as well as evidence-based interventions for health behavior change were utilized today including the discussion of self monitoring techniques, problem-solving barriers and SMART goal setting techniques.  Consider a referral to Dr. Dewaine Conger, PhD psychologist to help with eating behaviors.  Discussed distractions to curb eating behaviors. Discussed activities to do with one's hands in the evening  Be sure to get adequate rest as lack of rest can trigger appetite.  Have plan in place for stressful events.  Consider other rewards besides food.     Hypertension:   Taking medications as directed. Controlled  Plan: Continue medications as directed. No added salt.    Elevated cholesterol:    Taking medications as directed.   Plan: Will check lipids. Continue medications. Continue diet and exercise.    Vitamin D deficiency:   Taking vitamin D prescription  Plan: Continue vitamin D.  Will check vitamin D.  Rx: Vitamin D 50,000 international units once every 2 weeks #6 with no refills.    Labs done today (CMP, Lipids, insulin, vitamin D,).    Generalized Obesity: Current BMI BMI (Calculated): 26.94   Pharmacotherapy Plan Continue and refill  Trulicity 4.5 mg SQ weekly and Metformin 500 mg twice daily with meals  Jacqueline Good is currently in the action stage of change. As such, her goal is to continue with weight loss efforts.  She has agreed to the Category 1 plan.  Exercise goals: For substantial health benefits, adults should do at least 150 minutes (2 hours and 30 minutes) a week of moderate-intensity, or 75 minutes (1 hour and 15 minutes) a week of vigorous-intensity aerobic physical activity, or an equivalent combination of moderate- and vigorous-intensity aerobic activity. Aerobic activity should be performed in episodes of at least 10 minutes, and preferably, it should be spread throughout the week.  Behavioral modification strategies: increasing lean protein intake, no meal skipping, meal planning , increase water intake, better snacking choices, planning for success, increasing vegetables, avoiding temptations, and keep healthy foods in the home.  Jacqueline Good has agreed to follow-up with our clinic in 4 weeks.       Objective:   VITALS: Per patient if applicable, see vitals. GENERAL: Alert and in no acute distress. CARDIOPULMONARY: No increased WOB. Speaking in clear sentences.  PSYCH: Pleasant and cooperative. Speech normal rate and rhythm. Affect is appropriate. Insight and judgement are appropriate. Attention is focused, linear, and appropriate.  NEURO: Oriented as arrived to appointment on time with no prompting.   Attestation Statements:   This was  prepared with the assistance of Engineer, civil (consulting).  Occasional wrong-word or sound-a-like substitutions may have occurred due to the inherent limitations of voice recognition   Jacqueline Capra, DO

## 2023-09-01 ENCOUNTER — Encounter: Payer: Self-pay | Admitting: Bariatrics

## 2023-09-01 DIAGNOSIS — E7800 Pure hypercholesterolemia, unspecified: Secondary | ICD-10-CM | POA: Insufficient documentation

## 2023-09-01 LAB — COMPREHENSIVE METABOLIC PANEL
ALT: 10 [IU]/L (ref 0–32)
AST: 13 [IU]/L (ref 0–40)
Albumin: 4.4 g/dL (ref 3.9–4.9)
Alkaline Phosphatase: 70 [IU]/L (ref 44–121)
BUN/Creatinine Ratio: 17 (ref 12–28)
BUN: 16 mg/dL (ref 8–27)
Bilirubin Total: 0.6 mg/dL (ref 0.0–1.2)
CO2: 21 mmol/L (ref 20–29)
Calcium: 9.5 mg/dL (ref 8.7–10.3)
Chloride: 105 mmol/L (ref 96–106)
Creatinine, Ser: 0.93 mg/dL (ref 0.57–1.00)
Globulin, Total: 2.3 g/dL (ref 1.5–4.5)
Glucose: 83 mg/dL (ref 70–99)
Potassium: 3.8 mmol/L (ref 3.5–5.2)
Sodium: 141 mmol/L (ref 134–144)
Total Protein: 6.7 g/dL (ref 6.0–8.5)
eGFR: 69 mL/min/{1.73_m2} (ref >59)

## 2023-09-01 LAB — LIPID PANEL WITH LDL/HDL RATIO
Cholesterol, Total: 242 mg/dL — ABNORMAL HIGH (ref 100–199)
HDL: 52 mg/dL (ref >39)
LDL Chol Calc (NIH): 172 mg/dL — ABNORMAL HIGH (ref 0–99)
LDL/HDL Ratio: 3.3 {ratio} — ABNORMAL HIGH (ref 0.0–3.2)
Triglycerides: 104 mg/dL (ref 0–149)
VLDL Cholesterol Cal: 18 mg/dL (ref 5–40)

## 2023-09-01 LAB — INSULIN, RANDOM: INSULIN: 4.9 u[IU]/mL (ref 2.6–24.9)

## 2023-09-01 LAB — VITAMIN D 25 HYDROXY (VIT D DEFICIENCY, FRACTURES): Vit D, 25-Hydroxy: 31.6 ng/mL (ref 30.0–100.0)

## 2023-09-03 ENCOUNTER — Other Ambulatory Visit (HOSPITAL_COMMUNITY): Payer: Self-pay

## 2023-09-04 ENCOUNTER — Other Ambulatory Visit (HOSPITAL_COMMUNITY): Payer: Self-pay

## 2023-09-08 ENCOUNTER — Telehealth: Payer: Self-pay

## 2023-09-08 NOTE — Telephone Encounter (Signed)
Started a PA for Trulicity via covermymeds

## 2023-09-10 ENCOUNTER — Other Ambulatory Visit: Payer: Self-pay

## 2023-09-10 ENCOUNTER — Other Ambulatory Visit (HOSPITAL_COMMUNITY): Payer: Self-pay

## 2023-09-18 ENCOUNTER — Other Ambulatory Visit (HOSPITAL_COMMUNITY): Payer: Self-pay

## 2023-09-28 ENCOUNTER — Other Ambulatory Visit (HOSPITAL_COMMUNITY): Payer: Self-pay

## 2023-09-28 DIAGNOSIS — G8929 Other chronic pain: Secondary | ICD-10-CM | POA: Diagnosis not present

## 2023-09-28 DIAGNOSIS — H66002 Acute suppurative otitis media without spontaneous rupture of ear drum, left ear: Secondary | ICD-10-CM | POA: Diagnosis not present

## 2023-09-28 DIAGNOSIS — R051 Acute cough: Secondary | ICD-10-CM | POA: Diagnosis not present

## 2023-09-28 DIAGNOSIS — Z79899 Other long term (current) drug therapy: Secondary | ICD-10-CM | POA: Diagnosis not present

## 2023-09-28 DIAGNOSIS — M545 Low back pain, unspecified: Secondary | ICD-10-CM | POA: Diagnosis not present

## 2023-09-28 MED ORDER — AMOXICILLIN-POT CLAVULANATE 875-125 MG PO TABS
1.0000 | ORAL_TABLET | Freq: Two times a day (BID) | ORAL | 0 refills | Status: AC
  Filled 2023-09-28: qty 20, 10d supply, fill #0

## 2023-09-28 MED ORDER — OXYCODONE-ACETAMINOPHEN 10-325 MG PO TABS
1.0000 | ORAL_TABLET | Freq: Four times a day (QID) | ORAL | 0 refills | Status: DC | PRN
  Filled 2023-09-28: qty 60, 15d supply, fill #0

## 2023-09-28 MED ORDER — PROMETHAZINE-DM 6.25-15 MG/5ML PO SYRP
5.0000 mL | ORAL_SOLUTION | Freq: Four times a day (QID) | ORAL | 0 refills | Status: AC
  Filled 2023-09-28: qty 200, 10d supply, fill #0

## 2023-09-29 ENCOUNTER — Other Ambulatory Visit (HOSPITAL_COMMUNITY): Payer: Self-pay

## 2023-09-29 ENCOUNTER — Other Ambulatory Visit: Payer: Self-pay

## 2023-09-29 MED ORDER — OXYCODONE-ACETAMINOPHEN 10-325 MG PO TABS
1.0000 | ORAL_TABLET | Freq: Four times a day (QID) | ORAL | 0 refills | Status: DC | PRN
  Filled 2023-10-10: qty 60, 15d supply, fill #0

## 2023-10-01 ENCOUNTER — Ambulatory Visit: Payer: 59 | Admitting: Bariatrics

## 2023-10-01 ENCOUNTER — Other Ambulatory Visit (HOSPITAL_COMMUNITY): Payer: Self-pay

## 2023-10-01 ENCOUNTER — Encounter: Payer: Self-pay | Admitting: Bariatrics

## 2023-10-01 VITALS — BP 126/85 | HR 64 | Temp 98.3°F | Ht 64.0 in | Wt 152.0 lb

## 2023-10-01 DIAGNOSIS — E88819 Insulin resistance, unspecified: Secondary | ICD-10-CM | POA: Diagnosis not present

## 2023-10-01 DIAGNOSIS — R632 Polyphagia: Secondary | ICD-10-CM | POA: Diagnosis not present

## 2023-10-01 DIAGNOSIS — Z6826 Body mass index (BMI) 26.0-26.9, adult: Secondary | ICD-10-CM

## 2023-10-01 DIAGNOSIS — F3289 Other specified depressive episodes: Secondary | ICD-10-CM

## 2023-10-01 DIAGNOSIS — E669 Obesity, unspecified: Secondary | ICD-10-CM

## 2023-10-01 MED ORDER — METFORMIN HCL 500 MG PO TABS
500.0000 mg | ORAL_TABLET | Freq: Two times a day (BID) | ORAL | 0 refills | Status: DC
Start: 2023-10-01 — End: 2023-10-28
  Filled 2023-10-01: qty 60, 30d supply, fill #0

## 2023-10-01 MED ORDER — TRULICITY 4.5 MG/0.5ML ~~LOC~~ SOAJ
4.5000 mg | SUBCUTANEOUS | 0 refills | Status: DC
  Filled 2023-10-01 – 2023-10-27 (×3): qty 2, 28d supply, fill #0

## 2023-10-01 MED ORDER — BUPROPION HCL ER (XL) 300 MG PO TB24
300.0000 mg | ORAL_TABLET | Freq: Every morning | ORAL | 1 refills | Status: DC
  Filled 2023-10-01: qty 30, 30d supply, fill #0

## 2023-10-01 NOTE — Progress Notes (Signed)
WEIGHT SUMMARY AND BIOMETRICS  Weight Lost Since Last Visit: 5lb  Weight Gained Since Last Visit: 0lb   Vitals Temp: 98.3 F (36.8 C) BP: 126/85 Pulse Rate: 64 SpO2: 99 %   Anthropometric Measurements Height: 5\' 4"  (1.626 m) Weight: 152 lb (68.9 kg) BMI (Calculated): 26.08 Weight at Last Visit: 157lb Weight Lost Since Last Visit: 5lb Weight Gained Since Last Visit: 0lb Starting Weight: 238lb Total Weight Loss (lbs): 86 lb (39 kg)   Body Composition  Body Fat %: 39.8 % Fat Mass (lbs): 60.8 lbs Muscle Mass (lbs): 87.4 lbs Total Body Water (lbs): 65.2 lbs Visceral Fat Rating : 9   Other Clinical Data Fasting: Yes Labs: No Today's Visit #: 32 Starting Date: 03/21/21    OBESITY Ennifer is here to discuss her progress with her obesity treatment plan along with follow-up of her obesity related diagnoses.    Nutrition Plan: the Category 1 plan - 50% adherence.  Current exercise: weightlifting  Interim History:  She is down 5 lbs since her last visit. She has been taking antibiotic for ear infection.  Eating all of the food on the plan., Protein intake is as prescribed, Is not skipping meals, and Denies polyphagia   Pharmacotherapy: Lucia is on Trulicity 4.5 mg SQ weekly Adverse side effects: None Hunger is moderately controlled.  Cravings are moderately controlled.  Assessment/Plan:  Polyphagia Bernis endorses excessive hunger.  Medication(s): Trulicity and Metformin  Effects of medication:  well controlled. Cravings are well controlled.   Plan: Medication(s): Trulicity 4.5 mg SQ weekly and Metformin 500 mg twice daily with meals Will increase water, protein and fiber to help assuage hunger.  Will minimize foods that have a high glucose index/load to minimize reactive hypoglycemia.   Insulin Resistance Yurianna has had elevated fasting  insulin readings. Goal is HgbA1c < 5.7, fasting insulin at l0 or less, and preferably at 5.  She  denies polyphagia as long as she is taking her medication.  Medication(s): Metformin and Trulicity Lab Results  Component Value Date   HGBA1C 5.3 01/08/2023   Lab Results  Component Value Date   INSULIN 4.9 08/31/2023   INSULIN 9.2 01/08/2023   INSULIN 11.7 11/27/2021   INSULIN 31.4 (H) 03/21/2021    Plan Medication(s): Trulicity 4.5 mg SQ weekly and Metformin 500 mg twice daily with meals Will work on the agreed upon plan. Will minimize refined carbohydrates ( sweets and starches), and focus more on complex carbohydrates.  Increase the micronutrients found in leafy greens, which include magnesium, polyphenols, and vitamin C which have been postulated to help with insulin sensitivity. Minimize "fast food" and cook more meals at home.  Increase fiber to 25 to 30 grams daily.  Will continue to prepare more meals at home.   Eating disorder/emotional eating Adara has had issues with stress eating, emotional eating, nighttime eating, and boredom eating. Currently  this is well controlled. Overall mood is stable. Denies suicidal/homicidal ideation. Medication(s): Bupropion XL 300 mg daily.   Plan:  Motivational interviewing as well as evidence-based interventions for health behavior change were utilized today including the discussion of self monitoring techniques, problem-solving barriers and SMART goal setting techniques.  Discussed distractions to curb eating behaviors. Discussed activities to do with one's hands in the evening  Be sure to get adequate rest as lack of rest can trigger appetite.  Have plan in place for stressful events.  Consider other rewards besides food.   Will incorporate healthy snacks.    Generalized Obesity: Current BMI BMI (Calculated): 26.08   Pharmacotherapy Plan Continue and refill  Trulicity 4.5 mg SQ weekly and Metformin 500 mg twice daily with  meals  Jai is currently in the action stage of change. As such, her goal is to continue with weight loss efforts.  She has agreed to the Category 1 plan.  Exercise goals: All adults should avoid inactivity. Some physical activity is better than none, and adults who participate in any amount of physical activity gain some health benefits.  Behavioral modification strategies: increasing lean protein intake, decreasing simple carbohydrates , no meal skipping, planning for success, increasing vegetables, decrease snacking , and avoiding temptations.  Kalese has agreed to follow-up with our clinic in 4 weeks.       Objective:   VITALS: Per patient if applicable, see vitals. GENERAL: Alert and in no acute distress. CARDIOPULMONARY: No increased WOB. Speaking in clear sentences.  PSYCH: Pleasant and cooperative. Speech normal rate and rhythm. Affect is appropriate. Insight and judgement are appropriate. Attention is focused, linear, and appropriate.  NEURO: Oriented as arrived to appointment on time with no prompting.   Attestation Statements:    This was prepared with the assistance of Engineer, civil (consulting).  Occasional wrong-word or sound-a-like substitutions may have occurred due to the inherent limitations of voice recognition   [name], DO

## 2023-10-02 ENCOUNTER — Other Ambulatory Visit (HOSPITAL_COMMUNITY): Payer: Self-pay

## 2023-10-07 ENCOUNTER — Other Ambulatory Visit (HOSPITAL_COMMUNITY): Payer: Self-pay

## 2023-10-07 MED ORDER — GUAIFENESIN-CODEINE 100-10 MG/5ML PO SOLN
10.0000 mL | Freq: Four times a day (QID) | ORAL | 0 refills | Status: AC | PRN
  Filled 2023-10-07: qty 280, 7d supply, fill #0

## 2023-10-08 DIAGNOSIS — C50911 Malignant neoplasm of unspecified site of right female breast: Secondary | ICD-10-CM | POA: Diagnosis not present

## 2023-10-09 ENCOUNTER — Other Ambulatory Visit: Payer: Self-pay | Admitting: Physician Assistant

## 2023-10-09 DIAGNOSIS — Z9012 Acquired absence of left breast and nipple: Secondary | ICD-10-CM | POA: Diagnosis not present

## 2023-10-09 DIAGNOSIS — C50911 Malignant neoplasm of unspecified site of right female breast: Secondary | ICD-10-CM | POA: Diagnosis not present

## 2023-10-09 DIAGNOSIS — Z1231 Encounter for screening mammogram for malignant neoplasm of breast: Secondary | ICD-10-CM

## 2023-10-10 ENCOUNTER — Other Ambulatory Visit (HOSPITAL_COMMUNITY): Payer: Self-pay

## 2023-10-12 ENCOUNTER — Other Ambulatory Visit (HOSPITAL_COMMUNITY): Payer: Self-pay

## 2023-10-13 ENCOUNTER — Other Ambulatory Visit (HOSPITAL_COMMUNITY): Payer: Self-pay

## 2023-10-14 ENCOUNTER — Ambulatory Visit: Payer: 59

## 2023-10-15 ENCOUNTER — Ambulatory Visit: Payer: 59 | Admitting: Dermatology

## 2023-10-15 ENCOUNTER — Other Ambulatory Visit (HOSPITAL_COMMUNITY): Payer: Self-pay

## 2023-10-16 ENCOUNTER — Other Ambulatory Visit (HOSPITAL_COMMUNITY): Payer: Self-pay

## 2023-10-19 DIAGNOSIS — C50911 Malignant neoplasm of unspecified site of right female breast: Secondary | ICD-10-CM | POA: Diagnosis not present

## 2023-10-27 ENCOUNTER — Other Ambulatory Visit (HOSPITAL_COMMUNITY): Payer: Self-pay

## 2023-10-27 DIAGNOSIS — M549 Dorsalgia, unspecified: Secondary | ICD-10-CM | POA: Diagnosis not present

## 2023-10-27 DIAGNOSIS — M129 Arthropathy, unspecified: Secondary | ICD-10-CM | POA: Diagnosis not present

## 2023-10-27 DIAGNOSIS — E559 Vitamin D deficiency, unspecified: Secondary | ICD-10-CM | POA: Diagnosis not present

## 2023-10-27 DIAGNOSIS — Z131 Encounter for screening for diabetes mellitus: Secondary | ICD-10-CM | POA: Diagnosis not present

## 2023-10-27 DIAGNOSIS — E7800 Pure hypercholesterolemia, unspecified: Secondary | ICD-10-CM | POA: Diagnosis not present

## 2023-10-27 DIAGNOSIS — G8929 Other chronic pain: Secondary | ICD-10-CM | POA: Diagnosis not present

## 2023-10-27 DIAGNOSIS — Z79899 Other long term (current) drug therapy: Secondary | ICD-10-CM | POA: Diagnosis not present

## 2023-10-27 MED ORDER — NALOXONE HCL 4 MG/0.1ML NA LIQD
NASAL | 3 refills | Status: AC
Start: 2023-10-27 — End: ?
  Filled 2023-10-27: qty 2, 1d supply, fill #0

## 2023-10-27 MED ORDER — OXYCODONE-ACETAMINOPHEN 10-325 MG PO TABS
1.0000 | ORAL_TABLET | Freq: Four times a day (QID) | ORAL | 0 refills | Status: AC | PRN
  Filled 2023-10-27: qty 120, 30d supply, fill #0

## 2023-10-28 ENCOUNTER — Other Ambulatory Visit (HOSPITAL_COMMUNITY): Payer: Self-pay

## 2023-10-28 ENCOUNTER — Encounter: Payer: Self-pay | Admitting: Bariatrics

## 2023-10-28 ENCOUNTER — Ambulatory Visit (INDEPENDENT_AMBULATORY_CARE_PROVIDER_SITE_OTHER): Payer: 59 | Admitting: Bariatrics

## 2023-10-28 VITALS — BP 130/82 | HR 64 | Temp 98.2°F | Ht 64.0 in | Wt 160.0 lb

## 2023-10-28 DIAGNOSIS — E88819 Insulin resistance, unspecified: Secondary | ICD-10-CM

## 2023-10-28 DIAGNOSIS — E559 Vitamin D deficiency, unspecified: Secondary | ICD-10-CM

## 2023-10-28 DIAGNOSIS — R632 Polyphagia: Secondary | ICD-10-CM | POA: Diagnosis not present

## 2023-10-28 DIAGNOSIS — F5089 Other specified eating disorder: Secondary | ICD-10-CM

## 2023-10-28 DIAGNOSIS — F3289 Other specified depressive episodes: Secondary | ICD-10-CM

## 2023-10-28 DIAGNOSIS — E669 Obesity, unspecified: Secondary | ICD-10-CM

## 2023-10-28 DIAGNOSIS — Z6827 Body mass index (BMI) 27.0-27.9, adult: Secondary | ICD-10-CM

## 2023-10-28 MED ORDER — VITAMIN D (ERGOCALCIFEROL) 1.25 MG (50000 UNIT) PO CAPS
50000.0000 [IU] | ORAL_CAPSULE | ORAL | 0 refills | Status: DC
  Filled 2023-10-28: qty 6, 84d supply, fill #0

## 2023-10-28 MED ORDER — WEGOVY 0.25 MG/0.5ML ~~LOC~~ SOAJ
0.2500 mg | SUBCUTANEOUS | 0 refills | Status: DC
  Filled 2023-10-28: qty 2, 28d supply, fill #0

## 2023-10-28 MED ORDER — METFORMIN HCL 500 MG PO TABS
500.0000 mg | ORAL_TABLET | Freq: Two times a day (BID) | ORAL | 0 refills | Status: DC
  Filled 2023-10-28: qty 60, 30d supply, fill #0

## 2023-10-28 MED ORDER — BUPROPION HCL ER (SR) 150 MG PO TB12
150.0000 mg | ORAL_TABLET | Freq: Every day | ORAL | 0 refills | Status: DC
  Filled 2023-10-28: qty 30, 30d supply, fill #0

## 2023-10-28 NOTE — Progress Notes (Signed)
 WEIGHT SUMMARY AND BIOMETRICS  Weight Lost Since Last Visit: 0lb  Weight Gained Since Last Visit: 8lb   Vitals Temp: 98.2 F (36.8 C) BP: 130/82 Pulse Rate: 64 SpO2: 98 %   Anthropometric Measurements Height: 5\' 4"  (1.626 m) Weight: 160 lb (72.6 kg) BMI (Calculated): 27.45 Weight at Last Visit: 152lb Weight Lost Since Last Visit: 0lb Weight Gained Since Last Visit: 8lb Starting Weight: 238lb Total Weight Loss (lbs): 78 lb (35.4 kg)   Body Composition  Body Fat %: 41.4 % Fat Mass (lbs): 66.4 lbs Muscle Mass (lbs): 89 lbs Total Body Water (lbs): 67.4 lbs Visceral Fat Rating : 10   Other Clinical Data Fasting: No Labs: No Today's Visit #: 48 Starting Date: 03/21/21    OBESITY Jacqueline Good is here to discuss her progress with her obesity treatment plan along with follow-up of her obesity related diagnoses.    Nutrition Plan: the Category 1 plan - 50% adherence.  Current exercise:  chair exercises  Interim History:  She gained 8 lbs since her last visit.  Eating all of the food on the plan., Water intake is adequate., Reports polyphagia, and Reports excessive cravings.   Pharmacotherapy: Jacqueline Good is on Trulicity 4.5 mg SQ weekly. Patient states she has not been able to get the Trulicity and that her insurance told her it was not FDA approved.  Adverse side effects: None Hunger is poorly controlled.  Cravings are poorly controlled.  Assessment/Plan:   Polyphagia Jacqueline Good endorses excessive hunger.  Medication(s): Trulicity, and Metformin. She has been unable to get Trulicity.  Effects of medication:  poorly controlled. Cravings are poorly controlled.   Plan: Medication(s): Wegovy 0.25 mg SQ weekly and Metformin 500 mg twice daily with meals Will increase water, protein and fiber to help assuage hunger.  Will minimize foods that have a high glucose  index/load to minimize reactive hypoglycemia.   Insulin Resistance Jacqueline Good has had elevated fasting insulin readings. Goal is HgbA1c < 5.7, fasting insulin at l0 or less, and preferably at 5.  She reports polyphagia. Medication(s): Trulicity not covered.  Lab Results  Component Value Date   HGBA1C 5.3 01/08/2023   Lab Results  Component Value Date   INSULIN 4.9 08/31/2023   INSULIN 9.2 01/08/2023   INSULIN 11.7 11/27/2021   INSULIN 31.4 (H) 03/21/2021    Plan Medication(s): Wegovy 0.25 mg SQ weekly Will work on the agreed upon plan. Will minimize refined carbohydrates ( sweets and starches), and focus more on complex carbohydrates.  Increase the micronutrients found in leafy greens, which include magnesium, polyphenols, and vitamin C which have been postulated to help with insulin sensitivity. Minimize "fast food" and cook more meals at home.  Increase fiber to 25 to 30 grams daily.   Eating disorder/emotional eating Jacqueline Good has had issues with stress eating, emotional eating, nighttime eating, and boredom eating. Currently this is poorly controlled.  Overall mood is stable. Denies suicidal/homicidal ideation. Medication(s): Wellbutrin 150 mg daily in the am  Plan:  Specifically regarding patient's less desirable eating habits and patterns, we employed the technique of small changes when she cannot fully commit to her prudent nutritional plan. Discussed distractions to curb eating behaviors. Discussed activities to do with one's hands in the evening  Be sure to get adequate rest as lack of rest can trigger appetite.  Have plan in place for stressful events.  Consider other rewards besides food.    Vitamin D Deficiency Vitamin D is not at goal of 50.  Most recent vitamin D level was 31.6. She is on  prescription ergocalciferol 50,000 IU weekly. Lab Results  Component Value Date   VD25OH 31.6 08/31/2023   VD25OH 58.3 01/08/2023   VD25OH 33.2 11/27/2021    Plan: Refill  prescription vitamin D 50,000 IU weekly.       Generalized Obesity: Current BMI BMI (Calculated): 27.45   Pharmacotherapy Plan Start  Wegovy 0.25 mg SQ weekly  Jacqueline Good is currently in the action stage of change. As such, her goal is to continue with weight loss efforts.  She has agreed to the Category 1 plan and keeping a food journal with goal of 1,100 calories and 70 to 80 grams of protein daily.  Exercise goals: All adults should avoid inactivity. Some physical activity is better than none, and adults who participate in any amount of physical activity gain some health benefits.  Behavioral modification strategies: increasing lean protein intake, decrease eating out, meal planning , increase water intake, better snacking choices, planning for success, increasing vegetables, get rid of junk food in the home, decrease snacking , avoiding temptations, keep healthy foods in the home, and mindful eating.  Jacqueline Good has agreed to follow-up with our clinic in 4 weeks.      Objective:   VITALS: Per patient if applicable, see vitals. GENERAL: Alert and in no acute distress. CARDIOPULMONARY: No increased WOB. Speaking in clear sentences.  PSYCH: Pleasant and cooperative. Speech normal rate and rhythm. Affect is appropriate. Insight and judgement are appropriate. Attention is focused, linear, and appropriate.  NEURO: Oriented as arrived to appointment on time with no prompting.   Attestation Statements:   This was prepared with the assistance of Engineer, civil (consulting).  Occasional wrong-word or sound-a-like substitutions may have occurred due to the inherent limitations of voice recognition   Jacqueline Capra, DO

## 2023-10-29 ENCOUNTER — Telehealth: Payer: Self-pay

## 2023-10-29 ENCOUNTER — Telehealth: Payer: Self-pay | Admitting: Bariatrics

## 2023-10-29 NOTE — Telephone Encounter (Signed)
 PA submitted through Cover My Meds for [facility]. Awaiting insurance determination. Key: Jacqueline Good

## 2023-10-29 NOTE — Telephone Encounter (Signed)
 Pt will like for a nurse to call her back about a PA.

## 2023-10-29 NOTE — Telephone Encounter (Signed)
 Per patients insurance: Request Reference Number: [id number]. WEGOVY INJ 0.25MG  is denied due to Plan Exclusion.

## 2023-10-30 ENCOUNTER — Other Ambulatory Visit: Payer: Self-pay

## 2023-10-30 ENCOUNTER — Ambulatory Visit
Admission: RE | Admit: 2023-10-30 | Discharge: 2023-10-30 | Disposition: A | Discharge status: Home / Self Care | Source: Ambulatory Visit | Attending: Physician Assistant | Admitting: Physician Assistant

## 2023-10-30 ENCOUNTER — Other Ambulatory Visit (HOSPITAL_COMMUNITY): Payer: Self-pay

## 2023-10-30 DIAGNOSIS — Z1231 Encounter for screening mammogram for malignant neoplasm of breast: Secondary | ICD-10-CM

## 2023-10-30 DIAGNOSIS — Z79899 Other long term (current) drug therapy: Secondary | ICD-10-CM | POA: Diagnosis not present

## 2023-11-02 ENCOUNTER — Other Ambulatory Visit (HOSPITAL_COMMUNITY): Payer: Self-pay

## 2023-11-02 MED ORDER — PROAIR RESPICLICK 108 (90 BASE) MCG/ACT IN AEPB
2.0000 | INHALATION_SPRAY | RESPIRATORY_TRACT | 0 refills | Status: AC
  Filled 2023-11-02 – 2024-07-26 (×2): qty 1, 17d supply, fill #0

## 2023-11-02 NOTE — Telephone Encounter (Signed)
 Left message for patient to return call.

## 2023-11-03 ENCOUNTER — Other Ambulatory Visit (HOSPITAL_COMMUNITY): Payer: Self-pay

## 2023-11-04 ENCOUNTER — Other Ambulatory Visit (HOSPITAL_COMMUNITY): Payer: Self-pay

## 2023-11-04 DIAGNOSIS — Z5181 Encounter for therapeutic drug level monitoring: Secondary | ICD-10-CM | POA: Diagnosis not present

## 2023-11-04 MED ORDER — ZOLPIDEM TARTRATE 10 MG PO TABS
10.0000 mg | ORAL_TABLET | Freq: Every day | ORAL | 1 refills | Status: DC
  Filled 2023-11-13: qty 30, 30d supply, fill #0
  Filled 2023-12-31: qty 30, 30d supply, fill #1

## 2023-11-04 MED ORDER — LURASIDONE HCL 40 MG PO TABS
40.0000 mg | ORAL_TABLET | Freq: Every day | ORAL | 1 refills | Status: AC
  Filled 2023-11-13: qty 30, 30d supply, fill #0
  Filled 2023-12-31: qty 30, 30d supply, fill #1

## 2023-11-04 MED ORDER — SERTRALINE HCL 100 MG PO TABS
200.0000 mg | ORAL_TABLET | Freq: Every morning | ORAL | 1 refills | Status: DC
Start: 2023-11-04 — End: 2024-01-14
  Filled 2023-11-04: qty 60, 30d supply, fill #0
  Filled 2023-11-13 – 2023-12-31 (×2): qty 60, 30d supply, fill #1

## 2023-11-04 MED ORDER — BUPROPION HCL ER (XL) 300 MG PO TB24
300.0000 mg | ORAL_TABLET | Freq: Every morning | ORAL | 1 refills | Status: DC
  Filled 2023-11-04: qty 30, 30d supply, fill #0
  Filled 2023-11-13: qty 30, 30d supply, fill #1

## 2023-11-04 MED ORDER — ALPRAZOLAM 1 MG PO TABS
ORAL_TABLET | ORAL | 1 refills | Status: DC
  Filled 2023-11-13: qty 75, 30d supply, fill #0
  Filled 2023-12-31: qty 75, 30d supply, fill #1

## 2023-11-04 MED ORDER — HYDROXYZINE HCL 25 MG PO TABS
25.0000 mg | ORAL_TABLET | Freq: Every day | ORAL | 0 refills | Status: DC | PRN
Start: 2023-11-04 — End: 2023-12-31
  Filled 2023-11-04: qty 30, 15d supply, fill #0

## 2023-11-05 ENCOUNTER — Ambulatory Visit (INDEPENDENT_AMBULATORY_CARE_PROVIDER_SITE_OTHER): Admitting: Bariatrics

## 2023-11-05 ENCOUNTER — Other Ambulatory Visit (HOSPITAL_COMMUNITY): Payer: Self-pay

## 2023-11-05 ENCOUNTER — Encounter: Payer: Self-pay | Admitting: Bariatrics

## 2023-11-05 VITALS — BP 115/76 | HR 66 | Temp 98.4°F | Ht 64.0 in | Wt 162.0 lb

## 2023-11-05 DIAGNOSIS — E88819 Insulin resistance, unspecified: Secondary | ICD-10-CM

## 2023-11-05 DIAGNOSIS — R632 Polyphagia: Secondary | ICD-10-CM

## 2023-11-05 DIAGNOSIS — E669 Obesity, unspecified: Secondary | ICD-10-CM | POA: Diagnosis not present

## 2023-11-05 DIAGNOSIS — Z6827 Body mass index (BMI) 27.0-27.9, adult: Secondary | ICD-10-CM

## 2023-11-05 MED ORDER — PHENTERMINE HCL 30 MG PO CAPS
30.0000 mg | ORAL_CAPSULE | ORAL | 0 refills | Status: DC
  Filled 2023-11-05: qty 30, 30d supply, fill #0

## 2023-11-05 MED ORDER — TRULICITY 1.5 MG/0.5ML ~~LOC~~ SOAJ
1.5000 mg | SUBCUTANEOUS | 0 refills | Status: DC
  Filled 2023-11-05 – 2023-11-10 (×2): qty 2, 28d supply, fill #0

## 2023-11-05 NOTE — Progress Notes (Signed)
 WEIGHT SUMMARY AND BIOMETRICS  Weight Lost Since Last Visit: 0  Weight Gained Since Last Visit: 2lb   Vitals Temp: 98.4 F (36.9 C) BP: 115/76 Pulse Rate: 66 SpO2: 95 %   Anthropometric Measurements Height: 5\' 4"  (1.626 m) Weight: 162 lb (73.5 kg) BMI (Calculated): 27.79 Weight at Last Visit: 160lb Weight Lost Since Last Visit: 0 Weight Gained Since Last Visit: 2lb Starting Weight: 238lb Total Weight Loss (lbs): 76 lb (34.5 kg)   Body Composition  Body Fat %: 43 % Fat Mass (lbs): 69.6 lbs Muscle Mass (lbs): 87.6 lbs Total Body Water (lbs): 69.6 lbs Visceral Fat Rating : 10   Other Clinical Data Fasting: no Labs: no Today's Visit #: 34 Starting Date: 03/21/21    OBESITY Madoline is here to discuss her progress with her obesity treatment plan along with follow-up of her obesity related diagnoses.    Nutrition Plan: the Category 1 plan - 50% adherence.  Current exercise: none  Interim History:  She is up 2 lbs since her last visit. She is going through a lot of stress due to multiple family members being ill.  Not eating all of the food on the plan., Protein intake is less than prescribed., Is exceeding snack calorie allotment, and Water intake is adequate.   Pharmacotherapy: Wajiha was prescribed Wegovy but it is a plan exclusion. She had been on Trulicity, but her plan changed.  Adverse side effects: None Hunger is moderately controlled.  Cravings are poorly controlled.  Assessment/Plan:   Polyphagia Aianna endorses excessive hunger.  Medication(s): Metformin 500 mg BID Effects of medication on appetite:  moderately controlled. Cravings are poorly controlled.   Plan: Medication(s): Trulicity 4.5 mg SQ weekly and Metformin 500 mg Bid Will increase water, protein and fiber to help assuage hunger.  Will minimize foods that have a high  glucose index/load to minimize reactive hypoglycemia.  She will pick healthier snacks. She will cook more meals at home. She will walk on a regular basis.   Insulin Resistance Delainie has had elevated fasting insulin readings. Goal is HgbA1c < 5.7, fasting insulin at l0 or less, and preferably at 5.  She reports polyphagia. Medication(s): Metformin  Lab Results  Component Value Date   HGBA1C 5.3 01/08/2023   Lab Results  Component Value Date   INSULIN 4.9 08/31/2023   INSULIN 9.2 01/08/2023   INSULIN 11.7 11/27/2021   INSULIN 31.4 (H) 03/21/2021    Plan Medication(s): Trulicity 1.5 mg SQ weekly Phentermine 30 mg daily in the am.  Will work on the agreed upon plan. Will minimize refined carbohydrates ( sweets and starches), and focus more on complex carbohydrates.  Increase the micronutrients found in leafy greens, which include magnesium, polyphenols, and vitamin C which have been postulated to help with insulin sensitivity. Minimize "fast food" and cook more meals at home.  Increase fiber to 25  to 30 grams daily.  Will work on techniques to help with stress relief, like breathing strategies, exercise.     Generalized Obesity: Current BMI BMI (Calculated): 27.79   Pharmacotherapy Plan Start  Phentermine 30 mg by mouth daily in am and will resume Trulicity at 1.5 mg   Taheera is currently in the action stage of change. As such, her goal is to continue with weight loss efforts.  She has agreed to the Category 1 plan.  Exercise goals: For substantial health benefits, adults should do at least 150 minutes (2 hours and 30 minutes) a week of moderate-intensity, or 75 minutes (1 hour and 15 minutes) a week of vigorous-intensity aerobic physical activity, or an equivalent combination of moderate- and vigorous-intensity aerobic activity. Aerobic activity should be performed in episodes of at least 10 minutes, and preferably, it should be spread throughout the week.  Behavioral  modification strategies: increasing lean protein intake, decreasing simple carbohydrates , no meal skipping, decrease eating out, meal planning , increase water intake, better snacking choices, planning for success, get rid of junk food in the home, decrease snacking , ways to avoid night time snacking, avoiding temptations, keep healthy foods in the home, measure portion sizes, and mindful eating.  Laiza has agreed to follow-up with our clinic in 4 weeks.       Objective:   VITALS: Per patient if applicable, see vitals. GENERAL: Alert and in no acute distress. CARDIOPULMONARY: No increased WOB. Speaking in clear sentences.  PSYCH: Pleasant and cooperative. Speech normal rate and rhythm. Affect is appropriate. Insight and judgement are appropriate. Attention is focused, linear, and appropriate.  NEURO: Oriented as arrived to appointment on time with no prompting.   Attestation Statements:    This was prepared with the assistance of Engineer, civil (consulting).  Occasional wrong-word or sound-a-like substitutions may have occurred due to the inherent limitations of voice recognition   [name], DO

## 2023-11-06 ENCOUNTER — Other Ambulatory Visit: Payer: Self-pay

## 2023-11-09 ENCOUNTER — Other Ambulatory Visit (HOSPITAL_COMMUNITY): Payer: Self-pay

## 2023-11-09 ENCOUNTER — Telehealth (INDEPENDENT_AMBULATORY_CARE_PROVIDER_SITE_OTHER): Payer: Self-pay

## 2023-11-09 DIAGNOSIS — F17210 Nicotine dependence, cigarettes, uncomplicated: Secondary | ICD-10-CM | POA: Diagnosis not present

## 2023-11-09 DIAGNOSIS — R051 Acute cough: Secondary | ICD-10-CM | POA: Diagnosis not present

## 2023-11-09 DIAGNOSIS — E7800 Pure hypercholesterolemia, unspecified: Secondary | ICD-10-CM | POA: Diagnosis not present

## 2023-11-09 DIAGNOSIS — Z87891 Personal history of nicotine dependence: Secondary | ICD-10-CM | POA: Diagnosis not present

## 2023-11-09 DIAGNOSIS — I10 Essential (primary) hypertension: Secondary | ICD-10-CM | POA: Diagnosis not present

## 2023-11-09 MED ORDER — GUAIFENESIN-CODEINE 100-10 MG/5ML PO SOLN
10.0000 mL | Freq: Four times a day (QID) | ORAL | 0 refills | Status: AC | PRN
  Filled 2023-11-09 – 2024-01-19 (×2): qty 280, 7d supply, fill #0

## 2023-11-09 NOTE — Telephone Encounter (Signed)
 We are unable to process your request for prior authorization for TRULICITY INJ 1.5/0.5 for the above member due to [employer] has a denied request on file for TRULICITY INJ 1.5/0.5 for this member. Please refer to the appeals process outlined in the original denial or contact Prior Authorization Department at [phone] for further questions.

## 2023-11-10 ENCOUNTER — Other Ambulatory Visit (HOSPITAL_COMMUNITY): Payer: Self-pay

## 2023-11-13 ENCOUNTER — Other Ambulatory Visit (HOSPITAL_COMMUNITY): Payer: Self-pay

## 2023-11-19 ENCOUNTER — Other Ambulatory Visit (HOSPITAL_COMMUNITY): Payer: Self-pay

## 2023-11-25 ENCOUNTER — Ambulatory Visit: Admitting: Bariatrics

## 2023-11-26 ENCOUNTER — Other Ambulatory Visit (HOSPITAL_COMMUNITY): Payer: Self-pay

## 2023-11-26 DIAGNOSIS — G8929 Other chronic pain: Secondary | ICD-10-CM | POA: Diagnosis not present

## 2023-11-26 DIAGNOSIS — M549 Dorsalgia, unspecified: Secondary | ICD-10-CM | POA: Diagnosis not present

## 2023-11-26 DIAGNOSIS — R748 Abnormal levels of other serum enzymes: Secondary | ICD-10-CM | POA: Diagnosis not present

## 2023-11-26 DIAGNOSIS — Z79899 Other long term (current) drug therapy: Secondary | ICD-10-CM | POA: Diagnosis not present

## 2023-11-26 MED ORDER — OXYCODONE-ACETAMINOPHEN 10-325 MG PO TABS
1.0000 | ORAL_TABLET | Freq: Four times a day (QID) | ORAL | 0 refills | Status: DC | PRN
  Filled 2023-11-26: qty 120, 30d supply, fill #0

## 2023-12-01 DIAGNOSIS — Z79899 Other long term (current) drug therapy: Secondary | ICD-10-CM | POA: Diagnosis not present

## 2023-12-03 ENCOUNTER — Ambulatory Visit: Admitting: Bariatrics

## 2023-12-07 ENCOUNTER — Ambulatory Visit (INDEPENDENT_AMBULATORY_CARE_PROVIDER_SITE_OTHER): Admitting: Bariatrics

## 2023-12-07 ENCOUNTER — Other Ambulatory Visit (HOSPITAL_COMMUNITY): Payer: Self-pay

## 2023-12-07 ENCOUNTER — Encounter: Payer: Self-pay | Admitting: Bariatrics

## 2023-12-07 VITALS — BP 119/78 | HR 71 | Temp 98.3°F | Ht 64.0 in | Wt 156.0 lb

## 2023-12-07 DIAGNOSIS — F5089 Other specified eating disorder: Secondary | ICD-10-CM | POA: Diagnosis not present

## 2023-12-07 DIAGNOSIS — E88819 Insulin resistance, unspecified: Secondary | ICD-10-CM

## 2023-12-07 DIAGNOSIS — R632 Polyphagia: Secondary | ICD-10-CM

## 2023-12-07 DIAGNOSIS — F3289 Other specified depressive episodes: Secondary | ICD-10-CM

## 2023-12-07 DIAGNOSIS — E669 Obesity, unspecified: Secondary | ICD-10-CM

## 2023-12-07 DIAGNOSIS — E559 Vitamin D deficiency, unspecified: Secondary | ICD-10-CM

## 2023-12-07 DIAGNOSIS — Z6826 Body mass index (BMI) 26.0-26.9, adult: Secondary | ICD-10-CM

## 2023-12-07 MED ORDER — PHENTERMINE HCL 30 MG PO CAPS
30.0000 mg | ORAL_CAPSULE | ORAL | 0 refills | Status: DC
  Filled 2023-12-07: qty 30, 30d supply, fill #0

## 2023-12-07 MED ORDER — METFORMIN HCL 500 MG PO TABS
500.0000 mg | ORAL_TABLET | Freq: Two times a day (BID) | ORAL | 0 refills | Status: DC
Start: 2023-12-07 — End: 2024-01-05
  Filled 2023-12-07: qty 60, 30d supply, fill #0

## 2023-12-07 MED ORDER — TRULICITY 1.5 MG/0.5ML ~~LOC~~ SOAJ
1.5000 mg | SUBCUTANEOUS | 0 refills | Status: DC
  Filled 2023-12-07 – 2023-12-21 (×2): qty 2, 28d supply, fill #0

## 2023-12-07 MED ORDER — BUPROPION HCL ER (SR) 150 MG PO TB12
150.0000 mg | ORAL_TABLET | Freq: Every day | ORAL | 0 refills | Status: DC
  Filled 2023-12-07: qty 30, 30d supply, fill #0

## 2023-12-07 NOTE — Progress Notes (Signed)
 WEIGHT SUMMARY AND BIOMETRICS   Weight Gained Since Last Visit: 0lb   Vitals Temp: 98.3 F (36.8 C) BP: 119/78 Pulse Rate: 71 SpO2: 98 %   Anthropometric Measurements Height: 5\' 4"  (1.626 m) Weight: 156 lb (70.8 kg) BMI (Calculated): 26.76 Weight at Last Visit: 162lb Weight Lost Since Last Visit: 6lb Weight Gained Since Last Visit: 0lb Starting Weight: 238lb Total Weight Loss (lbs): 82 lb (37.2 kg)   Body Composition  Body Fat %: 40.1 % Fat Mass (lbs): 62.8 lbs Muscle Mass (lbs): 89.2 lbs Total Body Water (lbs): 66.2 lbs Visceral Fat Rating : 10   Other Clinical Data Fasting: No Labs: No Today's Visit #: 35 Starting Date: 03/21/21    OBESITY Jacqueline Good is here to discuss her progress with her obesity treatment plan along with follow-up of her obesity related diagnoses.   Nutrition Plan: the Category 1 plan - 50 % adherence.  Current exercise:  Chair exercises  Interim History:  She has lost 6 lbs since his/her last visit.   Eating all of the food on the plan., Protein intake is as prescribed, and Water intake is adequate.  Pharmacotherapy: Jacqueline Good is on Metformin  500 mg twice daily with meals and Phentermine  30 mg by mouth daily in am Adverse side effects: None Hunger is poorly controlled.  Cravings are poorly controlled.    Assessment/Plan:   Polyphagia Jacqueline Good endorses excessive hunger.  Medication(s): see list.  Effects of medication:  moderately controlled. Cravings are poorly controlled.   Plan: Medication(s): Trulicity  1.5 mg SQ weekly, Metformin  500 mg twice daily with meals, and Phentermine  30 mg by mouth daily in am Will increase water, protein and fiber to help assuage hunger.  Will minimize foods that have a high glucose index/load to minimize reactive hypoglycemia.   Vitamin  D Deficiency Vitamin D  is at goal of 50.  Most recent vitamin D  level was 31.6. She is on  prescription ergocalciferol  50,000 IU weekly. Lab Results  Component Value Date   VD25OH 31.6 08/31/2023   VD25OH 58.3 01/08/2023   VD25OH 33.2 11/27/2021    Plan: Refill prescription vitamin D  50,000 IU weekly.   Insulin  Resistance Jacqueline Good has had elevated fasting insulin  readings. Goal is HgbA1c < 5.7, fasting insulin  at l0 or less, and preferably at 5.  She reports polyphagia. Medication(s): see medication list.  Lab Results  Component Value Date   HGBA1C 5.3 01/08/2023   Lab Results  Component Value Date   INSULIN  4.9 08/31/2023   INSULIN  9.2 01/08/2023   INSULIN  11.7 11/27/2021   INSULIN  31.4 (H) 03/21/2021  Plan Medication(s): Trulicity  1.5 mg SQ weekly, Metformin  500 mg twice daily with meals, and Phentermine  30 mg by mouth daily in am Will work on the agreed upon plan. Will minimize refined carbohydrates ( sweets and starches), and focus more on complex carbohydrates.  Increase the micronutrients found in leafy greens, which include magnesium , polyphenols, and vitamin C which have been postulated to help with insulin  sensitivity. Minimize "fast food" and cook more meals at home.  Increase fiber to 25 to 30 grams daily.  Information sheet on " Insulin  Resistance and Prediabetes".    Eating disorder/emotional eating ( other depression)  Jacqueline Good has had issues with stress eating, emotional eating, and boredom eating. Currently this is moderately controlled. Overall mood is stable. Denies suicidal/homicidal ideation. Medication(s): Wellbutrin  150 mg daily in the am  Plan:  Motivational interviewing as well as evidence-based interventions for health behavior change were utilized today including the discussion of self monitoring techniques, problem-solving barriers and SMART goal setting techniques.  Consider a referral to Dr. Delaine Favorite, Jacqueline Good psychologist to help with eating  behaviors.  Discussed distractions to curb eating behaviors. Discussed activities to do with one's hands in the evening  Be sure to get adequate rest as lack of rest can trigger appetite.  Have plan in place for stressful events.  Consider other rewards besides food.     Generalized Obesity: Current BMI 26  Pharmacotherapy Plan Continue and refill  Trulicity  1.5 mg SQ weekly, Metformin  500 mg twice daily with meals, and Phentermine  30 mg by mouth daily in am  Jacqueline Good is currently in the action stage of change. As such, her goal is to continue with weight loss efforts.  She has agreed to the Category 1 plan.  Exercise goals: All adults should avoid inactivity. Some physical activity is better than none, and adults who participate in any amount of physical activity gain some health benefits.  Behavioral modification strategies: increasing lean protein intake, decreasing simple carbohydrates , no meal skipping, decrease eating out, meal planning , increase water intake, better snacking choices, and planning for success.  Jacqueline Good has agreed to follow-up with our clinic in 4 weeks.       Objective:   VITALS: Per patient if applicable, see vitals. GENERAL: Alert and in no acute distress. CARDIOPULMONARY: No increased WOB. Speaking in clear sentences.  PSYCH: Pleasant and cooperative. Speech normal rate and rhythm. Affect is appropriate. Insight and judgement are appropriate. Attention is focused, linear, and appropriate.  NEURO: Oriented as arrived to appointment on time with no prompting.   Attestation Statements:   This was prepared with the assistance of Engineer, civil (consulting).  Occasional wrong-word or sound-a-like substitutions may have occurred due to the inherent limitations of voice recognition software.    Jacqueline Peper, DO

## 2023-12-08 ENCOUNTER — Other Ambulatory Visit (HOSPITAL_COMMUNITY): Payer: Self-pay

## 2023-12-10 ENCOUNTER — Other Ambulatory Visit (HOSPITAL_COMMUNITY): Payer: Self-pay

## 2023-12-11 ENCOUNTER — Other Ambulatory Visit (HOSPITAL_COMMUNITY): Payer: Self-pay

## 2023-12-15 ENCOUNTER — Other Ambulatory Visit (HOSPITAL_COMMUNITY): Payer: Self-pay

## 2023-12-16 ENCOUNTER — Other Ambulatory Visit (HOSPITAL_COMMUNITY): Payer: Self-pay

## 2023-12-17 ENCOUNTER — Other Ambulatory Visit (HOSPITAL_COMMUNITY): Payer: Self-pay

## 2023-12-21 ENCOUNTER — Other Ambulatory Visit (HOSPITAL_COMMUNITY): Payer: Self-pay

## 2023-12-22 ENCOUNTER — Telehealth: Payer: Self-pay | Admitting: Bariatrics

## 2023-12-22 NOTE — Telephone Encounter (Signed)
 Pt received an email that her Rx was denied again. Pt would like for you to start an appeal.

## 2023-12-22 NOTE — Telephone Encounter (Signed)
 Spoke to patient and notified her that per her insurance she needs to have Type 2 diabetes for her medication to be covered patient verbalized understanding.

## 2023-12-25 ENCOUNTER — Other Ambulatory Visit (HOSPITAL_COMMUNITY): Payer: Self-pay

## 2023-12-25 DIAGNOSIS — M549 Dorsalgia, unspecified: Secondary | ICD-10-CM | POA: Diagnosis not present

## 2023-12-25 DIAGNOSIS — Z79899 Other long term (current) drug therapy: Secondary | ICD-10-CM | POA: Diagnosis not present

## 2023-12-25 DIAGNOSIS — G8929 Other chronic pain: Secondary | ICD-10-CM | POA: Diagnosis not present

## 2023-12-25 DIAGNOSIS — R748 Abnormal levels of other serum enzymes: Secondary | ICD-10-CM | POA: Diagnosis not present

## 2023-12-25 MED ORDER — OXYCODONE-ACETAMINOPHEN 10-325 MG PO TABS
1.0000 | ORAL_TABLET | Freq: Four times a day (QID) | ORAL | 0 refills | Status: DC | PRN
  Filled 2023-12-25: qty 120, 30d supply, fill #0

## 2023-12-29 ENCOUNTER — Other Ambulatory Visit (HOSPITAL_COMMUNITY): Payer: Self-pay

## 2023-12-29 DIAGNOSIS — Z79899 Other long term (current) drug therapy: Secondary | ICD-10-CM | POA: Diagnosis not present

## 2023-12-29 NOTE — Telephone Encounter (Signed)
 Spoke to patient and notified her that Wegovy  was denied by her insurance due to it being a plan exclusion. Patient verbalized understanding.

## 2023-12-29 NOTE — Telephone Encounter (Signed)
 Pt is calling requesting a Rx for wagovy. Can you please send Rx in to [facility].

## 2023-12-31 ENCOUNTER — Other Ambulatory Visit (HOSPITAL_COMMUNITY): Payer: Self-pay

## 2023-12-31 MED ORDER — HYDROXYZINE HCL 25 MG PO TABS
25.0000 mg | ORAL_TABLET | Freq: Every day | ORAL | 0 refills | Status: AC | PRN
  Filled 2023-12-31: qty 30, 15d supply, fill #0

## 2024-01-05 ENCOUNTER — Encounter: Payer: Self-pay | Admitting: Bariatrics

## 2024-01-05 ENCOUNTER — Other Ambulatory Visit (HOSPITAL_COMMUNITY): Payer: Self-pay

## 2024-01-05 ENCOUNTER — Ambulatory Visit (INDEPENDENT_AMBULATORY_CARE_PROVIDER_SITE_OTHER): Admitting: Bariatrics

## 2024-01-05 DIAGNOSIS — E669 Obesity, unspecified: Secondary | ICD-10-CM | POA: Diagnosis not present

## 2024-01-05 DIAGNOSIS — R632 Polyphagia: Secondary | ICD-10-CM | POA: Diagnosis not present

## 2024-01-05 DIAGNOSIS — E88819 Insulin resistance, unspecified: Secondary | ICD-10-CM

## 2024-01-05 DIAGNOSIS — F3289 Other specified depressive episodes: Secondary | ICD-10-CM | POA: Diagnosis not present

## 2024-01-05 DIAGNOSIS — Z6826 Body mass index (BMI) 26.0-26.9, adult: Secondary | ICD-10-CM

## 2024-01-05 MED ORDER — BUPROPION HCL ER (SR) 150 MG PO TB12
150.0000 mg | ORAL_TABLET | Freq: Every day | ORAL | 0 refills | Status: DC
  Filled 2024-01-05: qty 30, 30d supply, fill #0

## 2024-01-05 MED ORDER — METFORMIN HCL 500 MG PO TABS
500.0000 mg | ORAL_TABLET | Freq: Two times a day (BID) | ORAL | 0 refills | Status: DC
  Filled 2024-01-05: qty 60, 30d supply, fill #0

## 2024-01-05 MED ORDER — VITAMIN D (ERGOCALCIFEROL) 1.25 MG (50000 UNIT) PO CAPS
50000.0000 [IU] | ORAL_CAPSULE | ORAL | 0 refills | Status: DC
  Filled 2024-01-05: qty 6, 84d supply, fill #0

## 2024-01-05 MED ORDER — PHENTERMINE HCL 30 MG PO CAPS
30.0000 mg | ORAL_CAPSULE | ORAL | 0 refills | Status: AC
  Filled 2024-01-05: qty 30, 30d supply, fill #0

## 2024-01-05 MED ORDER — TIRZEPATIDE 2.5 MG/0.5ML ~~LOC~~ SOAJ: 2.5000 mg | SUBCUTANEOUS | 0 refills | Status: AC

## 2024-01-05 NOTE — Progress Notes (Signed)
 WEIGHT SUMMARY AND BIOMETRICS  Weight Lost Since Last Visit: 0  Weight Gained Since Last Visit: 1lb   Vitals Temp: 98.5 F (36.9 C) BP: 105/77 Pulse Rate: 75 SpO2: 100 %   Anthropometric Measurements Height: 5\' 4"  (1.626 m) Weight: 157 lb (71.2 kg) BMI (Calculated): 26.94 Weight at Last Visit: 156lb Weight Lost Since Last Visit: 0 Weight Gained Since Last Visit: 1lb Starting Weight: 238lb Total Weight Loss (lbs): 81 lb (36.7 kg)   Body Composition  Body Fat %: 40.7 % Fat Mass (lbs): 63.8 lbs Muscle Mass (lbs): 88.4 lbs Total Body Water (lbs): 64.4 lbs Visceral Fat Rating : 10   Other Clinical Data Fasting: no Labs: no Today's Visit #: 68 Starting Date: 03/21/21    OBESITY Briseida is here to discuss her progress with her obesity treatment plan along with follow-up of her obesity related diagnoses.    Nutrition Plan: the Category 1 plan - 60% adherence.  Current exercise: stationary bike  Interim History:  She is up 1 lb since her last visit.  Eating all of the food on the plan., Is not skipping meals, Not journaling consistently., Water intake is inadequate., and Reports polyphagia   Pharmacotherapy: Morayo is on Phentermine  30 mg by mouth daily in am Adverse side effects: None Hunger is moderately controlled.  Cravings are moderately controlled.  Assessment/Plan:    Polyphagia Juanitta endorses excessive hunger.  Medication(s): She had been on Trulicity  in the past and had done well with both appetite and cravings but her insurance will no longer cover the medication.  We had also tried to get her ever GLP-1's but her insurance will not cover any other medications and she cannot afford to pay out-of-pocket for the medicine. Effects of medication:  moderately controlled. Cravings are moderately controlled.   Plan: Medication(s):  Mounjaro  2.5 mg SQ weekly, Metformin  500 mg twice daily with meals, and Phentermine  30 mg by mouth daily in am.  I did give her a coupon for 1 month supply of Mounjaro  2.5 mg subcu weekly to help her get to her goal weight and hopefully after this she can maintain her weight. The coupon was for Mounjaro  2.5 mg weekly RX [id number], PCN: [id number], group #[id number], RD # [id number], Exp: 12/25.   ll increase water, protein and fiber to help assuage hunger.  Will minimize foods that have a high glucose index/load to minimize reactive hypoglycemia.   Insulin  Resistance Yaquelin has had elevated fasting insulin  readings. Goal is HgbA1c < 5.7, fasting insulin  at l0 or less, and preferably at 5.  She reports  polyphagia. Medication(s): Metformin  and Phentermine .  Lab Results  Component Value Date   HGBA1C 5.3 01/08/2023   Lab Results  Component Value Date   INSULIN  4.9 08/31/2023   INSULIN  9.2 01/08/2023   INSULIN  11.7 11/27/2021   INSULIN  31.4 (H) 03/21/2021    Plan  Medication(s): Mounjaro 2.5 mg SQ weekly, Metformin  500 mg twice daily with meals, and Phentermine  30 mg by mouth daily in am Will work on the agreed upon plan. Will minimize refined carbohydrates ( sweets and starches), and focus more on complex carbohydrates.  Increase the micronutrients found in leafy greens, which include magnesium , polyphenols, and vitamin C which have been postulated to help with insulin  sensitivity. Minimize "fast food" and cook more meals at home.  Increase fiber to 25 to 30 grams daily.  She will eat more raw vegetables and will have 2 cups of vegetables at 60 cal if she becomes extremely hungry.  Other depression/emotional eating Jourdyn has had issues with stress eating, emotional eating, nighttime eating, and boredom eating. Currently this is moderately controlled. Overall mood is stable. Denies suicidal/homicidal ideation. Medication(s): Wellbutrin  150 mg daily in the am  Plan:  Specifically  regarding patient's less desirable eating habits and patterns, we employed the technique of small changes when she cannot fully commit to her prudent nutritional plan. Will pick healthier snacks. Discussed distractions to curb eating behaviors. Be sure to get adequate rest as lack of rest can trigger appetite.  Have plan in place for stressful events.  Consider other rewards besides food.     Generalized Obesity: Current BMI BMI (Calculated): 26.94   Pharmacotherapy Plan Continue and refill  Metformin  500 mg twice daily with meals and Phentermine  30 mg by mouth daily in am  Priscila is currently in the action stage of change. As such, her goal is to continue with weight loss efforts.  She has agreed to the Category 1 plan.  Exercise goals: For substantial health benefits, adults should do at least 150 minutes (2 hours and 30 minutes) a week of moderate-intensity, or 75 minutes (1 hour and 15 minutes) a week of vigorous-intensity aerobic physical activity, or an equivalent combination of moderate- and vigorous-intensity aerobic activity. Aerobic activity should be performed in episodes of at least 10 minutes, and preferably, it should be spread throughout the week.  Behavioral modification strategies: increasing lean protein intake, no meal skipping, meal planning , increase water intake, better snacking choices, planning for success, increasing fiber rich foods, decreasing sodium intake, get rid of junk food in the home, decrease snacking , increase frequency of journaling, and mindful eating.  Lenise has agreed to follow-up with our clinic in 4 weeks.   Objective:   VITALS: Per patient if applicable, see vitals. GENERAL: Alert and in no acute distress. CARDIOPULMONARY: No increased WOB. Speaking in clear sentences.  PSYCH: Pleasant and cooperative. Speech normal rate and rhythm. Affect is appropriate. Insight and judgement are appropriate. Attention is focused, linear, and appropriate.   NEURO: Oriented as arrived to appointment on time with no prompting.   Attestation Statements:   This was prepared with the assistance of Engineer, civil (consulting).  Occasional wrong-word or sound-a-like substitutions may have occurred due to the inherent limitations of voice recognition   Kirk Peper, DO

## 2024-01-06 ENCOUNTER — Telehealth: Payer: Self-pay

## 2024-01-06 NOTE — Telephone Encounter (Signed)
 PA for Mounjaro 2.5 has been submitted, awaiting PA questions.

## 2024-01-06 NOTE — Telephone Encounter (Signed)
 PA for Mounjaro 2.5 has been denied. PA is now complete.      Message from Plan Request Reference Number: [id number]. MOUNJARO INJ 2.5/0.5 is denied due to Plan Exclusion. For further questions, call [phone].

## 2024-01-06 NOTE — Telephone Encounter (Signed)
 PA questions for Mounjaro 2.5  have been answered and all documentation has been included. Waiting on a determination.

## 2024-01-07 ENCOUNTER — Telehealth (INDEPENDENT_AMBULATORY_CARE_PROVIDER_SITE_OTHER): Payer: Self-pay

## 2024-01-07 NOTE — Telephone Encounter (Signed)
 PA for Mounjaro 2.5 has been submitted, awaiting PA questions.

## 2024-01-07 NOTE — Telephone Encounter (Signed)
 We received the following message in regards to our PA for Mounjaro 2.5   Information regarding your request We are unable to process your request for prior authorization for MOUNJARO INJ 2.5/0.5 for the above member due to [employer] has a denied request on file for MOUNJARO INJ 2.5/0.5 for this member. Please refer to the appeals process outlined in the original denial or contact Prior Authorization Department at [phone] for further questions.

## 2024-01-11 ENCOUNTER — Other Ambulatory Visit (HOSPITAL_COMMUNITY): Payer: Self-pay

## 2024-01-14 ENCOUNTER — Ambulatory Visit: Payer: 59 | Admitting: Adult Health

## 2024-01-14 ENCOUNTER — Other Ambulatory Visit: Payer: 59

## 2024-01-14 ENCOUNTER — Other Ambulatory Visit (HOSPITAL_COMMUNITY): Payer: Self-pay

## 2024-01-14 MED ORDER — ZOLPIDEM TARTRATE 10 MG PO TABS
ORAL_TABLET | ORAL | 1 refills | Status: AC
  Filled 2024-01-14 – 2024-02-02 (×2): qty 30, 30d supply, fill #0
  Filled 2024-06-29: qty 30, 30d supply, fill #1

## 2024-01-14 MED ORDER — SERTRALINE HCL 100 MG PO TABS
150.0000 mg | ORAL_TABLET | Freq: Every morning | ORAL | 1 refills | Status: AC
  Filled 2024-01-14 – 2024-06-29 (×3): qty 45, 30d supply, fill #0

## 2024-01-14 MED ORDER — BUPROPION HCL ER (XL) 300 MG PO TB24
300.0000 mg | ORAL_TABLET | Freq: Every morning | ORAL | 1 refills | Status: AC
  Filled 2024-01-14: qty 30, 30d supply, fill #0

## 2024-01-14 MED ORDER — ALPRAZOLAM 1 MG PO TABS
1.0000 mg | ORAL_TABLET | Freq: Every morning | ORAL | 1 refills | Status: AC
  Filled 2024-01-14: qty 75, 75d supply, fill #0
  Filled 2024-02-02: qty 75, 30d supply, fill #0
  Filled 2024-06-29: qty 75, 30d supply, fill #1

## 2024-01-14 MED ORDER — LURASIDONE HCL 60 MG PO TABS
60.0000 mg | ORAL_TABLET | Freq: Every day | ORAL | 1 refills | Status: AC
  Filled 2024-01-14 – 2024-08-30 (×2): qty 30, 30d supply, fill #0

## 2024-01-19 ENCOUNTER — Other Ambulatory Visit (HOSPITAL_COMMUNITY): Payer: Self-pay

## 2024-01-20 ENCOUNTER — Other Ambulatory Visit: Payer: Self-pay | Admitting: *Deleted

## 2024-01-20 DIAGNOSIS — C50411 Malignant neoplasm of upper-outer quadrant of right female breast: Secondary | ICD-10-CM

## 2024-01-21 ENCOUNTER — Inpatient Hospital Stay (HOSPITAL_BASED_OUTPATIENT_CLINIC_OR_DEPARTMENT_OTHER): Admitting: Adult Health

## 2024-01-21 ENCOUNTER — Encounter: Payer: Self-pay | Admitting: Adult Health

## 2024-01-21 ENCOUNTER — Inpatient Hospital Stay: Attending: Adult Health

## 2024-01-21 VITALS — BP 114/70 | HR 94 | Temp 97.8°F | Resp 16 | Ht 64.0 in | Wt 162.1 lb

## 2024-01-21 DIAGNOSIS — C50411 Malignant neoplasm of upper-outer quadrant of right female breast: Secondary | ICD-10-CM

## 2024-01-21 DIAGNOSIS — Z9221 Personal history of antineoplastic chemotherapy: Secondary | ICD-10-CM | POA: Diagnosis not present

## 2024-01-21 DIAGNOSIS — Z171 Estrogen receptor negative status [ER-]: Secondary | ICD-10-CM | POA: Diagnosis not present

## 2024-01-21 DIAGNOSIS — Z853 Personal history of malignant neoplasm of breast: Secondary | ICD-10-CM | POA: Insufficient documentation

## 2024-01-21 DIAGNOSIS — Z9011 Acquired absence of right breast and nipple: Secondary | ICD-10-CM | POA: Insufficient documentation

## 2024-01-21 LAB — CBC WITH DIFFERENTIAL (CANCER CENTER ONLY)
Abs Immature Granulocytes: 0.01 10*3/uL (ref 0.00–0.07)
Basophils Absolute: 0 10*3/uL (ref 0.0–0.1)
Basophils Relative: 0 %
Eosinophils Absolute: 0.1 10*3/uL (ref 0.0–0.5)
Eosinophils Relative: 2 %
HCT: 37.4 % (ref 36.0–46.0)
Hemoglobin: 12.4 g/dL (ref 12.0–15.0)
Immature Granulocytes: 0 %
Lymphocytes Relative: 47 %
Lymphs Abs: 3.1 10*3/uL (ref 0.7–4.0)
MCH: 28.4 pg (ref 26.0–34.0)
MCHC: 33.2 g/dL (ref 30.0–36.0)
MCV: 85.8 fL (ref 80.0–100.0)
Monocytes Absolute: 0.4 10*3/uL (ref 0.1–1.0)
Monocytes Relative: 6 %
Neutro Abs: 3 10*3/uL (ref 1.7–7.7)
Neutrophils Relative %: 45 %
Platelet Count: 235 10*3/uL (ref 150–400)
RBC: 4.36 MIL/uL (ref 3.87–5.11)
RDW: 13.2 % (ref 11.5–15.5)
WBC Count: 6.7 10*3/uL (ref 4.0–10.5)
nRBC: 0 % (ref 0.0–0.2)

## 2024-01-21 LAB — CMP (CANCER CENTER ONLY)
ALT: 56 U/L — ABNORMAL HIGH (ref 0–44)
AST: 40 U/L (ref 15–41)
Albumin: 3.7 g/dL (ref 3.5–5.0)
Alkaline Phosphatase: 120 U/L (ref 38–126)
Anion gap: 11 (ref 5–15)
BUN: 19 mg/dL (ref 8–23)
CO2: 25 mmol/L (ref 22–32)
Calcium: 9.5 mg/dL (ref 8.9–10.3)
Chloride: 104 mmol/L (ref 98–111)
Creatinine: 0.98 mg/dL (ref 0.44–1.00)
GFR, Estimated: >60 mL/min (ref >60)
Glucose, Bld: 90 mg/dL (ref 70–99)
Potassium: 4.3 mmol/L (ref 3.5–5.1)
Sodium: 140 mmol/L (ref 135–145)
Total Bilirubin: 0.6 mg/dL (ref 0.0–1.2)
Total Protein: 7.2 g/dL (ref 6.5–8.1)

## 2024-01-21 NOTE — Progress Notes (Signed)
 [facility] Cancer Follow up:    Jacqueline Oscar, PA [address] [place] [address]   DIAGNOSIS:  Cancer Staging  Malignant neoplasm of upper-outer quadrant of right breast in female, estrogen receptor negative (HCC) Staging form: Breast, AJCC 8th Edition - Clinical stage from 03/03/2018: Stage IIA (cT1mi, cN1, cM0, G2, ER-, PR-, HER2+) - Unsigned Histologic grading system: 3 grade system Laterality: Right Stage used in treatment planning: Yes National guidelines used in treatment planning: Yes Type of national guideline used in treatment planning: NCCN - Pathologic stage from 08/12/2018: No Stage Recommended (ypT0, pN0, cM0) - Unsigned Stage prefix: Post-therapy    SUMMARY OF ONCOLOGIC HISTORY: Oncology History  Malignant neoplasm of upper-outer quadrant of right breast in female, estrogen receptor negative (HCC)  02/25/2018 Initial Diagnosis   Routine screening mammography revealed right breast abnormality. Diagnostic mammogram and ultrasound revealed a breast mass with a second 0.6 cm lesion. Biopsy ([id number]) revealed IDC, grade 2 arising in the background of high grade DCIS. ER 0%, PR 0%, Ki67 50%. HER2 positive by FISH.    03/23/2018 - 03/31/2019 Chemotherapy   The patient had palonosetron  (ALOXI ) injection 0.25 mg, 0.25 mg, Intravenous,  Once, 6 of 6 cycles  Administration: 0.25 mg (03/23/2018), 0.25 mg (04/13/2018), 0.25 mg (05/04/2018), 0.25 mg (06/24/2018), 0.25 mg (07/12/2018), 0.25 mg (05/31/2018)    pegfilgrastim  (NEULASTA ) injection 6 mg, 6 mg, Subcutaneous, Once, 4 of 4 cycles  Administration: 6 mg (03/25/2018), 6 mg (04/15/2018), 6 mg (05/06/2018), 6 mg (07/14/2018)    trastuzumab  (HERCEPTIN ) 750 mg in sodium chloride  0.9 % 250 mL chemo infusion, 777 mg, Intravenous,  Once, 16 of 16 cycles  Administration: 750 mg (03/23/2018), 600 mg (04/13/2018), 600 mg (05/04/2018), 600 mg (06/24/2018), 600 mg (07/12/2018), 600 mg (08/06/2018), 588 mg  (09/22/2018), 600 mg (12/15/2018), 600 mg (01/28/2019), 600 mg (02/17/2019), 600 mg (05/31/2018), 588 mg (09/01/2018), 600 mg (01/05/2019), 588 mg (10/13/2018), 600 mg (03/10/2019), 600 mg (03/31/2019)    CARBOplatin  (PARAPLATIN ) 720 mg in sodium chloride  0.9 % 250 mL chemo infusion, 720 mg (100 % of original dose 721 mg), Intravenous,  Once, 6 of 6 cycles  Dose modification:   (original dose 721 mg, Cycle 1)  Administration: 720 mg (03/23/2018), 650 mg (04/13/2018), 670 mg (05/04/2018), 710 mg (06/24/2018), 620 mg (07/12/2018), 710 mg (05/31/2018)    DOCEtaxel  (TAXOTERE ) 160 mg in sodium chloride  0.9 % 250 mL chemo infusion, 75 mg/m2 = 160 mg, Intravenous,  Once, 3 of 3 cycles  Administration: 160 mg (03/23/2018), 160 mg (04/13/2018), 160 mg (05/04/2018)    gemcitabine  (GEMZAR ) 1,672 mg in sodium chloride  0.9 % 250 mL chemo infusion, 800 mg/m2 = 1,672 mg (100 % of original dose 800 mg/m2), Intravenous,  Once, 3 of 3 cycles  Dose modification: 800 mg/m2 (original dose 800 mg/m2, Cycle 4, Reason: Provider Judgment)  Administration: 1,672 mg (05/31/2018), 1,672 mg (06/24/2018), 1,672 mg (07/12/2018)    pertuzumab  (PERJETA ) 840 mg in sodium chloride  0.9 % 250 mL chemo infusion, 840 mg, Intravenous, Once, 1 of 1 cycle  Administration: 840 mg (03/23/2018)    fosaprepitant  (EMEND ) 150 mg, dexamethasone  (DECADRON ) 12 mg in sodium chloride  0.9 % 145 mL IVPB, , Intravenous,  Once, 6 of 6 cycles  Administration:  (03/23/2018),  (04/13/2018),  (05/04/2018),  (06/24/2018),  (07/12/2018),  (05/31/2018)  for chemotherapy treatment.     08/12/2018 Surgery   Right modified radical mastectomy. 0/11 lymph nodes positive for carcinoma. Stage ypT0 ypN0 M0.  09/29/2018 Genetic Testing   Negative genetic testing on the common hereditary cancer panel.  The Hereditary Gene Panel offered by [facility] includes sequencing and/or deletion duplication testing of the following 47 genes: APC, ATM, AXIN2, BARD1, BMPR1A, BRCA1, BRCA2,  BRIP1, CDH1, CDK4, CDKN2A (p14ARF), CDKN2A (p16INK4a), CHEK2, CTNNA1, DICER1, EPCAM (Deletion/duplication testing only), GREM1 (promoter region deletion/duplication testing only), KIT, MEN1, MLH1, MSH2, MSH3, MSH6, MUTYH, NBN, NF1, NHTL1, PALB2, PDGFRA, PMS2, POLD1, POLE, PTEN, RAD50, RAD51C, RAD51D, SDHB, SDHC, SDHD, SMAD4, SMARCA4. STK11, TP53, TSC1, TSC2, and VHL.  The following genes were evaluated for sequence changes only: SDHA and HOXB13 c.251G>A variant only. The report date is September 29, 2018.   11/15/2018 - 12/17/2018 Radiation Therapy   The patient initially received a dose of 50 Gy in 25 fractions to the breast using whole-breast tangent fields. This was delivered using a 3-D conformal technique. The patient also received a dose of 50 Gy in 25 fractions to the axilla. The total dose was 100 Gy.      CURRENT THERAPY: observation  INTERVAL HISTORY:  Jacqueline Good 63 y.o. female returns for f/u of her history of breast cancer.  She has had a lot family stress recently and is tearful today due to this.  She is status post right mastectomy and left breast mammogram occurred on 10/30/2023 demonstrating no mammographic evidence of malignancy and breast density category C.  She denies any new pain or concerns today.     Patient Active Problem List   Diagnosis Date Noted   Elevated cholesterol 09/01/2023   Obesity, Class I, BMI 30-34.9 05/26/2023   BMI 33.0-33.9,adult 11/03/2022   Essential hypertension 08/21/2022   Polyphagia 04/23/2022   Depression 01/08/2022   Chronic superficial gastritis without bleeding 08/06/2021   Gastroduodenitis 08/06/2021   Dysphagia 08/06/2021   Stricture of esophagus 08/06/2021   Personal history of malignant neoplasm of breast 08/06/2021   Insulin  resistance 04/26/2021   Vitamin D  deficiency 03/26/2021   Postoperative breast asymmetry 09/13/2019   Genetic testing 10/01/2018   Family history of breast cancer    Family history of uterine cancer     Family history of prostate cancer    S/P mastectomy, right 08/20/2018   Acquired absence of right breast 08/20/2018   MDD (major depressive disorder), recurrent episode, moderate (HCC) 06/13/2018   Generalized anxiety disorder 06/13/2018   OCD (obsessive compulsive disorder) 06/13/2018   Insomnia 06/13/2018   Malignant neoplasm of upper-outer quadrant of right breast in female, estrogen receptor negative (HCC) 03/02/2018   Chronic bilateral low back pain without sciatica 06/03/2017   Arthritis 04/27/2014   Fatigue 06/10/2013   Morbid obesity (HCC) 06/10/2013   Asthma, chronic obstructive, with acute exacerbation (HCC) 06/10/2013   Allergy 06/10/2013    is allergic to gadolinium derivatives, hydrocodone , and iodinated contrast media.  MEDICAL HISTORY: Past Medical History:  Diagnosis Date   Anemia    Anxiety    Arthritis    all over (08/12/2018)   Asthma    Back pain    Benign paroxysmal positional vertigo 10/31/2014   Breast cancer, right breast (HCC) dx'd 02/2018   Chronic bronchitis (HCC)    Depression    Family history of breast cancer    Family history of prostate cancer    Family history of uterine cancer    GERD (gastroesophageal reflux disease)    History of blood transfusion    several; related to low blood (08/12/2018)   History of radiation therapy 11/15/18- 12/17/18   Right Chest  wall, SCV, PAB. 25 fractions.    Lymphedema of right arm    Palpitations    Personal history of chemotherapy    Personal history of radiation therapy     SURGICAL HISTORY: Past Surgical History:  Procedure Laterality Date   AXILLARY LYMPH NODE DISSECTION Right 08/12/2018   Procedure: AXILLARY LYMPH NODE DISSECTION;  Surgeon: Juanita Norlander, MD;  Location: [facility] OR;  Service: General;  Laterality: Right;   BREAST BIOPSY Right 03/2018   BREAST RECONSTRUCTION WITH PLACEMENT OF TISSUE EXPANDER AND FLEX HD (ACELLULAR HYDRATED DERMIS) Right 08/12/2018   Procedure: RIGHT BREAST RECONSTRUCTION WITH  PLACEMENT OF TISSUE EXPANDER AND FLEX HD (ACELLULAR HYDRATED DERMIS);  Surgeon: Thornell Flirt, DO;  Location: [facility] OR;  Service: Plastics;  Laterality: Right;   BREAST REDUCTION SURGERY Left 09/28/2019   Procedure: LEFT MAMMARY REDUCTION/MASTOPEXY  (BREAST);  Surgeon: Thornell Flirt, DO;  Location: [facility];  Service: Plastics;  Laterality: Left;   DILATION AND CURETTAGE OF UTERUS     ENDOMETRIAL ABLATION     IR IMAGING GUIDED PORT INSERTION  03/18/2018   MASTECTOMY MODIFIED RADICAL Right 08/12/2018   Procedure: RIGHT MODIFIED RADICAL MASTECTOMY;  Surgeon: Juanita Norlander, MD;  Location: [facility] OR;  Service: General;  Laterality: Right;   MASTECTOMY MODIFIED RADICAL Right 08/12/2018   w/axillary LND   MYOMECTOMY     REDUCTION MAMMAPLASTY Left 2021   REMOVAL OF TISSUE EXPANDER AND PLACEMENT OF IMPLANT Right 10/26/2018   Procedure: Removal of right breast expander;  Surgeon: Thornell Flirt, DO;  Location: [facility] OR;  Service: Plastics;  Laterality: Right;  75 min, please    SOCIAL HISTORY: Social History   Socioeconomic History   Marital status: Single    Spouse name: Not on file   Number of children: Not on file   Years of education: Not on file   Highest education level: Not on file  Occupational History   Not on file  Tobacco Use   Smoking status: Former    Current packs/day: 0.00    Average packs/day: 1 pack/day for 38.0 years (38.0 ttl pk-yrs)    Types: Cigarettes    Start date: 05/15/1980    Quit date: 05/15/2018    Years since quitting: 5.6   Smokeless tobacco: Never  Vaping Use   Vaping status: Never Used  Substance and Sexual Activity   Alcohol use: Not Currently    Comment: occasional drink   Drug use: No   Sexual activity: Not Currently    Birth control/protection: None  Other Topics Concern   Not on file  Social History Narrative   Right Handed   Lives in a one story home   Drinks a cup of coffee every so often    Social Drivers of Manufacturing engineer Strain: Not on file  Food Insecurity: Not on file  Transportation Needs: No Transportation Needs (09/07/2018)   PRAPARE - Administrator, Civil Service (Medical): No    Lack of Transportation (Non-Medical): No  Physical Activity: Not on file  Stress: Not on file  Social Connections: Unknown (12/24/2021)   [facility]   Social Network    Social Network: Not on file  Intimate Partner Violence: Unknown (11/15/2021)   Received from [facility]   HITS    Physically Hurt: Not on file    Insult or Talk Down To: Not on file    Threaten Physical Harm: Not on file    Scream  or Curse: Not on file    FAMILY HISTORY: Family History  Problem Relation Age of Onset   Breast cancer Mother 15   Pancreatic cancer Mother    Lupus Sister    Heart disease Sister    Diabetes Maternal Grandmother    Heart Problems Maternal Grandmother    Prostate cancer Maternal Grandfather    Prostate cancer Paternal Grandfather    Prostate cancer Paternal Uncle    Uterine cancer Maternal Great-grandmother    Prostate cancer Other        MGFs brother   Breast cancer Other        Mat great-grandmother's sister   Prostate cancer Other        PGFs brother    Review of Systems  Constitutional:  Negative for appetite change, chills, fatigue, fever and unexpected weight change.  HENT:   Negative for hearing loss, lump/mass and trouble swallowing.   Eyes:  Negative for eye problems and icterus.  Respiratory:  Negative for chest tightness, cough and shortness of breath.   Cardiovascular:  Negative for chest pain, leg swelling and palpitations.  Gastrointestinal:  Negative for abdominal distention, abdominal pain, constipation, diarrhea, nausea and vomiting.  Endocrine: Negative for hot flashes.  Genitourinary:  Negative for difficulty urinating.   Musculoskeletal:  Negative for arthralgias.  Skin:  Negative for itching and rash.  Neurological:  Negative for  dizziness, extremity weakness, headaches and numbness.  Hematological:  Negative for adenopathy. Does not bruise/bleed easily.  Psychiatric/Behavioral:  Negative for depression. The patient is not nervous/anxious.       PHYSICAL EXAMINATION    Vitals:   01/21/24 1345  BP: 114/70  Pulse: 94  Resp: 16  Temp: 97.8 F (36.6 C)    Physical Exam Constitutional:      General: She is not in acute distress.    Appearance: Normal appearance. She is not toxic-appearing.  HENT:     Head: Normocephalic and atraumatic.     Mouth/Throat:     Mouth: Mucous membranes are moist.     Pharynx: Oropharynx is clear. No oropharyngeal exudate or posterior oropharyngeal erythema.   Eyes:     General: No scleral icterus.   Cardiovascular:     Rate and Rhythm: Normal rate and regular rhythm.     Pulses: Normal pulses.     Heart sounds: Normal heart sounds.  Pulmonary:     Effort: Pulmonary effort is normal.     Breath sounds: Normal breath sounds.  Chest:     Comments: Right breast s/p mastectomy, no sign of local recurrence, left breast benign Abdominal:     General: Abdomen is flat. Bowel sounds are normal. There is no distension.     Palpations: Abdomen is soft.     Tenderness: There is no abdominal tenderness.   Musculoskeletal:        General: No swelling.     Cervical back: Neck supple.  Lymphadenopathy:     Cervical: No cervical adenopathy.     Upper Body:     Right upper body: No supraclavicular or axillary adenopathy.     Left upper body: No supraclavicular or axillary adenopathy.   Skin:    General: Skin is warm and dry.     Findings: No rash.   Neurological:     General: No focal deficit present.     Mental Status: She is alert.   Psychiatric:        Mood and Affect: Mood normal.  Behavior: Behavior normal.     LABORATORY DATA:  CBC    Component Value Date/Time   WBC 6.7 01/21/2024 1334   WBC 6.7 08/15/2020 1441   RBC 4.36 01/21/2024 1334   HGB 12.4  01/21/2024 1334   HCT 37.4 01/21/2024 1334   PLT 235 01/21/2024 1334   MCV 85.8 01/21/2024 1334   MCH 28.4 01/21/2024 1334   MCHC 33.2 01/21/2024 1334   RDW 13.2 01/21/2024 1334   LYMPHSABS 3.1 01/21/2024 1334   MONOABS 0.4 01/21/2024 1334   EOSABS 0.1 01/21/2024 1334   BASOSABS 0.0 01/21/2024 1334    CMP     Component Value Date/Time   NA 140 01/21/2024 1334   NA 141 08/31/2023 1050   K 4.3 01/21/2024 1334   CL 104 01/21/2024 1334   CO2 25 01/21/2024 1334   GLUCOSE 90 01/21/2024 1334   BUN 19 01/21/2024 1334   BUN 16 08/31/2023 1050   CREATININE 0.98 01/21/2024 1334   CREATININE 0.83 09/24/2016 1151   CALCIUM 9.5 01/21/2024 1334   PROT 7.2 01/21/2024 1334   PROT 6.7 08/31/2023 1050   ALBUMIN 3.7 01/21/2024 1334   ALBUMIN 4.4 08/31/2023 1050   AST 40 01/21/2024 1334   ALT 56 (H) 01/21/2024 1334   ALKPHOS 120 01/21/2024 1334   BILITOT 0.6 01/21/2024 1334   GFRNONAA >60 01/21/2024 1334   GFRNONAA 79 09/24/2016 1151   GFRAA >60 02/09/2020 1409   GFRAA >89 09/24/2016 1151     ASSESSMENT and THERAPY PLAN:   Malignant neoplasm of upper-outer quadrant of right breast in female, estrogen receptor negative (HCC) Jacqueline Good is a 63 year old woman with h/o stage IIA ER/PR negative, HER2 positive right breast cancer diagnosed in 2019 s/p neoadjuvant chemotherapy, right mastectomy, and adjuvant radiation therapy.    History of stage IIA breast cancer: She has no clinical or radiographic signs of recurrence.  Continue with annual left breast screening mammograms; next due 10/2024.   I recommended healthy diet, exercise, and that Jacqueline stay up to date with her preventative care with her PCP I offered a social work referral to help connect her with resources for her family stress and she declined today.   RTC in 1 year for continued long term f/u.     All questions were answered. The patient knows to call the clinic with any problems, questions or concerns. We can certainly see  the patient much sooner if necessary.  Total encounter time:20 minutes*in face-to-face visit time, chart review, lab review, care coordination, order entry, and documentation of the encounter time.    Alwin Baars, NP 01/24/24 9:03 PM Medical Oncology and Hematology [facility] [address] [place], [place] [address] Tel. [phone]    Fax. [phone]  *Total Encounter Time as defined by the Centers for Medicare and Medicaid Services includes, in addition to the face-to-face time of a patient visit (documented in the note above) non-face-to-face time: obtaining and reviewing outside history, ordering and reviewing medications, tests or procedures, care coordination (communications with other health care professionals or caregivers) and documentation in the medical record.

## 2024-01-24 NOTE — Assessment & Plan Note (Signed)
 Jacqueline Good is a 63 year old woman with h/o stage IIA ER/PR negative, HER2 positive right breast cancer diagnosed in 2019 s/p neoadjuvant chemotherapy, right mastectomy, and adjuvant radiation therapy.    History of stage IIA breast cancer: She has no clinical or radiographic signs of recurrence.  Continue with annual left breast screening mammograms; next due 10/2024.   I recommended healthy diet, exercise, and that [name] stay up to date with her preventative care with her PCP I offered a social work referral to help connect her with resources for her family stress and she declined today.   RTC in 1 year for continued long term f/u.

## 2024-01-25 ENCOUNTER — Other Ambulatory Visit (HOSPITAL_COMMUNITY): Payer: Self-pay

## 2024-01-25 DIAGNOSIS — Z79899 Other long term (current) drug therapy: Secondary | ICD-10-CM | POA: Diagnosis not present

## 2024-01-25 DIAGNOSIS — M549 Dorsalgia, unspecified: Secondary | ICD-10-CM | POA: Diagnosis not present

## 2024-01-25 DIAGNOSIS — G8929 Other chronic pain: Secondary | ICD-10-CM | POA: Diagnosis not present

## 2024-01-25 DIAGNOSIS — R748 Abnormal levels of other serum enzymes: Secondary | ICD-10-CM | POA: Diagnosis not present

## 2024-01-25 MED ORDER — OXYCODONE-ACETAMINOPHEN 10-325 MG PO TABS
1.0000 | ORAL_TABLET | Freq: Four times a day (QID) | ORAL | 0 refills | Status: DC | PRN
  Filled 2024-01-25: qty 120, 30d supply, fill #0

## 2024-01-26 ENCOUNTER — Other Ambulatory Visit (HOSPITAL_COMMUNITY): Payer: Self-pay

## 2024-01-27 DIAGNOSIS — Z79899 Other long term (current) drug therapy: Secondary | ICD-10-CM | POA: Diagnosis not present

## 2024-01-29 ENCOUNTER — Other Ambulatory Visit (HOSPITAL_COMMUNITY): Payer: Self-pay

## 2024-02-01 DIAGNOSIS — C50411 Malignant neoplasm of upper-outer quadrant of right female breast: Secondary | ICD-10-CM | POA: Diagnosis not present

## 2024-02-02 ENCOUNTER — Encounter: Payer: Self-pay | Admitting: Bariatrics

## 2024-02-02 ENCOUNTER — Ambulatory Visit (INDEPENDENT_AMBULATORY_CARE_PROVIDER_SITE_OTHER): Admitting: Bariatrics

## 2024-02-02 ENCOUNTER — Other Ambulatory Visit (HOSPITAL_COMMUNITY): Payer: Self-pay

## 2024-02-02 VITALS — BP 121/72 | HR 84 | Temp 98.1°F | Ht 64.0 in | Wt 152.0 lb

## 2024-02-02 DIAGNOSIS — E559 Vitamin D deficiency, unspecified: Secondary | ICD-10-CM | POA: Diagnosis not present

## 2024-02-02 DIAGNOSIS — I10 Essential (primary) hypertension: Secondary | ICD-10-CM

## 2024-02-02 DIAGNOSIS — Z6826 Body mass index (BMI) 26.0-26.9, adult: Secondary | ICD-10-CM | POA: Diagnosis not present

## 2024-02-02 DIAGNOSIS — E669 Obesity, unspecified: Secondary | ICD-10-CM | POA: Diagnosis not present

## 2024-02-02 MED ORDER — VITAMIN D (ERGOCALCIFEROL) 1.25 MG (50000 UNIT) PO CAPS
50000.0000 [IU] | ORAL_CAPSULE | ORAL | 0 refills | Status: DC
  Filled 2024-02-02: qty 6, 84d supply, fill #0

## 2024-02-02 NOTE — Progress Notes (Signed)
 WEIGHT SUMMARY AND BIOMETRICS  Weight Lost Since Last Visit: 5lb  Weight Gained Since Last Visit: 0   Vitals Temp: 98.1 F (36.7 C) BP: 121/72 Pulse Rate: 84 SpO2: 99 %   Anthropometric Measurements Height: 5' 4 (1.626 m) Weight: 152 lb (68.9 kg) BMI (Calculated): 26.08 Weight at Last Visit: 157lb Weight Lost Since Last Visit: 5lb Weight Gained Since Last Visit: 0 Starting Weight: 238lb Total Weight Loss (lbs): 86 lb (39 kg)   Body Composition  Body Fat %: 39.4 % Fat Mass (lbs): 60.2 lbs Muscle Mass (lbs): 87.6 lbs Total Body Water (lbs): 65.8 lbs Visceral Fat Rating : 9   Other Clinical Data Fasting: no Labs: no Today's Visit #: 37 Starting Date: 03/21/21    OBESITY Jacqueline Good is here to discuss her progress with her obesity treatment plan along with follow-up of her obesity related diagnoses.    Nutrition Plan: the Category 1 plan - 50% adherence.  Current exercise: none  Interim History:  She is down 5 lbs since her last visit. She is not sleeping well. Her mother is very sick.  Eating all of the food on the plan., Is skipping meals, Not journaling consistently., and Water intake is adequate.   Pharmacotherapy: Jacqueline Good is on Mounjaro  2.5 mg SQ weekly Adverse side effects: None Hunger is moderately controlled.  Cravings are moderately controlled.  Assessment/Plan:    Vitamin D  Deficiency Vitamin D  is not at goal of 50.  Most recent vitamin D  level was 31.6. She is on  prescription ergocalciferol  50,000 IU weekly. Lab Results  Component Value Date   VD25OH 31.6 08/31/2023   VD25OH 58.3 01/08/2023   VD25OH 33.2 11/27/2021    Plan: Refill prescription vitamin D  50,000 IU weekly.   Hypertension Hypertension well controlled.  Medication(s): Inderal  40 mg  BP Readings from Last 3 Encounters:  02/02/24 121/72  01/21/24 114/70   01/05/24 105/77   Lab Results  Component Value Date   CREATININE 0.98 01/21/2024   CREATININE 0.93 08/31/2023   CREATININE 0.95 01/13/2023   Plan: Continue all antihypertensives at current dosages. No added salt. Will keep sodium content to 1,500 mg or less per day.   Will resume exercise.  Will adhere to her plan at 85 to 95 %.     Generalized Obesity: Current BMI BMI (Calculated): 26.08   Pharmacotherapy Plan Continue  Mounjaro  2.5 mg SQ weekly (will get samples until she can afford medication).   Jacqueline Good is currently in the action stage of change. As such, her goal is to continue with weight loss efforts.  She has agreed to the Category 1 plan.  Exercise goals: All adults should avoid inactivity. Some physical activity is better than none, and adults who participate in any amount of physical activity gain some health benefits.  Behavioral modification strategies: increasing lean protein intake, decreasing simple carbohydrates , no meal  skipping, meal planning , increase water intake, better snacking choices, planning for success, increasing vegetables, avoiding temptations, keep healthy foods in the home, increase frequency of journaling, and mindful eating.  Jacqueline Good has agreed to follow-up with our clinic in 4 weeks.    Objective:   VITALS: Per patient if applicable, see vitals. GENERAL: Alert and in no acute distress. CARDIOPULMONARY: No increased WOB. Speaking in clear sentences.  PSYCH: Pleasant and cooperative. Speech normal rate and rhythm. Affect is appropriate. Insight and judgement are appropriate. Attention is focused, linear, and appropriate.  NEURO: Oriented as arrived to appointment on time with no prompting.   Attestation Statements:   This was prepared with the assistance of Engineer, civil (consulting).  Occasional wrong-word or sound-a-like substitutions may have occurred due to the inherent limitations of voice recognition

## 2024-02-03 ENCOUNTER — Telehealth: Payer: Self-pay | Admitting: *Deleted

## 2024-02-03 NOTE — Telephone Encounter (Signed)
  Attempted to contact patient x 3 using preferred # at provider's request with following results from [employer] test:  ctDNA NOT DETECTED  Unable to reach patient by phone: Received message that call cannot be completed at this time twice and no answer once.  Results sent via MyChart

## 2024-02-05 ENCOUNTER — Encounter: Payer: Self-pay | Admitting: Adult Health

## 2024-02-08 ENCOUNTER — Other Ambulatory Visit (HOSPITAL_COMMUNITY): Payer: Self-pay

## 2024-02-09 ENCOUNTER — Other Ambulatory Visit (HOSPITAL_COMMUNITY): Payer: Self-pay

## 2024-02-24 ENCOUNTER — Other Ambulatory Visit (HOSPITAL_COMMUNITY): Payer: Self-pay

## 2024-02-24 DIAGNOSIS — M549 Dorsalgia, unspecified: Secondary | ICD-10-CM | POA: Diagnosis not present

## 2024-02-24 DIAGNOSIS — Z79899 Other long term (current) drug therapy: Secondary | ICD-10-CM | POA: Diagnosis not present

## 2024-02-24 DIAGNOSIS — R030 Elevated blood-pressure reading, without diagnosis of hypertension: Secondary | ICD-10-CM | POA: Diagnosis not present

## 2024-02-24 DIAGNOSIS — G8929 Other chronic pain: Secondary | ICD-10-CM | POA: Diagnosis not present

## 2024-02-24 MED ORDER — OXYCODONE-ACETAMINOPHEN 10-325 MG PO TABS
1.0000 | ORAL_TABLET | Freq: Four times a day (QID) | ORAL | 0 refills | Status: DC | PRN
  Filled 2024-02-24: qty 120, 30d supply, fill #0

## 2024-02-26 DIAGNOSIS — Z79899 Other long term (current) drug therapy: Secondary | ICD-10-CM | POA: Diagnosis not present

## 2024-03-01 ENCOUNTER — Other Ambulatory Visit (HOSPITAL_COMMUNITY): Payer: Self-pay

## 2024-03-01 MED ORDER — ZOLPIDEM TARTRATE 10 MG PO TABS
10.0000 mg | ORAL_TABLET | Freq: Every day | ORAL | 1 refills | Status: AC
  Filled 2024-03-01: qty 30, 30d supply, fill #0
  Filled 2024-03-28: qty 30, 30d supply, fill #1

## 2024-03-01 MED ORDER — BUPROPION HCL ER (XL) 300 MG PO TB24
300.0000 mg | ORAL_TABLET | Freq: Every morning | ORAL | 1 refills | Status: AC
  Filled 2024-03-01: qty 30, 30d supply, fill #0

## 2024-03-01 MED ORDER — ALPRAZOLAM 1 MG PO TABS
2.5000 mg | ORAL_TABLET | Freq: Every day | ORAL | 1 refills | Status: AC
  Filled 2024-03-01: qty 75, 30d supply, fill #0
  Filled 2024-03-28: qty 75, 30d supply, fill #1

## 2024-03-01 MED ORDER — SERTRALINE HCL 100 MG PO TABS
200.0000 mg | ORAL_TABLET | Freq: Every morning | ORAL | 1 refills | Status: AC
  Filled 2024-03-01: qty 60, 30d supply, fill #0
  Filled 2024-03-28: qty 60, 30d supply, fill #1

## 2024-03-01 MED ORDER — LURASIDONE HCL 60 MG PO TABS
60.0000 mg | ORAL_TABLET | Freq: Every evening | ORAL | 1 refills | Status: AC
  Filled 2024-03-01: qty 30, 30d supply, fill #0
  Filled 2024-03-28: qty 30, 30d supply, fill #1

## 2024-03-02 ENCOUNTER — Ambulatory Visit (INDEPENDENT_AMBULATORY_CARE_PROVIDER_SITE_OTHER): Admitting: Bariatrics

## 2024-03-02 ENCOUNTER — Encounter: Payer: Self-pay | Admitting: Bariatrics

## 2024-03-02 ENCOUNTER — Other Ambulatory Visit (HOSPITAL_COMMUNITY): Payer: Self-pay

## 2024-03-02 VITALS — BP 108/74 | HR 65 | Temp 98.1°F | Ht 64.0 in | Wt 153.0 lb

## 2024-03-02 DIAGNOSIS — E669 Obesity, unspecified: Secondary | ICD-10-CM | POA: Diagnosis not present

## 2024-03-02 DIAGNOSIS — E7800 Pure hypercholesterolemia, unspecified: Secondary | ICD-10-CM

## 2024-03-02 DIAGNOSIS — Z6826 Body mass index (BMI) 26.0-26.9, adult: Secondary | ICD-10-CM

## 2024-03-02 DIAGNOSIS — R632 Polyphagia: Secondary | ICD-10-CM

## 2024-03-02 NOTE — Progress Notes (Signed)
 WEIGHT SUMMARY AND BIOMETRICS  Weight Lost Since Last Visit: 0  Weight Gained Since Last Visit: 1lb   Vitals Temp: 98.1 F (36.7 C) BP: 108/74 Pulse Rate: 65 SpO2: 97 %   Anthropometric Measurements Height: 5' 4 (1.626 m) Weight: 153 lb (69.4 kg) BMI (Calculated): 26.25 Weight at Last Visit: 152lb Weight Lost Since Last Visit: 0 Weight Gained Since Last Visit: 1lb Starting Weight: 238lb Total Weight Loss (lbs): 85 lb (38.6 kg)   Body Composition  Body Fat %: 31.4 % Fat Mass (lbs): 48.2 lbs Muscle Mass (lbs): 100.2 lbs Total Body Water (lbs): 64.6 lbs Visceral Fat Rating : 8   Other Clinical Data Fasting: no Labs: no Today's Visit #: 36 Starting Date: 03/21/21    OBESITY Jacqueline Good is here to discuss her progress with her obesity treatment plan along with follow-up of her obesity related diagnoses.    Nutrition Plan: the Category 1 plan - 50% adherence.  Current exercise: none  Interim History:  She is up 1 lb since her last visit.  She has had multiple stressors since her last visit and her mother died in the interim. Eating all of the food on the plan., Protein intake is as prescribed, Is skipping meals, and Water intake is inadequate.   Pharmacotherapy: Leaner is on Mounjaro  2.5 mg SQ weekly Sample given Mounjaro  2.5 mg Lot # [id number], Exp date: 07/21/2025.  Adverse side effects: None Hunger is moderately controlled.  Cravings are moderately controlled.  Assessment/Plan:   Polyphagia Jacqueline Good endorses excessive hunger.  Medication(s): Mounjaro   Effects of medication (appetite) :  moderately controlled. Cravings are moderately controlled.   Plan: Medication(s): Mounjaro  2.5 mg SQ weekly Will increase water, protein and fiber to help assuage hunger.  Will minimize foods that have a high glucose index/load to minimize reactive  hypoglycemia.  She will increase her water. She will have a protein shake if she does not eat. Discussed sleep hygiene. She is planning on attending grief counseling which was encouraged.  Elevated cholesterol:   She has a history of elevated cholesterol with both a total cholesterol and a LDL cholesterol levels that are higher than normal.  She also has an LDL/HDL ratio that is above normal.  She is not on a cholesterol-lowering agent.  Plan: She will continue the plan and exercise. Her PCP will follow her lipid panel.    Generalized Obesity: Current BMI BMI (Calculated): 26.25   Pharmacotherapy Plan Continue  Mounjaro  2.5 mg SQ weekly  Jacqueline Good is currently in the action stage of change. As such, her goal is to continue with weight loss efforts.  She has agreed to the Category 1 plan.  Exercise goals: For substantial health benefits, adults should do at least 150 minutes (2 hours and 30 minutes) a week of moderate-intensity, or 75 minutes (1 hour and 15 minutes) a week of vigorous-intensity aerobic physical  activity, or an equivalent combination of moderate- and vigorous-intensity aerobic activity. Aerobic activity should be performed in episodes of at least 10 minutes, and preferably, it should be spread throughout the week.  Behavioral modification strategies: increasing lean protein intake, no meal skipping, meal planning , increase water intake, better snacking choices, planning for success, increasing vegetables, increasing fiber rich foods, keep healthy foods in the home, measure portion sizes, and mindful eating.  Jacqueline Good has agreed to follow-up with our clinic in 4 weeks.       Objective:   VITALS: Per patient if applicable, see vitals. GENERAL: Alert and in no acute distress. CARDIOPULMONARY: No increased WOB. Speaking in clear sentences.  PSYCH: Pleasant and cooperative. Speech normal rate and rhythm. Affect is appropriate. Insight and judgement are appropriate.  Attention is focused, linear, and appropriate.  NEURO: Oriented as arrived to appointment on time with no prompting.   Attestation Statements:   This was prepared with the assistance of Engineer, civil (consulting).  Occasional wrong-word or sound-a-like substitutions may have occurred due to the inherent limitations of voice recognition   Clayborne Daring, DO

## 2024-03-10 DIAGNOSIS — G8929 Other chronic pain: Secondary | ICD-10-CM | POA: Diagnosis not present

## 2024-03-29 ENCOUNTER — Other Ambulatory Visit: Payer: Self-pay

## 2024-03-30 ENCOUNTER — Ambulatory Visit: Admitting: Bariatrics

## 2024-03-31 ENCOUNTER — Other Ambulatory Visit: Payer: Self-pay

## 2024-04-01 ENCOUNTER — Other Ambulatory Visit: Payer: Self-pay

## 2024-04-02 ENCOUNTER — Other Ambulatory Visit (HOSPITAL_COMMUNITY): Payer: Self-pay

## 2024-04-02 DIAGNOSIS — G8929 Other chronic pain: Secondary | ICD-10-CM | POA: Diagnosis not present

## 2024-04-02 DIAGNOSIS — R748 Abnormal levels of other serum enzymes: Secondary | ICD-10-CM | POA: Diagnosis not present

## 2024-04-02 DIAGNOSIS — M549 Dorsalgia, unspecified: Secondary | ICD-10-CM | POA: Diagnosis not present

## 2024-04-02 DIAGNOSIS — Z79899 Other long term (current) drug therapy: Secondary | ICD-10-CM | POA: Diagnosis not present

## 2024-04-02 MED ORDER — OXYCODONE-ACETAMINOPHEN 10-325 MG PO TABS
1.0000 | ORAL_TABLET | Freq: Four times a day (QID) | ORAL | 0 refills | Status: DC | PRN
  Filled 2024-04-02: qty 120, 30d supply, fill #0

## 2024-04-05 ENCOUNTER — Other Ambulatory Visit: Payer: Self-pay

## 2024-04-05 DIAGNOSIS — Z79899 Other long term (current) drug therapy: Secondary | ICD-10-CM | POA: Diagnosis not present

## 2024-04-06 ENCOUNTER — Ambulatory Visit: Admitting: Bariatrics

## 2024-04-07 ENCOUNTER — Ambulatory Visit (INDEPENDENT_AMBULATORY_CARE_PROVIDER_SITE_OTHER): Admitting: Bariatrics

## 2024-04-07 ENCOUNTER — Other Ambulatory Visit (HOSPITAL_COMMUNITY): Payer: Self-pay

## 2024-04-07 ENCOUNTER — Encounter: Payer: Self-pay | Admitting: Bariatrics

## 2024-04-07 DIAGNOSIS — E669 Obesity, unspecified: Secondary | ICD-10-CM | POA: Diagnosis not present

## 2024-04-07 DIAGNOSIS — R632 Polyphagia: Secondary | ICD-10-CM

## 2024-04-07 DIAGNOSIS — E88819 Insulin resistance, unspecified: Secondary | ICD-10-CM

## 2024-04-07 DIAGNOSIS — Z6826 Body mass index (BMI) 26.0-26.9, adult: Secondary | ICD-10-CM | POA: Diagnosis not present

## 2024-04-07 MED ORDER — METFORMIN HCL 500 MG PO TABS
500.0000 mg | ORAL_TABLET | Freq: Two times a day (BID) | ORAL | 0 refills | Status: DC
  Filled 2024-04-07 (×2): qty 60, 30d supply, fill #0

## 2024-04-07 MED ORDER — VITAMIN D (ERGOCALCIFEROL) 1.25 MG (50000 UNIT) PO CAPS
50000.0000 [IU] | ORAL_CAPSULE | ORAL | 0 refills | Status: AC
  Filled 2024-04-07: qty 6, 84d supply, fill #0

## 2024-04-07 NOTE — Progress Notes (Signed)
 WEIGHT SUMMARY AND BIOMETRICS  Weight Lost Since Last Visit: 0  Weight Gained Since Last Visit: 4lb   Vitals Temp: 98.2 F (36.8 C) BP: 121/79 Pulse Rate: (!) 59 SpO2: 99 %   Anthropometric Measurements Height: 5' 4 (1.626 m) Weight: 157 lb (71.2 kg) BMI (Calculated): 26.94 Weight at Last Visit: 153lb Weight Lost Since Last Visit: 0 Weight Gained Since Last Visit: 4lb Starting Weight: 238lb Total Weight Loss (lbs): 81 lb (36.7 kg)   Body Composition  Body Fat %: 40.9 % Fat Mass (lbs): 64.4 lbs Muscle Mass (lbs): 88.4 lbs Total Body Water (lbs): 65.4 lbs Visceral Fat Rating : 10   Other Clinical Data Fasting: no Labs: no Today's Visit #: 70 Starting Date: 03/21/21    OBESITY Jacqueline Good is here to discuss her progress with her obesity treatment plan along with follow-up of her obesity related diagnoses.    Nutrition Plan: the Category 1 plan - 50% adherence.  Current exercise: walking  Interim History:  She is up 4 lbs since her last visit.  Eating all of the food on the plan., Protein intake is as prescribed, Is skipping meals, Water intake is adequate., and Reports excessive cravings.   Pharmacotherapy: Jacqueline Good is on Mounjaro  2.5 mg SQ weekly Adverse side effects: None Hunger is moderately controlled.  Cravings are moderately controlled.  Assessment/Plan:   Polyphagia Jacqueline Good endorses excessive hunger.  Medication(s): Mounjaro   and Metformin   Effects of medication (Appetite):  moderately controlled. Cravings are moderately controlled.   Plan: Medication(s): Mounjaro  2.5 mg SQ weekly, Metformin  500 mg BID Will increase water, protein and fiber to help assuage hunger.  Will minimize foods that have a high glucose index/load to minimize reactive hypoglycemia.  She will have a protein shake if she does not eat. She will not skip meals.    We did discuss strategies for stress eating.   Vitamin D  Deficiency Vitamin D  is not at goal of 50.  Most recent vitamin D  level was 31.6. She is on  prescription ergocalciferol  50,000 IU weekly. Lab Results  Component Value Date   VD25OH 31.6 08/31/2023   VD25OH 58.3 01/08/2023   VD25OH 33.2 11/27/2021    Plan: Refill prescription vitamin D  50,000 IU weekly.     Generalized Obesity: Current BMI BMI (Calculated): 26.94   Pharmacotherapy Plan Continue  Mounjaro  2.5 mg SQ weekly (sample) Lot/Exp: [id number], 2025/10/04.    Jacqueline Good is currently in the action stage of change. As such, her goal is to continue with weight loss efforts.  She has agreed to the Category 1 plan.  Exercise goals: All adults should avoid inactivity. Some physical activity is better than none, and adults who participate in any amount of physical activity gain some health benefits. She has been walking periodically. She will get out her weights for her arms.   Behavioral modification strategies: increasing lean protein intake,  decreasing simple carbohydrates , no meal skipping, decrease eating out, meal planning , decrease liquid calories, planning for success, increasing vegetables, decrease snacking , keep healthy foods in the home, and measure portion sizes.  Jacqueline Good has agreed to follow-up with our clinic in 4 weeks.     Objective:   VITALS: Per patient if applicable, see vitals. GENERAL: Alert and in no acute distress. CARDIOPULMONARY: No increased WOB. Speaking in clear sentences.  PSYCH: Pleasant and cooperative. Speech normal rate and rhythm. Affect is appropriate. Insight and judgement are appropriate. Attention is focused, linear, and appropriate.  NEURO: Oriented as arrived to appointment on time with no prompting.   Attestation Statements:   This was prepared with the assistance of Engineer, civil (consulting).  Occasional wrong-word or sound-a-like substitutions may have occurred due to the  inherent limitations of voice recognition   Jacqueline Daring, DO

## 2024-04-12 ENCOUNTER — Other Ambulatory Visit (HOSPITAL_COMMUNITY): Payer: Self-pay

## 2024-04-12 MED ORDER — ALPRAZOLAM 1 MG PO TABS
1.0000 mg | ORAL_TABLET | Freq: Every morning | ORAL | 1 refills | Status: AC
  Filled 2024-04-12: qty 75, 30d supply, fill #0
  Filled 2024-04-26 – 2024-04-27 (×2): qty 75, 75d supply, fill #0
  Filled 2024-04-30: qty 75, 30d supply, fill #0
  Filled 2024-06-01 (×2): qty 75, 30d supply, fill #1

## 2024-04-12 MED ORDER — LURASIDONE HCL 60 MG PO TABS
60.0000 mg | ORAL_TABLET | Freq: Every evening | ORAL | 1 refills | Status: AC
  Filled 2024-04-12 – 2024-04-26 (×2): qty 30, 30d supply, fill #0
  Filled 2024-06-01 – 2024-06-23 (×2): qty 30, 30d supply, fill #1

## 2024-04-12 MED ORDER — SERTRALINE HCL 100 MG PO TABS
200.0000 mg | ORAL_TABLET | Freq: Every morning | ORAL | 1 refills | Status: DC
  Filled 2024-04-12 – 2024-04-26 (×2): qty 60, 30d supply, fill #0
  Filled 2024-06-01 (×2): qty 60, 30d supply, fill #1

## 2024-04-12 MED ORDER — ZOLPIDEM TARTRATE 10 MG PO TABS
10.0000 mg | ORAL_TABLET | Freq: Every day | ORAL | 1 refills | Status: DC
  Filled 2024-04-12 – 2024-04-30 (×4): qty 30, 30d supply, fill #0
  Filled 2024-06-01 (×2): qty 30, 30d supply, fill #1

## 2024-04-12 MED ORDER — BUPROPION HCL ER (XL) 300 MG PO TB24
300.0000 mg | ORAL_TABLET | Freq: Every morning | ORAL | 1 refills | Status: AC
  Filled 2024-04-12: qty 30, 30d supply, fill #0
  Filled 2024-06-01 (×2): qty 30, 30d supply, fill #1

## 2024-04-14 ENCOUNTER — Other Ambulatory Visit (HOSPITAL_COMMUNITY): Payer: Self-pay

## 2024-04-14 ENCOUNTER — Other Ambulatory Visit: Payer: Self-pay | Admitting: Bariatrics

## 2024-04-14 DIAGNOSIS — R632 Polyphagia: Secondary | ICD-10-CM

## 2024-04-14 DIAGNOSIS — E88819 Insulin resistance, unspecified: Secondary | ICD-10-CM

## 2024-04-14 DIAGNOSIS — E669 Obesity, unspecified: Secondary | ICD-10-CM

## 2024-04-22 ENCOUNTER — Other Ambulatory Visit (HOSPITAL_COMMUNITY): Payer: Self-pay

## 2024-04-22 ENCOUNTER — Ambulatory Visit
Admission: RE | Admit: 2024-04-22 | Discharge: 2024-04-22 | Disposition: A | Discharge status: Home / Self Care | Source: Ambulatory Visit | Attending: Physician Assistant | Admitting: Physician Assistant

## 2024-04-22 DIAGNOSIS — Z87891 Personal history of nicotine dependence: Secondary | ICD-10-CM | POA: Diagnosis not present

## 2024-04-22 DIAGNOSIS — J432 Centrilobular emphysema: Secondary | ICD-10-CM

## 2024-04-26 ENCOUNTER — Other Ambulatory Visit: Payer: Self-pay

## 2024-04-26 ENCOUNTER — Other Ambulatory Visit (HOSPITAL_COMMUNITY): Payer: Self-pay

## 2024-04-27 ENCOUNTER — Other Ambulatory Visit (HOSPITAL_COMMUNITY): Payer: Self-pay

## 2024-04-29 ENCOUNTER — Other Ambulatory Visit: Payer: Self-pay

## 2024-04-30 ENCOUNTER — Other Ambulatory Visit (HOSPITAL_COMMUNITY): Payer: Self-pay

## 2024-05-02 ENCOUNTER — Other Ambulatory Visit (HOSPITAL_COMMUNITY): Payer: Self-pay

## 2024-05-02 DIAGNOSIS — Z79899 Other long term (current) drug therapy: Secondary | ICD-10-CM | POA: Diagnosis not present

## 2024-05-02 DIAGNOSIS — M549 Dorsalgia, unspecified: Secondary | ICD-10-CM | POA: Diagnosis not present

## 2024-05-02 DIAGNOSIS — G8929 Other chronic pain: Secondary | ICD-10-CM | POA: Diagnosis not present

## 2024-05-02 DIAGNOSIS — R748 Abnormal levels of other serum enzymes: Secondary | ICD-10-CM | POA: Diagnosis not present

## 2024-05-02 MED ORDER — OXYCODONE-ACETAMINOPHEN 10-325 MG PO TABS
1.0000 | ORAL_TABLET | Freq: Four times a day (QID) | ORAL | 0 refills | Status: DC | PRN
  Filled 2024-05-02: qty 120, 30d supply, fill #0

## 2024-05-04 DIAGNOSIS — Z79899 Other long term (current) drug therapy: Secondary | ICD-10-CM | POA: Diagnosis not present

## 2024-05-06 ENCOUNTER — Other Ambulatory Visit (HOSPITAL_COMMUNITY): Payer: Self-pay

## 2024-05-06 ENCOUNTER — Other Ambulatory Visit: Payer: Self-pay

## 2024-05-06 MED ORDER — GUAIFENESIN-CODEINE 100-10 MG/5ML PO SOLN
ORAL | 0 refills | Status: AC
  Filled 2024-05-06: qty 280, 7d supply, fill #0

## 2024-05-09 ENCOUNTER — Other Ambulatory Visit (HOSPITAL_COMMUNITY): Payer: Self-pay

## 2024-05-09 ENCOUNTER — Ambulatory Visit: Admitting: Bariatrics

## 2024-05-09 ENCOUNTER — Encounter: Payer: Self-pay | Admitting: Bariatrics

## 2024-05-09 ENCOUNTER — Other Ambulatory Visit: Payer: Self-pay

## 2024-05-09 VITALS — BP 125/84 | HR 65 | Temp 97.8°F | Ht 64.0 in | Wt 158.0 lb

## 2024-05-09 DIAGNOSIS — R632 Polyphagia: Secondary | ICD-10-CM | POA: Diagnosis not present

## 2024-05-09 DIAGNOSIS — Z6827 Body mass index (BMI) 27.0-27.9, adult: Secondary | ICD-10-CM

## 2024-05-09 DIAGNOSIS — E88819 Insulin resistance, unspecified: Secondary | ICD-10-CM

## 2024-05-09 DIAGNOSIS — E669 Obesity, unspecified: Secondary | ICD-10-CM

## 2024-05-09 DIAGNOSIS — I10 Essential (primary) hypertension: Secondary | ICD-10-CM

## 2024-05-09 MED ORDER — METFORMIN HCL 500 MG PO TABS
500.0000 mg | ORAL_TABLET | Freq: Two times a day (BID) | ORAL | 0 refills | Status: DC
Start: 2024-05-09 — End: 2024-06-29
  Filled 2024-05-09: qty 60, 30d supply, fill #0

## 2024-05-09 MED ORDER — PROMETHAZINE HCL 6.25 MG/5ML PO SOLN
ORAL | 0 refills | Status: AC
  Filled 2024-05-09: qty 280, 7d supply, fill #0

## 2024-05-09 NOTE — Progress Notes (Signed)
 WEIGHT SUMMARY AND BIOMETRICS  Weight Lost Since Last Visit: 0  Weight Gained Since Last Visit: 1lb   Vitals Temp: 97.8 F (36.6 C) BP: 125/84 Pulse Rate: 65 SpO2: 100 %   Anthropometric Measurements Height: 5' 4 (1.626 m) Weight: 158 lb (71.7 kg) BMI (Calculated): 27.11 Weight at Last Visit: 157lb Weight Lost Since Last Visit: 0 Weight Gained Since Last Visit: 1lb Starting Weight: 238lb Total Weight Loss (lbs): 80 lb (36.3 kg)   Body Composition  Body Fat %: 41 % Fat Mass (lbs): 65 lbs Muscle Mass (lbs): 88.6 lbs Total Body Water (lbs): 65.6 lbs Visceral Fat Rating : 10   Other Clinical Data Fasting: no Labs: no Today's Visit #: 40 Starting Date: 03/21/21    OBESITY Jacqueline Good is here to discuss her progress with her obesity treatment plan along with follow-up of her obesity related diagnoses.    Nutrition Plan: the Category 1 plan - 50% adherence.  Current exercise: walking  Interim History:  She is up 1 lb since her last visit. She has been under a lot of stress secondary to deaths in the family.  Eating all of the food on the plan., Protein intake is as prescribed, Is not skipping meals, Not journaling consistently., and Water intake is adequate.   Pharmacotherapy: Jacqueline Good is on Mounjaro  2.5 mg SQ weekly Adverse side effects: None Hunger is moderately controlled.  Cravings are moderately controlled.  Assessment/Plan:    Polyphagia Jacqueline Good endorses excessive hunger.  Medication(s): Monjaro Effects of medication:  moderately controlled. Cravings are moderately controlled.   Plan: Medication(s): Mounjaro  2.5 mg SQ weekly Will increase water, protein and fiber to help assuage hunger.  Will minimize foods that have a high glucose index/load to minimize reactive hypoglycemia.   Hypertension Hypertension well controlled.   Medication(s): Inderal   40 mg  BP Readings from Last 3 Encounters:  05/09/24 125/84  04/07/24 121/79  03/02/24 108/74   Lab Results  Component Value Date   CREATININE 0.98 01/21/2024   CREATININE 0.93 08/31/2023   CREATININE 0.95 01/13/2023   No results found for: GFR  Plan: Continue all antihypertensives at current dosages. No added salt. Will keep sodium content to 1,500 mg or less per day.   Will continue her plan and exercise.     Generalized Obesity: Current BMI BMI (Calculated): 27.11   Pharmacotherapy Plan Continue and refill  Mounjaro  2.5 mg SQ weekly  (sample Lot/Exp [id number] C, 2027, Feb, 24)  Jacqueline Good is currently in the action stage of change. As such, her goal is to continue with weight loss efforts.  She has agreed to the Category 1 plan.  Exercise goals: All adults should avoid inactivity. Some physical activity is better than none, and adults who participate in any amount of physical activity gain some health benefits.  Behavioral modification strategies: increasing lean protein intake, no meal skipping, meal planning ,  increase water intake, better snacking choices, planning for success, increasing vegetables, avoiding temptations, weigh protein portions, measure portion sizes, and mindful eating.  Jacqueline Good has agreed to follow-up with our clinic in 4 weeks.    Objective:   VITALS: Per patient if applicable, see vitals. GENERAL: Alert and in no acute distress. CARDIOPULMONARY: No increased WOB. Speaking in clear sentences.  PSYCH: Pleasant and cooperative. Speech normal rate and rhythm. Affect is appropriate. Insight and judgement are appropriate. Attention is focused, linear, and appropriate.  NEURO: Oriented as arrived to appointment on time with no prompting.   Attestation Statements:   This was prepared with the assistance of Engineer, civil (consulting).  Occasional wrong-word or sound-a-like substitutions may have occurred due to the inherent  limitations of voice recognition   Clayborne Daring, DO

## 2024-05-16 ENCOUNTER — Other Ambulatory Visit (HOSPITAL_COMMUNITY): Payer: Self-pay

## 2024-05-18 DIAGNOSIS — Z0000 Encounter for general adult medical examination without abnormal findings: Secondary | ICD-10-CM | POA: Diagnosis not present

## 2024-05-18 DIAGNOSIS — Z23 Encounter for immunization: Secondary | ICD-10-CM | POA: Diagnosis not present

## 2024-05-25 ENCOUNTER — Other Ambulatory Visit (HOSPITAL_COMMUNITY): Payer: Self-pay

## 2024-05-25 DIAGNOSIS — Z79899 Other long term (current) drug therapy: Secondary | ICD-10-CM | POA: Diagnosis not present

## 2024-05-25 DIAGNOSIS — M549 Dorsalgia, unspecified: Secondary | ICD-10-CM | POA: Diagnosis not present

## 2024-05-25 DIAGNOSIS — R748 Abnormal levels of other serum enzymes: Secondary | ICD-10-CM | POA: Diagnosis not present

## 2024-05-25 DIAGNOSIS — G8929 Other chronic pain: Secondary | ICD-10-CM | POA: Diagnosis not present

## 2024-05-25 MED ORDER — OXYCODONE-ACETAMINOPHEN 10-325 MG PO TABS
1.0000 | ORAL_TABLET | Freq: Four times a day (QID) | ORAL | 0 refills | Status: DC | PRN
  Filled 2024-05-25 – 2024-05-31 (×2): qty 120, 30d supply, fill #0

## 2024-05-27 DIAGNOSIS — Z79899 Other long term (current) drug therapy: Secondary | ICD-10-CM | POA: Diagnosis not present

## 2024-05-31 ENCOUNTER — Other Ambulatory Visit (HOSPITAL_COMMUNITY): Payer: Self-pay

## 2024-06-01 ENCOUNTER — Other Ambulatory Visit (HOSPITAL_COMMUNITY): Payer: Self-pay

## 2024-06-01 ENCOUNTER — Other Ambulatory Visit: Payer: Self-pay

## 2024-06-07 ENCOUNTER — Ambulatory Visit: Admitting: Bariatrics

## 2024-06-09 ENCOUNTER — Other Ambulatory Visit (HOSPITAL_COMMUNITY): Payer: Self-pay

## 2024-06-09 DIAGNOSIS — M549 Dorsalgia, unspecified: Secondary | ICD-10-CM | POA: Diagnosis not present

## 2024-06-09 DIAGNOSIS — R030 Elevated blood-pressure reading, without diagnosis of hypertension: Secondary | ICD-10-CM | POA: Diagnosis not present

## 2024-06-09 DIAGNOSIS — Z79899 Other long term (current) drug therapy: Secondary | ICD-10-CM | POA: Diagnosis not present

## 2024-06-09 DIAGNOSIS — G8929 Other chronic pain: Secondary | ICD-10-CM | POA: Diagnosis not present

## 2024-06-09 MED ORDER — OXYCODONE-ACETAMINOPHEN 10-325 MG PO TABS: 1.0000 | ORAL_TABLET | Freq: Four times a day (QID) | ORAL | 0 refills | Status: AC | PRN

## 2024-06-13 ENCOUNTER — Other Ambulatory Visit (HOSPITAL_COMMUNITY): Payer: Self-pay

## 2024-06-21 ENCOUNTER — Other Ambulatory Visit (HOSPITAL_COMMUNITY): Payer: Self-pay

## 2024-06-21 MED ORDER — OXYCODONE-ACETAMINOPHEN 10-325 MG PO TABS
1.0000 | ORAL_TABLET | Freq: Four times a day (QID) | ORAL | 0 refills | Status: AC | PRN
  Filled 2024-06-21 – 2024-06-29 (×2): qty 120, 30d supply, fill #0

## 2024-06-23 ENCOUNTER — Other Ambulatory Visit (HOSPITAL_COMMUNITY): Payer: Self-pay

## 2024-06-29 ENCOUNTER — Other Ambulatory Visit: Payer: Self-pay

## 2024-06-29 ENCOUNTER — Encounter: Payer: Self-pay | Admitting: Bariatrics

## 2024-06-29 ENCOUNTER — Other Ambulatory Visit (HOSPITAL_COMMUNITY): Payer: Self-pay

## 2024-06-29 ENCOUNTER — Ambulatory Visit: Admitting: Bariatrics

## 2024-06-29 VITALS — BP 131/82 | HR 72 | Ht 64.0 in | Wt 167.0 lb

## 2024-06-29 DIAGNOSIS — E88819 Insulin resistance, unspecified: Secondary | ICD-10-CM | POA: Diagnosis not present

## 2024-06-29 DIAGNOSIS — F5089 Other specified eating disorder: Secondary | ICD-10-CM | POA: Diagnosis not present

## 2024-06-29 DIAGNOSIS — E669 Obesity, unspecified: Secondary | ICD-10-CM

## 2024-06-29 DIAGNOSIS — R632 Polyphagia: Secondary | ICD-10-CM

## 2024-06-29 DIAGNOSIS — Z6828 Body mass index (BMI) 28.0-28.9, adult: Secondary | ICD-10-CM

## 2024-06-29 MED ORDER — BUPROPION HCL ER (SR) 150 MG PO TB12
150.0000 mg | ORAL_TABLET | Freq: Every day | ORAL | 0 refills | Status: AC
  Filled 2024-06-29 (×2): qty 30, 30d supply, fill #0

## 2024-06-29 MED ORDER — METFORMIN HCL 500 MG PO TABS
500.0000 mg | ORAL_TABLET | Freq: Two times a day (BID) | ORAL | 0 refills | Status: DC
  Filled 2024-06-29 (×2): qty 60, 30d supply, fill #0

## 2024-06-29 NOTE — Progress Notes (Signed)
 WEIGHT SUMMARY AND BIOMETRICS  Weight Lost Since Last Visit: 0  Weight Gained Since Last Visit: 9lb   Vitals BP: 131/82 Pulse Rate: 72 SpO2: 100 %   Anthropometric Measurements Height: 5' 4 (1.626 m) Weight: 167 lb (75.8 kg) BMI (Calculated): 28.65 Weight at Last Visit: 158lb Weight Lost Since Last Visit: 0 Weight Gained Since Last Visit: 9lb Starting Weight: 238lb Total Weight Loss (lbs): 71 lb (32.2 kg)   Body Composition  Body Fat %: 42.3 % Fat Mass (lbs): 70.8 lbs Muscle Mass (lbs): 91.6 lbs Total Body Water (lbs): 66 lbs Visceral Fat Rating : 11   Other Clinical Data Fasting: no Labs: no Today's Visit #: 42 Starting Date: 03/21/21    OBESITY Devora is here to discuss her progress with her obesity treatment plan along with follow-up of her obesity related diagnoses.    Nutrition Plan: the Category 1 plan - 50% adherence.  Current exercise: weightlifting  Interim History:  She is up 9 lbs since her last visit. She has been doing more stress eating.  She states that her father died since the last time she was here.  She is also endured the death of her mother and her brother this year which makes this time especially difficult.  She also has not been exercising as much. Eating all of the food on the plan., Protein intake is as prescribed, Is not skipping meals, and Water intake is adequate.   Pharmacotherapy: Adilyn is on Mounjaro  2.5 mg SQ weekly Adverse side effects: None Hunger is moderately controlled.  Cravings are poorly controlled.  Assessment/Plan:   Polyphagia Shawana endorses excessive hunger.  Medication(s): Mounjaro  2.5 mg subcu weekly Effects of medication:  moderately controlled. Cravings are poorly controlled.   Plan: Medication(s): Mounjaro  2.5 mg SQ weekly Will increase water, protein and fiber to help assuage  hunger.  Will minimize foods that have a high glucose index/load to minimize reactive hypoglycemia.   Insulin  Resistance Sharni has had elevated fasting insulin  readings. Goal is HgbA1c < 5.7, fasting insulin  at l0 or less, and preferably at 5.  She reports some polyphagia. Medication(s): Mounjaro   Lab Results  Component Value Date   HGBA1C 5.3 01/08/2023   Lab Results  Component Value Date   INSULIN  4.9 08/31/2023   INSULIN  9.2 01/08/2023   INSULIN  11.7 11/27/2021   INSULIN  31.4 (H) 03/21/2021    Plan Medication(s): Mounjaro  2.5 mg SQ weekly (sample) Lot/Exp: [id number] C,02/04/2026.  Will work on the agreed upon plan. Will minimize refined carbohydrates ( sweets and starches), and focus more on complex carbohydrates.  Increase the micronutrients found in leafy greens, which include magnesium , polyphenols, and vitamin C which have been postulated to help with insulin  sensitivity. Minimize fast food and cook more meals at home.  Will cook more at home.   Eating disorder/emotional eating Zaleah has had issues with stress eating and  emotional eating. Currently this is moderately controlled. Overall mood is stable. Denies suicidal/homicidal ideation. Medication(s): Wellbutrin  150 mg daily in the am  Plan:  Specifically regarding patient's less desirable eating habits and patterns, we employed the technique of small changes when she cannot fully commit to her prudent nutritional plan. Medication(s): Wellbutrin  150 mg daily in the am #30 with no refills.  Discussed distractions to curb eating behaviors. Discussed activities to do with one's hands in the evening  Be sure to get adequate rest as lack of rest can trigger appetite.  Have plan in place for stressful events.  Consider other rewards besides food.   Will go back to grief counseling.   Discussed ways to foster joy during the holidays.    Generalized Obesity: Current BMI BMI (Calculated): 28.65   Pharmacotherapy  Plan Continue  Mounjaro  2.5 mg SQ weekly  Elspeth is currently in the action stage of change. As such, her goal is to continue with weight loss efforts.  She has agreed to the Category 1 plan.  Exercise goals: All adults should avoid inactivity. Some physical activity is better than none, and adults who participate in any amount of physical activity gain some health benefits. She will continue to walk and will add in some weights.   Behavioral modification strategies: increasing lean protein intake, decreasing simple carbohydrates , no meal skipping, increase water intake, increasing vegetables, decrease junk food, weigh protein portions, measure portion sizes, and work on smaller portions.  Janei has agreed to follow-up with our clinic in 4 weeks.    Objective:   VITALS: Per patient if applicable, see vitals. GENERAL: Alert and in no acute distress. CARDIOPULMONARY: No increased WOB. Speaking in clear sentences.  PSYCH: Pleasant and cooperative. Speech normal rate and rhythm. Affect is appropriate. Insight and judgement are appropriate. Attention is focused, linear, and appropriate.  NEURO: Oriented as arrived to appointment on time with no prompting.   Attestation Statements:   This was prepared with the assistance of Engineer, Civil (consulting).  Occasional wrong-word or sound-a-like substitutions may have occurred due to the inherent limitations of voice recognition.  Clayborne Daring, DO

## 2024-07-22 ENCOUNTER — Other Ambulatory Visit (HOSPITAL_COMMUNITY): Payer: Self-pay

## 2024-07-22 MED ORDER — PREDNISONE 50 MG PO TABS
50.0000 mg | ORAL_TABLET | Freq: Every day | ORAL | 0 refills | Status: DC
  Filled 2024-07-22: qty 5, 5d supply, fill #0

## 2024-07-22 MED ORDER — DOXYCYCLINE HYCLATE 100 MG PO TABS
100.0000 mg | ORAL_TABLET | Freq: Two times a day (BID) | ORAL | 0 refills | Status: DC
  Filled 2024-07-22: qty 20, 10d supply, fill #0

## 2024-07-26 ENCOUNTER — Other Ambulatory Visit (HOSPITAL_COMMUNITY): Payer: Self-pay

## 2024-07-26 ENCOUNTER — Encounter: Payer: Self-pay | Admitting: Adult Health

## 2024-07-27 ENCOUNTER — Ambulatory Visit: Admitting: Bariatrics

## 2024-07-27 ENCOUNTER — Other Ambulatory Visit (HOSPITAL_COMMUNITY): Payer: Self-pay

## 2024-07-27 MED ORDER — ALBUTEROL SULFATE HFA 108 (90 BASE) MCG/ACT IN AERS
INHALATION_SPRAY | RESPIRATORY_TRACT | 3 refills | Status: AC
  Filled 2024-07-27: qty 6.7, 30d supply, fill #0

## 2024-07-27 MED ORDER — FLUTICASONE-SALMETEROL 100-50 MCG/ACT IN AEPB
1.0000 | INHALATION_SPRAY | Freq: Two times a day (BID) | RESPIRATORY_TRACT | 3 refills | Status: DC
  Filled 2024-07-27: qty 60, 30d supply, fill #0

## 2024-07-28 MED ORDER — OXYCODONE-ACETAMINOPHEN 10-325 MG PO TABS
1.0000 | ORAL_TABLET | Freq: Four times a day (QID) | ORAL | 0 refills | Status: AC | PRN
  Filled 2024-07-28: qty 120, 30d supply, fill #0

## 2024-08-01 ENCOUNTER — Other Ambulatory Visit (HOSPITAL_COMMUNITY): Payer: Self-pay

## 2024-08-02 ENCOUNTER — Other Ambulatory Visit (HOSPITAL_COMMUNITY): Payer: Self-pay

## 2024-08-02 MED ORDER — SERTRALINE HCL 100 MG PO TABS
200.0000 mg | ORAL_TABLET | Freq: Every morning | ORAL | 0 refills | Status: DC
  Filled 2024-08-02: qty 60, 30d supply, fill #0

## 2024-08-02 MED ORDER — ALPRAZOLAM 1 MG PO TABS
ORAL_TABLET | ORAL | 0 refills | Status: AC
  Filled 2024-08-02: qty 75, 30d supply, fill #0

## 2024-08-02 MED ORDER — ZOLPIDEM TARTRATE 10 MG PO TABS
10.0000 mg | ORAL_TABLET | Freq: Every day | ORAL | 0 refills | Status: DC
  Filled 2024-08-02: qty 30, 30d supply, fill #0

## 2024-08-03 ENCOUNTER — Ambulatory Visit: Admitting: Bariatrics

## 2024-08-03 ENCOUNTER — Encounter: Payer: Self-pay | Admitting: Bariatrics

## 2024-08-03 ENCOUNTER — Other Ambulatory Visit (HOSPITAL_COMMUNITY): Payer: Self-pay

## 2024-08-03 VITALS — BP 119/78 | HR 72 | Temp 97.8°F | Ht 64.0 in | Wt 168.0 lb

## 2024-08-03 DIAGNOSIS — F5089 Other specified eating disorder: Secondary | ICD-10-CM | POA: Diagnosis not present

## 2024-08-03 DIAGNOSIS — E669 Obesity, unspecified: Secondary | ICD-10-CM | POA: Diagnosis not present

## 2024-08-03 DIAGNOSIS — E88819 Insulin resistance, unspecified: Secondary | ICD-10-CM

## 2024-08-03 DIAGNOSIS — Z6828 Body mass index (BMI) 28.0-28.9, adult: Secondary | ICD-10-CM | POA: Diagnosis not present

## 2024-08-03 DIAGNOSIS — R632 Polyphagia: Secondary | ICD-10-CM | POA: Diagnosis not present

## 2024-08-03 MED ORDER — BUPROPION HCL ER (XL) 300 MG PO TB24
300.0000 mg | ORAL_TABLET | Freq: Every morning | ORAL | 1 refills | Status: AC
  Filled 2024-08-03: qty 30, 30d supply, fill #0

## 2024-08-03 MED ORDER — METFORMIN HCL 500 MG PO TABS
500.0000 mg | ORAL_TABLET | Freq: Two times a day (BID) | ORAL | 0 refills | Status: AC
  Filled 2024-08-03: qty 60, 30d supply, fill #0

## 2024-08-03 NOTE — Progress Notes (Signed)
 "                                                                                                             WEIGHT SUMMARY AND BIOMETRICS  Weight Lost Since Last Visit: 0lb  Weight Gained Since Last Visit: 1lb   Vitals Temp: 97.8 F (36.6 C) BP: 119/78 Pulse Rate: 72 SpO2: 97 %   Anthropometric Measurements Height: 5' 4 (1.626 m) Weight: 168 lb (76.2 kg) BMI (Calculated): 28.82 Weight at Last Visit: 167lb Weight Lost Since Last Visit: 0lb Weight Gained Since Last Visit: 1lb Starting Weight: 238lb Total Weight Loss (lbs): 70 lb (31.8 kg)   Body Composition  Body Fat %: 43.4 % Fat Mass (lbs): 73 lbs Muscle Mass (lbs): 90.2 lbs Total Body Water (lbs): 67.2 lbs Visceral Fat Rating : 11   Other Clinical Data Fasting: no Labs: no Today's Visit #: 51 Starting Date: 03/21/21    OBESITY Jacqueline Good is here to discuss her progress with her obesity treatment plan along with follow-up of her obesity related diagnoses.    Nutrition Plan: the Category 1 plan - 50% adherence.  Current exercise: weightlifting  Interim History:  She is up 1 lb since her last visit. She has been under a lot of stress and has had multiple deaths in the family this year.  Eating all of the food on the plan., Protein intake is as prescribed, Is not skipping meals, and Water intake is adequate.   Pharmacotherapy: Jacqueline Good is on Mounjaro  2.5 mg SQ weekly, Metformin  500 mg BID Adverse side effects: None Hunger is moderately controlled.  Cravings are moderately controlled.  Assessment/Plan:   Polyphagia Jacqueline Good endorses excessive hunger.  Medication(s): Mounjaro  2.5 mg Effects of medication:  moderately controlled. Cravings are moderately controlled.   Plan: Medication(s): Mounjaro  2.5 mg SQ weekly Will increase water, protein and fiber to help assuage hunger.  Will minimize foods that have a high glucose index/load to minimize reactive hypoglycemia.   Eating disorder/emotional  eating Jacqueline Good has had issues with stress eating, emotional eating, and boredom eating. Currently this is moderately controlled. Overall mood is stable. Denies suicidal/homicidal ideation. Medication(s): Wellbutrin  150 mg daily in the am  Plan:  Motivational interviewing as well as evidence-based interventions for health behavior change were utilized today including the discussion of self monitoring techniques, problem-solving barriers and SMART goal setting techniques.  Medication(s): Wellbutrin  150 mg daily in the am Discussed distractions to curb eating behaviors. Discussed activities to do with one's hands in the evening  Be sure to get adequate rest as lack of rest can trigger appetite.  Have plan in place for stressful events.  Consider other rewards besides food.     Generalized Obesity: Current BMI BMI (Calculated): 28.82   Insulin  Resistance Jacqueline Good has had elevated fasting insulin  readings. Goal is HgbA1c < 5.7, fasting insulin  at l0 or less, and preferably at 5.  She reports some mild polyphagia. Medication(s): Mounjaro   Lab Results  Component Value Date   HGBA1C 5.3 01/08/2023   Lab Results  Component Value  Date   INSULIN  4.9 08/31/2023   INSULIN  9.2 01/08/2023   INSULIN  11.7 11/27/2021   INSULIN  31.4 (H) 03/21/2021    Plan Medication(s): Mounjaro  2.5 mg SQ weekly ( sample Lot/Exp: [id number] C, January 24, 2026).  Will work on the agreed upon plan. Will minimize refined carbohydrates ( sweets and starches), and focus more on complex carbohydrates.  Increase the micronutrients found in leafy greens, which include magnesium , polyphenols, and vitamin C which have been postulated to help with insulin  sensitivity. Minimize fast food and cook more meals at home.  Increase fiber to 25 to 30 grams daily.  She will increase her exercise and will add in some weights.    Pharmacotherapy Plan Continue and refill  Mounjaro  2.5 mg SQ weekly (sample, see above).   Jacqueline Good is  currently in the action stage of change. As such, her goal is to continue with weight loss efforts.  She has agreed to the Category 1 plan.  Exercise goals: All adults should avoid inactivity. Some physical activity is better than none, and adults who participate in any amount of physical activity gain some health benefits.  Behavioral modification strategies: increasing lean protein intake, decreasing simple carbohydrates , no meal skipping, meal planning , better snacking choices, planning for success, increasing vegetables, increasing lower sugar fruits, increasing fiber rich foods, keep healthy foods in the home, weigh protein portions, measure portion sizes, and mindful eating.  Jacqueline Good has agreed to follow-up with our clinic in 4 weeks.       Objective:   VITALS: Per patient if applicable, see vitals. GENERAL: Alert and in no acute distress. CARDIOPULMONARY: No increased WOB. Speaking in clear sentences.  PSYCH: Pleasant and cooperative. Speech normal rate and rhythm. Affect is appropriate. Insight and judgement are appropriate. Attention is focused, linear, and appropriate.  NEURO: Oriented as arrived to appointment on time with no prompting.   Attestation Statements:   This was prepared with the assistance of Engineer, Civil (consulting).  Occasional wrong-word or sound-a-like substitutions may have occurred due to the inherent limitations of voice recognition.   Clayborne Daring, DO       "

## 2024-08-05 ENCOUNTER — Other Ambulatory Visit (HOSPITAL_COMMUNITY): Payer: Self-pay

## 2024-08-08 ENCOUNTER — Other Ambulatory Visit (HOSPITAL_COMMUNITY): Payer: Self-pay

## 2024-08-08 MED ORDER — PREDNISONE 50 MG PO TABS
50.0000 mg | ORAL_TABLET | Freq: Every day | ORAL | 0 refills | Status: AC
  Filled 2024-08-08: qty 5, 5d supply, fill #0

## 2024-08-08 MED ORDER — ESOMEPRAZOLE MAGNESIUM 40 MG PO CPDR
40.0000 mg | DELAYED_RELEASE_CAPSULE | Freq: Every day | ORAL | 3 refills | Status: DC | PRN
  Filled 2024-08-08: qty 30, 30d supply, fill #0

## 2024-08-23 ENCOUNTER — Other Ambulatory Visit: Payer: Self-pay | Admitting: Physician Assistant

## 2024-08-23 DIAGNOSIS — N644 Mastodynia: Secondary | ICD-10-CM

## 2024-08-30 ENCOUNTER — Other Ambulatory Visit (HOSPITAL_COMMUNITY): Payer: Self-pay

## 2024-08-31 ENCOUNTER — Other Ambulatory Visit: Payer: Self-pay

## 2024-08-31 ENCOUNTER — Other Ambulatory Visit (HOSPITAL_COMMUNITY): Payer: Self-pay

## 2024-08-31 ENCOUNTER — Ambulatory Visit: Admitting: Bariatrics

## 2024-08-31 MED ORDER — OXYCODONE-ACETAMINOPHEN 10-325 MG PO TABS
1.0000 | ORAL_TABLET | Freq: Four times a day (QID) | ORAL | 0 refills | Status: AC | PRN
  Filled 2024-08-31: qty 120, 30d supply, fill #0

## 2024-09-01 ENCOUNTER — Other Ambulatory Visit (HOSPITAL_COMMUNITY): Payer: Self-pay

## 2024-09-01 MED ORDER — ZOLPIDEM TARTRATE 10 MG PO TABS
10.0000 mg | ORAL_TABLET | Freq: Every evening | ORAL | 0 refills | Status: AC
  Filled 2024-09-01: qty 30, 30d supply, fill #0

## 2024-09-01 MED ORDER — SERTRALINE HCL 100 MG PO TABS
ORAL_TABLET | ORAL | 0 refills | Status: AC
  Filled 2024-09-01: qty 60, 30d supply, fill #0

## 2024-09-01 MED ORDER — ALPRAZOLAM 1 MG PO TABS
ORAL_TABLET | ORAL | 0 refills | Status: AC
  Filled 2024-09-01: qty 75, 30d supply, fill #0

## 2024-09-02 ENCOUNTER — Other Ambulatory Visit: Payer: Self-pay

## 2024-09-02 ENCOUNTER — Other Ambulatory Visit (HOSPITAL_COMMUNITY): Payer: Self-pay

## 2024-09-14 ENCOUNTER — Ambulatory Visit

## 2024-09-14 ENCOUNTER — Ambulatory Visit: Admitting: Internal Medicine

## 2024-09-14 ENCOUNTER — Other Ambulatory Visit (HOSPITAL_COMMUNITY): Payer: Self-pay

## 2024-09-14 VITALS — BP 116/72 | HR 79 | Temp 97.1°F | Ht 64.0 in | Wt 176.0 lb

## 2024-09-14 DIAGNOSIS — J439 Emphysema, unspecified: Secondary | ICD-10-CM

## 2024-09-14 DIAGNOSIS — F411 Generalized anxiety disorder: Secondary | ICD-10-CM | POA: Diagnosis not present

## 2024-09-14 DIAGNOSIS — Z87891 Personal history of nicotine dependence: Secondary | ICD-10-CM | POA: Diagnosis not present

## 2024-09-14 DIAGNOSIS — J45998 Other asthma: Secondary | ICD-10-CM

## 2024-09-14 MED ORDER — IPRATROPIUM-ALBUTEROL 0.5-2.5 (3) MG/3ML IN SOLN
3.0000 mL | Freq: Four times a day (QID) | RESPIRATORY_TRACT | 1 refills | Status: AC | PRN
  Filled 2024-09-14: qty 360, 30d supply, fill #0

## 2024-09-14 MED ORDER — FLUTICASONE-SALMETEROL 250-50 MCG/ACT IN AEPB
1.0000 | INHALATION_SPRAY | Freq: Two times a day (BID) | RESPIRATORY_TRACT | 5 refills | Status: AC
  Filled 2024-09-14: qty 60, 30d supply, fill #0

## 2024-09-14 NOTE — Patient Instructions (Addendum)
" °  VISIT SUMMARY: Today, we discussed your chronic bronchitis and COPD, focusing on your recurrent coughing spells and current treatment plan.  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 -You have been prescribed a stronger inhaler to use daily as maintenance therapy. Please rinse your mouth after using the inhaler to prevent thrush. -You have been provided with a nebulizer to use during exacerbations. -A pulmonary function test has been ordered to assess your lung capacity and confirm the COPD diagnosis. -Lab work has been ordered to check for any asthma-related issues contributing to your symptoms. -Please use your inhaler consistently and contact the pharmacy if you have any issues with medication coverage. - request your labs to be done when you come here for PFT    Contains text generated by [employer].   "

## 2024-09-14 NOTE — Progress Notes (Deleted)
 "  New Patient Pulmonology Office Visit   Subjective:  Patient ID: Jacqueline Good, female    DOB: 07-28-1961  MRN: [id number]  Referred by: Jacqueline Millman, PA  CC:  Chief Complaint  Patient presents with   Consult    Coughing spells and reoccurring bronchitis x 10 years.  Coughing occurs every 3-4 months.    HPI Jacqueline Good is a 64 y.o. female with ***  Discussed the use of AI scribe software for clinical note transcription with the patient, who gave verbal consent to proceed.  History of Present Illness      {PULM QUESTIONNAIRES (Optional):33196}  ROS  Allergies: Gadolinium derivatives, Hydrocodone , and Iodinated contrast media Current Medications[1] Past Medical History:  Diagnosis Date   Anemia    Anxiety    Arthritis    all over (08/12/2018)   Asthma    Back pain    Benign paroxysmal positional vertigo 10/31/2014   Breast cancer, right breast (HCC) dx'd 02/2018   Chronic bronchitis (HCC)    Depression    Family history of breast cancer    Family history of prostate cancer    Family history of uterine cancer    GERD (gastroesophageal reflux disease)    History of blood transfusion    several; related to low blood (08/12/2018)   History of radiation therapy 11/15/18- 12/17/18   Right Chest wall, SCV, PAB. 25 fractions.    Lymphedema of right arm    Palpitations    Personal history of chemotherapy    Personal history of radiation therapy    Past Surgical History:  Procedure Laterality Date   AXILLARY LYMPH NODE DISSECTION Right 08/12/2018   Procedure: AXILLARY LYMPH NODE DISSECTION;  Surgeon: Ethyl Lenis, MD;  Location: [facility] OR;  Service: General;  Laterality: Right;   BREAST BIOPSY Right 03/2018   BREAST RECONSTRUCTION WITH PLACEMENT OF TISSUE EXPANDER AND FLEX HD (ACELLULAR HYDRATED DERMIS) Right 08/12/2018   Procedure: RIGHT BREAST RECONSTRUCTION WITH PLACEMENT OF TISSUE EXPANDER AND FLEX HD (ACELLULAR HYDRATED DERMIS);  Surgeon: Lowery Estefana RAMAN,  DO;  Location: [facility] OR;  Service: Plastics;  Laterality: Right;   BREAST REDUCTION SURGERY Left 09/28/2019   Procedure: LEFT MAMMARY REDUCTION/MASTOPEXY  (BREAST);  Surgeon: Lowery Estefana RAMAN, DO;  Location: [facility];  Service: Plastics;  Laterality: Left;   DILATION AND CURETTAGE OF UTERUS     ENDOMETRIAL ABLATION     IR IMAGING GUIDED PORT INSERTION  03/18/2018   MASTECTOMY MODIFIED RADICAL Right 08/12/2018   Procedure: RIGHT MODIFIED RADICAL MASTECTOMY;  Surgeon: Ethyl Lenis, MD;  Location: [facility] OR;  Service: General;  Laterality: Right;   MASTECTOMY MODIFIED RADICAL Right 08/12/2018   w/axillary LND   MYOMECTOMY     REDUCTION MAMMAPLASTY Left 2021   REMOVAL OF TISSUE EXPANDER AND PLACEMENT OF IMPLANT Right 10/26/2018   Procedure: Removal of right breast expander;  Surgeon: Lowery Estefana RAMAN, DO;  Location: [facility] OR;  Service: Plastics;  Laterality: Right;  75 min, please   Family History  Problem Relation Age of Onset   Breast cancer Mother 67   Pancreatic cancer Mother    Lupus Sister    Heart disease Sister    Diabetes Maternal Grandmother    Heart Problems Maternal Grandmother    Prostate cancer Maternal Grandfather    Prostate cancer Paternal Grandfather    Prostate cancer Paternal Uncle    Uterine cancer Maternal Great-grandmother    Prostate cancer Other        MGFs  brother   Breast cancer Other        Mat great-grandmother's sister   Prostate cancer Other        PGFs brother   Social History   Socioeconomic History   Marital status: Single    Spouse name: Not on file   Number of children: Not on file   Years of education: Not on file   Highest education level: Not on file  Occupational History   Not on file  Tobacco Use   Smoking status: Former    Current packs/day: 0.00    Average packs/day: 1 pack/day for 38.0 years (38.0 ttl pk-yrs)    Types: Cigarettes    Start date: 05/15/1980    Quit date: 05/15/2018    Years since quitting: 6.3    Smokeless tobacco: Never  Vaping Use   Vaping status: Never Used  Substance and Sexual Activity   Alcohol use: Not Currently    Comment: occasional drink   Drug use: No   Sexual activity: Not Currently    Birth control/protection: None  Other Topics Concern   Not on file  Social History Narrative   Right Handed   Lives in a one story home   Drinks a cup of coffee every so often    Social Drivers of Health   Tobacco Use: Medium Risk (09/14/2024)   Patient History    Smoking Tobacco Use: Former    Smokeless Tobacco Use: Never    Passive Exposure: Not on Actuary Strain: Not on file  Food Insecurity: Not on file  Transportation Needs: Not on file  Physical Activity: Not on file  Stress: Not on file  Social Connections: Unknown (12/24/2021)   Received from [facility]   Social Network    Social Network: Not on file  Intimate Partner Violence: Unknown (11/15/2021)   Received from [facility]   HITS    Physically Hurt: Not on file    Insult or Talk Down To: Not on file    Threaten Physical Harm: Not on file    Scream or Curse: Not on file  Depression (PHQ2-9): Medium Risk (09/23/2021)   Depression (PHQ2-9)    PHQ-2 Score: 9  Alcohol Screen: Not on file  Housing: Not on file  Utilities: Not on file  Health Literacy: Not on file         Objective:  BP 116/72   Pulse 79   Temp (!) 97.1 F (36.2 C) (Temporal)   Ht 5' 4 (1.626 m)   Wt 176 lb (79.8 kg)   SpO2 98% Comment: room air  BMI 30.21 kg/m  {Pulm Vitals (Optional):32837}  Physical Exam  Diagnostic Review:  {Labs (Optional):32838}  Pft     No data to display               Results       Assessment & Plan:   Assessment & Plan Chronic cough       Thank you for the opportunity to take part in the care of Jacqueline Good   No follow-ups on file.   Fabyan Loughmiller Pleas, MD [facility] Office: [phone]    [1]  Current Outpatient Medications:     albuterol  (VENTOLIN  HFA) 108 (90 Base) MCG/ACT inhaler, 1 puff as needed Inhalation every 4 hrs, Disp: 6.7 g, Rfl: 3   Albuterol  Sulfate (PROAIR  RESPICLICK) 108 (90 Base) MCG/ACT AEPB, Take 2 puffs by mouth every 4 (four) hours., Disp: 1 each,  Rfl: 0   ALPRAZolam  (XANAX ) 1 MG tablet, Take 1 tablet (1 mg total) by mouth in the morning AND 1.5 tablets (1.5 mg total) every evening., Disp: 75 tablet, Rfl: 0   ALPRAZolam  (XANAX ) 1 MG tablet, Take 1 tablet (1 mg total) by mouth in the morning and 1 and 1/2 tablet in the evening, Disp: 75 tablet, Rfl: 1   ALPRAZolam  (XANAX ) 1 MG tablet, Take 1 tablet in AM and 1 and 1/2 tablets in PM (2.5mg /day), Disp: 75 tablet, Rfl: 1   ALPRAZolam  (XANAX ) 1 MG tablet, Take 1 tablet (1 mg total) by mouth in the morning and 1.5 tablet (1.5mg ) every evening, Disp: 75 tablet, Rfl: 1   ALPRAZolam  (XANAX ) 1 MG tablet, Take 1 tablet by mouth in the AM and take 1 and 1/2 tablets in PM (total 2.5mg /day), Disp: 75 tablet, Rfl: 0   ALPRAZolam  (XANAX ) 1 MG tablet, Take 1 tablet (1 mg total) by mouth in the morning AND 1.5 tablets (1.5 mg total) every evening., Disp: 75 tablet, Rfl: 0   buPROPion  (WELLBUTRIN  SR) 150 MG 12 hr tablet, Take 1 tablet by mouth daily., Disp: 30 tablet, Rfl: 0   buPROPion  (WELLBUTRIN  XL) 300 MG 24 hr tablet, Take 1 tablet (300 mg total) by mouth in the morning., Disp: 30 tablet, Rfl: 1   buPROPion  (WELLBUTRIN  XL) 300 MG 24 hr tablet, Take 1 tablet (300 mg total) by mouth in the morning., Disp: 30 tablet, Rfl: 1   buPROPion  (WELLBUTRIN  XL) 300 MG 24 hr tablet, Take 1 tablet (300 mg total) by mouth in the morning., Disp: 30 tablet, Rfl: 1   buPROPion  (WELLBUTRIN  XL) 300 MG 24 hr tablet, Take 1 tablet (300 mg total) by mouth in the morning., Disp: 30 tablet, Rfl: 1   esomeprazole  (NEXIUM ) 40 MG capsule, Take 1 capsule (40 mg total) by mouth daily., Disp: 30 capsule, Rfl: 2   fluticasone -salmeterol (ADVAIR ) 100-50 MCG/ACT AEPB, Inhale 1 puff into the lungs 2 (two)  times daily., Disp: 60 each, Rfl: 4   fluticasone -salmeterol (ADVAIR ) 100-50 MCG/ACT AEPB, Inhale 1 puff into the lungs 2 (two) times daily., Disp: 60 each, Rfl: 3   guaiFENesin -codeine  100-10 MG/5ML syrup, Take 10 mLs by mouth every 6 (six) hours as needed., Disp: 280 mL, Rfl: 0   hydrOXYzine  (ATARAX ) 25 MG tablet, Take 1-2 tablets (25-50 mg total) by mouth daily as needed., Disp: 30 tablet, Rfl: 0   loratadine  (CLARITIN ) 10 MG tablet, Take 1 tablet (10 mg total) by mouth daily as needed for allergies., Disp: 30 tablet, Rfl: 11   lurasidone  (LATUDA ) 40 MG TABS tablet, Take 1 tablet (40 mg total) by mouth every evening with dinner, Disp: 30 tablet, Rfl: 1   lurasidone  (LATUDA ) 40 MG TABS tablet, Take 1 tablet (40 mg total) by mouth every evening with dinner, Disp: 30 tablet, Rfl: 1   lurasidone  (LATUDA ) 40 MG TABS tablet, Take 1 tablet (40 mg total) by mouth daily with dinner., Disp: 30 tablet, Rfl: 1   lurasidone  (LATUDA ) 40 MG TABS tablet, Take 1 tablet (40 mg total) by mouth daily with supper., Disp: 30 tablet, Rfl: 1   Lurasidone  HCl (LATUDA ) 60 MG TABS, Take 1 tablet (60 mg total) by mouth daily in the evening with dinner, Disp: 30 tablet, Rfl: 1   Lurasidone  HCl (LATUDA ) 60 MG TABS, Take 1 tablet (60 mg total) by mouth every evening with dinner, Disp: 30 tablet, Rfl: 1   Lurasidone  HCl (LATUDA ) 60 MG TABS, Take 1  tablet (60 mg total) by mouth every evening with dinner, Disp: 30 tablet, Rfl: 1   meloxicam  (MOBIC ) 15 MG tablet, Take 1 tablet (15 mg total) by mouth daily., Disp: 30 tablet, Rfl: 0   metFORMIN  (GLUCOPHAGE ) 500 MG tablet, Take 1 tablet (500 mg) by mouth 2 times daily with meals., Disp: 60 tablet, Rfl: 0   Multiple Vitamin (MULTIVITAMIN) capsule, Take 1 capsule by mouth daily., Disp: , Rfl:    naloxone  (NARCAN ) nasal spray 4 mg/0.1 mL, Spray 1 dose into ONE nostril every 3 minutes alternate nostrils with each dose until help arrives, Disp: 2 each, Rfl: 1   naloxone  (NARCAN ) nasal  spray 4 mg/0.1 mL, Instill 1 spray every 3 minutes; Spray 1 dose into ONE nostril; alternate nostrils with each dose until help arrives., Disp: 2 each, Rfl: 3   omeprazole  (PRILOSEC) 40 MG capsule, TAKE 1 CAPSULE BY MOUTH DAILY., Disp: 30 capsule, Rfl: 2   oxyCODONE -acetaminophen  (PERCOCET) 10-325 MG tablet, Take 1 tablet by mouth 4 (four) times daily as needed., Disp: 120 tablet, Rfl: 0   oxyCODONE -acetaminophen  (PERCOCET) 10-325 MG tablet, Take 1 tablet by mouth 4 times daily as needed, Disp: 120 tablet, Rfl: 0   oxyCODONE -acetaminophen  (PERCOCET) 10-325 MG tablet, Take 1 tablet by mouth 4 (four) times daily as needed., Disp: 120 tablet, Rfl: 0   oxyCODONE -acetaminophen  (PERCOCET) 10-325 MG tablet, Take 1 tablet by mouth 4 (four) times daily as needed., Disp: 120 tablet, Rfl: 0   oxyCODONE -acetaminophen  (PERCOCET) 10-325 MG tablet, Take 1 tablet by mouth 4 (four) times daily as needed., Disp: 120 tablet, Rfl: 0   oxyCODONE -acetaminophen  (PERCOCET) 10-325 MG tablet, Take 1 tablet by mouth 4 (four) times daily as needed., Disp: 120 tablet, Rfl: 0   oxyCODONE -acetaminophen  (PERCOCET) 10-325 MG tablet, Take 1 tablet by mouth 4 (four) times daily as needed for pain., Disp: 120 tablet, Rfl: 0   oxyCODONE -acetaminophen  (PERCOCET) 10-325 MG tablet, Take 1 tablet by mouth 4 (four) times daily as needed for pain, Disp: 120 tablet, Rfl: 0   oxyCODONE -acetaminophen  (PERCOCET) 10-325 MG tablet, Take 1 tablet by mouth 4 (four) times daily as needed., Disp: 120 tablet, Rfl: 0   oxyCODONE -acetaminophen  (PERCOCET) 10-325 MG tablet, Take 1 tablet by mouth 4 (four) times daily as needed for pain, Disp: 120 tablet, Rfl: 0   phentermine  30 MG capsule, Take 1 capsule (30 mg total) by mouth every morning., Disp: 30 capsule, Rfl: 0   potassium chloride  (KLOR-CON  M) 10 MEQ tablet, Take 1 tablet by mouth with food once daily, Disp: 10 tablet, Rfl: 0   predniSONE  (DELTASONE ) 50 MG tablet, Take 1 tablet (50 mg total) by mouth  daily with food or milk, Disp: 5 tablet, Rfl: 0   promethazine  (PHENERGAN ) 6.25 MG/5ML solution, Take 10 mL by mouth every 6 hours as needed, Disp: 280 mL, Rfl: 0   propranolol  (INDERAL ) 40 MG tablet, Take 40 mg by mouth daily as needed (heart palpitation.)., Disp: , Rfl:    sertraline  (ZOLOFT ) 100 MG tablet, Take 1.5 tablets (150 mg total) by mouth in the morning., Disp: 45 tablet, Rfl: 1   sertraline  (ZOLOFT ) 100 MG tablet, Take 2 tablets (200 mg total) by mouth every morning., Disp: 60 tablet, Rfl: 1   sertraline  (ZOLOFT ) 100 MG tablet, Take 2 tablets (200 mg total) by mouth in the morning., Disp: 60 tablet, Rfl: 0   sertraline  (ZOLOFT ) 50 MG tablet, Take 1 tablet by mouth in the morning for 2 weeks, then increase to 2 tablets in  the morning, Disp: 45 tablet, Rfl: 0   tirzepatide  (MOUNJARO ) 2.5 MG/0.5ML Pen, Inject 2.5 mg into the skin once a week., Disp: 2 mL, Rfl: 0   topiramate  (TOPAMAX ) 50 MG tablet, Take 1.5 tablets (75 mg total) by mouth at bedtime., Disp: 45 tablet, Rfl: 5   venlafaxine  XR (EFFEXOR -XR) 75 MG 24 hr capsule, Take 3 capsules by mouth every morning, Disp: 90 capsule, Rfl: 1   Vitamin D , Ergocalciferol , (DRISDOL ) 1.25 MG (50000 UNIT) CAPS capsule, Take 1 capsule (50,000 Units total) by mouth every 14 (fourteen) days., Disp: 6 capsule, Rfl: 0   zolpidem  (AMBIEN ) 10 MG tablet, Take 1 tablet (10 mg) by mouth at bedtime as needed for sleep., Disp: 30 tablet, Rfl: 2   zolpidem  (AMBIEN ) 10 MG tablet, Take 1 tablet by mouth at bedtime, Disp: 30 tablet, Rfl: 0   zolpidem  (AMBIEN ) 10 MG tablet, Take 1 tablet (10 mg total) by mouth at bedtime., Disp: 30 tablet, Rfl: 2   zolpidem  (AMBIEN ) 10 MG tablet, Take 1 tablet by mouth at bedtime, Disp: 30 tablet, Rfl: 1   zolpidem  (AMBIEN ) 10 MG tablet, Take 1 tablet (10 mg total) by mouth at bedtime., Disp: 30 tablet, Rfl: 1   zolpidem  (AMBIEN ) 10 MG tablet, Take 1 tablet (10 mg total) by mouth at bedtime., Disp: 30 tablet, Rfl: 0   zolpidem   (AMBIEN ) 10 MG tablet, Take 1 tablet (10 mg total) by mouth at bedtime., Disp: 30 tablet, Rfl: 0   guaiFENesin -codeine  100-10 MG/5ML syrup, Take 10 mL by mouth every 6 hours as needed, Disp: 280 mL, Rfl: 0   HYDROcodone -acetaminophen  (NORCO/VICODIN) 5-325 MG tablet, Take 1 tablet by mouth twice a day as needed., Disp: 10 tablet, Rfl: 0  "

## 2024-09-14 NOTE — Progress Notes (Signed)
 "  New Patient Pulmonology Office Visit   Subjective:  Patient ID: Jacqueline Good, female    DOB: 1961/05/09  MRN: [id number]  Referred by: Stacia Millman, PA  CC:  Chief Complaint  Patient presents with   Consult    Coughing spells and reoccurring bronchitis x 10 years.  Coughing occurs every 3-4 months.    Initial clinic visit 09/15/2023. Referred for recurrent bronchitis- 3-4/year likely from COPD Former smoker, quit in 2019 Undergoes low dose lung cancer screening yearly   Jacqueline Good is a 64 y.o. female who is referred to this clinic for COPD.  Discussed the use of AI scribe software for clinical note transcription with the patient, who gave verbal consent to proceed.  History of Present Illness Jacqueline Good is a 64 year old female with chronic bronchitis and COPD who presents with recurrent coughing spells.  She has experienced recurrent coughing spells for several years, with episodes occurring three to four times a year. During these episodes, she experiences daily symptoms. Her last episode was last month, for which she was treated with antibiotics and steroids.  She has been using Advair  intermittently , which she finds somewhat helpful during spells. Despite quitting smoking in 2019 after 40 years, she has not noticed a significant difference in the frequency of her episodes.  No history of hospitalizations or emergency visits for severe breathing issues. She has not been on oxygen therapy before.  Her family history is notable for her brother who passed away from lung cancer and her mother who had breast and pancreatic cancer. Pt has a hx of breast cancer as well  She denies significant allergies or acid reflux disease. She has not undergone a breathing test in the past but had a CT scan in September of the previous year as part of a lung cancer screening program, which showed some emphysema but no concerning nodules.  Her has anxiety and is on xanax ,  wellbutrin , zoloft , ambien      ROS Review of symptoms negative except mentioned above  Allergies: Gadolinium derivatives, Hydrocodone , and Iodinated contrast media Current Medications[1] Past Medical History:  Diagnosis Date   Anemia    Anxiety    Arthritis    all over (08/12/2018)   Asthma    Back pain    Benign paroxysmal positional vertigo 10/31/2014   Breast cancer, right breast (HCC) dx'd 02/2018   Chronic bronchitis (HCC)    Depression    Family history of breast cancer    Family history of prostate cancer    Family history of uterine cancer    GERD (gastroesophageal reflux disease)    History of blood transfusion    several; related to low blood (08/12/2018)   History of radiation therapy 11/15/18- 12/17/18   Right Chest wall, SCV, PAB. 25 fractions.    Lymphedema of right arm    Palpitations    Personal history of chemotherapy    Personal history of radiation therapy    Past Surgical History:  Procedure Laterality Date   AXILLARY LYMPH NODE DISSECTION Right 08/12/2018   Procedure: AXILLARY LYMPH NODE DISSECTION;  Surgeon: Ethyl Lenis, MD;  Location[facility] OR;  Service: General;  Laterality: Right;   BREAST BIOPSY Right 03/2018   BREAST RECONSTRUCTION WITH PLACEMENT OF TISSUE EXPANDER AND FLEX HD (ACELLULAR HYDRATED DERMIS) Right 08/12/2018   Procedure: RIGHT BREAST RECONSTRUCTION WITH PLACEMENT OF TISSUE EXPANDER AND FLEX HD (ACELLULAR HYDRATED DERMIS);  Surgeon: Lowery Estefana RAMAN, DO;  Location: [facility] OR;  Service: Government Social Research Officer;  Laterality: Right;   BREAST REDUCTION SURGERY Left 09/28/2019   Procedure: LEFT MAMMARY REDUCTION/MASTOPEXY  (BREAST);  Surgeon: Lowery Estefana RAMAN, DO;  Location: [facility];  Service: Plastics;  Laterality: Left;   DILATION AND CURETTAGE OF UTERUS     ENDOMETRIAL ABLATION     IR IMAGING GUIDED PORT INSERTION  03/18/2018   MASTECTOMY MODIFIED RADICAL Right 08/12/2018   Procedure: RIGHT MODIFIED RADICAL MASTECTOMY;  Surgeon: Ethyl Lenis, MD;  Location: [facility] OR;  Service: General;  Laterality: Right;   MASTECTOMY MODIFIED RADICAL Right 08/12/2018   w/axillary LND   MYOMECTOMY     REDUCTION MAMMAPLASTY Left 2021   REMOVAL OF TISSUE EXPANDER AND PLACEMENT OF IMPLANT Right 10/26/2018   Procedure: Removal of right breast expander;  Surgeon: Lowery Estefana RAMAN, DO;  Location: [facility] OR;  Service: Plastics;  Laterality: Right;  75 min, please   Family History  Problem Relation Age of Onset   Breast cancer Mother 47   Pancreatic cancer Mother    Lupus Sister    Heart disease Sister    Diabetes Maternal Grandmother    Heart Problems Maternal Grandmother    Prostate cancer Maternal Grandfather    Prostate cancer Paternal Grandfather    Prostate cancer Paternal Uncle    Uterine cancer Maternal Great-grandmother    Prostate cancer Other        MGFs brother   Breast cancer Other        Mat great-grandmother's sister   Prostate cancer Other        PGFs brother   Social History   Socioeconomic History   Marital status: Single    Spouse name: Not on file   Number of children: Not on file   Years of education: Not on file   Highest education level: Not on file  Occupational History   Not on file  Tobacco Use   Smoking status: Former    Current packs/day: 0.00    Average packs/day: 1 pack/day for 38.0 years (38.0 ttl pk-yrs)    Types: Cigarettes    Start date: 05/15/1980    Quit date: 05/15/2018    Years since quitting: 6.3   Smokeless tobacco: Never  Vaping Use   Vaping status: Never Used  Substance and Sexual Activity   Alcohol use: Not Currently    Comment: occasional drink   Drug use: No   Sexual activity: Not Currently    Birth control/protection: None  Other Topics Concern   Not on file  Social History Narrative   Right Handed   Lives in a one story home   Drinks a cup of coffee every so often    Social Drivers of Health   Tobacco Use: Medium Risk (09/14/2024)   Patient History    Smoking Tobacco  Use: Former    Smokeless Tobacco Use: Never    Passive Exposure: Not on Actuary Strain: Not on file  Food Insecurity: Not on file  Transportation Needs: Not on file  Physical Activity: Not on file  Stress: Not on file  Social Connections: Unknown (12/24/2021)   Received from [facility]   Social Network    Social Network: Not on file  Intimate Partner Violence: Unknown (11/15/2021)   Received from [facility]   HITS    Physically Hurt: Not on file    Insult or Talk Down To: Not on file    Threaten Physical Harm: Not on file    Scream or  Curse: Not on file  Depression (PHQ2-9): Medium Risk (09/23/2021)   Depression (PHQ2-9)    PHQ-2 Score: 9  Alcohol Screen: Not on file  Housing: Not on file  Utilities: Not on file  Health Literacy: Not on file         Objective:  BP 116/72   Pulse 79   Temp (!) 97.1 F (36.2 C) (Temporal)   Ht 5' 4 (1.626 m)   Wt 176 lb (79.8 kg)   SpO2 98% Comment: room air  BMI 30.21 kg/m    Physical Exam Constitutional:      General: She is not in acute distress.    Appearance: Normal appearance.     Comments: Pt visibly anxious during the appointment. Was tearful at a point Friend/SO reports she was having a panic attack- Ms Gilkey did calm down somewhat towards the end of visit  HENT:     Mouth/Throat:     Mouth: Mucous membranes are moist.  Cardiovascular:     Rate and Rhythm: Normal rate.  Pulmonary:     Effort: No respiratory distress.     Breath sounds: No wheezing or rales.  Musculoskeletal:     Right lower leg: No edema.     Left lower leg: No edema.  Skin:    General: Skin is warm.  Neurological:     Mental Status: She is alert and oriented to person, place, and time.  Psychiatric:        Mood and Affect: Mood normal.     Diagnostic Review:    Pft     No data to display             Radiology Chest CT (04/2024): Emphysema without suspicious pulmonary nodules (Independently  interpreted)  Results       Assessment & Plan:   Assessment & Plan Chronic obstructive pulmonary disease with emphysema, unspecified emphysema type (HCC) Discussed the symptoms, etiology, pathophysiology, diagnostic test, treatment, flare ups,  prognosis of copd Encouraged pt to take maintenance inhaler daily Will increase the strength of her inhaler- d/w pt I advised pt to rinse mouth after inhaler use Will prescribe a nebulizer and duonebs and advised pt to use this during flare ups Orders:   IgE; Future   CBC with Differential/Platelet; Future   Pulmonary function test; Future   ipratropium-albuterol  (DUONEB) 0.5-2.5 (3) MG/3ML SOLN; Take 3 mLs by nebulization every 6 (six) hours as needed.   fluticasone -salmeterol (ADVAIR  DISKUS) 250-50 MCG/ACT AEPB; Inhale 1 puff into the lungs in the morning and at bedtime.   Ambulatory Referral for DME  Other asthma Given repeated flare ups despite quitting smoking will r/o asthma-copd overlap Orders:   IgE; Future   CBC with Differential/Platelet; Future   Pulmonary function test; Future   Ambulatory Referral for DME  Former smoker Congratulated on quitting smoking On low dose lung screening program through PCP We can take over in future if desired Next ct chest 04/2025    GAD (generalized anxiety disorder) Pt had severe anxiety during the visit. Her SO/friend tried to calm her down. He stated pt had other stressors/reasons ongoing but was a relief for them to hear that much damage wasn't seen on ct chest      Thank you for the opportunity to take part in the care of Fariha L Eardley   Return in about 2 months (around 11/12/2024).   Micheal Sheen Pleas, MD [facility] Office: [phone]    [1]  Current Outpatient Medications:  albuterol  (VENTOLIN  HFA) 108 (90 Base) MCG/ACT inhaler, 1 puff as needed Inhalation every 4 hrs, Disp: 6.7 g, Rfl: 3   Albuterol  Sulfate (PROAIR  RESPICLICK) 108 (90 Base) MCG/ACT  AEPB, Take 2 puffs by mouth every 4 (four) hours., Disp: 1 each, Rfl: 0   ALPRAZolam  (XANAX ) 1 MG tablet, Take 1 tablet (1 mg total) by mouth in the morning AND 1.5 tablets (1.5 mg total) every evening., Disp: 75 tablet, Rfl: 0   ALPRAZolam  (XANAX ) 1 MG tablet, Take 1 tablet (1 mg total) by mouth in the morning and 1 and 1/2 tablet in the evening, Disp: 75 tablet, Rfl: 1   ALPRAZolam  (XANAX ) 1 MG tablet, Take 1 tablet in AM and 1 and 1/2 tablets in PM (2.5mg /day), Disp: 75 tablet, Rfl: 1   ALPRAZolam  (XANAX ) 1 MG tablet, Take 1 tablet (1 mg total) by mouth in the morning and 1.5 tablet (1.5mg ) every evening, Disp: 75 tablet, Rfl: 1   ALPRAZolam  (XANAX ) 1 MG tablet, Take 1 tablet by mouth in the AM and take 1 and 1/2 tablets in PM (total 2.5mg /day), Disp: 75 tablet, Rfl: 0   ALPRAZolam  (XANAX ) 1 MG tablet, Take 1 tablet (1 mg total) by mouth in the morning AND 1.5 tablets (1.5 mg total) every evening., Disp: 75 tablet, Rfl: 0   buPROPion  (WELLBUTRIN  SR) 150 MG 12 hr tablet, Take 1 tablet by mouth daily., Disp: 30 tablet, Rfl: 0   buPROPion  (WELLBUTRIN  XL) 300 MG 24 hr tablet, Take 1 tablet (300 mg total) by mouth in the morning., Disp: 30 tablet, Rfl: 1   buPROPion  (WELLBUTRIN  XL) 300 MG 24 hr tablet, Take 1 tablet (300 mg total) by mouth in the morning., Disp: 30 tablet, Rfl: 1   buPROPion  (WELLBUTRIN  XL) 300 MG 24 hr tablet, Take 1 tablet (300 mg total) by mouth in the morning., Disp: 30 tablet, Rfl: 1   buPROPion  (WELLBUTRIN  XL) 300 MG 24 hr tablet, Take 1 tablet (300 mg total) by mouth in the morning., Disp: 30 tablet, Rfl: 1   esomeprazole  (NEXIUM ) 40 MG capsule, Take 1 capsule (40 mg total) by mouth daily., Disp: 30 capsule, Rfl: 2   fluticasone -salmeterol (ADVAIR  DISKUS) 250-50 MCG/ACT AEPB, Inhale 1 puff into the lungs in the morning and at bedtime., Disp: 60 each, Rfl: 5   guaiFENesin -codeine  100-10 MG/5ML syrup, Take 10 mLs by mouth every 6 (six) hours as needed., Disp: 280 mL, Rfl: 0    hydrOXYzine  (ATARAX ) 25 MG tablet, Take 1-2 tablets (25-50 mg total) by mouth daily as needed., Disp: 30 tablet, Rfl: 0   ipratropium-albuterol  (DUONEB) 0.5-2.5 (3) MG/3ML SOLN, Take 3 mLs by nebulization every 6 (six) hours as needed., Disp: 360 mL, Rfl: 1   loratadine  (CLARITIN ) 10 MG tablet, Take 1 tablet (10 mg total) by mouth daily as needed for allergies., Disp: 30 tablet, Rfl: 11   lurasidone  (LATUDA ) 40 MG TABS tablet, Take 1 tablet (40 mg total) by mouth every evening with dinner, Disp: 30 tablet, Rfl: 1   lurasidone  (LATUDA ) 40 MG TABS tablet, Take 1 tablet (40 mg total) by mouth every evening with dinner, Disp: 30 tablet, Rfl: 1   lurasidone  (LATUDA ) 40 MG TABS tablet, Take 1 tablet (40 mg total) by mouth daily with dinner., Disp: 30 tablet, Rfl: 1   lurasidone  (LATUDA ) 40 MG TABS tablet, Take 1 tablet (40 mg total) by mouth daily with supper., Disp: 30 tablet, Rfl: 1   Lurasidone  HCl (LATUDA ) 60 MG TABS, Take 1 tablet (60  mg total) by mouth daily in the evening with dinner, Disp: 30 tablet, Rfl: 1   Lurasidone  HCl (LATUDA ) 60 MG TABS, Take 1 tablet (60 mg total) by mouth every evening with dinner, Disp: 30 tablet, Rfl: 1   Lurasidone  HCl (LATUDA ) 60 MG TABS, Take 1 tablet (60 mg total) by mouth every evening with dinner, Disp: 30 tablet, Rfl: 1   meloxicam  (MOBIC ) 15 MG tablet, Take 1 tablet (15 mg total) by mouth daily., Disp: 30 tablet, Rfl: 0   metFORMIN  (GLUCOPHAGE ) 500 MG tablet, Take 1 tablet (500 mg) by mouth 2 times daily with meals., Disp: 60 tablet, Rfl: 0   Multiple Vitamin (MULTIVITAMIN) capsule, Take 1 capsule by mouth daily., Disp: , Rfl:    naloxone  (NARCAN ) nasal spray 4 mg/0.1 mL, Spray 1 dose into ONE nostril every 3 minutes alternate nostrils with each dose until help arrives, Disp: 2 each, Rfl: 1   naloxone  (NARCAN ) nasal spray 4 mg/0.1 mL, Instill 1 spray every 3 minutes; Spray 1 dose into ONE nostril; alternate nostrils with each dose until help arrives., Disp: 2 each,  Rfl: 3   omeprazole  (PRILOSEC) 40 MG capsule, TAKE 1 CAPSULE BY MOUTH DAILY., Disp: 30 capsule, Rfl: 2   oxyCODONE -acetaminophen  (PERCOCET) 10-325 MG tablet, Take 1 tablet by mouth 4 (four) times daily as needed., Disp: 120 tablet, Rfl: 0   oxyCODONE -acetaminophen  (PERCOCET) 10-325 MG tablet, Take 1 tablet by mouth 4 times daily as needed, Disp: 120 tablet, Rfl: 0   oxyCODONE -acetaminophen  (PERCOCET) 10-325 MG tablet, Take 1 tablet by mouth 4 (four) times daily as needed., Disp: 120 tablet, Rfl: 0   oxyCODONE -acetaminophen  (PERCOCET) 10-325 MG tablet, Take 1 tablet by mouth 4 (four) times daily as needed., Disp: 120 tablet, Rfl: 0   oxyCODONE -acetaminophen  (PERCOCET) 10-325 MG tablet, Take 1 tablet by mouth 4 (four) times daily as needed., Disp: 120 tablet, Rfl: 0   oxyCODONE -acetaminophen  (PERCOCET) 10-325 MG tablet, Take 1 tablet by mouth 4 (four) times daily as needed., Disp: 120 tablet, Rfl: 0   oxyCODONE -acetaminophen  (PERCOCET) 10-325 MG tablet, Take 1 tablet by mouth 4 (four) times daily as needed for pain., Disp: 120 tablet, Rfl: 0   oxyCODONE -acetaminophen  (PERCOCET) 10-325 MG tablet, Take 1 tablet by mouth 4 (four) times daily as needed for pain, Disp: 120 tablet, Rfl: 0   oxyCODONE -acetaminophen  (PERCOCET) 10-325 MG tablet, Take 1 tablet by mouth 4 (four) times daily as needed., Disp: 120 tablet, Rfl: 0   oxyCODONE -acetaminophen  (PERCOCET) 10-325 MG tablet, Take 1 tablet by mouth 4 (four) times daily as needed for pain, Disp: 120 tablet, Rfl: 0   phentermine  30 MG capsule, Take 1 capsule (30 mg total) by mouth every morning., Disp: 30 capsule, Rfl: 0   potassium chloride  (KLOR-CON  M) 10 MEQ tablet, Take 1 tablet by mouth with food once daily, Disp: 10 tablet, Rfl: 0   predniSONE  (DELTASONE ) 50 MG tablet, Take 1 tablet (50 mg total) by mouth daily with food or milk, Disp: 5 tablet, Rfl: 0   promethazine  (PHENERGAN ) 6.25 MG/5ML solution, Take 10 mL by mouth every 6 hours as needed, Disp: 280  mL, Rfl: 0   propranolol  (INDERAL ) 40 MG tablet, Take 40 mg by mouth daily as needed (heart palpitation.)., Disp: , Rfl:    sertraline  (ZOLOFT ) 100 MG tablet, Take 1.5 tablets (150 mg total) by mouth in the morning., Disp: 45 tablet, Rfl: 1   sertraline  (ZOLOFT ) 100 MG tablet, Take 2 tablets (200 mg total) by mouth every morning., Disp: 60  tablet, Rfl: 1   sertraline  (ZOLOFT ) 100 MG tablet, Take 2 tablets (200 mg total) by mouth in the morning., Disp: 60 tablet, Rfl: 0   sertraline  (ZOLOFT ) 50 MG tablet, Take 1 tablet by mouth in the morning for 2 weeks, then increase to 2 tablets in the morning, Disp: 45 tablet, Rfl: 0   tirzepatide  (MOUNJARO ) 2.5 MG/0.5ML Pen, Inject 2.5 mg into the skin once a week., Disp: 2 mL, Rfl: 0   topiramate  (TOPAMAX ) 50 MG tablet, Take 1.5 tablets (75 mg total) by mouth at bedtime., Disp: 45 tablet, Rfl: 5   venlafaxine  XR (EFFEXOR -XR) 75 MG 24 hr capsule, Take 3 capsules by mouth every morning, Disp: 90 capsule, Rfl: 1   Vitamin D , Ergocalciferol , (DRISDOL ) 1.25 MG (50000 UNIT) CAPS capsule, Take 1 capsule (50,000 Units total) by mouth every 14 (fourteen) days., Disp: 6 capsule, Rfl: 0   zolpidem  (AMBIEN ) 10 MG tablet, Take 1 tablet (10 mg) by mouth at bedtime as needed for sleep., Disp: 30 tablet, Rfl: 2   zolpidem  (AMBIEN ) 10 MG tablet, Take 1 tablet by mouth at bedtime, Disp: 30 tablet, Rfl: 0   zolpidem  (AMBIEN ) 10 MG tablet, Take 1 tablet (10 mg total) by mouth at bedtime., Disp: 30 tablet, Rfl: 2   zolpidem  (AMBIEN ) 10 MG tablet, Take 1 tablet by mouth at bedtime, Disp: 30 tablet, Rfl: 1   zolpidem  (AMBIEN ) 10 MG tablet, Take 1 tablet (10 mg total) by mouth at bedtime., Disp: 30 tablet, Rfl: 1   zolpidem  (AMBIEN ) 10 MG tablet, Take 1 tablet (10 mg total) by mouth at bedtime., Disp: 30 tablet, Rfl: 0   zolpidem  (AMBIEN ) 10 MG tablet, Take 1 tablet (10 mg total) by mouth at bedtime., Disp: 30 tablet, Rfl: 0   guaiFENesin -codeine  100-10 MG/5ML syrup, Take 10 mL by  mouth every 6 hours as needed, Disp: 280 mL, Rfl: 0   HYDROcodone -acetaminophen  (NORCO/VICODIN) 5-325 MG tablet, Take 1 tablet by mouth twice a day as needed., Disp: 10 tablet, Rfl: 0  "

## 2024-09-15 ENCOUNTER — Other Ambulatory Visit (HOSPITAL_COMMUNITY): Payer: Self-pay

## 2024-09-15 ENCOUNTER — Other Ambulatory Visit: Payer: Self-pay

## 2024-09-15 LAB — CBC WITH DIFFERENTIAL/PLATELET
Basophils Absolute: 0 10*3/uL (ref 0.0–0.1)
Basophils Relative: 0.7 % (ref 0.0–3.0)
Eosinophils Absolute: 0 10*3/uL (ref 0.0–0.7)
Eosinophils Relative: 0.4 % (ref 0.0–5.0)
HCT: 42 % (ref 36.0–46.0)
Hemoglobin: 14.2 g/dL (ref 12.0–15.0)
Lymphocytes Relative: 38.9 % (ref 12.0–46.0)
Lymphs Abs: 2.2 10*3/uL (ref 0.7–4.0)
MCHC: 33.8 g/dL (ref 30.0–36.0)
MCV: 86.3 fl (ref 78.0–100.0)
Monocytes Absolute: 0.4 10*3/uL (ref 0.1–1.0)
Monocytes Relative: 6.5 % (ref 3.0–12.0)
Neutro Abs: 3.1 10*3/uL (ref 1.4–7.7)
Neutrophils Relative %: 53.5 % (ref 43.0–77.0)
Platelets: 215 10*3/uL (ref 150.0–400.0)
RBC: 4.87 Mil/uL (ref 3.87–5.11)
RDW: 13.2 % (ref 11.5–15.5)
WBC: 5.7 10*3/uL (ref 4.0–10.5)

## 2024-09-15 LAB — IGE: IgE (Immunoglobulin E), Serum: 19 kU/L

## 2024-09-15 MED ORDER — BUPROPION HCL ER (XL) 300 MG PO TB24
300.0000 mg | ORAL_TABLET | Freq: Every morning | ORAL | 0 refills | Status: AC
  Filled 2024-09-15 (×2): qty 90, 90d supply, fill #0

## 2024-09-15 MED ORDER — ZOLPIDEM TARTRATE 10 MG PO TABS: 10.0000 mg | ORAL_TABLET | Freq: Every day | ORAL | 0 refills | Status: AC

## 2024-09-15 MED ORDER — VRAYLAR 1.5 MG PO CAPS
1.5000 mg | ORAL_CAPSULE | Freq: Every day | ORAL | 1 refills | Status: AC
  Filled 2024-09-15 (×2): qty 30, 30d supply, fill #0

## 2024-09-15 MED ORDER — ALPRAZOLAM 1 MG PO TABS: ORAL_TABLET | ORAL | 0 refills | Status: AC

## 2024-09-16 ENCOUNTER — Other Ambulatory Visit: Payer: Self-pay | Admitting: Physician Assistant

## 2024-09-16 ENCOUNTER — Other Ambulatory Visit: Payer: Self-pay

## 2024-09-16 DIAGNOSIS — N644 Mastodynia: Secondary | ICD-10-CM

## 2024-09-22 ENCOUNTER — Encounter

## 2024-09-22 ENCOUNTER — Other Ambulatory Visit

## 2024-12-05 ENCOUNTER — Ambulatory Visit

## 2024-12-05 ENCOUNTER — Encounter

## 2025-01-20 ENCOUNTER — Other Ambulatory Visit

## 2025-01-20 ENCOUNTER — Inpatient Hospital Stay

## 2025-01-20 ENCOUNTER — Ambulatory Visit: Admitting: Adult Health
